# Patient Record
Sex: Male | Born: 1965 | Race: White | Hispanic: No | Marital: Married | Smoking: Never smoker
Health system: Southern US, Community
[De-identification: ages and names within clinical notes are randomized; demographics above are authoritative.]

## PROBLEM LIST (undated history)

## (undated) DIAGNOSIS — E669 Obesity, unspecified: Secondary | ICD-10-CM

## (undated) DIAGNOSIS — I10 Essential (primary) hypertension: Secondary | ICD-10-CM

## (undated) DIAGNOSIS — I5022 Chronic systolic (congestive) heart failure: Secondary | ICD-10-CM

## (undated) DIAGNOSIS — I480 Paroxysmal atrial fibrillation: Secondary | ICD-10-CM

## (undated) DIAGNOSIS — I428 Other cardiomyopathies: Secondary | ICD-10-CM

## (undated) DIAGNOSIS — G4733 Obstructive sleep apnea (adult) (pediatric): Secondary | ICD-10-CM

## (undated) HISTORY — PX: SPLENECTOMY: SUR1306

## (undated) HISTORY — DX: Essential (primary) hypertension: I10

## (undated) HISTORY — DX: Chronic systolic (congestive) heart failure: I50.22

## (undated) HISTORY — DX: Obstructive sleep apnea (adult) (pediatric): G47.33

## (undated) HISTORY — DX: Paroxysmal atrial fibrillation: I48.0

## (undated) HISTORY — PX: APPENDECTOMY: SHX54

## (undated) HISTORY — DX: Other cardiomyopathies: I42.8

## (undated) HISTORY — PX: LIPOMA RESECTION: SHX23

## (undated) HISTORY — DX: Obesity, unspecified: E66.9

## (undated) HISTORY — PX: CARDIAC CATHETERIZATION: SHX172

---

## 2005-05-18 ENCOUNTER — Encounter: Admission: RE | Admit: 2005-05-18 | Discharge: 2005-05-18 | Payer: Self-pay | Admitting: Family Medicine

## 2008-10-29 ENCOUNTER — Emergency Department (HOSPITAL_COMMUNITY): Admission: EM | Admit: 2008-10-29 | Discharge: 2008-10-29 | Payer: Self-pay | Admitting: Emergency Medicine

## 2008-11-20 DIAGNOSIS — I10 Essential (primary) hypertension: Secondary | ICD-10-CM | POA: Insufficient documentation

## 2008-11-21 ENCOUNTER — Ambulatory Visit: Payer: Self-pay | Admitting: Cardiovascular Disease

## 2008-11-21 DIAGNOSIS — R079 Chest pain, unspecified: Secondary | ICD-10-CM

## 2008-12-08 ENCOUNTER — Encounter: Payer: Self-pay | Admitting: Cardiovascular Disease

## 2008-12-08 ENCOUNTER — Ambulatory Visit: Payer: Self-pay | Admitting: Cardiovascular Disease

## 2008-12-08 ENCOUNTER — Ambulatory Visit (HOSPITAL_COMMUNITY): Admission: RE | Admit: 2008-12-08 | Discharge: 2008-12-08 | Payer: Self-pay

## 2008-12-08 ENCOUNTER — Ambulatory Visit: Payer: Self-pay

## 2009-01-10 ENCOUNTER — Ambulatory Visit: Payer: Self-pay | Admitting: Cardiology

## 2009-01-10 ENCOUNTER — Encounter: Payer: Self-pay | Admitting: Physician Assistant

## 2009-01-10 DIAGNOSIS — R9389 Abnormal findings on diagnostic imaging of other specified body structures: Secondary | ICD-10-CM

## 2009-01-17 LAB — CONVERTED CEMR LAB
BUN: 13 mg/dL
CO2: 28 meq/L
Calcium: 8.9 mg/dL
Chloride: 108 meq/L
Creatinine, Ser: 0.9 mg/dL
GFR calc non Af Amer: 97.15 mL/min
Glucose, Bld: 99 mg/dL
Potassium: 3.7 meq/L
Sodium: 142 meq/L

## 2009-01-18 ENCOUNTER — Ambulatory Visit: Payer: Self-pay | Admitting: Cardiovascular Disease

## 2009-01-18 ENCOUNTER — Ambulatory Visit (HOSPITAL_COMMUNITY): Admission: RE | Admit: 2009-01-18 | Discharge: 2009-01-18 | Payer: Self-pay | Admitting: Cardiovascular Disease

## 2009-02-12 ENCOUNTER — Encounter (INDEPENDENT_AMBULATORY_CARE_PROVIDER_SITE_OTHER): Payer: Self-pay | Admitting: *Deleted

## 2010-01-23 ENCOUNTER — Inpatient Hospital Stay (HOSPITAL_COMMUNITY)
Admission: EM | Admit: 2010-01-23 | Discharge: 2010-01-25 | Payer: Self-pay | Source: Home / Self Care | Admitting: Emergency Medicine

## 2010-01-23 ENCOUNTER — Ambulatory Visit: Payer: Self-pay | Admitting: Cardiology

## 2010-01-23 ENCOUNTER — Ambulatory Visit: Payer: Self-pay | Admitting: Family Medicine

## 2010-01-23 ENCOUNTER — Encounter: Payer: Self-pay | Admitting: Family Medicine

## 2010-01-23 DIAGNOSIS — R000 Tachycardia, unspecified: Secondary | ICD-10-CM

## 2010-01-23 DIAGNOSIS — R0602 Shortness of breath: Secondary | ICD-10-CM

## 2010-01-25 ENCOUNTER — Inpatient Hospital Stay (HOSPITAL_COMMUNITY): Admission: EM | Admit: 2010-01-25 | Discharge: 2010-01-29 | Payer: Self-pay | Admitting: Cardiology

## 2010-01-25 ENCOUNTER — Ambulatory Visit: Payer: Self-pay | Admitting: Cardiology

## 2010-01-28 ENCOUNTER — Encounter: Payer: Self-pay | Admitting: Cardiovascular Disease

## 2010-02-01 ENCOUNTER — Telehealth: Payer: Self-pay | Admitting: Cardiovascular Disease

## 2010-02-04 ENCOUNTER — Telehealth: Payer: Self-pay | Admitting: Cardiovascular Disease

## 2010-02-05 ENCOUNTER — Ambulatory Visit: Payer: Self-pay | Admitting: Cardiovascular Disease

## 2010-02-06 LAB — CONVERTED CEMR LAB
CO2: 27 meq/L (ref 19–32)
Calcium: 8.8 mg/dL (ref 8.4–10.5)
Chloride: 107 meq/L (ref 96–112)
Pro B Natriuretic peptide (BNP): 784.9 pg/mL — ABNORMAL HIGH (ref 0.0–100.0)
Sodium: 140 meq/L (ref 135–145)

## 2010-02-07 ENCOUNTER — Telehealth: Payer: Self-pay | Admitting: Cardiovascular Disease

## 2010-02-07 DIAGNOSIS — I4892 Unspecified atrial flutter: Secondary | ICD-10-CM | POA: Insufficient documentation

## 2010-02-08 ENCOUNTER — Ambulatory Visit: Payer: Self-pay | Admitting: Cardiovascular Disease

## 2010-02-08 DIAGNOSIS — I509 Heart failure, unspecified: Secondary | ICD-10-CM | POA: Insufficient documentation

## 2010-02-10 LAB — CONVERTED CEMR LAB
GFR calc non Af Amer: 75.9 mL/min (ref >60)
Glucose, Bld: 61 mg/dL — ABNORMAL LOW (ref 70–99)
Potassium: 3.3 meq/L — ABNORMAL LOW (ref 3.5–5.1)
Sodium: 140 meq/L (ref 135–145)

## 2010-02-12 ENCOUNTER — Telehealth: Payer: Self-pay | Admitting: Cardiovascular Disease

## 2010-03-08 ENCOUNTER — Ambulatory Visit: Payer: Self-pay | Admitting: Cardiovascular Disease

## 2010-03-08 LAB — CONVERTED CEMR LAB
CO2: 33 meq/L — ABNORMAL HIGH (ref 19–32)
Calcium: 9.9 mg/dL (ref 8.4–10.5)
Chloride: 97 meq/L (ref 96–112)
Creatinine, Ser: 1.1 mg/dL (ref 0.4–1.5)
Glucose, Bld: 110 mg/dL — ABNORMAL HIGH (ref 70–99)

## 2010-03-12 ENCOUNTER — Telehealth: Payer: Self-pay | Admitting: Cardiovascular Disease

## 2010-03-15 ENCOUNTER — Other Ambulatory Visit: Payer: Self-pay | Admitting: Cardiology

## 2010-03-15 ENCOUNTER — Ambulatory Visit: Admission: RE | Admit: 2010-03-15 | Discharge: 2010-03-15 | Payer: Self-pay | Source: Home / Self Care

## 2010-03-15 LAB — BASIC METABOLIC PANEL
BUN: 29 mg/dL — ABNORMAL HIGH (ref 6–23)
CO2: 33 mEq/L — ABNORMAL HIGH (ref 19–32)
Calcium: 9.3 mg/dL (ref 8.4–10.5)
Chloride: 94 mEq/L — ABNORMAL LOW (ref 96–112)
Creatinine, Ser: 1.3 mg/dL (ref 0.4–1.5)
GFR: 64.36 mL/min (ref >60.00)
Glucose, Bld: 116 mg/dL — ABNORMAL HIGH (ref 70–99)
Potassium: 2.7 mEq/L — CL (ref 3.5–5.1)
Sodium: 139 mEq/L (ref 135–145)

## 2010-03-15 LAB — MAGNESIUM: Magnesium: 2.2 mg/dL (ref 1.5–2.5)

## 2010-03-19 ENCOUNTER — Ambulatory Visit
Admission: RE | Admit: 2010-03-19 | Discharge: 2010-03-19 | Payer: Self-pay | Source: Home / Self Care | Attending: Cardiovascular Disease | Admitting: Cardiovascular Disease

## 2010-03-19 ENCOUNTER — Other Ambulatory Visit: Payer: Self-pay | Admitting: Cardiovascular Disease

## 2010-03-19 LAB — BASIC METABOLIC PANEL
BUN: 28 mg/dL — ABNORMAL HIGH (ref 6–23)
CO2: 33 mEq/L — ABNORMAL HIGH (ref 19–32)
Calcium: 9.7 mg/dL (ref 8.4–10.5)
Chloride: 98 mEq/L (ref 96–112)
Creatinine, Ser: 1.1 mg/dL (ref 0.4–1.5)
GFR: 74.31 mL/min (ref >60.00)
Glucose, Bld: 84 mg/dL (ref 70–99)
Potassium: 3.4 mEq/L — ABNORMAL LOW (ref 3.5–5.1)
Sodium: 140 mEq/L (ref 135–145)

## 2010-04-01 ENCOUNTER — Telehealth: Payer: Self-pay | Admitting: Cardiovascular Disease

## 2010-04-08 ENCOUNTER — Ambulatory Visit
Admission: RE | Admit: 2010-04-08 | Discharge: 2010-04-08 | Payer: Self-pay | Source: Home / Self Care | Attending: Physician Assistant | Admitting: Physician Assistant

## 2010-04-08 ENCOUNTER — Telehealth (INDEPENDENT_AMBULATORY_CARE_PROVIDER_SITE_OTHER): Payer: Self-pay | Admitting: *Deleted

## 2010-04-08 ENCOUNTER — Other Ambulatory Visit: Payer: Self-pay | Admitting: Physician Assistant

## 2010-04-08 ENCOUNTER — Encounter: Payer: Self-pay | Admitting: Physician Assistant

## 2010-04-08 DIAGNOSIS — R5381 Other malaise: Secondary | ICD-10-CM | POA: Insufficient documentation

## 2010-04-08 DIAGNOSIS — R5383 Other fatigue: Secondary | ICD-10-CM

## 2010-04-08 DIAGNOSIS — I509 Heart failure, unspecified: Secondary | ICD-10-CM | POA: Insufficient documentation

## 2010-04-08 LAB — BASIC METABOLIC PANEL
CO2: 33 mEq/L — ABNORMAL HIGH (ref 19–32)
Chloride: 93 mEq/L — ABNORMAL LOW (ref 96–112)
Creatinine, Ser: 1.2 mg/dL (ref 0.4–1.5)
Potassium: 2.6 mEq/L — CL (ref 3.5–5.1)

## 2010-04-08 LAB — CBC WITH DIFFERENTIAL/PLATELET
Basophils Relative: 0.6 % (ref 0.0–3.0)
Eosinophils Relative: 4.9 % (ref 0.0–5.0)
HCT: 43.3 % (ref 39.0–52.0)
Hemoglobin: 14.9 g/dL (ref 13.0–17.0)
Lymphs Abs: 4 10*3/uL (ref 0.7–4.0)
MCV: 85.2 fl (ref 78.0–100.0)
Monocytes Absolute: 1.7 10*3/uL — ABNORMAL HIGH (ref 0.1–1.0)
Neutro Abs: 10.2 10*3/uL — ABNORMAL HIGH (ref 1.4–7.7)
RBC: 5.07 Mil/uL (ref 4.22–5.81)
WBC: 16.8 10*3/uL — ABNORMAL HIGH (ref 4.5–10.5)

## 2010-04-09 NOTE — Assessment & Plan Note (Signed)
Summary: ROV/DM   History of Present Illness: Art is seen post hospital D/C for new diagnosis of DCM with CHF.  He presented with PAF and CHF.  Cath showed no CAD.  LVEDP was very elevated at 31 with PA pressure of 44/27 and PCWP of 24.  Last office visit had progressive CHF and zaroxyln added with impressive 19 lb weight loss and resolution of dyspnea, PND and orthopnea.  Needs F/U BMET and BNP today.  Will then decide on continuing lasix with zaroxyln 3x/week or changing to demedex.  Keep BB and ACE same for now.  There is a family history of amyloid.  Echo not characteristic but will screen for this with F/U MRI in 3 months.    Current Problems (verified): 1)  CHF  (ICD-428.0) 2)  Unspecified Tachycardia  (ICD-785.0) 3)  Chest Pain Unspecified  (ICD-786.50) 4)  Hypertension  (ICD-401.9) 5)  Atrial Flutter  (ICD-427.32) 6)  Dyspnea  (ICD-786.05) 7)  Abnormal Echocardiogram  (ICD-793.2)  Current Medications (verified): 1)  Aspirin Ec 325 Mg Tbec (Aspirin) .... Take One Tablet By Mouth Daily 2)  Furosemide 40 Mg Tabs (Furosemide) .... Take One Tablet By Mouth Two Times A Day 3)  Carvedilol 25 Mg Tabs (Carvedilol) .... Take One Tablet By Mouth Twice A Day 4)  Cozaar 25 Mg Tabs (Losartan Potassium) .Marland Kitchen.. 1 Tab By Mouth Once Daily 5)  Benzonatate 100 Mg Caps (Benzonatate) .... As Needed 6)  Ibuprofen 200 Mg Tabs (Ibuprofen) .... As Needed 7)  Klor-Con M20 20 Meq Cr-Tabs (Potassium Chloride Crys Cr) .... One Tablet By Mouth Once Daily 8)  Zaroxolyn 5 Mg Tabs (Metolazone) .... One Tablet By Mouth 30 Min Prior To Furosemide Once Daily  Allergies (verified): No Known Drug Allergies  Past History:  Past Medical History: Last updated: 02/05/2010 HTN Chest Pain Blood transfusion Chronic Bronchitis  Past Surgical History: Last updated: 02/07/2010  A 6-French four-curved Judkins right and left coronary catheters, 6-French pigtail ventriculographic catheter, 7-French Swan-  Ganz  thermodilution catheter with subsequent Angio-Seal of the right  femoral artery.  SNT/MEDQ  D:  01/28/2010  T:  01/29/2010  Job:  [id number]  cc:   Noralyn Pick. Eden Emms, MD, [facility]  Electronically Signed by Roger Shelter M.D. 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   Vital Signs:  Patient profile:   45 year old male Height:      76 inches Weight:      281 pounds BMI:     34.33 Pulse rate:   90 / minute Resp:     14 per minute BP sitting:   105 / 71  (left arm)  Vitals Entered By: Kem Parkinson (February 08, 2010 10:00 AM)  Physical Exam  General:  Affect appropriate Healthy:  appears stated age HEENT: normal Neck supple with no adenopathy JVP normal no bruits no thyromegaly Lungs clear with no wheezing and good diaphragmatic motion Heart:  S1/S2 no murmur,rub, gallop or click PMI normal Abdomen: benighn, BS positve, no tenderness, no AAA no bruit.  No HSM or HJR Distal  pulses intact with no bruits No edema Neuro non-focal Skin warm and dry    Impression & Recommendations:  Problem # 1:  CHF (ICD-428.0) Improved  labs today and then decide on diuretic regiman His updated medication list for this problem includes:    Aspirin Ec 325 Mg Tbec (Aspirin) .Marland Kitchen... Take one tablet by mouth daily    Furosemide 40 Mg Tabs (Furosemide) .Marland Kitchen... Take one tablet by mouth two times a day    Carvedilol 25 Mg Tabs (Carvedilol) .Marland Kitchen... Take one tablet by mouth twice a day    Cozaar 25  Mg Tabs (Losartan potassium) .Marland Kitchen... 1 tab by mouth once daily    Zaroxolyn 5 Mg Tabs (Metolazone) ..... One tablet by mouth 30 min prior to furosemide once daily  Orders: TLB-BNP (B-Natriuretic Peptide) (83880-BNPR) TLB-BMP (Basic Metabolic Panel-BMET) (80048-METABOL)  Problem # 2:  HYPERTENSION (ICD-401.9) Resolved no low on Rx for CHF His updated medication list for this problem includes:    Aspirin Ec 325 Mg Tbec (Aspirin) .Marland Kitchen... Take one tablet by mouth daily    Furosemide 40 Mg Tabs (Furosemide) .Marland Kitchen... Take one tablet by mouth two times a day    Carvedilol 25 Mg Tabs (Carvedilol) .Marland Kitchen... Take one tablet by mouth twice a day    Cozaar 25 Mg Tabs (Losartan potassium) .Marland Kitchen... 1 tab by mouth once daily    Zaroxolyn 5 Mg Tabs (Metolazone) ..... One tablet by mouth 30 min prior to furosemide once daily  Problem # 3:  ATRIAL FLUTTER (ICD-427.32) Maint NSR continue ASA and BB His updated medication list for this problem includes:    Aspirin Ec 325 Mg Tbec (Aspirin) .Marland Kitchen... Take one tablet by mouth daily    Carvedilol 25 Mg Tabs (Carvedilol) .Marland Kitchen... Take one tablet by mouth twice a day  Patient Instructions: 1)  Your physician recommends that you schedule a follow-up appointment in: 4 WEEKS WITH DR Laurence Slate 2)  Your physician recommends that you return for lab work in: TODAY BMET BNP 428.0 3)  Your physician recommends that you continue on your current medications as directed. Please refer to the Current Medication list given to you today.  Prevention & Chronic Care Immunizations   Influenza vaccine: Not documented    Tetanus booster: Not documented    Pneumococcal vaccine: Not documented  Other Screening   Smoking status: never  (01/23/2010)  Lipids   Total Cholesterol: Not documented   LDL: Not documented   LDL Direct: Not documented   HDL: Not documented   Triglycerides: Not documented  Hypertension   Last Blood Pressure: 105 / 71  (02/08/2010)   Serum creatinine: 1.3   (02/05/2010)   Serum potassium 4.1  (02/05/2010)  Self-Management Support :    Hypertension self-management support: Not documented

## 2010-04-09 NOTE — Progress Notes (Signed)
Summary: question meds  Phone Note Call from Patient Call back at cell-[phone]   Caller: Patient Reason for Call: Talk to Nurse Summary of Call: pt up five pounds in two days. pt has question re meds. Initial call taken by: Roe Coombs,  February 12, 2010 12:38 PM  Follow-up for Phone Call        spoke with pt, he has gained 5 lbs since friday. he is taking demadex 20mg  two times a day. he has developed a cough and some SOB. discussed with dr Eden Emms, pt instructed to take zaroxolyn today and tomorrow then he will take zaroxolyn everyother day. he will call if symptoms do not improve Deliah Goody, RN  February 12, 2010 5:03 PM

## 2010-04-09 NOTE — Progress Notes (Signed)
Summary: update on med adjustment from weekend**rtn call**  Phone Note Call from Patient   Caller: Patient [phone] Reason for Call: Talk to Nurse Summary of Call: pt called over the weekend as med was adjusted, to call today to let nurse know how he is  doing to see if more adjustment's need to be made Initial call taken by: Glynda Jaeger,  February 04, 2010 10:20 AM  Follow-up for Phone Call        Left message to call back Deliah Goody, RN  February 04, 2010 1:56 PM Pt returned call Judie Grieve  February 04, 2010 2:16 PM   Additional Follow-up for Phone Call Additional follow up Details #1::        pt rtn your call and will not take meds until he hears from you and he needs to talk to you asap Omer Jack  February 04, 2010 3:49 PM   spoke with pt, he cont to have trouble with SOB. he is better some but he still can not lay flat to sleep and is now getting vertigo and dizziness. he has taken 40mg  of lasix this am and was instructed to take 40mg  tonight and then he will see dr Eden Emms tomorrow. Deliah Goody, RN  February 04, 2010 5:46 PM

## 2010-04-09 NOTE — Progress Notes (Signed)
Summary: pt  has med questions  Phone Note Call from Patient   Caller: Patient [phone] Reason for Call: Talk to Nurse Summary of Call: pt has med questions Initial call taken by: Glynda Jaeger,  February 07, 2010 11:23 AM  Follow-up for Phone Call        spoke with pt, he called to report he has lost 19lbs since starting the zaroxolyn. dr Eden Emms talked to pt, pt will hold zaroxolyn for now. he has a follow up appt tomorrow and labs will be checked at that time. Deliah Goody, RN  February 07, 2010 4:00 PM

## 2010-04-09 NOTE — Assessment & Plan Note (Signed)
Summary: eph/edema and sob/dm   CC:  dizziness and sob.  History of Present Illness: [place] is seen post hospital D/C for new diagnosis of DCM with CHF.  He presented with PAF and CHF.  Cath showed no CAD.  LVEDP was very elevated at 31 with PA pressure of 44/27 and PCWP of 24.  He has had increasing SOB despite increasing lasix dose.  He has "vertigo" like symptoms with either his amiodarone or coreg not sure which as he takes both together.  No syncope SSCP.  Increased abdominal girth and edema which may represent ascites.    Discussed with patient and offerred him hospitalization for i.v. Rx  as he is having a hard time sleeping with PND and orthopnea.  He wants to stay out of the hospital.  We will add zaroxyln and keep him at Lasix 40 two times a day with KCL 20 mEq.  He knows to go to ER if any worsening.  We will stop his amiodarone to see if his vertigo is related to this since he is in NSR and it has worse long term side effects.  He was quite tachycardic even in NSR in the hospital and I am hesitant to cut back on his BB although we may have to if his CHF decomensates further.   Current Problems (verified): 1)  Dyspnea  (ICD-786.05) 2)  Unspecified Tachycardia  (ICD-785.0) 3)  Abnormal Echocardiogram  (ICD-793.2) 4)  Chest Pain Unspecified  (ICD-786.50) 5)  Hypertension  (ICD-401.9)  Current Medications (verified): 1)  Aspirin Ec 325 Mg Tbec (Aspirin) .... Take One Tablet By Mouth Daily 2)  Furosemide 40 Mg Tabs (Furosemide) .... Take One Tablet By Mouth Two Times A Day 3)  Carvedilol 25 Mg Tabs (Carvedilol) .... Take One Tablet By Mouth Twice A Day 4)  Cozaar 25 Mg Tabs (Losartan Potassium) .Marland Kitchen.. 1 Tab By Mouth Once Daily 5)  Benzonatate 100 Mg Caps (Benzonatate) .... As Needed 6)  Ibuprofen 200 Mg Tabs (Ibuprofen) .... As Needed 7)  Klor-Con M20 20 Meq Cr-Tabs (Potassium Chloride Crys Cr) .... One Tablet By Mouth Once Daily 8)  Zaroxolyn 5 Mg Tabs (Metolazone) .... 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   Vital Signs:  Patient profile:   45 year old male Height:      76 inches Weight:      299 pounds BMI:     36.53 Pulse rate:   88 / minute Resp:     14 per minute BP sitting:   122 / 80  (left arm)  Vitals Entered By: Kem Parkinson (February 05, 2010 3:45 PM)  Physical Exam  General:  Affect appropriate Healthy:  appears stated age HEENT: normal Neck supple with no adenopathy JVP elevated. no bruits no thyromegaly Lungs rales at base  with no wheezing and good diaphragmatic motion Heart:  S1/S2 S3  no murmur,rub, gallop or click PMI enlarged  Abdomen: benighn, BS positve, no tenderness, no AAA no bruit.  No HSM or HJR Distal pulses intact with no bruits Plus one edema Neuro non-focal Skin warm and dry    Impression & Recommendations:  Problem # 1:  DYSPNEA (ICD-786.05) Worsening CHF despite increased lasix.  Patient declines hospitalizaiton.  Add zaroxyln  BMET and BNP today.  F/U in office Friday Suspect that he has some ascites explaining abdominal fullness.  Will need hospitalization if not better by end of week.  Filling pressures high at cath and prefer diuresis as inotropes may ppt  afib again The following medications were removed from the medication list:    Lisinopril-hydrochlorothiazide 20-12.5 Mg Tabs (Lisinopril-hydrochlorothiazide) .Marland Kitchen... 1 tab by mouth once daily His updated medication list for this problem includes:    Aspirin Ec 325 Mg Tbec (Aspirin) .Marland Kitchen... Take one tablet by mouth daily    Furosemide 40 Mg Tabs (Furosemide) .Marland Kitchen... Take one tablet by mouth two times a day    Carvedilol 25 Mg Tabs (Carvedilol) .Marland Kitchen... Take one tablet by mouth twice a day    Cozaar 25 Mg Tabs (Losartan potassium) .Marland Kitchen... 1 tab by mouth once daily    Zaroxolyn 5 Mg Tabs (Metolazone) ..... One tablet by mouth 30 min prior to furosemide once daily  Orders: TLB-BNP (B-Natriuretic Peptide) (83880-BNPR) TLB-BMP (Basic Metabolic Panel-BMET) (80048-METABOL)  Problem # 2:  HYPERTENSION (ICD-401.9) Well controlled The following medications were removed from the medication list:    Lisinopril-hydrochlorothiazide 20-12.5 Mg Tabs (Lisinopril-hydrochlorothiazide) .Marland Kitchen... 1 tab by mouth once daily His updated medication list for this problem includes:    Aspirin Ec 325 Mg Tbec (Aspirin) .Marland Kitchen... Take one tablet by mouth daily    Furosemide 40 Mg Tabs (Furosemide) .Marland Kitchen... Take one tablet by mouth two times a day    Carvedilol 25 Mg Tabs (Carvedilol) .Marland Kitchen... Take one tablet by mouth twice a day    Cozaar 25 Mg Tabs (Losartan potassium) .Marland Kitchen... 1 tab by mouth once daily    Zaroxolyn 5 Mg Tabs (Metolazone) ..... One tablet by mouth 30 min prior to furosemide once daily  Problem # 3:  UNSPECIFIED TACHYCARDIA (ICD-785.0) PAF ans tachy from low stroke volume.  D/C amiodarone and see if dizzyness improves.  Hopefully not BB as this would be a sign of veyr low CO and right now primary issue appears to be congestion.  Patient Instructions: 1)  Your physician recommends that you schedule a follow-up appointment in: FRIDAY 2)  Your physician has recommended you make the following change in your medication: INCREASE  FUROSEMIDE 40MG  TWICE DAILY 3)  INCREASE POTASSIUM ONCE DAILY 4)  START ZAROXOLYN 5MG  ONCE DAILY 30 MIN PRIOR TO THE FUROSEMIDE 5)  STOP AMIODARONE Prescriptions: ZAROXOLYN 5 MG TABS (METOLAZONE) one tablet by mouth 30 min prior to furosemide once daily  #30 x 12   Entered by:   Deliah Goody, RN   Authorized by:   Colon Branch, MD, [facility]   Signed by:   Deliah Goody, RN on 02/05/2010   Method used:   Electronically to        [facility]  [place] [address] (retail)       [address]       [place], [place]  [address]       Ph: [phone] or [phone]       Fax: [phone]   RxID:   [id number] KLOR-CON M20 20 MEQ CR-TABS (POTASSIUM CHLORIDE CRYS  CR) one tablet by mouth once daily  #30 x 12   Entered by:   Deliah Goody, RN   Authorized by:   Colon Branch, MD, [facility]   Signed by:   Deliah Goody, RN on 02/05/2010   Method used:   [facility] to        [facility]  [place] [address] (retail)       [address]       [place], [place]  [address]       Ph: [phone] or [phone]       Fax: [phone]   RxID:   [id number] FUROSEMIDE 40 MG TABS (FUROSEMIDE) Take one tablet by mouth two times a day  #60 x 12   Entered by:   Deliah Goody, RN   Authorized by:   Colon Branch, MD, [facility]   Signed by:   Deliah Goody, RN on 02/05/2010   Method used:   [facility]        [facility]  [place] [address] [facility]retail[facility]       [address]       [place], [place]  [address]       Ph: [phone] or [phone]       Fax: [phone]   RxID:   [id number]    EKG Report  Procedure date:  02/05/2010  Findings:      SR 88 LAE QT 396 Poor R wave progression

## 2010-04-09 NOTE — Progress Notes (Signed)
Summary: QUESTION RE MED/HOW FEELING  Phone Note Call from Patient   Caller: Patient [phone] Reason for Call: Talk to Nurse Summary of Call: PT HAS QUESTIONS RE MED AND HOW HE IS FEELING Initial call taken by: Glynda Jaeger,  February 01, 2010 3:11 PM  Follow-up for Phone Call        spoke with pt, he is very SOB with little activity. he slept in the recliner last pm.when he checks his bp it reads 100 to 105/70. his bp mechine reads a pulse of 92. he feels heavy in his chest and feels like he is retaining fluid. unable to weigh today batteries dead in scales. denies edema. will discuss with dr katz(DOD). Deliah Goody, RN  February 01, 2010 3:30 PM   Additional Follow-up for Phone Call Additional follow up Details #1::        per dr Myrtis Ser pt instructed to take furosemide 80mg  now and one potassium. saturday am furosemide 80mg  with one extra  potassium and sat pm 40mg  lasix and one extra potassium. sunday 40mg  lasix two times a day with one extra potassium. pt to call on monday to let me know how he is doing. he nows to go to the ER if symptoms do not improve over the weekend Deliah Goody, RN  February 01, 2010 3:39 PM

## 2010-04-09 NOTE — Assessment & Plan Note (Signed)
Summary: new acute only--cough//ccm   Vital Signs:  Patient profile:   45 year old male Height:      76.75 inches Weight:      294 pounds BMI:     35.22 O2 Sat:      94 % on Room air Temp:     98.2 degrees F oral Pulse rate:   100 / minute Resp:     12 per minute BP sitting:   144 / 110  (left arm) Cuff size:   large  Vitals Entered By: Sid Falcon LPN (2010/02/10 3:57 PM)  Nutrition Counseling: Patient's BMI is greater than 25 and therefore counseled on weight management options.  O2 Flow:  Room air CC: Acute New to establish, cough, SOB Is Patient Diabetic? No   History of Present Illness: New patient to establish care.  He presents with acute issue of three-week history of cough productive of clear mucus. He has couple day history of worsening dyspnea possibly worse at rest (supine). He denies any fever, chills, hemoptysis, or any pleuritic pain.  Only mild nasal or sinus congestive symptoms. Nonsmoker. Took some Sudafed earlier today for nasal congestion.  Minimal caffeine use.  Patient had episode last year of bee sting followed by chest pain and abnormal-appearing EKG in the emergency department. Subsequently referred to cardiologist with abnormal stress echo with hypokinesis apical septum left ventricular myocardium. Patient had CT of the heart with normal coronaries and calcium score of zero. No recent exertional chest pain.  Has hypertension treated with ACE inh and mostly compliant with therapy.  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  The patient denies anorexia, fever, weight loss, chest pain, syncope, peripheral edema, prolonged cough, headaches, hemoptysis, abdominal pain, melena, hematochezia, and severe indigestion/heartburn.    Physical Exam  General:  Well-developed,well-nourished,in no acute distress; alert,appropriate and cooperative throughout examination Ears:  External ear exam shows no significant lesions or deformities.  Otoscopic examination reveals clear canals, tympanic membranes are intact bilaterally without bulging, retraction, inflammation or discharge. Hearing is grossly normal bilaterally. Mouth:  Oral mucosa and oropharynx without lesions or exudates.  Teeth in good repair. Neck:  No deformities, masses, or tenderness noted. Lungs:  Normal respiratory effort, chest expands symmetrically. Lungs are clear to auscultation, no crackles or wheezes. Heart:  patient is tachycardic with rate around 150 and regular Abdomen:  soft and non-tender.   Extremities:  no edema Neurologic:  alert & oriented X3.     Impression & Recommendations:  Problem # 1:  UNSPECIFIED TACHYCARDIA (ICD-785.0)  suspect A flutter.  Given rate and progressive dyspnea, rec ER eval by cardiology. history of workup last year with stress echo and CT coronaries. No history of CAD  Orders: EKG w/ Interpretation (93000)  Problem # 2:  DYSPNEA (ICD-786.05) Assessment: New prob sec to #1.  Problem # 3:  HYPERTENSION (ICD-401.9) Assessment: Deteriorated  His updated medication list for this problem  includes:    Lisinopril-hydrochlorothiazide 20-12.5 Mg Tabs (Lisinopril-hydrochlorothiazide) .Marland Kitchen... 1 tab by mouth once daily  Problem # 4:  ABNORMAL ECHOCARDIOGRAM (ICD-793.2) as above.  Complete Medication List: 1)  Lisinopril-hydrochlorothiazide 20-12.5 Mg Tabs (Lisinopril-hydrochlorothiazide) .Marland Kitchen.. 1 tab by mouth once daily 2)  Robaxin 500 Mg Tabs (Methocarbamol) .... As needed  Patient Instructions: 1)  Go directly to [facility] ER and [facility] will further assess there.   Orders Added: 1)  New Patient Level IV [99204] 2)  EKG w/ Interpretation [93000]

## 2010-04-10 ENCOUNTER — Other Ambulatory Visit: Payer: Self-pay

## 2010-04-10 ENCOUNTER — Other Ambulatory Visit: Payer: Self-pay | Admitting: Cardiovascular Disease

## 2010-04-10 ENCOUNTER — Ambulatory Visit (INDEPENDENT_AMBULATORY_CARE_PROVIDER_SITE_OTHER)
Admission: RE | Admit: 2010-04-10 | Discharge: 2010-04-10 | Disposition: A | Discharge status: Home / Self Care | Payer: BC Managed Care – PPO | Source: Ambulatory Visit | Attending: Cardiovascular Disease | Admitting: Cardiovascular Disease

## 2010-04-10 ENCOUNTER — Encounter (INDEPENDENT_AMBULATORY_CARE_PROVIDER_SITE_OTHER): Payer: Self-pay | Admitting: *Deleted

## 2010-04-10 DIAGNOSIS — R079 Chest pain, unspecified: Secondary | ICD-10-CM

## 2010-04-10 DIAGNOSIS — R0989 Other specified symptoms and signs involving the circulatory and respiratory systems: Secondary | ICD-10-CM

## 2010-04-10 DIAGNOSIS — R0600 Dyspnea, unspecified: Secondary | ICD-10-CM

## 2010-04-10 DIAGNOSIS — R0609 Other forms of dyspnea: Secondary | ICD-10-CM

## 2010-04-10 DIAGNOSIS — I509 Heart failure, unspecified: Secondary | ICD-10-CM

## 2010-04-10 DIAGNOSIS — I10 Essential (primary) hypertension: Secondary | ICD-10-CM

## 2010-04-10 LAB — BASIC METABOLIC PANEL
BUN: 21 mg/dL (ref 6–23)
CO2: 32 mEq/L (ref 19–32)
Calcium: 9.1 mg/dL (ref 8.4–10.5)
GFR: 75.84 mL/min (ref >60.00)
Glucose, Bld: 83 mg/dL (ref 70–99)

## 2010-04-11 ENCOUNTER — Other Ambulatory Visit: Payer: Self-pay

## 2010-04-11 ENCOUNTER — Encounter: Payer: Self-pay | Admitting: Physician Assistant

## 2010-04-11 ENCOUNTER — Ambulatory Visit (INDEPENDENT_AMBULATORY_CARE_PROVIDER_SITE_OTHER): Payer: BC Managed Care – PPO | Admitting: Physician Assistant

## 2010-04-11 DIAGNOSIS — I5022 Chronic systolic (congestive) heart failure: Secondary | ICD-10-CM

## 2010-04-11 DIAGNOSIS — R5381 Other malaise: Secondary | ICD-10-CM

## 2010-04-11 NOTE — Progress Notes (Signed)
Summary: question on weight/ Pt. will call back/nm  Phone Note Call from Patient Call back at Work Phone [phone]   Caller: Patient Reason for Call: Talk to Nurse Summary of Call: pt states he having issues with some weight changes.  Initial call taken by: Roe Coombs,  April 01, 2010 10:23 AM  Follow-up for Phone Call        Pt. states he has been having some weight changes. Saturday his weight was 275 lbs. His weigh is usually 270 to 273 lbs. Pt takes Metolazone 5 mg every other day. Today's weight is 274 lbs. He is due to take the Metolazone today. Pt. also states he has been having a wet cough over the weekend. Also pt. states he has been having problems with his B/P.  c/o of light headed when he gets up or moves too [place]. Pt. denies SOB.  He states his wife took his B/P yesterday, but he does not remenber what it was. Pt. said his wife will come home from work about 1:30 Pm. He will call us  with the B/P results  HR 81-82 Follow-up by: Ollen Gross, RN, BSN,  April 01, 2010 12:43 PM  Additional Follow-up for Phone Call Additional follow up Details #1::        Pt calling with B/P readings 104/66 pulse 94 Judie Grieve  April 01, 2010 1:31 PM  Advised patient to continue taking his BP and HR @ home and weighing himself at the same time everyday and to keep a record of this. He notices that he becomes lightheaded sometimes when he stands up too quickly. Advised him to sit on the side of the bed for a few min. before standing. His BP is good @ 104/66 with HR 80s and O2 @ 94%. He is not SOB and does not notice any edema or significant weight gain. Advised him to stay well hydrated and to call us if this continues b/c we may need to bring him in the office to do orthostatic VS. Will forward to Dr. Eden Emms for review. Whitney Maeola Sarah RN  April 01, 2010 2:09 PM\par  Patient is aware of medication changes. He will call back after he looks @ his schedule to make an appt with  the PA either tomorrow and Thursday. Whitney Maeola Sarah RN  April 02, 2010 8:34 AM   Additional Follow-up by: Whitney Maeola Sarah RN,  April 02, 2010 8:34 AM    Additional Follow-up for Phone Call Additional follow up Details #2::    Cut demedex and KCL back to once a day.  Have him see Tereso Newcomer next 48 hrs Follow-up by: Colon Branch, MD, [facility],  April 01, 2010 6:06 PM  patient aware. Whitney Maeola Sarah RN  April 02, 2010 8:35 AM

## 2010-04-11 NOTE — Progress Notes (Signed)
Summary: ok to take tylenol?  Phone Note Call from Patient   Caller: Patient [phone] Reason for Call: Talk to Nurse Summary of Call: pt running a fever-ok to take tylonel? Initial call taken by: Glynda Jaeger,  March 12, 2010 2:57 PM  Follow-up for Phone Call        spoke with pt, questions answered Deliah Goody, RN  March 12, 2010 5:53 PM

## 2010-04-11 NOTE — Miscellaneous (Signed)
Summary: Orders Update  Clinical Lists Changes  Orders: Added new Test order of TLB-BMP (Basic Metabolic Panel-BMET) (80048-METABOL) - Signed 

## 2010-04-11 NOTE — Assessment & Plan Note (Signed)
Summary: 4WK F/U SL   History of Present Illness: Harold Park is seen post hospital D/C for new diagnosis of DCM with CHF.  He presented with PAF and CHF.  Cath showed no CAD.  LVEDP was very elevated at 31 with PA pressure of 44/27 and PCWP of 24.  Last office visit had progressive CHF and zaroxyln added with impressive 19 lb weight loss and resolution of dyspnea, PND and orthopnea.  Needs F/U BMET and BNP today.   Last BNP was down to 165 with BUN 29 and Cr 1.1  Seems to have better response with demedex and zaroxyln than lasix. .  There is a family history of amyloid.  Echo not characteristic but will screen for this with F/U MRI in 3 months.   Continues to have occasional palpitations especially at night.  May need monitor to R/O PAF.  No amiodarone for now  Current Problems (verified): 1)  CHF  (ICD-428.0) 2)  Unspecified Tachycardia  (ICD-785.0) 3)  Chest Pain Unspecified  (ICD-786.50) 4)  Hypertension  (ICD-401.9) 5)  Atrial Flutter  (ICD-427.32) 6)  Dyspnea  (ICD-786.05) 7)  Abnormal Echocardiogram  (ICD-793.2)  Current Medications (verified): 1)  Aspirin Ec 325 Mg Tbec (Aspirin) .... Take One Tablet By Mouth Daily 2)  Demadex 20 Mg Tabs (Torsemide) .Marland Kitchen.. 1 Two Times A Day 3)  Carvedilol 25 Mg Tabs (Carvedilol) .... Take One Tablet By Mouth Twice A Day 4)  Cozaar 25 Mg Tabs (Losartan Potassium) .Marland Kitchen.. 1 Tab By Mouth Once Daily 5)  Benzonatate 100 Mg Caps (Benzonatate) .... As Needed 6)  Ibuprofen 200 Mg Tabs (Ibuprofen) .... As Needed 7)  Klor-Con M20 20 Meq Cr-Tabs (Potassium Chloride Crys Cr) .... 1/2 Tab Once Daily 8)  Zaroxolyn 5 Mg Tabs (Metolazone) .Marland Kitchen.. 1 Tab By Mouth Every Other Day  Allergies (verified): No Known Drug Allergies  Past History:  Past Medical History: Last updated: 01-27-10 HTN Chest Pain Blood transfusion Chronic Bronchitis  Past Surgical History: Last updated: 02/07/2010  A 6-French four-curved Judkins right and left coronary catheters, 6-French pigtail  ventriculographic catheter, 7-French Swan-  Ganz thermodilution catheter with subsequent Angio-Seal of the right  femoral artery.  SNT/MEDQ  D:  01/28/2010  T:  01/29/2010  Job:  [id number]  cc:   Noralyn Pick. Eden Emms, MD, [facility]  Electronically Signed by Roger Shelter M.D. 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   Vital Signs:  Patient profile:   45 year old male Height:      76 inches Weight:      280 pounds BMI:     34.21 Pulse rate:   70 / minute Resp:     14 per minute BP sitting:   109 / 74  (left arm)  Vitals Entered By: Kem Parkinson (March 08, 2010 9:26 AM)  Physical Exam  General:  Affect appropriate Healthy:  appears stated age HEENT: normal Neck supple with no adenopathy JVP normal no bruits no thyromegaly Lungs clear with no wheezing and good diaphragmatic motion Heart:  S1/S2 no murmur,rub, gallop or click PMI normal  Abdomen: benighn, BS positve, no tenderness, no AAA no bruit.  No HSM or HJR Distal pulses intact with no bruits No edema Neuro non-focal Skin warm and dry    Impression & Recommendations:  Problem # 1:  CHF (ICD-428.0) Improved continue current meds.  BNP/BMET today.  F/U 3 months with echo His updated medication list for this problem includes:    Aspirin Ec 325 Mg Tbec (Aspirin) .Marland Kitchen... Take one tablet by mouth daily    Demadex 20 Mg Tabs (Torsemide) .Marland Kitchen... 1 two times a day    Carvedilol 25 Mg Tabs (Carvedilol) .Marland Kitchen... Take one  tablet by mouth twice a day    Cozaar 25 Mg Tabs (Losartan potassium) .Marland Kitchen... 1 tab by mouth once daily    Zaroxolyn 5 Mg Tabs (Metolazone) .Marland Kitchen... 1 tab by mouth every other day  Orders: TLB-BNP (B-Natriuretic Peptide) (83880-BNPR) Echocardiogram (Echo)  Problem # 2:  HYPERTENSION (ICD-401.9) Well controlled His updated medication list for this problem includes:    Aspirin Ec 325 Mg Tbec (Aspirin) .Marland Kitchen... Take one tablet by mouth daily    Demadex 20 Mg Tabs (Torsemide) .Marland Kitchen... 1 two times a day    Carvedilol 25 Mg Tabs (Carvedilol) .Marland Kitchen... Take one tablet by mouth twice a day    Cozaar 25 Mg Tabs (Losartan potassium) .Marland Kitchen... 1 tab by mouth once daily    Zaroxolyn 5 Mg Tabs (Metolazone) .Marland Kitchen... 1 tab by mouth every other day  Problem # 3:  ATRIAL FLUTTER (ICD-427.32) Continue ASA and BB.  Consider event monitor to R/O recurrence.  Would like to avoid amiodarone in someone this young His updated medication list for this problem includes:    Aspirin Ec 325 Mg Tbec (Aspirin) .Marland Kitchen... Take one tablet by mouth daily    Carvedilol 25 Mg Tabs (Carvedilol) .Marland Kitchen... Take one tablet by mouth twice a day  Patient Instructions: 1)  Your physician recommends that you schedule a follow-up appointment in: 3 MONTHS 2)  Your physician has requested that you have an echocardiogram.  Echocardiography is a painless test that uses sound waves to create images of your heart. It provides your doctor with information about the size and shape of your heart and how well your heart's chambers and valves are working.  This procedure takes approximately one hour. There are no restrictions for this procedure. Prescriptions: ZAROXOLYN 5 MG TABS (METOLAZONE) 1 tab by mouth every other day  #90 x 4   Entered by:   Deliah Goody, RN   Authorized by:   Colon Branch, MD, [facility]   Signed by:   Deliah Goody, RN on 03/08/2010   Method used:   Electronically to        [facility]  [place] [address] [facility]retail[facility]       [address]        [place], [place]  [address]       Ph: [phone] or [phone]       Fax: [phone]   RxID:   [id number] KLOR-CON M20 20 MEQ CR-TABS (POTASSIUM CHLORIDE CRYS CR) 1/2 tab once daily  #90 x 4   Entered by:   Deliah Goody, RN   Authorized by:   Colon Branch, MD, [facility]   Signed by:   Deliah Goody, RN on 03/08/2010   Method used:   [facility]ronically [facility]        [facility]  [place] [phone] (retail)       [address]  [place], [place]  [address]       Ph: [phone] or [phone]       Fax: [phone]   RxID:   [id number] COZAAR 25 MG TABS (LOSARTAN POTASSIUM) 1 tab by mouth once daily  #90 x 4   Entered by:   Deliah Goody, RN   Authorized by:   Colon Branch, MD, [facility]   Signed by:   Deliah Goody, RN on 03/08/2010   Method used:   [facility]        [facility]  [place] [address] ([facility]ail[facility]       [address]       [place], [place]  [address]       Ph: [phone] or [phone]       Fax: [phone]   RxID:   [id number] CARVEDILOL 25 MG TABS (CARVEDILOL) Take one tablet by mouth twice a day  #180 x 4   Entered by:   Deliah Goody, RN   Authorized by:   Colon Branch, MD, [facility]   Signed by:   Deliah Goody, RN on 03/08/2010   Method used:   [facility]        [facility]  [place] [address] [facility]       [address]       [place], [place]  [address]       Ph: [phone] or [phone]       Fax: [phone]   RxID:   [id number] DEMADEX 20 MG TABS (TORSEMIDE) 1 two times a day  #180 x 4   Entered by:   Deliah Goody, RN   Authorized by:   Colon Branch, MD, [facility]   Signed by:   Deliah Goody, RN on 03/08/2010   Method used:   [facility]        [facility]  [place] [address] (retail)       [address]       [place], [place]  [address]       Ph: [phone] or [phone]       Fax: [phone]   RxID:   [id number]

## 2010-04-17 NOTE — Progress Notes (Signed)
  DDS Request received sent to [facility]  April 08, 2010 1:54 PM

## 2010-04-17 NOTE — Assessment & Plan Note (Signed)
Summary: f/u as per Tereso Newcomer, PA-C   Vital Signs:  Patient profile:   45 year old male Height:      76 inches Weight:      290 pounds BMI:     35.43 Pulse rate:   85 / minute BP sitting:   109 / 72  (right arm)  Vitals Entered By: Burnett Kanaris, CNA (April 11, 2010 9:56 AM)  Visit Type:  Follow-up Primary Provider:  Dr.[name]  CC:  weight gain.  History of Present Illness: Primary Cardiologist:  Dr. Charlton Haws  Harold Park is a 45 yo male with NICM with EF 30-35%, chronic systolic CHF, parox AFlutter with RVR (01/2010) and HTN.  He had a cath in 01/2010 demonstrating normal cors, EF 25-30% and moderate pulmonary HTN.  Echo done in 01/2010 demonstrated EF 30-35%, mild MR, mild LAE, mild RVE.  He was placed on demadex and metolazone several months ago due to worsening CHF with good improvement.  He called in recenlty with feeling fatigued and  low BPs.  His Demadex was cut back to once daily and he he saw me earlier this week.  I originally asked him to take his demadex two times a day.  But, his labs returned with a high BUN and low K+.  I had him go back to the once daily dose and replaced his K+.  He returns for follow up.  He feels less fatigued.  He is still gaining weight.  He denies shortness of breath.  He denies orthopnea or PND.  Denies chest pain or syncope.  He still has a cough.  His chest x-ray yesterday was negative.  Current Medications (verified): 1)  Aspirin Ec 325 Mg Tbec (Aspirin) .... Take One Tablet By Mouth Daily 2)  Demadex 20 Mg Tabs (Torsemide) .Marland Kitchen.. 1one Time A Day 3)  Carvedilol 25 Mg Tabs (Carvedilol) .... Take One Tablet By Mouth Twice A Day 4)  Cozaar 25 Mg Tabs (Losartan Potassium) .Marland Kitchen.. 1 Tab By Mouth Once Daily 5)  Benzonatate 100 Mg Caps (Benzonatate) .... As Needed 6)  Ibuprofen 200 Mg Tabs (Ibuprofen) .... As Needed 7)  Klor-Con M20 20 Meq Cr-Tabs (Potassium Chloride Crys Cr) .... 2 Tablets One Time A Day 8)  Zaroxolyn 5 Mg Tabs  (Metolazone) .Marland Kitchen.. 1 Tab By Mouth Every Other Day  Allergies (verified): No Known Drug Allergies  Past History:  Past Medical History: Last updated: 04/08/2010 NICM EF 30-35% Chronic Systolic CHF Normal cors at cath 01/2010 Echo 01/25/10: Ef 30-35%; mild MR, mild LAE, mild RVE Parox AFlutter 01/2010 - converted to NSR HTN Chronic Bronchitis  Review of Systems       As per  the HPI.  All other systems reviewed and negative.   Physical Exam  General:  Well nourished, well developed, in no acute distress HEENT: normal Neck: no JVD Cardiac:  normal S1, S2; RRR; no murmur Lungs:  clear to auscultation bilaterally, no wheezing, rhonchi or rales Abd: soft, nontender, no hepatomegaly Ext: no edema Skin: warm and dry Neuro:  CNs 2-12 intact, no focal abnormalities noted    Impression & Recommendations:  Problem # 1:  CHF (ICD-428.0) He was probably over diuresed.  His potassium is still somewhat low.  I will give him an extra 40 mEq of potassium today.  He will continue on the 40 mEq twice a day after that.  He'll have another basic metabolic panel in a week to recheck his K+.  His fatigue may be  related to his medications.  He was also seen by Dr. Eden Emms today.  He suggested we keep him on 25 mg of Coreg in the morning and continue 12.5  mg in the evening.  He will be brought back in followup in 3 months with Dr. Eden Emms.  He should return sooner p.r.n.  Problem # 2:  FATIGUE (ICD-780.79) This may improve with the adjustments in medications.  Problem # 3:  ATRIAL FLUTTER (ICD-427.32) He remains in sinus rhythm by my exam today.  Patient Instructions: 1)  Your physician has recommended you make the following change in your medication:  2)  Change Coreg to 25 mg 1 by mouth in the morning and take 1/2 by mouth in the evening. 3)  Your physician wants you to follow-up in:   You will receive a reminder letter in the mail two months in advance. If you don't receive a letter, please  call our office to schedule the follow-up appointment. 3 months with Dr. Eden Emms. 4)  Today Tereso Newcomer, PA-C would like for you to take an extra potassium TODAY ONLY.

## 2010-04-17 NOTE — Progress Notes (Signed)
Summary: pt advised of repeat BMET and needs CXR.Marland Kitchen [place] office  Phone Note Outgoing Call Call back at Work Phone [phone]   Summary of Call: CMA LMOM of pt work voice mail since his Home # was disconnected. Did leave message to take K+ 60 mEq tonight and not to take his Demadex tonight and to please call me back tomorow after 8:30 am to discuss the other changes. Danielle Rankin, CMA  April 08, 2010 5:26 PM   Follow-up for Phone Call        CMA s/w pt today at work # and discussed that pt needs to have another BMET and have a CXR done 04/10/10 as per Tereso Newcomer, PA-C. Advised pt he can have both of these test done @ our [place] location, pt gave verbal understanding. CMA explained to pt that order will be put in the computer for the BMET and CMA will fax order for CXR to [place] office today. Danielle Rankin, CMA  April 09, 2010 8:54 AM      Appended Document: [facility]     Allergies: No Known Drug Allergies   Other Orders: T-2 View CXR ([id number])

## 2010-04-17 NOTE — Assessment & Plan Note (Signed)
Summary: rov. gd   Visit Type:  Follow-up Primary Provider:  Dr.[name]  CC:  sob, lightheaded, and feels heaviness in chest. .  History of Present Illness: Primary Cardiologist:  Dr. Charlton Haws  Harold Park is a 45 yo male with NICM with EF 30-35%, chronic systolic CHF, parox AFlutter with RVR (01/2010) and HTN.  He had a cath in 01/2010 demonstrating normal cors, EF 25-30% and moderate pulmonary HTN.  Echo done in 01/2010 demonstrated EF 30-35%, mild MR, mild LAE, mild RVE.  He was placed on demadex and metolazone several months ago due to worsening CHF with good improvement.  He called in recenlty with feeling fatigued and  low BPs.  His Demadex was cut back to once daily and he was asked to follow up today with me.  Over the last several weeks, he has felt fatigued and lightheaded.  He denies syncope or near-syncope.  He did have one episode of vertigo recently.  This was classic for him.  He sleeps on 3 or 4 pillows.  This is chronic and has not changed.  He denies PND.  He denies lower extremity edema.  He describes dyspnea with exertion.  This is slightly worse than normal.  He describes NYHA class II-IIb symptoms.  He denies any chest discomfort.  He does note some heaviness in his abdomen and increased girth.  He feels like he is building up with fluid since cutting back on his Demadex.  His weight has gone up 8 pounds at home.  His weight is up 6 pounds by our scales since his last visit.  He has a chronic cough.  This always gets worse when he is building up with fluid.  Current Medications (verified): 1)  Aspirin Ec 325 Mg Tbec (Aspirin) .... Take One Tablet By Mouth Daily 2)  Demadex 20 Mg Tabs (Torsemide) .Marland Kitchen.. 1one Time A Day 3)  Carvedilol 25 Mg Tabs (Carvedilol) .... Take One Tablet By Mouth Twice A Day 4)  Cozaar 25 Mg Tabs (Losartan Potassium) .Marland Kitchen.. 1 Tab By Mouth Once Daily 5)  Benzonatate 100 Mg Caps (Benzonatate) .... As Needed 6)  Ibuprofen 200 Mg Tabs (Ibuprofen)  .... As Needed 7)  Klor-Con M20 20 Meq Cr-Tabs (Potassium Chloride Crys Cr) .... 2 Tablets One Time A Day 8)  Zaroxolyn 5 Mg Tabs (Metolazone) .Marland Kitchen.. 1 Tab By Mouth Every Other Day  Allergies (verified): No Known Drug Allergies  Past History:  Past Medical History: NICM EF 30-35% Chronic Systolic CHF Normal cors at cath 01/2010 Echo 01/25/10: Ef 30-35%; mild MR, mild LAE, mild RVE Parox AFlutter 01/2010 - converted to NSR HTN Chronic Bronchitis  Review of Systems       He denies fevers, chills, melena, hematochezia or hematemesis.  Otherwise, as per  the HPI.  All other systems reviewed and negative.   Vital Signs:  Patient profile:   45 year old male Height:      76 inches Weight:      286.25 pounds BMI:     34.97 Pulse rate:   90 / minute BP sitting:   92 / 62  (right arm)  Vitals Entered By: Celestia Khat, CMA (April 08, 2010 10:33 AM)  Physical Exam  General:  Well nourished, well developed, in no acute distress HEENT: normal Neck: no JVD Cardiac:  normal S1, S2; RRR; no murmur; + S3 Lungs:  clear to auscultation bilaterally, no wheezing, rhonchi or rales Abd: soft, nontender, no hepatomegaly Ext: no edema Skin: warm and  dry Neuro:  CNs 2-12 intact, no focal abnormalities noted    EKG  Procedure date:  04/08/2010  Findings:      normal sinus rhythm Heart rate 90 Leftward axis T-wave inversions in leads 1, aVL, V5-V6 Poor R-wave progression   Impression & Recommendations:  Problem # 1:  CHF (ICD-428.0) He appears to be volume controlled.  However, his weight is up and he feels like he has felt in the past with worsening volume.  He clearly felt better on the higher dose of demadex.  I have asked him to resume demadex at two times a day along with K+ at two times a day.  He will have a bmet and bnp today.  See below.  He will be brought back in close follow up later this week.  Problem # 2:  FATIGUE (ICD-780.79) His BPs are running low.  He may be  experiencing this from his CHF meds.  His Cozaar is as low as it can go.  I will cut back some on his coreg to see if this helps and he will take 12.5 mg in the AM and remain on 25 mg in the PM.  I will also check a TSH and CBC today to be complete.  Orders: TLB-BMP (Basic Metabolic Panel-BMET) (80048-METABOL) TLB-BNP (B-Natriuretic Peptide) (83880-BNPR) TLB-CBC Platelet - w/Differential (85025-CBCD) TLB-TSH (Thyroid Stimulating Hormone) (84443-TSH)  Problem # 3:  ATRIAL FLUTTER (ICD-427.32) He is maintaining NSR.  I do not want to cut back too much on his coreg should he go back out of rhythm.  Therefore, I am only cutting his AM dose in 1/2 to hopefully bring his BP up some.  Orders: EKG w/ Interpretation (93000)  Patient Instructions: 1)  Your physician recommends that you schedule a follow-up appointment in: 04/11/10 with Tereso Newcomer, PA-C. 2)  Your physician recommends that you return for lab work in: Today bmet 401.9, 428.9, 780.79, bnp 780.79, 428.9, cbc 780.79, 428.9, tsh 780.79, 428.9 3)  Your physician recommends that you continue on your current medications as directed. Please refer to the Current Medication list given to you today. Decrease Coreg to 1/2 tablet in the monring and 1 tablet in the evening as per Tereso Newcomer, PA-C. 4)  You may resume your Demadex and Potassium to twice daily as before.

## 2010-04-18 ENCOUNTER — Other Ambulatory Visit: Payer: BC Managed Care – PPO

## 2010-04-22 ENCOUNTER — Telehealth (INDEPENDENT_AMBULATORY_CARE_PROVIDER_SITE_OTHER): Payer: Self-pay | Admitting: *Deleted

## 2010-04-23 ENCOUNTER — Telehealth: Payer: Self-pay | Admitting: Cardiovascular Disease

## 2010-05-01 NOTE — Progress Notes (Signed)
Summary: pt ahs question re pain block  Phone Note Call from Patient   Caller: Patient [phone] Summary of Call: pt to have a pain block in his back this week there anything he needs to let them know Initial call taken by: Glynda Jaeger,  April 23, 2010 11:02 AM  Follow-up for Phone Call        spoke with pt, he is needing an injection in his back and was wanting to make sure okay to have and if the pre-med they give him would cause a problem Deliah Goody, RN  April 23, 2010 2:22 PM]  discussed with dr Eden Emms, okay for injection Deliah Goody, RN  April 23, 2010 3:20 PM\par

## 2010-05-01 NOTE — Progress Notes (Addendum)
  Request received from DDS sent to [facility]  April 22, 2010 1:46 PM      Appended Document:  2nd request from DDS received sent to [facility]

## 2010-05-01 NOTE — Cardiovascular Report (Signed)
Summary: [facility] Cardiac Cath  [facility] Cardiac Cath   Imported By: Earl Many 04/22/2010 15:57:15  _____________________________________________________________________  External Attachment:    Type:   Image     Comment:   External Document

## 2010-05-18 ENCOUNTER — Encounter: Payer: Self-pay | Admitting: Cardiovascular Disease

## 2010-05-21 LAB — CBC
HCT: 43.4 % (ref 39.0–52.0)
HCT: 43.9 % (ref 39.0–52.0)
MCH: 29.1 pg (ref 26.0–34.0)
MCH: 29.1 pg (ref 26.0–34.0)
MCH: 29.4 pg (ref 26.0–34.0)
MCHC: 32.9 g/dL (ref 30.0–36.0)
MCHC: 33.7 g/dL (ref 30.0–36.0)
MCHC: 34.1 g/dL (ref 30.0–36.0)
RDW: 13.6 % (ref 11.5–15.5)
RDW: 13.8 % (ref 11.5–15.5)
RDW: 14.1 % (ref 11.5–15.5)

## 2010-05-21 LAB — LIPID PANEL
Cholesterol: 111 mg/dL (ref 0–200)
HDL: 28 mg/dL — ABNORMAL LOW (ref >39)
Total CHOL/HDL Ratio: 4 RATIO

## 2010-05-21 LAB — DIFFERENTIAL
Eosinophils Absolute: 0.5 10*3/uL (ref 0.0–0.7)
Lymphocytes Relative: 25 % (ref 12–46)
Lymphs Abs: 3.5 10*3/uL (ref 0.7–4.0)
Monocytes Relative: 9 % (ref 3–12)
Neutrophils Relative %: 61 % (ref 43–77)

## 2010-05-21 LAB — BASIC METABOLIC PANEL
BUN: 14 mg/dL (ref 6–23)
BUN: 14 mg/dL (ref 6–23)
BUN: 20 mg/dL (ref 6–23)
CO2: 28 mEq/L (ref 19–32)
Calcium: 8.1 mg/dL — ABNORMAL LOW (ref 8.4–10.5)
Chloride: 107 mEq/L (ref 96–112)
Chloride: 110 mEq/L (ref 96–112)
Chloride: 110 mEq/L (ref 96–112)
GFR calc Af Amer: >60 mL/min (ref >60)
GFR calc Af Amer: >60 mL/min (ref >60)
GFR calc Af Amer: >60 mL/min (ref >60)
GFR calc non Af Amer: >60 mL/min (ref >60)
GFR calc non Af Amer: >60 mL/min (ref >60)
Glucose, Bld: 101 mg/dL — ABNORMAL HIGH (ref 70–99)
Potassium: 3.6 mEq/L (ref 3.5–5.1)
Potassium: 3.7 mEq/L (ref 3.5–5.1)
Potassium: 3.8 mEq/L (ref 3.5–5.1)
Potassium: 3.9 mEq/L (ref 3.5–5.1)
Potassium: 5.7 mEq/L — ABNORMAL HIGH (ref 3.5–5.1)
Sodium: 141 mEq/L (ref 135–145)
Sodium: 141 mEq/L (ref 135–145)

## 2010-05-21 LAB — COMPREHENSIVE METABOLIC PANEL
ALT: 34 U/L (ref 0–53)
CO2: 23 mEq/L (ref 19–32)
Calcium: 8.7 mg/dL (ref 8.4–10.5)
Creatinine, Ser: 1.05 mg/dL (ref 0.4–1.5)
GFR calc Af Amer: >60 mL/min (ref >60)
GFR calc non Af Amer: >60 mL/min (ref >60)
Glucose, Bld: 97 mg/dL (ref 70–99)
Sodium: 140 mEq/L (ref 135–145)
Total Protein: 6 g/dL (ref 6.0–8.3)

## 2010-05-21 LAB — APTT: aPTT: 28 seconds (ref 24–37)

## 2010-05-21 LAB — CARDIAC PANEL(CRET KIN+CKTOT+MB+TROPI)
CK, MB: 1.9 ng/mL (ref 0.3–4.0)
CK, MB: 2 ng/mL (ref 0.3–4.0)
CK, MB: 2 ng/mL (ref 0.3–4.0)
Relative Index: INVALID (ref 0.0–2.5)
Relative Index: INVALID (ref 0.0–2.5)
Relative Index: INVALID (ref 0.0–2.5)
Total CK: 86 U/L (ref 7–232)
Total CK: 97 U/L (ref 7–232)
Troponin I: 0.04 ng/mL (ref 0.00–0.06)
Troponin I: 0.05 ng/mL (ref 0.00–0.06)

## 2010-05-21 LAB — HEMOGLOBIN A1C
Hgb A1c MFr Bld: 5.5 % (ref <5.7)
Mean Plasma Glucose: 111 mg/dL (ref <117)

## 2010-05-21 LAB — CK TOTAL AND CKMB (NOT AT ARMC): Total CK: 105 U/L (ref 7–232)

## 2010-05-21 LAB — POCT I-STAT 3, VENOUS BLOOD GAS (G3P V)
Bicarbonate: 22.6 mEq/L (ref 20.0–24.0)
TCO2: 24 mmol/L (ref 0–100)
pCO2, Ven: 42.2 mmHg — ABNORMAL LOW (ref 45.0–50.0)
pH, Ven: 7.337 — ABNORMAL HIGH (ref 7.250–7.300)
pO2, Ven: 34 mmHg (ref 30.0–45.0)

## 2010-05-21 LAB — POCT I-STAT 3, ART BLOOD GAS (G3+)
Acid-base deficit: 3 mmol/L — ABNORMAL HIGH (ref 0.0–2.0)
Bicarbonate: 21.1 mEq/L (ref 20.0–24.0)
O2 Saturation: 96 %
pO2, Arterial: 78 mmHg — ABNORMAL LOW (ref 80.0–100.0)

## 2010-05-21 LAB — POCT CARDIAC MARKERS: Myoglobin, poc: 76.3 ng/mL (ref 12–200)

## 2010-05-21 LAB — BRAIN NATRIURETIC PEPTIDE
Pro B Natriuretic peptide (BNP): 331 pg/mL — ABNORMAL HIGH (ref 0.0–100.0)
Pro B Natriuretic peptide (BNP): 333 pg/mL — ABNORMAL HIGH (ref 0.0–100.0)

## 2010-05-21 LAB — POCT I-STAT, CHEM 8
Calcium, Ion: 1.18 mmol/L (ref 1.12–1.32)
Glucose, Bld: 99 mg/dL (ref 70–99)
HCT: 45 % (ref 39.0–52.0)
Hemoglobin: 15.3 g/dL (ref 13.0–17.0)

## 2010-05-21 LAB — TSH: TSH: 3.134 u[IU]/mL (ref 0.350–4.500)

## 2010-05-21 LAB — MAGNESIUM: Magnesium: 2 mg/dL (ref 1.5–2.5)

## 2010-05-21 LAB — PROTIME-INR
INR: 1 (ref 0.00–1.49)
Prothrombin Time: 13.4 seconds (ref 11.6–15.2)

## 2010-05-21 LAB — SEDIMENTATION RATE: Sed Rate: 2 mm/hr (ref 0–16)

## 2010-05-31 ENCOUNTER — Other Ambulatory Visit (HOSPITAL_COMMUNITY): Payer: Self-pay | Admitting: Cardiovascular Disease

## 2010-05-31 DIAGNOSIS — I509 Heart failure, unspecified: Secondary | ICD-10-CM

## 2010-06-03 ENCOUNTER — Ambulatory Visit (HOSPITAL_COMMUNITY): Payer: BC Managed Care – PPO | Attending: Cardiovascular Disease | Admitting: Radiology

## 2010-06-03 ENCOUNTER — Ambulatory Visit (INDEPENDENT_AMBULATORY_CARE_PROVIDER_SITE_OTHER): Payer: BC Managed Care – PPO | Admitting: Cardiovascular Disease

## 2010-06-03 ENCOUNTER — Encounter: Payer: Self-pay | Admitting: Cardiovascular Disease

## 2010-06-03 VITALS — BP 100/70 | HR 60 | Ht 77.0 in | Wt 292.0 lb

## 2010-06-03 DIAGNOSIS — I428 Other cardiomyopathies: Secondary | ICD-10-CM

## 2010-06-03 DIAGNOSIS — R5381 Other malaise: Secondary | ICD-10-CM

## 2010-06-03 DIAGNOSIS — I4891 Unspecified atrial fibrillation: Secondary | ICD-10-CM | POA: Insufficient documentation

## 2010-06-03 DIAGNOSIS — I509 Heart failure, unspecified: Secondary | ICD-10-CM | POA: Insufficient documentation

## 2010-06-03 DIAGNOSIS — E669 Obesity, unspecified: Secondary | ICD-10-CM | POA: Insufficient documentation

## 2010-06-03 DIAGNOSIS — I4892 Unspecified atrial flutter: Secondary | ICD-10-CM

## 2010-06-03 DIAGNOSIS — I10 Essential (primary) hypertension: Secondary | ICD-10-CM | POA: Insufficient documentation

## 2010-06-03 DIAGNOSIS — R5383 Other fatigue: Secondary | ICD-10-CM

## 2010-06-03 NOTE — Assessment & Plan Note (Signed)
Well controlled.  Continue current medications and low sodium Dash type diet.    

## 2010-06-03 NOTE — Patient Instructions (Addendum)
You have been referred to CARDIAC REHAB... 6 MONTHS FOLLOW WITH DR. Alvy Bimler CURRENT MEDICATIONS

## 2010-06-03 NOTE — Assessment & Plan Note (Signed)
Labs show resolution of prerenal azotemia.  Diuretics have been backed off.  Continue current dose of meds

## 2010-06-03 NOTE — Progress Notes (Signed)
Harold Park is a 45 yo male with NICM with EF 30-35%, chronic systolic CHF, parox AFlutter with RVR (01/2010) and HTN.  He had a cath in 01/2010 demonstrating normal cors, EF 25-30% and moderate pulmonary HTN.  Echo done in 01/2010 demonstrated EF 30-35%, mild MR, mild LAE, mild RVE.  He was placed on demadex and metolazone several months ago due to worsening CHF with good improvement.  He called in recenlty with feeling fatigued and  low BPs.  His Demadex was cut back to once daily and he he saw me earlier this week.  I originally asked him to take his demadex two times a day.  But, his labs returned with a high BUN and low K+.  I had him go back to the once daily dose and replaced his K+.  He returns for follow up.  Long discussion regarding weight and activity.  Will refer to cardiac rehab.  Discussed low carb diet.  Reviewed echo with him today.  EF improved 40-45% with mild LVE and trivial MR.  Will continue current medical regimen  ROS: Denies fever, malais, weight loss, blurry vision, decreased visual acuity, cough, sputum, SOB, hemoptysis, pleuritic pain, palpitaitons, heartburn, abdominal pain, melena, lower extremity edema, claudication, or rash.   General: Affect appropriate Healthy:  appears stated age HEENT: normal Neck supple with no adenopathy JVP normal no bruits no thyromegaly Lungs clear with no wheezing and good diaphragmatic motion Heart:  S1/S2 no murmur,rub, gallop or click PMI normal Abdomen: benighn, BS positve, no tenderness, no AAA no bruit.  No HSM or HJR Distal pulses intact with no bruits Trace  edema Neuro non-focal Skin warm and dry No muscular weakness   Current Outpatient Prescriptions  Medication Sig Dispense Refill  . aspirin 325 MG tablet Take 325 mg by mouth daily.        . benzonatate (TESSALON) 100 MG capsule Take 100 mg by mouth as needed.        . carvedilol (COREG) 25 MG tablet Take 1 tab in the AM and 1/2 tab at night      . ibuprofen  (ADVIL,MOTRIN) 200 MG tablet Take 200 mg by mouth every 6 (six) hours as needed.        Marland Kitchen KLOR-CON M20 20 MEQ tablet Take 40 mEq by mouth BID times 48H.      Marland Kitchen losartan (COZAAR) 25 MG tablet Take 25 mg by mouth daily.      . metolazone (ZAROXOLYN) 5 MG tablet Take 5 mg by mouth every other day.      . torsemide (DEMADEX) 20 MG tablet Take 20 mg by mouth 2 (two) times daily.         Allergies  Review of patient's allergies indicates not on file.  Electrocardiogram:  Assessment and Plan

## 2010-06-03 NOTE — Assessment & Plan Note (Signed)
Functional class 2.  Cardiac rehab.  Weight loss  Continue current meds

## 2010-06-03 NOTE — Assessment & Plan Note (Signed)
Maint NSR  No recurrence with good Rx of nonischemic DCM

## 2010-06-10 ENCOUNTER — Telehealth: Payer: Self-pay | Admitting: Cardiovascular Disease

## 2010-06-10 NOTE — Telephone Encounter (Signed)
Pt calling re heart rate being up and feeling fatigued [phone]

## 2010-06-10 NOTE — Telephone Encounter (Signed)
Dr Eden Emms aware of pt complaints, pt encouraged to cont to try to push fluids. He will cut back on his fluid pills until able to intake more food. If he cont to have elevated heart rate he will need to come into the office for EKG.

## 2010-06-10 NOTE — Telephone Encounter (Signed)
Spoke with pt, his resting heart rate today is around 106. He developed nausea and has had diarrhea since this morning. He feels he caught a virus from his daughter. His bp is 116/82. He has not been able to eat but has been drinking plenty of fluids. Told pt to cont to watch, will make dr Eden Emms aware.

## 2010-06-15 LAB — POCT I-STAT, CHEM 8
BUN: 13 mg/dL (ref 6–23)
Calcium, Ion: 1.15 mmol/L (ref 1.12–1.32)
Chloride: 105 mEq/L (ref 96–112)
Creatinine, Ser: 1.1 mg/dL (ref 0.4–1.5)
Glucose, Bld: 160 mg/dL — ABNORMAL HIGH (ref 70–99)
HCT: 51 % (ref 39.0–52.0)
Hemoglobin: 17.3 g/dL — ABNORMAL HIGH (ref 13.0–17.0)
Potassium: 3.5 mEq/L (ref 3.5–5.1)
Sodium: 143 mEq/L (ref 135–145)
TCO2: 24 mmol/L (ref 0–100)

## 2010-06-15 LAB — POCT CARDIAC MARKERS
CKMB, poc: 1.1 ng/mL (ref 1.0–8.0)
CKMB, poc: <1 ng/mL — ABNORMAL LOW (ref 1.0–8.0)
Myoglobin, poc: 73.5 ng/mL (ref 12–200)
Myoglobin, poc: 85 ng/mL (ref 12–200)
Troponin i, poc: <0.05 ng/mL (ref 0.00–0.09)

## 2010-06-15 LAB — DIFFERENTIAL
Eosinophils Relative: 1 % (ref 0–5)
Lymphocytes Relative: 27 % (ref 12–46)
Monocytes Absolute: 0.5 10*3/uL (ref 0.1–1.0)
Monocytes Relative: 3 % (ref 3–12)
Neutro Abs: 12.3 10*3/uL — ABNORMAL HIGH (ref 1.7–7.7)

## 2010-06-15 LAB — PROTIME-INR
INR: 0.9 (ref 0.00–1.49)
Prothrombin Time: 12.5 seconds (ref 11.6–15.2)

## 2010-06-15 LAB — CBC
MCHC: 34.3 g/dL (ref 30.0–36.0)
Platelets: 256 10*3/uL (ref 150–400)
RBC: 5.41 MIL/uL (ref 4.22–5.81)
RDW: 13.4 % (ref 11.5–15.5)

## 2010-06-24 ENCOUNTER — Encounter (HOSPITAL_COMMUNITY): Payer: BC Managed Care – PPO

## 2010-06-25 ENCOUNTER — Telehealth: Payer: Self-pay | Admitting: Cardiovascular Disease

## 2010-06-25 NOTE — Telephone Encounter (Signed)
Spoke with pt, he is aware of dr Fabio Bering recommendations Harold Park

## 2010-06-25 NOTE — Telephone Encounter (Signed)
Spoke with pt, he needs to have either a cortisone shot in his hip or prednisone for the pain in his hip. He wants to know which one dr Eden Emms would prefer he do. Will forward for dr Eden Emms review

## 2010-06-25 NOTE — Telephone Encounter (Signed)
Shot better Take extra lasix day of shot

## 2010-06-26 ENCOUNTER — Encounter (HOSPITAL_COMMUNITY): Payer: BC Managed Care – PPO

## 2010-06-28 ENCOUNTER — Encounter (HOSPITAL_COMMUNITY): Payer: BC Managed Care – PPO

## 2010-07-01 ENCOUNTER — Encounter (HOSPITAL_COMMUNITY): Payer: BC Managed Care – PPO

## 2010-07-03 ENCOUNTER — Encounter (HOSPITAL_COMMUNITY): Payer: BC Managed Care – PPO

## 2010-07-05 ENCOUNTER — Encounter (HOSPITAL_COMMUNITY): Payer: BC Managed Care – PPO

## 2010-07-08 ENCOUNTER — Telehealth: Payer: Self-pay | Admitting: Physician Assistant

## 2010-07-08 ENCOUNTER — Other Ambulatory Visit (INDEPENDENT_AMBULATORY_CARE_PROVIDER_SITE_OTHER): Payer: BC Managed Care – PPO | Admitting: *Deleted

## 2010-07-08 ENCOUNTER — Encounter (HOSPITAL_COMMUNITY): Payer: BC Managed Care – PPO | Attending: Cardiovascular Disease

## 2010-07-08 DIAGNOSIS — R000 Tachycardia, unspecified: Secondary | ICD-10-CM | POA: Insufficient documentation

## 2010-07-08 DIAGNOSIS — I4892 Unspecified atrial flutter: Secondary | ICD-10-CM | POA: Insufficient documentation

## 2010-07-08 DIAGNOSIS — Z5189 Encounter for other specified aftercare: Secondary | ICD-10-CM | POA: Insufficient documentation

## 2010-07-08 DIAGNOSIS — I10 Essential (primary) hypertension: Secondary | ICD-10-CM | POA: Insufficient documentation

## 2010-07-08 DIAGNOSIS — I428 Other cardiomyopathies: Secondary | ICD-10-CM | POA: Insufficient documentation

## 2010-07-08 DIAGNOSIS — R0989 Other specified symptoms and signs involving the circulatory and respiratory systems: Secondary | ICD-10-CM | POA: Insufficient documentation

## 2010-07-08 DIAGNOSIS — I509 Heart failure, unspecified: Secondary | ICD-10-CM | POA: Insufficient documentation

## 2010-07-08 DIAGNOSIS — E876 Hypokalemia: Secondary | ICD-10-CM

## 2010-07-08 DIAGNOSIS — R9389 Abnormal findings on diagnostic imaging of other specified body structures: Secondary | ICD-10-CM | POA: Insufficient documentation

## 2010-07-08 DIAGNOSIS — R0609 Other forms of dyspnea: Secondary | ICD-10-CM | POA: Insufficient documentation

## 2010-07-08 DIAGNOSIS — R079 Chest pain, unspecified: Secondary | ICD-10-CM | POA: Insufficient documentation

## 2010-07-08 LAB — BASIC METABOLIC PANEL
BUN: 26 mg/dL — ABNORMAL HIGH (ref 6–23)
Calcium: 8.4 mg/dL (ref 8.4–10.5)
Creatinine, Ser: 1 mg/dL (ref 0.4–1.5)
GFR: 84.48 mL/min (ref >60.00)
Glucose, Bld: 79 mg/dL (ref 70–99)
Potassium: 3.2 mEq/L — ABNORMAL LOW (ref 3.5–5.1)

## 2010-07-08 NOTE — Telephone Encounter (Signed)
Harold Park was concerned, pt w/ Hx hypokalemia, s/p short-term K+ but no recent check. Ordered BMET, pt to have done at ofc. Pt no Sx, keep f/u as planned.

## 2010-07-09 ENCOUNTER — Telehealth: Payer: Self-pay | Admitting: Cardiovascular Disease

## 2010-07-09 NOTE — Telephone Encounter (Signed)
Pt did lab work yesterday due to readings while at cardiac rehab and was wondering if he should continue cardiac rehab or what he needs to do because he has not heard anything

## 2010-07-09 NOTE — Telephone Encounter (Signed)
addressed

## 2010-07-09 NOTE — Telephone Encounter (Signed)
Discussed PVC's with pt, he is aware to continue on with cardiac rehab.  He is aware to  Increase K-Dur to 20 MEQ 1 and 1/2 tablets three times a day.  He is also aware to repeat BMP in 2 months

## 2010-07-10 ENCOUNTER — Encounter (HOSPITAL_COMMUNITY): Payer: BC Managed Care – PPO | Attending: Cardiovascular Disease

## 2010-07-10 DIAGNOSIS — R079 Chest pain, unspecified: Secondary | ICD-10-CM | POA: Insufficient documentation

## 2010-07-10 DIAGNOSIS — R0609 Other forms of dyspnea: Secondary | ICD-10-CM | POA: Insufficient documentation

## 2010-07-10 DIAGNOSIS — R9389 Abnormal findings on diagnostic imaging of other specified body structures: Secondary | ICD-10-CM | POA: Insufficient documentation

## 2010-07-10 DIAGNOSIS — R000 Tachycardia, unspecified: Secondary | ICD-10-CM | POA: Insufficient documentation

## 2010-07-10 DIAGNOSIS — I509 Heart failure, unspecified: Secondary | ICD-10-CM | POA: Insufficient documentation

## 2010-07-10 DIAGNOSIS — I10 Essential (primary) hypertension: Secondary | ICD-10-CM | POA: Insufficient documentation

## 2010-07-10 DIAGNOSIS — I428 Other cardiomyopathies: Secondary | ICD-10-CM | POA: Insufficient documentation

## 2010-07-10 DIAGNOSIS — R0989 Other specified symptoms and signs involving the circulatory and respiratory systems: Secondary | ICD-10-CM | POA: Insufficient documentation

## 2010-07-10 DIAGNOSIS — I4892 Unspecified atrial flutter: Secondary | ICD-10-CM | POA: Insufficient documentation

## 2010-07-10 DIAGNOSIS — Z5189 Encounter for other specified aftercare: Secondary | ICD-10-CM | POA: Insufficient documentation

## 2010-07-12 ENCOUNTER — Encounter (HOSPITAL_COMMUNITY): Payer: BC Managed Care – PPO

## 2010-07-15 ENCOUNTER — Encounter (HOSPITAL_COMMUNITY): Payer: BC Managed Care – PPO

## 2010-07-17 ENCOUNTER — Encounter (HOSPITAL_COMMUNITY): Payer: BC Managed Care – PPO

## 2010-07-19 ENCOUNTER — Encounter (HOSPITAL_COMMUNITY): Payer: BC Managed Care – PPO

## 2010-07-22 ENCOUNTER — Encounter (HOSPITAL_COMMUNITY): Payer: BC Managed Care – PPO

## 2010-07-24 ENCOUNTER — Encounter (HOSPITAL_COMMUNITY): Payer: BC Managed Care – PPO

## 2010-07-24 ENCOUNTER — Telehealth: Payer: Self-pay | Admitting: Physician Assistant

## 2010-07-24 ENCOUNTER — Other Ambulatory Visit (INDEPENDENT_AMBULATORY_CARE_PROVIDER_SITE_OTHER): Payer: BC Managed Care – PPO | Admitting: *Deleted

## 2010-07-24 DIAGNOSIS — I4949 Other premature depolarization: Secondary | ICD-10-CM

## 2010-07-24 NOTE — Telephone Encounter (Signed)
Received call from Cardiac Rehab Nurse, Harold Park that Mr. Harold Park has been having several PVCs at cardiac rehab. He has a history of PVCs, and earlier this month had a BMET drawn showing low K. Subsequent to that, his potassium supplementation was increased. He was not symptomatic and has since left rehab. Will have the pt come to office for a draw for a BMET to check potassium level. Office made aware, spoke with Harold Park to complete order. triage will receive results. Harold Park from Cardiac Rehab has called pt with instructions to come to our office.

## 2010-07-25 ENCOUNTER — Telehealth: Payer: Self-pay | Admitting: Cardiovascular Disease

## 2010-07-25 LAB — BASIC METABOLIC PANEL
CO2: 32 mEq/L (ref 19–32)
Calcium: 9 mg/dL (ref 8.4–10.5)
Creatinine, Ser: 1.1 mg/dL (ref 0.4–1.5)
GFR: 78.18 mL/min (ref >60.00)

## 2010-07-25 NOTE — Telephone Encounter (Signed)
Spoke with pt, per dr taylor(dod), pt instructed to decrease metolazone to 2.5 mg every other day. He was also instructed to increase potassium to 20 meg 2 tablets three times daily. He will have blood work rechecked on Thursday next week Deliah Goody

## 2010-07-25 NOTE — Telephone Encounter (Signed)
Per maria from cardiac rehab- pt k + 2.8. Pt cell phone [phone].

## 2010-07-25 NOTE — Telephone Encounter (Signed)
Left message for pt to call Debra Mathis  

## 2010-07-25 NOTE — Telephone Encounter (Signed)
Pt calling for lab results.

## 2010-07-26 ENCOUNTER — Encounter (HOSPITAL_COMMUNITY): Payer: BC Managed Care – PPO

## 2010-07-29 ENCOUNTER — Encounter (HOSPITAL_COMMUNITY): Payer: BC Managed Care – PPO

## 2010-07-29 ENCOUNTER — Telehealth: Payer: Self-pay | Admitting: Cardiovascular Disease

## 2010-07-29 ENCOUNTER — Other Ambulatory Visit: Payer: Self-pay | Admitting: *Deleted

## 2010-07-29 DIAGNOSIS — I509 Heart failure, unspecified: Secondary | ICD-10-CM

## 2010-07-29 DIAGNOSIS — Z79899 Other long term (current) drug therapy: Secondary | ICD-10-CM

## 2010-07-29 MED ORDER — SPIRONOLACTONE 25 MG PO TABS: 25.0000 mg | ORAL_TABLET | Freq: Every day | ORAL | Status: DC

## 2010-07-29 NOTE — Telephone Encounter (Signed)
Per pt calling,  Should patient continue to take the potassium as prescribe.

## 2010-07-29 NOTE — Telephone Encounter (Signed)
Pt calls to see if he needs to decrease potassium dosage.  He will continue taking 2 tablets tid as instructed last week and return for bmet on 08/26/10. Mylo Red RN

## 2010-07-30 ENCOUNTER — Telehealth: Payer: Self-pay | Admitting: Cardiovascular Disease

## 2010-07-30 ENCOUNTER — Other Ambulatory Visit: Payer: BC Managed Care – PPO | Admitting: *Deleted

## 2010-07-30 DIAGNOSIS — I509 Heart failure, unspecified: Secondary | ICD-10-CM

## 2010-07-30 NOTE — Telephone Encounter (Signed)
Reviewed with Dr. Eden Emms. Per Dr. Eden Emms pt to hold Losartan,demadex and don't start aldactone. He is to continue potassium and come in for BMP and BNP tomorrow. Pt given these instructions and verbalizes understanding.

## 2010-07-30 NOTE — Telephone Encounter (Signed)
Spoke with pt who reports 12 lb wt. Loss over last 48 hours. He weighed 293 lbs on Sunday, 131.4 kg (289 lbs) at cardiac rehab yesterday and today weighs 280 lbs.  Stopped metolazone on 5/19 as instructed. Has not yet started aldactone.  Pt states he has not noticed increased urination. Has no vomiting or loose stools. No change in swelling.  Feels tired today. States he has one or two days a week where he feels bad and today is one of those days.  Blood pressure yesterday was 88/60 at cardiac rehab and then up to 92/64 prior to leaving rehab. Today blood pressure is 104/64.  Will send to Dr. Eden Emms to review and make recommendations as needed.

## 2010-07-30 NOTE — Telephone Encounter (Signed)
Left message to call back  

## 2010-07-30 NOTE — Telephone Encounter (Signed)
Pt called and lost 12 lbs in the last 48 hrs. Pt is concerned with rapid loss of weight,  Feels very tired today.

## 2010-07-31 ENCOUNTER — Other Ambulatory Visit (INDEPENDENT_AMBULATORY_CARE_PROVIDER_SITE_OTHER): Payer: BC Managed Care – PPO | Admitting: *Deleted

## 2010-07-31 ENCOUNTER — Encounter (HOSPITAL_COMMUNITY): Payer: BC Managed Care – PPO

## 2010-07-31 DIAGNOSIS — I509 Heart failure, unspecified: Secondary | ICD-10-CM

## 2010-07-31 LAB — BASIC METABOLIC PANEL
BUN: 30 mg/dL — ABNORMAL HIGH (ref 6–23)
GFR: 70.58 mL/min (ref >60.00)
Potassium: 3.7 mEq/L (ref 3.5–5.1)

## 2010-07-31 LAB — BRAIN NATRIURETIC PEPTIDE: Pro B Natriuretic peptide (BNP): 10 pg/mL (ref 0.0–100.0)

## 2010-08-01 ENCOUNTER — Telehealth: Payer: Self-pay | Admitting: Cardiovascular Disease

## 2010-08-01 NOTE — Telephone Encounter (Signed)
Patient states on 07/30/10, Md discontinue  Losartan, Demadex and Aldactone. He had BMP and BNP on 07/31/10. Patient would like to know what does he needs to do next. Dr. Eden Emms recommends for patient to re-start Losartan , change Demadex to once a day and do not start Aldactone. Patient aware. He verbalized understanding.

## 2010-08-01 NOTE — Telephone Encounter (Signed)
Discuss lab work , next step regarding medication.

## 2010-08-01 NOTE — Telephone Encounter (Signed)
Pt calling re blood work report,thinks needs med change

## 2010-08-01 NOTE — Telephone Encounter (Signed)
Pt aware of results of lab work.  Waiting MD review to see when her should restart his medication

## 2010-08-02 ENCOUNTER — Encounter (HOSPITAL_COMMUNITY): Payer: BC Managed Care – PPO

## 2010-08-05 ENCOUNTER — Encounter (HOSPITAL_COMMUNITY): Payer: BC Managed Care – PPO

## 2010-08-07 ENCOUNTER — Encounter (HOSPITAL_COMMUNITY): Payer: BC Managed Care – PPO

## 2010-08-08 ENCOUNTER — Telehealth: Payer: Self-pay | Admitting: Cardiovascular Disease

## 2010-08-08 NOTE — Telephone Encounter (Signed)
Pt wife states pt weight has gone up to 292 pounds. Pt wife wants to know what does the pt need to do.

## 2010-08-09 ENCOUNTER — Encounter (HOSPITAL_COMMUNITY): Payer: BC Managed Care – PPO | Attending: Cardiovascular Disease

## 2010-08-09 DIAGNOSIS — Z5189 Encounter for other specified aftercare: Secondary | ICD-10-CM | POA: Insufficient documentation

## 2010-08-09 DIAGNOSIS — R9389 Abnormal findings on diagnostic imaging of other specified body structures: Secondary | ICD-10-CM | POA: Insufficient documentation

## 2010-08-09 DIAGNOSIS — R000 Tachycardia, unspecified: Secondary | ICD-10-CM | POA: Insufficient documentation

## 2010-08-09 DIAGNOSIS — I509 Heart failure, unspecified: Secondary | ICD-10-CM | POA: Insufficient documentation

## 2010-08-09 DIAGNOSIS — R079 Chest pain, unspecified: Secondary | ICD-10-CM | POA: Insufficient documentation

## 2010-08-09 DIAGNOSIS — R0989 Other specified symptoms and signs involving the circulatory and respiratory systems: Secondary | ICD-10-CM | POA: Insufficient documentation

## 2010-08-09 DIAGNOSIS — I428 Other cardiomyopathies: Secondary | ICD-10-CM | POA: Insufficient documentation

## 2010-08-09 DIAGNOSIS — I4892 Unspecified atrial flutter: Secondary | ICD-10-CM | POA: Insufficient documentation

## 2010-08-09 DIAGNOSIS — R0609 Other forms of dyspnea: Secondary | ICD-10-CM | POA: Insufficient documentation

## 2010-08-09 DIAGNOSIS — I10 Essential (primary) hypertension: Secondary | ICD-10-CM | POA: Insufficient documentation

## 2010-08-09 NOTE — Telephone Encounter (Signed)
INFORMED PT TO INCREASE TORESMIDE 20 MG  BACK TO 2 TAB A DAY  OVER WEEKEND AND CALL MON WITH UPDATE ALSO INSTRUCTED TO CONT TO WEIGH DAILY AND MAKE SURE WATCHED SALT INTAKE  AND TO MONITOR B/P  INSTRUCTED IF NO IMPROVEMENT IN S/S OVER WEEKEND  TO GO TO NEAREST ER FOR TX PT VERBALIZED UNDERSTANDING./CY

## 2010-08-09 NOTE — Telephone Encounter (Signed)
Patient weight gain is  292. Should patient starts the new diuretic.

## 2010-08-12 ENCOUNTER — Encounter (HOSPITAL_COMMUNITY): Payer: BC Managed Care – PPO

## 2010-08-14 ENCOUNTER — Encounter (HOSPITAL_COMMUNITY): Payer: BC Managed Care – PPO

## 2010-08-16 ENCOUNTER — Encounter (HOSPITAL_COMMUNITY): Payer: BC Managed Care – PPO

## 2010-08-19 ENCOUNTER — Encounter (HOSPITAL_COMMUNITY): Payer: BC Managed Care – PPO

## 2010-08-20 ENCOUNTER — Encounter: Payer: Self-pay | Admitting: Cardiovascular Disease

## 2010-08-21 ENCOUNTER — Encounter (HOSPITAL_COMMUNITY): Payer: BC Managed Care – PPO

## 2010-08-21 ENCOUNTER — Telehealth: Payer: Self-pay | Admitting: Cardiovascular Disease

## 2010-08-21 NOTE — Telephone Encounter (Signed)
Spoke with pt, he has an appt for blood work on Monday. He will hold his tresamide for the next 2 days. He will follow his weight closely and also keep a watch on his bp. He does not want to be seen at present and will call with problems Deliah Goody

## 2010-08-21 NOTE — Telephone Encounter (Signed)
Pt calling to check on appt for tomorrow

## 2010-08-21 NOTE — Telephone Encounter (Signed)
PER PT B/P WAS LOW TODAY IN REHAB  PT STATES TORESMIDE IS COMPLETELY HELD AT THIS TIME. PER DR NISHAN./CY

## 2010-08-23 ENCOUNTER — Encounter (HOSPITAL_COMMUNITY): Payer: BC Managed Care – PPO

## 2010-08-23 ENCOUNTER — Telehealth: Payer: Self-pay | Admitting: Cardiovascular Disease

## 2010-08-23 NOTE — Telephone Encounter (Signed)
Pt weight 134.8 today Wednesday was 132.4.  Lungs are clear. Pt feels heavier. Pt is with maria excerise now Byrd Hesselbach will keep pt until she hears from you.

## 2010-08-23 NOTE — Telephone Encounter (Signed)
Spoke with pt, he will restart his toresamide as taking before. He will monitor his bp and if runs low again will probably need to adjust his coreg Deliah Goody

## 2010-08-26 ENCOUNTER — Encounter (HOSPITAL_COMMUNITY): Payer: BC Managed Care – PPO

## 2010-08-26 ENCOUNTER — Other Ambulatory Visit (INDEPENDENT_AMBULATORY_CARE_PROVIDER_SITE_OTHER): Payer: BC Managed Care – PPO | Admitting: *Deleted

## 2010-08-26 DIAGNOSIS — I509 Heart failure, unspecified: Secondary | ICD-10-CM

## 2010-08-26 DIAGNOSIS — Z79899 Other long term (current) drug therapy: Secondary | ICD-10-CM

## 2010-08-26 LAB — BASIC METABOLIC PANEL
Calcium: 8.7 mg/dL (ref 8.4–10.5)
Chloride: 102 mEq/L (ref 96–112)
Creatinine, Ser: 1 mg/dL (ref 0.4–1.5)
Sodium: 142 mEq/L (ref 135–145)

## 2010-08-28 ENCOUNTER — Encounter (HOSPITAL_COMMUNITY): Payer: BC Managed Care – PPO

## 2010-08-28 ENCOUNTER — Telehealth: Payer: Self-pay | Admitting: Cardiovascular Disease

## 2010-08-28 NOTE — Telephone Encounter (Signed)
Left message for pt of normal lab results    

## 2010-08-28 NOTE — Telephone Encounter (Signed)
Pt calling re blood test results from [facility]

## 2010-08-30 ENCOUNTER — Encounter (HOSPITAL_COMMUNITY): Payer: BC Managed Care – PPO

## 2010-09-02 ENCOUNTER — Encounter (HOSPITAL_COMMUNITY): Payer: BC Managed Care – PPO

## 2010-09-04 ENCOUNTER — Encounter (HOSPITAL_COMMUNITY): Payer: BC Managed Care – PPO

## 2010-09-04 ENCOUNTER — Telehealth: Payer: Self-pay | Admitting: *Deleted

## 2010-09-04 NOTE — Telephone Encounter (Signed)
Harold Park from cardiac rehab states that when pt.arrived to rehab B/P 122/60. During exercise pt c/o of being dizzy B/P 74/60 while standing, 92/60 sitting a little while ago.Harold Park states pt took Zaroxolyn last night. Pt. Denies any symptoms at this time. Pt.  Is to take Zaroxolyn 5 mg every other day. I advised  Harold Park to check pt's orthostatic  to make sure. Harold Park called back and spoke with Harold Park and gave B/P reading. Lying B/P 118/60 102/68 sitting 82/60 standing. According to Harold Park will keep pt for a little while then, she will send him home.

## 2010-09-06 ENCOUNTER — Encounter (HOSPITAL_COMMUNITY): Payer: BC Managed Care – PPO

## 2010-09-09 ENCOUNTER — Encounter: Payer: Self-pay | Admitting: *Deleted

## 2010-09-09 ENCOUNTER — Telehealth: Payer: Self-pay | Admitting: Cardiovascular Disease

## 2010-09-09 ENCOUNTER — Encounter (HOSPITAL_COMMUNITY): Payer: BC Managed Care – PPO | Attending: Cardiovascular Disease

## 2010-09-09 DIAGNOSIS — I509 Heart failure, unspecified: Secondary | ICD-10-CM | POA: Insufficient documentation

## 2010-09-09 DIAGNOSIS — R9389 Abnormal findings on diagnostic imaging of other specified body structures: Secondary | ICD-10-CM | POA: Insufficient documentation

## 2010-09-09 DIAGNOSIS — I428 Other cardiomyopathies: Secondary | ICD-10-CM | POA: Insufficient documentation

## 2010-09-09 DIAGNOSIS — I4892 Unspecified atrial flutter: Secondary | ICD-10-CM | POA: Insufficient documentation

## 2010-09-09 DIAGNOSIS — I10 Essential (primary) hypertension: Secondary | ICD-10-CM | POA: Insufficient documentation

## 2010-09-09 DIAGNOSIS — R0989 Other specified symptoms and signs involving the circulatory and respiratory systems: Secondary | ICD-10-CM | POA: Insufficient documentation

## 2010-09-09 DIAGNOSIS — R000 Tachycardia, unspecified: Secondary | ICD-10-CM | POA: Insufficient documentation

## 2010-09-09 DIAGNOSIS — R079 Chest pain, unspecified: Secondary | ICD-10-CM | POA: Insufficient documentation

## 2010-09-09 DIAGNOSIS — Z5189 Encounter for other specified aftercare: Secondary | ICD-10-CM | POA: Insufficient documentation

## 2010-09-09 DIAGNOSIS — R0609 Other forms of dyspnea: Secondary | ICD-10-CM | POA: Insufficient documentation

## 2010-09-09 NOTE — Telephone Encounter (Signed)
Pt calling back needing letter from Dr. Eden Emms that pt is under his care.

## 2010-09-09 NOTE — Telephone Encounter (Signed)
Spoke with pt, he needs a note that says he is followed here. Will place at the front desk for pt to pick up Harold Park

## 2010-09-11 ENCOUNTER — Encounter (HOSPITAL_COMMUNITY): Payer: BC Managed Care – PPO

## 2010-09-13 ENCOUNTER — Encounter (HOSPITAL_COMMUNITY): Payer: BC Managed Care – PPO

## 2010-09-16 ENCOUNTER — Encounter (HOSPITAL_COMMUNITY): Payer: BC Managed Care – PPO

## 2010-09-18 ENCOUNTER — Encounter (HOSPITAL_COMMUNITY): Payer: BC Managed Care – PPO

## 2010-09-20 ENCOUNTER — Encounter (HOSPITAL_COMMUNITY): Payer: BC Managed Care – PPO

## 2010-09-23 ENCOUNTER — Encounter (HOSPITAL_COMMUNITY): Payer: BC Managed Care – PPO

## 2010-09-25 ENCOUNTER — Encounter (HOSPITAL_COMMUNITY): Payer: BC Managed Care – PPO

## 2010-09-26 ENCOUNTER — Telehealth: Payer: Self-pay | Admitting: Cardiovascular Disease

## 2010-09-26 ENCOUNTER — Encounter: Payer: Self-pay | Admitting: *Deleted

## 2010-09-26 NOTE — Telephone Encounter (Signed)
Spoke with pt, he needs a letter for his mortgage company stating what we are treating him for. He will pick note up tomorrow. Harold Park

## 2010-09-26 NOTE — Telephone Encounter (Signed)
Pt calling re needing a call back about another letter he needs for his mortgage co, that needs to be worded differently

## 2010-09-27 ENCOUNTER — Encounter (HOSPITAL_COMMUNITY): Payer: BC Managed Care – PPO

## 2010-09-30 ENCOUNTER — Encounter (HOSPITAL_COMMUNITY): Payer: BC Managed Care – PPO

## 2010-10-02 ENCOUNTER — Encounter (HOSPITAL_COMMUNITY): Payer: BC Managed Care – PPO

## 2010-10-04 ENCOUNTER — Encounter (HOSPITAL_COMMUNITY): Payer: BC Managed Care – PPO

## 2010-10-07 ENCOUNTER — Encounter (HOSPITAL_COMMUNITY): Payer: BC Managed Care – PPO

## 2010-10-09 ENCOUNTER — Encounter (HOSPITAL_COMMUNITY): Payer: BC Managed Care – PPO | Attending: Cardiovascular Disease

## 2010-10-09 DIAGNOSIS — I4892 Unspecified atrial flutter: Secondary | ICD-10-CM | POA: Insufficient documentation

## 2010-10-09 DIAGNOSIS — R079 Chest pain, unspecified: Secondary | ICD-10-CM | POA: Insufficient documentation

## 2010-10-09 DIAGNOSIS — Z5189 Encounter for other specified aftercare: Secondary | ICD-10-CM | POA: Insufficient documentation

## 2010-10-09 DIAGNOSIS — I509 Heart failure, unspecified: Secondary | ICD-10-CM | POA: Insufficient documentation

## 2010-10-09 DIAGNOSIS — R000 Tachycardia, unspecified: Secondary | ICD-10-CM | POA: Insufficient documentation

## 2010-10-09 DIAGNOSIS — I428 Other cardiomyopathies: Secondary | ICD-10-CM | POA: Insufficient documentation

## 2010-10-09 DIAGNOSIS — R9389 Abnormal findings on diagnostic imaging of other specified body structures: Secondary | ICD-10-CM | POA: Insufficient documentation

## 2010-10-09 DIAGNOSIS — R0989 Other specified symptoms and signs involving the circulatory and respiratory systems: Secondary | ICD-10-CM | POA: Insufficient documentation

## 2010-10-09 DIAGNOSIS — I10 Essential (primary) hypertension: Secondary | ICD-10-CM | POA: Insufficient documentation

## 2010-10-09 DIAGNOSIS — R0609 Other forms of dyspnea: Secondary | ICD-10-CM | POA: Insufficient documentation

## 2010-10-11 ENCOUNTER — Encounter (HOSPITAL_COMMUNITY): Payer: BC Managed Care – PPO

## 2010-10-28 ENCOUNTER — Telehealth: Payer: Self-pay | Admitting: Cardiovascular Disease

## 2010-10-28 NOTE — Telephone Encounter (Signed)
Spoke with pt, he had an episode of increased weight and had to take a metolazone to get relief. The only thing he is currently taking daily is demadex  20mg  bid. He is back to [place] today just has never had that degree of weight gain in that short of time. His bp is running 110/90's. Follow up appt made. Will make dr Eden Emms aware Harold Park

## 2010-10-28 NOTE — Telephone Encounter (Signed)
Per pt call, pt was advised to call if he had any rapid weight gain. Pt said he has had significant weight gain pt said he gained 15 lbs between Thursday and Saturday. Pt said on Saturday pt took metolazone (sp?) and by Sunday pt said weight was back off. Please return pt call to advise/discuss.

## 2010-11-13 ENCOUNTER — Ambulatory Visit: Payer: BC Managed Care – PPO | Admitting: Cardiovascular Disease

## 2010-11-13 NOTE — Telephone Encounter (Signed)
Spoke with pt, for the last several days he has been sick with cold like symptoms. He feels tired today and his bp is elevated. His wife is a Engineer, civil (consulting) and they hooked him up to the monitor in her office and he was having PVC's. His weight is down and he is not SOB. He is concerned about his bp. Will forward for dr Eden Emms review  Deliah Goody

## 2010-11-13 NOTE — Telephone Encounter (Signed)
Patient calling to speak with patient C/O blood pressure today 155/96, hr 102. Also having PVC. Ox 96 %. No chest pain no sob. Feeling malaise.

## 2010-11-15 NOTE — Telephone Encounter (Signed)
Dont worry about BP should normalize after cold improves

## 2010-11-15 NOTE — Telephone Encounter (Signed)
Spoke with pt, aware of dr Fabio Bering recommendations Harold Park

## 2010-11-20 ENCOUNTER — Encounter: Payer: Self-pay | Admitting: Cardiovascular Disease

## 2010-11-20 ENCOUNTER — Telehealth: Payer: Self-pay | Admitting: *Deleted

## 2010-11-20 ENCOUNTER — Ambulatory Visit (INDEPENDENT_AMBULATORY_CARE_PROVIDER_SITE_OTHER): Payer: BC Managed Care – PPO | Admitting: Cardiovascular Disease

## 2010-11-20 DIAGNOSIS — I509 Heart failure, unspecified: Secondary | ICD-10-CM

## 2010-11-20 DIAGNOSIS — F329 Major depressive disorder, single episode, unspecified: Secondary | ICD-10-CM | POA: Insufficient documentation

## 2010-11-20 DIAGNOSIS — F32A Depression, unspecified: Secondary | ICD-10-CM | POA: Insufficient documentation

## 2010-11-20 LAB — BASIC METABOLIC PANEL
CO2: 29 mEq/L (ref 19–32)
Calcium: 8.8 mg/dL (ref 8.4–10.5)
GFR: 78.91 mL/min (ref >60.00)
Sodium: 142 mEq/L (ref 135–145)

## 2010-11-20 LAB — BRAIN NATRIURETIC PEPTIDE: Pro B Natriuretic peptide (BNP): 13 pg/mL (ref 0.0–100.0)

## 2010-11-20 MED ORDER — DULOXETINE HCL 20 MG PO CPEP: 20.0000 mg | ORAL_CAPSULE | Freq: Two times a day (BID) | ORAL | Status: DC

## 2010-11-20 NOTE — Assessment & Plan Note (Signed)
Maint NSR continue current dose of beta blocker

## 2010-11-20 NOTE — Assessment & Plan Note (Signed)
Appears euvolemic  I think weight is fat and not volume overload.  Check BMET and BNP.  Discussed exercise program and low carb diet

## 2010-11-20 NOTE — Assessment & Plan Note (Signed)
Well controlled.  Continue current medications and low sodium Dash type diet.    

## 2010-11-20 NOTE — Patient Instructions (Addendum)
Your physician recommends that you schedule a follow-up appointment in: 3 MONTHS WITH DR [facility] Your physician has recommended you make the following change in your medication: START CYMBALTA 20 MG BID FOR COUPLE OF WEEKS THEN MAY TAKE  BOTH TABS AT THE SAME TIME Your physician recommends that you return for lab work in: TODAY BMET BNP  DX V58.69  428.0

## 2010-11-20 NOTE — Assessment & Plan Note (Signed)
Significant issue and wife concurs.  Start Cymbalta 40mg /day and see if this helps

## 2010-11-20 NOTE — Telephone Encounter (Signed)
LEFT MESSAGE FOR PT TO CALL BACK RE CRITICAL LAB  K 2.4  ON OFFICE NOTE FROM TODAY PT CURRENTLY TAKING KLOR CON 20 MEQ 2 TABS BID AWAITING RETURN CALL TO CONFIRM KCL DOSAGE./CY

## 2010-11-20 NOTE — Progress Notes (Signed)
Harold Park is a 45 yo male with NICM with EF 30-35%, chronic systolic CHF, parox AFlutter with RVR (01/2010) and HTN. He had a cath in 01/2010 demonstrating normal cors, EF 25-30% and moderate pulmonary HTN. Echo done in 01/2010 demonstrated EF 30-35%, mild MR, mild LAE, mild RVE. He was placed on demadex and metolazone several months ago due to worsening CHF with good improvement.   Long discussion regarding weight and activity. Will refer to cardiac rehab. Discussed low carb diet. Reviewed echo with him today. EF improved 40-45% with mild LVE and trivial MR. Will continue current medical regimen  Weight up 292 to 304  Normal CR and K in June with BNP of 10  ROS: Denies fever, malais, weight loss, blurry vision, decreased visual acuity, cough, sputum, SOB, hemoptysis, pleuritic pain, palpitaitons, heartburn, abdominal pain, melena, lower extremity edema, claudication, or rash.  All other systems reviewed and negative  General: Affect appropriate Healthy:  appears stated age HEENT: normal Neck supple with no adenopathy JVP normal no bruits no thyromegaly Lungs clear with no wheezing and good diaphragmatic motion Heart:  S1/S2 no murmur,rub, gallop or click PMI normal Abdomen: benighn, BS positve, no tenderness, no AAA no bruit.  No HSM or HJR Distal pulses intact with no bruits No edema Neuro non-focal Skin warm and dry No muscular weakness   Current Outpatient Prescriptions  Medication Sig Dispense Refill  . aspirin 325 MG tablet Take 325 mg by mouth daily.        . carvedilol (COREG) 25 MG tablet Take 1 tab twice a day      . ibuprofen (ADVIL,MOTRIN) 200 MG tablet Take 200 mg by mouth every 6 (six) hours as needed.        Marland Kitchen losartan (COZAAR) 25 MG tablet Take 25 mg by mouth daily.      . metolazone (ZAROXOLYN) 5 MG tablet Take by mouth. When needed       . NON FORMULARY Over the counter tums  As needed       . potassium chloride SA (KLOR-CON M20) 20 MEQ tablet 2 tabs twice a  day      . torsemide (DEMADEX) 20 MG tablet Take 20 mg by mouth 2 (two) times daily.         Allergies  Review of patient's allergies indicates no known allergies.  Electrocardiogram:  Assessment and Plan

## 2010-11-21 ENCOUNTER — Telehealth: Payer: Self-pay | Admitting: *Deleted

## 2010-11-21 DIAGNOSIS — E876 Hypokalemia: Secondary | ICD-10-CM

## 2010-11-21 NOTE — Telephone Encounter (Signed)
Spoke with pt, his K+ was 2.4, he is taking KCL 20 meg two tabs twice daily. He will take two extra tonight and then increase to two tablets tid and return on Monday for recheck [employer]

## 2010-11-22 ENCOUNTER — Telehealth: Payer: Self-pay | Admitting: *Deleted

## 2010-11-22 NOTE — Telephone Encounter (Signed)
Pt calling stating needs refill on cymbalta but was ordered incorrectly--rx revised to cymbalta 20mg  BID--#180--REFIL LX3---NT

## 2010-11-22 NOTE — Telephone Encounter (Signed)
Per dr Shirleen Schirmer call pt and have him take KCL today---42meq KCL tomorrow and come in for BMET in 2 weeks--per pt--"debra called me yesterday and told me to take 80 meq today and tomorrow and come in for BMET on Monday--i was unable to talk to dr Eden Emms to make him aware of this , but told pt to continue with debra's instructions and come in Monday for BMET---PT AGREES--NT

## 2010-11-25 ENCOUNTER — Other Ambulatory Visit: Payer: BC Managed Care – PPO | Admitting: *Deleted

## 2010-11-25 NOTE — Telephone Encounter (Signed)
NOTE OTHER TELEPHONE MESSAGE .Zack Seal

## 2010-12-02 ENCOUNTER — Telehealth: Payer: Self-pay | Admitting: Cardiovascular Disease

## 2010-12-02 DIAGNOSIS — I509 Heart failure, unspecified: Secondary | ICD-10-CM

## 2010-12-02 DIAGNOSIS — Z79899 Other long term (current) drug therapy: Secondary | ICD-10-CM

## 2010-12-02 NOTE — Telephone Encounter (Signed)
Spoke with pt, he was given the okay to take sudogest. He will have repeat labs tomorrow to check potassium Harold Park

## 2010-12-02 NOTE — Telephone Encounter (Signed)
Pt wants to know can he take  sud jest.

## 2010-12-03 ENCOUNTER — Other Ambulatory Visit (INDEPENDENT_AMBULATORY_CARE_PROVIDER_SITE_OTHER): Payer: BC Managed Care – PPO | Admitting: *Deleted

## 2010-12-03 DIAGNOSIS — E876 Hypokalemia: Secondary | ICD-10-CM

## 2010-12-03 LAB — BASIC METABOLIC PANEL
Chloride: 101 mEq/L (ref 96–112)
Potassium: 3.5 mEq/L (ref 3.5–5.1)

## 2010-12-27 ENCOUNTER — Other Ambulatory Visit: Payer: Self-pay | Admitting: Cardiovascular Disease

## 2010-12-27 ENCOUNTER — Other Ambulatory Visit: Payer: BC Managed Care – PPO | Admitting: *Deleted

## 2010-12-27 ENCOUNTER — Ambulatory Visit: Payer: BC Managed Care – PPO | Admitting: *Deleted

## 2010-12-27 ENCOUNTER — Telehealth: Payer: Self-pay | Admitting: Cardiovascular Disease

## 2010-12-27 ENCOUNTER — Telehealth: Payer: Self-pay | Admitting: *Deleted

## 2010-12-27 DIAGNOSIS — R5383 Other fatigue: Secondary | ICD-10-CM

## 2010-12-27 DIAGNOSIS — E876 Hypokalemia: Secondary | ICD-10-CM

## 2010-12-27 NOTE — Telephone Encounter (Signed)
Pt asking to speak with Stanton Kidney. Please call back.

## 2010-12-27 NOTE — Telephone Encounter (Signed)
Spoke with pt, he has had four days this week of extreme fatigue. He states this is usually how he feels when his potassium is low. He will come to the office today for bmp Deliah Goody

## 2010-12-28 LAB — BASIC METABOLIC PANEL
CO2: 24 mEq/L (ref 19–32)
Chloride: 107 mEq/L (ref 96–112)
Glucose, Bld: 124 mg/dL — ABNORMAL HIGH (ref 70–99)
Potassium: 3.4 mEq/L — ABNORMAL LOW (ref 3.5–5.3)
Sodium: 141 mEq/L (ref 135–145)

## 2010-12-31 ENCOUNTER — Telehealth: Payer: Self-pay | Admitting: Cardiovascular Disease

## 2010-12-31 NOTE — Telephone Encounter (Signed)
Pt aware Dr Eden Emms has not reviewed labs from last week at this time  Did inform pt  k low at 3.4  Encouraged to try to eat a banana a day as well as cont to drink oj.  Currently taking kcl 60 meq bid and demadex 20 mg bid./cy

## 2010-12-31 NOTE — Telephone Encounter (Signed)
Pt called He wants the test results he had on Friday please call back

## 2011-01-10 ENCOUNTER — Telehealth: Payer: Self-pay | Admitting: *Deleted

## 2011-01-10 NOTE — Telephone Encounter (Signed)
Error--nt 

## 2011-01-24 ENCOUNTER — Ambulatory Visit: Payer: BC Managed Care – PPO | Admitting: Cardiovascular Disease

## 2011-02-04 ENCOUNTER — Telehealth: Payer: Self-pay | Admitting: Cardiovascular Disease

## 2011-02-04 ENCOUNTER — Ambulatory Visit (INDEPENDENT_AMBULATORY_CARE_PROVIDER_SITE_OTHER): Payer: BC Managed Care – PPO | Admitting: Family Medicine

## 2011-02-04 ENCOUNTER — Encounter: Payer: Self-pay | Admitting: Family Medicine

## 2011-02-04 VITALS — BP 120/88 | Temp 97.6°F | Wt 302.0 lb

## 2011-02-04 DIAGNOSIS — L259 Unspecified contact dermatitis, unspecified cause: Secondary | ICD-10-CM

## 2011-02-04 MED ORDER — PREDNISONE 10 MG PO TABS: ORAL_TABLET | ORAL | Status: DC

## 2011-02-04 MED ORDER — METHYLPREDNISOLONE ACETATE 80 MG/ML IJ SUSP
80.0000 mg | Freq: Once | INTRAMUSCULAR | Status: AC
  Administered 2011-02-04: 80 mg via INTRAMUSCULAR

## 2011-02-04 NOTE — Progress Notes (Signed)
  Subjective:    Patient ID: Harold Park, male    DOB: 01/19/1966, 45 y.o.   MRN: [id number]  HPI Acute visit. Pruritic rash face upper trunk and upper extremities. Occurred after doing a lot of yard work over the weekend. Denies any fever or chills. History of contact dermatitis in the past. Has not tried topicals. Moderate pruritus.   Review of Systems  Constitutional: Negative for fever and chills.  Skin: Positive for rash.       Objective:   Physical Exam  Constitutional: He appears well-developed and well-nourished.  Cardiovascular: Normal rate and regular rhythm.   Pulmonary/Chest: Effort normal and breath sounds normal. No respiratory distress. He has no wheezes. He has no rales.  Skin:       Erythematous slightly raised rash involving much of the face as well as anterior chest and upper extremities. Non-scaly. Minimally vesicular. Nontender.  Confluent in several areas on face.          Assessment & Plan:  Contact dermatitis. Reviewed options. Depo-Medrol 80 mg IM. Follow with oral prednisone taper if not seeing adequate response with injection.

## 2011-02-04 NOTE — Telephone Encounter (Signed)
Pt calling to talk with nurse, has questions

## 2011-02-05 ENCOUNTER — Telehealth: Payer: Self-pay | Admitting: Family Medicine

## 2011-02-05 NOTE — Telephone Encounter (Signed)
Pt called and said that he still has Poison Oak/Ivy. Pt was given steroid inj yesterday, but pt said that its not helping. Pt is req that Dr Caryl Never called in med that was discussed during ov. Pls call in to [facility] in [place]

## 2011-02-05 NOTE — Telephone Encounter (Signed)
Please advise 

## 2011-02-05 NOTE — Telephone Encounter (Signed)
Pt does have written Rx and will bring to his pharmacy

## 2011-02-05 NOTE — Telephone Encounter (Signed)
Script for prednisone written out yesterday.  If he does not have that rx may call in prednisone taper as written yesterday.

## 2011-02-12 NOTE — Telephone Encounter (Signed)
Lmtcb./cy 

## 2011-02-13 NOTE — Telephone Encounter (Signed)
SPOKE WITH PT,QUESTION ALREADY ADDRESSED.PT WAS STARTED ON STEROID THERAPY  FOR POISON IVY BREAK OUT  IS OUT  ABOUT 10 DAYS  OUT POISON IVY ALMOST GONE PER PT ,HAS APPT WITH DR Eden Emms NEXT WEEK .Harold Park

## 2011-02-20 ENCOUNTER — Ambulatory Visit (INDEPENDENT_AMBULATORY_CARE_PROVIDER_SITE_OTHER): Payer: BC Managed Care – PPO | Admitting: Cardiovascular Disease

## 2011-02-20 ENCOUNTER — Encounter: Payer: Self-pay | Admitting: Cardiovascular Disease

## 2011-02-20 DIAGNOSIS — I4892 Unspecified atrial flutter: Secondary | ICD-10-CM

## 2011-02-20 DIAGNOSIS — I10 Essential (primary) hypertension: Secondary | ICD-10-CM

## 2011-02-20 DIAGNOSIS — I509 Heart failure, unspecified: Secondary | ICD-10-CM

## 2011-02-20 DIAGNOSIS — F329 Major depressive disorder, single episode, unspecified: Secondary | ICD-10-CM

## 2011-02-20 NOTE — Assessment & Plan Note (Signed)
Maint NSR no palpitations 

## 2011-02-20 NOTE — Assessment & Plan Note (Signed)
Improved with exercise program and cymbalta

## 2011-02-20 NOTE — Assessment & Plan Note (Signed)
Euvolemic continue current diuretic regiman.  Weight down 10 lbs

## 2011-02-20 NOTE — Patient Instructions (Signed)
Your physician wants you to follow-up in:  6 months. You will receive a reminder letter in the mail two months in advance. If you don't receive a letter, please call our office to schedule the follow-up appointment.   

## 2011-02-20 NOTE — Assessment & Plan Note (Signed)
Well controlled.  Continue current medications and low sodium Dash type diet.    

## 2011-02-20 NOTE — Progress Notes (Signed)
Patient ID: Stavros Cail, male   DOB: 1965/09/01, 45 y.o.   MRN: [id number] Leighton Brickley is a 45 yo male with NICM with EF 30-35%, chronic systolic CHF, parox AFlutter with RVR (01/2010) and HTN. He had a cath in 01/2010 demonstrating normal cors, EF 25-30% and moderate pulmonary HTN. Echo done in 01/2010 demonstrated EF 30-35%, mild MR, mild LAE, mild RVE. He was placed on demadex and metolazone several months ago due to worsening CHF with good improvement. Long discussion regarding weight and activity. Will refer to cardiac rehab. Discussed low carb diet. Reviewed echo with him today. EF improved 40-45% with mild LVE and trivial MR. Will continue current medical regimen  Weight down to 288 from 292 to 304 Normal CR and K in June with BNP of 10  Recent case of poison ivy Rx with steroids only took one extra dose of zaroxyln for fluid retention  ROS: Denies fever, malais, weight loss, blurry vision, decreased visual acuity, cough, sputum, SOB, hemoptysis, pleuritic pain, palpitaitons, heartburn, abdominal pain, melena, lower extremity edema, claudication, or rash.  All other systems reviewed and negative  General: Affect appropriate Healthy:  appears stated age HEENT: normal Neck supple with no adenopathy JVP normal no bruits no thyromegaly Lungs clear with no wheezing and good diaphragmatic motion Heart:  S1/S2 no murmur,rub, gallop or click PMI normal Abdomen: benighn, BS positve, no tenderness, no AAA no bruit.  No HSM or HJR Distal pulses intact with no bruits No edema Neuro non-focal Skin warm and dry No muscular weakness   Current Outpatient Prescriptions  Medication Sig Dispense Refill  . aspirin 325 MG tablet Take 325 mg by mouth daily.        . carvedilol (COREG) 25 MG tablet Take 1 tab twice a day      . DULoxetine (CYMBALTA) 20 MG capsule Take 20 mg by mouth daily.        Marland Kitchen ibuprofen (ADVIL,MOTRIN) 200 MG tablet Take 200 mg by mouth every 6 (six) hours as needed.          Marland Kitchen losartan (COZAAR) 25 MG tablet Take 25 mg by mouth daily.      . metolazone (ZAROXOLYN) 5 MG tablet Take by mouth. When needed       . NON FORMULARY Over the counter tums  As needed       . potassium chloride SA (KLOR-CON M20) 20 MEQ tablet 3 tabs twice a day per Dr Theodoro Clock      . torsemide (DEMADEX) 20 MG tablet Take 20 mg by mouth 2 (two) times daily.          Allergies  Review of patient's allergies indicates no known allergies.  Electrocardiogram:  Assessment and Plan

## 2011-03-26 ENCOUNTER — Other Ambulatory Visit: Payer: Self-pay | Admitting: Cardiovascular Disease

## 2011-03-26 MED ORDER — LOSARTAN POTASSIUM 25 MG PO TABS: 25.0000 mg | ORAL_TABLET | Freq: Every day | ORAL | Status: DC

## 2011-05-20 ENCOUNTER — Other Ambulatory Visit: Payer: Self-pay | Admitting: Cardiovascular Disease

## 2011-06-04 ENCOUNTER — Other Ambulatory Visit: Payer: Self-pay | Admitting: Cardiovascular Disease

## 2011-06-30 ENCOUNTER — Other Ambulatory Visit: Payer: Self-pay | Admitting: Cardiovascular Disease

## 2011-06-30 MED ORDER — POTASSIUM CHLORIDE CRYS ER 20 MEQ PO TBCR: EXTENDED_RELEASE_TABLET | ORAL | Status: DC

## 2011-08-12 IMAGING — CR DG CHEST 1V PORT
1 series · 1 of 1 positions shown · non-contrast
Comparison: None

CLINICAL DATA: Anaphylactic reaction to Calix stings.  Wheezing.

PORTABLE CHEST - 1 VIEW

[view not recorded]
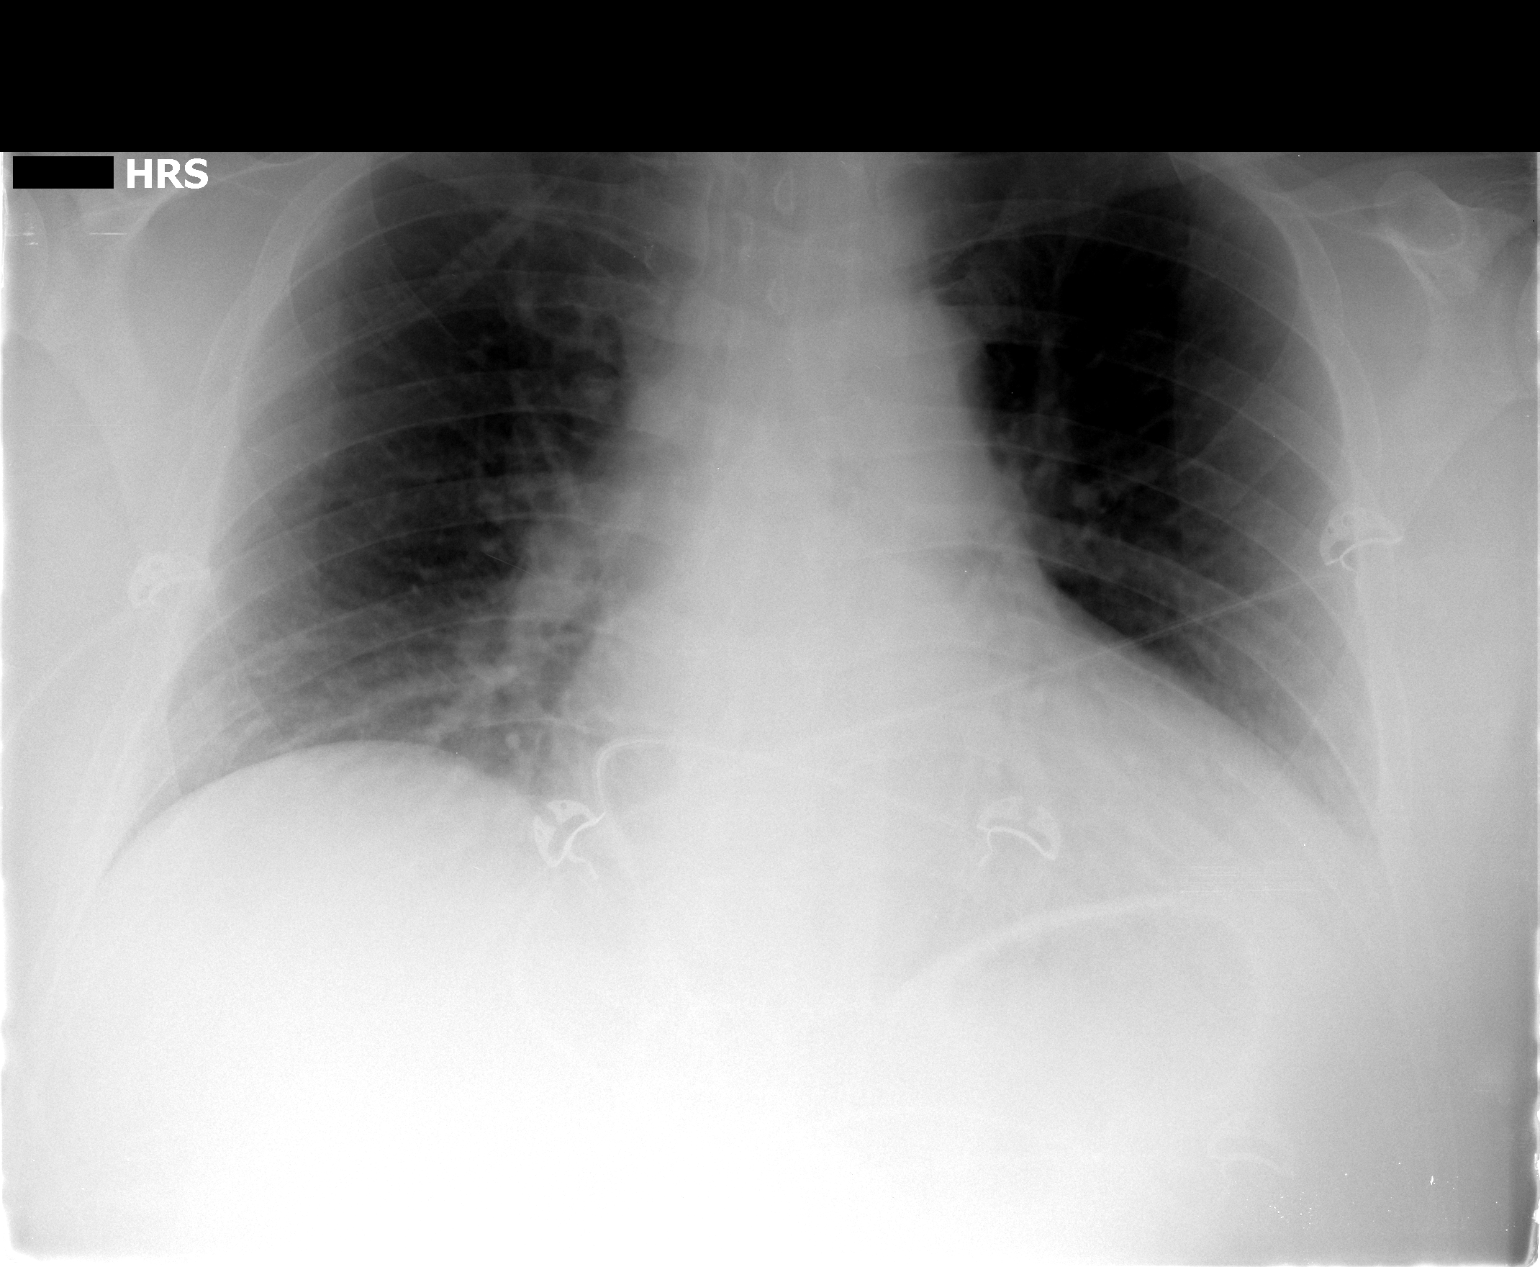

[1 of 1 positions shown; findings below may reference images not displayed]

FINDINGS: Mildly degraded exam due to AP portable technique and
patient body habitus.  Midline trachea. Cardiomegaly accentuated by
AP portable technique.  No right-sided pleural effusion. No
pneumothorax.  Due to overlying soft tissues, the left lung base
and left pleural space are poorly evaluated.  No definite acute
finding.  Probable mild bibasilar atelectasis.
IMPRESSION: 1.  Cardiomegaly without congestive failure.
2. Decreased sensitivity and specificity exam due to technique
related factors, as described above.

## 2011-10-29 ENCOUNTER — Other Ambulatory Visit: Payer: Self-pay | Admitting: Cardiology

## 2011-10-29 MED ORDER — POTASSIUM CHLORIDE CRYS ER 20 MEQ PO TBCR: EXTENDED_RELEASE_TABLET | ORAL | Status: DC

## 2011-11-12 ENCOUNTER — Encounter: Payer: Self-pay | Admitting: Cardiovascular Disease

## 2011-11-12 ENCOUNTER — Ambulatory Visit (INDEPENDENT_AMBULATORY_CARE_PROVIDER_SITE_OTHER): Payer: BC Managed Care – PPO | Admitting: Cardiovascular Disease

## 2011-11-12 VITALS — BP 122/84 | HR 72 | Ht 77.0 in | Wt 294.1 lb

## 2011-11-12 DIAGNOSIS — I4892 Unspecified atrial flutter: Secondary | ICD-10-CM

## 2011-11-12 DIAGNOSIS — I10 Essential (primary) hypertension: Secondary | ICD-10-CM

## 2011-11-12 DIAGNOSIS — I509 Heart failure, unspecified: Secondary | ICD-10-CM

## 2011-11-12 MED ORDER — POTASSIUM CHLORIDE CRYS ER 20 MEQ PO TBCR: EXTENDED_RELEASE_TABLET | ORAL | Status: DC

## 2011-11-12 NOTE — Patient Instructions (Addendum)
Your physician wants you to follow-up in:  6 MONTHS WITH DR NISHAN  You will receive a reminder letter in the mail two months in advance. If you don't receive a letter, please call our office to schedule the follow-up appointment. Your physician recommends that you continue on your current medications as directed. Please refer to the Current Medication list given to you today. 

## 2011-11-12 NOTE — Assessment & Plan Note (Signed)
Well controlled.  Continue current medications and low sodium Dash type diet.    

## 2011-11-12 NOTE — Assessment & Plan Note (Signed)
Maint NSR resume ASA

## 2011-11-12 NOTE — Assessment & Plan Note (Signed)
Euvolemic continue current dose of diuretics

## 2011-11-12 NOTE — Progress Notes (Signed)
Patient ID: Harold Park, male   DOB: 1966/02/04, 46 y.o.   MRN: [id number] Lacy Taglieri is a 46 yo male with NICM with EF 30-35%, chronic systolic CHF, parox AFlutter with RVR (01/2010) and HTN. He had a cath in 01/2010 demonstrating normal cors, EF 25-30% and moderate pulmonary HTN. Echo done in 01/2010 demonstrated EF 30-35%, mild MR, mild LAE, mild RVE. He was placed on demadex and metolazone several months ago due to worsening CHF with good improvement. Long discussion regarding weight and activity. Will refer to cardiac rehab. Discussed low carb diet. Reviewed echo 06/03/10  EF improved 40-45% with mild LVE and trivial MR. Will continue current medical regimen   Weight up form 288 to 284 last visit    ROS: Denies fever, malais, weight loss, blurry vision, decreased visual acuity, cough, sputum, SOB, hemoptysis, pleuritic pain, palpitaitons, heartburn, abdominal pain, melena, lower extremity edema, claudication, or rash.  All other systems reviewed and negative  General: Affect appropriate Healthy:  appears stated age HEENT: normal Neck supple with no adenopathy JVP normal no bruits no thyromegaly Lungs clear with no wheezing and good diaphragmatic motion Heart:  S1/S2 no murmur, no rub, gallop or click PMI normal Abdomen: benighn, BS positve, no tenderness, no AAA no bruit.  No HSM or HJR Distal pulses intact with no bruits No edema Neuro non-focal Skin warm and dry No muscular weakness   Current Outpatient Prescriptions  Medication Sig Dispense Refill  . aspirin 325 MG tablet Take 325 mg by mouth daily.        . carvedilol (COREG) 25 MG tablet TAKE 1 TABLET BY MOUTH TWICE A DAY  180 tablet  2  . DULoxetine (CYMBALTA) 20 MG capsule Take 20 mg by mouth daily.        Marland Kitchen ibuprofen (ADVIL,MOTRIN) 200 MG tablet Take 200 mg by mouth every 6 (six) hours as needed.        Marland Kitchen losartan (COZAAR) 25 MG tablet Take 1 tablet (25 mg total) by mouth daily.  90 tablet  3  . metolazone  (ZAROXOLYN) 5 MG tablet Take by mouth. When needed       . NON FORMULARY Over the counter tums  As needed       . potassium chloride SA (KLOR-CON M20) 20 MEQ tablet 3 tabs twice a day per Dr Theodoro Clock  180 tablet  1  . torsemide (DEMADEX) 20 MG tablet TAKE 1 TABLET BY MOUTH TWICE A DAY  180 tablet  2  . DISCONTD: DULoxetine (CYMBALTA) 20 MG capsule Take 1 capsule (20 mg total) by mouth 2 (two) times daily.  60 capsule  11    Allergies  Review of patient's allergies indicates no known allergies.  Electrocardiogram:  NSR rate 72  ICRBBB  Assessment and Plan

## 2011-12-24 ENCOUNTER — Telehealth: Payer: Self-pay | Admitting: Cardiovascular Disease

## 2011-12-24 NOTE — Telephone Encounter (Signed)
SPOKE WITH PT  IS WANTING TO CUT BACK ON  CARVEDILOL  PT  ATTEMPTED TO DO  YARD WORK AFTER TAKING  AND WAS ONLY ABLE TO  WORK  FOR ABOUT 30 MIN BEFORE TIRING  OUT THE NEXT WEEKEND  SKIPPED THE AM DOSE AND WAS ABLE TO  WORK  FOR 3-4 HOURS BEFORE TIRING OUT   PT NOT SURE WHAT B/P OR HEART WAS  ENCOURAGED TO MONTIOR AND TO CALL BACK NEXT WEEK WITH LOG AND THEN WILL FORWARD TO DR Eden Emms FOR REVIEW  PT HAS ALSO LOST  APRROX  12  LBS SINCE HERE LAST./CY

## 2011-12-24 NOTE — Telephone Encounter (Signed)
New Problem:    Patient called in wanting to know if it would be ok scaling back his beta blocker.  Please call back.

## 2012-01-28 ENCOUNTER — Other Ambulatory Visit: Payer: Self-pay | Admitting: *Deleted

## 2012-03-05 NOTE — Telephone Encounter (Signed)
LMTCB ./CY 

## 2012-04-15 ENCOUNTER — Telehealth: Payer: Self-pay | Admitting: *Deleted

## 2012-04-15 DIAGNOSIS — I509 Heart failure, unspecified: Secondary | ICD-10-CM

## 2012-04-15 DIAGNOSIS — Z79899 Other long term (current) drug therapy: Secondary | ICD-10-CM

## 2012-04-15 NOTE — Telephone Encounter (Signed)
PT AWARE  WILL GET A CALL NEXT WEEK TO SET  UP MRI./CY

## 2012-04-15 NOTE — Telephone Encounter (Signed)
SEE OTHER PHONE NOTE./CY 

## 2012-04-15 NOTE — Telephone Encounter (Signed)
Prefer cardiac MRI to reasses EF history of DCM can schedule for me to read before office appt if aproved

## 2012-04-15 NOTE — Telephone Encounter (Signed)
MADE PT APPT FOR MARCH  PT WANTED TO KNOW IF  HE NEEDED REPEAT ECHO AS WELL./CY

## 2012-04-20 ENCOUNTER — Encounter: Payer: Self-pay | Admitting: Cardiovascular Disease

## 2012-04-30 ENCOUNTER — Ambulatory Visit (HOSPITAL_COMMUNITY): Admission: RE | Admit: 2012-04-30 | Payer: BC Managed Care – PPO | Source: Ambulatory Visit

## 2012-05-19 ENCOUNTER — Telehealth: Payer: Self-pay | Admitting: Cardiovascular Disease

## 2012-05-20 ENCOUNTER — Ambulatory Visit: Payer: BC Managed Care – PPO | Admitting: Cardiovascular Disease

## 2012-06-15 ENCOUNTER — Other Ambulatory Visit: Payer: Self-pay | Admitting: *Deleted

## 2012-06-15 MED ORDER — METOLAZONE 5 MG PO TABS: ORAL_TABLET | ORAL | Status: DC

## 2012-08-25 ENCOUNTER — Telehealth: Payer: Self-pay | Admitting: Cardiovascular Disease

## 2012-08-25 DIAGNOSIS — I509 Heart failure, unspecified: Secondary | ICD-10-CM

## 2012-08-25 NOTE — Telephone Encounter (Signed)
Dr Harlin Rain you want me to re-order a Cardiac MRI that the patient did not have in February 2014  before his office visit with you in March 2014 that he cancelled?

## 2012-08-25 NOTE — Telephone Encounter (Signed)
Yes

## 2012-08-25 NOTE — Telephone Encounter (Signed)
New problem    Patient called in today spoke with [facility] . Wanted to reschedule his cardiac mri from nos 04-30-2012 . Need a new order put into the system.

## 2012-08-25 NOTE — Telephone Encounter (Signed)
Done

## 2012-09-02 ENCOUNTER — Encounter: Payer: Self-pay | Admitting: Cardiovascular Disease

## 2012-09-23 ENCOUNTER — Ambulatory Visit (HOSPITAL_COMMUNITY)
Admission: RE | Admit: 2012-09-23 | Discharge: 2012-09-23 | Disposition: A | Discharge status: Home / Self Care | Payer: BC Managed Care – PPO | Source: Ambulatory Visit | Attending: Cardiovascular Disease | Admitting: Cardiovascular Disease

## 2012-09-23 DIAGNOSIS — I428 Other cardiomyopathies: Secondary | ICD-10-CM | POA: Insufficient documentation

## 2012-09-23 DIAGNOSIS — I509 Heart failure, unspecified: Secondary | ICD-10-CM

## 2012-09-23 LAB — CREATININE, SERUM: Creatinine, Ser: 1.08 mg/dL (ref 0.50–1.35)

## 2012-09-23 MED ORDER — GADOBENATE DIMEGLUMINE 529 MG/ML IV SOLN
40.0000 mL | Freq: Once | INTRAVENOUS | Status: AC
  Administered 2012-09-23: 40 mL via INTRAVENOUS

## 2012-11-05 IMAGING — CR DG CHEST 1V PORT
1 series · 1 of 1 positions shown · non-contrast
Comparison: 10/29/2008

CLINICAL DATA: Shortness of breath.

PORTABLE CHEST - 1 VIEW

[view not recorded]
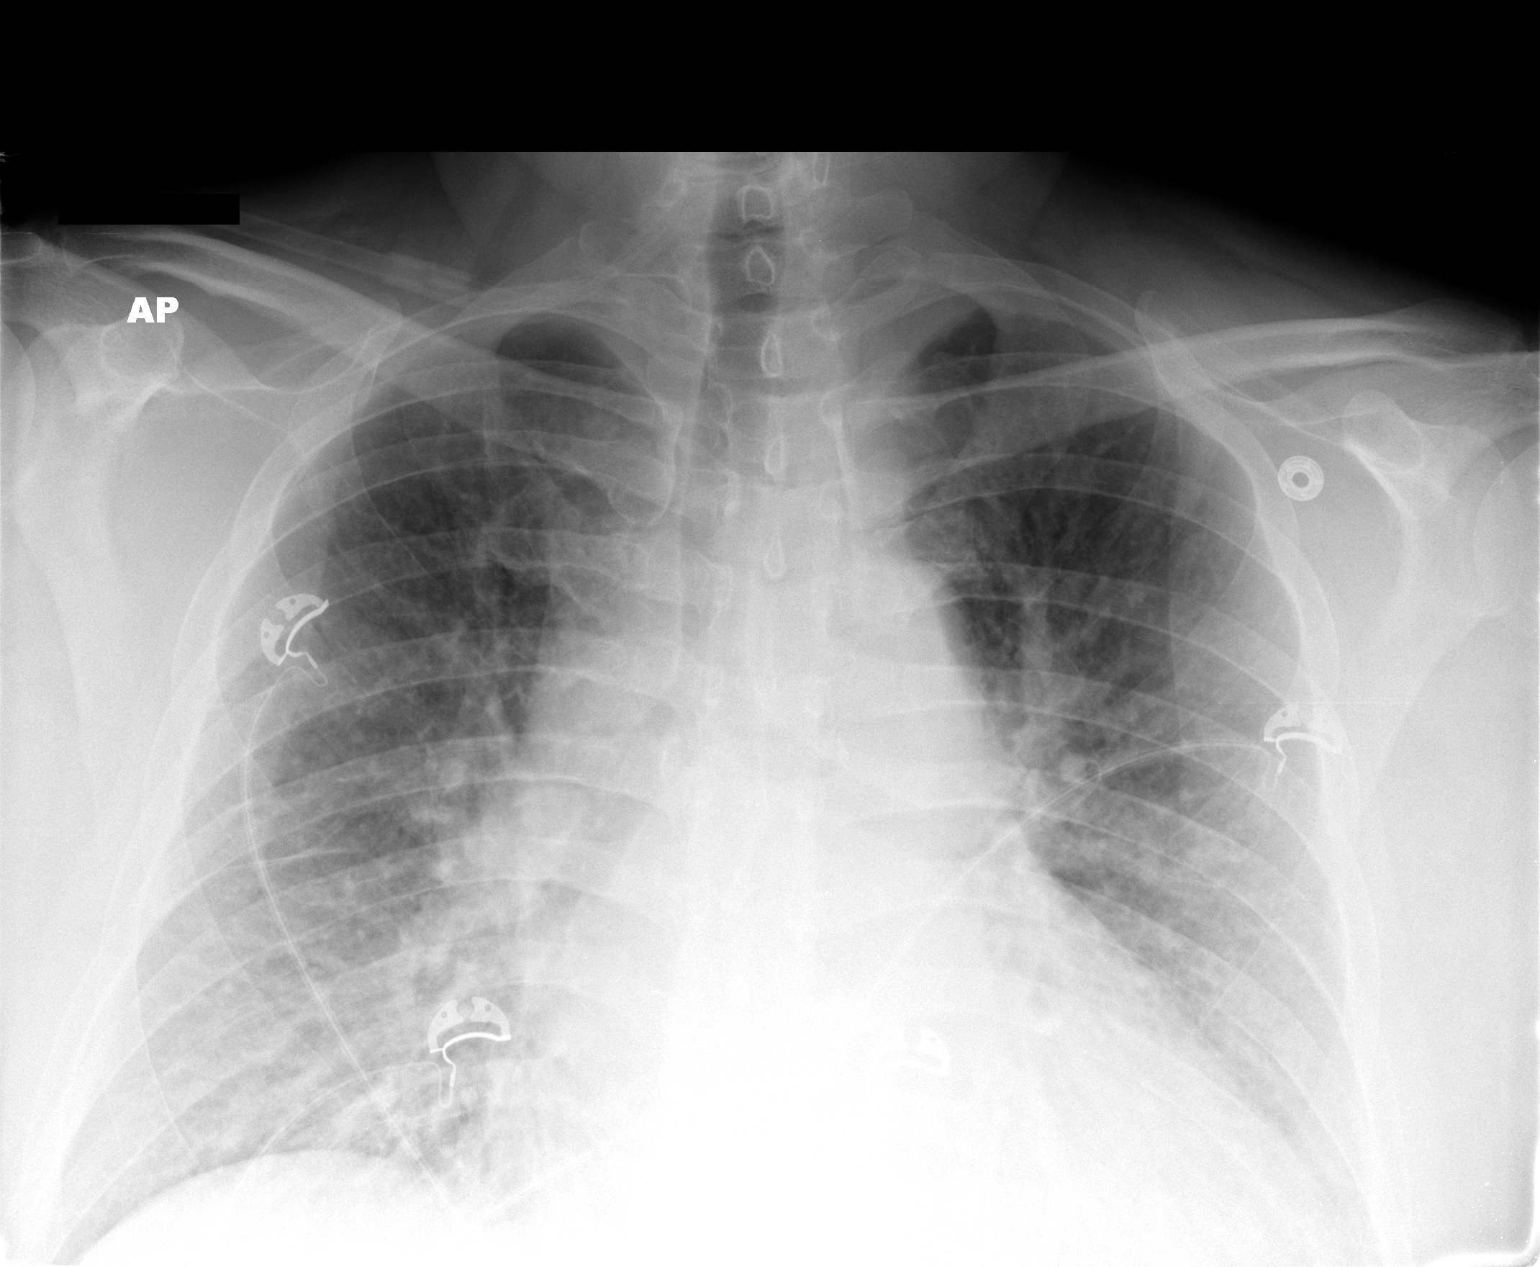

[1 of 1 positions shown; findings below may reference images not displayed]

FINDINGS: There is cardiomegaly with vascular congestion and
interstitial prominence, likely mild interstitial edema.  No
definite effusions.  No acute bony abnormality.
IMPRESSION: Mild interstitial edema/CHF.

## 2012-11-24 ENCOUNTER — Other Ambulatory Visit: Payer: Self-pay

## 2012-11-24 MED ORDER — CARVEDILOL 25 MG PO TABS: ORAL_TABLET | ORAL | Status: DC

## 2012-12-22 ENCOUNTER — Ambulatory Visit (INDEPENDENT_AMBULATORY_CARE_PROVIDER_SITE_OTHER): Payer: BC Managed Care – PPO | Admitting: Cardiovascular Disease

## 2012-12-22 ENCOUNTER — Encounter: Payer: Self-pay | Admitting: Cardiovascular Disease

## 2012-12-22 VITALS — BP 140/96 | HR 87 | Ht 77.0 in | Wt 309.0 lb

## 2012-12-22 DIAGNOSIS — I10 Essential (primary) hypertension: Secondary | ICD-10-CM

## 2012-12-22 DIAGNOSIS — I509 Heart failure, unspecified: Secondary | ICD-10-CM

## 2012-12-22 DIAGNOSIS — I4892 Unspecified atrial flutter: Secondary | ICD-10-CM

## 2012-12-22 MED ORDER — DULOXETINE HCL 20 MG PO CPEP: 20.0000 mg | ORAL_CAPSULE | Freq: Every day | ORAL | Status: DC

## 2012-12-22 NOTE — Patient Instructions (Signed)
Your physician recommends that you schedule a follow-up appointment in: NEXT  AVAILABLE WITH  DR [facility]   Your physician has recommended you make the following change in your medication:  DECREASE CARVEDILOL  TO  12.5 MG   TWICE  DAY

## 2012-12-22 NOTE — Assessment & Plan Note (Signed)
Resolved no palpitations decrease coreg due to fatigue

## 2012-12-22 NOTE — Progress Notes (Signed)
Patient ID: Harold Park, male   DOB: 1965-12-13, 47 y.o.   MRN: [id number] Harold Park is a 47 yo male with NICM with EF 30-35%, chronic systolic CHF, parox AFlutter with RVR (01/2010) and HTN. He had a cath in 01/2010 demonstrating normal cors, EF 25-30% and moderate pulmonary HTN. Echo done in 01/2010 demonstrated EF 30-35%, mild MR, mild LAE, mild RVE. He was placed on demadex and metolazone several months ago due to worsening CHF with good improvement. Long discussion regarding weight and activity. Will refer to cardiac rehab. Discussed low carb diet. Reviewed echo 06/03/10 EF improved 40-45% with mild LVE and trivial MR. Will continue current medical regimen  Weight up 288 to 304  MRI in June EF 34%  Did not like GAD made him sick to his stomach Feels HR too low for exercise effort Never gets over 120   ROS: Denies fever, malais, weight loss, blurry vision, decreased visual acuity, cough, sputum, SOB, hemoptysis, pleuritic pain, palpitaitons, heartburn, abdominal pain, melena, lower extremity edema, claudication, or rash.  All other systems reviewed and negative  General: Affect appropriate Obese male HEENT: normal Neck supple with no adenopathy JVP normal no bruits no thyromegaly Lungs clear with no wheezing and good diaphragmatic motion Heart:  S1/S2 no murmur, no rub, gallop or click PMI normal Abdomen: benighn, BS positve, no tenderness, no AAA no bruit.  No HSM or HJR Distal pulses intact with no bruits No edema Neuro non-focal Skin warm and dry No muscular weakness   Current Outpatient Prescriptions  Medication Sig Dispense Refill  . aspirin 325 MG tablet Take 325 mg by mouth daily.        . carvedilol (COREG) 25 MG tablet TAKE 1 TABLET BY MOUTH TWICE A DAY  90 tablet  0  . ibuprofen (ADVIL,MOTRIN) 200 MG tablet Take 200 mg by mouth every 6 (six) hours as needed.        Harold Park Kitchen losartan (COZAAR) 25 MG tablet Take 1 tablet (25 mg total) by mouth daily.  90 tablet  3  .  metolazone (ZAROXOLYN) 5 MG tablet Take by mouth. When needed  90 tablet  0  . NON FORMULARY Over the counter tums  As needed       . potassium chloride SA (KLOR-CON M20) 20 MEQ tablet 3 tabs twice a day per Dr Theodoro Clock  540 tablet  3  . torsemide (DEMADEX) 20 MG tablet TAKE 1 TABLET BY MOUTH TWICE A DAY  180 tablet  2   No current facility-administered medications for this visit.    Allergies  Review of patient's allergies indicates no known allergies.  Electrocardiogram:  SR rate 87 LAD old IMI ?  Assessment and Plan

## 2012-12-22 NOTE — Assessment & Plan Note (Signed)
Decrease coreg to 12.5 bid and monitor HR

## 2012-12-22 NOTE — Assessment & Plan Note (Signed)
Refill on cymbalta called in Work is stressful and leading to weight gain

## 2012-12-22 NOTE — Assessment & Plan Note (Signed)
Well controlled.  Continue current medications and low sodium Dash type diet.    

## 2013-01-18 ENCOUNTER — Ambulatory Visit: Payer: BC Managed Care – PPO | Admitting: Cardiovascular Disease

## 2013-01-24 ENCOUNTER — Other Ambulatory Visit: Payer: Self-pay | Admitting: Cardiovascular Disease

## 2013-02-16 ENCOUNTER — Other Ambulatory Visit: Payer: Self-pay | Admitting: *Deleted

## 2013-02-16 MED ORDER — CARVEDILOL 25 MG PO TABS: 12.5000 mg | ORAL_TABLET | Freq: Two times a day (BID) | ORAL | Status: DC

## 2013-02-18 ENCOUNTER — Encounter: Payer: Self-pay | Admitting: Cardiovascular Disease

## 2013-02-18 ENCOUNTER — Ambulatory Visit (INDEPENDENT_AMBULATORY_CARE_PROVIDER_SITE_OTHER): Payer: BC Managed Care – PPO | Admitting: Cardiovascular Disease

## 2013-02-18 VITALS — BP 142/80 | HR 80 | Ht 77.0 in | Wt 304.0 lb

## 2013-02-18 DIAGNOSIS — I10 Essential (primary) hypertension: Secondary | ICD-10-CM

## 2013-02-18 DIAGNOSIS — I509 Heart failure, unspecified: Secondary | ICD-10-CM

## 2013-02-18 DIAGNOSIS — I4892 Unspecified atrial flutter: Secondary | ICD-10-CM

## 2013-02-18 NOTE — Assessment & Plan Note (Signed)
Well controlled.  Continue current medications and low sodium Dash type diet.    

## 2013-02-18 NOTE — Assessment & Plan Note (Signed)
Euvolemic Functional class 2 has not had to use metazalone STable

## 2013-02-18 NOTE — Assessment & Plan Note (Signed)
Maint NSR with no palpitations  Improved  

## 2013-02-18 NOTE — Progress Notes (Signed)
Patient ID: Harold Park, male   DOB: 08-29-1965, 47 y.o.   MRN: [id number] [name] is a 47 yo male with NICM with EF 30-35%, chronic systolic CHF, parox AFlutter with RVR (01/2010) and HTN. He had a cath in 01/2010 demonstrating normal cors, EF 25-30% and moderate pulmonary HTN. Echo done in 01/2010 demonstrated EF 30-35%, mild MR, mild LAE, mild RVE. He was placed on demadex and metolazone several months ago due to worsening CHF with good improvement. Long discussion regarding weight and activity. Will refer to cardiac rehab. Discussed low carb diet. Reviewed echo 06/03/10 EF improved 40-45% with mild LVE and trivial MR. Will continue current medical regimen  Weight up 288 to 304  MRI in June EF 34% Did not like GAD made him sick to his stomach   Weight down a bit Discussed rowing as exercise option        ROS: Denies fever, malais, weight loss, blurry vision, decreased visual acuity, cough, sputum, SOB, hemoptysis, pleuritic pain, palpitaitons, heartburn, abdominal pain, melena, lower extremity edema, claudication, or rash.  All other systems reviewed and negative  General: Affect appropriate Healthy:  appears stated age HEENT: normal Neck supple with no adenopathy JVP normal no bruits no thyromegaly Lungs clear with no wheezing and good diaphragmatic motion Heart:  S1/S2 no murmur, no rub, gallop or click PMI normal Abdomen: benighn, BS positve, no tenderness, no AAA no bruit.  No HSM or HJR Distal pulses intact with no bruits Plus one bilateral edema Neuro non-focal Skin warm and dry No muscular weakness   Current Outpatient Prescriptions  Medication Sig Dispense Refill  . aspirin 325 MG tablet Take 325 mg by mouth daily.        . carvedilol (COREG) 25 MG tablet Take 0.5 tablets (12.5 mg total) by mouth 2 (two) times daily.  90 tablet  0  . DULoxetine (CYMBALTA) 20 MG capsule Take 1 capsule (20 mg total) by mouth daily.  90 capsule  3  . ibuprofen (ADVIL,MOTRIN) 200  MG tablet Take 200 mg by mouth every 6 (six) hours as needed.        Marland Kitchen losartan (COZAAR) 25 MG tablet Take 1 tablet (25 mg total) by mouth daily.  90 tablet  3  . metolazone (ZAROXOLYN) 5 MG tablet Take by mouth. When needed  90 tablet  0  . NON FORMULARY Over the counter tums  As needed       . potassium chloride SA (KLOR-CON M20) 20 MEQ tablet 3 tabs twice a day per Dr Theodoro Clock  540 tablet  3  . torsemide (DEMADEX) 20 MG tablet TAKE 1 TABLET BY MOUTH TWICE A DAY  180 tablet  2   No current facility-administered medications for this visit.    Allergies  Review of patient's allergies indicates no known allergies.  Electrocardiogram:  10/15  SR rate 87 LAD   Assessment and Plan

## 2013-02-18 NOTE — Patient Instructions (Signed)
Your physician wants you to follow-up in:  6 MONTHS WITH DR NISHAN  You will receive a reminder letter in the mail two months in advance. If you don't receive a letter, please call our office to schedule the follow-up appointment. Your physician recommends that you continue on your current medications as directed. Please refer to the Current Medication list given to you today. 

## 2013-02-22 ENCOUNTER — Other Ambulatory Visit: Payer: Self-pay | Admitting: Cardiovascular Disease

## 2013-03-08 ENCOUNTER — Other Ambulatory Visit: Payer: Self-pay

## 2013-03-08 MED ORDER — LOSARTAN POTASSIUM 25 MG PO TABS: 25.0000 mg | ORAL_TABLET | Freq: Every day | ORAL | Status: DC

## 2013-04-06 ENCOUNTER — Other Ambulatory Visit: Payer: Self-pay | Admitting: Cardiovascular Disease

## 2013-07-29 ENCOUNTER — Other Ambulatory Visit: Payer: Self-pay

## 2013-07-29 MED ORDER — CARVEDILOL 25 MG PO TABS: 12.5000 mg | ORAL_TABLET | Freq: Two times a day (BID) | ORAL | Status: DC

## 2013-09-20 ENCOUNTER — Other Ambulatory Visit: Payer: Self-pay

## 2013-09-20 MED ORDER — LOSARTAN POTASSIUM 25 MG PO TABS: 25.0000 mg | ORAL_TABLET | Freq: Every day | ORAL | Status: DC

## 2013-10-26 ENCOUNTER — Ambulatory Visit (INDEPENDENT_AMBULATORY_CARE_PROVIDER_SITE_OTHER): Payer: BC Managed Care – PPO | Admitting: Cardiovascular Disease

## 2013-10-26 ENCOUNTER — Encounter: Payer: Self-pay | Admitting: Cardiovascular Disease

## 2013-10-26 VITALS — BP 128/90 | HR 82 | Ht 77.0 in | Wt 298.0 lb

## 2013-10-26 DIAGNOSIS — I509 Heart failure, unspecified: Secondary | ICD-10-CM

## 2013-10-26 DIAGNOSIS — I10 Essential (primary) hypertension: Secondary | ICD-10-CM

## 2013-10-26 DIAGNOSIS — Z79899 Other long term (current) drug therapy: Secondary | ICD-10-CM

## 2013-10-26 LAB — BASIC METABOLIC PANEL
BUN: 17 mg/dL (ref 6–23)
CHLORIDE: 109 meq/L (ref 96–112)
CO2: 26 mEq/L (ref 19–32)
CREATININE: 1 mg/dL (ref 0.4–1.5)
Calcium: 9.2 mg/dL (ref 8.4–10.5)
GFR: 87.28 mL/min (ref >60.00)
Glucose, Bld: 86 mg/dL (ref 70–99)
Potassium: 3.8 mEq/L (ref 3.5–5.1)
Sodium: 143 mEq/L (ref 135–145)

## 2013-10-26 NOTE — Assessment & Plan Note (Signed)
Well controlled.  Continue current medications and low sodium Dash type diet.    

## 2013-10-26 NOTE — Assessment & Plan Note (Signed)
Euvolemic  F/U BMET today  Continue current meds and low sodium diet

## 2013-10-26 NOTE — Progress Notes (Signed)
Patient ID: Harold Park, male   DOB: [birth year], 48 y.o.   MRN: [id number] Eladio Dentremont is a 48 yo male with NICM with EF 44-31%, chronic systolic CHF, parox AFlutter with RVR (01/2010) and HTN. He had a cath in 01/2010 demonstrating normal cors, EF 25-30% and moderate pulmonary HTN. Echo done in 01/2010 demonstrated EF 30-35%, mild MR, mild LAE, mild RVE. He was placed on demadex and metolazone several months ago due to worsening CHF with good improvement. Long discussion regarding weight and activity. Will refer to cardiac rehab. Discussed low carb diet. Reviewed echo 06/03/10 EF improved 40-45% with mild LVE and trivial MR. 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  No PND / Orthopnea    ROS: Denies fever, malais, weight loss, blurry vision, decreased visual acuity, cough, sputum, SOB, hemoptysis, pleuritic pain, palpitaitons, heartburn, abdominal pain, melena, lower extremity edema, claudication, or rash.  All other systems reviewed and negative  General: Affect appropriate Healthy:  appears stated age 52: normal Neck supple with no adenopathy JVP normal no bruits no thyromegaly Lungs clear with no wheezing and good diaphragmatic motion Heart:  S1/S2 no murmur, no rub, gallop or click PMI normal Abdomen: benighn, BS positve, no tenderness, no AAA no bruit.  No HSM or HJR Distal pulses intact with no bruits No edema Neuro non-focal Skin warm and dry No muscular weakness   Current Outpatient Prescriptions  Medication Sig Dispense Refill  . aspirin 325 MG tablet Take 325 mg by mouth daily.        . carvedilol (COREG) 25 MG tablet Take 0.5 tablets (12.5 mg total) by mouth 2 (two) times daily.  90 tablet  2  . DULoxetine (CYMBALTA) 20 MG capsule Take 1 capsule (20  mg total) by mouth daily.  90 capsule  3  . ibuprofen (ADVIL,MOTRIN) 200 MG tablet Take 200 mg by mouth every 6 (six) hours as needed.        Marland Kitchen losartan (COZAAR) 25 MG tablet Take 1 tablet (25 mg total) by mouth daily.  90 tablet  0  . metolazone (ZAROXOLYN) 5 MG tablet Take by mouth. When needed  90 tablet  0  . NON FORMULARY Over the counter tums  As needed       . potassium chloride SA (K-DUR,KLOR-CON) 20 MEQ tablet TAKE 3 TABLETS BY MOUTH TWICE A DAY  540 tablet  1  . torsemide (DEMADEX) 20 MG tablet TAKE 1 TABLET BY MOUTH TWICE A DAY  180 tablet  2   No current facility-administered medications for this visit.    Allergies  Review of patient's allergies indicates no known allergies.  Electrocardiogram:  SR LAD possible old IMI   Assessment and Plan

## 2013-10-26 NOTE — Assessment & Plan Note (Signed)
Resolved maintaining NSR  No palpitations

## 2013-10-26 NOTE — Patient Instructions (Signed)
Your physician recommends that you return for lab work today for Bmet.   Your physician recommends that you continue on your current medications as directed. Please refer to the Current Medication list given to you today.  Your physician wants you to follow-up in: 6 months with Dr. [name]. You will receive a reminder letter in the mail two months in advance. If you don't receive a letter, please call our office to schedule the follow-up appointment.

## 2013-12-28 ENCOUNTER — Ambulatory Visit (INDEPENDENT_AMBULATORY_CARE_PROVIDER_SITE_OTHER): Payer: BC Managed Care – PPO

## 2013-12-28 DIAGNOSIS — Z23 Encounter for immunization: Secondary | ICD-10-CM

## 2014-01-26 ENCOUNTER — Telehealth: Payer: Self-pay | Admitting: Cardiovascular Disease

## 2014-01-26 NOTE — Telephone Encounter (Signed)
Pt states that for the last two days his BP has been running high. Last night he took his BP, because he had a headache his BP was 167/92 pt  took an extra losartan and it helped. Today pt took his BP medication about 8:30 AM his BP is now 152/90. Pt denies SOB or chest pain. Pt takes Losartan 25 mg daily and Carvedilol 12.5 mg twice a day. Dr Lovena Le DOD recommends for pt to increased Losartan 25 mg to twice a day, pt to F/U with Dr. [name]. Pt has an appointment wit Dr. [name] on 12 /16/15 at 4:00 PM pt is aware.

## 2014-01-26 NOTE — Telephone Encounter (Signed)
New Msg   Patient calling stating BP has been high for the last few days. Last night it was 167/92 and today 153/99 and would like to be contacted about this. No chest pains or shortness of breath. Patient had a headache and took an extra BP medication. Please call at [phone].

## 2014-02-12 ENCOUNTER — Other Ambulatory Visit: Payer: Self-pay | Admitting: Cardiovascular Disease

## 2014-02-21 NOTE — Progress Notes (Signed)
Patient ID: Harold Park, male   DOB: 11-26-1965, 48 y.o.   MRN: [id number] Harold Park is a 48 yo male with NICM with EF 23-53%, chronic systolic CHF, parox AFlutter with RVR (01/2010) and HTN. He had a cath in 01/2010 demonstrating normal cors, EF 25-30% and moderate pulmonary HTN. Echo done in 01/2010 demonstrated EF 30-35%, mild MR, mild LAE, mild RVE. He was placed on demadex and metolazone several months ago due to worsening CHF with good improvement. Long discussion regarding weight and activity. Will refer to cardiac rehab. Discussed low carb diet. Reviewed echo 06/03/10 EF improved 40-45% with mild LVE and trivial MR. 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 No PND / Orthopnea  8/15  K 3.8  Cr 1.0  ROS: Denies fever, malais, weight loss, blurry vision, decreased visual acuity, cough, sputum, SOB, hemoptysis, pleuritic pain, palpitaitons, heartburn, abdominal pain, melena, lower extremity edema, claudication, or rash.  All other systems reviewed and negative  General: Affect appropriate Obese white male  HEENT: normal Neck supple with no adenopathy JVP normal no bruits no thyromegaly Lungs clear with no wheezing and good diaphragmatic motion Heart:  S1/S2 no murmur, no rub, gallop or click PMI normal Abdomen: benighn, BS positve, no tenderness, no AAA no bruit.  No HSM or HJR Distal pulses intact with no bruits No edema Neuro non-focal Skin warm and dry No muscular weakness   Current Outpatient Prescriptions  Medication Sig Dispense Refill  . aspirin 325 MG tablet Take 325 mg by mouth daily.      . carvedilol (COREG) 25 MG tablet Take 0.5 tablets (12.5 mg total) by mouth 2 (two) times daily. 90 tablet 2  . DULoxetine (CYMBALTA) 20 MG capsule Take 1 capsule (20 mg  total) by mouth daily. 90 capsule 3  . ibuprofen (ADVIL,MOTRIN) 200 MG tablet Take 200 mg by mouth every 6 (six) hours as needed.      Marland Kitchen KLOR-CON M20 20 MEQ tablet TAKE 3 TABLETS BY MOUTH TWICE A DAY 540 tablet 0  . losartan (COZAAR) 25 MG tablet Take 1 tablet (25 mg total) by mouth daily. 90 tablet 0  . metolazone (ZAROXOLYN) 5 MG tablet Take by mouth. When needed 90 tablet 0  . NON FORMULARY Over the counter tums  As needed     . torsemide (DEMADEX) 20 MG tablet TAKE 1 TABLET BY MOUTH TWICE A DAY 180 tablet 2   No current facility-administered medications for this visit.    Allergies  Review of patient's allergies indicates no known allergies.  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

## 2014-02-22 ENCOUNTER — Ambulatory Visit (INDEPENDENT_AMBULATORY_CARE_PROVIDER_SITE_OTHER): Payer: BC Managed Care – PPO | Admitting: Cardiovascular Disease

## 2014-02-22 ENCOUNTER — Encounter: Payer: Self-pay | Admitting: Cardiovascular Disease

## 2014-02-22 VITALS — BP 130/76 | HR 78 | Ht 77.0 in | Wt 302.0 lb

## 2014-02-22 DIAGNOSIS — I4892 Unspecified atrial flutter: Secondary | ICD-10-CM

## 2014-02-22 DIAGNOSIS — I10 Essential (primary) hypertension: Secondary | ICD-10-CM

## 2014-02-22 DIAGNOSIS — I5022 Chronic systolic (congestive) heart failure: Secondary | ICD-10-CM

## 2014-02-22 DIAGNOSIS — R000 Tachycardia, unspecified: Secondary | ICD-10-CM

## 2014-02-22 MED ORDER — LOSARTAN POTASSIUM 25 MG PO TABS: 25.0000 mg | ORAL_TABLET | Freq: Every day | ORAL | Status: DC

## 2014-02-22 NOTE — Assessment & Plan Note (Signed)
Euvolemic continue current meds  Needs to eat less fast food and loose weight.  Consider stopping zaroxyln and K and starting aldactone in future

## 2014-02-22 NOTE — Patient Instructions (Signed)
Your physician wants you to follow-up in:  6 MONTHS WITH DR NISHAN  You will receive a reminder letter in the mail two months in advance. If you don't receive a letter, please call our office to schedule the follow-up appointment. Your physician recommends that you continue on your current medications as directed. Please refer to the Current Medication list given to you today. 

## 2014-02-22 NOTE — Assessment & Plan Note (Signed)
Well controlled.  Continue current medications and low sodium Dash type diet.    

## 2014-02-22 NOTE — Assessment & Plan Note (Signed)
Stressed compliance with beta blocker  Labs with primary Deconditioning and obesity contribute

## 2014-03-23 ENCOUNTER — Other Ambulatory Visit: Payer: Self-pay | Admitting: Cardiovascular Disease

## 2014-06-08 ENCOUNTER — Other Ambulatory Visit: Payer: Self-pay | Admitting: Cardiovascular Disease

## 2014-08-25 ENCOUNTER — Other Ambulatory Visit: Payer: Self-pay

## 2014-08-25 MED ORDER — CARVEDILOL 25 MG PO TABS: 12.5000 mg | ORAL_TABLET | Freq: Two times a day (BID) | ORAL | Status: DC

## 2014-08-25 NOTE — Progress Notes (Signed)
Patient ID: Harold Park, male   DOB: 1966-01-29, 49 y.o.   MRN: [id number] Harold Park is a 49 y.o. male with NICM with EF 22-97%, chronic systolic CHF, parox AFlutter with RVR (01/2010) and HTN. He had a cath in 01/2010 demonstrating normal cors, EF 25-30% and moderate pulmonary HTN. Echo done in 01/2010 demonstrated EF 30-35%, mild MR, mild LAE, mild RVE. He was placed on demadex and metolazone several months ago due to worsening CHF with good improvement. Long discussion regarding weight and activity. Will refer to cardiac rehab. Discussed low carb diet. Reviewed echo 06/03/10 EF improved 40-45% with mild LVE and trivial MR. 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 No PND / Orthopnea  Lab Results  Component Value Date   CREATININE 1.0 10/26/2013   BUN 17 10/26/2013   NA 143 10/26/2013   K 3.8 10/26/2013   CL 109 10/26/2013   CO2 26 10/26/2013    Daughter got full ride to [place].  Reviewed diet suppl and has Garninia in it but other ingredients ok   ROS: Denies fever, malais, weight loss, blurry vision, decreased visual acuity, cough, sputum, SOB, hemoptysis, pleuritic pain, palpitaitons, heartburn, abdominal pain, melena, lower extremity edema, claudication, or rash.  All other systems reviewed and negative  Filed Vitals:   08/28/14 0938  BP: 118/70  Pulse: 64    General: Affect appropriate Obese white male  HEENT: normal Neck supple with no adenopathy JVP normal no bruits no thyromegaly Lungs clear with no wheezing and good diaphragmatic motion Heart:  S1/S2 no murmur, no rub, gallop or click PMI normal Abdomen: benighn, BS positve, no tenderness, no AAA no bruit.  No HSM or HJR Distal pulses intact with no bruits No edema Neuro non-focal Skin warm and  dry No muscular weakness   Current Outpatient Prescriptions  Medication Sig Dispense Refill  . aspirin 81 MG tablet Take 81 mg by mouth daily.    . carvedilol (COREG) 25 MG tablet Take 0.5 tablets (12.5 mg total) by mouth 2 (two) times daily. 90 tablet 0  . DULoxetine (CYMBALTA) 20 MG capsule TAKE 1 CAPSULE (20 MG TOTAL) BY MOUTH DAILY. 90 capsule 3  . ibuprofen (ADVIL,MOTRIN) 200 MG tablet Take 200 mg by mouth every 6 (six) hours as needed.      Marland Kitchen KLOR-CON M20 20 MEQ tablet TAKE 3 TABLETS BY MOUTH TWICE A DAY 540 tablet 0  . losartan (COZAAR) 25 MG tablet TAKE 1 TABLET (25 MG TOTAL) BY MOUTH DAILY. 90 tablet 0  . methocarbamol (ROBAXIN) 500 MG tablet Take 500 mg by mouth 2 (two) times daily.    . metolazone (ZAROXOLYN) 5 MG tablet Take by mouth. When needed 90 tablet 0  . torsemide (DEMADEX) 20 MG tablet Take 1 tablet (20 mg total) by mouth 2 (two) times daily. 180 tablet 2   No current facility-administered medications for this visit.    Allergies  Review of patient's allergies indicates no known allergies.  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  Continue current medications and low sodium Dash type diet.   Obesity:  Discussed low carb diet ok to use supplement  Jenkins Rouge

## 2014-08-27 ENCOUNTER — Other Ambulatory Visit: Payer: Self-pay | Admitting: Cardiovascular Disease

## 2014-08-28 ENCOUNTER — Other Ambulatory Visit: Payer: Self-pay

## 2014-08-28 ENCOUNTER — Telehealth: Payer: Self-pay

## 2014-08-28 ENCOUNTER — Ambulatory Visit (INDEPENDENT_AMBULATORY_CARE_PROVIDER_SITE_OTHER): Payer: Self-pay | Admitting: Cardiovascular Disease

## 2014-08-28 ENCOUNTER — Encounter: Payer: Self-pay | Admitting: Cardiovascular Disease

## 2014-08-28 VITALS — BP 118/70 | HR 64 | Ht 77.0 in | Wt 299.8 lb

## 2014-08-28 DIAGNOSIS — E876 Hypokalemia: Secondary | ICD-10-CM

## 2014-08-28 DIAGNOSIS — R609 Edema, unspecified: Secondary | ICD-10-CM

## 2014-08-28 DIAGNOSIS — Z79899 Other long term (current) drug therapy: Secondary | ICD-10-CM

## 2014-08-28 LAB — BASIC METABOLIC PANEL
BUN: 23 mg/dL (ref 6–23)
CALCIUM: 10.2 mg/dL (ref 8.4–10.5)
CO2: 29 meq/L (ref 19–32)
Chloride: 97 mEq/L (ref 96–112)
Creatinine, Ser: 1.25 mg/dL (ref 0.40–1.50)
GFR: 64.91 mL/min (ref >60.00)
Glucose, Bld: 119 mg/dL — ABNORMAL HIGH (ref 70–99)
Potassium: 2.8 mEq/L — CL (ref 3.5–5.1)
Sodium: 136 mEq/L (ref 135–145)

## 2014-08-28 LAB — BRAIN NATRIURETIC PEPTIDE: PRO B NATRI PEPTIDE: 64 pg/mL (ref 0.0–100.0)

## 2014-08-28 MED ORDER — TORSEMIDE 20 MG PO TABS: 20.0000 mg | ORAL_TABLET | Freq: Two times a day (BID) | ORAL | Status: DC

## 2014-08-28 NOTE — Patient Instructions (Addendum)
Medication Instructions:  NO CHANGES   Labwork: TODAY  BMET  BNP  Testing/Procedures: Your physician has requested that you have an echocardiogram. Echocardiography is a painless test that uses sound waves to create images of your heart. It provides your doctor with information about the size and shape of your heart and how well your heart's chambers and valves are working. This procedure takes approximately one hour. There are no restrictions for this procedure. IN  3 MONTHS   Follow-Up: Your physician recommends that you schedule a follow-up appointment in: 3 MONTHS  WITH  DR [name]  ECHO SAME  DAY   Any Other Special Instructions Will Be Listed Below (If Applicable).

## 2014-08-28 NOTE — Telephone Encounter (Addendum)
Harold Park from lab called with critical potassium of 2.8. Per Dr. [name], patient is to take K+ 4 tablets now. Patient is to START ALDACTONE 25 mg daily tomorrow. Patient is to DECREASE TORSEMIDE to one tablet (20 mg total) daily. Patient to have BMET in two weeks.  Left message to call back.

## 2014-08-29 MED ORDER — SPIRONOLACTONE 25 MG PO TABS: 25.0000 mg | ORAL_TABLET | Freq: Every day | ORAL | Status: DC

## 2014-08-29 NOTE — Telephone Encounter (Signed)
LMTCB ASAP.

## 2014-08-29 NOTE — Telephone Encounter (Signed)
Pt advised, verbalized understanding, agreed with plan.  

## 2014-09-02 ENCOUNTER — Other Ambulatory Visit: Payer: Self-pay | Admitting: Cardiovascular Disease

## 2014-09-12 ENCOUNTER — Other Ambulatory Visit (INDEPENDENT_AMBULATORY_CARE_PROVIDER_SITE_OTHER): Payer: BLUE CROSS/BLUE SHIELD | Admitting: *Deleted

## 2014-09-12 DIAGNOSIS — E876 Hypokalemia: Secondary | ICD-10-CM

## 2014-09-12 LAB — BASIC METABOLIC PANEL
BUN: 19 mg/dL (ref 6–23)
CHLORIDE: 107 meq/L (ref 96–112)
CO2: 26 mEq/L (ref 19–32)
Calcium: 9.5 mg/dL (ref 8.4–10.5)
Creatinine, Ser: 0.99 mg/dL (ref 0.40–1.50)
GFR: 84.94 mL/min (ref >60.00)
Glucose, Bld: 68 mg/dL — ABNORMAL LOW (ref 70–99)
POTASSIUM: 3.8 meq/L (ref 3.5–5.1)
Sodium: 142 mEq/L (ref 135–145)

## 2014-09-13 ENCOUNTER — Other Ambulatory Visit: Payer: Self-pay

## 2014-10-08 ENCOUNTER — Other Ambulatory Visit: Payer: Self-pay | Admitting: Cardiovascular Disease

## 2014-10-16 ENCOUNTER — Other Ambulatory Visit: Payer: Self-pay | Admitting: *Deleted

## 2014-10-16 DIAGNOSIS — E876 Hypokalemia: Secondary | ICD-10-CM

## 2014-10-16 MED ORDER — SPIRONOLACTONE 25 MG PO TABS: 25.0000 mg | ORAL_TABLET | Freq: Every day | ORAL | Status: DC

## 2014-11-28 ENCOUNTER — Other Ambulatory Visit: Payer: Self-pay | Admitting: Cardiovascular Disease

## 2014-11-29 ENCOUNTER — Encounter: Payer: Self-pay | Admitting: *Deleted

## 2014-12-03 NOTE — Progress Notes (Signed)
Patient ID: Harold Park, male   DOB: 1965-03-14, 49 y.o.   MRN: [id number] Harold Park is a 49 y.o. male with NICM with EF 49-82%, chronic systolic CHF, parox AFlutter with RVR (01/2010) and HTN. He had a cath in 01/2010 demonstrating normal cors, EF 25-30% and moderate pulmonary HTN. Echo done in 01/2010 demonstrated EF 30-35%, mild MR, mild LAE, mild RVE. He was placed on demadex and metolazone several months ago due to worsening CHF with good improvement. Long discussion regarding weight and activity. Will refer to cardiac rehab. Discussed low carb diet. Reviewed echo 06/03/10 EF improved 40-45% with mild LVE and trivial MR.   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 No PND / Orthopnea Daughter got full ride to [place].  Reviewed diet suppl and has Garninia in it but other ingredients ok   Lab Results  Component Value Date   CREATININE 0.99 09/12/2014   BUN 19 09/12/2014   NA 142 09/12/2014   K 3.8 09/12/2014   CL 107 09/12/2014   CO2 26 09/12/2014      ROS: Denies fever, malais, weight loss, blurry vision, decreased visual acuity, cough, sputum, SOB, hemoptysis, pleuritic pain, palpitaitons, heartburn, abdominal pain, melena, lower extremity edema, claudication, or rash.  All other systems reviewed and negative  Filed Vitals:   12/04/14 0819  BP: 144/82  Pulse: 76    General: Affect appropriate Obese white male  HEENT: normal Neck supple with no adenopathy JVP normal no bruits no thyromegaly Lungs clear with no wheezing and good diaphragmatic motion Heart:  S1/S2 no murmur,  no rub, gallop or click PMI normal Abdomen: benighn, BS positve, no tenderness, no AAA no bruit.  No HSM or HJR Distal pulses intact with no bruits No edema Neuro non-focal Skin warm and dry No muscular weakness   Current Outpatient Prescriptions  Medication Sig Dispense Refill  . aspirin 81 MG tablet Take 81 mg by mouth daily.    . carvedilol (COREG) 25 MG tablet Take 0.5 tablets (12.5 mg total) by mouth 2 (two) times daily. 90 tablet 0  . DULoxetine (CYMBALTA) 20 MG capsule TAKE 1 CAPSULE (20 MG TOTAL) BY MOUTH DAILY. 90 capsule 3  . ibuprofen (ADVIL,MOTRIN) 200 MG tablet Take 200 mg by mouth every 6 (six) hours as needed for mild pain or moderate pain.     Marland Kitchen KLOR-CON M20 20 MEQ tablet TAKE 3 TABLETS BY MOUTH TWICE A DAY 540 tablet 6  . losartan (COZAAR) 25 MG tablet TAKE 1 TABLET (25 MG TOTAL) BY MOUTH DAILY. 90 tablet 0  . methocarbamol (ROBAXIN) 500 MG tablet Take 500 mg by mouth 2 (two) times daily.    . metolazone (ZAROXOLYN) 5 MG tablet Take 5 mg by mouth daily as needed (fluid?).     Marland Kitchen spironolactone (ALDACTONE) 25 MG tablet Take 1 tablet (25 mg total) by mouth daily. 90 tablet 0  . torsemide (DEMADEX) 20 MG tablet Take 1 tablet (20 mg total) by mouth daily.     No current  facility-administered medications for this visit.    Allergies  Review of patient's allergies indicates no known allergies.  Electrocardiogram:  12/22/12  SR rate 87 LAD pulmonary disease pattern possible old IMI  02/2014  SR rate 106  LAE LAD ICRBBB   Assessment and Plan CHF:  Diuretecs adjusted K better   Continue ARB and coreg  Echo today reviewed EF 30-35% moderate LVE HTN:  Well controlled.  Continue current medications and low sodium Dash type diet.   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

## 2014-12-04 ENCOUNTER — Ambulatory Visit (HOSPITAL_COMMUNITY): Payer: BLUE CROSS/BLUE SHIELD | Attending: Cardiovascular Disease

## 2014-12-04 ENCOUNTER — Other Ambulatory Visit: Payer: Self-pay

## 2014-12-04 ENCOUNTER — Ambulatory Visit (INDEPENDENT_AMBULATORY_CARE_PROVIDER_SITE_OTHER): Payer: BLUE CROSS/BLUE SHIELD | Admitting: Cardiovascular Disease

## 2014-12-04 ENCOUNTER — Encounter: Payer: Self-pay | Admitting: Cardiovascular Disease

## 2014-12-04 VITALS — BP 144/82 | HR 76 | Ht 77.0 in | Wt 306.4 lb

## 2014-12-04 DIAGNOSIS — Z79899 Other long term (current) drug therapy: Secondary | ICD-10-CM

## 2014-12-04 DIAGNOSIS — I4892 Unspecified atrial flutter: Secondary | ICD-10-CM

## 2014-12-04 DIAGNOSIS — I517 Cardiomegaly: Secondary | ICD-10-CM | POA: Insufficient documentation

## 2014-12-04 DIAGNOSIS — I440 Atrioventricular block, first degree: Secondary | ICD-10-CM | POA: Diagnosis not present

## 2014-12-04 DIAGNOSIS — R609 Edema, unspecified: Secondary | ICD-10-CM | POA: Diagnosis not present

## 2014-12-04 DIAGNOSIS — R0600 Dyspnea, unspecified: Secondary | ICD-10-CM | POA: Insufficient documentation

## 2014-12-04 DIAGNOSIS — I351 Nonrheumatic aortic (valve) insufficiency: Secondary | ICD-10-CM | POA: Insufficient documentation

## 2014-12-04 DIAGNOSIS — I10 Essential (primary) hypertension: Secondary | ICD-10-CM | POA: Diagnosis not present

## 2014-12-04 LAB — BASIC METABOLIC PANEL
BUN: 20 mg/dL (ref 6–23)
CALCIUM: 9.6 mg/dL (ref 8.4–10.5)
CO2: 27 mEq/L (ref 19–32)
CREATININE: 0.88 mg/dL (ref 0.40–1.50)
Chloride: 109 mEq/L (ref 96–112)
GFR: 97.22 mL/min (ref >60.00)
GLUCOSE: 97 mg/dL (ref 70–99)
Potassium: 4.1 mEq/L (ref 3.5–5.1)
SODIUM: 143 meq/L (ref 135–145)

## 2014-12-04 NOTE — Patient Instructions (Signed)
Medication Instructions:  Your physician recommends that you continue on your current medications as directed. Please refer to the Current Medication list given to you today.   Labwork: TODAY  BMET  Testing/Procedures: NONE  Follow-Up: Your physician wants you to follow-up in:   [place] will receive a reminder letter in the mail two months in advance. If you don't receive a letter, please call our office to schedule the follow-up appointment.   Any Other Special Instructions Will Be Listed Below (If Applicable).

## 2014-12-13 ENCOUNTER — Other Ambulatory Visit: Payer: Self-pay | Admitting: Cardiovascular Disease

## 2014-12-13 MED ORDER — POTASSIUM CHLORIDE CRYS ER 20 MEQ PO TBCR: 60.0000 meq | EXTENDED_RELEASE_TABLET | Freq: Two times a day (BID) | ORAL | Status: DC

## 2014-12-19 ENCOUNTER — Other Ambulatory Visit: Payer: Self-pay | Admitting: Cardiovascular Disease

## 2015-02-23 ENCOUNTER — Other Ambulatory Visit: Payer: Self-pay | Admitting: Cardiovascular Disease

## 2015-03-25 ENCOUNTER — Other Ambulatory Visit: Payer: Self-pay | Admitting: Cardiovascular Disease

## 2015-04-04 ENCOUNTER — Telehealth: Payer: Self-pay | Admitting: Cardiovascular Disease

## 2015-04-04 DIAGNOSIS — Z79899 Other long term (current) drug therapy: Secondary | ICD-10-CM

## 2015-04-04 DIAGNOSIS — I77810 Thoracic aortic ectasia: Secondary | ICD-10-CM

## 2015-04-04 NOTE — Telephone Encounter (Signed)
Left message for patient to call back  

## 2015-04-04 NOTE — Telephone Encounter (Signed)
Follow Up  Pt called to sche from recall. Was request a call back to determine if any test are needed at this time. Please call

## 2015-04-04 NOTE — Telephone Encounter (Signed)
Can do MRI with no contrast If he wants valium before procedure can call him in 4 5mg  tablets and have him take one 2 hrs before test.  He will need someone to drive him.  If he does not want MRI can order CTA of aorta Will need BMET either way

## 2015-04-04 NOTE — Telephone Encounter (Signed)
From office visit with Dr. [name] on 12/04/14  "Aortic Root: Today on echo 4.2 cm Will get MRI/MRA of heart and aorta in 6 months" Patient should be due to have test in March, and has follow-up OV in April. Patient stated he gets anxious with having MRIs, and he had some flushing with IV dye/Contrast last time. Will forward to Dr. [name] for order clarification and any additional recommendation.

## 2015-04-27 NOTE — Telephone Encounter (Signed)
Talked to patient and he will give the office a call on Monday to let us know what he wants to do.

## 2015-05-05 ENCOUNTER — Other Ambulatory Visit: Payer: Self-pay | Admitting: Cardiovascular Disease

## 2015-05-11 DIAGNOSIS — I77810 Thoracic aortic ectasia: Secondary | ICD-10-CM | POA: Insufficient documentation

## 2015-05-11 MED ORDER — DIAZEPAM 5 MG PO TABS: ORAL_TABLET | ORAL | Status: DC

## 2015-05-11 NOTE — Telephone Encounter (Signed)
Called patient. Patient has agreed to have the MRI. Patient states he will need the valium for anxienty, will send to patient's pharmacy. Once MRI is scheduled, will make an appointment for BMET. Patient verbalized understanding and agreed to plan.

## 2015-05-15 ENCOUNTER — Encounter: Payer: Self-pay | Admitting: Cardiovascular Disease

## 2015-05-15 NOTE — Telephone Encounter (Signed)
Patient has lab appointment for BMET on 3/21 before his MRI on 3/24

## 2015-05-18 ENCOUNTER — Telehealth: Payer: Self-pay | Admitting: Cardiovascular Disease

## 2015-05-18 DIAGNOSIS — Z01812 Encounter for preprocedural laboratory examination: Secondary | ICD-10-CM

## 2015-05-18 DIAGNOSIS — I77810 Thoracic aortic ectasia: Secondary | ICD-10-CM

## 2015-05-18 NOTE — Telephone Encounter (Signed)
Pt says he needs to cx MRI on 06-01-15. Please call to reschedule another date.

## 2015-05-18 NOTE — Telephone Encounter (Signed)
Sent staff message to Shirl Harris at [facility] to reschedule and also left her a VM. Will also route to Joanne Gavel, Dr. [name] nurse.

## 2015-05-21 NOTE — Telephone Encounter (Signed)
Left message for patient to call back. Will send message to [facility] to reschedule MRI and lab work.

## 2015-05-23 NOTE — Telephone Encounter (Signed)
Mr.Scouten is calling because his insurance will not pay enough to have the MRI done at [facility] and is needing it to be schedule elsewhere. Thanks

## 2015-05-23 NOTE — Telephone Encounter (Signed)
Patient wants to have MRI done at [facility]. Will send message to per cert for MRI to be done at [facility]. Will call patient back when he is pre approved for [facility] and will find out what patient needs to do to get scheduled in their system.

## 2015-05-24 ENCOUNTER — Encounter: Payer: Self-pay | Admitting: Cardiovascular Disease

## 2015-05-29 ENCOUNTER — Other Ambulatory Visit: Payer: BLUE CROSS/BLUE SHIELD

## 2015-05-30 ENCOUNTER — Other Ambulatory Visit (HOSPITAL_COMMUNITY): Payer: BLUE CROSS/BLUE SHIELD

## 2015-06-01 ENCOUNTER — Other Ambulatory Visit (HOSPITAL_COMMUNITY): Payer: BLUE CROSS/BLUE SHIELD

## 2015-06-01 ENCOUNTER — Telehealth: Payer: Self-pay | Admitting: Cardiovascular Disease

## 2015-06-01 NOTE — Telephone Encounter (Signed)
Patient called back. Patient will come by on Monday 06/04/15 for lab work BMET and INR/PT for MRI. Need lab results to complete form from [facility].

## 2015-06-01 NOTE — Telephone Encounter (Signed)
Patient needs to come in for lab work prior to sending form back to [facility]. Dr. [name] will also have to sign form.

## 2015-06-01 NOTE — Telephone Encounter (Signed)
Left message for patient to call back. [facility] hospital and they will fax an order form.  Received form. Need to talk to patient to answer questions and have lab work done.

## 2015-06-01 NOTE — Telephone Encounter (Signed)
New Message:  Pt called back to to see if the MRI can be scheduled at [facility] . Please f/u with him

## 2015-06-04 ENCOUNTER — Other Ambulatory Visit: Payer: BLUE CROSS/BLUE SHIELD | Admitting: *Deleted

## 2015-06-04 DIAGNOSIS — Z01812 Encounter for preprocedural laboratory examination: Secondary | ICD-10-CM

## 2015-06-04 LAB — PROTIME-INR
INR: 0.97 (ref <1.50)
PROTHROMBIN TIME: 13 s (ref 11.6–15.2)

## 2015-06-04 LAB — BASIC METABOLIC PANEL
BUN: 18 mg/dL (ref 7–25)
CO2: 25 mmol/L (ref 20–31)
Calcium: 9.2 mg/dL (ref 8.6–10.3)
Chloride: 107 mmol/L (ref 98–110)
Creat: 1.02 mg/dL (ref 0.70–1.33)
Glucose, Bld: 87 mg/dL (ref 65–99)
POTASSIUM: 4.1 mmol/L (ref 3.5–5.3)
SODIUM: 143 mmol/L (ref 135–146)

## 2015-06-05 ENCOUNTER — Ambulatory Visit (HOSPITAL_COMMUNITY): Payer: BLUE CROSS/BLUE SHIELD

## 2015-06-05 NOTE — Telephone Encounter (Signed)
Patient has MRI appointment on Friday at [facility]. All paper work was faxed and received.

## 2015-06-06 NOTE — Telephone Encounter (Signed)
Patient has appointment this Friday to have cardiac MRI at [facility]

## 2015-06-25 ENCOUNTER — Ambulatory Visit (INDEPENDENT_AMBULATORY_CARE_PROVIDER_SITE_OTHER): Payer: BLUE CROSS/BLUE SHIELD | Admitting: Cardiovascular Disease

## 2015-06-25 ENCOUNTER — Other Ambulatory Visit: Payer: Self-pay | Admitting: Cardiovascular Disease

## 2015-06-25 VITALS — BP 110/78 | HR 132 | Ht 77.0 in | Wt 311.1 lb

## 2015-06-25 DIAGNOSIS — R000 Tachycardia, unspecified: Secondary | ICD-10-CM | POA: Diagnosis not present

## 2015-06-25 DIAGNOSIS — I509 Heart failure, unspecified: Secondary | ICD-10-CM | POA: Diagnosis not present

## 2015-06-25 LAB — CBC WITH DIFFERENTIAL/PLATELET
BASOS PCT: 1 %
Basophils Absolute: 110 cells/uL (ref 0–200)
EOS ABS: 770 {cells}/uL — AB (ref 15–500)
Eosinophils Relative: 7 %
HEMATOCRIT: 48.3 % (ref 38.5–50.0)
Hemoglobin: 16.3 g/dL (ref 13.2–17.1)
Lymphocytes Relative: 32 %
Lymphs Abs: 3520 cells/uL (ref 850–3900)
MCH: 28.6 pg (ref 27.0–33.0)
MCHC: 33.7 g/dL (ref 32.0–36.0)
MCV: 84.9 fL (ref 80.0–100.0)
MONO ABS: 1100 {cells}/uL — AB (ref 200–950)
MPV: 13.1 fL — AB (ref 7.5–12.5)
Monocytes Relative: 10 %
NEUTROS PCT: 50 %
Neutro Abs: 5500 cells/uL (ref 1500–7800)
Platelets: 242 10*3/uL (ref 140–400)
RBC: 5.69 MIL/uL (ref 4.20–5.80)
RDW: 14.1 % (ref 11.0–15.0)
WBC: 11 10*3/uL — ABNORMAL HIGH (ref 3.8–10.8)

## 2015-06-25 LAB — BASIC METABOLIC PANEL
BUN: 19 mg/dL (ref 7–25)
CHLORIDE: 106 mmol/L (ref 98–110)
CO2: 26 mmol/L (ref 20–31)
Calcium: 9 mg/dL (ref 8.6–10.3)
Creat: 1.19 mg/dL (ref 0.70–1.33)
GLUCOSE: 92 mg/dL (ref 65–99)
POTASSIUM: 4.3 mmol/L (ref 3.5–5.3)
Sodium: 142 mmol/L (ref 135–146)

## 2015-06-25 LAB — PROTIME-INR
INR: 1.03 (ref <1.50)
Prothrombin Time: 13.6 seconds (ref 11.6–15.2)

## 2015-06-25 MED ORDER — APIXABAN 5 MG PO TABS: 5.0000 mg | ORAL_TABLET | Freq: Two times a day (BID) | ORAL | Status: DC

## 2015-06-25 MED ORDER — CARVEDILOL 25 MG PO TABS: 25.0000 mg | ORAL_TABLET | Freq: Two times a day (BID) | ORAL | Status: DC

## 2015-06-25 NOTE — Progress Notes (Signed)
Patient ID: Harold Park, male   DOB: November 10, 1965, 50 y.o.   MRN: [id number] Harold Park is a 50 y.o. male with NICM with EF [id number], chronic systolic CHF, parox AFlutter with RVR (01/2010) and HTN. He had a cath in 01/2010 demonstrating normal cors, EF 25-30% and moderate pulmonary HTN. Echo done in 01/2010 demonstrated EF 30-35%, mild MR, mild LAE, mild RVE. He was placed on demadex and metolazone several months ago due to worsening CHF with good improvement. Long discussion regarding weight and activity. Will refer to cardiac rehab. Discussed low carb diet. Reviewed echo 06/03/10 EF improved 40-45% with mild LVE and trivial MR.   Lat visit K 2.8 torsemide decreased and aldactone added with improvement  Lab Results  Component Value Date   CREATININE 1.02 06/04/2015   BUN 18 06/04/2015   NA 143 06/04/2015   K 4.1 06/04/2015   CL 107 06/04/2015   CO2 25 06/04/2015    Reviewed MRI done [facility] ( less expensive than [facility])  Date:  06/08/15  EF 20%   EDV 381 ESV 303    Aorta at SV 4.3 cm Ascending aorta 3.7 cm   Feeling poorly Found to be in rapid afib today rate 132  Discussed need for anticoagulation , better rate control and [place]  Will start Eliquis Increase Coreg and check labs.  Will try to arrange TEE/DCC Friday  Also discussed f/u with CHF clinic to possibly start entresto     ROS: Denies fever, malais, weight loss, blurry vision, decreased visual acuity, cough, sputum, SOB, hemoptysis, pleuritic pain, palpitaitons, heartburn, abdominal pain, melena, lower extremity edema, claudication, or rash.  All other systems reviewed and negative  There were no vitals filed for this visit.  General: Affect appropriate Obese white male  HEENT: normal Neck supple with no adenopathy JVP normal no bruits no thyromegaly Lungs clear with no wheezing and good diaphragmatic motion Heart:  S1/S2 no murmur, no rub, gallop or click PMI normal Abdomen: benighn, BS positve, no tenderness, no  AAA no bruit.  No HSM or HJR Distal pulses intact with no bruits No edema Neuro non-focal Skin warm and dry No muscular weakness   Current Outpatient Prescriptions  Medication Sig Dispense Refill  . aspirin 81 MG tablet Take 81 mg by mouth daily.    . carvedilol (COREG) 25 MG tablet TAKE 1/2 TABLET BY MOUTH TWICE DAILY 90 tablet 1  . diazepam (VALIUM) 5 MG tablet Take 1 tablet (5 mg) by mouth two hours before MRI. Do not drive while taking this medication. 4 tablet 0  . DULoxetine (CYMBALTA) 20 MG capsule TAKE 1 CAPSULE (20 MG TOTAL) BY MOUTH DAILY. 90 capsule 3  . ibuprofen (ADVIL,MOTRIN) 200 MG tablet Take 200 mg by mouth every 6 (six) hours as needed for mild pain or moderate pain.     Marland Kitchen KLOR-CON M20 20 MEQ tablet TAKE 3 TABLETS BY MOUTH TWICE A DAY 540 tablet 3  . losartan (COZAAR) 25 MG tablet TAKE 1 TABLET (25 MG TOTAL) BY MOUTH DAILY. 90 tablet 2  . methocarbamol (ROBAXIN) 500 MG tablet Take 500 mg by mouth 2 (two) times daily. Take as directed for fluid rentention    . metolazone (ZAROXOLYN) 5 MG tablet Take 5 mg by mouth daily as needed (fluid?).     Marland Kitchen potassium chloride SA (KLOR-CON M20) 20 MEQ tablet Take 3 tablets (60 mEq total) by mouth 2 (two) times daily. 540 tablet 3  . spironolactone (ALDACTONE) 25 MG tablet TAKE 1 TABLET (  25 MG TOTAL) BY MOUTH DAILY. 90 tablet 0  . torsemide (DEMADEX) 20 MG tablet Take 1 tablet (20 mg total) by mouth daily.     No current facility-administered medications for this visit.    Allergies  Review of patient's allergies indicates no known allergies.  Electrocardiogram:  12/22/12  SR rate 87 LAD pulmonary disease pattern possible old IMI  02/2014  SR rate 106  LAE LAD ICRBBB  ECG 06/25/15  Rapid afib rate 132  Nonspecific ST changes   Assessment and Plan CHF:  Functional class 3 due to rapid afib.  Increase coreg f/u CHF clinic consider Entresto HTN:  Well controlled.  Continue current medications and low sodium Dash type diet.    Afib:   Rapid will  increase coreg Discussed starting Eliquis  Labs today TEE [facility] Friday with Dr Marlou Porch Orders written lab called 9:00 am case.  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

## 2015-06-25 NOTE — Patient Instructions (Addendum)
Medication Instructions:  Your physician has recommended you make the following change in your medication:  1-START Eliquis 5 mg by mouth twice daily 2-INCREASE Coreg 25 mg by mouth twice daily  Labwork: Your physician recommends that you have lab work today. BMET, CBC, INR/PT   Testing/Procedures: Your physician has requested that you have a TEE/Cardioversion. During a TEE, sound waves are used to create images of your heart. It provides your doctor with information about the size and shape of your heart and how well your heart's chambers and valves are working. In this test, a transducer is attached to the end of a flexible tube that is guided down you throat and into your esophagus (the tube leading from your mouth to your stomach) to get a more detailed image of your heart. Once the TEE has determined that a blood clot is not present, the cardioversion begins. Electrical Cardioversion uses a jolt of electricity to your heart either through paddles or wired patches attached to your chest. This is a controlled, usually prescheduled, procedure. This procedure is done at the hospital and you are not awake during the procedure. You usually go home the day of the procedure. Please see the instruction sheet given to you today for more information.  Follow-Up: Your physician wants you to follow-up in: 6 months with Dr. [name]. You will receive a reminder letter in the mail two months in advance. If you don't receive a letter, please call our office to schedule the follow-up appointment.  Your physician recommends that you schedule an appointment with [facility].   If you need a refill on your cardiac medications before your next appointment, please call your pharmacy.

## 2015-06-29 ENCOUNTER — Encounter (HOSPITAL_COMMUNITY): Payer: Self-pay | Admitting: *Deleted

## 2015-06-29 ENCOUNTER — Ambulatory Visit (HOSPITAL_COMMUNITY)
Admission: RE | Admit: 2015-06-29 | Discharge: 2015-06-29 | Disposition: A | Discharge status: Home / Self Care | Payer: BLUE CROSS/BLUE SHIELD | Source: Ambulatory Visit | Attending: Cardiology | Admitting: Cardiology

## 2015-06-29 ENCOUNTER — Inpatient Hospital Stay (HOSPITAL_COMMUNITY): Admit: 2015-06-29 | Payer: BLUE CROSS/BLUE SHIELD

## 2015-06-29 ENCOUNTER — Encounter (HOSPITAL_COMMUNITY)
Admission: RE | Disposition: A | Discharge status: Home / Self Care | Payer: Self-pay | Source: Ambulatory Visit | Attending: Cardiology

## 2015-06-29 DIAGNOSIS — I4891 Unspecified atrial fibrillation: Secondary | ICD-10-CM | POA: Insufficient documentation

## 2015-06-29 SURGERY — CANCELLED PROCEDURE

## 2015-06-29 MED ORDER — SODIUM CHLORIDE 0.9 % IV SOLN: INTRAVENOUS | Status: DC

## 2015-06-29 MED ORDER — LACTATED RINGERS IV SOLN: INTRAVENOUS | Status: DC

## 2015-06-29 NOTE — Progress Notes (Signed)
Patient admitted to Endo unit. Patient placed on monitor and suspected to be in NSR. EKG obtained and NSR confirmed with Dr. Marlou Porch. Patient sent home with instructions to closely monitor heart rate and with any signs of returning atrial fibrillation to call MD office. Patient is to keep any follow appointments.

## 2015-07-09 ENCOUNTER — Ambulatory Visit (HOSPITAL_COMMUNITY)
Admission: RE | Admit: 2015-07-09 | Discharge: 2015-07-09 | Disposition: A | Discharge status: Home / Self Care | Payer: BLUE CROSS/BLUE SHIELD | Source: Ambulatory Visit | Attending: Internal Medicine | Admitting: Internal Medicine

## 2015-07-09 ENCOUNTER — Encounter (HOSPITAL_COMMUNITY): Payer: Self-pay | Admitting: Internal Medicine

## 2015-07-09 VITALS — BP 118/88 | HR 83 | Ht 77.0 in | Wt 312.1 lb

## 2015-07-09 DIAGNOSIS — R0683 Snoring: Secondary | ICD-10-CM

## 2015-07-09 DIAGNOSIS — Z7982 Long term (current) use of aspirin: Secondary | ICD-10-CM | POA: Diagnosis not present

## 2015-07-09 DIAGNOSIS — R5383 Other fatigue: Secondary | ICD-10-CM | POA: Diagnosis not present

## 2015-07-09 DIAGNOSIS — Z79899 Other long term (current) drug therapy: Secondary | ICD-10-CM | POA: Insufficient documentation

## 2015-07-09 DIAGNOSIS — I4892 Unspecified atrial flutter: Secondary | ICD-10-CM

## 2015-07-09 DIAGNOSIS — I428 Other cardiomyopathies: Secondary | ICD-10-CM | POA: Insufficient documentation

## 2015-07-09 DIAGNOSIS — Z6837 Body mass index (BMI) 37.0-37.9, adult: Secondary | ICD-10-CM | POA: Diagnosis not present

## 2015-07-09 DIAGNOSIS — Z7901 Long term (current) use of anticoagulants: Secondary | ICD-10-CM | POA: Diagnosis not present

## 2015-07-09 DIAGNOSIS — I5022 Chronic systolic (congestive) heart failure: Secondary | ICD-10-CM | POA: Insufficient documentation

## 2015-07-09 DIAGNOSIS — R0602 Shortness of breath: Secondary | ICD-10-CM | POA: Diagnosis not present

## 2015-07-09 DIAGNOSIS — I480 Paroxysmal atrial fibrillation: Secondary | ICD-10-CM | POA: Insufficient documentation

## 2015-07-09 DIAGNOSIS — Z8249 Family history of ischemic heart disease and other diseases of the circulatory system: Secondary | ICD-10-CM | POA: Diagnosis not present

## 2015-07-09 DIAGNOSIS — I110 Hypertensive heart disease with heart failure: Secondary | ICD-10-CM | POA: Insufficient documentation

## 2015-07-09 MED ORDER — SACUBITRIL-VALSARTAN 49-51 MG PO TABS: 1.0000 | ORAL_TABLET | Freq: Two times a day (BID) | ORAL | Status: DC

## 2015-07-09 NOTE — Patient Instructions (Signed)
Stop Losartan  Start Entresto 49/51 mg Twice daily STARTING WED PM  Labs in 7-10 days  Your physician has recommended that you have a sleep study. This test records several body functions during sleep, including: brain activity, eye movement, oxygen and carbon dioxide blood levels, heart rate and rhythm, breathing rate and rhythm, the flow of air through your mouth and nose, snoring, body muscle movements, and chest and belly movement.  You have been referred to Dr Radford Pax for sleep evaluation  You have been referred to Dr Caryl Comes for Defibrillator   Your physician recommends that you schedule a follow-up appointment in: 4-6 weeks

## 2015-07-09 NOTE — Progress Notes (Signed)
Patient ID: Harold Park, male   DOB: 1965-10-03, 50 y.o.   MRN: [id number]    Advanced Heart Failure Clinic Note   Referring Physician: Josue Hector, MD Primary Care: Eulas Post, MD Primary Cardiologist: Josue Hector, MD  Reason for referral: Class III HF  HPI:  Harold Park is a 50 y.o. male with NICM with EF [id number], chronic systolic CHF, parox A-Flutter with RVR (01/2010) and HTN. He had a cath in 01/2010 demonstrating normal cors, EF 25-30% and moderate pulmonary HTN. Echo done in 01/2010 demonstrated EF 30-35%, mild MR, mild LAE, mild RVE.  Echo 12/04/14 LVEF 30-35%, trivial AI, Mod LAE  cMRI 09/23/12 Moderate LVE with mild LVH, diffuse hypokinesis EF 34%. No infarct or hyper enhancement. Cardiac MRI 06/08/15 LVEF 20%, Moderate LAE and RAE, otherwise normal. Hyper - enhancement imaging was sub-optimal and nondiagnostic  Last seen in [facility] clinic 06/25/15, noted to be in afib RVR 132. Started on eliquis, coreg increase, and TEE/DCCV scheduled for 06/29/15.   Was in NSR on his arrival by EKG so this was cancelled.   He presents today to establish with the HF clinic.  Overall has felt worse over the past few months. Has good and bad days with fatigue.  On a good day, an walk on flat ground without any difficulty. Does get SOB with stairs. Denies CP.  Doesn't ever feel it when his heart is out of rhythm, just has some malaise. Pees well on torsemide.  No orthopnea or PND. + Bendopnea. Gets lightheaded on standing (chronically). No family history of heart problems that he knows of. Snores very loudly, no obvious apneic episodes, but lots of day time sleepiness. Occasionally misses his medicines. Denies bleeding on eliquis. Doesn't really exercise. Walks dog, averages 5000 step a day.  Weight at home ~ 310. Thinks he drinks > 2 L a day. Doesn't really watch salt intake. Has taken metolazone twice in last month, usually uses for orthopnea. Takes an extra torsemide about once a week or  so.   Social: Works in Personal assistant, Lives in [place]. 3 kids, 35 yo girl,41 yo girl, 53 yo boy as of 07/2015.  Review of Systems: [y] = yes, [ ]  = no   General: Weight gain [ ] ; Weight loss [ ] ; Anorexia [ ] ; Fatigue [ ] ; Fever [ ] ; Chills [ ] ; Weakness [ ]   Cardiac: Chest pain/pressure [ ] ; Resting SOB [ ] ; Exertional SOB [y]; Orthopnea [ ] ; Pedal Edema [ ] ; Palpitations [ ] ; Syncope [ ] ; Presyncope [ ] ; Paroxysmal nocturnal dyspnea[ ]   Pulmonary: Cough [ ] ; Wheezing[ ] ; Hemoptysis[ ] ; Sputum [ ] ; Snoring [ ]   GI: Vomiting[ ] ; Dysphagia[ ] ; Melena[ ] ; Hematochezia [ ] ; Heartburn[ ] ; Abdominal pain [ ] ; Constipation [ ] ; Diarrhea [ ] ; BRBPR [ ]   GU: Hematuria[ ] ; Dysuria [ ] ; Nocturia[ ]   Vascular: Pain in legs with walking [ ] ; Pain in feet with lying flat [ ] ; Non-healing sores [ ] ; Stroke [ ] ; TIA [ ] ; Slurred speech [ ] ;  Neuro: Headaches[ ] ; Vertigo[ ] ; Seizures[ ] ; Paresthesias[ ] ;Blurred vision [ ] ; Diplopia [ ] ; Vision changes [ ]   Ortho/Skin: Arthritis [y]; Joint pain [y]; Muscle pain [ ] ; Joint swelling [ ] ; Back Pain [ ] ; Rash [ ]   Psych: Depression[ ] ; Anxiety[ ]   Heme: Bleeding problems [ ] ; Clotting disorders [ ] ; Anemia [ ]   Endocrine: Diabetes [ ] ; Thyroid dysfunction[ ]    Past Medical History  Diagnosis Date  .  NICM (nonischemic cardiomyopathy) ([place])   . Systolic CHF, chronic ([place])   . PAF (paroxysmal atrial fibrillation) ([place])   . HTN (hypertension)   . Chronic bronchitis     Current Outpatient Prescriptions  Medication Sig Dispense Refill  . apixaban (ELIQUIS) 5 MG TABS tablet Take 1 tablet (5 mg total) by mouth 2 (two) times daily. 60 tablet 11  . aspirin 81 MG tablet Take 81 mg by mouth daily.    . carvedilol (COREG) 25 MG tablet Take 1 tablet (25 mg total) by mouth 2 (two) times daily with a meal. 180 tablet 3  . DULoxetine (CYMBALTA) 20 MG capsule TAKE 1 CAPSULE (20 MG TOTAL) BY MOUTH DAILY. 90 capsule 3  . ibuprofen (ADVIL,MOTRIN) 200 MG tablet Take 200 mg  by mouth every 6 (six) hours as needed for mild pain or moderate pain.     Marland Kitchen KLOR-CON M20 20 MEQ tablet TAKE 3 TABLETS BY MOUTH TWICE A DAY 540 tablet 3  . losartan (COZAAR) 25 MG tablet TAKE 1 TABLET (25 MG TOTAL) BY MOUTH DAILY. 90 tablet 2  . methocarbamol (ROBAXIN) 500 MG tablet Take 500 mg by mouth 2 (two) times daily. Take as directed for fluid rentention    . metolazone (ZAROXOLYN) 5 MG tablet Take 5 mg by mouth daily as needed (fluid?).     Marland Kitchen spironolactone (ALDACTONE) 25 MG tablet TAKE 1 TABLET (25 MG TOTAL) BY MOUTH DAILY. 90 tablet 0  . torsemide (DEMADEX) 20 MG tablet Take 1 tablet (20 mg total) by mouth daily.     No current facility-administered medications for this encounter.    No Known Allergies    Social History   Social History  . Marital Status: Married    Spouse Name: N/A  . Number of Children: 3  . Years of Education: N/A   Occupational History  . realtor    Social History Main Topics  . Smoking status: Never Smoker   . Smokeless tobacco: Not on file  . Alcohol Use: Yes     Comment: occasional  . Drug Use: Not on file  . Sexual Activity: Not on file   Other Topics Concern  . Not on file   Social History Narrative      Family History  Problem Relation Age of Onset  . Cancer Mother     breast  . Hypertension    . Heart disease      Filed Vitals:   07/09/15 1347  BP: 118/88  Pulse: 83  Height: 6\' 5"  (1.956 m)  Weight: 312 lb 1.9 oz (141.577 kg)  SpO2: 91%    PHYSICAL EXAM: General:  Well appearing. No respiratory difficulty HEENT: normal Neck: supple. no JVD. Carotids 2+ bilat; no bruits. No thyromegaly or nodule noted.  Cor: PMI nondisplaced. RRR. No rubs, gallops or murmurs. Lungs: clear Abdomen: Obese, soft, nontender, nondistended. No hepatosplenomegaly. No bruits or masses. Good bowel sounds. Extremities: no cyanosis, clubbing, rash, edema Neuro: alert & oriented x 3, cranial nerves grossly intact. moves all 4 extremities w/o  difficulty. Affect pleasant.  ECG: 07/09/15 NSR 80, ?LAE, LAD, Prolonged QT 434/500 ms  ASSESSMENT & PLAN:  1. Chronic systolic HF - NICM - Echo 12/04/14 LVEF 30-35%, trivial AI, Mod LAE - NYHA Class II- early III 2. Paroxysmal, Afib 3. HTN 4. 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  6. Snoring/daytime fatigue  Unclear etiology for his HF.  cMRI x 2 unremarkable apart from low EF (  most recently 06/08/15 [facility]). Clean coronaries in 2011 with no ACS symptoms and stable lipids.   Has had paroxysmal episodes of Afib/flutter and has heavy snoring.  Will send for sleep study.   Needs to exercise. Will send referral to [facility] HF program. Will plan for CPX after med optimization.   Stop losartan. Start Entresto 49/51 mg BID.  Check BMET 7-10 days.  Follow up in 4-6 weeks.   Legrand Como [address] [place], PA-C 07/09/2015 2:21 PM     Patient seen and examined with Oda Kilts, PA-C. We discussed all aspects of the encounter. I agree with the assessment and plan as stated above.   Records hear and from [facility] reviewed. He has NICM with EF 20% by recent cMRI. OVerall doing ok from functional standpoint NYHA II-III. Volume status stable. Will switch losartan to Entresto. Refer to HF exercise program at [facility] that is complete will get CPX to further risk stratify. Refer for sleep study. Will need ICD if EF doesn't improve soon.   Total time spent 45 minutes. Over half that time spent discussing above.   , ,MD 1:55 PM

## 2015-07-20 ENCOUNTER — Ambulatory Visit (HOSPITAL_COMMUNITY)
Admission: RE | Admit: 2015-07-20 | Discharge: 2015-07-20 | Disposition: A | Discharge status: Home / Self Care | Payer: BLUE CROSS/BLUE SHIELD | Source: Ambulatory Visit | Attending: Internal Medicine | Admitting: Internal Medicine

## 2015-07-20 DIAGNOSIS — I5022 Chronic systolic (congestive) heart failure: Secondary | ICD-10-CM | POA: Diagnosis not present

## 2015-07-20 LAB — BASIC METABOLIC PANEL
Anion gap: 11 (ref 5–15)
BUN: 16 mg/dL (ref 6–20)
CALCIUM: 8.9 mg/dL (ref 8.9–10.3)
CO2: 19 mmol/L — ABNORMAL LOW (ref 22–32)
Chloride: 110 mmol/L (ref 101–111)
Creatinine, Ser: 0.86 mg/dL (ref 0.61–1.24)
GFR calc Af Amer: >60 mL/min (ref >60)
GLUCOSE: 100 mg/dL — AB (ref 65–99)
Potassium: 4.1 mmol/L (ref 3.5–5.1)
SODIUM: 140 mmol/L (ref 135–145)

## 2015-07-31 ENCOUNTER — Other Ambulatory Visit: Payer: Self-pay | Admitting: Cardiovascular Disease

## 2015-08-03 ENCOUNTER — Other Ambulatory Visit: Payer: Self-pay | Admitting: Cardiovascular Disease

## 2015-08-08 ENCOUNTER — Encounter: Payer: Self-pay | Admitting: Internal Medicine

## 2015-08-12 ENCOUNTER — Ambulatory Visit (HOSPITAL_BASED_OUTPATIENT_CLINIC_OR_DEPARTMENT_OTHER): Payer: BLUE CROSS/BLUE SHIELD | Attending: Internal Medicine | Admitting: Cardiology

## 2015-08-12 VITALS — Ht 77.0 in | Wt 305.0 lb

## 2015-08-12 DIAGNOSIS — Z7982 Long term (current) use of aspirin: Secondary | ICD-10-CM | POA: Insufficient documentation

## 2015-08-12 DIAGNOSIS — E669 Obesity, unspecified: Secondary | ICD-10-CM | POA: Diagnosis not present

## 2015-08-12 DIAGNOSIS — Z6838 Body mass index (BMI) 38.0-38.9, adult: Secondary | ICD-10-CM | POA: Diagnosis not present

## 2015-08-12 DIAGNOSIS — R5383 Other fatigue: Secondary | ICD-10-CM | POA: Diagnosis not present

## 2015-08-12 DIAGNOSIS — Z79899 Other long term (current) drug therapy: Secondary | ICD-10-CM | POA: Diagnosis not present

## 2015-08-12 DIAGNOSIS — G4733 Obstructive sleep apnea (adult) (pediatric): Secondary | ICD-10-CM | POA: Insufficient documentation

## 2015-08-12 DIAGNOSIS — I493 Ventricular premature depolarization: Secondary | ICD-10-CM | POA: Diagnosis not present

## 2015-08-12 DIAGNOSIS — I509 Heart failure, unspecified: Secondary | ICD-10-CM | POA: Diagnosis not present

## 2015-08-12 DIAGNOSIS — R0683 Snoring: Secondary | ICD-10-CM

## 2015-08-12 DIAGNOSIS — I110 Hypertensive heart disease with heart failure: Secondary | ICD-10-CM | POA: Insufficient documentation

## 2015-08-12 HISTORY — PX: SPLIT NIGHT STUDY: SLE1000

## 2015-08-13 ENCOUNTER — Encounter: Payer: Self-pay | Admitting: Internal Medicine

## 2015-08-13 ENCOUNTER — Ambulatory Visit (INDEPENDENT_AMBULATORY_CARE_PROVIDER_SITE_OTHER): Payer: BLUE CROSS/BLUE SHIELD | Admitting: Internal Medicine

## 2015-08-13 VITALS — BP 118/74 | HR 87 | Ht 77.0 in | Wt 311.6 lb

## 2015-08-13 DIAGNOSIS — I493 Ventricular premature depolarization: Secondary | ICD-10-CM

## 2015-08-13 DIAGNOSIS — I4892 Unspecified atrial flutter: Secondary | ICD-10-CM | POA: Diagnosis not present

## 2015-08-13 DIAGNOSIS — I5022 Chronic systolic (congestive) heart failure: Secondary | ICD-10-CM | POA: Diagnosis not present

## 2015-08-13 NOTE — Progress Notes (Signed)
ELECTROPHYSIOLOGY CONSULT NOTE  Patient ID: Harold Park, MRN: [id number], DOB/AGE: June 16, 1965 50 y.o. Admit date: (Not on file) Date of Consult: 08/13/2015  Primary Physician: Eulas Post, MD Primary Cardiologist: [facility]  Chief Complaint: consideration of ICD   HPI Harold Park is a 50 y.o. male  Referred for consideration of an ICD.  He has a long-standing history of nonischemic cardiomyopathy. 3/17 he began to develop progressive symptoms of heart failure manifested by nocturnal dyspnea orthopnea and peripheral edema. At that time he was found to have  atrial fibrillation with a rapid rate and scheduled for cardioversion with TEE guidance later that week and by that time had reverted to sinus rhythm  He was then referred to the heart failure clinic. Records were reviewed. He is going to be referred for sleep study. Losartan will switch to [facility].  MRI 2014 had demonstrated an ejection fraction of 34% without hyperenhancement Repeat MRI 3/17 LVEF 20% Echocardiogram 9/16 EF 30-35% with moderate LAE (46/1.6/24)  He underwent a sleep study last night   Past Medical History  Diagnosis Date  . NICM (nonischemic cardiomyopathy) ([place])   . Systolic CHF, chronic ([place])   . PAF (paroxysmal atrial fibrillation) ([place])   . HTN (hypertension)   . Chronic bronchitis       Surgical History:  Past Surgical History  Procedure Laterality Date  . Cardiac catheterization    . Splenectomy    . Appendectomy    . Lipoma resection      arms     Home Meds: Prior to Admission medications   Medication Sig Start Date End Date Taking? Authorizing Provider  apixaban (ELIQUIS) 5 MG TABS tablet Take 1 tablet (5 mg total) by mouth 2 (two) times daily. 06/25/15  Yes Josue Hector, MD  aspirin 81 MG tablet Take 81 mg by mouth daily.   Yes Historical Provider, MD  carvedilol (COREG) 25 MG tablet Take 1 tablet (25 mg total) by mouth 2 (two) times daily with a  meal. 06/25/15  Yes Josue Hector, MD  DULoxetine (CYMBALTA) 20 MG capsule TAKE 1 CAPSULE (20 MG TOTAL) BY MOUTH DAILY. 05/08/15  Yes Josue Hector, MD  ibuprofen (ADVIL,MOTRIN) 200 MG tablet Take 200 mg by mouth every 6 (six) hours as needed for mild pain or moderate pain.    Yes Historical Provider, MD  KLOR-CON M20 20 MEQ tablet TAKE 3 TABLETS BY MOUTH TWICE A DAY 12/13/14  Yes Josue Hector, MD  methocarbamol (ROBAXIN) 500 MG tablet Take 500 mg by mouth 2 (two) times daily. Take as directed for fluid rentention   Yes Historical Provider, MD  metolazone (ZAROXOLYN) 5 MG tablet Take 5 mg by mouth daily as needed (fluid?).    Yes Historical Provider, MD  sacubitril-valsartan (ENTRESTO) 49-51 MG Take 1 tablet by mouth 2 (two) times daily. 07/09/15  Yes Jolaine Artist, MD  spironolactone (ALDACTONE) 25 MG tablet TAKE 1 TABLET (25 MG TOTAL) BY MOUTH DAILY. 08/03/15  Yes Josue Hector, MD  torsemide (DEMADEX) 20 MG tablet Take 1 tablet (20 mg total) by mouth daily. 08/29/14  Yes Josue Hector, MD    Allergies: No Known Allergies  Social History   Social History  . Marital Status: Married    Spouse Name: N/A  . Number of Children: 3  . Years of Education: N/A   Occupational History  . realtor    Social History Main Topics  . Smoking status: Never  Smoker   . Smokeless tobacco: Not on file  . Alcohol Use: Yes     Comment: occasional  . Drug Use: Not on file  . Sexual Activity: Not on file   Other Topics Concern  . Not on file   Social History Narrative     Family History  Problem Relation Age of Onset  . Cancer Mother     breast  . Hypertension    . Heart disease       ROS:  Please see the history of present illness.     All other systems reviewed and negative.    Physical Exam * Blood pressure 118/74, pulse 87, height 6\' 5"  (1.956 m), weight 311 lb 9.6 oz (141.341 kg). General: Well developed, very large   male in no acute distress. Head: Normocephalic, atraumatic,  sclera non-icteric, no xanthomas, nares are without discharge. EENT: normal  Lymph Nodes:  none Neck: Negative for carotid bruits. JVD not elevated. Back:without scoliosis kyphosis  Lungs: Clear bilaterally to auscultation without wheezes, rales, or rhonchi. Breathing is unlabored. Heart: RRR with S1 S2. No  murmur . No rubs, or gallops appreciated. Abdomen: Soft, non-tender, non-distended with normoactive bowel sounds. No hepatomegaly. No rebound/guarding. No obvious abdominal masses. Msk:  Strength and tone appear normal for age. Extremities: No clubbing or cyanosis. No  edema.  Distal pedal pulses are 2+ and equal bilaterally. Skin: Warm and Dry Neuro: Alert and oriented X 3. CN III-XII intact Grossly normal sensory and motor function . Psych:  Responds to questions appropriately with a normal affect.      Labs: Cardiac Enzymes No results for input(s): CKTOTAL, CKMB, TROPONINI in the last 72 hours. CBC Lab Results  Component Value Date   WBC 11.0* 06/25/2015   HGB 16.3 06/25/2015   HCT 48.3 06/25/2015   MCV 84.9 06/25/2015   PLT 242 06/25/2015   PROTIME: No results for input(s): LABPROT, INR in the last 72 hours. Chemistry No results for input(s): NA, K, CL, CO2, BUN, CREATININE, CALCIUM, PROT, BILITOT, ALKPHOS, ALT, AST, GLUCOSE in the last 168 hours.  Invalid input(s): LABALBU Lipids Lab Results  Component Value Date   CHOL  01/24/2010    111        ATP III CLASSIFICATION:  <200     mg/dL   Desirable  200-239  mg/dL   Borderline High  >=240    mg/dL   High          HDL 28* 01/24/2010   LDLCALC  01/24/2010    59        Total Cholesterol/HDL:CHD Risk Coronary Heart Disease Risk Table                     Men   Women  1/2 Average Risk   3.4   3.3  Average Risk       5.0   4.4  2 X Average Risk   9.6   7.1  3 X Average Risk  23.4   11.0        Use the calculated Patient Ratio above and the CHD Risk Table to determine the patient's CHD Risk.        ATP III  CLASSIFICATION (LDL):  <100     mg/dL   Optimal  100-129  mg/dL   Near or Above                    Optimal  130-159  mg/dL  Borderline  160-189  mg/dL   High  >190     mg/dL   Very High   TRIG 120 01/24/2010   BNP PRO B NATRIURETIC PEPTIDE (BNP)  Date/Time Value Ref Range Status  08/28/2014 10:14 AM 64.0 0.0 - 100.0 pg/mL Final  11/20/2010 11:34 AM 13.0 0.0 - 100.0 pg/mL Final  07/31/2010 11:20 AM 10.0 0.0 - 100.0 pg/mL Final  04/08/2010 11:14 AM 22.4 0.0 - 100.0 pg/mL Final   Thyroid Function Tests: No results for input(s): TSH, T4TOTAL, T3FREE, THYROIDAB in the last 72 hours.  Invalid input(s): FREET3 Miscellaneous No results found for: DDIMER  Radiology/Studies:  No results found.  EKG:  Sinus rhythm at 92 Intervals 20/12/40 PVCs with 2 morphologies 1 inferior axis with superior axis at a rate of 18%   Assessment and Plan:   Nonischemic cardio myopathy  Congestive heart failure-chronic-systolic    atrial fibrillation-paroxysmal   PVCs-frequent    The patient has nonischemic cardiomyopathy of November duration and has long-standing PVCs. Interestingly, his MRI from 2014 demonstrated no significant enhancement raising the possibility that if these are related to PVCs that are some chance of recovery at least at that time. We will go ahead and quantitate his PVC burden as the ECG obtained 5/1 demonstrated no PVCs. It is persistently present today at high density.  It is also possible that atrial fibrillation has been responsible and contributing to his congestive failure as well his myopathy. I would anticipate that he was in sinus rhythm that way when he had his MRI [facility] 0 able to gate the study  I will be in touch with Dr. Reine Just as to whether we should try Ivabradine  We will hold off on a recommendation regarding the defibrillator until we have the aforementioned results.  I've also instructed as to how to take his pulse. The report revealed that this patient which  is normal I is sinus with PVCs for atrial fibrillation. We will continue him on ELIQUIS. Stop his ASA        Virl Axe

## 2015-08-13 NOTE — Patient Instructions (Addendum)
Medication Instructions: - Your physician recommends that you continue on your current medications as directed x stop aspirin. Please refer to the Current Medication list given to you today.  Labwork: - none  Procedures/Testing: - Your physician has recommended that you wear a 48 hour holter monitor. Holter monitors are medical devices that record the heart's electrical activity. Doctors most often use these monitors to diagnose arrhythmias. Arrhythmias are problems with the speed or rhythm of the heartbeat. The monitor is a small, portable device. You can wear one while you do your normal daily activities. This is usually used to diagnose what is causing palpitations/syncope (passing out).  Follow-Up: - pending results of the holter monitor  Any Additional Special Instructions Will Be Listed Below (If Applicable).     If you need a refill on your cardiac medications before your next appointment, please call your pharmacy.

## 2015-08-15 ENCOUNTER — Ambulatory Visit (INDEPENDENT_AMBULATORY_CARE_PROVIDER_SITE_OTHER): Payer: BLUE CROSS/BLUE SHIELD

## 2015-08-15 DIAGNOSIS — I493 Ventricular premature depolarization: Secondary | ICD-10-CM | POA: Diagnosis not present

## 2015-08-19 ENCOUNTER — Encounter (HOSPITAL_BASED_OUTPATIENT_CLINIC_OR_DEPARTMENT_OTHER): Payer: Self-pay | Admitting: Cardiology

## 2015-08-19 ENCOUNTER — Telehealth: Payer: Self-pay | Admitting: Cardiology

## 2015-08-19 DIAGNOSIS — G4733 Obstructive sleep apnea (adult) (pediatric): Secondary | ICD-10-CM | POA: Insufficient documentation

## 2015-08-19 HISTORY — DX: Obstructive sleep apnea (adult) (pediatric): G47.33

## 2015-08-19 NOTE — Telephone Encounter (Signed)
Please let patient know that they have significant sleep apnea and had successful CPAP titration and will be set up with CPAP unit.  Please let DME know that order is in EPIC.  Please set patient up for OV in 10 weeks 

## 2015-08-19 NOTE — Procedures (Signed)
Patient Name: Harold Park, Harold Park MRN: [id number] Study Date: 08/12/2015 Gender: Male D.O.B: 05-19-1965 Age (years): 66 Referring Provider: Shaune Pascal Bensimhon Interpreting Physician: Fransico Him MD, ABSM RPSGT: Baxter Flattery  BMI: 38 Height (inches): 75 Weight (lbs): 305 Neck Size: 20.00  CLINICAL INFORMATION SLEEP STUDY TECHNIQUE Sleep Study Type: Split Night CPAP Indication for sleep study: Congestive Heart Failure, Fatigue, Hypertension, Obesity, OSA, Snoring, Witnessed Apneas Epworth Sleepiness Score: 8  SLEEP STUDY TECHNIQUE As per the AASM Manual for the Scoring of Sleep and Associated Events v2.3 (April 2016) with a hypopnea requiring 4% desaturations. The channels recorded and monitored were frontal, central and occipital EEG, electrooculogram (EOG), submentalis EMG (chin), nasal and oral airflow, thoracic and abdominal wall motion, anterior tibialis EMG, snore microphone, electrocardiogram, and pulse oximetry. Continuous positive airway pressure (CPAP) was initiated when the patient met split night criteria and was titrated according to treat sleep-disordered breathing.  MEDICATIONS Medications taken by the patient : CARVEDILOL, BAYER ASPIRIN, SPIRONOLACTONE, POTASSIUM CITRATE, IBUPROFEN, SACUBITRIL-VALSARTAN  Medications administered by patient during sleep study : No sleep medicine administered.  RESPIRATORY PARAMETERS Diagnostic Total AHI (/hr): 64.4  RDI (/hr):64.4   OA Index (/hr): 31.3  CA Index (/hr):0.0 REM AHI (/hr): N/A  NREM AHI (/hr):64.4  Supine AHI(/hr):88.3  Non-supine AHI (/hr):44.59 Min O2 Sat (%):81.00  Mean O2 (%):91.03  Time below 88% (min):26.2    Titration Optimal Pressure (cm):13  AHI at Optimal Pressure (/hr):3.1  Min O2 at Optimal Pressure (%):92.0 Supine % at Optimal (%):69  Sleep % at Optimal (%):87    SLEEP ARCHITECTURE The recording time for the entire night was 421.9 minutes. During a baseline period of 190.6 minutes, the  patient slept for 137.9 minutes in REM and nonREM, yielding a reduced sleep efficiency of 72.3%. Sleep onset after lights out was 51.3 minutes with a REM latency of N/A minutes. The patient spent 5.80% of the night in stage N1 sleep, 83.32% in stage N2 sleep, 10.88% in stage N3 and 0.00% in REM.   During the titration period of 228.2 minutes, the patient slept for 205.0 minutes in REM and nonREM, yielding a sleep efficiency of 89.8%. Sleep onset after CPAP initiation was 5.1 minutes with a REM latency of N/A minutes. The patient spent 13.41% of the night in stage N1 sleep, 83.17% in stage N2 sleep, 3.41% in stage N3 and 0.00% in REM.  CARDIAC DATA The 2 lead EKG demonstrated sinus rhythm. The mean heart rate was 8.39 beats per minute. Other EKG findings include: Frequent PVCs.  LEG MOVEMENT DATA The total Periodic Limb Movements of Sleep (PLMS) were 369. The PLMS index was 64.55 .  IMPRESSIONS - Severe obstructive sleep apnea occurred during the diagnostic portion of the study (AHI = 64.4/hour). An optimal PAP pressure was selected for this patient ( 13 cm of water) - No significant central sleep apnea occurred during the diagnostic portion of the study (CAI = 0.0/hour). - Oxygen desaturation was noted during the diagnostic portion of the study (Min O2 = 81.00%). - No snoring was audible during the diagnostic portion of the study. - Frequent PVCs were noted during this study. - Severe periodic limb movements of sleep occurred during the study.  DIAGNOSIS - Obstructive Sleep Apnea (327.23 [G47.33 ICD-10]) - PVCs  RECOMMENDATIONS - Trial of CPAP therapy on 13 cm H2O with a Large size Fisher&Paykel Full Face Mask Simplus mask and heated humidification. - Avoid alcohol, sedatives and other CNS depressants that may worsen sleep apnea and disrupt normal sleep  architecture. - Sleep hygiene should be reviewed to assess factors that may improve sleep quality. - Weight management and regular exercise  should be initiated or continued. - Return to Sleep Center for re-evaluation after 10 weeks of therapy   [place], [place] [facility]  ELECTRONICALLY SIGNED ON:  08/19/2015, 10:27 PM [place] PH: [phone]   FX: [phone] [place]

## 2015-08-24 NOTE — Telephone Encounter (Signed)
Patient informed of information.  Stated verbal understanding. Orders sent to [facility].  Once patient starts CPAP Therapy, I will schedule 10 week follow-up visit.

## 2015-08-28 ENCOUNTER — Ambulatory Visit (HOSPITAL_COMMUNITY)
Admission: RE | Admit: 2015-08-28 | Discharge: 2015-08-28 | Disposition: A | Discharge status: Home / Self Care | Payer: BLUE CROSS/BLUE SHIELD | Source: Ambulatory Visit | Attending: Internal Medicine | Admitting: Internal Medicine

## 2015-08-28 ENCOUNTER — Encounter (HOSPITAL_COMMUNITY): Payer: Self-pay | Admitting: Internal Medicine

## 2015-08-28 VITALS — BP 130/86 | HR 87 | Wt 311.2 lb

## 2015-08-28 DIAGNOSIS — I493 Ventricular premature depolarization: Secondary | ICD-10-CM | POA: Diagnosis not present

## 2015-08-28 DIAGNOSIS — Z79899 Other long term (current) drug therapy: Secondary | ICD-10-CM | POA: Insufficient documentation

## 2015-08-28 DIAGNOSIS — Z6836 Body mass index (BMI) 36.0-36.9, adult: Secondary | ICD-10-CM | POA: Diagnosis not present

## 2015-08-28 DIAGNOSIS — I480 Paroxysmal atrial fibrillation: Secondary | ICD-10-CM | POA: Diagnosis not present

## 2015-08-28 DIAGNOSIS — I5022 Chronic systolic (congestive) heart failure: Secondary | ICD-10-CM | POA: Diagnosis not present

## 2015-08-28 DIAGNOSIS — I428 Other cardiomyopathies: Secondary | ICD-10-CM | POA: Diagnosis not present

## 2015-08-28 DIAGNOSIS — I110 Hypertensive heart disease with heart failure: Secondary | ICD-10-CM | POA: Diagnosis not present

## 2015-08-28 DIAGNOSIS — G4733 Obstructive sleep apnea (adult) (pediatric): Secondary | ICD-10-CM | POA: Insufficient documentation

## 2015-08-28 DIAGNOSIS — Z803 Family history of malignant neoplasm of breast: Secondary | ICD-10-CM | POA: Diagnosis not present

## 2015-08-28 DIAGNOSIS — Z7901 Long term (current) use of anticoagulants: Secondary | ICD-10-CM | POA: Diagnosis not present

## 2015-08-28 MED ORDER — RANOLAZINE ER 500 MG PO TB12: 500.0000 mg | ORAL_TABLET | Freq: Two times a day (BID) | ORAL | Status: DC

## 2015-08-28 MED ORDER — SACUBITRIL-VALSARTAN 97-103 MG PO TABS: 1.0000 | ORAL_TABLET | Freq: Two times a day (BID) | ORAL | Status: DC

## 2015-08-28 NOTE — Patient Instructions (Signed)
INCREASE Entresto to 97/103 mg tablet twice daily.  START Ranexa 500 mg tablet twice daily.  Follow up 2 months.  Do the following things EVERYDAY: 1) Weigh yourself in the morning before breakfast. Write it down and keep it in a log. 2) Take your medicines as prescribed 3) Eat low salt foods-Limit salt (sodium) to 2000 mg per day.  4) Stay as active as you can everyday 5) Limit all fluids for the day to less than 2 liters

## 2015-08-28 NOTE — Progress Notes (Signed)
Patient ID: Harold Park, male   DOB: Aug 24, 1965, 50 y.o.   MRN: [id number] Patient ID: Harold Park, male   DOB: 05/22/1965, 50 y.o.   MRN: [id number]    Advanced Heart Failure Clinic Note   Referring Physician: Josue Hector, MD Primary Care: Eulas Post, MD Primary Cardiologist: Josue Hector, MD  Reason for referral: Class III HF  HPI:  Harold Park is a 50 y.o. male with NICM with EF 99991111, chronic systolic CHF, parox A-Flutter with RVR (01/2010) and HTN. He had a cath in 01/2010 demonstrating normal cors, EF 25-30% and moderate pulmonary HTN. Echo done in 01/2010 demonstrated EF 30-35%, mild MR, mild LAE, mild RVE.  Echo 12/04/14 LVEF 30-35%, trivial AI, Mod LAE  cMRI 09/23/12 Moderate LVE with mild LVH, diffuse hypokinesis EF 34%. No infarct or hyper enhancement. Cardiac MRI 06/08/15 LVEF 20%, Moderate LAE and RAE, otherwise normal. Hyper - enhancement imaging was sub-optimal and nondiagnostic  Last seen in [facility] clinic 06/25/15, noted to be in afib RVR 132. Started on eliquis, coreg increased, and TEE/DCCV scheduled for 06/29/15.   Was in NSR on his arrival by EKG so this was cancelled.   We saw him for the first time in May 2017. At that time we switched losartan to [facility] and referred to Juan Quam and had sleep study. Also saw Dr. Caryl Comes in EP. Holter monitor placed to quantify PVCs. I reviewed personally - 6.3%. Unable to tolerate amio in past due to severe nausea. Also walked the PTI 5k. Had sleep study with severe OSA 64/hr. Pending placement of CPAP. Denies edema, orthopnea or PND  Social: Works in Personal assistant, Lives in [place]. 3 kids, 44 yo girl,61 yo girl, 81 yo boy as of 07/2015.  Review of Systems: [y] = yes, [ ]  = no   General: Weight gain [ ] ; Weight loss [ ] ; Anorexia [ ] ; Fatigue [ ] ; Fever [ ] ; Chills [ ] ; Weakness [ ]   Cardiac: Chest pain/pressure [ ] ; Resting SOB [ ] ; Exertional SOB [y]; Orthopnea [ ] ; Pedal Edema [ ] ; Palpitations [ ] ; Syncope [ ] ;  Presyncope [ ] ; Paroxysmal nocturnal dyspnea[ ]   Pulmonary: Cough [ ] ; Wheezing[ ] ; Hemoptysis[ ] ; Sputum [ ] ; Snoring [ ]   GI: Vomiting[ ] ; Dysphagia[ ] ; Melena[ ] ; Hematochezia [ ] ; Heartburn[ ] ; Abdominal pain [ ] ; Constipation [ ] ; Diarrhea [ ] ; BRBPR [ ]   GU: Hematuria[ ] ; Dysuria [ ] ; Nocturia[ ]   Vascular: Pain in legs with walking [ ] ; Pain in feet with lying flat [ ] ; Non-healing sores [ ] ; Stroke [ ] ; TIA [ ] ; Slurred speech [ ] ;  Neuro: Headaches[ ] ; Vertigo[ ] ; Seizures[ ] ; Paresthesias[ ] ;Blurred vision [ ] ; Diplopia [ ] ; Vision changes [ ]   Ortho/Skin: Arthritis [y]; Joint pain [y]; Muscle pain [ ] ; Joint swelling [ ] ; Back Pain [ ] ; Rash [ ]   Psych: Depression[ ] ; Anxiety[ ]   Heme: Bleeding problems [ ] ; Clotting disorders [ ] ; Anemia [ ]   Endocrine: Diabetes [ ] ; Thyroid dysfunction[ ]    Past Medical History  Diagnosis Date  . NICM (nonischemic cardiomyopathy) ([place])   . Systolic CHF, chronic ([place])   . PAF (paroxysmal atrial fibrillation) ([place])   . HTN (hypertension)   . Chronic bronchitis   . OSA (obstructive sleep apnea) 08/19/2015    Current Outpatient Prescriptions  Medication Sig Dispense Refill  . apixaban (ELIQUIS) 5 MG TABS tablet Take 1 tablet (5 mg total) by mouth 2 (two) times daily. [place]  tablet 11  . carvedilol (COREG) 25 MG tablet Take 1 tablet (25 mg total) by mouth 2 (two) times daily with a meal. 180 tablet 3  . DULoxetine (CYMBALTA) 20 MG capsule TAKE 1 CAPSULE (20 MG TOTAL) BY MOUTH DAILY. 90 capsule 3  . ibuprofen (ADVIL,MOTRIN) 200 MG tablet Take 200 mg by mouth every 6 (six) hours as needed for mild pain or moderate pain.     Marland Kitchen KLOR-CON M20 20 MEQ tablet TAKE 3 TABLETS BY MOUTH TWICE A DAY 540 tablet 3  . methocarbamol (ROBAXIN) 500 MG tablet Take 500 mg by mouth 2 (two) times daily. Take as directed for fluid rentention    . sacubitril-valsartan (ENTRESTO) 49-51 MG Take 1 tablet by mouth 2 (two) times daily. 60 tablet 6  . spironolactone (ALDACTONE) 25  MG tablet TAKE 1 TABLET (25 MG TOTAL) BY MOUTH DAILY. 90 tablet 2  . torsemide (DEMADEX) 20 MG tablet Take 1 tablet (20 mg total) by mouth daily.    . metolazone (ZAROXOLYN) 5 MG tablet Take 5 mg by mouth daily as needed (fluid?). Reported on 08/28/2015     No current facility-administered medications for this encounter.    No Known Allergies    Social History   Social History  . Marital Status: Married    Spouse Name: N/A  . Number of Children: 3  . Years of Education: N/A   Occupational History  . realtor    Social History Main Topics  . Smoking status: Never Smoker   . Smokeless tobacco: Not on file  . Alcohol Use: Yes     Comment: occasional  . Drug Use: Not on file  . Sexual Activity: Not on file   Other Topics Concern  . Not on file   Social History Narrative      Family History  Problem Relation Age of Onset  . Cancer Mother     breast  . Hypertension    . Heart disease      Filed Vitals:   08/28/15 1210  BP: 130/86  Pulse: 87  Weight: 311 lb 4 oz (141.182 kg)  SpO2: 98%    PHYSICAL EXAM: General:  Well appearing. No respiratory difficulty HEENT: normal Neck: supple. no JVD. Carotids 2+ bilat; no bruits. No thyromegaly or nodule noted.  Cor: PMI nondisplaced. RRR. No rubs, gallops or murmurs. Lungs: clear Abdomen: Obese, soft, nontender, nondistended. No hepatosplenomegaly. No bruits or masses. Good bowel sounds. Extremities: no cyanosis, clubbing, rash, edema Neuro: alert & oriented x 3, cranial nerves grossly intact. moves all 4 extremities w/o difficulty. Affect pleasant.  ECG: 07/09/15 NSR 80, ?LAE, LAD, Prolonged QT 434/500 ms  ASSESSMENT & PLAN:  1. Chronic systolic HF - NICM - Echo 12/04/14 LVEF 30-35%, trivial AI, Mod LAE - NYHA Class II 2. Paroxysmal, Afib 3. HTN 4. 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  6. Severe OSA (64/hr)  He is doing very well. NYHA II. Volume status looks good. Holter reviewed personally -  6.3% PVC. This is indeterminate for causal relationship with cardiomyopathy. I discussed with Dr. Caryl Comes. Will stop Ranexa 550 bid and try to suppress. Hopefully PVC burden will improve with treatment of OSA as well. Continue carvedilol 25 bid and spiro 25 daily. Increase Entresto to 97/103 bid. Continue Eliquis. Repeat Echo in 3 months to assess need for ICD. Marland Kitchen   Bensimhon, Daniel,MD 12:34 PM

## 2015-08-28 NOTE — Addendum Note (Signed)
Encounter addended by: Effie Berkshire, RN on: 08/28/2015 12:55 PM<BR>     Documentation filed: Medications, Orders, Patient Instructions Section

## 2015-08-31 DIAGNOSIS — G4733 Obstructive sleep apnea (adult) (pediatric): Secondary | ICD-10-CM | POA: Diagnosis not present

## 2015-09-30 DIAGNOSIS — G4733 Obstructive sleep apnea (adult) (pediatric): Secondary | ICD-10-CM | POA: Diagnosis not present

## 2015-10-16 ENCOUNTER — Encounter: Payer: Self-pay | Admitting: Cardiology

## 2015-10-19 ENCOUNTER — Telehealth (HOSPITAL_COMMUNITY): Payer: Self-pay | Admitting: *Deleted

## 2015-10-19 NOTE — Telephone Encounter (Signed)
Pt requests a call about some supplements he wants to start. He needs to know how they will interact with his other medications. Please call patient and advise.

## 2015-10-22 NOTE — Telephone Encounter (Signed)
Left VM to call back.   [name]. [name], PharmD, BCPS, CPP Clinical Pharmacist Pager: [phone] Phone: [phone] 10/22/2015 10:57 AM

## 2015-10-23 NOTE — Telephone Encounter (Signed)
Harold Park has emailed me a list of the ingredients in a new "energy" supplement called Thrive to review for potential adverse effects and interactions. This product contains high doses of vitamin B12 which has been associated with blood clotting issues and vitamin E which has been associated with bleeding issues which is concerning since he is on Eliquis with his history of afib. It also contains high doses of caffeine and caffeine-containing ingredients which is concerning with his history of afib and PVC's. I have recommended he not use this product at this time.   [name]. [name], PharmD, BCPS, CPP Clinical Pharmacist Pager: [phone] Phone: [phone] 10/23/2015 3:56 PM

## 2015-10-30 ENCOUNTER — Other Ambulatory Visit (HOSPITAL_COMMUNITY): Payer: Self-pay

## 2015-10-30 DIAGNOSIS — I5022 Chronic systolic (congestive) heart failure: Secondary | ICD-10-CM

## 2015-10-31 ENCOUNTER — Ambulatory Visit (HOSPITAL_COMMUNITY)
Admission: RE | Admit: 2015-10-31 | Discharge: 2015-10-31 | Disposition: A | Discharge status: Home / Self Care | Payer: BLUE CROSS/BLUE SHIELD | Source: Ambulatory Visit | Attending: Internal Medicine | Admitting: Internal Medicine

## 2015-10-31 ENCOUNTER — Encounter (HOSPITAL_COMMUNITY): Payer: Self-pay | Admitting: Internal Medicine

## 2015-10-31 VITALS — BP 144/100 | HR 78 | Wt 316.0 lb

## 2015-10-31 DIAGNOSIS — I428 Other cardiomyopathies: Secondary | ICD-10-CM | POA: Insufficient documentation

## 2015-10-31 DIAGNOSIS — Z7901 Long term (current) use of anticoagulants: Secondary | ICD-10-CM | POA: Insufficient documentation

## 2015-10-31 DIAGNOSIS — I493 Ventricular premature depolarization: Secondary | ICD-10-CM | POA: Insufficient documentation

## 2015-10-31 DIAGNOSIS — I272 Other secondary pulmonary hypertension: Secondary | ICD-10-CM | POA: Insufficient documentation

## 2015-10-31 DIAGNOSIS — Z6837 Body mass index (BMI) 37.0-37.9, adult: Secondary | ICD-10-CM | POA: Insufficient documentation

## 2015-10-31 DIAGNOSIS — I480 Paroxysmal atrial fibrillation: Secondary | ICD-10-CM | POA: Insufficient documentation

## 2015-10-31 DIAGNOSIS — I509 Heart failure, unspecified: Secondary | ICD-10-CM | POA: Diagnosis present

## 2015-10-31 DIAGNOSIS — I110 Hypertensive heart disease with heart failure: Secondary | ICD-10-CM | POA: Diagnosis not present

## 2015-10-31 DIAGNOSIS — Z79899 Other long term (current) drug therapy: Secondary | ICD-10-CM | POA: Insufficient documentation

## 2015-10-31 DIAGNOSIS — I5022 Chronic systolic (congestive) heart failure: Secondary | ICD-10-CM | POA: Diagnosis not present

## 2015-10-31 DIAGNOSIS — G4733 Obstructive sleep apnea (adult) (pediatric): Secondary | ICD-10-CM | POA: Diagnosis not present

## 2015-10-31 NOTE — Progress Notes (Signed)
Patient ID: Harold Park, male   DOB: 03-Jul-1965, 50 y.o.   MRN: [id number]    Advanced Heart Failure Clinic Note   Referring Physician: Josue Hector, MD Primary Care: Eulas Post, MD Primary Cardiologist: Josue Hector, MD  Reason for referral: Class III HF  HPI:  Harold Park is a 50 y.o. male with NICM with EF 99991111, chronic systolic CHF, parox A-Flutter with RVR (01/2010) and HTN. He had a cath in 01/2010 demonstrating normal cors, EF 25-30% and moderate pulmonary HTN. Echo done in 01/2010 demonstrated EF 30-35%, mild MR, mild LAE, mild RVE.  Echo 12/04/14 LVEF 30-35%, trivial AI, Mod LAE  cMRI 09/23/12 Moderate LVE with mild LVH, diffuse hypokinesis EF 34%. No infarct or hyper enhancement. Cardiac MRI 06/08/15 LVEF 20%, Moderate LAE and RAE, otherwise normal. Hyper - enhancement imaging was sub-optimal and nondiagnostic  Last seen in [facility] clinic 06/25/15, noted to be in afib RVR 132. Started on eliquis, coreg increased, and TEE/DCCV scheduled for 06/29/15.   Was in NSR on his arrival by EKG so this was cancelled.   We saw him for the first time in May 2017. At that time we switched losartan to [facility] and referred to Juan Quam and had + sleep study with severe OSA 64/hr. Also saw Dr. Caryl Comes in EP. Holter monitor placed to quantify PVCs. I reviewed personally - 6.3%. Unable to tolerate amio in past due to severe nausea. At last visit we started Ranexa 500 bid to try to suppress PVCs.   Returns for routine f/u. Feels much better. Was walking on TM routinely but now limited by plantar fasciitis. Still walking the dog without problem. Denies palpitations. Quit caffeine recently. Wearing CPAP for 1-2 months. Denies edema, orthopnea or PND   Social: Works in Personal assistant, Lives in [place]. 3 kids, 51 yo girl,65 yo girl, 57 yo boy as of 07/2015.  Past Medical History:  Diagnosis Date  . Chronic bronchitis   . HTN (hypertension)   . NICM (nonischemic cardiomyopathy) ([place])   .  OSA (obstructive sleep apnea) 08/19/2015  . PAF (paroxysmal atrial fibrillation) ([place])   . Systolic CHF, chronic ([place])     Current Outpatient Prescriptions  Medication Sig Dispense Refill  . apixaban (ELIQUIS) 5 MG TABS tablet Take 1 tablet (5 mg total) by mouth 2 (two) times daily. 60 tablet 11  . carvedilol (COREG) 25 MG tablet Take 1 tablet (25 mg total) by mouth 2 (two) times daily with a meal. 180 tablet 3  . DULoxetine (CYMBALTA) 20 MG capsule TAKE 1 CAPSULE (20 MG TOTAL) BY MOUTH DAILY. 90 capsule 3  . ibuprofen (ADVIL,MOTRIN) 200 MG tablet Take 200 mg by mouth every 6 (six) hours as needed for mild pain or moderate pain.     Marland Kitchen KLOR-CON M20 20 MEQ tablet TAKE 3 TABLETS BY MOUTH TWICE A DAY 540 tablet 3  . methocarbamol (ROBAXIN) 500 MG tablet Take 500 mg by mouth 2 (two) times daily. Take as directed for fluid rentention    . metolazone (ZAROXOLYN) 5 MG tablet Take 5 mg by mouth daily as needed (fluid?). Reported on 08/28/2015    . ranolazine (RANEXA) 500 MG 12 hr tablet Take 1 tablet (500 mg total) by mouth 2 (two) times daily. 180 tablet 6  . sacubitril-valsartan (ENTRESTO) 97-103 MG Take 1 tablet by mouth 2 (two) times daily. 180 tablet 6  . spironolactone (ALDACTONE) 25 MG tablet TAKE 1 TABLET (25 MG TOTAL) BY MOUTH DAILY. 90 tablet 2  .  torsemide (DEMADEX) 20 MG tablet Take 1 tablet (20 mg total) by mouth daily.     No current facility-administered medications for this encounter.     No Known Allergies    Social History   Social History  . Marital status: Married    Spouse name: N/A  . Number of children: 3  . Years of education: N/A   Occupational History  . realtor    Social History Main Topics  . Smoking status: Never Smoker  . Smokeless tobacco: Not on file  . Alcohol use Yes     Comment: occasional  . Drug use: Unknown  . Sexual activity: Not on file   Other Topics Concern  . Not on file   Social History Narrative  . No narrative on file      Family  History  Problem Relation Age of Onset  . Cancer Mother     breast  . Hypertension    . Heart disease      Vitals:   10/31/15 1040  BP: (!) 144/100  Pulse: 78  SpO2: 99%  Weight: (!) 316 lb (143.3 kg)    PHYSICAL EXAM: General:  Well appearing. No respiratory difficulty HEENT: normal Neck: supple. no JVD. Carotids 2+ bilat; no bruits. No thyromegaly or nodule noted.  Cor: PMI nondisplaced. RRR. No rubs, gallops or murmurs. Lungs: clear Abdomen: Obese, soft, nontender, nondistended. No hepatosplenomegaly. No bruits or masses. Good bowel sounds. Extremities: no cyanosis, clubbing, rash, edema Neuro: alert & oriented x 3, cranial nerves grossly intact. moves all 4 extremities w/o difficulty. Affect pleasant.  ECG: 07/09/15 NSR 80, ?LAE, LAD, Prolonged QT 434/500 ms  ASSESSMENT & PLAN:  1. Chronic systolic HF - NICM - Echo 12/04/14 LVEF 30-35%, trivial AI, Mod LAE - NYHA Class II 2. Paroxysmal, Afib 3. HTN 4. 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  6. Severe OSA (64/hr) on   He is doing very well. NYHA I-II. Volume status looks good. Holter reviewed personally - 6.3% PVC. Has been on Ranexa to suppress PVCs. On excellent HF meds. Recently started CPAP. Will repeat echo to see where EF is. If EF still low can consider repeating Holter monitor to requantify PVC burden.  Continue Eliquis for PAF. BP high today but says it has been well controlled before. He will watch it for [place].   , ,MD 11:25 AM

## 2015-10-31 NOTE — Addendum Note (Signed)
Encounter addended by: Harvie Junior, CMA on: 10/31/2015 11:40 AM<BR>    Actions taken: Order Entry activity accessed, Diagnosis association updated, Sign clinical note

## 2015-10-31 NOTE — Patient Instructions (Signed)
Your provider requests you have an Echocardiogram in 1 month.  Follow up with Dr.Bensimhon 4 months.

## 2015-11-08 DIAGNOSIS — G4733 Obstructive sleep apnea (adult) (pediatric): Secondary | ICD-10-CM | POA: Diagnosis not present

## 2015-11-27 ENCOUNTER — Encounter: Payer: Self-pay | Admitting: Internal Medicine

## 2015-12-01 DIAGNOSIS — G4733 Obstructive sleep apnea (adult) (pediatric): Secondary | ICD-10-CM | POA: Diagnosis not present

## 2015-12-02 ENCOUNTER — Encounter: Payer: Self-pay | Admitting: Cardiology

## 2015-12-03 ENCOUNTER — Ambulatory Visit (HOSPITAL_COMMUNITY): Admission: RE | Admit: 2015-12-03 | Payer: BLUE CROSS/BLUE SHIELD | Source: Ambulatory Visit

## 2015-12-04 ENCOUNTER — Encounter: Payer: Self-pay | Admitting: Cardiology

## 2015-12-04 ENCOUNTER — Ambulatory Visit (INDEPENDENT_AMBULATORY_CARE_PROVIDER_SITE_OTHER): Payer: BLUE CROSS/BLUE SHIELD | Admitting: Cardiology

## 2015-12-04 VITALS — BP 124/70 | HR 87 | Ht 77.0 in | Wt 315.4 lb

## 2015-12-04 DIAGNOSIS — E66812 Obesity, class 2: Secondary | ICD-10-CM | POA: Insufficient documentation

## 2015-12-04 DIAGNOSIS — I10 Essential (primary) hypertension: Secondary | ICD-10-CM | POA: Diagnosis not present

## 2015-12-04 DIAGNOSIS — E669 Obesity, unspecified: Secondary | ICD-10-CM | POA: Diagnosis not present

## 2015-12-04 DIAGNOSIS — G4733 Obstructive sleep apnea (adult) (pediatric): Secondary | ICD-10-CM | POA: Diagnosis not present

## 2015-12-04 HISTORY — DX: Obesity, unspecified: E66.9

## 2015-12-04 NOTE — Progress Notes (Signed)
Cardiology Office Note    Date:  12/04/2015   [name], DOB 1965/10/19, MRN [id number]  PCP:  Eulas Post, MD  Cardiologist:  Fransico Him, MD   Chief Complaint  Patient presents with  . Sleep Apnea  . Hypertension    History of Present Illness:  Harold Park is a 50 y.o. male with a history of HTN, NICM and PAF who was referred for sleep study due to obesity, CHF and excessive daytime sleepiness.  He underwent PSG showing severe OSA with an AHI of 64/hr and oxygen desaturations as low as 81%.  He underwent CPAP titration to 13cm H2O and now presents for followup.  He is doing well with his device.  He tolerates the nasal pillow mask and feels the pressure is adequate.  Since starting the CPAP he feels a little more rested in the am and has some  improved daytime sleepiness but attributes is sleepiness some to his job and erratic sleep patterns.   He has some problems with mouth dryness or nasal congestion.  He is using the chin strap and heated humidity.  He does not think he is snoring.     Past Medical History:  Diagnosis Date  . Chronic bronchitis   . HTN (hypertension)   . NICM (nonischemic cardiomyopathy) ([place])   . Obesity (BMI 30-39.9) 12/04/2015  . OSA (obstructive sleep apnea) 08/19/2015   Severe with AHI 64/hr now on CPAP at 13cm H2O  . PAF (paroxysmal atrial fibrillation) ([place])   . Systolic CHF, chronic ([place])     Past Surgical History:  Procedure Laterality Date  . APPENDECTOMY    . CARDIAC CATHETERIZATION    . LIPOMA RESECTION     arms  . SPLENECTOMY    . SPLIT NIGHT STUDY  08/12/2015    Current Medications: Outpatient Medications Prior to Visit  Medication Sig Dispense Refill  . apixaban (ELIQUIS) 5 MG TABS tablet Take 1 tablet (5 mg total) by mouth 2 (two) times daily. 60 tablet 11  . carvedilol (COREG) 25 MG tablet Take 1 tablet (25 mg total) by mouth 2 (two) times daily with a meal. 180 tablet 3  . DULoxetine (CYMBALTA) 20 MG capsule  TAKE 1 CAPSULE (20 MG TOTAL) BY MOUTH DAILY. 90 capsule 3  . ibuprofen (ADVIL,MOTRIN) 200 MG tablet Take 200 mg by mouth every 6 (six) hours as needed for mild pain or moderate pain.     Marland Kitchen KLOR-CON M20 20 MEQ tablet TAKE 3 TABLETS BY MOUTH TWICE A DAY 540 tablet 3  . methocarbamol (ROBAXIN) 500 MG tablet Take 500 mg by mouth 2 (two) times daily. Take as directed for fluid rentention    . metolazone (ZAROXOLYN) 5 MG tablet Take 5 mg by mouth daily as needed (fluid?). Reported on 08/28/2015    . ranolazine (RANEXA) 500 MG 12 hr tablet Take 1 tablet (500 mg total) by mouth 2 (two) times daily. 180 tablet 6  . sacubitril-valsartan (ENTRESTO) 97-103 MG Take 1 tablet by mouth 2 (two) times daily. 180 tablet 6  . spironolactone (ALDACTONE) 25 MG tablet TAKE 1 TABLET (25 MG TOTAL) BY MOUTH DAILY. 90 tablet 2  . torsemide (DEMADEX) 20 MG tablet Take 1 tablet (20 mg total) by mouth daily.     No facility-administered medications prior to visit.      Allergies:   Review of patient's allergies indicates no known allergies.   Social History   Social History  . Marital status: Married  Spouse name: N/A  . Number of children: 3  . Years of education: N/A   Occupational History  . realtor    Social History Main Topics  . Smoking status: Never Smoker  . Smokeless tobacco: Never Used  . Alcohol use Yes     Comment: occasional  . Drug use: Unknown  . Sexual activity: Not Asked   Other Topics Concern  . None   Social History Narrative  . None     Family History:  The patient's family history includes Cancer in his mother.   ROS:   Please see the history of present illness.    ROS All other systems reviewed and are negative.  No flowsheet data found.     PHYSICAL EXAM:   VS:  BP 124/70   Pulse 87   Ht 6\' 5"  (1.956 m)   Wt (!) 315 lb 6.4 oz (143.1 kg)   BMI 37.40 kg/m    GEN: Well nourished, well developed, in no acute distress  HEENT: normal  Neck: no JVD, carotid bruits, or  masses Cardiac: RRR; no murmurs, rubs, or gallops,no edema.  Intact distal pulses bilaterally.  Respiratory:  clear to auscultation bilaterally, normal work of breathing GI: soft, nontender, nondistended, + BS MS: no deformity or atrophy  Skin: warm and dry, no rash Neuro:  Alert and Oriented x 3, Strength and sensation are intact Psych: euthymic mood, full affect  Wt Readings from Last 3 Encounters:  12/04/15 (!) 315 lb 6.4 oz (143.1 kg)  10/31/15 (!) 316 lb (143.3 kg)  08/28/15 (!) 311 lb 4 oz (141.2 kg)      Studies/Labs Reviewed:   EKG:  EKG is not ordered today.   Recent Labs: 06/25/2015: Hemoglobin 16.3; Platelets 242 07/20/2015: BUN 16; Creatinine, Ser 0.86; Potassium 4.1; Sodium 140   Lipid Panel    Component Value Date/Time   CHOL  01/24/2010 0448    111        ATP III CLASSIFICATION:  <200     mg/dL   Desirable  200-239  mg/dL   Borderline High  >=240    mg/dL   High          TRIG 120 01/24/2010 0448   HDL 28 (L) 01/24/2010 0448   CHOLHDL 4.0 01/24/2010 0448   VLDL 24 01/24/2010 0448   LDLCALC  01/24/2010 0448    59        Total Cholesterol/HDL:CHD Risk Coronary Heart Disease Risk Table                     Men   Women  1/2 Average Risk   3.4   3.3  Average Risk       5.0   4.4  2 X Average Risk   9.6   7.1  3 X Average Risk  23.4   11.0        Use the calculated Patient Ratio above and the CHD Risk Table to determine the patient's CHD Risk.        ATP III CLASSIFICATION (LDL):  <100     mg/dL   Optimal  100-129  mg/dL   Near or Above                    Optimal  130-159  mg/dL   Borderline  160-189  mg/dL   High  >190     mg/dL   Very High    Additional studies/ records that were  reviewed today include:  CPAP download    ASSESSMENT:    1. OSA (obstructive sleep apnea)   2. Essential hypertension   3. Obesity (BMI 30-39.9)      PLAN:  In order of problems listed above:  OSA - the patient is tolerating PAP therapy well without any  problems. The PAP download was reviewed today and showed an AHI of 0.6/hr on 13 cm H2O with 67`% compliance in using more than 4 hours nightly.  The patient has been using and benefiting from CPAP use and will continue to benefit from therapy.  2.  HTN - BP controlled on current meds. Continue BB/entresto/aldactone. 3.  Obesity - I have encouraged him to get into a routine exercise program and cut back on carbs and portions.     Medication Adjustments/Labs and Tests Ordered: Current medicines are reviewed at length with the patient today.  Concerns regarding medicines are outlined above.  Medication changes, Labs and Tests ordered today are listed in the Patient Instructions below.  There are no Patient Instructions on file for this visit.   Signed, Fransico Him, MD  12/04/2015 8:55 AM    [address], [place], [place]  [address] Phone: [phone]; Fax: [phone]

## 2015-12-04 NOTE — Patient Instructions (Signed)

## 2015-12-06 DIAGNOSIS — M722 Plantar fascial fibromatosis: Secondary | ICD-10-CM | POA: Diagnosis not present

## 2015-12-11 ENCOUNTER — Ambulatory Visit (HOSPITAL_COMMUNITY)
Admission: RE | Admit: 2015-12-11 | Discharge: 2015-12-11 | Disposition: A | Discharge status: Home / Self Care | Payer: BLUE CROSS/BLUE SHIELD | Source: Ambulatory Visit | Attending: Internal Medicine | Admitting: Internal Medicine

## 2015-12-11 DIAGNOSIS — J42 Unspecified chronic bronchitis: Secondary | ICD-10-CM | POA: Diagnosis not present

## 2015-12-11 DIAGNOSIS — I4891 Unspecified atrial fibrillation: Secondary | ICD-10-CM | POA: Insufficient documentation

## 2015-12-11 DIAGNOSIS — I509 Heart failure, unspecified: Secondary | ICD-10-CM | POA: Diagnosis present

## 2015-12-11 DIAGNOSIS — G4733 Obstructive sleep apnea (adult) (pediatric): Secondary | ICD-10-CM | POA: Diagnosis not present

## 2015-12-11 DIAGNOSIS — E669 Obesity, unspecified: Secondary | ICD-10-CM | POA: Insufficient documentation

## 2015-12-11 DIAGNOSIS — I110 Hypertensive heart disease with heart failure: Secondary | ICD-10-CM | POA: Diagnosis not present

## 2015-12-11 DIAGNOSIS — I5022 Chronic systolic (congestive) heart failure: Secondary | ICD-10-CM | POA: Diagnosis not present

## 2015-12-11 DIAGNOSIS — Z6837 Body mass index (BMI) 37.0-37.9, adult: Secondary | ICD-10-CM | POA: Insufficient documentation

## 2015-12-11 DIAGNOSIS — I428 Other cardiomyopathies: Secondary | ICD-10-CM | POA: Diagnosis not present

## 2015-12-11 NOTE — Progress Notes (Signed)
  Echocardiogram 2D Echocardiogram has been performed.  ,  12/11/2015, 10:12 AM

## 2015-12-18 DIAGNOSIS — M722 Plantar fascial fibromatosis: Secondary | ICD-10-CM | POA: Diagnosis not present

## 2015-12-21 DIAGNOSIS — M722 Plantar fascial fibromatosis: Secondary | ICD-10-CM | POA: Diagnosis not present

## 2015-12-25 DIAGNOSIS — M722 Plantar fascial fibromatosis: Secondary | ICD-10-CM | POA: Diagnosis not present

## 2015-12-26 ENCOUNTER — Encounter: Payer: Self-pay | Admitting: Adult Health

## 2015-12-26 ENCOUNTER — Ambulatory Visit (INDEPENDENT_AMBULATORY_CARE_PROVIDER_SITE_OTHER): Payer: BLUE CROSS/BLUE SHIELD | Admitting: Adult Health

## 2015-12-26 VITALS — BP 160/70 | Temp 98.1°F | Ht 77.0 in | Wt 313.1 lb

## 2015-12-26 DIAGNOSIS — J069 Acute upper respiratory infection, unspecified: Secondary | ICD-10-CM

## 2015-12-26 MED ORDER — HYDROCODONE-HOMATROPINE 5-1.5 MG/5ML PO SYRP: 5.0000 mL | ORAL_SOLUTION | Freq: Four times a day (QID) | ORAL | 0 refills | Status: DC | PRN

## 2015-12-26 MED ORDER — PREDNISONE 10 MG PO TABS: ORAL_TABLET | ORAL | 0 refills | Status: DC

## 2015-12-26 NOTE — Progress Notes (Signed)
Subjective:    Patient ID: Harold Park, male    DOB: [birth year], 50 y.o.   MRN: [id number]  URI   This is a new problem. The current episode started in the past 7 days (3 days ). The problem has been gradually worsening. There has been no fever. Associated symptoms include congestion, coughing (dry cough ) and rhinorrhea. Pertinent negatives include no chest pain, dysuria, ear pain, nausea, plugged ear sensation, rash, sinus pain, sneezing, sore throat or wheezing. He has tried NSAIDs (motrin ) for the symptoms. The treatment provided no relief.    Cough is interfering with sleep   Review of Systems  HENT: Positive for congestion and rhinorrhea. Negative for ear pain, sinus pressure, sneezing, sore throat and tinnitus.   Respiratory: Positive for cough (dry cough ). Negative for shortness of breath and wheezing.   Cardiovascular: Negative for chest pain.  Gastrointestinal: Negative for nausea.  Genitourinary: Negative for dysuria.  Skin: Negative for rash.  Neurological: Negative.    Past Medical History:  Diagnosis Date  . Chronic bronchitis   . HTN (hypertension)   . NICM (nonischemic cardiomyopathy) ([place])   . Obesity (BMI 30-39.9) 12/04/2015  . OSA (obstructive sleep apnea) 08/19/2015   Severe with AHI 64/hr now on CPAP at 13cm H2O  . PAF (paroxysmal atrial fibrillation) ([place])   . Systolic CHF, chronic ([place])     Social History   Social History  . Marital status: Married    Spouse name: N/A  . Number of children: 3  . Years of education: N/A   Occupational History  . realtor    Social History Main Topics  . Smoking status: Never Smoker  . Smokeless tobacco: Never Used  . Alcohol use Yes     Comment: occasional  . Drug use: Unknown  . Sexual activity: Not on file   Other Topics Concern  . Not on file   Social History Narrative  . No narrative on file    Past Surgical History:  Procedure Laterality Date  . APPENDECTOMY    . CARDIAC CATHETERIZATION      . LIPOMA RESECTION     arms  . SPLENECTOMY    . SPLIT NIGHT STUDY  08/12/2015    Family History  Problem Relation Age of Onset  . Cancer Mother     breast  . Hypertension    . Heart disease      No Known Allergies  Current Outpatient Prescriptions on File Prior to Visit  Medication Sig Dispense Refill  . apixaban (ELIQUIS) 5 MG TABS tablet Take 1 tablet (5 mg total) by mouth 2 (two) times daily. 60 tablet 11  . carvedilol (COREG) 25 MG tablet Take 1 tablet (25 mg total) by mouth 2 (two) times daily with a meal. 180 tablet 3  . DULoxetine (CYMBALTA) 20 MG capsule TAKE 1 CAPSULE (20 MG TOTAL) BY MOUTH DAILY. 90 capsule 3  . ibuprofen (ADVIL,MOTRIN) 200 MG tablet Take 200 mg by mouth every 6 (six) hours as needed for mild pain or moderate pain.     Marland Kitchen KLOR-CON M20 20 MEQ tablet TAKE 3 TABLETS BY MOUTH TWICE A DAY 540 tablet 3  . methocarbamol (ROBAXIN) 500 MG tablet Take 500 mg by mouth 2 (two) times daily. Take as directed for fluid rentention    . metolazone (ZAROXOLYN) 5 MG tablet Take 5 mg by mouth daily as needed (fluid?). Reported on 08/28/2015    . ranolazine (RANEXA) 500 MG 12 hr tablet Take  1 tablet (500 mg total) by mouth 2 (two) times daily. 180 tablet 6  . sacubitril-valsartan (ENTRESTO) 97-103 MG Take 1 tablet by mouth 2 (two) times daily. 180 tablet 6  . spironolactone (ALDACTONE) 25 MG tablet TAKE 1 TABLET (25 MG TOTAL) BY MOUTH DAILY. 90 tablet 2  . torsemide (DEMADEX) 20 MG tablet Take 1 tablet (20 mg total) by mouth daily.     No current facility-administered medications on file prior to visit.     BP (!) 160/70   Temp 98.1 F (36.7 C) (Oral)   Ht 6\' 5"  (1.956 m)   Wt (!) 313 lb 1.6 oz (142 kg)   BMI 37.13 kg/m       Objective:   Physical Exam  Constitutional: He is oriented to person, place, and time. He appears well-developed and well-nourished. No distress.  HENT:  Head: Normocephalic and atraumatic.  Right Ear: External ear normal.  Left Ear: External  ear normal.  Nose: Nose normal.  Mouth/Throat: Oropharynx is clear and moist. No oropharyngeal exudate.  Eyes: Conjunctivae and EOM are normal. Pupils are equal, round, and reactive to light. Right eye exhibits no discharge. Left eye exhibits no discharge. No scleral icterus.  Neck: Normal range of motion. Neck supple. No thyromegaly present.  Cardiovascular: Normal rate, regular rhythm, normal heart sounds and intact distal pulses.  Exam reveals no gallop and no friction rub.   No murmur heard. Pulmonary/Chest: Effort normal and breath sounds normal. No respiratory distress. He has no wheezes. He has no rales. He exhibits no tenderness.  Lymphadenopathy:    He has no cervical adenopathy.  Neurological: He is alert and oriented to person, place, and time.  Skin: Skin is warm and dry. No rash noted. He is not diaphoretic. No erythema. No pallor.  Psychiatric: He has a normal mood and affect. His behavior is normal. Judgment and thought content normal.  Nursing note and vitals reviewed.      Assessment & Plan:  1. Acute upper respiratory infection - Likely viral  - HYDROcodone-homatropine (HYCODAN) 5-1.5 MG/5ML syrup; Take 5 mLs by mouth every 6 (six) hours as needed for cough.  Dispense: 120 mL; Refill: 0 - predniSONE (DELTASONE) 10 MG tablet; 40 mg x 3 days , 20 mg x 3 days, 10 mg x 3 days  Dispense: 21 tablet; Refill: 0 - Can use Mucinex during the day  - Follow up if no improvement in one week or sooner if symptoms get worse  Dorothyann Peng, NP

## 2016-01-01 DIAGNOSIS — M722 Plantar fascial fibromatosis: Secondary | ICD-10-CM | POA: Diagnosis not present

## 2016-01-04 DIAGNOSIS — M722 Plantar fascial fibromatosis: Secondary | ICD-10-CM | POA: Diagnosis not present

## 2016-01-09 ENCOUNTER — Other Ambulatory Visit: Payer: Self-pay | Admitting: Cardiovascular Disease

## 2016-01-09 DIAGNOSIS — M722 Plantar fascial fibromatosis: Secondary | ICD-10-CM | POA: Diagnosis not present

## 2016-01-15 DIAGNOSIS — M722 Plantar fascial fibromatosis: Secondary | ICD-10-CM | POA: Diagnosis not present

## 2016-01-25 DIAGNOSIS — M722 Plantar fascial fibromatosis: Secondary | ICD-10-CM | POA: Diagnosis not present

## 2016-01-29 DIAGNOSIS — M722 Plantar fascial fibromatosis: Secondary | ICD-10-CM | POA: Diagnosis not present

## 2016-02-23 ENCOUNTER — Other Ambulatory Visit: Payer: Self-pay | Admitting: Cardiovascular Disease

## 2016-03-04 ENCOUNTER — Ambulatory Visit (HOSPITAL_COMMUNITY)
Admission: RE | Admit: 2016-03-04 | Discharge: 2016-03-04 | Disposition: A | Discharge status: Home / Self Care | Payer: BLUE CROSS/BLUE SHIELD | Source: Ambulatory Visit | Attending: Internal Medicine | Admitting: Internal Medicine

## 2016-03-04 VITALS — BP 123/79 | HR 72 | Wt 320.0 lb

## 2016-03-04 DIAGNOSIS — G4733 Obstructive sleep apnea (adult) (pediatric): Secondary | ICD-10-CM | POA: Insufficient documentation

## 2016-03-04 DIAGNOSIS — Z79899 Other long term (current) drug therapy: Secondary | ICD-10-CM | POA: Diagnosis not present

## 2016-03-04 DIAGNOSIS — Z7901 Long term (current) use of anticoagulants: Secondary | ICD-10-CM | POA: Insufficient documentation

## 2016-03-04 DIAGNOSIS — I480 Paroxysmal atrial fibrillation: Secondary | ICD-10-CM | POA: Diagnosis not present

## 2016-03-04 DIAGNOSIS — Z6837 Body mass index (BMI) 37.0-37.9, adult: Secondary | ICD-10-CM | POA: Diagnosis not present

## 2016-03-04 DIAGNOSIS — I5022 Chronic systolic (congestive) heart failure: Secondary | ICD-10-CM | POA: Diagnosis not present

## 2016-03-04 DIAGNOSIS — I428 Other cardiomyopathies: Secondary | ICD-10-CM | POA: Diagnosis not present

## 2016-03-04 DIAGNOSIS — I493 Ventricular premature depolarization: Secondary | ICD-10-CM

## 2016-03-04 DIAGNOSIS — I110 Hypertensive heart disease with heart failure: Secondary | ICD-10-CM | POA: Diagnosis not present

## 2016-03-04 DIAGNOSIS — I10 Essential (primary) hypertension: Secondary | ICD-10-CM

## 2016-03-04 MED ORDER — POTASSIUM CHLORIDE CRYS ER 20 MEQ PO TBCR: 60.0000 meq | EXTENDED_RELEASE_TABLET | Freq: Two times a day (BID) | ORAL | 6 refills | Status: DC

## 2016-03-04 NOTE — Progress Notes (Signed)
Patient ID: Harold Park, male   DOB: May 05, 1965, 50 y.o.   MRN: [id number]    Advanced Heart Failure Clinic Note   Referring Physician: Josue Hector, MD Primary Care: Eulas Post, MD Primary Cardiologist: Josue Hector, MD  Reason for referral: Class III HF  HPI:  Harold Park is a 50 y.o. male with NICM with EF 99991111, chronic systolic CHF, parox A-Flutter with RVR (01/2010) and HTN. He had a cath in 01/2010 demonstrating normal cors, EF 25-30% and moderate pulmonary HTN. Echo done in 01/2010 demonstrated EF 30-35%, mild MR, mild LAE, mild RVE.  Echo 12/04/14 LVEF 30-35%, trivial AI, Mod LAE Echo 10/17 EF 35-40%  cMRI 09/23/12 Moderate LVE with mild LVH, diffuse hypokinesis EF 34%. No infarct or hyper enhancement. Cardiac MRI 06/08/15 LVEF 20%, Moderate LAE and RAE, otherwise normal. Hyper - enhancement imaging was sub-optimal and nondiagnostic  Last seen in [facility] clinic 06/25/15, noted to be in afib RVR 132. Started on eliquis, coreg increased, and TEE/DCCV scheduled for 06/29/15.   Was in NSR on his arrival by EKG so this was cancelled.   We saw him for the first time in May 2017. At that time we switched losartan to [facility] and referred to Juan Quam and had + sleep study with severe OSA 64/hr. Also saw Dr. Caryl Comes in EP. Holter monitor placed to quantify PVCs. I reviewed personally - 6.3%. Unable to tolerate amio in past due to severe nausea. Remains on Ranexa 500 bid to try to suppress PVCs.   Returns for routine f/u. Feels good. Has been struggling with plantar fasciitis so not walking as much. Still walking the dog without problem.Denies DOE.  Denies palpitations.  Wearing CPAP. Denies edema, orthopnea or PND   Social: Works in Personal assistant, Lives in [place]. 3 kids, 51 yo girl,54 yo girl, 55 yo boy as of 07/2015.  Past Medical History:  Diagnosis Date  . Chronic bronchitis   . HTN (hypertension)   . NICM (nonischemic cardiomyopathy) ([place])   . Obesity (BMI 30-39.9)  12/04/2015  . OSA (obstructive sleep apnea) 08/19/2015   Severe with AHI 64/hr now on CPAP at 13cm H2O  . PAF (paroxysmal atrial fibrillation) ([place])   . Systolic CHF, chronic ([place])     Current Outpatient Prescriptions  Medication Sig Dispense Refill  . apixaban (ELIQUIS) 5 MG TABS tablet Take 1 tablet (5 mg total) by mouth 2 (two) times daily. 60 tablet 11  . carvedilol (COREG) 25 MG tablet Take 1 tablet (25 mg total) by mouth 2 (two) times daily with a meal. 180 tablet 3  . DULoxetine (CYMBALTA) 20 MG capsule TAKE 1 CAPSULE (20 MG TOTAL) BY MOUTH DAILY. 90 capsule 3  . ibuprofen (ADVIL,MOTRIN) 200 MG tablet Take 200 mg by mouth every 6 (six) hours as needed for mild pain or moderate pain.     Marland Kitchen KLOR-CON M20 20 MEQ tablet TAKE 3 TABLETS BY MOUTH TWICE A DAY 540 tablet 0  . methocarbamol (ROBAXIN) 500 MG tablet Take 500 mg by mouth 2 (two) times daily. Take as directed for fluid rentention    . metolazone (ZAROXOLYN) 5 MG tablet Take 5 mg by mouth daily as needed (fluid?). Reported on 08/28/2015    . ranolazine (RANEXA) 500 MG 12 hr tablet Take 1 tablet (500 mg total) by mouth 2 (two) times daily. 180 tablet 6  . sacubitril-valsartan (ENTRESTO) 97-103 MG Take 1 tablet by mouth 2 (two) times daily. 180 tablet 6  . spironolactone (ALDACTONE)  25 MG tablet TAKE 1 TABLET (25 MG TOTAL) BY MOUTH DAILY. 90 tablet 2  . torsemide (DEMADEX) 20 MG tablet Take 1 tablet (20 mg total) by mouth daily. 90 tablet 1   No current facility-administered medications for this encounter.     No Known Allergies    Social History   Social History  . Marital status: Married    Spouse name: N/A  . Number of children: 3  . Years of education: N/A   Occupational History  . realtor    Social History Main Topics  . Smoking status: Never Smoker  . Smokeless tobacco: Never Used  . Alcohol use Yes     Comment: occasional  . Drug use: Unknown  . Sexual activity: Not on file   Other Topics Concern  . Not on  file   Social History Narrative  . No narrative on file      Family History  Problem Relation Age of Onset  . Cancer Mother     breast  . Hypertension    . Heart disease      Vitals:   03/04/16 1356  BP: 123/79  Pulse: 72  SpO2: 96%  Weight: (!) 320 lb (145.2 kg)    PHYSICAL EXAM: General:  Well appearing. No respiratory difficulty HEENT: normal Neck: supple. no JVD. Carotids 2+ bilat; no bruits. No thyromegaly or nodule noted.  Cor: PMI nondisplaced. RRR. No rubs, gallops or murmurs. Lungs: clear Abdomen: Obese, soft, nontender, nondistended. No hepatosplenomegaly. No bruits or masses. Good bowel sounds. Extremities: no cyanosis, clubbing, rash, edema Neuro: alert & oriented x 3, cranial nerves grossly intact. moves all 4 extremities w/o difficulty. Affect pleasant.  ECG: 07/09/15 NSR 80, ?LAE, LAD, Prolonged QT 434/500 ms  ASSESSMENT & PLAN:  1. Chronic systolic HF - NICM - Echo 12/04/14 LVEF 30-35%, trivial AI, Mod LAE. Echo 10/17 EF 35-40% - NYHA Class II 2. Paroxysmal, Afib 3. HTN 4. 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  6. Severe OSA (64/hr) - now on CPAP  He is doing very well. EF improving. NYHA I-II. Volume status looks good. Holter reviewed previously  - 6.3% PVC. Has been on Ranexa to suppress PVCs. On excellent HF meds. Now using CPAP.  Continue Eliquis for PAF. Will continue current regimen. Will see back in 6 months. At that time can place Holter for 24 hours to remeasure PVC burden. ECG today to assess QT.  , ,MD 2:23 PM

## 2016-03-04 NOTE — Patient Instructions (Signed)
We will contact you in 6 months to schedule your next appointment.  

## 2016-03-04 NOTE — Addendum Note (Signed)
Encounter addended by: Scarlette Calico, RN on: 03/04/2016  2:33 PM<BR>    Actions taken: Visit diagnoses modified, Diagnosis association updated, Sign clinical note, Order list changed

## 2016-04-14 ENCOUNTER — Other Ambulatory Visit: Payer: Self-pay | Admitting: *Deleted

## 2016-04-14 MED ORDER — APIXABAN 5 MG PO TABS: 5.0000 mg | ORAL_TABLET | Freq: Two times a day (BID) | ORAL | 5 refills | Status: DC

## 2016-04-14 NOTE — Telephone Encounter (Signed)
Patient requests a ninety day rx.

## 2016-05-06 ENCOUNTER — Other Ambulatory Visit: Payer: Self-pay | Admitting: Cardiovascular Disease

## 2016-06-02 DIAGNOSIS — H35413 Lattice degeneration of retina, bilateral: Secondary | ICD-10-CM | POA: Diagnosis not present

## 2016-06-02 DIAGNOSIS — H33021 Retinal detachment with multiple breaks, right eye: Secondary | ICD-10-CM | POA: Diagnosis not present

## 2016-06-02 DIAGNOSIS — H33001 Unspecified retinal detachment with retinal break, right eye: Secondary | ICD-10-CM | POA: Diagnosis not present

## 2016-06-02 DIAGNOSIS — H33312 Horseshoe tear of retina without detachment, left eye: Secondary | ICD-10-CM | POA: Diagnosis not present

## 2016-06-03 DIAGNOSIS — H33021 Retinal detachment with multiple breaks, right eye: Secondary | ICD-10-CM | POA: Diagnosis not present

## 2016-06-16 DIAGNOSIS — H43812 Vitreous degeneration, left eye: Secondary | ICD-10-CM | POA: Diagnosis not present

## 2016-06-29 ENCOUNTER — Other Ambulatory Visit: Payer: Self-pay | Admitting: Cardiovascular Disease

## 2016-07-05 ENCOUNTER — Other Ambulatory Visit: Payer: Self-pay | Admitting: Cardiovascular Disease

## 2016-08-27 DIAGNOSIS — G4733 Obstructive sleep apnea (adult) (pediatric): Secondary | ICD-10-CM | POA: Diagnosis not present

## 2016-09-08 ENCOUNTER — Other Ambulatory Visit (HOSPITAL_COMMUNITY): Payer: Self-pay | Admitting: Internal Medicine

## 2016-09-10 ENCOUNTER — Other Ambulatory Visit: Payer: Self-pay | Admitting: Cardiovascular Disease

## 2016-09-11 NOTE — Telephone Encounter (Signed)
Dr. Haroldine Laws pt's.

## 2016-11-17 ENCOUNTER — Other Ambulatory Visit (HOSPITAL_COMMUNITY): Payer: Self-pay | Admitting: Internal Medicine

## 2016-11-21 DIAGNOSIS — H2513 Age-related nuclear cataract, bilateral: Secondary | ICD-10-CM | POA: Diagnosis not present

## 2016-11-21 DIAGNOSIS — H25043 Posterior subcapsular polar age-related cataract, bilateral: Secondary | ICD-10-CM | POA: Diagnosis not present

## 2016-11-21 DIAGNOSIS — H2511 Age-related nuclear cataract, right eye: Secondary | ICD-10-CM | POA: Diagnosis not present

## 2016-11-21 DIAGNOSIS — H02839 Dermatochalasis of unspecified eye, unspecified eyelid: Secondary | ICD-10-CM | POA: Diagnosis not present

## 2016-11-21 DIAGNOSIS — H25013 Cortical age-related cataract, bilateral: Secondary | ICD-10-CM | POA: Diagnosis not present

## 2016-11-21 DIAGNOSIS — H25011 Cortical age-related cataract, right eye: Secondary | ICD-10-CM | POA: Diagnosis not present

## 2016-11-28 ENCOUNTER — Encounter: Payer: Self-pay | Admitting: Adult Health

## 2016-12-02 ENCOUNTER — Other Ambulatory Visit (HOSPITAL_COMMUNITY): Payer: Self-pay | Admitting: Cardiology

## 2016-12-02 MED ORDER — SACUBITRIL-VALSARTAN 97-103 MG PO TABS: 1.0000 | ORAL_TABLET | Freq: Two times a day (BID) | ORAL | 2 refills | Status: DC

## 2016-12-08 DIAGNOSIS — H2513 Age-related nuclear cataract, bilateral: Secondary | ICD-10-CM | POA: Diagnosis not present

## 2016-12-08 DIAGNOSIS — H2511 Age-related nuclear cataract, right eye: Secondary | ICD-10-CM | POA: Diagnosis not present

## 2016-12-08 DIAGNOSIS — Z961 Presence of intraocular lens: Secondary | ICD-10-CM | POA: Diagnosis not present

## 2016-12-09 DIAGNOSIS — H2512 Age-related nuclear cataract, left eye: Secondary | ICD-10-CM | POA: Diagnosis not present

## 2016-12-29 DIAGNOSIS — H2512 Age-related nuclear cataract, left eye: Secondary | ICD-10-CM | POA: Diagnosis not present

## 2017-01-02 ENCOUNTER — Encounter (HOSPITAL_COMMUNITY): Payer: Self-pay | Admitting: Internal Medicine

## 2017-01-02 ENCOUNTER — Telehealth (HOSPITAL_COMMUNITY): Payer: Self-pay | Admitting: Internal Medicine

## 2017-01-02 ENCOUNTER — Ambulatory Visit (HOSPITAL_COMMUNITY)
Admission: RE | Admit: 2017-01-02 | Discharge: 2017-01-02 | Disposition: A | Discharge status: Home / Self Care | Payer: BLUE CROSS/BLUE SHIELD | Source: Ambulatory Visit | Attending: Internal Medicine | Admitting: Internal Medicine

## 2017-01-02 VITALS — BP 132/90 | HR 90 | Wt 328.4 lb

## 2017-01-02 DIAGNOSIS — M722 Plantar fascial fibromatosis: Secondary | ICD-10-CM | POA: Insufficient documentation

## 2017-01-02 DIAGNOSIS — I272 Pulmonary hypertension, unspecified: Secondary | ICD-10-CM | POA: Insufficient documentation

## 2017-01-02 DIAGNOSIS — I429 Cardiomyopathy, unspecified: Secondary | ICD-10-CM | POA: Insufficient documentation

## 2017-01-02 DIAGNOSIS — Z791 Long term (current) use of non-steroidal anti-inflammatories (NSAID): Secondary | ICD-10-CM | POA: Insufficient documentation

## 2017-01-02 DIAGNOSIS — R110 Nausea: Secondary | ICD-10-CM | POA: Diagnosis not present

## 2017-01-02 DIAGNOSIS — I110 Hypertensive heart disease with heart failure: Secondary | ICD-10-CM | POA: Diagnosis not present

## 2017-01-02 DIAGNOSIS — Z7901 Long term (current) use of anticoagulants: Secondary | ICD-10-CM | POA: Insufficient documentation

## 2017-01-02 DIAGNOSIS — J42 Unspecified chronic bronchitis: Secondary | ICD-10-CM | POA: Insufficient documentation

## 2017-01-02 DIAGNOSIS — I480 Paroxysmal atrial fibrillation: Secondary | ICD-10-CM | POA: Insufficient documentation

## 2017-01-02 DIAGNOSIS — I5022 Chronic systolic (congestive) heart failure: Secondary | ICD-10-CM | POA: Insufficient documentation

## 2017-01-02 DIAGNOSIS — I493 Ventricular premature depolarization: Secondary | ICD-10-CM | POA: Insufficient documentation

## 2017-01-02 DIAGNOSIS — G4733 Obstructive sleep apnea (adult) (pediatric): Secondary | ICD-10-CM | POA: Diagnosis not present

## 2017-01-02 DIAGNOSIS — Z79899 Other long term (current) drug therapy: Secondary | ICD-10-CM | POA: Diagnosis not present

## 2017-01-02 DIAGNOSIS — Z803 Family history of malignant neoplasm of breast: Secondary | ICD-10-CM | POA: Insufficient documentation

## 2017-01-02 DIAGNOSIS — Z8249 Family history of ischemic heart disease and other diseases of the circulatory system: Secondary | ICD-10-CM | POA: Insufficient documentation

## 2017-01-02 NOTE — Progress Notes (Signed)
Patient ID: Harold Park, male   DOB: 1965/05/20, 51 y.o.   MRN: [id number]    Advanced Heart Failure Clinic Note   Referring Physician: Josue Hector, MD Primary Care: Harold Post, MD Primary Cardiologist: Harold Hector, MD  Reason for referral: Class III HF  HPI:  Harold Park is a 51 y.o. male with NICM with EF 28-36%, chronic systolic CHF, parox A-Flutter with RVR (01/2010) and HTN. He had a cath in 01/2010 demonstrating normal cors, EF 25-30% and moderate pulmonary HTN. Echo done in 01/2010 demonstrated EF 30-35%, mild MR, mild LAE, mild RVE.  Echo 12/04/14 LVEF 30-35%, trivial AI, Mod LAE Echo 10/17 EF 35-40%  cMRI 09/23/12 Moderate LVE with mild LVH, diffuse hypokinesis EF 34%. No infarct or hyper enhancement. Cardiac MRI 06/08/15 LVEF 20%, Moderate LAE and RAE, otherwise normal. Hyper - enhancement imaging was sub-optimal and nondiagnostic  Last seen in [facility] clinic 06/25/15, noted to be in afib RVR 132. Started on eliquis, coreg increased, and TEE/DCCV scheduled for 06/29/15.   Was in NSR on his arrival by EKG so this was cancelled.   We saw him for the first time in May 2017. At that time we switched losartan to [facility] and referred to Harold Park and had + sleep study with severe OSA 64/hr. Also saw Dr. Caryl Park in EP. Holter monitor placed to quantify PVCs. I reviewed personally - 6.3%. Unable to tolerate amio in past due to severe nausea. Remains on Ranexa 500 bid to try to suppress PVCs.   Returns for routine f/u. Feeling well in March had detached retina. Struggling with eyesight so hasn't gone to gym in 2 months Wearing CPAP. Can do daily activities without too much problem. Can cut up trees as long as he goes slowly. No edema, orthtopnea, PND or palpitations.  Feels good. Has been struggling with plantar fasciitis so not walking as much. Still walking the dog without problem.Denies DOE.  Denies palpitations.  Wearing CPAP. Denies edema, orthopnea or PND   Social: Works  in Personal assistant, Lives in [place]. 3 kids, 73 yo girl,36 yo girl, 2 yo boy as of 07/2015.  Past Medical History:  Diagnosis Date  . Chronic bronchitis   . HTN (hypertension)   . NICM (nonischemic cardiomyopathy) ([place])   . Obesity (BMI 30-39.9) 12/04/2015  . OSA (obstructive sleep apnea) 08/19/2015   Severe with AHI 64/hr now on CPAP at 13cm H2O  . PAF (paroxysmal atrial fibrillation) ([place])   . Systolic CHF, chronic ([place])     Current Outpatient Prescriptions  Medication Sig Dispense Refill  . apixaban (ELIQUIS) 5 MG TABS tablet Take 1 tablet (5 mg total) by mouth 2 (two) times daily. 60 tablet 5  . carvedilol (COREG) 25 MG tablet TAKE 1 TABLET (25 MG TOTAL) BY MOUTH 2 (TWO) TIMES DAILY WITH A MEAL. 180 tablet 3  . DULoxetine (CYMBALTA) 20 MG capsule TAKE 1 CAPSULE (20 MG TOTAL) BY MOUTH DAILY. 90 capsule 3  . ibuprofen (ADVIL,MOTRIN) 200 MG tablet Take 200 mg by mouth every 6 (six) hours as needed for mild pain or moderate pain.     . methocarbamol (ROBAXIN) 500 MG tablet Take 500 mg by mouth 2 (two) times daily. Take as directed for fluid rentention    . metolazone (ZAROXOLYN) 5 MG tablet Take 5 mg by mouth daily as needed (fluid?). Reported on 08/28/2015    . potassium chloride SA (KLOR-CON M20) 20 MEQ tablet Take 3 tablets (60 mEq total) by mouth 2 (  two) times daily. 540 tablet 6  . RANEXA 500 MG 12 hr tablet TAKE 1 TABLET (500 MG TOTAL) BY MOUTH 2 (TWO) TIMES DAILY. 180 tablet 2  . sacubitril-valsartan (ENTRESTO) 97-103 MG Take 1 tablet by mouth 2 (two) times daily. NEEDS OFFICE VISIT 60 tablet 2  . spironolactone (ALDACTONE) 25 MG tablet TAKE 1 TABLET (25 MG TOTAL) BY MOUTH DAILY. 90 tablet 2  . torsemide (DEMADEX) 20 MG tablet TAKE 1 TABLET (20 MG TOTAL) BY MOUTH DAILY. 90 tablet 1   No current facility-administered medications for this encounter.     No Known Allergies    Social History   Social History  . Marital status: Married    Spouse name: N/A  . Number of  children: 3  . Years of education: N/A   Occupational History  . realtor    Social History Main Topics  . Smoking status: Never Smoker  . Smokeless tobacco: Never Used  . Alcohol use Yes     Comment: occasional  . Drug use: Unknown  . Sexual activity: Not on file   Other Topics Concern  . Not on file   Social History Narrative  . No narrative on file      Family History  Problem Relation Age of Onset  . Cancer Mother        breast  . Hypertension Unknown   . Heart disease Unknown     Vitals:   01/02/17 0911  BP: 132/90  Pulse: 90  SpO2: 96%  Weight: (!) 328 lb 6.4 oz (149 kg)     PHYSICAL EXAM: General:  Well appearing. No resp difficulty HEENT: normal Neck: supple. no JVD. Carotids 2+ bilat; no bruits. No lymphadenopathy or thryomegaly appreciated. Cor: PMI nondisplaced. Regular rate & rhythm. No rubs, gallops or murmurs. Lungs: clear Abdomen: soft, nontender, nondistended. No hepatosplenomegaly. No bruits or masses. Good bowel sounds. Extremities: no cyanosis, clubbing, rash, edema Neuro: alert & orientedx3, cranial nerves grossly intact. moves all 4 extremities w/o difficulty. Affect pleasant   ECG: pending  ASSESSMENT & PLAN:  1. Chronic systolic HF - NICM - Echo 12/04/14 LVEF 30-35%, trivial AI, Mod LAE. Echo 10/17 EF 35-40% - Doing well. NYHA I-II. Volume status ok. Continue current meds. Plan repeat echo to reassess - Will repeat Holter to reassess PVC burden. If > 10% and monomorphic will consider ablation  2. Paroxysmal, Afib - Maintaining NSR. Continue apixaban 3. HTN -Blood pressure well controlled. Continue current regimen. 4. 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  - recheck echo  6. Severe OSA (64/hr)  - continue CPAP 7. Frequent PVCs - repeat 48 monitor. Continue Ranexa   , ,MD 9:13 AM

## 2017-01-02 NOTE — Telephone Encounter (Signed)
User: Cherie Dark A Date/time: 01/02/17 9:49 AM  Comment: Called pt and lmsg for him to CB to get scheduled for echo.   Context:  Outcome: Left Message  Phone number: [phone] Phone Type: Home Phone  Comm. type: Telephone Call type: Outgoing  Contact: Aliene Beams Relation to patient: Self

## 2017-01-02 NOTE — Patient Instructions (Signed)
Your physician has recommended that you wear a holter monitor. Holter monitors are medical devices that record the heart's electrical activity. Doctors most often use these monitors to diagnose arrhythmias. Arrhythmias are problems with the speed or rhythm of the heartbeat. The monitor is a small, portable device. You can wear one while you do your normal daily activities. This is usually used to diagnose what is causing palpitations/syncope (passing out).  Your physician has requested that you have an echocardiogram. Echocardiography is a painless test that uses sound waves to create images of your heart. It provides your doctor with information about the size and shape of your heart and how well your heart's chambers and valves are working. This procedure takes approximately one hour. There are no restrictions for this procedure.  We will contact you in 6 months to schedule your next appointment.

## 2017-01-06 DIAGNOSIS — H2513 Age-related nuclear cataract, bilateral: Secondary | ICD-10-CM | POA: Diagnosis not present

## 2017-01-09 ENCOUNTER — Other Ambulatory Visit (HOSPITAL_COMMUNITY): Payer: Self-pay | Admitting: *Deleted

## 2017-01-09 MED ORDER — SACUBITRIL-VALSARTAN 97-103 MG PO TABS
1.0000 | ORAL_TABLET | Freq: Two times a day (BID) | ORAL | 2 refills | Status: DC
Start: 2017-01-09 — End: 2018-01-01

## 2017-01-15 ENCOUNTER — Other Ambulatory Visit: Payer: Self-pay

## 2017-01-15 ENCOUNTER — Ambulatory Visit (INDEPENDENT_AMBULATORY_CARE_PROVIDER_SITE_OTHER): Payer: BLUE CROSS/BLUE SHIELD

## 2017-01-15 ENCOUNTER — Ambulatory Visit (HOSPITAL_COMMUNITY): Payer: BLUE CROSS/BLUE SHIELD | Attending: Cardiology

## 2017-01-15 DIAGNOSIS — I493 Ventricular premature depolarization: Secondary | ICD-10-CM | POA: Diagnosis not present

## 2017-01-15 DIAGNOSIS — I5022 Chronic systolic (congestive) heart failure: Secondary | ICD-10-CM | POA: Insufficient documentation

## 2017-01-15 DIAGNOSIS — G4733 Obstructive sleep apnea (adult) (pediatric): Secondary | ICD-10-CM | POA: Insufficient documentation

## 2017-01-15 DIAGNOSIS — I110 Hypertensive heart disease with heart failure: Secondary | ICD-10-CM | POA: Insufficient documentation

## 2017-01-28 ENCOUNTER — Other Ambulatory Visit: Payer: Self-pay | Admitting: Internal Medicine

## 2017-03-06 ENCOUNTER — Other Ambulatory Visit (HOSPITAL_COMMUNITY): Payer: Self-pay | Admitting: Internal Medicine

## 2017-04-06 ENCOUNTER — Telehealth: Payer: Self-pay | Admitting: Cardiovascular Disease

## 2017-04-06 NOTE — Telephone Encounter (Signed)
Pt's pharmacy [facility] is requesting a refill on Duloxetine 20 mg tablet. Would you like to refill this medication? Dr [name] has refilled this medication in the pass. Please advise

## 2017-04-06 NOTE — Telephone Encounter (Signed)
Called pt's pharmacy per Michaelyn Barter, that pt should request a refill with PCP, Dr. [name] will not refill this medication. Pharmacy verbalized understanding.

## 2017-05-06 ENCOUNTER — Other Ambulatory Visit: Payer: Self-pay | Admitting: Internal Medicine

## 2017-05-29 ENCOUNTER — Ambulatory Visit (INDEPENDENT_AMBULATORY_CARE_PROVIDER_SITE_OTHER): Payer: BLUE CROSS/BLUE SHIELD | Admitting: Adult Health

## 2017-05-29 ENCOUNTER — Encounter: Payer: Self-pay | Admitting: Adult Health

## 2017-05-29 VITALS — BP 150/96 | Temp 97.8°F | Wt 329.0 lb

## 2017-05-29 DIAGNOSIS — Z23 Encounter for immunization: Secondary | ICD-10-CM | POA: Diagnosis not present

## 2017-05-29 DIAGNOSIS — T148XXA Other injury of unspecified body region, initial encounter: Secondary | ICD-10-CM | POA: Diagnosis not present

## 2017-05-29 MED ORDER — DOXYCYCLINE HYCLATE 100 MG PO CAPS: 100.0000 mg | ORAL_CAPSULE | Freq: Two times a day (BID) | ORAL | 0 refills | Status: DC

## 2017-05-29 NOTE — Progress Notes (Signed)
Subjective:    Patient ID: Harold Park, male    DOB: [birth year], 53 y.o.   MRN: [id number]  HPI  52 year old male who  has a past medical history of Chronic bronchitis, HTN (hypertension), NICM (nonischemic cardiomyopathy) ([place]), Obesity (BMI 30-39.9) (12/04/2015), OSA (obstructive sleep apnea) (08/19/2015), PAF (paroxysmal atrial fibrillation) ([place]), and Systolic CHF, chronic ([place]).  He presents to the office today for an acute issue. He reports that he " stepping on a nail yesterday afternoon while working on the deck at home". He reports that the nail went through his boot. Denies any pain but does have numbness at the site of puncture. No bleeding note   Review of Systems See HPI   Past Medical History:  Diagnosis Date  . Chronic bronchitis   . HTN (hypertension)   . NICM (nonischemic cardiomyopathy) ([place])   . Obesity (BMI 30-39.9) 12/04/2015  . OSA (obstructive sleep apnea) 08/19/2015   Severe with AHI 64/hr now on CPAP at 13cm H2O  . PAF (paroxysmal atrial fibrillation) ([place])   . Systolic CHF, chronic ([facility])     Social History   Socioeconomic History  . Marital status: Married    Spouse name: Not on file  . Number of children: 3  . Years of education: Not on file  . Highest education level: Not on file  Occupational History  . Occupation: Cabin crew  Social Needs  . Financial resource strain: Not on file  . Food insecurity:    Worry: Not on file    Inability: Not on file  . Transportation needs:    Medical: Not on file    Non-medical: Not on file  Tobacco Use  . Smoking status: Never Smoker  . Smokeless tobacco: Never Used  Substance and Sexual Activity  . Alcohol use: Yes    Comment: occasional  . Drug use: Not on file  . Sexual activity: Not on file  Lifestyle  . Physical activity:    Days per week: Not on file    Minutes per session: Not on file  . Stress: Not on file  Relationships  . Social connections:    Talks on phone: Not on file    Gets  together: Not on file    Attends religious service: Not on file    Active member of club or organization: Not on file    Attends meetings of clubs or organizations: Not on file    Relationship status: Not on file  . Intimate partner violence:    Fear of current or ex partner: Not on file    Emotionally abused: Not on file    Physically abused: Not on file    Forced sexual activity: Not on file  Other Topics Concern  . Not on file  Social History Narrative  . Not on file    Past Surgical History:  Procedure Laterality Date  . APPENDECTOMY    . CARDIAC CATHETERIZATION    . LIPOMA RESECTION     arms  . SPLENECTOMY    . SPLIT NIGHT STUDY  08/12/2015    Family History  Problem Relation Age of Onset  . Cancer Mother        breast  . Hypertension Unknown   . Heart disease Unknown     No Known Allergies  Current Outpatient Medications on File Prior to Visit  Medication Sig Dispense Refill  . carvedilol (COREG) 25 MG tablet TAKE 1 TABLET (25 MG TOTAL) BY MOUTH 2 (TWO) TIMES DAILY  WITH A MEAL. 180 tablet 3  . DULoxetine (CYMBALTA) 20 MG capsule TAKE 1 CAPSULE (20 MG TOTAL) BY MOUTH DAILY. 90 capsule 3  . ELIQUIS 5 MG TABS tablet TAKE 1 TABLET (5 MG TOTAL) BY MOUTH 2 (TWO) TIMES DAILY. 60 tablet 5  . ibuprofen (ADVIL,MOTRIN) 200 MG tablet Take 200 mg by mouth every 6 (six) hours as needed for mild pain or moderate pain.     Marland Kitchen KLOR-CON M20 20 MEQ tablet TAKE 3 TABLETS (60 MEQ TOTAL) BY MOUTH 2 (TWO) TIMES DAILY. 540 tablet 1  . methocarbamol (ROBAXIN) 500 MG tablet Take 500 mg by mouth 2 (two) times daily. Take as directed for fluid rentention    . metolazone (ZAROXOLYN) 5 MG tablet Take 5 mg by mouth daily as needed (fluid?). Reported on 08/28/2015    . RANEXA 500 MG 12 hr tablet TAKE 1 TABLET (500 MG TOTAL) BY MOUTH 2 (TWO) TIMES DAILY. 180 tablet 2  . sacubitril-valsartan (ENTRESTO) 97-103 MG Take 1 tablet by mouth 2 (two) times daily. 180 tablet 2  . spironolactone (ALDACTONE) 25  MG tablet TAKE 1 TABLET (25 MG TOTAL) BY MOUTH DAILY. 90 tablet 2  . torsemide (DEMADEX) 20 MG tablet TAKE 1 TABLET BY MOUTH EVERY DAY 90 tablet 1   No current facility-administered medications on file prior to visit.     BP (!) 150/96   Temp 97.8 F (36.6 C) (Oral)   Wt (!) 329 lb (149.2 kg)   BMI 39.01 kg/m       Objective:   Physical Exam  Constitutional: He is oriented to person, place, and time. He appears well-developed and well-nourished. No distress.  Cardiovascular: Normal rate, regular rhythm, normal heart sounds and intact distal pulses. Exam reveals no gallop and no friction rub.  No murmur heard. Pulmonary/Chest: Effort normal and breath sounds normal. No respiratory distress. He has no wheezes. He has no rales.  Musculoskeletal:       Feet:  Neurological: He is alert and oriented to person, place, and time.  Skin: Skin is warm and dry. No rash noted. He is not diaphoretic. No erythema. No pallor.  Psychiatric: He has a normal mood and affect. His behavior is normal. Thought content normal.  Nursing note and vitals reviewed.     Assessment & Plan:  1. Puncture wound - Will update tdap and start on 7 day course of Doxycycline for prophylactic measures - doxycycline (VIBRAMYCIN) 100 MG capsule; Take 1 capsule (100 mg total) by mouth 2 (two) times daily.  Dispense: 14 capsule; Refill: 0 - Td : Tetanus/diphtheria >7yo Preservative  free - Return precautions reviewed  Dorothyann Peng, NP

## 2017-06-15 DIAGNOSIS — G4733 Obstructive sleep apnea (adult) (pediatric): Secondary | ICD-10-CM | POA: Diagnosis not present

## 2017-06-17 ENCOUNTER — Ambulatory Visit (HOSPITAL_COMMUNITY)
Admission: RE | Admit: 2017-06-17 | Discharge: 2017-06-17 | Disposition: A | Discharge status: Home / Self Care | Payer: BLUE CROSS/BLUE SHIELD | Source: Ambulatory Visit | Attending: Internal Medicine | Admitting: Internal Medicine

## 2017-06-17 ENCOUNTER — Encounter (HOSPITAL_COMMUNITY): Payer: Self-pay

## 2017-06-17 ENCOUNTER — Telehealth (HOSPITAL_COMMUNITY): Payer: Self-pay | Admitting: *Deleted

## 2017-06-17 ENCOUNTER — Telehealth (HOSPITAL_COMMUNITY): Payer: Self-pay

## 2017-06-17 ENCOUNTER — Other Ambulatory Visit (HOSPITAL_COMMUNITY): Payer: Self-pay | Admitting: *Deleted

## 2017-06-17 ENCOUNTER — Encounter (HOSPITAL_COMMUNITY): Payer: Self-pay | Admitting: *Deleted

## 2017-06-17 VITALS — BP 102/78 | HR 78 | Wt 329.0 lb

## 2017-06-17 DIAGNOSIS — G4733 Obstructive sleep apnea (adult) (pediatric): Secondary | ICD-10-CM | POA: Diagnosis not present

## 2017-06-17 DIAGNOSIS — Z8249 Family history of ischemic heart disease and other diseases of the circulatory system: Secondary | ICD-10-CM | POA: Insufficient documentation

## 2017-06-17 DIAGNOSIS — I4891 Unspecified atrial fibrillation: Secondary | ICD-10-CM | POA: Diagnosis not present

## 2017-06-17 DIAGNOSIS — I493 Ventricular premature depolarization: Secondary | ICD-10-CM | POA: Diagnosis not present

## 2017-06-17 DIAGNOSIS — I480 Paroxysmal atrial fibrillation: Secondary | ICD-10-CM

## 2017-06-17 DIAGNOSIS — Z6839 Body mass index (BMI) 39.0-39.9, adult: Secondary | ICD-10-CM | POA: Insufficient documentation

## 2017-06-17 DIAGNOSIS — I110 Hypertensive heart disease with heart failure: Secondary | ICD-10-CM | POA: Insufficient documentation

## 2017-06-17 DIAGNOSIS — I5022 Chronic systolic (congestive) heart failure: Secondary | ICD-10-CM

## 2017-06-17 DIAGNOSIS — I4892 Unspecified atrial flutter: Secondary | ICD-10-CM | POA: Diagnosis not present

## 2017-06-17 DIAGNOSIS — I429 Cardiomyopathy, unspecified: Secondary | ICD-10-CM | POA: Insufficient documentation

## 2017-06-17 DIAGNOSIS — Z7902 Long term (current) use of antithrombotics/antiplatelets: Secondary | ICD-10-CM | POA: Diagnosis not present

## 2017-06-17 DIAGNOSIS — Z79899 Other long term (current) drug therapy: Secondary | ICD-10-CM | POA: Diagnosis not present

## 2017-06-17 DIAGNOSIS — Z888 Allergy status to other drugs, medicaments and biological substances status: Secondary | ICD-10-CM | POA: Insufficient documentation

## 2017-06-17 DIAGNOSIS — I272 Pulmonary hypertension, unspecified: Secondary | ICD-10-CM | POA: Diagnosis not present

## 2017-06-17 LAB — BASIC METABOLIC PANEL
Anion gap: 8 (ref 5–15)
BUN: 21 mg/dL — AB (ref 6–20)
CHLORIDE: 106 mmol/L (ref 101–111)
CO2: 26 mmol/L (ref 22–32)
Calcium: 9.3 mg/dL (ref 8.9–10.3)
Creatinine, Ser: 1.33 mg/dL — ABNORMAL HIGH (ref 0.61–1.24)
GFR calc Af Amer: >60 mL/min (ref >60)
GFR calc non Af Amer: 60 mL/min — ABNORMAL LOW (ref >60)
GLUCOSE: 109 mg/dL — AB (ref 65–99)
POTASSIUM: 3 mmol/L — AB (ref 3.5–5.1)
Sodium: 140 mmol/L (ref 135–145)

## 2017-06-17 LAB — CBC
HCT: 50.2 % (ref 39.0–52.0)
HEMOGLOBIN: 17.5 g/dL — AB (ref 13.0–17.0)
MCH: 31 pg (ref 26.0–34.0)
MCHC: 34.9 g/dL (ref 30.0–36.0)
MCV: 88.8 fL (ref 78.0–100.0)
Platelets: 239 10*3/uL (ref 150–400)
RBC: 5.65 MIL/uL (ref 4.22–5.81)
RDW: 14.6 % (ref 11.5–15.5)
WBC: 12.5 10*3/uL — AB (ref 4.0–10.5)

## 2017-06-17 LAB — MAGNESIUM: MAGNESIUM: 2 mg/dL (ref 1.7–2.4)

## 2017-06-17 LAB — PROTIME-INR
INR: 1.18
Prothrombin Time: 14.9 seconds (ref 11.4–15.2)

## 2017-06-17 MED ORDER — TORSEMIDE 20 MG PO TABS: 40.0000 mg | ORAL_TABLET | Freq: Every day | ORAL | 3 refills | Status: DC

## 2017-06-17 MED ORDER — POTASSIUM CHLORIDE CRYS ER 20 MEQ PO TBCR: EXTENDED_RELEASE_TABLET | ORAL | 3 refills | Status: DC

## 2017-06-17 NOTE — H&P (View-Only) (Signed)
Patient ID: Harold Park, male   DOB: [birth year], 52 y.o.   MRN: [id number]    Advanced Heart Failure Clinic Note   Primary Care: Harold Post, MD Primary Cardiologist: Harold Hector, MD HF MD: Harold Park  HPI: Harold Park is a 52 y.o. male with NICM with EF 47-42%, chronic systolic CHF, parox A-Flutter with RVR (01/2010) and HTN. He had a cath in 01/2010 demonstrating normal cors, EF 25-30% and moderate pulmonary HTN. Echo done in 01/2010 demonstrated EF 30-35%, mild MR, mild LAE, mild RVE.  Last seen in [facility] clinic 06/25/15, noted to be in afib RVR 132. Started on eliquis, coreg increased, and TEE/DCCV scheduled for 06/29/15.   Was in NSR on his arrival by EKG so this was cancelled.   We saw him for the first time in May 2017. At that time we switched losartan to [facility] and referred to Harold Park and had + sleep study with severe OSA 64/hr. Also saw Harold Park in EP. Holter monitor placed to quantify PVCs---> - 6.3%. Unable to tolerate amio in past due to severe nausea. Remains on Ranexa 500 bid to try to suppress PVCs.   Today he returns for an acute visit for suspected a fib. Overall feeling a little tired. Says he started feeling back on Sunday. He was a little tired and took an extra torsemide and metolazone.Weight at home trending up 220--->230 pounds.  Mild dyspnea with steps. Denies PND/Orthopnea. Appetite ok. No fever or chills. Taking all medications.   ECHO  01/2017 EF 35% Grade IDD RV normal. 12/11/2015 EF 35-40%    12/04/14 LVEF 30-35%, trivial AI, Mod LAE   Social: Works in Personal assistant, Lives in [place]. 3 kids  Past Medical History:  Diagnosis Date  . Chronic bronchitis   . HTN (hypertension)   . NICM (nonischemic cardiomyopathy) ([place])   . Obesity (BMI 30-39.9) 12/04/2015  . OSA (obstructive sleep apnea) 08/19/2015   Severe with AHI 64/hr now on CPAP at 13cm H2O  . PAF (paroxysmal atrial fibrillation) ([place])   . Systolic CHF, chronic ([facility])     Current  Outpatient Medications  Medication Sig Dispense Refill  . carvedilol (COREG) 25 MG tablet TAKE 1 TABLET (25 MG TOTAL) BY MOUTH 2 (TWO) TIMES DAILY WITH A MEAL. 180 tablet 3  . doxycycline (VIBRAMYCIN) 100 MG capsule Take 1 capsule (100 mg total) by mouth 2 (two) times daily. 14 capsule 0  . DULoxetine (CYMBALTA) 20 MG capsule TAKE 1 CAPSULE (20 MG TOTAL) BY MOUTH DAILY. 90 capsule 3  . ELIQUIS 5 MG TABS tablet TAKE 1 TABLET (5 MG TOTAL) BY MOUTH 2 (TWO) TIMES DAILY. 60 tablet 5  . ibuprofen (ADVIL,MOTRIN) 200 MG tablet Take 200 mg by mouth every 6 (six) hours as needed for mild pain or moderate pain.     Marland Kitchen KLOR-CON M20 20 MEQ tablet TAKE 3 TABLETS (60 MEQ TOTAL) BY MOUTH 2 (TWO) TIMES DAILY. 540 tablet 1  . methocarbamol (ROBAXIN) 500 MG tablet Take 500 mg by mouth 2 (two) times daily. Take as directed for fluid rentention    . metolazone (ZAROXOLYN) 5 MG tablet Take 5 mg by mouth daily as needed (fluid?). Reported on 08/28/2015    . RANEXA 500 MG 12 hr tablet TAKE 1 TABLET (500 MG TOTAL) BY MOUTH 2 (TWO) TIMES DAILY. 180 tablet 2  . sacubitril-valsartan (ENTRESTO) 97-103 MG Take 1 tablet by mouth 2 (two) times daily. 180 tablet 2  . spironolactone (ALDACTONE) 25 MG tablet  TAKE 1 TABLET (25 MG TOTAL) BY MOUTH DAILY. 90 tablet 2  . torsemide (DEMADEX) 20 MG tablet Take 2 tablets (40 mg total) by mouth daily. 180 tablet 3   No current facility-administered medications for this encounter.     Allergies  Allergen Reactions  . Amiodarone Nausea Only      Social History   Socioeconomic History  . Marital status: Married    Spouse name: Not on file  . Number of children: 3  . Years of education: Not on file  . Highest education level: Not on file  Occupational History  . Occupation: Cabin crew  Social Needs  . Financial resource strain: Not on file  . Food insecurity:    Worry: Not on file    Inability: Not on file  . Transportation needs:    Medical: Not on file    Non-medical: Not on  file  Tobacco Use  . Smoking status: Never Smoker  . Smokeless tobacco: Never Used  Substance and Sexual Activity  . Alcohol use: Yes    Comment: occasional  . Drug use: Not on file  . Sexual activity: Not on file  Lifestyle  . Physical activity:    Days per week: Not on file    Minutes per session: Not on file  . Stress: Not on file  Relationships  . Social connections:    Talks on phone: Not on file    Gets together: Not on file    Attends religious service: Not on file    Active member of club or organization: Not on file    Attends meetings of clubs or organizations: Not on file    Relationship status: Not on file  . Intimate partner violence:    Fear of current or ex partner: Not on file    Emotionally abused: Not on file    Physically abused: Not on file    Forced sexual activity: Not on file  Other Topics Concern  . Not on file  Social History Narrative  . Not on file      Family History  Problem Relation Age of Onset  . Cancer Mother        breast  . Hypertension Unknown   . Heart disease Unknown     Vitals:   06/17/17 1430 06/17/17 1433  BP:  102/78  Pulse:  78  SpO2:  98%  Weight: (!) 328 lb (148.8 kg) (!) 329 lb (149.2 kg)     PHYSICAL EXAM: General:  Well appearing. No resp difficulty HEENT: normal Neck: supple.JVP 8-9. Carotids 2+ bilat; no bruits. No lymphadenopathy or thryomegaly appreciated. Cor: PMI nondisplaced. Irregular rate & rhythm. No rubs, gallops or murmurs. Lungs: clear Abdomen: soft, nontender, nondistended. No hepatosplenomegaly. No bruits or masses. Good bowel sounds. Extremities: no cyanosis, clubbing, rash, R and LLE 1+ edema Neuro: alert & orientedx3, cranial nerves grossly intact. moves all 4 extremities w/o difficulty. Affect pleasant   EKG: A fib 151 bpm   ASSESSMENT & PLAN:  1. Chronic systolic HF - NICM - ECHO 01/2017 EF 35%. NYHA II-III. Volume status mildly elevated likely in the setting of A fib.  Double  torsemide until cardioversion.  - Continue carvedilol 25 mg twice a day.  - continue entresto 97-103 mg twice a day.  -Continue spiro 25 mg daily 2. PAF - back in a fib today. He has been taking apixaban 5 mg twice a day.  -Set up for cardioversion later this week. Then will refer to  A fib clinic for an ablation.  - Intolerant amiodarone---> nausea/vomiting.  3. HTN Stable.  4. Morbid Obesity  Body mass index is 39.01 kg/m.' - needs to lose some weight.  5. 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  6. Severe OSA (64/hr)  - continue CPAP 7. Frequent PVCs -  Continue Ranexa  Check BMET, CBC today. Set up for DC-CV. Follow up in 2 weeks in the A fib clinic for an ablation. Follow up in 6 weeks with Harold Park.   Greater than 50% of the (total minutes 25) visit spent in counseling/coordination of care regarding EKG, cardioversion, and medications changes.    Darrick Grinder, NP-C  3:36 PM

## 2017-06-17 NOTE — Telephone Encounter (Signed)
Patient calling CHF clinic triage line c/o "feeling out of rhythm". Patient with Hx of PAF, will add on to nurse triage schedule for EKG and follow up with Dr. Haroldine Laws in clinic.  Renee Pain, RN

## 2017-06-17 NOTE — Progress Notes (Signed)
Patient ID: Harold Park, male   DOB: 03/11/1965, 52 y.o.   MRN: [id number]    Advanced Heart Failure Clinic Note   Primary Care: Eulas Post, MD Primary Cardiologist: Josue Hector, MD HF MD: Dr Haroldine Laws  HPI: Harold Park is a 52 y.o. male with NICM with EF 84-69%, chronic systolic CHF, parox A-Flutter with RVR (01/2010) and HTN. He had a cath in 01/2010 demonstrating normal cors, EF 25-30% and moderate pulmonary HTN. Echo done in 01/2010 demonstrated EF 30-35%, mild MR, mild LAE, mild RVE.  Last seen in [facility] clinic 06/25/15, noted to be in afib RVR 132. Started on eliquis, coreg increased, and TEE/DCCV scheduled for 06/29/15.   Was in NSR on his arrival by EKG so this was cancelled.   We saw him for the first time in May 2017. At that time we switched losartan to [facility] and referred to Juan Quam and had + sleep study with severe OSA 64/hr. Also saw Dr. Caryl Comes in EP. Holter monitor placed to quantify PVCs---> - 6.3%. Unable to tolerate amio in past due to severe nausea. Remains on Ranexa 500 bid to try to suppress PVCs.   Today he returns for an acute visit for suspected a fib. Overall feeling a little tired. Says he started feeling back on Sunday. He was a little tired and took an extra torsemide and metolazone.Weight at home trending up 220--->230 pounds.  Mild dyspnea with steps. Denies PND/Orthopnea. Appetite ok. No fever or chills. Taking all medications.   ECHO  01/2017 EF 35% Grade IDD RV normal. 12/11/2015 EF 35-40%    12/04/14 LVEF 30-35%, trivial AI, Mod LAE   Social: Works in Personal assistant, Lives in [place]. 3 kids  Past Medical History:  Diagnosis Date  . Chronic bronchitis   . HTN (hypertension)   . NICM (nonischemic cardiomyopathy) ([place])   . Obesity (BMI 30-39.9) 12/04/2015  . OSA (obstructive sleep apnea) 08/19/2015   Severe with AHI 64/hr now on CPAP at 13cm H2O  . PAF (paroxysmal atrial fibrillation) ([place])   . Systolic CHF, chronic ([facility])     Current  Outpatient Medications  Medication Sig Dispense Refill  . carvedilol (COREG) 25 MG tablet TAKE 1 TABLET (25 MG TOTAL) BY MOUTH 2 (TWO) TIMES DAILY WITH A MEAL. 180 tablet 3  . doxycycline (VIBRAMYCIN) 100 MG capsule Take 1 capsule (100 mg total) by mouth 2 (two) times daily. 14 capsule 0  . DULoxetine (CYMBALTA) 20 MG capsule TAKE 1 CAPSULE (20 MG TOTAL) BY MOUTH DAILY. 90 capsule 3  . ELIQUIS 5 MG TABS tablet TAKE 1 TABLET (5 MG TOTAL) BY MOUTH 2 (TWO) TIMES DAILY. 60 tablet 5  . ibuprofen (ADVIL,MOTRIN) 200 MG tablet Take 200 mg by mouth every 6 (six) hours as needed for mild pain or moderate pain.     Marland Kitchen KLOR-CON M20 20 MEQ tablet TAKE 3 TABLETS (60 MEQ TOTAL) BY MOUTH 2 (TWO) TIMES DAILY. 540 tablet 1  . methocarbamol (ROBAXIN) 500 MG tablet Take 500 mg by mouth 2 (two) times daily. Take as directed for fluid rentention    . metolazone (ZAROXOLYN) 5 MG tablet Take 5 mg by mouth daily as needed (fluid?). Reported on 08/28/2015    . RANEXA 500 MG 12 hr tablet TAKE 1 TABLET (500 MG TOTAL) BY MOUTH 2 (TWO) TIMES DAILY. 180 tablet 2  . sacubitril-valsartan (ENTRESTO) 97-103 MG Take 1 tablet by mouth 2 (two) times daily. 180 tablet 2  . spironolactone (ALDACTONE) 25 MG tablet  TAKE 1 TABLET (25 MG TOTAL) BY MOUTH DAILY. 90 tablet 2  . torsemide (DEMADEX) 20 MG tablet Take 2 tablets (40 mg total) by mouth daily. 180 tablet 3   No current facility-administered medications for this encounter.     Allergies  Allergen Reactions  . Amiodarone Nausea Only      Social History   Socioeconomic History  . Marital status: Married    Spouse name: Not on file  . Number of children: 3  . Years of education: Not on file  . Highest education level: Not on file  Occupational History  . Occupation: Cabin crew  Social Needs  . Financial resource strain: Not on file  . Food insecurity:    Worry: Not on file    Inability: Not on file  . Transportation needs:    Medical: Not on file    Non-medical: Not on  file  Tobacco Use  . Smoking status: Never Smoker  . Smokeless tobacco: Never Used  Substance and Sexual Activity  . Alcohol use: Yes    Comment: occasional  . Drug use: Not on file  . Sexual activity: Not on file  Lifestyle  . Physical activity:    Days per week: Not on file    Minutes per session: Not on file  . Stress: Not on file  Relationships  . Social connections:    Talks on phone: Not on file    Gets together: Not on file    Attends religious service: Not on file    Active member of club or organization: Not on file    Attends meetings of clubs or organizations: Not on file    Relationship status: Not on file  . Intimate partner violence:    Fear of current or ex partner: Not on file    Emotionally abused: Not on file    Physically abused: Not on file    Forced sexual activity: Not on file  Other Topics Concern  . Not on file  Social History Narrative  . Not on file      Family History  Problem Relation Age of Onset  . Cancer Mother        breast  . Hypertension Unknown   . Heart disease Unknown     Vitals:   06/17/17 1430 06/17/17 1433  BP:  102/78  Pulse:  78  SpO2:  98%  Weight: (!) 328 lb (148.8 kg) (!) 329 lb (149.2 kg)     PHYSICAL EXAM: General:  Well appearing. No resp difficulty HEENT: normal Neck: supple.JVP 8-9. Carotids 2+ bilat; no bruits. No lymphadenopathy or thryomegaly appreciated. Cor: PMI nondisplaced. Irregular rate & rhythm. No rubs, gallops or murmurs. Lungs: clear Abdomen: soft, nontender, nondistended. No hepatosplenomegaly. No bruits or masses. Good bowel sounds. Extremities: no cyanosis, clubbing, rash, R and LLE 1+ edema Neuro: alert & orientedx3, cranial nerves grossly intact. moves all 4 extremities w/o difficulty. Affect pleasant   EKG: A fib 151 bpm   ASSESSMENT & PLAN:  1. Chronic systolic HF - NICM - ECHO 01/2017 EF 35%. NYHA II-III. Volume status mildly elevated likely in the setting of A fib.  Double  torsemide until cardioversion.  - Continue carvedilol 25 mg twice a day.  - continue entresto 97-103 mg twice a day.  -Continue spiro 25 mg daily 2. PAF - back in a fib today. He has been taking apixaban 5 mg twice a day.  -Set up for cardioversion later this week. Then will refer to  A fib clinic for an ablation.  - Intolerant amiodarone---> nausea/vomiting.  3. HTN Stable.  4. Morbid Obesity  Body mass index is 39.01 kg/m.' - needs to lose some weight.  5. 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  6. Severe OSA (64/hr)  - continue CPAP 7. Frequent PVCs -  Continue Ranexa  Check BMET, CBC today. Set up for DC-CV. Follow up in 2 weeks in the A fib clinic for an ablation. Follow up in 6 weeks with Dr Haroldine Laws.   Greater than 50% of the (total minutes 25) visit spent in counseling/coordination of care regarding EKG, cardioversion, and medications changes.    Darrick Grinder, NP-C  3:36 PM

## 2017-06-17 NOTE — Patient Instructions (Signed)
Labs today (will call for abnormal results, otherwise no news is good news)  INCREASE Torsemide to 40 mg (2 Tablets) Once Daily  You have been scheduled for a Cardioversion, please see attached instruction sheet.  Follow up with Afib Clinic 2 weeks post cardioversion.  Follow up with Dr. Haroldine Laws in 8 weeks.

## 2017-06-17 NOTE — Telephone Encounter (Signed)
Result Notes for Basic Metabolic Panel (BMET)   Notes recorded by Darron Doom, RN on 06/17/2017 at 4:15 PM EDT Called and spoke with patient, he is agreeable to plan. Medication increased and sent to pharmacy. ------  Notes recorded by Conrad Lebanon, NP on 06/17/2017 at 4:11 PM EDT Potassium low. Continue potassium 60 meq twice a day and add another 7meq of potassium

## 2017-06-19 ENCOUNTER — Encounter (HOSPITAL_COMMUNITY): Payer: Self-pay | Admitting: *Deleted

## 2017-06-19 ENCOUNTER — Ambulatory Visit (HOSPITAL_COMMUNITY): Payer: BLUE CROSS/BLUE SHIELD | Admitting: Anesthesiology

## 2017-06-19 ENCOUNTER — Ambulatory Visit (HOSPITAL_COMMUNITY)
Admission: RE | Admit: 2017-06-19 | Discharge: 2017-06-19 | Disposition: A | Discharge status: Home / Self Care | Payer: BLUE CROSS/BLUE SHIELD | Source: Ambulatory Visit | Attending: Internal Medicine | Admitting: Internal Medicine

## 2017-06-19 ENCOUNTER — Encounter (HOSPITAL_COMMUNITY)
Admission: RE | Disposition: A | Discharge status: Home / Self Care | Payer: Self-pay | Source: Ambulatory Visit | Attending: Internal Medicine

## 2017-06-19 ENCOUNTER — Other Ambulatory Visit: Payer: Self-pay

## 2017-06-19 DIAGNOSIS — Z888 Allergy status to other drugs, medicaments and biological substances status: Secondary | ICD-10-CM | POA: Insufficient documentation

## 2017-06-19 DIAGNOSIS — Z9989 Dependence on other enabling machines and devices: Secondary | ICD-10-CM | POA: Insufficient documentation

## 2017-06-19 DIAGNOSIS — G4733 Obstructive sleep apnea (adult) (pediatric): Secondary | ICD-10-CM | POA: Insufficient documentation

## 2017-06-19 DIAGNOSIS — I480 Paroxysmal atrial fibrillation: Secondary | ICD-10-CM | POA: Insufficient documentation

## 2017-06-19 DIAGNOSIS — I5022 Chronic systolic (congestive) heart failure: Secondary | ICD-10-CM | POA: Insufficient documentation

## 2017-06-19 DIAGNOSIS — I110 Hypertensive heart disease with heart failure: Secondary | ICD-10-CM | POA: Diagnosis not present

## 2017-06-19 DIAGNOSIS — I429 Cardiomyopathy, unspecified: Secondary | ICD-10-CM | POA: Insufficient documentation

## 2017-06-19 DIAGNOSIS — F329 Major depressive disorder, single episode, unspecified: Secondary | ICD-10-CM | POA: Insufficient documentation

## 2017-06-19 DIAGNOSIS — I739 Peripheral vascular disease, unspecified: Secondary | ICD-10-CM | POA: Diagnosis not present

## 2017-06-19 DIAGNOSIS — I272 Pulmonary hypertension, unspecified: Secondary | ICD-10-CM | POA: Diagnosis not present

## 2017-06-19 DIAGNOSIS — Z7901 Long term (current) use of anticoagulants: Secondary | ICD-10-CM | POA: Insufficient documentation

## 2017-06-19 DIAGNOSIS — Z79899 Other long term (current) drug therapy: Secondary | ICD-10-CM | POA: Diagnosis not present

## 2017-06-19 DIAGNOSIS — I4892 Unspecified atrial flutter: Secondary | ICD-10-CM | POA: Insufficient documentation

## 2017-06-19 DIAGNOSIS — Z6838 Body mass index (BMI) 38.0-38.9, adult: Secondary | ICD-10-CM | POA: Diagnosis not present

## 2017-06-19 DIAGNOSIS — I4891 Unspecified atrial fibrillation: Secondary | ICD-10-CM | POA: Diagnosis not present

## 2017-06-19 HISTORY — PX: CARDIOVERSION: SHX1299

## 2017-06-19 LAB — POCT I-STAT 4, (NA,K, GLUC, HGB,HCT)
GLUCOSE: 110 mg/dL — AB (ref 65–99)
HCT: 50 % (ref 39.0–52.0)
HEMOGLOBIN: 17 g/dL (ref 13.0–17.0)
POTASSIUM: 3.9 mmol/L (ref 3.5–5.1)
SODIUM: 142 mmol/L (ref 135–145)

## 2017-06-19 SURGERY — CARDIOVERSION
Anesthesia: General

## 2017-06-19 MED ORDER — PHENYLEPHRINE HCL 10 MG/ML IJ SOLN
INTRAMUSCULAR | Status: DC | PRN
  Administered 2017-06-19: 40 ug via INTRAVENOUS
  Administered 2017-06-19: 80 ug via INTRAVENOUS

## 2017-06-19 MED ORDER — PROPOFOL 10 MG/ML IV BOLUS
INTRAVENOUS | Status: DC | PRN
  Administered 2017-06-19: 100 mg via INTRAVENOUS

## 2017-06-19 MED ORDER — LIDOCAINE 2% (20 MG/ML) 5 ML SYRINGE
INTRAMUSCULAR | Status: DC | PRN
  Administered 2017-06-19: 60 mg via INTRAVENOUS

## 2017-06-19 NOTE — Anesthesia Postprocedure Evaluation (Signed)
Anesthesia Post Note  Patient: Harold Park  Procedure(s) Performed: CARDIOVERSION (N/A )     Patient location during evaluation: PACU Anesthesia Type: General Level of consciousness: awake and alert Pain management: pain level controlled Vital Signs Assessment: post-procedure vital signs reviewed and stable Respiratory status: spontaneous breathing, nonlabored ventilation and respiratory function stable Cardiovascular status: blood pressure returned to baseline and stable Postop Assessment: no apparent nausea or vomiting Anesthetic complications: no    Last Vitals:  Vitals:   06/19/17 1225 06/19/17 1234  BP: (!) 89/64 90/68  Pulse: 77   Resp: (!) 24 16  Temp:    SpO2: 97% 96%    Last Pain:  Vitals:   06/19/17 1234  TempSrc:   PainSc: 0-No pain                 ,W. EDMOND

## 2017-06-19 NOTE — Discharge Instructions (Signed)
Electrical Cardioversion, Care After °This sheet gives you information about how to care for yourself after your procedure. Your health care provider may also give you more specific instructions. If you have problems or questions, contact your health care provider. °What can I expect after the procedure? °After the procedure, it is common to have: °· Some redness on the skin where the shocks were given. ° °Follow these instructions at home: °· Do not drive for 24 hours if you were given a medicine to help you relax (sedative). °· Take over-the-counter and prescription medicines only as told by your health care provider. °· Ask your health care provider how to check your pulse. Check it often. °· Rest for 48 hours after the procedure or as told by your health care provider. °· Avoid or limit your caffeine use as told by your health care provider. °Contact a health care provider if: °· You feel like your heart is beating too quickly or your pulse is not regular. °· You have a serious muscle cramp that does not go away. °Get help right away if: °· You have discomfort in your chest. °· You are dizzy or you feel faint. °· You have trouble breathing or you are short of breath. °· Your speech is slurred. °· You have trouble moving an arm or leg on one side of your body. °· Your fingers or toes turn cold or blue. °This information is not intended to replace advice given to you by your health care provider. Make sure you discuss any questions you have with your health care provider. °Document Released: 12/15/2012 Document Revised: 09/28/2015 Document Reviewed: 08/31/2015 °Elsevier Interactive Patient Education © 2018 [employer] ° °

## 2017-06-19 NOTE — Anesthesia Preprocedure Evaluation (Addendum)
Anesthesia Evaluation  Patient identified by MRN, date of birth, ID band Patient awake    Reviewed: Allergy & Precautions, H&P , NPO status , Patient's Chart, lab work & pertinent test results  Airway Mallampati: III  TM Distance: >3 FB Neck ROM: Full    Dental no notable dental hx. (+) Teeth Intact, Dental Advisory Given   Pulmonary sleep apnea and Continuous Positive Airway Pressure Ventilation ,    Pulmonary exam normal breath sounds clear to auscultation       Cardiovascular hypertension, Pt. on medications and Pt. on home beta blockers + Peripheral Vascular Disease and +CHF  + dysrhythmias Atrial Fibrillation  Rhythm:Irregular Rate:Tachycardia     Neuro/Psych Depression negative neurological ROS     GI/Hepatic negative GI ROS, Neg liver ROS,   Endo/Other  negative endocrine ROS  Renal/GU negative Renal ROS  negative genitourinary   Musculoskeletal   Abdominal   Peds  Hematology negative hematology ROS (+)   Anesthesia Other Findings   Reproductive/Obstetrics negative OB ROS                            Anesthesia Physical Anesthesia Plan  ASA: III  Anesthesia Plan: General   Post-op Pain Management:    Induction: Intravenous  PONV Risk Score and Plan: 2 and Treatment may vary due to age or medical condition  Airway Management Planned: Mask  Additional Equipment:   Intra-op Plan:   Post-operative Plan:   Informed Consent: I have reviewed the patients History and Physical, chart, labs and discussed the procedure including the risks, benefits and alternatives for the proposed anesthesia with the patient or authorized representative who has indicated his/her understanding and acceptance.   Dental advisory given  Plan Discussed with: CRNA  Anesthesia Plan Comments:         Anesthesia Quick Evaluation

## 2017-06-19 NOTE — Anesthesia Procedure Notes (Signed)
Procedure Name: General with mask airway Date/Time: 06/19/2017 12:09 PM Performed by: Lavell Luster, CRNA Pre-anesthesia Checklist: Patient identified, Emergency Drugs available, Suction available, Patient being monitored and Timeout performed Patient Re-evaluated:Patient Re-evaluated prior to induction Oxygen Delivery Method: Ambu bag Preoxygenation: Pre-oxygenation with 100% oxygen Induction Type: IV induction Ventilation: Mask ventilation without difficulty Placement Confirmation: breath sounds checked- equal and bilateral and positive ETCO2 Dental Injury: Teeth and Oropharynx as per pre-operative assessment

## 2017-06-19 NOTE — CV Procedure (Signed)
    DIRECT CURRENT CARDIOVERSION  NAME:  Harold Park   MRN: [id number] DOB:  20-Jun-1965   ADMIT DATE: 06/19/2017   INDICATIONS: Atrial fibrillation    PROCEDURE:   Informed consent was obtained prior to the procedure. The risks, benefits and alternatives for the procedure were discussed and the patient comprehended these risks. Once an appropriate time out was taken, the patient had the defibrillator pads placed in the anterior and posterior position. The patient then underwent sedation by the anesthesia service. Once an appropriate level of sedation was achieved, the patient received a single biphasic, synchronized 200J shock with prompt conversion to sinus rhythm. No apparent complications.  Glori Bickers, MD  12:06 PM

## 2017-06-19 NOTE — Transfer of Care (Signed)
Immediate Anesthesia Transfer of Care Note  Patient: Harold Park  Procedure(s) Performed: CARDIOVERSION (N/A )  Patient Location: PACU and Endoscopy Unit  Anesthesia Type:General  Level of Consciousness: awake, alert  and oriented  Airway & Oxygen Therapy: Patient connected to nasal cannula oxygen  Post-op Assessment: Post -op Vital signs reviewed and stable  Post vital signs: stable  Last Vitals:  Vitals Value Taken Time  BP 106/76 06/19/2017 12:10 PM  Temp 36.6 C 06/19/2017 12:10 PM  Pulse    Resp 27 06/19/2017 12:10 PM  SpO2 97 % 06/19/2017 12:10 PM    Last Pain:  Vitals:   06/19/17 1210  TempSrc: Oral  PainSc: 0-No pain         Complications: No apparent anesthesia complications

## 2017-06-19 NOTE — Interval H&P Note (Signed)
History and Physical Interval Note:  06/19/2017 11:45 AM  Harold Park  has presented today for surgery, with the diagnosis of afib  The various methods of treatment have been discussed with the patient and family. After consideration of risks, benefits and other options for treatment, the patient has consented to  Procedure(s): CARDIOVERSION (N/A) as a surgical intervention .  The patient's history has been reviewed, patient examined, no change in status, stable for surgery.  I have reviewed the patient's chart and labs.  Questions were answered to the patient's satisfaction.     Daniel Bensimhon

## 2017-07-02 ENCOUNTER — Encounter (HOSPITAL_COMMUNITY): Payer: Self-pay | Admitting: Nurse Practitioner

## 2017-07-02 ENCOUNTER — Ambulatory Visit (HOSPITAL_COMMUNITY)
Admission: RE | Admit: 2017-07-02 | Discharge: 2017-07-02 | Disposition: A | Discharge status: Home / Self Care | Payer: BLUE CROSS/BLUE SHIELD | Source: Ambulatory Visit | Attending: Nurse Practitioner | Admitting: Nurse Practitioner

## 2017-07-02 VITALS — BP 172/104 | HR 106 | Ht 77.0 in | Wt 329.0 lb

## 2017-07-02 DIAGNOSIS — Z888 Allergy status to other drugs, medicaments and biological substances status: Secondary | ICD-10-CM | POA: Insufficient documentation

## 2017-07-02 DIAGNOSIS — Z6839 Body mass index (BMI) 39.0-39.9, adult: Secondary | ICD-10-CM | POA: Insufficient documentation

## 2017-07-02 DIAGNOSIS — I110 Hypertensive heart disease with heart failure: Secondary | ICD-10-CM | POA: Insufficient documentation

## 2017-07-02 DIAGNOSIS — Z79899 Other long term (current) drug therapy: Secondary | ICD-10-CM | POA: Diagnosis not present

## 2017-07-02 DIAGNOSIS — G4733 Obstructive sleep apnea (adult) (pediatric): Secondary | ICD-10-CM | POA: Insufficient documentation

## 2017-07-02 DIAGNOSIS — I428 Other cardiomyopathies: Secondary | ICD-10-CM | POA: Insufficient documentation

## 2017-07-02 DIAGNOSIS — Z9081 Acquired absence of spleen: Secondary | ICD-10-CM | POA: Insufficient documentation

## 2017-07-02 DIAGNOSIS — Z7901 Long term (current) use of anticoagulants: Secondary | ICD-10-CM | POA: Diagnosis not present

## 2017-07-02 DIAGNOSIS — I5022 Chronic systolic (congestive) heart failure: Secondary | ICD-10-CM | POA: Insufficient documentation

## 2017-07-02 DIAGNOSIS — Z803 Family history of malignant neoplasm of breast: Secondary | ICD-10-CM | POA: Insufficient documentation

## 2017-07-02 DIAGNOSIS — I480 Paroxysmal atrial fibrillation: Secondary | ICD-10-CM | POA: Insufficient documentation

## 2017-07-03 NOTE — Progress Notes (Signed)
Primary Care Physician: Dorothyann Peng, NP Referring Physician: Dr. Roselie Awkward Philipp is a 52 y.o. male with a h/o EF 75-64%, chronic systolic CHF, parox A-Flutter with RVR (01/2010) and HTN. He had a cath in 01/2010 demonstrating normal cors, EF 25-30% and moderate pulmonary HTN. Echo done in 01/2010 demonstrated EF 30-35%, mild MR, mild LAE, mild RVE.  Last seen in [facility] clinic 06/25/15, noted to be in afib RVR 132. Started on eliquis, coreg increased, and TEE/DCCV scheduled for 06/29/15.   Was in NSR on his arrival by EKG so this was cancelled. + sleep study with severe OSA. Vida Roller in EP for  PVC's.. Unable to tolerate amio in past due to severe nausea. Remains on Ranexa 500 bid to try to suppress PVCs.   Was recently found to back in afib in HF clinic and was set up for cardioversion, 4/12, which was successful. Being seen in afib clinic today to be considered for afib ablation. He continues in SR. Does not drink excessive alcohol, moderate caffeine, no tobacco. Discussed what  determined  a good ablation candidate.Marland Kitchen His weight is an issue, he states that he can drop lbs fairly quickly if he really tries.. Morbid obesity works against the success of an ablation. Also by echo, his  Left atrium is 51 cm, with a volume of of 107 ml, indicating enlarged left atrium. This as well makes ablation harder to be able to sustain long term  SR.  Today, he denies symptoms of palpitations, chest pain, shortness of breath, orthopnea, PND, lower extremity edema, dizziness, presyncope, syncope, or neurologic sequela. The patient is tolerating medications without difficulties and is otherwise without complaint today.   Past Medical History:  Diagnosis Date  . Chronic bronchitis   . HTN (hypertension)   . NICM (nonischemic cardiomyopathy) ([place])   . Obesity (BMI 30-39.9) 12/04/2015  . OSA (obstructive sleep apnea) 08/19/2015   Severe with AHI 64/hr now on CPAP at 13cm H2O  . PAF (paroxysmal atrial  fibrillation) ([place])   . Systolic CHF, chronic ([place])    Past Surgical History:  Procedure Laterality Date  . APPENDECTOMY    . CARDIAC CATHETERIZATION    . CARDIOVERSION N/A 06/19/2017   Procedure: CARDIOVERSION;  Surgeon: Jolaine Artist, MD;  Location: [facility] ENDOSCOPY;  Service: Cardiovascular;  Laterality: N/A;  . LIPOMA RESECTION     arms  . SPLENECTOMY    . SPLIT NIGHT STUDY  08/12/2015    Current Outpatient Medications  Medication Sig Dispense Refill  . carvedilol (COREG) 25 MG tablet TAKE 1 TABLET (25 MG TOTAL) BY MOUTH 2 (TWO) TIMES DAILY WITH A MEAL. 180 tablet 3  . DULoxetine (CYMBALTA) 20 MG capsule TAKE 1 CAPSULE (20 MG TOTAL) BY MOUTH DAILY. 90 capsule 3  . ELIQUIS 5 MG TABS tablet TAKE 1 TABLET (5 MG TOTAL) BY MOUTH 2 (TWO) TIMES DAILY. 60 tablet 5  . ibuprofen (ADVIL,MOTRIN) 200 MG tablet Take 800 mg by mouth every 8 (eight) hours as needed for mild pain or moderate pain.     . potassium chloride SA (KLOR-CON M20) 20 MEQ tablet Take 3 tablets (60 mEq total) by mouth every morning AND 3 tablets (60 mEq total) daily with lunch AND 2 tablets (40 mEq total) at bedtime. 720 tablet 3  . RANEXA 500 MG 12 hr tablet TAKE 1 TABLET (500 MG TOTAL) BY MOUTH 2 (TWO) TIMES DAILY. 180 tablet 2  . sacubitril-valsartan (ENTRESTO) 97-103 MG Take 1 tablet by mouth 2 (  two) times daily. 180 tablet 2  . spironolactone (ALDACTONE) 25 MG tablet TAKE 1 TABLET (25 MG TOTAL) BY MOUTH DAILY. 90 tablet 2  . torsemide (DEMADEX) 20 MG tablet Take 2 tablets (40 mg total) by mouth daily. 180 tablet 3  . methocarbamol (ROBAXIN) 500 MG tablet Take 500 mg by mouth 2 (two) times daily as needed for muscle spasms.     . metolazone (ZAROXOLYN) 5 MG tablet Take 5 mg by mouth daily as needed (fluid?). Reported on 08/28/2015     No current facility-administered medications for this encounter.     Allergies  Allergen Reactions  . Amiodarone Nausea Only    Social History   Socioeconomic History  . Marital  status: Married    Spouse name: Not on file  . Number of children: 3  . Years of education: Not on file  . Highest education level: Not on file  Occupational History  . Occupation: Cabin crew  Social Needs  . Financial resource strain: Not on file  . Food insecurity:    Worry: Not on file    Inability: Not on file  . Transportation needs:    Medical: Not on file    Non-medical: Not on file  Tobacco Use  . Smoking status: Never Smoker  . Smokeless tobacco: Never Used  Substance and Sexual Activity  . Alcohol use: Yes    Comment: occasional  . Drug use: Not on file  . Sexual activity: Not on file  Lifestyle  . Physical activity:    Days per week: Not on file    Minutes per session: Not on file  . Stress: Not on file  Relationships  . Social connections:    Talks on phone: Not on file    Gets together: Not on file    Attends religious service: Not on file    Active member of club or organization: Not on file    Attends meetings of clubs or organizations: Not on file    Relationship status: Not on file  . Intimate partner violence:    Fear of current or ex partner: Not on file    Emotionally abused: Not on file    Physically abused: Not on file    Forced sexual activity: Not on file  Other Topics Concern  . Not on file  Social History Narrative  . Not on file    Family History  Problem Relation Age of Onset  . Cancer Mother        breast  . Hypertension Unknown   . Heart disease Unknown     ROS- All systems are reviewed and negative except as per the HPI above  Physical Exam: Vitals:   07/02/17 1441  BP: (!) 172/104  Pulse: (!) 106  Weight: (!) 329 lb (149.2 kg)  Height: 6\' 5"  (1.956 m)   Wt Readings from Last 3 Encounters:  07/02/17 (!) 329 lb (149.2 kg)  06/19/17 (!) 328 lb (148.8 kg)  06/17/17 (!) 329 lb (149.2 kg)    Labs: Lab Results  Component Value Date   NA 142 06/19/2017   K 3.9 06/19/2017   CL 106 06/17/2017   CO2 26 06/17/2017    GLUCOSE 110 (H) 06/19/2017   BUN 21 (H) 06/17/2017   CREATININE 1.33 (H) 06/17/2017   CALCIUM 9.3 06/17/2017   MG 2.0 06/17/2017   Lab Results  Component Value Date   INR 1.18 06/17/2017   Lab Results  Component Value Date   CHOL  01/24/2010    111        ATP III CLASSIFICATION:  <200     mg/dL   Desirable  200-239  mg/dL   Borderline High  >=240    mg/dL   High          HDL 28 (L) 01/24/2010   LDLCALC  01/24/2010    59        Total Cholesterol/HDL:CHD Risk Coronary Heart Disease Risk Table                     Men   Women  1/2 Average Risk   3.4   3.3  Average Risk       5.0   4.4  2 X Average Risk   9.6   7.1  3 X Average Risk  23.4   11.0        Use the calculated Patient Ratio above and the CHD Risk Table to determine the patient's CHD Risk.        ATP III CLASSIFICATION (LDL):  <100     mg/dL   Optimal  100-129  mg/dL   Near or Above                    Optimal  130-159  mg/dL   Borderline  160-189  mg/dL   High  >190     mg/dL   Very High   TRIG 120 01/24/2010     GEN- The patient is well appearing, alert and oriented x 3 today.   Head- normocephalic, atraumatic Eyes-  Sclera clear, conjunctiva pink Ears- hearing intact Oropharynx- clear Neck- supple, no JVP Lymph- no cervical lymphadenopathy Lungs- Clear to ausculation bilaterally, normal work of breathing Heart- Regular rate and rhythm, no murmurs, rubs or gallops, PMI not laterally displaced GI- soft, NT, ND, + BS Extremities- no clubbing, cyanosis, or edema MS- no significant deformity or atrophy Skin- no rash or lesion Psych- euthymic mood, full affect Neuro- strength and sensation are intact  EKG- Sinus tach at 106 bpm, PR int 186 ms, qrs int 114 ms, qtc 459 ms Echo-Study Conclusions  - Left ventricle: The cavity size was mildly dilated. Wall   thickness was increased in a pattern of mild LVH. The estimated   ejection fraction was 35%. Diffuse hypokinesis. Doppler   parameters are  consistent with abnormal left ventricular   relaxation (grade 1 diastolic dysfunction). - Aortic valve: There was no stenosis. - Aorta: Moderately dilated aortic root. Aortic root dimension: 45   mm (ED). Ascending aortic diameter: 41 mm (S). - Mitral valve: There was trivial regurgitation. - Left atrium: The atrium was mildly dilated.51 mm - Right ventricle: The cavity size was normal. Systolic function   was normal. - Right atrium: The atrium was mildly dilated. %!CM - Pulmonary arteries: No complete TR doppler jet so unable to   estimate PA systolic pressure. - Inferior vena cava: The vessel was normal in size. The   respirophasic diameter changes were in the normal range (= 50%),   consistent with normal central venous pressure.  Impressions:  - Mildly dilated LV with mild LV hypertrophy. EF 35%, diffuse   hypokinesis. Normal RV size and systolic function. No significant   valvular abnormalities. Moderately dilated aortic root.     Assessment and Plan: 1. Paroxysmal afib Recent successful cardioversion Discussed ablation but right now now the best candidate, but if he works on his lifestyle, not ruling it out He would  like to work on his weight and try to get below 300 lbs, which would make ablation more likely to be successful. He will also cut down on some of his caffeine He will continue to use cpap Regular exercise encouraged Continue eliquis 5 mg bid for a chadsvasc score of 2 Previously  intolerant of amiodarone 2/2 vomiting Continue cpap Continue carvedilol 25 mg bid  2. Chronic systolic HF PER AHF clinic  Fluid status stable No change in diuretics  I will make the appointment 3 months out to allow pt to work on lifestyle, he wants to confer with his wife, who is an Therapist, sports, to help him decide if he wants Dr. Rayann Heman or Dr. Gaynelle Cage C. , [facility] [address] [place], [place] [address] [phone]

## 2017-07-24 ENCOUNTER — Other Ambulatory Visit: Payer: Self-pay | Admitting: Internal Medicine

## 2017-07-24 ENCOUNTER — Other Ambulatory Visit: Payer: Self-pay | Admitting: Cardiovascular Disease

## 2017-07-24 ENCOUNTER — Other Ambulatory Visit (HOSPITAL_COMMUNITY): Payer: Self-pay

## 2017-07-27 ENCOUNTER — Other Ambulatory Visit: Payer: Self-pay | Admitting: Cardiovascular Disease

## 2017-08-13 ENCOUNTER — Other Ambulatory Visit: Payer: Self-pay

## 2017-08-13 ENCOUNTER — Encounter (HOSPITAL_COMMUNITY): Payer: Self-pay | Admitting: Internal Medicine

## 2017-08-13 ENCOUNTER — Ambulatory Visit (HOSPITAL_COMMUNITY)
Admission: RE | Admit: 2017-08-13 | Discharge: 2017-08-13 | Disposition: A | Discharge status: Home / Self Care | Payer: BLUE CROSS/BLUE SHIELD | Source: Ambulatory Visit | Attending: Internal Medicine | Admitting: Internal Medicine

## 2017-08-13 VITALS — BP 126/84 | HR 78 | Wt 324.8 lb

## 2017-08-13 DIAGNOSIS — Z6838 Body mass index (BMI) 38.0-38.9, adult: Secondary | ICD-10-CM | POA: Insufficient documentation

## 2017-08-13 DIAGNOSIS — I4892 Unspecified atrial flutter: Secondary | ICD-10-CM | POA: Diagnosis not present

## 2017-08-13 DIAGNOSIS — D751 Secondary polycythemia: Secondary | ICD-10-CM | POA: Diagnosis not present

## 2017-08-13 DIAGNOSIS — I5022 Chronic systolic (congestive) heart failure: Secondary | ICD-10-CM | POA: Diagnosis not present

## 2017-08-13 DIAGNOSIS — Z79899 Other long term (current) drug therapy: Secondary | ICD-10-CM | POA: Insufficient documentation

## 2017-08-13 DIAGNOSIS — I10 Essential (primary) hypertension: Secondary | ICD-10-CM | POA: Diagnosis not present

## 2017-08-13 DIAGNOSIS — I110 Hypertensive heart disease with heart failure: Secondary | ICD-10-CM | POA: Diagnosis not present

## 2017-08-13 DIAGNOSIS — G4733 Obstructive sleep apnea (adult) (pediatric): Secondary | ICD-10-CM | POA: Insufficient documentation

## 2017-08-13 DIAGNOSIS — I493 Ventricular premature depolarization: Secondary | ICD-10-CM | POA: Diagnosis not present

## 2017-08-13 DIAGNOSIS — Z7901 Long term (current) use of anticoagulants: Secondary | ICD-10-CM | POA: Diagnosis not present

## 2017-08-13 DIAGNOSIS — I480 Paroxysmal atrial fibrillation: Secondary | ICD-10-CM | POA: Diagnosis not present

## 2017-08-13 DIAGNOSIS — I428 Other cardiomyopathies: Secondary | ICD-10-CM | POA: Diagnosis not present

## 2017-08-13 DIAGNOSIS — E669 Obesity, unspecified: Secondary | ICD-10-CM

## 2017-08-13 LAB — CBC
HEMATOCRIT: 50.4 % (ref 39.0–52.0)
HEMOGLOBIN: 16.6 g/dL (ref 13.0–17.0)
MCH: 29 pg (ref 26.0–34.0)
MCHC: 32.9 g/dL (ref 30.0–36.0)
MCV: 88.1 fL (ref 78.0–100.0)
Platelets: 254 10*3/uL (ref 150–400)
RBC: 5.72 MIL/uL (ref 4.22–5.81)
RDW: 13.7 % (ref 11.5–15.5)
WBC: 13.1 10*3/uL — AB (ref 4.0–10.5)

## 2017-08-13 LAB — BASIC METABOLIC PANEL
ANION GAP: 9 (ref 5–15)
BUN: 14 mg/dL (ref 6–20)
CO2: 22 mmol/L (ref 22–32)
Calcium: 8.8 mg/dL — ABNORMAL LOW (ref 8.9–10.3)
Chloride: 110 mmol/L (ref 101–111)
Creatinine, Ser: 1.07 mg/dL (ref 0.61–1.24)
GFR calc Af Amer: >60 mL/min (ref >60)
GFR calc non Af Amer: >60 mL/min (ref >60)
GLUCOSE: 92 mg/dL (ref 65–99)
POTASSIUM: 4.4 mmol/L (ref 3.5–5.1)
Sodium: 141 mmol/L (ref 135–145)

## 2017-08-13 NOTE — Patient Instructions (Signed)
Labs today  You have been referred to Dr. Radford Pax for sleep apnea.  Your physician recommends that you schedule a follow-up appointment in: 3 months with echocardiogram.

## 2017-08-13 NOTE — Progress Notes (Signed)
Patient ID: Harold Park, male   DOB: [birth year], 52 y.o.   MRN: [id number]    Advanced Heart Failure Clinic Note   Primary Care: Harold Post, MD Primary Cardiologist: Harold Hector, MD HF MD: Dr Harold Park  HPI: Harold Park is a 52 y.o. male with NICM with EF 66-06%, chronic systolic CHF, parox A-Flutter with RVR (01/2010) and HTN. He had a cath in 01/2010 demonstrating normal cors, EF 25-30% and moderate pulmonary HTN. Echo done in 01/2010 demonstrated EF 30-35%, mild MR, mild LAE, mild RVE.  Last seen in [facility] clinic 06/25/15, noted to be in afib RVR 132. Started on eliquis, coreg increased, and TEE/DCCV scheduled for 06/29/15.   Was in NSR on his arrival by EKG so this was cancelled.   We saw him for the first time in May 2017. At that time we switched losartan to [facility] and referred to Harold Park and had + sleep study with severe OSA 64/hr. Also saw Dr. Caryl Park in EP. Holter monitor placed to quantify PVCs---> - 6.3%. Unable to tolerate amio in past due to severe nausea. Remains on Ranexa 500 bid to try to suppress PVCs.   Underwent DC-CV on 06/19/17 for recurrent AF  Seen in AF Clinic on 07/02/17. Felt not to be ideal candidate for AF ablation at this time but willing to consider with some lifestyle changes/weight loss.   Returns for f/u. Feels good. Putting an addition on his house and doing most of the work himself. Denies dyspnea, orthopnea or PND. No palpitations. Using CPAP regularly. Swelling improved on torsemide 20 bid. No bleeding on Eliquis. Recent hgb 17.0   ECHO  01/2017 EF 35% Grade IDD RV normal. 12/11/2015 EF 35-40%    12/04/14 LVEF 30-35%, trivial AI, Mod LAE   Social: Works in Personal assistant, Lives in [place]. 3 kids  Past Medical History:  Diagnosis Date  . Chronic bronchitis   . HTN (hypertension)   . NICM (nonischemic cardiomyopathy) ([place])   . Obesity (BMI 30-39.9) 12/04/2015  . OSA (obstructive sleep apnea) 08/19/2015   Severe with AHI 64/hr now on  CPAP at 13cm H2O  . PAF (paroxysmal atrial fibrillation) ([place])   . Systolic CHF, chronic ([facility])     Current Outpatient Medications  Medication Sig Dispense Refill  . carvedilol (COREG) 25 MG tablet TAKE 1 TABLET (25 MG TOTAL) BY MOUTH 2 (TWO) TIMES DAILY WITH A MEAL. 180 tablet 3  . DULoxetine (CYMBALTA) 20 MG capsule TAKE 1 CAPSULE (20 MG TOTAL) BY MOUTH DAILY. 90 capsule 3  . ELIQUIS 5 MG TABS tablet TAKE 1 TABLET (5 MG TOTAL) BY MOUTH 2 (TWO) TIMES DAILY. 60 tablet 5  . ibuprofen (ADVIL,MOTRIN) 200 MG tablet Take 800 mg by mouth every 8 (eight) hours as needed for mild pain or moderate pain.     . methocarbamol (ROBAXIN) 500 MG tablet Take 500 mg by mouth 2 (two) times daily as needed for muscle spasms.     . metolazone (ZAROXOLYN) 5 MG tablet Take 5 mg by mouth daily as needed (fluid?). Reported on 08/28/2015    . potassium chloride SA (KLOR-CON M20) 20 MEQ tablet Take 3 tablets (60 mEq total) by mouth every morning AND 3 tablets (60 mEq total) daily with lunch AND 2 tablets (40 mEq total) at bedtime. 720 tablet 3  . RANEXA 500 MG 12 hr tablet TAKE 1 TABLET (500 MG TOTAL) BY MOUTH 2 (TWO) TIMES DAILY. 180 tablet 2  . sacubitril-valsartan (ENTRESTO) 97-103 MG Take 1  tablet by mouth 2 (two) times daily. 180 tablet 2  . spironolactone (ALDACTONE) 25 MG tablet TAKE 1 TABLET (25 MG TOTAL) BY MOUTH DAILY. 90 tablet 2  . torsemide (DEMADEX) 20 MG tablet Take 2 tablets (40 mg total) by mouth daily. (Patient taking differently: Take 40 mg by mouth 2 (two) times daily. ) 180 tablet 3   No current facility-administered medications for this encounter.     Allergies  Allergen Reactions  . Amiodarone Nausea Only      Social History   Socioeconomic History  . Marital status: Married    Spouse name: Not on file  . Number of children: 3  . Years of education: Not on file  . Highest education level: Not on file  Occupational History  . Occupation: Cabin crew  Social Needs  . Financial resource  strain: Not on file  . Food insecurity:    Worry: Not on file    Inability: Not on file  . Transportation needs:    Medical: Not on file    Non-medical: Not on file  Tobacco Use  . Smoking status: Never Smoker  . Smokeless tobacco: Never Used  Substance and Sexual Activity  . Alcohol use: Yes    Comment: occasional  . Drug use: Not on file  . Sexual activity: Not on file  Lifestyle  . Physical activity:    Days per week: Not on file    Minutes per session: Not on file  . Stress: Not on file  Relationships  . Social connections:    Talks on phone: Not on file    Gets together: Not on file    Attends religious service: Not on file    Active member of club or organization: Not on file    Attends meetings of clubs or organizations: Not on file    Relationship status: Not on file  . Intimate partner violence:    Fear of current or ex partner: Not on file    Emotionally abused: Not on file    Physically abused: Not on file    Forced sexual activity: Not on file  Other Topics Concern  . Not on file  Social History Narrative  . Not on file      Family History  Problem Relation Age of Onset  . Cancer Mother        breast  . Hypertension Unknown   . Heart disease Unknown     Vitals:   08/13/17 1452  BP: 126/84  Pulse: 78  SpO2: 97%  Weight: (!) 324 lb 12.8 oz (147.3 kg)     PHYSICAL EXAM: General:  Well appearing. No resp difficulty HEENT: normal Neck: supple. no JVD. Carotids 2+ bilat; no bruits. No lymphadenopathy or thryomegaly appreciated. Cor: PMI nondisplaced. Regular rate & rhythm. No rubs, gallops or murmurs. Lungs: clear Abdomen: obesity soft, nontender, nondistended. No hepatosplenomegaly. No bruits or masses. Good bowel sounds. Extremities: no cyanosis, clubbing, rash, 1+ ankle edema Neuro: alert & orientedx3, cranial nerves grossly intact. moves all 4 extremities w/o difficulty. Affect pleasant  ASSESSMENT & PLAN:  1. Chronic systolic HF - NICM -  ECHO 01/2017 EF 35%. - Stable NYHA II - Volume status improved with increased diuretic regimen  - Continue torsemide 40 bid - Continue carvedilol 25 mg twice a day.  - continue entresto 97-103 mg twice a day.  - Continue spiro 25 mg daily - Due for repeat echo  - Check BMET today 2. PAF - s/p DC-CV 06/19/17 -  has been seen in AF Clinic. Felt that he needs to lose weight prior to considering AF ablation. He is working on weight loss.  - Maintaining NSR currently. - Continue Eliquis - Intolerant amiodarone---> nausea/vomiting.  3. HTN -Blood pressure well controlled. Continue current regimen. 4. Morbid Obesity  Body mass index is 38.52 kg/m.' - working on weight loss with goal weight < 290 by end of year 5. 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  6. Severe OSA (64/hr)  - hgb 17.0. Suspect inadequate CPAP support. Will send to Dr. Radford Pax for CPAP titration  7. Frequent PVCs -  Improved. Continue Ranexa 8. Polycythemia - Suspect not oxygenation well at night. HAs not had CPAP adjusted for 2 years. Will refer back for CPAP titration     Glori Bickers, MD 3:18 PM

## 2017-08-29 ENCOUNTER — Other Ambulatory Visit: Payer: Self-pay | Admitting: Cardiovascular Disease

## 2017-08-31 NOTE — Telephone Encounter (Signed)
Pt's pharmacy is requesting a refill on duloxetine (Cymbalta) 20 mg tablet. Would Dr. [name] like to refill this medication? Please address

## 2017-09-14 ENCOUNTER — Encounter: Payer: Self-pay | Admitting: Cardiology

## 2017-09-15 DIAGNOSIS — M9262 Juvenile osteochondrosis of tarsus, left ankle: Secondary | ICD-10-CM | POA: Diagnosis not present

## 2017-10-24 ENCOUNTER — Other Ambulatory Visit (HOSPITAL_COMMUNITY): Payer: Self-pay | Admitting: Internal Medicine

## 2017-10-29 ENCOUNTER — Ambulatory Visit: Payer: BLUE CROSS/BLUE SHIELD | Admitting: Cardiology

## 2017-11-17 ENCOUNTER — Other Ambulatory Visit (HOSPITAL_COMMUNITY): Payer: Self-pay | Admitting: Internal Medicine

## 2017-11-20 ENCOUNTER — Other Ambulatory Visit: Payer: Self-pay

## 2017-11-20 ENCOUNTER — Ambulatory Visit (HOSPITAL_COMMUNITY)
Admission: RE | Admit: 2017-11-20 | Discharge: 2017-11-20 | Disposition: A | Discharge status: Home / Self Care | Payer: BLUE CROSS/BLUE SHIELD | Source: Ambulatory Visit | Attending: Internal Medicine | Admitting: Internal Medicine

## 2017-11-20 ENCOUNTER — Encounter (HOSPITAL_COMMUNITY): Payer: Self-pay | Admitting: Internal Medicine

## 2017-11-20 ENCOUNTER — Ambulatory Visit (HOSPITAL_BASED_OUTPATIENT_CLINIC_OR_DEPARTMENT_OTHER)
Admission: RE | Admit: 2017-11-20 | Discharge: 2017-11-20 | Disposition: A | Discharge status: Home / Self Care | Payer: BLUE CROSS/BLUE SHIELD | Source: Ambulatory Visit | Attending: Internal Medicine | Admitting: Internal Medicine

## 2017-11-20 VITALS — BP 99/64 | HR 86 | Wt 325.1 lb

## 2017-11-20 DIAGNOSIS — I428 Other cardiomyopathies: Secondary | ICD-10-CM | POA: Diagnosis not present

## 2017-11-20 DIAGNOSIS — G4733 Obstructive sleep apnea (adult) (pediatric): Secondary | ICD-10-CM | POA: Diagnosis not present

## 2017-11-20 DIAGNOSIS — I110 Hypertensive heart disease with heart failure: Secondary | ICD-10-CM | POA: Diagnosis not present

## 2017-11-20 DIAGNOSIS — Z79899 Other long term (current) drug therapy: Secondary | ICD-10-CM | POA: Insufficient documentation

## 2017-11-20 DIAGNOSIS — I5022 Chronic systolic (congestive) heart failure: Secondary | ICD-10-CM

## 2017-11-20 DIAGNOSIS — Z6838 Body mass index (BMI) 38.0-38.9, adult: Secondary | ICD-10-CM | POA: Diagnosis not present

## 2017-11-20 DIAGNOSIS — D751 Secondary polycythemia: Secondary | ICD-10-CM | POA: Insufficient documentation

## 2017-11-20 DIAGNOSIS — I480 Paroxysmal atrial fibrillation: Secondary | ICD-10-CM

## 2017-11-20 DIAGNOSIS — I719 Aortic aneurysm of unspecified site, without rupture: Secondary | ICD-10-CM | POA: Diagnosis not present

## 2017-11-20 DIAGNOSIS — Z7901 Long term (current) use of anticoagulants: Secondary | ICD-10-CM | POA: Insufficient documentation

## 2017-11-20 DIAGNOSIS — I493 Ventricular premature depolarization: Secondary | ICD-10-CM | POA: Diagnosis not present

## 2017-11-20 DIAGNOSIS — I272 Pulmonary hypertension, unspecified: Secondary | ICD-10-CM | POA: Insufficient documentation

## 2017-11-20 DIAGNOSIS — I7121 Aneurysm of the ascending aorta, without rupture: Secondary | ICD-10-CM

## 2017-11-20 LAB — BASIC METABOLIC PANEL
ANION GAP: 13 (ref 5–15)
BUN: 27 mg/dL — AB (ref 6–20)
CHLORIDE: 102 mmol/L (ref 98–111)
CO2: 23 mmol/L (ref 22–32)
Calcium: 9.4 mg/dL (ref 8.9–10.3)
Creatinine, Ser: 1.38 mg/dL — ABNORMAL HIGH (ref 0.61–1.24)
GFR, EST NON AFRICAN AMERICAN: 57 mL/min — AB (ref >60)
Glucose, Bld: 102 mg/dL — ABNORMAL HIGH (ref 70–99)
POTASSIUM: 3.3 mmol/L — AB (ref 3.5–5.1)
SODIUM: 138 mmol/L (ref 135–145)

## 2017-11-20 LAB — CBC
HEMATOCRIT: 51.3 % (ref 39.0–52.0)
HEMOGLOBIN: 17.3 g/dL — AB (ref 13.0–17.0)
MCH: 30.2 pg (ref 26.0–34.0)
MCHC: 33.7 g/dL (ref 30.0–36.0)
MCV: 89.5 fL (ref 78.0–100.0)
Platelets: 296 10*3/uL (ref 150–400)
RBC: 5.73 MIL/uL (ref 4.22–5.81)
RDW: 13.3 % (ref 11.5–15.5)
WBC: 15.2 10*3/uL — ABNORMAL HIGH (ref 4.0–10.5)

## 2017-11-20 NOTE — Patient Instructions (Signed)
Labs today  Non-Cardiac CT scanning, (CAT scanning), is a noninvasive, special x-ray that produces cross-sectional images of the body using x-rays and a computer. CT scans help physicians diagnose and treat medical conditions. For some CT exams, a contrast material is used to enhance visibility in the area of the body being studied. CT scans provide greater clarity and reveal more details than regular x-ray exams.  We will contact you in 6 months to schedule your next appointment.

## 2017-11-20 NOTE — Addendum Note (Signed)
Encounter addended by: Scarlette Calico, RN on: 11/20/2017 12:26 PM  Actions taken: Order list changed, Diagnosis association updated, Sign clinical note

## 2017-11-20 NOTE — Progress Notes (Signed)
  Echocardiogram 2D Echocardiogram has been performed.  Harold Park 11/20/2017, 11:54 AM

## 2017-11-20 NOTE — Progress Notes (Signed)
Patient ID: Harold Park, male   DOB: [birth year], 52 y.o.   MRN: [id number]    Advanced Heart Failure Clinic Note   Primary Care: Eulas Post, MD Primary Cardiologist: Josue Hector, MD HF MD: Dr Haroldine Laws  HPI: Harold Park is a 52 y.o. male with NICM with EF 40-98%, chronic systolic CHF, parox A-Flutter with RVR (01/2010) and HTN. He had a cath in 01/2010 demonstrating normal cors, EF 25-30% and moderate pulmonary HTN. Echo done in 01/2010 demonstrated EF 30-35%, mild MR, mild LAE, mild RVE.  Last seen in [facility] clinic 06/25/15, noted to be in afib RVR 132. Started on eliquis, coreg increased, and TEE/DCCV scheduled for 06/29/15.   Was in NSR on his arrival by EKG so this was cancelled.   We saw him for the first time in May 2017. At that time we switched losartan to [facility] and referred to Juan Quam and had + sleep study with severe OSA 64/hr. Also saw Dr. Caryl Comes in EP. Holter monitor placed to quantify PVCs---> - 6.3%. Unable to tolerate amio in past due to severe nausea. Remains on Ranexa 500 bid to try to suppress PVCs.   Underwent DC-CV on 06/19/17 for recurrent AF  Seen in AF Clinic on 07/02/17. Felt not to be ideal candidate for AF ablation at this time but willing to consider with some lifestyle changes/weight loss.   He presents today for regular follow up. Says he feel pretty good. About 2 weeks ago was feeling bloated and took a metolazone and lost 12 pounds in one night. Has bone spur in foot which is limiting his activity. Denies SOB, orthopnea or PND. No problem with meds.  No bleeding on Eliquis. Recent hgb 16.6. Says he has been compliant with CPAP for 2 years. Follows with Dr. Radford Pax has f/u scheduled with her   Echo today reviewed personally EF 35-40%. RV ok.   ECHO  01/2017 EF 35% Grade IDD RV normal. 12/11/2015 EF 35-40%    12/04/14 LVEF 30-35%, trivial AI, Mod LAE  Social: Works in Personal assistant, Lives in [place]. 3 kids  Past Medical History:  Diagnosis  Date  . Chronic bronchitis   . HTN (hypertension)   . NICM (nonischemic cardiomyopathy) ([place])   . Obesity (BMI 30-39.9) 12/04/2015  . OSA (obstructive sleep apnea) 08/19/2015   Severe with AHI 64/hr now on CPAP at 13cm H2O  . PAF (paroxysmal atrial fibrillation) ([place])   . Systolic CHF, chronic (HCC)     Current Outpatient Medications  Medication Sig Dispense Refill  . carvedilol (COREG) 25 MG tablet TAKE 1 TABLET (25 MG TOTAL) BY MOUTH 2 (TWO) TIMES DAILY WITH A MEAL. 180 tablet 3  . DULoxetine (CYMBALTA) 20 MG capsule TAKE 1 CAPSULE (20 MG TOTAL) BY MOUTH DAILY. 90 capsule 3  . ELIQUIS 5 MG TABS tablet TAKE 1 TABLET (5 MG TOTAL) BY MOUTH 2 (TWO) TIMES DAILY. 60 tablet 5  . ibuprofen (ADVIL,MOTRIN) 200 MG tablet Take 800 mg by mouth every 8 (eight) hours as needed for mild pain or moderate pain.     . methocarbamol (ROBAXIN) 500 MG tablet Take 500 mg by mouth 2 (two) times daily as needed for muscle spasms.     . metolazone (ZAROXOLYN) 5 MG tablet Take 5 mg by mouth daily as needed (fluid?). Reported on 08/28/2015    . potassium chloride SA (KLOR-CON M20) 20 MEQ tablet Take 3 tablets (60 mEq total) by mouth every morning AND 3 tablets (60 mEq total) daily  with lunch AND 2 tablets (40 mEq total) at bedtime. 720 tablet 3  . ranolazine (RANEXA) 500 MG 12 hr tablet TAKE 1 TABLET (500 MG TOTAL) BY MOUTH 2 (TWO) TIMES DAILY. 180 tablet 2  . sacubitril-valsartan (ENTRESTO) 97-103 MG Take 1 tablet by mouth 2 (two) times daily. 180 tablet 2  . spironolactone (ALDACTONE) 25 MG tablet TAKE 1 TABLET (25 MG TOTAL) BY MOUTH DAILY. 90 tablet 2  . torsemide (DEMADEX) 20 MG tablet Take 2 tablets (40 mg total) by mouth daily. (Patient taking differently: Take 40 mg by mouth 2 (two) times daily. ) 180 tablet 3   No current facility-administered medications for this encounter.     Allergies  Allergen Reactions  . Amiodarone Nausea Only    Social History   Socioeconomic History  . Marital status: Married     Spouse name: Not on file  . Number of children: 3  . Years of education: Not on file  . Highest education level: Not on file  Occupational History  . Occupation: Cabin crew  Social Needs  . Financial resource strain: Not on file  . Food insecurity:    Worry: Not on file    Inability: Not on file  . Transportation needs:    Medical: Not on file    Non-medical: Not on file  Tobacco Use  . Smoking status: Never Smoker  . Smokeless tobacco: Never Used  Substance and Sexual Activity  . Alcohol use: Yes    Comment: occasional  . Drug use: Not on file  . Sexual activity: Not on file  Lifestyle  . Physical activity:    Days per week: Not on file    Minutes per session: Not on file  . Stress: Not on file  Relationships  . Social connections:    Talks on phone: Not on file    Gets together: Not on file    Attends religious service: Not on file    Active member of club or organization: Not on file    Attends meetings of clubs or organizations: Not on file    Relationship status: Not on file  . Intimate partner violence:    Fear of current or ex partner: Not on file    Emotionally abused: Not on file    Physically abused: Not on file    Forced sexual activity: Not on file  Other Topics Concern  . Not on file  Social History Narrative  . Not on file    Family History  Problem Relation Age of Onset  . Cancer Mother        breast  . Hypertension Unknown   . Heart disease Unknown    Vitals:   11/20/17 1157  BP: 99/64  Pulse: 86  SpO2: 95%  Weight: (!) 147.5 kg (325 lb 2 oz)    Wt Readings from Last 3 Encounters:  08/13/17 (!) 147.3 kg (324 lb 12.8 oz)  07/02/17 (!) 149.2 kg (329 lb)  06/19/17 (!) 148.8 kg (328 lb)     PHYSICAL EXAM: General:  Well appearing. No resp difficulty HEENT: normal Neck: supple. no JVD. Carotids 2+ bilat; no bruits. No lymphadenopathy or thryomegaly appreciated. Cor: PMI nondisplaced. Regular rate & rhythm. No rubs, gallops or  murmurs. Lungs: clear Abdomen: obese soft, nontender, nondistended. No hepatosplenomegaly. No bruits or masses. Good bowel sounds. Extremities: no cyanosis, clubbing, rash, edema Neuro: alert & orientedx3, cranial nerves grossly intact. moves all 4 extremities w/o difficulty. Affect pleasant   ASSESSMENT &  PLAN:  1. Chronic systolic HF - NICM - ECHO 01/2017 EF 35%. - Echo today reviewed personally. EF appears stable at 35-40%.  - NYHA II - Volume status stable - Continue torsemide 40 bid. Take metolazone as needed - Continue carvedilol 25 mg twice a day.  - Continue entresto 97-103 mg twice a day.  - Continue spiro 25 mg daily - Reinforced fluid restriction to < 2 L daily, sodium restriction to less than 2000 mg daily, and the importance of daily weights.   2. PAF - s/p DC-CV 06/19/17 - has been seen in AF Clinic. Felt that he needs to lose weight prior to considering AF ablation. He is working on weight loss.  - Remains in NSR.  - Continue Eliquis. No bleeding currently  - Intolerant amiodarone---> nausea/vomiting. Remains on Ranexa.  3. HTN - BP soft but ok.  4. Morbid Obesity  - Encouraged weight loss. Working on getting bone spur fixed. 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 4.3 cm on last check.  - Will check CT chest  6. Severe OSA (64/hr)  - Last Hgb 16.6 - Referred back for CPAP titration (see below) 7. Frequent PVCs - Stable - Continue Ranexa 8. Polycythemia - Suspect not oxygenation well at night despite CPAP use.  - Sees Dr. Radford Pax next month and hopefully she can assess adequacy of CPAP  Glori Bickers, MD  12:13 PM

## 2017-11-26 ENCOUNTER — Other Ambulatory Visit: Payer: Self-pay | Admitting: Internal Medicine

## 2017-11-30 ENCOUNTER — Other Ambulatory Visit (HOSPITAL_COMMUNITY): Payer: Self-pay | Admitting: *Deleted

## 2017-11-30 MED ORDER — TORSEMIDE 20 MG PO TABS: 40.0000 mg | ORAL_TABLET | Freq: Two times a day (BID) | ORAL | 3 refills | Status: DC

## 2017-12-02 ENCOUNTER — Telehealth (HOSPITAL_COMMUNITY): Payer: Self-pay | Admitting: Vascular Surgery

## 2017-12-02 NOTE — Telephone Encounter (Signed)
Left pt message giving CT appt 10/7 @ 3pm , asked pt to call back to confirm appt

## 2017-12-03 DIAGNOSIS — M79672 Pain in left foot: Secondary | ICD-10-CM | POA: Diagnosis not present

## 2017-12-03 DIAGNOSIS — M7662 Achilles tendinitis, left leg: Secondary | ICD-10-CM | POA: Diagnosis not present

## 2017-12-09 ENCOUNTER — Ambulatory Visit (HOSPITAL_COMMUNITY): Payer: BLUE CROSS/BLUE SHIELD

## 2017-12-14 ENCOUNTER — Ambulatory Visit (HOSPITAL_COMMUNITY): Admission: RE | Admit: 2017-12-14 | Payer: BLUE CROSS/BLUE SHIELD | Source: Ambulatory Visit

## 2017-12-17 DIAGNOSIS — G4733 Obstructive sleep apnea (adult) (pediatric): Secondary | ICD-10-CM | POA: Diagnosis not present

## 2017-12-31 DIAGNOSIS — M7662 Achilles tendinitis, left leg: Secondary | ICD-10-CM | POA: Diagnosis not present

## 2018-01-01 ENCOUNTER — Other Ambulatory Visit (HOSPITAL_COMMUNITY): Payer: Self-pay | Admitting: Internal Medicine

## 2018-01-06 NOTE — Progress Notes (Signed)
Cardiology Office Note:    Date:  01/07/2018   [name], DOB Sep 30, 1965, MRN [id number]  PCP:  Dorothyann Peng, NP  Cardiologist:  No primary care provider on file.  Harold Referring MD: Dorothyann Peng, NP   Chief Complaint  Patient presents with  . Sleep Apnea  . Hypertension    History of Present Illness:   Harold Park is a 52 y.o. male with a hx of severe OSA with an AHI of 64/hr and oxygen desaturations as low as 81% and is on CPAP at 13cm H2O.  Harold is doing well with his CPAP device.  Harold tolerates the mask and feels the pressure is adequate.  Since going on CPAP Harold feels rested in the am and has no significant daytime sleepiness.  Harold denies any significant mouth or nasal dryness or nasal congestion.  Harold does not think that Harold snores.     Past Medical History:  Diagnosis Date  . Chronic bronchitis   . HTN (hypertension)   . NICM (nonischemic cardiomyopathy) ([place])   . Obesity (BMI 30-39.9) 12/04/2015  . OSA (obstructive sleep apnea) 08/19/2015   Severe with AHI 64/hr now on CPAP at 13cm H2O  . PAF (paroxysmal atrial fibrillation) ([place])   . Systolic CHF, chronic ([place])     Past Surgical History:  Procedure Laterality Date  . APPENDECTOMY    . CARDIAC CATHETERIZATION    . CARDIOVERSION N/A 06/19/2017   Procedure: CARDIOVERSION;  Surgeon: Jolaine Artist, MD;  Location: [facility] ENDOSCOPY;  Service: Cardiovascular;  Laterality: N/A;  . LIPOMA RESECTION     arms  . SPLENECTOMY    . SPLIT NIGHT STUDY  08/12/2015    Current Medications: Current Meds  Medication Sig  . carvedilol (COREG) 25 MG tablet TAKE 1 TABLET (25 MG TOTAL) BY MOUTH 2 (TWO) TIMES DAILY WITH A MEAL.  . DULoxetine (CYMBALTA) 20 MG capsule TAKE 1 CAPSULE (20 MG TOTAL) BY MOUTH DAILY.  Marland Kitchen ELIQUIS 5 MG TABS tablet TAKE 1 TABLET (5 MG TOTAL) BY MOUTH 2 (TWO) TIMES DAILY.  Marland Kitchen ENTRESTO 97-103 MG TAKE 1 TABLET BY MOUTH TWICE A DAY  . ibuprofen (ADVIL,MOTRIN) 200 MG tablet Take 800 mg by mouth every 8  (eight) hours as needed for mild pain or moderate pain.   . methocarbamol (ROBAXIN) 500 MG tablet Take 500 mg by mouth 2 (two) times daily as needed for muscle spasms.   . metolazone (ZAROXOLYN) 5 MG tablet Take 5 mg by mouth daily as needed (fluid?). Reported on 08/28/2015  . potassium chloride SA (KLOR-CON M20) 20 MEQ tablet Take 3 tablets (60 mEq total) by mouth every morning AND 3 tablets (60 mEq total) daily with lunch AND 2 tablets (40 mEq total) at bedtime.  . ranolazine (RANEXA) 500 MG 12 hr tablet TAKE 1 TABLET (500 MG TOTAL) BY MOUTH 2 (TWO) TIMES DAILY.  Marland Kitchen spironolactone (ALDACTONE) 25 MG tablet TAKE 1 TABLET (25 MG TOTAL) BY MOUTH DAILY.  Marland Kitchen torsemide (DEMADEX) 20 MG tablet Take 2 tablets (40 mg total) by mouth 2 (two) times daily.     Allergies:   Amiodarone   Social History   Socioeconomic History  . Marital status: Married    Spouse name: Not on file  . Number of children: 3  . Years of education: Not on file  . Highest education level: Not on file  Occupational History  . Occupation: Cabin crew  Social Needs  . Financial resource strain: Not on file  .  Food insecurity:    Worry: Not on file    Inability: Not on file  . Transportation needs:    Medical: Not on file    Non-medical: Not on file  Tobacco Use  . Smoking status: Never Smoker  . Smokeless tobacco: Never Used  Substance and Sexual Activity  . Alcohol use: Yes    Comment: occasional  . Drug use: Not on file  . Sexual activity: Not on file  Lifestyle  . Physical activity:    Days per week: Not on file    Minutes per session: Not on file  . Stress: Not on file  Relationships  . Social connections:    Talks on phone: Not on file    Gets together: Not on file    Attends religious service: Not on file    Active member of club or organization: Not on file    Attends meetings of clubs or organizations: Not on file    Relationship status: Not on file  Other Topics Concern  . Not on file  Social History  Narrative  . Not on file     Family History: The patient's family history includes Cancer in his mother; Heart disease in his unknown relative; Hypertension in his unknown relative.  ROS:   Please see the history of present illness.    ROS  All other systems reviewed and negative.   EKGs/Labs/Other Studies Reviewed:    The following studies were reviewed today: PAP download  EKG:  EKG is not ordered today.    Recent Labs: 06/17/2017: Magnesium 2.0 11/20/2017: BUN 27; Creatinine, Ser 1.38; Hemoglobin 17.3; Platelets 296; Potassium 3.3; Sodium 138   Recent Lipid Panel    Component Value Date/Time   CHOL  01/24/2010 0448    111        ATP III CLASSIFICATION:  <200     mg/dL   Desirable  200-239  mg/dL   Borderline High  >=240    mg/dL   High          TRIG 120 01/24/2010 0448   HDL 28 (L) 01/24/2010 0448   CHOLHDL 4.0 01/24/2010 0448   VLDL 24 01/24/2010 0448   LDLCALC  01/24/2010 0448    59        Total Cholesterol/HDL:CHD Risk Coronary Heart Disease Risk Table                     Men   Women  1/2 Average Risk   3.4   3.3  Average Risk       5.0   4.4  2 X Average Risk   9.6   7.1  3 X Average Risk  23.4   11.0        Use the calculated Patient Ratio above and the CHD Risk Table to determine the patient's CHD Risk.        ATP III CLASSIFICATION (LDL):  <100     mg/dL   Optimal  100-129  mg/dL   Near or Above                    Optimal  130-159  mg/dL   Borderline  160-189  mg/dL   High  >190     mg/dL   Very High    Physical Exam:    VS:  BP 116/80   Pulse 80   Ht 6\' 5"  (1.956 m)   Wt (!) 331 lb 12.8 oz (150.5 kg)  SpO2 97%   BMI 39.35 kg/m     Wt Readings from Last 3 Encounters:  01/07/18 (!) 331 lb 12.8 oz (150.5 kg)  11/20/17 (!) 325 lb 2 oz (147.5 kg)  08/13/17 (!) 324 lb 12.8 oz (147.3 kg)     GEN:  Well nourished, well developed in no acute distress HEENT: Normal NECK: No JVD; No carotid bruits LYMPHATICS: No lymphadenopathy CARDIAC:  RRR, no murmurs, rubs, gallops RESPIRATORY:  Clear to auscultation without rales, wheezing or rhonchi  ABDOMEN: Soft, non-tender, non-distended MUSCULOSKELETAL:  No edema; No deformity  SKIN: Warm and dry NEUROLOGIC:  Alert and oriented x 3 PSYCHIATRIC:  Normal affect   ASSESSMENT:    1. OSA (obstructive sleep apnea)   2. Essential hypertension   3. Obesity (BMI 30-39.9)    PLAN:    In order of problems listed above:  1.  OSA - the patient is tolerating PAP therapy well without any problems. The PAP download was reviewed today and showed an AHI of 0.9/hr on 13 cm H2O with 100% compliance in using more than 4 hours nightly.  The patient has been using and benefiting from PAP use and will continue to benefit from therapy. Harold was noted to have polycythemia and there was concern whether Harold could be having hypoxemia at night despite CPAP.  I will get an overnight pulse ox on PAP therapy.    2  HTN - BP is controlled on exam today. Harold will continue on Spiro 25mg  daily, Carvedilol 25mg  BID and Entresto 97-103mg  BID.   3.  Obesity - I have encouraged him to get into a routine exercise program and cut back on carbs and portions.    Medication Adjustments/Labs and Tests Ordered: Current medicines are reviewed at length with the patient today.  Concerns regarding medicines are outlined above.  No orders of the defined types were placed in this encounter.  No orders of the defined types were placed in this encounter.   Signed, Fransico Him, MD  01/07/2018 8:13 AM    [place]

## 2018-01-07 ENCOUNTER — Ambulatory Visit (INDEPENDENT_AMBULATORY_CARE_PROVIDER_SITE_OTHER): Payer: BLUE CROSS/BLUE SHIELD | Admitting: Cardiology

## 2018-01-07 ENCOUNTER — Other Ambulatory Visit (HOSPITAL_COMMUNITY): Payer: Self-pay | Admitting: Pharmacist

## 2018-01-07 ENCOUNTER — Other Ambulatory Visit (HOSPITAL_COMMUNITY): Payer: Self-pay | Admitting: Internal Medicine

## 2018-01-07 ENCOUNTER — Encounter: Payer: Self-pay | Admitting: Cardiology

## 2018-01-07 VITALS — BP 116/80 | HR 80 | Ht 77.0 in | Wt 331.8 lb

## 2018-01-07 DIAGNOSIS — I10 Essential (primary) hypertension: Secondary | ICD-10-CM | POA: Diagnosis not present

## 2018-01-07 DIAGNOSIS — G4733 Obstructive sleep apnea (adult) (pediatric): Secondary | ICD-10-CM | POA: Diagnosis not present

## 2018-01-07 DIAGNOSIS — E669 Obesity, unspecified: Secondary | ICD-10-CM

## 2018-01-07 MED ORDER — POTASSIUM CHLORIDE CRYS ER 20 MEQ PO TBCR: EXTENDED_RELEASE_TABLET | ORAL | 3 refills | Status: DC

## 2018-01-07 MED ORDER — SPIRONOLACTONE 25 MG PO TABS: 25.0000 mg | ORAL_TABLET | Freq: Every day | ORAL | 3 refills | Status: DC

## 2018-01-07 NOTE — Patient Instructions (Signed)
Medication Instructions:  Your physician recommends that you continue on your current medications as directed. Please refer to the Current Medication list given to you today.  If you need a refill on your cardiac medications before your next appointment, please call your pharmacy.   Lab work:  If you have labs (blood work) drawn today and your tests are completely normal, you will receive your results only by: Marland Kitchen MyChart Message (if you have MyChart) OR . A paper copy in the mail If you have any lab test that is abnormal or we need to change your treatment, we will call you to review the results.  Testing/Procedures:  Overnight pulse ox on CPAP  Follow-Up: At [facility], you and your health needs are our priority.  As part of our continuing mission to provide you with exceptional heart care, we have created designated Provider Care Teams.  These Care Teams include your primary Cardiologist (physician) and Advanced Practice Providers (APPs -  Physician Assistants and Nurse Practitioners) who all work together to provide you with the care you need, when you need it. You will need a follow up appointment in 1 years.  Please call our office 2 months in advance to schedule this appointment.  You may see Dr. Radford Pax or one of the following Advanced Practice Providers on your designated Care Team:   [place], PA-C Melina Copa, PA-C . Ermalinda Barrios, PA-C

## 2018-01-08 ENCOUNTER — Telehealth: Payer: Self-pay | Admitting: *Deleted

## 2018-01-08 NOTE — Telephone Encounter (Signed)
-----   Message from Sarina Ill, RN sent at 01/07/2018 12:15 PM EDT ----- Regarding: Overnight Pulse ox Patient is ordered an Overnight pulse ox, orders placed, please send and schedule. Thanks, [employer]

## 2018-01-08 NOTE — Telephone Encounter (Signed)
Orders faxed to [facility].  

## 2018-01-20 ENCOUNTER — Ambulatory Visit (INDEPENDENT_AMBULATORY_CARE_PROVIDER_SITE_OTHER)
Admission: RE | Admit: 2018-01-20 | Discharge: 2018-01-20 | Disposition: A | Discharge status: Home / Self Care | Payer: BLUE CROSS/BLUE SHIELD | Source: Ambulatory Visit | Attending: Internal Medicine | Admitting: Internal Medicine

## 2018-01-20 DIAGNOSIS — I7121 Aneurysm of the ascending aorta, without rupture: Secondary | ICD-10-CM

## 2018-01-20 DIAGNOSIS — I719 Aortic aneurysm of unspecified site, without rupture: Secondary | ICD-10-CM

## 2018-01-20 DIAGNOSIS — I77810 Thoracic aortic ectasia: Secondary | ICD-10-CM | POA: Diagnosis not present

## 2018-01-20 MED ORDER — IOPAMIDOL (ISOVUE-370) INJECTION 76%: 100.0000 mL | Freq: Once | INTRAVENOUS | Status: DC | PRN

## 2018-01-20 MED ORDER — IOPAMIDOL (ISOVUE-370) INJECTION 76%
100.0000 mL | Freq: Once | INTRAVENOUS | Status: AC | PRN
  Administered 2018-01-20: 100 mL via INTRAVENOUS

## 2018-02-02 ENCOUNTER — Encounter (HOSPITAL_COMMUNITY): Payer: Self-pay | Admitting: Cardiology

## 2018-02-20 ENCOUNTER — Other Ambulatory Visit: Payer: Self-pay | Admitting: Internal Medicine

## 2018-03-25 DIAGNOSIS — H26491 Other secondary cataract, right eye: Secondary | ICD-10-CM | POA: Diagnosis not present

## 2018-03-25 DIAGNOSIS — H33001 Unspecified retinal detachment with retinal break, right eye: Secondary | ICD-10-CM | POA: Diagnosis not present

## 2018-03-29 DIAGNOSIS — G4733 Obstructive sleep apnea (adult) (pediatric): Secondary | ICD-10-CM | POA: Diagnosis not present

## 2018-05-10 DIAGNOSIS — M25572 Pain in left ankle and joints of left foot: Secondary | ICD-10-CM | POA: Diagnosis not present

## 2018-05-14 DIAGNOSIS — M25572 Pain in left ankle and joints of left foot: Secondary | ICD-10-CM | POA: Diagnosis not present

## 2018-06-28 ENCOUNTER — Other Ambulatory Visit (HOSPITAL_COMMUNITY): Payer: Self-pay | Admitting: Internal Medicine

## 2018-07-01 DIAGNOSIS — G4733 Obstructive sleep apnea (adult) (pediatric): Secondary | ICD-10-CM | POA: Diagnosis not present

## 2018-08-02 DIAGNOSIS — H26491 Other secondary cataract, right eye: Secondary | ICD-10-CM | POA: Diagnosis not present

## 2018-08-10 ENCOUNTER — Other Ambulatory Visit: Payer: Self-pay | Admitting: Cardiovascular Disease

## 2018-08-29 ENCOUNTER — Other Ambulatory Visit: Payer: Self-pay | Admitting: Internal Medicine

## 2018-10-25 ENCOUNTER — Other Ambulatory Visit (HOSPITAL_COMMUNITY): Payer: Self-pay | Admitting: *Deleted

## 2018-10-25 MED ORDER — TORSEMIDE 20 MG PO TABS: 40.0000 mg | ORAL_TABLET | Freq: Two times a day (BID) | ORAL | 3 refills | Status: DC

## 2018-11-01 DIAGNOSIS — G4733 Obstructive sleep apnea (adult) (pediatric): Secondary | ICD-10-CM | POA: Diagnosis not present

## 2018-11-11 ENCOUNTER — Telehealth: Payer: Self-pay

## 2018-11-11 NOTE — Telephone Encounter (Signed)
lvm for pt to call back to get in for a physical

## 2018-11-25 ENCOUNTER — Other Ambulatory Visit (HOSPITAL_COMMUNITY): Payer: Self-pay | Admitting: Internal Medicine

## 2019-01-13 ENCOUNTER — Other Ambulatory Visit (HOSPITAL_COMMUNITY): Payer: Self-pay

## 2019-01-13 MED ORDER — CARVEDILOL 25 MG PO TABS: 25.0000 mg | ORAL_TABLET | Freq: Two times a day (BID) | ORAL | 0 refills | Status: DC

## 2019-02-01 ENCOUNTER — Telehealth (HOSPITAL_COMMUNITY): Payer: Self-pay

## 2019-02-01 NOTE — Telephone Encounter (Signed)
COVID-19 pre-appointment screening questions: °  °Do you have a history of COVID-19 or a positive test result in the past 7-10 days? NO ° °To the best of your knowledge, have you been in close contact with anyone with a confirmed diagnosis of COVID 19? NO ° °Have you had any one or more of the following: Fever, chills, cough, shortness of breath (out of the normal for you) or any flu-like symptoms? NO ° °Are you experiencing any of the following symptoms that is new or out of usual for you: NO ° °• Ear, nose or throat discomfort °• Sore throat °• Headache °• Muscle Pain °• Diarrhea °• Loss of taste or smell ° ° °Reviewed all the following with patient: °• Use of hand sanitizer when entering the building °• Everyone is required to wear a mask in the building, if you do not have a mask we are happy to provide you with one when you arrive °• NO Visitor guidelines ° ° °If patient answers YES to any of questions they must change to a virtual visit and place note in comments about symptoms ° °

## 2019-02-02 ENCOUNTER — Ambulatory Visit (HOSPITAL_COMMUNITY)
Admission: RE | Admit: 2019-02-02 | Discharge: 2019-02-02 | Disposition: A | Discharge status: Home / Self Care | Payer: BC Managed Care – PPO | Source: Ambulatory Visit | Attending: Internal Medicine | Admitting: Internal Medicine

## 2019-02-02 ENCOUNTER — Encounter (HOSPITAL_COMMUNITY): Payer: Self-pay | Admitting: Internal Medicine

## 2019-02-02 ENCOUNTER — Other Ambulatory Visit: Payer: Self-pay

## 2019-02-02 VITALS — BP 97/69 | HR 79 | Wt 330.6 lb

## 2019-02-02 DIAGNOSIS — I719 Aortic aneurysm of unspecified site, without rupture: Secondary | ICD-10-CM

## 2019-02-02 DIAGNOSIS — E876 Hypokalemia: Secondary | ICD-10-CM | POA: Diagnosis not present

## 2019-02-02 DIAGNOSIS — I272 Pulmonary hypertension, unspecified: Secondary | ICD-10-CM | POA: Diagnosis not present

## 2019-02-02 DIAGNOSIS — I480 Paroxysmal atrial fibrillation: Secondary | ICD-10-CM

## 2019-02-02 DIAGNOSIS — I493 Ventricular premature depolarization: Secondary | ICD-10-CM | POA: Insufficient documentation

## 2019-02-02 DIAGNOSIS — Z8249 Family history of ischemic heart disease and other diseases of the circulatory system: Secondary | ICD-10-CM | POA: Diagnosis not present

## 2019-02-02 DIAGNOSIS — Z79899 Other long term (current) drug therapy: Secondary | ICD-10-CM | POA: Insufficient documentation

## 2019-02-02 DIAGNOSIS — Z7901 Long term (current) use of anticoagulants: Secondary | ICD-10-CM | POA: Diagnosis not present

## 2019-02-02 DIAGNOSIS — Z888 Allergy status to other drugs, medicaments and biological substances status: Secondary | ICD-10-CM | POA: Insufficient documentation

## 2019-02-02 DIAGNOSIS — Z803 Family history of malignant neoplasm of breast: Secondary | ICD-10-CM | POA: Insufficient documentation

## 2019-02-02 DIAGNOSIS — I5022 Chronic systolic (congestive) heart failure: Secondary | ICD-10-CM

## 2019-02-02 DIAGNOSIS — Q2543 Congenital aneurysm of aorta: Secondary | ICD-10-CM

## 2019-02-02 DIAGNOSIS — E669 Obesity, unspecified: Secondary | ICD-10-CM | POA: Diagnosis not present

## 2019-02-02 DIAGNOSIS — I77810 Thoracic aortic ectasia: Secondary | ICD-10-CM | POA: Diagnosis not present

## 2019-02-02 DIAGNOSIS — I4892 Unspecified atrial flutter: Secondary | ICD-10-CM

## 2019-02-02 DIAGNOSIS — I444 Left anterior fascicular block: Secondary | ICD-10-CM | POA: Insufficient documentation

## 2019-02-02 DIAGNOSIS — I10 Essential (primary) hypertension: Secondary | ICD-10-CM

## 2019-02-02 DIAGNOSIS — I110 Hypertensive heart disease with heart failure: Secondary | ICD-10-CM | POA: Diagnosis not present

## 2019-02-02 DIAGNOSIS — I428 Other cardiomyopathies: Secondary | ICD-10-CM | POA: Diagnosis not present

## 2019-02-02 DIAGNOSIS — G4733 Obstructive sleep apnea (adult) (pediatric): Secondary | ICD-10-CM | POA: Diagnosis not present

## 2019-02-02 DIAGNOSIS — I7121 Aneurysm of the ascending aorta, without rupture: Secondary | ICD-10-CM

## 2019-02-02 LAB — BASIC METABOLIC PANEL
Anion gap: 10 (ref 5–15)
BUN: 24 mg/dL — ABNORMAL HIGH (ref 6–20)
CO2: 22 mmol/L (ref 22–32)
Calcium: 9.3 mg/dL (ref 8.9–10.3)
Chloride: 108 mmol/L (ref 98–111)
Creatinine, Ser: 1.11 mg/dL (ref 0.61–1.24)
GFR calc Af Amer: >60 mL/min (ref >60)
GFR calc non Af Amer: >60 mL/min (ref >60)
Glucose, Bld: 105 mg/dL — ABNORMAL HIGH (ref 70–99)
Potassium: 4.9 mmol/L (ref 3.5–5.1)
Sodium: 140 mmol/L (ref 135–145)

## 2019-02-02 LAB — LIPID PANEL
Cholesterol: 158 mg/dL (ref 0–200)
HDL: 28 mg/dL — ABNORMAL LOW (ref >40)
LDL Cholesterol: 75 mg/dL (ref 0–99)
Total CHOL/HDL Ratio: 5.6 RATIO
Triglycerides: 275 mg/dL — ABNORMAL HIGH (ref <150)
VLDL: 55 mg/dL — ABNORMAL HIGH (ref 0–40)

## 2019-02-02 LAB — CBC
HCT: 53.9 % — ABNORMAL HIGH (ref 39.0–52.0)
Hemoglobin: 17.9 g/dL — ABNORMAL HIGH (ref 13.0–17.0)
MCH: 29.7 pg (ref 26.0–34.0)
MCHC: 33.2 g/dL (ref 30.0–36.0)
MCV: 89.5 fL (ref 80.0–100.0)
Platelets: 294 10*3/uL (ref 150–400)
RBC: 6.02 MIL/uL — ABNORMAL HIGH (ref 4.22–5.81)
RDW: 13.9 % (ref 11.5–15.5)
WBC: 11.9 10*3/uL — ABNORMAL HIGH (ref 4.0–10.5)
nRBC: 0 % (ref 0.0–0.2)

## 2019-02-02 LAB — HEMOGLOBIN A1C
Hgb A1c MFr Bld: 5.8 % — ABNORMAL HIGH (ref 4.8–5.6)
Mean Plasma Glucose: 119.76 mg/dL

## 2019-02-02 LAB — BRAIN NATRIURETIC PEPTIDE: B Natriuretic Peptide: 17.5 pg/mL (ref 0.0–100.0)

## 2019-02-02 MED ORDER — SPIRONOLACTONE 50 MG PO TABS: 50.0000 mg | ORAL_TABLET | Freq: Every day | ORAL | 11 refills | Status: DC

## 2019-02-02 MED ORDER — SILDENAFIL CITRATE 100 MG PO TABS: 100.0000 mg | ORAL_TABLET | Freq: Every day | ORAL | 1 refills | Status: DC | PRN

## 2019-02-02 NOTE — Patient Instructions (Signed)
Increase Spironolactone to 50 mg daily  Take Sildenafil 100 mg as needed, prescription has been sent in for 10 tablets  Labs done today, we will notify you for abnormal results  Labs in 2 weeks  Your physician has requested that you have an echocardiogram. Echocardiography is a painless test that uses sound waves to create images of your heart. It provides your doctor with information about the size and shape of your heart and how well your heart's chambers and valves are working. This procedure takes approximately one hour. There are no restrictions for this procedure.  We will contact you in 6 months to schedule your next appointment.  If you have any questions or concerns before your next appointment please send [place] a message through [place] or call our office at [phone].  At the [facility], you and your health needs are our priority. As part of our continuing mission to provide you with exceptional heart care, we have created designated Provider Care Teams. These Care Teams include your primary Cardiologist (physician) and Advanced Practice Providers (APPs- Physician Assistants and Nurse Practitioners) who all work together to provide you with the care you need, when you need it.   You may see any of the following providers on your designated Care Team at your next follow up: Marland Kitchen Dr Glori Bickers . Dr Loralie Champagne . Darrick Grinder, NP . Lyda Jester, PA   Please be sure to bring in all your medications bottles to every appointment.

## 2019-02-02 NOTE — Progress Notes (Signed)
Patient ID: Harold Park, male   DOB: 18-Jun-1965, 53 y.o.   MRN: [id number]    Advanced Heart Failure Clinic Note   Primary Care: Harold Post, MD Primary Cardiologist: Harold Hector, MD HF MD: Dr Harold Park  HPI: Harold Park is a 53 y.o. male with NICM with EF [id number], chronic systolic CHF, parox A-Flutter with RVR (01/2010) and HTN. He had a cath in 01/2010 demonstrating normal cors, EF 25-30% and moderate pulmonary HTN. Echo done in 01/2010 demonstrated EF 30-35%, mild MR, mild LAE, mild RVE.  Last seen in [facility] clinic 06/25/15, noted to be in afib RVR 132. Started on eliquis, coreg increased, and TEE/DCCV scheduled for 06/29/15.   Was in NSR on his arrival by EKG so this was cancelled.   We saw him for the first time in May 2017. At that time we switched losartan to [facility] and referred to Juan Quam and had + sleep study with severe OSA 64/hr. Also saw Dr. Caryl Park in EP. Holter monitor placed to quantify PVCs---> - 6.3%. Unable to tolerate amio in past due to severe nausea. Remains on Ranexa 500 bid to try to suppress PVCs.   Underwent DC-CV on 06/19/17 for recurrent AF  Seen in AF Clinic on 07/02/17. Felt not to be ideal candidate for AF ablation at this time but willing to consider with some lifestyle changes/weight loss.   He presents today for regular follow up. Says he is doing ok. No CP. Mild LE edema. Struggling with low potassium. Also having problems with ED. Feels his exercise capacity is much better. Doing projects at home without problems. Wearing CPAP. Follows with Dr. Radford Park.   Echo 9/19 EF 35-40%. RV ok.   ECHO  01/2017 EF 35% Grade IDD RV normal. 12/11/2015 EF 35-40%    12/04/14 LVEF 30-35%, trivial AI, Mod LAE  Social: Works in Personal assistant, Lives in [place]. 3 kids  Past Medical History:  Diagnosis Date  . Chronic bronchitis   . HTN (hypertension)   . NICM (nonischemic cardiomyopathy) ([place])   . Obesity (BMI 30-39.9) 12/04/2015  . OSA (obstructive sleep  apnea) 08/19/2015   Severe with AHI 64/hr now on CPAP at 13cm H2O  . PAF (paroxysmal atrial fibrillation) ([place])   . Systolic CHF, chronic (HCC)     Current Outpatient Medications  Medication Sig Dispense Refill  . carvedilol (COREG) 25 MG tablet Take 1 tablet (25 mg total) by mouth 2 (two) times daily with a meal. Needs appt 180 tablet 0  . DULoxetine (CYMBALTA) 20 MG capsule TAKE 1 CAPSULE (20 MG TOTAL) BY MOUTH DAILY. 90 capsule 3  . ELIQUIS 5 MG TABS tablet TAKE 1 TABLET BY MOUTH TWICE A DAY 180 tablet 1  . ENTRESTO 97-103 MG TAKE 1 TABLET BY MOUTH TWICE A DAY 180 tablet 2  . ibuprofen (ADVIL,MOTRIN) 200 MG tablet Take 800 mg by mouth every 8 (eight) hours as needed for mild pain or moderate pain.     . methocarbamol (ROBAXIN) 500 MG tablet Take 500 mg by mouth 2 (two) times daily as needed for muscle spasms.     . metolazone (ZAROXOLYN) 5 MG tablet Take 5 mg by mouth daily as needed (fluid?). Reported on 08/28/2015    . potassium chloride SA (KLOR-CON) 20 MEQ tablet Take 4 tablets by mouth every morning 2 tablets with lunch and 4 tablets every evening    . ranolazine (RANEXA) 500 MG 12 hr tablet TAKE 1 TABLET (500 MG TOTAL) BY MOUTH 2 (TWO)  TIMES DAILY. 180 tablet 2  . spironolactone (ALDACTONE) 25 MG tablet Take 1 tablet (25 mg total) by mouth daily. 90 tablet 3  . torsemide (DEMADEX) 20 MG tablet Take 20 mg by mouth 2 (two) times daily.     No current facility-administered medications for this encounter.     Allergies  Allergen Reactions  . Amiodarone Nausea Only    Social History   Socioeconomic History  . Marital status: Married    Spouse name: Not on file  . Number of children: 3  . Years of education: Not on file  . Highest education level: Not on file  Occupational History  . Occupation: Cabin crew  Social Needs  . Financial resource strain: Not on file  . Food insecurity    Worry: Not on file    Inability: Not on file  . Transportation needs    Medical: Not on file     Non-medical: Not on file  Tobacco Use  . Smoking status: Never Smoker  . Smokeless tobacco: Never Used  Substance and Sexual Activity  . Alcohol use: Yes    Comment: occasional  . Drug use: Not on file  . Sexual activity: Not on file  Lifestyle  . Physical activity    Days per week: Not on file    Minutes per session: Not on file  . Stress: Not on file  Relationships  . Social Herbalist on phone: Not on file    Gets together: Not on file    Attends religious service: Not on file    Active member of club or organization: Not on file    Attends meetings of clubs or organizations: Not on file    Relationship status: Not on file  . Intimate partner violence    Fear of current or ex partner: Not on file    Emotionally abused: Not on file    Physically abused: Not on file    Forced sexual activity: Not on file  Other Topics Concern  . Not on file  Social History Narrative  . Not on file    Family History  Problem Relation Age of Onset  . Cancer Mother        breast  . Hypertension Unknown   . Heart disease Unknown    Vitals:   02/02/19 0955  BP: 97/69  Pulse: 79  SpO2: 96%  Weight: (!) 150 kg (330 lb 9.6 oz)    Wt Readings from Last 3 Encounters:  02/02/19 (!) 150 kg (330 lb 9.6 oz)  01/07/18 (!) 150.5 kg (331 lb 12.8 oz)  11/20/17 (!) 147.5 kg (325 lb 2 oz)     PHYSICAL EXAM: General:  Well appearing. No resp difficulty HEENT: normal Neck: supple. no JVD. Carotids 2+ bilat; no bruits. No lymphadenopathy or thryomegaly appreciated. Cor: PMI nondisplaced. Regular rate & rhythm. No rubs, gallops or murmurs. Lungs: clear Abdomen: obese, soft, nontender, nondistended. No hepatosplenomegaly. No bruits or masses. Good bowel sounds. Extremities: no cyanosis, clubbing, rash, edema Neuro: alert & orientedx3, cranial nerves grossly intact. moves all 4 extremities w/o difficulty. Affect pleasant  ECG: NSR 88 LAFB No ST-T abnormality Personally reviewed    ASSESSMENT & PLAN:  1. Chronic systolic HF - NICM - ECHO 01/2017 EF 35%. - Echo 9/19 EF stable at 35-40%.  - Stable NYHA II - Volume status stable  - Continue torsemide 40 bid. Take metolazone as needed - Continue carvedilol 25 mg twice a day.  - Continue  entresto 97-103 mg twice a day.  - Struggling with hypokalemia. Will increase spiro to 50 mg daily. Repeat BMET today and in 2 weeks  - Reinforced fluid restriction to < 2 L daily, sodium restriction to less than 2000 mg daily, and the importance of daily weights.   - Repeat echo for EF and AoRoot sizing  - Start Farxiga at next visit 2. PAF - s/p DC-CV 06/19/17 - has been seen in AF Clinic. Felt that he needs to lose weight prior to considering AF ablation. He is working on weight loss.  - Remains in NSR on ECG today  - Continue Eliquis. No bleeding currently  - Intolerant amiodarone---> nausea/vomiting. Remains on Ranexa.  3. HTN - BP soft but ok. No chang 4. Morbid Obesity  - Encouraged weight loss 5. Aortic Root  - 4.6 cm by Echo 9/19. 4.3 cm previously - CT chest 11/19 Ao Root 4.6cm. Remainder of aorta ok  - Repeat echo 6. Severe OSA (64/hr)  - Followed by Dr. Radford Park. Good control  7. Frequent PVCs - Stable - Continue Ranexa 8. ED - start sildenafil   Glori Bickers, MD  10:04 AM

## 2019-02-02 NOTE — Addendum Note (Signed)
Encounter addended by: Scarlette Calico, RN on: 02/02/2019 10:35 AM  Actions taken: Visit diagnoses modified, Order list changed, Diagnosis association updated, Charge Capture section accepted, Clinical Note Signed

## 2019-02-05 DIAGNOSIS — G4733 Obstructive sleep apnea (adult) (pediatric): Secondary | ICD-10-CM | POA: Diagnosis not present

## 2019-02-09 ENCOUNTER — Other Ambulatory Visit (HOSPITAL_COMMUNITY): Payer: Self-pay

## 2019-02-09 ENCOUNTER — Other Ambulatory Visit (HOSPITAL_COMMUNITY): Payer: Self-pay | Admitting: Internal Medicine

## 2019-02-09 MED ORDER — SPIRONOLACTONE 50 MG PO TABS: 50.0000 mg | ORAL_TABLET | Freq: Every day | ORAL | 11 refills | Status: DC

## 2019-02-14 ENCOUNTER — Other Ambulatory Visit (HOSPITAL_COMMUNITY): Payer: Self-pay

## 2019-02-14 MED ORDER — SPIRONOLACTONE 50 MG PO TABS: 50.0000 mg | ORAL_TABLET | Freq: Every day | ORAL | 11 refills | Status: DC

## 2019-02-16 ENCOUNTER — Other Ambulatory Visit: Payer: Self-pay

## 2019-02-16 ENCOUNTER — Ambulatory Visit (HOSPITAL_COMMUNITY)
Admission: RE | Admit: 2019-02-16 | Discharge: 2019-02-16 | Disposition: A | Discharge status: Home / Self Care | Payer: BC Managed Care – PPO | Source: Ambulatory Visit | Attending: Internal Medicine | Admitting: Internal Medicine

## 2019-02-16 ENCOUNTER — Ambulatory Visit (HOSPITAL_COMMUNITY)
Admission: RE | Admit: 2019-02-16 | Discharge: 2019-02-16 | Disposition: A | Discharge status: Home / Self Care | Payer: BC Managed Care – PPO | Source: Ambulatory Visit | Attending: Cardiology | Admitting: Cardiology

## 2019-02-16 DIAGNOSIS — I4891 Unspecified atrial fibrillation: Secondary | ICD-10-CM | POA: Diagnosis not present

## 2019-02-16 DIAGNOSIS — I509 Heart failure, unspecified: Secondary | ICD-10-CM | POA: Diagnosis not present

## 2019-02-16 DIAGNOSIS — I5022 Chronic systolic (congestive) heart failure: Secondary | ICD-10-CM

## 2019-02-16 DIAGNOSIS — I340 Nonrheumatic mitral (valve) insufficiency: Secondary | ICD-10-CM | POA: Insufficient documentation

## 2019-02-16 DIAGNOSIS — I110 Hypertensive heart disease with heart failure: Secondary | ICD-10-CM | POA: Insufficient documentation

## 2019-02-16 LAB — BASIC METABOLIC PANEL
Anion gap: 13 (ref 5–15)
BUN: 23 mg/dL — ABNORMAL HIGH (ref 6–20)
CO2: 20 mmol/L — ABNORMAL LOW (ref 22–32)
Calcium: 9.1 mg/dL (ref 8.9–10.3)
Chloride: 107 mmol/L (ref 98–111)
Creatinine, Ser: 1.14 mg/dL (ref 0.61–1.24)
GFR calc Af Amer: >60 mL/min (ref >60)
GFR calc non Af Amer: >60 mL/min (ref >60)
Glucose, Bld: 179 mg/dL — ABNORMAL HIGH (ref 70–99)
Potassium: 4.2 mmol/L (ref 3.5–5.1)
Sodium: 140 mmol/L (ref 135–145)

## 2019-02-16 NOTE — Progress Notes (Signed)
Echocardiogram 2D Echocardiogram has been performed.  Oneal Deputy  02/16/2019, 9:34 AM

## 2019-03-05 ENCOUNTER — Other Ambulatory Visit (HOSPITAL_COMMUNITY): Payer: Self-pay | Admitting: Internal Medicine

## 2019-03-07 ENCOUNTER — Other Ambulatory Visit (HOSPITAL_COMMUNITY): Payer: Self-pay

## 2019-03-09 ENCOUNTER — Other Ambulatory Visit (HOSPITAL_COMMUNITY): Payer: Self-pay

## 2019-03-09 MED ORDER — SPIRONOLACTONE 50 MG PO TABS: 50.0000 mg | ORAL_TABLET | Freq: Every day | ORAL | 11 refills | Status: DC

## 2019-04-25 ENCOUNTER — Other Ambulatory Visit (HOSPITAL_COMMUNITY): Payer: Self-pay | Admitting: Internal Medicine

## 2019-04-27 ENCOUNTER — Other Ambulatory Visit (HOSPITAL_COMMUNITY): Payer: Self-pay

## 2019-04-27 MED ORDER — SPIRONOLACTONE 50 MG PO TABS: 50.0000 mg | ORAL_TABLET | Freq: Every day | ORAL | 3 refills | Status: DC

## 2019-05-06 DIAGNOSIS — G4733 Obstructive sleep apnea (adult) (pediatric): Secondary | ICD-10-CM | POA: Diagnosis not present

## 2019-05-18 ENCOUNTER — Other Ambulatory Visit (HOSPITAL_COMMUNITY): Payer: Self-pay | Admitting: Cardiology

## 2019-06-28 ENCOUNTER — Other Ambulatory Visit (HOSPITAL_COMMUNITY): Payer: Self-pay

## 2019-06-28 MED ORDER — METOLAZONE 5 MG PO TABS: ORAL_TABLET | ORAL | 0 refills | Status: DC

## 2019-07-21 ENCOUNTER — Other Ambulatory Visit: Payer: Self-pay | Admitting: Cardiovascular Disease

## 2019-07-21 NOTE — Telephone Encounter (Signed)
Pt's pharmacy is requesting a refill on duloxetine. Would Dr. [name] like to refill this medication? Please address

## 2019-08-01 ENCOUNTER — Other Ambulatory Visit: Payer: Self-pay | Admitting: Internal Medicine

## 2019-08-04 DIAGNOSIS — G4733 Obstructive sleep apnea (adult) (pediatric): Secondary | ICD-10-CM | POA: Diagnosis not present

## 2019-08-10 ENCOUNTER — Other Ambulatory Visit (HOSPITAL_COMMUNITY): Payer: Self-pay | Admitting: Internal Medicine

## 2019-09-07 ENCOUNTER — Telehealth: Payer: Self-pay | Admitting: *Deleted

## 2019-09-07 ENCOUNTER — Encounter: Payer: Self-pay | Admitting: Cardiology

## 2019-09-07 ENCOUNTER — Telehealth (INDEPENDENT_AMBULATORY_CARE_PROVIDER_SITE_OTHER): Payer: BC Managed Care – PPO | Admitting: Cardiology

## 2019-09-07 ENCOUNTER — Other Ambulatory Visit: Payer: Self-pay

## 2019-09-07 VITALS — HR 97 | Ht 77.0 in | Wt 335.0 lb

## 2019-09-07 DIAGNOSIS — G4733 Obstructive sleep apnea (adult) (pediatric): Secondary | ICD-10-CM | POA: Diagnosis not present

## 2019-09-07 DIAGNOSIS — I10 Essential (primary) hypertension: Secondary | ICD-10-CM | POA: Diagnosis not present

## 2019-09-07 DIAGNOSIS — E669 Obesity, unspecified: Secondary | ICD-10-CM | POA: Diagnosis not present

## 2019-09-07 NOTE — Telephone Encounter (Signed)
-----   Message from Sueanne Margarita, MD sent at 09/07/2019  9:00 AM EDT ----- Patients tube is not heating and has never worked right and water is running out in container prior to am.  Please call DME and tell them they need to address this.  He has talked to them and they told him they would make a note of it but never dealt with it. Followup with me in 1 year

## 2019-09-07 NOTE — Telephone Encounter (Signed)
Reached out to [place] to ask one of the RT's Suanne Marker) to call the patient to offer assistance. Suanne Marker states the patient had his humidity up too high and she will call to offer assistance to get it back down.

## 2019-09-07 NOTE — Progress Notes (Signed)
Virtual Visit via Telephone Note   This visit type was conducted due to national recommendations for restrictions regarding the COVID-19 Pandemic (e.g. social distancing) in an effort to limit this patient's exposure and mitigate transmission in our community.  Due to his co-morbid illnesses, this patient is at least at moderate risk for complications without adequate follow up.  This format is felt to be most appropriate for this patient at this time.  The patient did not have access to video technology/had technical difficulties with video requiring transitioning to audio format only (telephone).  All issues noted in this document were discussed and addressed.  No physical exam could be performed with this format.  Please refer to the patient's chart for his  consent to telehealth for [facility].  Evaluation Performed:  Follow-up visit  This visit type was conducted due to national recommendations for restrictions regarding the COVID-19 Pandemic (e.g. social distancing).  This format is felt to be most appropriate for this patient at this time.  All issues noted in this document were discussed and addressed.  No physical exam was performed (except for noted visual exam findings with Video Visits).  Please refer to the patient's chart (MyChart message for video visits and phone note for telephone visits) for the patient's consent to telehealth for [facility].  Date:  09/07/2019   [name], DOB [birth year], MRN [id number]  Patient Location:  Home  Provider location:   [place]  PCP:  Harold Post, MD  Cardiologist:  Harold Champagne, MD Sleep Medicine:  Harold Him, MD Electrophysiologist:  None   Chief Complaint:  OSA  History of Present Illness:    Harold Park is a 54 y.o. male who presents via audio/video conferencing for a telehealth visit today.    Harold Park is a 54 y.o. male with a hx of severe OSA with an AHI of 64/hr and oxygen desaturations as  low as 81% and is on CPAP at 13cm H2O.  She is doing well with her CPAP device and thinks that she has gotten used to it.  She tolerates the mask and feels the pressure is adequate.  Since going on CPAP she feels rested in the am and has no significant daytime sleepiness.  She denies any significant mouth or nasal dryness or nasal congestion.  She does not think that he snores.    The patient does not have symptoms concerning for COVID-19 infection (fever, chills, cough, or new shortness of breath).    Prior CV studies:   The following studies were reviewed today:  PAP compliance download  Past Medical History:  Diagnosis Date  . Chronic bronchitis   . HTN (hypertension)   . NICM (nonischemic cardiomyopathy) ([place])   . Obesity (BMI 30-39.9) 12/04/2015  . OSA (obstructive sleep apnea) 08/19/2015   Severe with AHI 64/hr now on CPAP at 13cm H2O  . PAF (paroxysmal atrial fibrillation) ([place])   . Systolic CHF, chronic ([place])    Past Surgical History:  Procedure Laterality Date  . APPENDECTOMY    . CARDIAC CATHETERIZATION    . CARDIOVERSION N/A 06/19/2017   Procedure: CARDIOVERSION;  Surgeon: Jolaine Artist, MD;  Location: [facility] ENDOSCOPY;  Service: Cardiovascular;  Laterality: N/A;  . LIPOMA RESECTION     arms  . SPLENECTOMY    . SPLIT NIGHT STUDY  08/12/2015     Current Meds  Medication Sig  . carvedilol (COREG) 25 MG tablet TAKE 1 TABLET (25 MG TOTAL) BY MOUTH 2 (  TWO) TIMES DAILY WITH A MEAL. NEEDS APPT  . DULoxetine (CYMBALTA) 20 MG capsule TAKE 1 CAPSULE BY MOUTH EVERY DAY  . ELIQUIS 5 MG TABS tablet TAKE 1 TABLET BY MOUTH TWICE A DAY  . ENTRESTO 97-103 MG TAKE 1 TABLET BY MOUTH TWICE A DAY  . ibuprofen (ADVIL,MOTRIN) 200 MG tablet Take 800 mg by mouth every 8 (eight) hours as needed for mild pain or moderate pain.   . methocarbamol (ROBAXIN) 500 MG tablet Take 500 mg by mouth 2 (two) times daily as needed for muscle spasms.   . metolazone (ZAROXOLYN) 5 MG tablet TAKE ONLY AS  DIRECTED BY THE HEART FAILURE CLINIC  . potassium chloride SA (KLOR-CON M20) 20 MEQ tablet TAKE 4 TABS IN THE AM, 2 TABS WITH LUNCH AND 4 TABS IN THE PM  . ranolazine (RANEXA) 500 MG 12 hr tablet TAKE 1 TABLET (500 MG TOTAL) BY MOUTH 2 (TWO) TIMES DAILY.  . sildenafil (VIAGRA) 100 MG tablet Take 1 tablet (100 mg total) by mouth daily as needed for erectile dysfunction.  Marland Kitchen spironolactone (ALDACTONE) 50 MG tablet Take 1 tablet (50 mg total) by mouth daily.  Marland Kitchen torsemide (DEMADEX) 20 MG tablet TAKE 2 TABLETS (40 MG TOTAL) BY MOUTH 2 (TWO) TIMES DAILY.     Allergies:   Amiodarone   Social History   Tobacco Use  . Smoking status: Never Smoker  . Smokeless tobacco: Never Used  Substance Use Topics  . Alcohol use: Yes    Comment: occasional  . Drug use: Not on file     Family Hx: The patient's family history includes Cancer in his mother; Heart disease in his unknown relative; Hypertension in his unknown relative.  ROS:   Please see the history of present illness.     All other systems reviewed and are negative.   Labs/Other Tests and Data Reviewed:    Recent Labs: 02/02/2019: B Natriuretic Peptide 17.5; Hemoglobin 17.9; Platelets 294 02/16/2019: BUN 23; Creatinine, Ser 1.14; Potassium 4.2; Sodium 140   Recent Lipid Panel Lab Results  Component Value Date/Time   CHOL 158 02/02/2019 10:45 AM   TRIG 275 (H) 02/02/2019 10:45 AM   HDL 28 (L) 02/02/2019 10:45 AM   CHOLHDL 5.6 02/02/2019 10:45 AM   LDLCALC 75 02/02/2019 10:45 AM    Wt Readings from Last 3 Encounters:  09/07/19 (!) 335 lb (152 kg)  02/02/19 (!) 330 lb 9.6 oz (150 kg)  01/07/18 (!) 331 lb 12.8 oz (150.5 kg)     Objective:    Vital Signs:  Pulse 97   Ht 6\' 5"  (1.956 m)   Wt (!) 335 lb (152 kg)   BMI 39.73 kg/m     ASSESSMENT & PLAN:    1.  OSA -The patient is tolerating PAP therapy well without any problems. The PAP download was reviewed today and showed an AHI of 1/hr on 13 cm H2O with 90% compliance in  using more than 4 hours nightly.  The patient has been using and benefiting from PAP use and will continue to benefit from therapy.   2.  HTN -continue spiro 25mg  daily, Carvedilol 25mg  BID and Entresto 97-103mg  BID  3.  Obesity -I have encouraged her to get into a routine exercise program and cut back on carbs and portions.   COVID-19 Education: The signs and symptoms of COVID-19 were discussed with the patient and how to seek care for testing (follow up with PCP or arrange E-visit).  The importance of social  distancing was discussed today.  Patient Risk:   After full review of this patient's clinical status, I feel that they are at least moderate risk at this time.  Time:   Today, I have spent 20 minutes on telemedicine discussing medical problems including OSA, HTN, obesity and reviewing patient's chart including PAP compliance download.  Medication Adjustments/Labs and Tests Ordered: Current medicines are reviewed at length with the patient today.  Concerns regarding medicines are outlined above.  Tests Ordered: No orders of the defined types were placed in this encounter.  Medication Changes: No orders of the defined types were placed in this encounter.   Disposition:  Follow up in 1 year(s)  Signed, Harold Him, MD  09/07/2019 8:56 AM    [facility]

## 2019-09-16 ENCOUNTER — Other Ambulatory Visit (HOSPITAL_COMMUNITY): Payer: Self-pay | Admitting: Internal Medicine

## 2019-10-19 DIAGNOSIS — H26492 Other secondary cataract, left eye: Secondary | ICD-10-CM | POA: Diagnosis not present

## 2019-10-19 DIAGNOSIS — H5319 Other subjective visual disturbances: Secondary | ICD-10-CM | POA: Diagnosis not present

## 2019-10-31 DIAGNOSIS — H26492 Other secondary cataract, left eye: Secondary | ICD-10-CM | POA: Diagnosis not present

## 2019-11-02 DIAGNOSIS — G4733 Obstructive sleep apnea (adult) (pediatric): Secondary | ICD-10-CM | POA: Diagnosis not present

## 2019-12-09 ENCOUNTER — Other Ambulatory Visit (HOSPITAL_COMMUNITY): Payer: Self-pay | Admitting: Internal Medicine

## 2019-12-24 ENCOUNTER — Other Ambulatory Visit: Payer: Self-pay | Admitting: Cardiovascular Disease

## 2019-12-27 NOTE — Telephone Encounter (Signed)
PCP should refill, but will send to Dr. [name] for advisement.

## 2019-12-27 NOTE — Telephone Encounter (Signed)
Pt's pharmacy is requesting a refill on duloxetine (Cymbalto). Would Dr. [name] like to refill this medication? Please address

## 2020-01-26 ENCOUNTER — Encounter: Payer: Self-pay | Admitting: Internal Medicine

## 2020-01-26 ENCOUNTER — Other Ambulatory Visit: Payer: Self-pay

## 2020-01-26 ENCOUNTER — Ambulatory Visit (INDEPENDENT_AMBULATORY_CARE_PROVIDER_SITE_OTHER)
Admission: RE | Admit: 2020-01-26 | Discharge: 2020-01-26 | Disposition: A | Discharge status: Home / Self Care | Payer: BC Managed Care – PPO | Source: Ambulatory Visit | Attending: Internal Medicine | Admitting: Internal Medicine

## 2020-01-26 ENCOUNTER — Telehealth (INDEPENDENT_AMBULATORY_CARE_PROVIDER_SITE_OTHER): Payer: BC Managed Care – PPO | Admitting: Internal Medicine

## 2020-01-26 ENCOUNTER — Telehealth (HOSPITAL_COMMUNITY): Payer: Self-pay | Admitting: *Deleted

## 2020-01-26 ENCOUNTER — Ambulatory Visit: Payer: BC Managed Care – PPO

## 2020-01-26 DIAGNOSIS — I5022 Chronic systolic (congestive) heart failure: Secondary | ICD-10-CM | POA: Diagnosis not present

## 2020-01-26 DIAGNOSIS — I517 Cardiomegaly: Secondary | ICD-10-CM | POA: Diagnosis not present

## 2020-01-26 DIAGNOSIS — R0789 Other chest pain: Secondary | ICD-10-CM

## 2020-01-26 DIAGNOSIS — R053 Chronic cough: Secondary | ICD-10-CM | POA: Diagnosis not present

## 2020-01-26 DIAGNOSIS — I77810 Thoracic aortic ectasia: Secondary | ICD-10-CM | POA: Diagnosis not present

## 2020-01-26 DIAGNOSIS — R079 Chest pain, unspecified: Secondary | ICD-10-CM | POA: Diagnosis not present

## 2020-01-26 DIAGNOSIS — R059 Cough, unspecified: Secondary | ICD-10-CM | POA: Diagnosis not present

## 2020-01-26 NOTE — Progress Notes (Signed)
Virtual Visit via Video Note  I connected with@ on 01/26/20 at 10:30 AM EST by a video enabled telemedicine application and verified that I am speaking with the correct person using two identifiers. Location patient: home Location provide home office Persons participating in the virtual visit: patient, provider  [place]th national recommendations  regarding COVID 19 pandemic   video visit is advised over in office visit for this patient.  Patient aware  of the limitations of evaluation and management by telemedicine and  availability of in person appointments. and agreed to proceed.   HPI: Harold Park presents for video visit  PCP dr burchette appt NA He is a patient in the chronic heart failure clinic and history of PAF mildly increased aortic root who is been stable but comes her presents today because this morning he had a 10-minute or less episode of right parasternal anterior chest pain that he has had in the past intermittently but has not lasted this long.  It was alarming to him and thought he ought to check it out he did get a sweat but no associated shortness of breath nausea vomiting.  He states that felt like a cramp and he raised his arms to see if he can relieve it.  He tends to get cramps when his potassium is not in range.  He has a follow-up appointment with Dr. Nani Ravens in the heart failure clinic in the next 4 to 5 days.  He has also had an atypical cough that comes and goes but he cannot get anything up feels like something could be caught in his chest but no shortness of breath fever or systemic illness related to this.  No URI. No past history of significant tobacco use lung disease.  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 He has been immunized from Covid no known exposures.    ROS: See pertinent positives and negatives per HPI. No fever  Change exercise toleracne new meds   Current palpitations   Edema  On cpap  No new ur  congestion  Past Medical History:  Diagnosis Date  . Chronic bronchitis   . HTN (hypertension)   . NICM (nonischemic cardiomyopathy) ([place])   . Obesity (BMI 30-39.9) 12/04/2015  . OSA (obstructive sleep apnea) 08/19/2015   Severe with AHI 64/hr now on CPAP at 13cm H2O  . PAF (paroxysmal atrial fibrillation) ([place])   . Systolic CHF, chronic ([place])     Past Surgical History:  Procedure Laterality Date  . APPENDECTOMY    . CARDIAC CATHETERIZATION    . CARDIOVERSION N/A 06/19/2017   Procedure: CARDIOVERSION;  Surgeon: Jolaine Artist, MD;  Location: [facility] ENDOSCOPY;  Service: Cardiovascular;  Laterality: N/A;  . LIPOMA RESECTION     arms  . SPLENECTOMY    . SPLIT NIGHT STUDY  08/12/2015    Family History  Problem Relation Age of Onset  . Cancer Mother        breast  . Hypertension Unknown   . Heart disease Unknown     Social History   Tobacco Use  . Smoking status: Never Smoker  . Smokeless tobacco: Never Used  Substance Use Topics  . Alcohol use: Yes    Comment: occasional  . Drug use: Not on file      Current Outpatient Medications:  .  carvedilol (COREG) 25 MG tablet, TAKE 1 TABLET (25 MG TOTAL) BY MOUTH 2 (TWO) TIMES DAILY WITH A MEAL. NEEDS APPT, Disp: 180  tablet, Rfl: 0 .  DULoxetine (CYMBALTA) 20 MG capsule, TAKE 1 CAPSULE BY MOUTH EVERY DAY, Disp: 90 capsule, Rfl: 3 .  ELIQUIS 5 MG TABS tablet, TAKE 1 TABLET BY MOUTH TWICE A DAY, Disp: 180 tablet, Rfl: 3 .  ENTRESTO 97-103 MG, TAKE 1 TABLET BY MOUTH TWICE A DAY, Disp: 180 tablet, Rfl: 2 .  ibuprofen (ADVIL,MOTRIN) 200 MG tablet, Take 800 mg by mouth every 8 (eight) hours as needed for mild pain or moderate pain. , Disp: , Rfl:  .  methocarbamol (ROBAXIN) 500 MG tablet, Take 500 mg by mouth 2 (two) times daily as needed for muscle spasms. , Disp: , Rfl:  .  metolazone (ZAROXOLYN) 5 MG tablet, TAKE ONLY AS DIRECTED BY THE HEART FAILURE CLINIC, Disp: 12 tablet, Rfl: 0 .  potassium chloride SA (KLOR-CON M20) 20 MEQ  tablet, TAKE 4 TABS IN THE AM, 2 TABS WITH LUNCH AND 4 TABS IN THE PM, Disp: 300 tablet, Rfl: 2 .  ranolazine (RANEXA) 500 MG 12 hr tablet, TAKE 1 TABLET (500 MG TOTAL) BY MOUTH 2 (TWO) TIMES DAILY., Disp: 180 tablet, Rfl: 3 .  sildenafil (VIAGRA) 100 MG tablet, Take 1 tablet (100 mg total) by mouth daily as needed for erectile dysfunction., Disp: 10 tablet, Rfl: 1 .  spironolactone (ALDACTONE) 50 MG tablet, Take 1 tablet (50 mg total) by mouth daily., Disp: 90 tablet, Rfl: 3 .  torsemide (DEMADEX) 20 MG tablet, TAKE 2 TABLETS (40 MG TOTAL) BY MOUTH 2 (TWO) TIMES DAILY., Disp: 360 tablet, Rfl: 1  EXAM: BP Readings from Last 3 Encounters:  02/02/19 97/69  01/07/18 116/80  11/20/17 99/64    VITALS per patient if applicable:  GENERAL: alert, oriented, appears well and in no acute distress  HEENT: atraumatic, conjunttiva clear, no obvious abnormalities on inspection of external nose and ears NECK: normal movements of the head and neck LUNGS: on inspection no signs of respiratory distress, breathing rate appears normal, no obvious gross SOB, gasping or wheezing CV: no obvious cyanosis MS: moves all visible extremities without noticeable abnormality PSYCH/NEURO: pleasant and cooperative, no obvious depression or anxiety, speech and thought processing grossly intact Lab Results  Component Value Date   WBC 11.9 (H) 02/02/2019   HGB 17.9 (H) 02/02/2019   HCT 53.9 (H) 02/02/2019   PLT 294 02/02/2019   GLUCOSE 179 (H) 02/16/2019   CHOL 158 02/02/2019   TRIG 275 (H) 02/02/2019   HDL 28 (L) 02/02/2019   LDLCALC 75 02/02/2019   ALT 34 01/23/2010   AST 35 01/23/2010   NA 140 02/16/2019   K 4.2 02/16/2019   CL 107 02/16/2019   CREATININE 1.14 02/16/2019   BUN 23 (H) 02/16/2019   CO2 20 (L) 02/16/2019   TSH 2.87 04/08/2010   INR 1.18 06/17/2017   HGBA1C 5.8 (H) 02/02/2019    ASSESSMENT AND PLAN:  Discussed the following assessment and plan:    ICD-10-CM   1. Intermittent  right-sided chest pain  R07.89 DG Chest 2 View  2. Persistent dry cough  R05.3 DG Chest 2 View  3. Chronic systolic heart failure (HCC)  I50.22 DG Chest 2 View   NICM  4. Dilated aortic root (HCC)  I77.810    on problem list    He looks well today uncertain diagnosis plan chest x-ray today in the office order given Get Covid tested even though not classic symptom. If gets recurrent pain that severe reach out to medical team or emergent care. This could be  chest wall although with his underlying symptoms and risk need further evaluation and follow-up. Review of record had CT angio in 2019 aortic root was 4.6 cm. No aneurysm  Counseled.   Expectant management and discussion of plan and treatment with opportunity to ask questions and all were answered. The patient agreed with the plan and demonstrated an understanding of the instructions.   Advised to call back or seek an in-person evaluation if worsening  or having  further concerns . Return for as planned   cardiology  or  if worse to reach out ot medical team .  Ed care  For alarm sx    Shanon Ace, MD

## 2020-01-26 NOTE — Telephone Encounter (Signed)
Pt left VM requesting an asap appt with Dr.Bensimhon stated his apple watch showed he was in afib on 11/4 he took medicine and it went away. Pt c/o of new right sided chest pain and a cough. Called back to get more information no answer/left vm for return call. Unfortunately Dr.Bensimhon's next available appt is not until January.

## 2020-01-27 NOTE — Progress Notes (Signed)
So  prominent marking on the right  But cant tell if  blood vessel or lymph node  Some   mild increase markings that could   be seen in heart failure conditions  but not sure . Ct scan of chest   can get a better look . I will send info to dr Haroldine Laws and edr [name] about ordering this test imaging ( or other as they see fit. )  .

## 2020-01-29 ENCOUNTER — Other Ambulatory Visit: Payer: Self-pay | Admitting: Family Medicine

## 2020-01-29 DIAGNOSIS — R9389 Abnormal findings on diagnostic imaging of other specified body structures: Secondary | ICD-10-CM

## 2020-01-29 NOTE — Progress Notes (Signed)
Patient ID: Oskar Cretella, male   DOB: March 22, 1965, 54 y.o.   MRN: [id number]    Advanced Heart Failure Clinic Note   Primary Care: Eulas Post, MD Primary Cardiologist: Josue Hector, MD HF MD: Dr Haroldine Laws  HPI: Edin Kon is a 54 y.o. male with NICM with EF 84-66%, chronic systolic CHF, parox A-Flutter with RVR (01/2010) and HTN. He had a cath in 01/2010 demonstrating normal cors, EF 25-30% and moderate pulmonary HTN. Echo done in 01/2010 demonstrated EF 30-35%, mild MR, mild LAE, mild RVE.  Found to be in AF 4/17. Started on eliquis, coreg increased, and TEE/DCCV scheduled for 06/29/15. Was in NSR on his arrival by EKG so this was cancelled.   We saw him for the first time in May 2017. At that time we switched losartan to [facility] and referred to Juan Quam and had + sleep study with severe OSA 64/hr. Also saw Dr. Caryl Comes in EP. Holter monitor placed to quantify PVCs---> - 6.3%. Unable to tolerate amio in past due to severe nausea. Remains on Ranexa 500 bid to try to suppress PVCs.   Underwent DC-CV on 06/19/17 for recurrent AF Seen in AF Clinic on 07/02/17. Felt not to be ideal candidate for AF ablation at this time but willing to consider with some lifestyle changes/weight loss.   CT angio in 2019 aortic root was 4.6 cm  Recently seen by IM for televisit c/o right cramp-like chest pain and cough. Had CXR and there was mild edema and a question of right hilar fullness. CT chest pending  He presents today to f/u on recent findings. Says he gained 25-30 pounds over past few months. Recently increased torsemide to 40 bid and began to feel rundown. Now back down to 20 bid and feeling better. Last Thursday had severe cramp like pain under R breast which lasted 10-15 mins. Broke out into a sweat. Pain resolved. Has not recurred. Two weeks ago had run of AF on his Apple Watch and resolved.  Gets around pretty good without CP. Has dry cough.   Echo 12/20 EF 35-40%. RV ok. No TR. AoRoot 4.1  cm.   ECHO  01/2017 EF 35% Grade IDD RV normal. 12/11/2015 EF 35-40%    12/04/14 LVEF 30-35%, trivial AI, Mod LAE Echo 9/19 EF 35-40%. RV ok.   Social: Works in Personal assistant, Lives in [place]. 3 kids  Past Medical History:  Diagnosis Date  . Chronic bronchitis   . HTN (hypertension)   . NICM (nonischemic cardiomyopathy) ([place])   . Obesity (BMI 30-39.9) 12/04/2015  . OSA (obstructive sleep apnea) 08/19/2015   Severe with AHI 64/hr now on CPAP at 13cm H2O  . PAF (paroxysmal atrial fibrillation) ([place])   . Systolic CHF, chronic (HCC)     Current Outpatient Medications  Medication Sig Dispense Refill  . carvedilol (COREG) 25 MG tablet TAKE 1 TABLET (25 MG TOTAL) BY MOUTH 2 (TWO) TIMES DAILY WITH A MEAL. NEEDS APPT 180 tablet 0  . DULoxetine (CYMBALTA) 20 MG capsule TAKE 1 CAPSULE BY MOUTH EVERY DAY 90 capsule 3  . ELIQUIS 5 MG TABS tablet TAKE 1 TABLET BY MOUTH TWICE A DAY 180 tablet 3  . ENTRESTO 97-103 MG TAKE 1 TABLET BY MOUTH TWICE A DAY 180 tablet 2  . ibuprofen (ADVIL,MOTRIN) 200 MG tablet Take 800 mg by mouth every 8 (eight) hours as needed for mild pain or moderate pain.     . methocarbamol (ROBAXIN) 500 MG tablet Take 500 mg by  mouth 2 (two) times daily as needed for muscle spasms.     . metolazone (ZAROXOLYN) 5 MG tablet TAKE ONLY AS DIRECTED BY THE HEART FAILURE CLINIC 12 tablet 0  . potassium chloride SA (KLOR-CON M20) 20 MEQ tablet TAKE 4 TABS IN THE AM, 2 TABS WITH LUNCH AND 4 TABS IN THE PM 300 tablet 2  . ranolazine (RANEXA) 500 MG 12 hr tablet TAKE 1 TABLET (500 MG TOTAL) BY MOUTH 2 (TWO) TIMES DAILY. 180 tablet 3  . sildenafil (VIAGRA) 100 MG tablet Take 1 tablet (100 mg total) by mouth daily as needed for erectile dysfunction. 10 tablet 1  . spironolactone (ALDACTONE) 50 MG tablet Take 1 tablet (50 mg total) by mouth daily. 90 tablet 3  . torsemide (DEMADEX) 20 MG tablet TAKE 2 TABLETS (40 MG TOTAL) BY MOUTH 2 (TWO) TIMES DAILY. 360 tablet 1   No current  facility-administered medications for this encounter.    Allergies  Allergen Reactions  . Amiodarone Nausea Only    Social History   Socioeconomic History  . Marital status: Married    Spouse name: Not on file  . Number of children: 3  . Years of education: Not on file  . Highest education level: Not on file  Occupational History  . Occupation: realtor  Tobacco Use  . Smoking status: Never Smoker  . Smokeless tobacco: Never Used  Substance and Sexual Activity  . Alcohol use: Yes    Comment: occasional  . Drug use: Not on file  . Sexual activity: Not on file  Other Topics Concern  . Not on file  Social History Narrative  . Not on file   Social Determinants of Health   Financial Resource Strain:   . Difficulty of Paying Living Expenses: Not on file  Food Insecurity:   . Worried About Charity fundraiser in the Last Year: Not on file  . Ran Out of Food in the Last Year: Not on file  Transportation Needs:   . Lack of Transportation (Medical): Not on file  . Lack of Transportation (Non-Medical): Not on file  Physical Activity:   . Days of Exercise per Week: Not on file  . Minutes of Exercise per Session: Not on file  Stress:   . Feeling of Stress : Not on file  Social Connections:   . Frequency of Communication with Friends and Family: Not on file  . Frequency of Social Gatherings with Friends and Family: Not on file  . Attends Religious Services: Not on file  . Active Member of Clubs or Organizations: Not on file  . Attends Archivist Meetings: Not on file  . Marital Status: Not on file  Intimate Partner Violence:   . Fear of Current or Ex-Partner: Not on file  . Emotionally Abused: Not on file  . Physically Abused: Not on file  . Sexually Abused: Not on file    Family History  Problem Relation Age of Onset  . Cancer Mother        breast  . Hypertension Unknown   . Heart disease Unknown    Vitals:   01/30/20 1516  BP: 140/88  Pulse: 98   SpO2: 95%  Weight: (!) 157.9 kg (348 lb)    Wt Readings from Last 3 Encounters:  01/30/20 (!) 157.9 kg (348 lb)  09/07/19 (!) 152 kg (335 lb)  02/02/19 (!) 150 kg (330 lb 9.6 oz)     PHYSICAL EXAM: General:  Well appearing. No resp  difficulty HEENT: normal Neck: supple. no JVD. Carotids 2+ bilat; no bruits. No lymphadenopathy or thryomegaly appreciated. Cor: PMI nondisplaced. Regular rate & rhythm. No rubs, gallops or murmurs. Lungs: clear Abdomen: obese soft, nontender, nondistended. No hepatosplenomegaly. No bruits or masses. Good bowel sounds. Extremities: no cyanosis, clubbing, rash, edema Neuro: alert & orientedx3, cranial nerves grossly intact. moves all 4 extremities w/o difficulty. Affect pleasant   ECG: NSR 100 LAFB No ST-T wave abnormalities. Personally reviewed   ASSESSMENT & PLAN:  1. Chronic systolic HF - NICM  - Cardiac CT 11/10. EF 64% no CAD - cath in 01/2010 demonstrating normal cors. EF 25-30% - ECHO 01/2017 EF 35%. - Echo 9/19 EF stable at 35-40%.  - Echo 12/20 EF 35-40%. RV ok. No TR. AoRoot 4.1 cm.  - Stable NYHA II - Volume status looks ok. May be a bit dry.  - Continue torsemide 20 bid. - Continue carvedilol 25 mg twice a day.  - Continue entresto 97-103 mg twice a day.  - Continue spiro 50 mg daily. - Check labs. Repeat echo.   2. Chest pain - atypical features but associated with acute diaphoresis and he has multiple RFs for CD - CXR shows ? Hilar fullness. CT pending -> will get CT coronaries and chest to evaluate both fully   3. PAF - s/p DC-CV 06/19/17 - has been seen in AF Clinic. Felt that he needs to lose weight prior to considering AF ablation. He is working on weight loss.  - had run of AF on Apple Watch 2 weeks ago which lasted about an hour and broke spontaneously - Remains in NSR on ECG today - Continue Eliquis. No bleeding currently  - Intolerant amiodarone---> nausea/vomiting. Remains on Ranexa.   4. HTN - BP stable  5.  Morbid Obesity  - Encouraged weight loss  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 4.3 cm previously - CT chest 11/19 Ao Root 4.6cm. Remainder of aorta ok  - Pending repeat CT  7. Severe OSA (64/hr)  - Followed by Dr. Radford Pax. Good control   7. Frequent PVCs - Stable. No PVCs on ECG today - Continue Ranexa    Glori Bickers, MD  10:54 PM

## 2020-01-29 NOTE — Progress Notes (Signed)
Prominent right hila noted on recent CXR.   Recommendation per radiology to consider CT chest and I will set up. Make sure pt aware we are setting up CT chest.

## 2020-01-30 ENCOUNTER — Other Ambulatory Visit: Payer: Self-pay

## 2020-01-30 ENCOUNTER — Ambulatory Visit (HOSPITAL_COMMUNITY)
Admission: RE | Admit: 2020-01-30 | Discharge: 2020-01-30 | Disposition: A | Discharge status: Home / Self Care | Payer: BC Managed Care – PPO | Source: Ambulatory Visit | Attending: Internal Medicine | Admitting: Internal Medicine

## 2020-01-30 ENCOUNTER — Encounter (HOSPITAL_COMMUNITY): Payer: Self-pay | Admitting: Internal Medicine

## 2020-01-30 VITALS — BP 140/88 | HR 98 | Wt 348.0 lb

## 2020-01-30 DIAGNOSIS — I480 Paroxysmal atrial fibrillation: Secondary | ICD-10-CM | POA: Diagnosis not present

## 2020-01-30 DIAGNOSIS — Z6841 Body Mass Index (BMI) 40.0 and over, adult: Secondary | ICD-10-CM | POA: Diagnosis not present

## 2020-01-30 DIAGNOSIS — I110 Hypertensive heart disease with heart failure: Secondary | ICD-10-CM | POA: Diagnosis not present

## 2020-01-30 DIAGNOSIS — Q2543 Congenital aneurysm of aorta: Secondary | ICD-10-CM

## 2020-01-30 DIAGNOSIS — I7121 Aneurysm of the ascending aorta, without rupture: Secondary | ICD-10-CM

## 2020-01-30 DIAGNOSIS — Z79899 Other long term (current) drug therapy: Secondary | ICD-10-CM | POA: Diagnosis not present

## 2020-01-30 DIAGNOSIS — R0789 Other chest pain: Secondary | ICD-10-CM | POA: Insufficient documentation

## 2020-01-30 DIAGNOSIS — Z7901 Long term (current) use of anticoagulants: Secondary | ICD-10-CM | POA: Insufficient documentation

## 2020-01-30 DIAGNOSIS — Z9989 Dependence on other enabling machines and devices: Secondary | ICD-10-CM | POA: Insufficient documentation

## 2020-01-30 DIAGNOSIS — G4733 Obstructive sleep apnea (adult) (pediatric): Secondary | ICD-10-CM | POA: Diagnosis not present

## 2020-01-30 DIAGNOSIS — I493 Ventricular premature depolarization: Secondary | ICD-10-CM | POA: Diagnosis not present

## 2020-01-30 DIAGNOSIS — I428 Other cardiomyopathies: Secondary | ICD-10-CM | POA: Diagnosis not present

## 2020-01-30 DIAGNOSIS — I10 Essential (primary) hypertension: Secondary | ICD-10-CM

## 2020-01-30 DIAGNOSIS — I5022 Chronic systolic (congestive) heart failure: Secondary | ICD-10-CM

## 2020-01-30 DIAGNOSIS — R61 Generalized hyperhidrosis: Secondary | ICD-10-CM | POA: Diagnosis not present

## 2020-01-30 DIAGNOSIS — I719 Aortic aneurysm of unspecified site, without rupture: Secondary | ICD-10-CM

## 2020-01-30 DIAGNOSIS — R079 Chest pain, unspecified: Secondary | ICD-10-CM | POA: Diagnosis not present

## 2020-01-30 DIAGNOSIS — E669 Obesity, unspecified: Secondary | ICD-10-CM

## 2020-01-30 DIAGNOSIS — I272 Pulmonary hypertension, unspecified: Secondary | ICD-10-CM | POA: Insufficient documentation

## 2020-01-30 LAB — COMPREHENSIVE METABOLIC PANEL
ALT: 31 U/L (ref 0–44)
AST: 25 U/L (ref 15–41)
Albumin: 3.7 g/dL (ref 3.5–5.0)
Alkaline Phosphatase: 62 U/L (ref 38–126)
Anion gap: 9 (ref 5–15)
BUN: 21 mg/dL — ABNORMAL HIGH (ref 6–20)
CO2: 24 mmol/L (ref 22–32)
Calcium: 9.9 mg/dL (ref 8.9–10.3)
Chloride: 106 mmol/L (ref 98–111)
Creatinine, Ser: 1.33 mg/dL — ABNORMAL HIGH (ref 0.61–1.24)
GFR, Estimated: >60 mL/min (ref >60)
Glucose, Bld: 113 mg/dL — ABNORMAL HIGH (ref 70–99)
Potassium: 4.5 mmol/L (ref 3.5–5.1)
Sodium: 139 mmol/L (ref 135–145)
Total Bilirubin: 0.8 mg/dL (ref 0.3–1.2)
Total Protein: 6.9 g/dL (ref 6.5–8.1)

## 2020-01-30 LAB — CBC
HCT: 53.2 % — ABNORMAL HIGH (ref 39.0–52.0)
Hemoglobin: 17.5 g/dL — ABNORMAL HIGH (ref 13.0–17.0)
MCH: 29.5 pg (ref 26.0–34.0)
MCHC: 32.9 g/dL (ref 30.0–36.0)
MCV: 89.6 fL (ref 80.0–100.0)
Platelets: 303 10*3/uL (ref 150–400)
RBC: 5.94 MIL/uL — ABNORMAL HIGH (ref 4.22–5.81)
RDW: 13.2 % (ref 11.5–15.5)
WBC: 11.9 10*3/uL — ABNORMAL HIGH (ref 4.0–10.5)
nRBC: 0 % (ref 0.0–0.2)

## 2020-01-30 LAB — BRAIN NATRIURETIC PEPTIDE: B Natriuretic Peptide: 26.6 pg/mL (ref 0.0–100.0)

## 2020-01-30 LAB — TSH: TSH: 3.247 u[IU]/mL (ref 0.350–4.500)

## 2020-01-30 MED ORDER — DIAZEPAM 5 MG PO TABS: 5.0000 mg | ORAL_TABLET | Freq: Once | ORAL | 0 refills | Status: AC

## 2020-01-30 NOTE — Addendum Note (Signed)
Encounter addended by: Jolaine Artist, MD on: 01/30/2020 4:32 PM  Actions taken: Level of Service modified, Visit diagnoses modified

## 2020-01-30 NOTE — Patient Instructions (Signed)
Labs done today, your results will be available in MyChart, we will contact you for abnormal readings.  Your physician has requested that you have an echocardiogram. Echocardiography is a painless test that uses sound waves to create images of your heart. It provides your doctor with information about the size and shape of your heart and how well your hearts chambers and valves are working. This procedure takes approximately one hour. There are no restrictions for this procedure.  Your physician has requested that you have cardiac CT. Cardiac computed tomography (CT) is a painless test that uses an x-ray machine to take clear, detailed pictures of your heart. For further information please visit [email or web address]. Please follow instruction sheet as given. ONCE THIS HAS BEEN APPROVED BY YOUR INSURANCE COMPANY YOU WILL BE CONTACTED TO SCHEDULE THIS. WE HAVE GIVEN YOU A PRESCRIPTION FOR VALIUM TO TAKE 1 HOUR PRIOR TO THIS APPOINTMENT  Your physician recommends that you schedule a follow-up appointment in: 3 months  If you have any questions or concerns before your next appointment please send [place] a message through [place] or call our office at [phone].    TO LEAVE A MESSAGE FOR THE NURSE SELECT OPTION 2, PLEASE LEAVE A MESSAGE INCLUDING:  YOUR NAME  DATE OF BIRTH  CALL BACK NUMBER  REASON FOR CALL**this is important as we prioritize the call backs  YOU WILL RECEIVE A CALL BACK THE SAME DAY AS LONG AS YOU CALL BEFORE 4:00 PM  At the [facility], you and your health needs are our priority. As part of our continuing mission to provide you with exceptional heart care, we have created designated Provider Care Teams. These Care Teams include your primary Cardiologist (physician) and Advanced Practice Providers (APPs- Physician Assistants and Nurse Practitioners) who all work together to provide you with the care you need, when you need it.   You may see any of the following  providers on your designated Care Team at your next follow up:  Dr Glori Bickers  Dr Haynes Kerns, NP  Lyda Jester, [place]  Audry Riles, PharmD   Please be sure to bring in all your medications bottles to every appointment.

## 2020-01-30 NOTE — Progress Notes (Signed)
Patient is aware.  Patient has an appointment with Dr Haroldine Laws 01/30/20, and will discuss the results and options with him.  Patient will call back as needed.

## 2020-01-30 NOTE — Addendum Note (Signed)
Encounter addended by: Scarlette Calico, RN on: 01/30/2020 4:41 PM  Actions taken: Order list changed

## 2020-01-31 DIAGNOSIS — G4733 Obstructive sleep apnea (adult) (pediatric): Secondary | ICD-10-CM | POA: Diagnosis not present

## 2020-02-10 ENCOUNTER — Ambulatory Visit (HOSPITAL_COMMUNITY)
Admission: RE | Admit: 2020-02-10 | Discharge: 2020-02-10 | Disposition: A | Discharge status: Home / Self Care | Payer: BC Managed Care – PPO | Source: Ambulatory Visit | Attending: Internal Medicine | Admitting: Internal Medicine

## 2020-02-10 ENCOUNTER — Other Ambulatory Visit: Payer: Self-pay

## 2020-02-10 DIAGNOSIS — I351 Nonrheumatic aortic (valve) insufficiency: Secondary | ICD-10-CM | POA: Diagnosis not present

## 2020-02-10 DIAGNOSIS — I5022 Chronic systolic (congestive) heart failure: Secondary | ICD-10-CM | POA: Insufficient documentation

## 2020-02-10 LAB — ECHOCARDIOGRAM COMPLETE: S' Lateral: 4.3 cm

## 2020-02-10 MED ORDER — PERFLUTREN LIPID MICROSPHERE
1.0000 mL | INTRAVENOUS | Status: AC | PRN
  Administered 2020-02-10: 5 mL via INTRAVENOUS
  Filled 2020-02-10: qty 10

## 2020-02-10 NOTE — Progress Notes (Signed)
  Echocardiogram 2D Echocardiogram has been performed.  [name] 02/10/2020, 11:10 AM

## 2020-02-23 ENCOUNTER — Other Ambulatory Visit (HOSPITAL_COMMUNITY): Payer: Self-pay | Admitting: Internal Medicine

## 2020-02-23 ENCOUNTER — Encounter (HOSPITAL_COMMUNITY): Payer: BC Managed Care – PPO

## 2020-02-24 ENCOUNTER — Other Ambulatory Visit (HOSPITAL_COMMUNITY): Payer: Self-pay | Admitting: *Deleted

## 2020-02-24 MED ORDER — APIXABAN 5 MG PO TABS: 5.0000 mg | ORAL_TABLET | Freq: Two times a day (BID) | ORAL | 3 refills | Status: DC

## 2020-02-28 ENCOUNTER — Other Ambulatory Visit (HOSPITAL_COMMUNITY): Payer: Self-pay | Admitting: Emergency Medicine

## 2020-02-28 DIAGNOSIS — I712 Thoracic aortic aneurysm, without rupture, unspecified: Secondary | ICD-10-CM

## 2020-02-28 NOTE — Progress Notes (Signed)
CT angio aorta ordered for known aortic root dissection follow up

## 2020-02-29 ENCOUNTER — Telehealth (HOSPITAL_COMMUNITY): Payer: Self-pay | Admitting: *Deleted

## 2020-02-29 NOTE — Telephone Encounter (Signed)
Attempted to call patient regarding upcoming cardiac CT appointment. Left message on voicemail with name and callback number   Tai RN Navigator Cardiac Imaging [facility] [phone] Office [phone] Cell  

## 2020-03-05 ENCOUNTER — Ambulatory Visit (HOSPITAL_COMMUNITY)
Admission: RE | Admit: 2020-03-05 | Discharge: 2020-03-05 | Disposition: A | Discharge status: Home / Self Care | Payer: BC Managed Care – PPO | Source: Ambulatory Visit | Attending: Internal Medicine | Admitting: Internal Medicine

## 2020-03-06 ENCOUNTER — Ambulatory Visit (HOSPITAL_COMMUNITY)
Admission: RE | Admit: 2020-03-06 | Discharge: 2020-03-06 | Disposition: A | Discharge status: Home / Self Care | Payer: BC Managed Care – PPO | Source: Ambulatory Visit | Attending: Internal Medicine | Admitting: Internal Medicine

## 2020-03-06 ENCOUNTER — Other Ambulatory Visit: Payer: Self-pay

## 2020-03-06 DIAGNOSIS — R079 Chest pain, unspecified: Secondary | ICD-10-CM | POA: Diagnosis not present

## 2020-03-06 DIAGNOSIS — I712 Thoracic aortic aneurysm, without rupture, unspecified: Secondary | ICD-10-CM

## 2020-03-06 DIAGNOSIS — R590 Localized enlarged lymph nodes: Secondary | ICD-10-CM | POA: Diagnosis not present

## 2020-03-06 DIAGNOSIS — Z7689 Persons encountering health services in other specified circumstances: Secondary | ICD-10-CM | POA: Diagnosis not present

## 2020-03-06 MED ORDER — NITROGLYCERIN 0.4 MG SL SUBL
SUBLINGUAL_TABLET | SUBLINGUAL | Status: AC
  Filled 2020-03-06: qty 2

## 2020-03-06 MED ORDER — METOPROLOL TARTRATE 5 MG/5ML IV SOLN
5.0000 mg | INTRAVENOUS | Status: DC | PRN
  Administered 2020-03-06: 14:00:00 5 mg via INTRAVENOUS

## 2020-03-06 MED ORDER — METOPROLOL TARTRATE 5 MG/5ML IV SOLN
INTRAVENOUS | Status: AC
  Filled 2020-03-06: qty 10

## 2020-03-06 MED ORDER — NITROGLYCERIN 0.4 MG SL SUBL
0.8000 mg | SUBLINGUAL_TABLET | Freq: Once | SUBLINGUAL | Status: AC
  Administered 2020-03-06: 14:00:00 0.8 mg via SUBLINGUAL

## 2020-03-06 MED ORDER — IOHEXOL 350 MG/ML SOLN
100.0000 mL | Freq: Once | INTRAVENOUS | Status: AC | PRN
  Administered 2020-03-06: 14:00:00 100 mL via INTRAVENOUS

## 2020-03-14 ENCOUNTER — Telehealth (HOSPITAL_COMMUNITY): Payer: Self-pay

## 2020-03-14 DIAGNOSIS — R911 Solitary pulmonary nodule: Secondary | ICD-10-CM

## 2020-03-14 NOTE — Telephone Encounter (Signed)
-----   Message from Dolores Patty, MD sent at 03/08/2020  9:48 PM EST ----- Please refer to Pulmonary (Dr. Delton Coombes)

## 2020-03-14 NOTE — Telephone Encounter (Signed)
Patient aware.

## 2020-03-14 NOTE — Telephone Encounter (Signed)
Samara Snide, RN  03/14/2020 11:33 AM EST Back to Top     Patient advised and verbalized understanding. Referral placed   Theresia Bough, [place]  03/12/2020 1:37 PM EST      [phone] (M) LMOM

## 2020-03-14 NOTE — Telephone Encounter (Signed)
-----   Message from Dolores Patty, MD sent at 03/08/2020  9:49 PM EST ----- No cad

## 2020-04-02 ENCOUNTER — Institutional Professional Consult (permissible substitution): Payer: BC Managed Care – PPO | Admitting: Emergency Medicine

## 2020-04-09 ENCOUNTER — Ambulatory Visit (INDEPENDENT_AMBULATORY_CARE_PROVIDER_SITE_OTHER): Payer: BC Managed Care – PPO | Admitting: Emergency Medicine

## 2020-04-09 ENCOUNTER — Telehealth: Payer: Self-pay | Admitting: Emergency Medicine

## 2020-04-09 ENCOUNTER — Encounter: Payer: Self-pay | Admitting: Emergency Medicine

## 2020-04-09 ENCOUNTER — Other Ambulatory Visit: Payer: Self-pay

## 2020-04-09 VITALS — BP 118/72 | HR 104 | Temp 98.4°F | Ht 77.0 in | Wt 345.6 lb

## 2020-04-09 DIAGNOSIS — R590 Localized enlarged lymph nodes: Secondary | ICD-10-CM | POA: Diagnosis not present

## 2020-04-09 DIAGNOSIS — D869 Sarcoidosis, unspecified: Secondary | ICD-10-CM | POA: Insufficient documentation

## 2020-04-09 LAB — CBC WITH DIFFERENTIAL/PLATELET
Basophils Absolute: 0.1 10*3/uL (ref 0.0–0.1)
Basophils Relative: 1 % (ref 0.0–3.0)
Eosinophils Absolute: 0.8 10*3/uL — ABNORMAL HIGH (ref 0.0–0.7)
Eosinophils Relative: 6.5 % — ABNORMAL HIGH (ref 0.0–5.0)
HCT: 52.5 % — ABNORMAL HIGH (ref 39.0–52.0)
Hemoglobin: 17.5 g/dL — ABNORMAL HIGH (ref 13.0–17.0)
Lymphocytes Relative: 21.4 % (ref 12.0–46.0)
Lymphs Abs: 2.5 10*3/uL (ref 0.7–4.0)
MCHC: 33.4 g/dL (ref 30.0–36.0)
MCV: 88 fl (ref 78.0–100.0)
Monocytes Absolute: 1.6 10*3/uL — ABNORMAL HIGH (ref 0.1–1.0)
Monocytes Relative: 13.3 % — ABNORMAL HIGH (ref 3.0–12.0)
Neutro Abs: 6.8 10*3/uL (ref 1.4–7.7)
Neutrophils Relative %: 57.8 % (ref 43.0–77.0)
Platelets: 265 10*3/uL (ref 150.0–400.0)
RBC: 5.96 Mil/uL — ABNORMAL HIGH (ref 4.22–5.81)
RDW: 13.7 % (ref 11.5–15.5)
WBC: 11.7 10*3/uL — ABNORMAL HIGH (ref 4.0–10.5)

## 2020-04-09 LAB — COMPREHENSIVE METABOLIC PANEL
ALT: 21 U/L (ref 0–53)
AST: 17 U/L (ref 0–37)
Albumin: 4.1 g/dL (ref 3.5–5.2)
Alkaline Phosphatase: 62 U/L (ref 39–117)
BUN: 18 mg/dL (ref 6–23)
CO2: 25 mEq/L (ref 19–32)
Calcium: 9.7 mg/dL (ref 8.4–10.5)
Chloride: 107 mEq/L (ref 96–112)
Creatinine, Ser: 1.13 mg/dL (ref 0.40–1.50)
GFR: 72.98 mL/min (ref >60.00)
Glucose, Bld: 86 mg/dL (ref 70–99)
Potassium: 4.2 mEq/L (ref 3.5–5.1)
Sodium: 139 mEq/L (ref 135–145)
Total Bilirubin: 0.5 mg/dL (ref 0.2–1.2)
Total Protein: 7.2 g/dL (ref 6.0–8.3)

## 2020-04-09 LAB — PROTIME-INR
INR: 1.1 ratio — ABNORMAL HIGH (ref 0.8–1.0)
Prothrombin Time: 12.4 s (ref 9.6–13.1)

## 2020-04-09 NOTE — Telephone Encounter (Signed)
The following has been scheduled & pt is aware: COVID - 2/12 @ 10:30 EBUS - 2/15 @ 7:30 AM

## 2020-04-09 NOTE — Assessment & Plan Note (Signed)
Significant bilateral hilar and mediastinal adenopathy, suspicious for sarcoidosis although need to rule out lymphoma or other malignancy.  Discussed the differential diagnosis with him today.  I recommended bronchoscopy with endobronchial ultrasound for nodal biopsies to obtain a formal diagnosis.  He understands and agrees.  He will need to stop his Eliquis 2 days prior.  I will work on getting this set up, hopefully for 04/24/2020.  We will work on setting up bronchoscopy with ultrasound and lymph node biopsies.  This will be done as an outpatient at [facility]  We will try to get this arranged for 04/24/2020. You will need to stop your Eliquis 2 days prior to your procedure. Lab work today Follow with Dr Lamonte Sakai in 1 month or next available

## 2020-04-09 NOTE — Progress Notes (Signed)
Subjective:    Patient ID: Harold Park, male    DOB: [birth year], 55 y.o.   MRN: [id number]  HPI 54 year old never smoker with obesity, severe OSA on CPAP, paroxysmal atrial fibrillation, hypertension and a nonischemic cardiomyopathy with associated systolic CHF, TAA.  He is followed in the advanced CHF clinic.  He is referred today for abnormal CT scan of the chest.   He reports that her has been feeling well, has not noticed any LAD elsewhere. He has been feeling more tired, less energetic, "dragging", for about 6+ months. Has a nagging cough for a few months, happens when he lays down supine. Some nasal / sinus mucous. He feels exertional SOB. He is able to golf but SOB. He has gained about 20 lbs. His CPAP pressures for last 3-4 years.   CT chest aorta/coronary performed on 03/06/2020 reviewed by me shows multiple enlarged mediastinal and bilateral hilar lymph nodes up to 2.9 cm.  No notable parenchymal abnormalities noted but full lung fields were not visualized.   Review of Systems As per HPI  Past Medical History:  Diagnosis Date  . Chronic bronchitis   . HTN (hypertension)   . NICM (nonischemic cardiomyopathy) ([place])   . Obesity (BMI 30-39.9) 12/04/2015  . OSA (obstructive sleep apnea) 08/19/2015   Severe with AHI 64/hr now on CPAP at 13cm H2O  . PAF (paroxysmal atrial fibrillation) ([place])   . Systolic CHF, chronic (HCC)      Family History  Problem Relation Age of Onset  . Cancer Mother        breast  . Hypertension Other   . Heart disease Other   Father did have amyloidosis No hx sarcoid, lymphoma.   Social History   Socioeconomic History  . Marital status: Married    Spouse name: Not on file  . Number of children: 3  . Years of education: Not on file  . Highest education level: Not on file  Occupational History  . Occupation: realtor  Tobacco Use  . Smoking status: Never Smoker  . Smokeless tobacco: Never Used  Substance and Sexual Activity  . Alcohol  use: Yes    Comment: occasional  . Drug use: Not on file  . Sexual activity: Not on file  Other Topics Concern  . Not on file  Social History Narrative  . 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 Amiodarone Nausea Only     Outpatient Medications Prior to Visit  Medication Sig Dispense Refill  . apixaban (ELIQUIS) 5 MG TABS tablet Take 1 tablet (5 mg total) by mouth 2 (two) times daily. 60 tablet 3  . carvedilol (COREG) 25 MG tablet TAKE 1 TABLET (25 MG TOTAL) BY MOUTH 2 (TWO) TIMES DAILY WITH A MEAL. NEEDS APPT 180 tablet 0  . DULoxetine (CYMBALTA) 20 MG capsule TAKE 1 CAPSULE BY MOUTH EVERY DAY 90 capsule 3  . ENTRESTO 97-103 MG TAKE 1 TABLET BY MOUTH TWICE A DAY 180 tablet 2  . ibuprofen (ADVIL,MOTRIN) 200 MG tablet Take 800 mg by mouth every 8 (eight) hours as needed for mild pain  or moderate pain.    . methocarbamol (ROBAXIN) 500 MG tablet Take 500 mg by mouth 2 (two) times daily as needed for muscle spasms.     . metolazone (ZAROXOLYN) 5 MG tablet TAKE ONLY AS DIRECTED BY THE HEART FAILURE CLINIC 12 tablet 0  . potassium chloride SA (KLOR-CON M20) 20 MEQ tablet TAKE 4 TABS IN THE AM, 2 TABS WITH LUNCH AND 4 TABS IN THE PM 300 tablet 2  . ranolazine (RANEXA) 500 MG 12 hr tablet TAKE 1 TABLET (500 MG TOTAL) BY MOUTH 2 (TWO) TIMES DAILY. 180 tablet 3  . sildenafil (VIAGRA) 100 MG tablet TAKE 1 TABLET BY MOUTH EVERY DAY AS NEEDED FOR ERECTILE DYSFUNCTION 2 tablet 9  . spironolactone (ALDACTONE) 50 MG tablet Take 1 tablet (50 mg total) by mouth daily. 90 tablet 3  . torsemide (DEMADEX) 20 MG tablet Take 20 mg by mouth daily.      No facility-administered medications prior to visit.       Objective:   Physical Exam Vitals:   04/09/20 1407  BP: 118/72  Pulse: (!) 104  Temp: 98.4 F (36.9 C)  SpO2: 96%  Weight: (!) 345 lb 9.6 oz (156.8 kg)  Height: 6' 5" (1.956 m)   Gen: Pleasant, obese man, in no distress,  normal affect  ENT: No lesions,  mouth clear,  oropharynx clear, no postnasal drip  Neck: No JVD, no stridor, no cervical nodes  Lungs: No use of accessory muscles, no crackles or wheezing on normal respiration, no wheeze on forced expiration  Cardiovascular: RRR, heart sounds normal, no murmur or gallops, no peripheral edema  Musculoskeletal: No deformities, no cyanosis or clubbing  Neuro: alert, awake, non focal  Skin: Warm, no lesions or rash      Assessment & Plan:  Mediastinal adenopathy Significant bilateral hilar and mediastinal adenopathy, suspicious for sarcoidosis although need to rule out lymphoma or other malignancy.  Discussed the differential diagnosis with him today.  I recommended bronchoscopy with endobronchial ultrasound for nodal biopsies to obtain a formal diagnosis.  He understands and agrees.  He will need to stop his Eliquis 2 days prior.  I will work on getting this set up, hopefully for 04/24/2020.  We will work on setting up bronchoscopy with ultrasound and lymph node biopsies.  This will be done as an outpatient at [facility].  We will try to get this arranged for 04/24/2020. You will need to stop your Eliquis 2 days prior to your procedure. Lab work today Follow with Dr  in 1 month or next available   , MD, PhD 04/09/2020, 5:13 PM [facility] [phone] or if no answer [phone]  

## 2020-04-09 NOTE — H&P (View-Only) (Signed)
Subjective:    Patient ID: Harold Park, male    DOB: [birth year], 55 y.o.   MRN: [id number]  HPI 54 year old never smoker with obesity, severe OSA on CPAP, paroxysmal atrial fibrillation, hypertension and a nonischemic cardiomyopathy with associated systolic CHF, TAA.  He is followed in the advanced CHF clinic.  He is referred today for abnormal CT scan of the chest.   He reports that her has been feeling well, has not noticed any LAD elsewhere. He has been feeling more tired, less energetic, "dragging", for about 6+ months. Has a nagging cough for a few months, happens when he lays down supine. Some nasal / sinus mucous. He feels exertional SOB. He is able to golf but SOB. He has gained about 20 lbs. His CPAP pressures for last 3-4 years.   CT chest aorta/coronary performed on 03/06/2020 reviewed by me shows multiple enlarged mediastinal and bilateral hilar lymph nodes up to 2.9 cm.  No notable parenchymal abnormalities noted but full lung fields were not visualized.   Review of Systems As per HPI  Past Medical History:  Diagnosis Date  . Chronic bronchitis   . HTN (hypertension)   . NICM (nonischemic cardiomyopathy) ([place])   . Obesity (BMI 30-39.9) 12/04/2015  . OSA (obstructive sleep apnea) 08/19/2015   Severe with AHI 64/hr now on CPAP at 13cm H2O  . PAF (paroxysmal atrial fibrillation) ([place])   . Systolic CHF, chronic (HCC)      Family History  Problem Relation Age of Onset  . Cancer Mother        breast  . Hypertension Other   . Heart disease Other   Father did have amyloidosis No hx sarcoid, lymphoma.   Social History   Socioeconomic History  . Marital status: Married    Spouse name: Not on file  . Number of children: 3  . Years of education: Not on file  . Highest education level: Not on file  Occupational History  . Occupation: realtor  Tobacco Use  . Smoking status: Never Smoker  . Smokeless tobacco: Never Used  Substance and Sexual Activity  . Alcohol  use: Yes    Comment: occasional  . Drug use: Not on file  . Sexual activity: Not on file  Other Topics Concern  . Not on file  Social History Narrative  . 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 Amiodarone Nausea Only     Outpatient Medications Prior to Visit  Medication Sig Dispense Refill  . apixaban (ELIQUIS) 5 MG TABS tablet Take 1 tablet (5 mg total) by mouth 2 (two) times daily. 60 tablet 3  . carvedilol (COREG) 25 MG tablet TAKE 1 TABLET (25 MG TOTAL) BY MOUTH 2 (TWO) TIMES DAILY WITH A MEAL. NEEDS APPT 180 tablet 0  . DULoxetine (CYMBALTA) 20 MG capsule TAKE 1 CAPSULE BY MOUTH EVERY DAY 90 capsule 3  . ENTRESTO 97-103 MG TAKE 1 TABLET BY MOUTH TWICE A DAY 180 tablet 2  . ibuprofen (ADVIL,MOTRIN) 200 MG tablet Take 800 mg by mouth every 8 (eight) hours as needed for mild pain  or moderate pain.    . methocarbamol (ROBAXIN) 500 MG tablet Take 500 mg by mouth 2 (two) times daily as needed for muscle spasms.     . metolazone (ZAROXOLYN) 5 MG tablet TAKE ONLY AS DIRECTED BY THE HEART FAILURE CLINIC 12 tablet 0  . potassium chloride SA (KLOR-CON M20) 20 MEQ tablet TAKE 4 TABS IN THE AM, 2 TABS WITH LUNCH AND 4 TABS IN THE PM 300 tablet 2  . ranolazine (RANEXA) 500 MG 12 hr tablet TAKE 1 TABLET (500 MG TOTAL) BY MOUTH 2 (TWO) TIMES DAILY. 180 tablet 3  . sildenafil (VIAGRA) 100 MG tablet TAKE 1 TABLET BY MOUTH EVERY DAY AS NEEDED FOR ERECTILE DYSFUNCTION 2 tablet 9  . spironolactone (ALDACTONE) 50 MG tablet Take 1 tablet (50 mg total) by mouth daily. 90 tablet 3  . torsemide (DEMADEX) 20 MG tablet Take 20 mg by mouth daily.      No facility-administered medications prior to visit.       Objective:   Physical Exam Vitals:   04/09/20 1407  BP: 118/72  Pulse: (!) 104  Temp: 98.4 F (36.9 C)  SpO2: 96%  Weight: (!) 345 lb 9.6 oz (156.8 kg)  Height: 6\' 5"  (1.956 m)   Gen: Pleasant, obese man, in no distress,  normal affect  ENT: No lesions,  mouth clear,  oropharynx clear, no postnasal drip  Neck: No JVD, no stridor, no cervical nodes  Lungs: No use of accessory muscles, no crackles or wheezing on normal respiration, no wheeze on forced expiration  Cardiovascular: RRR, heart sounds normal, no murmur or gallops, no peripheral edema  Musculoskeletal: No deformities, no cyanosis or clubbing  Neuro: alert, awake, non focal  Skin: Warm, no lesions or rash      Assessment & Plan:  Mediastinal adenopathy Significant bilateral hilar and mediastinal adenopathy, suspicious for sarcoidosis although need to rule out lymphoma or other malignancy.  Discussed the differential diagnosis with him today.  I recommended bronchoscopy with endobronchial ultrasound for nodal biopsies to obtain a formal diagnosis.  He understands and agrees.  He will need to stop his Eliquis 2 days prior.  I will work on getting this set up, hopefully for 04/24/2020.  We will work on setting up bronchoscopy with ultrasound and lymph node biopsies.  This will be done as an outpatient at [facility].  We will try to get this arranged for 04/24/2020. You will need to stop your Eliquis 2 days prior to your procedure. Lab work today Follow with Dr Lamonte Sakai in 1 month or next available  Baltazar Apo, MD, PhD 04/09/2020, 5:13 PM [facility] [phone] or if no answer [phone]

## 2020-04-09 NOTE — Patient Instructions (Addendum)
We will work on setting up bronchoscopy with ultrasound and lymph node biopsies.  This will be done as an outpatient at [facility].  We will try to get this arranged for 04/24/2020. You will need to stop your Eliquis 2 days prior to your procedure. Lab work today Follow with Dr Lamonte Sakai in 1 month or next available

## 2020-04-09 NOTE — Telephone Encounter (Signed)
Thanks

## 2020-04-21 ENCOUNTER — Other Ambulatory Visit (HOSPITAL_COMMUNITY)
Admission: RE | Admit: 2020-04-21 | Discharge: 2020-04-21 | Disposition: A | Discharge status: Home / Self Care | Payer: BC Managed Care – PPO | Source: Ambulatory Visit | Attending: Emergency Medicine | Admitting: Emergency Medicine

## 2020-04-21 DIAGNOSIS — Z01812 Encounter for preprocedural laboratory examination: Secondary | ICD-10-CM | POA: Insufficient documentation

## 2020-04-21 DIAGNOSIS — G4733 Obstructive sleep apnea (adult) (pediatric): Secondary | ICD-10-CM | POA: Diagnosis not present

## 2020-04-21 DIAGNOSIS — I428 Other cardiomyopathies: Secondary | ICD-10-CM | POA: Diagnosis not present

## 2020-04-21 DIAGNOSIS — I480 Paroxysmal atrial fibrillation: Secondary | ICD-10-CM | POA: Diagnosis not present

## 2020-04-21 DIAGNOSIS — I5020 Unspecified systolic (congestive) heart failure: Secondary | ICD-10-CM | POA: Diagnosis not present

## 2020-04-21 DIAGNOSIS — I110 Hypertensive heart disease with heart failure: Secondary | ICD-10-CM | POA: Diagnosis not present

## 2020-04-21 DIAGNOSIS — R599 Enlarged lymph nodes, unspecified: Secondary | ICD-10-CM | POA: Diagnosis not present

## 2020-04-21 DIAGNOSIS — Z20822 Contact with and (suspected) exposure to covid-19: Secondary | ICD-10-CM | POA: Insufficient documentation

## 2020-04-21 DIAGNOSIS — Z6841 Body Mass Index (BMI) 40.0 and over, adult: Secondary | ICD-10-CM | POA: Diagnosis not present

## 2020-04-21 DIAGNOSIS — Z7901 Long term (current) use of anticoagulants: Secondary | ICD-10-CM | POA: Diagnosis not present

## 2020-04-21 DIAGNOSIS — Z79899 Other long term (current) drug therapy: Secondary | ICD-10-CM | POA: Diagnosis not present

## 2020-04-21 LAB — SARS CORONAVIRUS 2 (TAT 6-24 HRS): SARS Coronavirus 2: NEGATIVE

## 2020-04-23 ENCOUNTER — Encounter (HOSPITAL_COMMUNITY): Payer: Self-pay | Admitting: Emergency Medicine

## 2020-04-23 NOTE — Anesthesia Preprocedure Evaluation (Addendum)
Anesthesia Evaluation  Patient identified by MRN, date of birth, ID band Patient awake    Reviewed: Allergy & Precautions, NPO status , Patient's Chart, lab work & pertinent test results  Airway Mallampati: II  TM Distance: >3 FB Neck ROM: Full   Comment: Significant amount of redundant neck tissue Dental no notable dental hx.    Pulmonary shortness of breath, sleep apnea (severe) and Continuous Positive Airway Pressure Ventilation ,    breath sounds clear to auscultation       Cardiovascular hypertension, Normal cardiovascular exam Rhythm:Regular Rate:Normal  CT Coronary 03/06/20: - Coronary artery calcium score 0 Agatston units, suggesting low risk for future cardiac events. - Poor coronary images with motion artifact. I suspect that there is no significant coronary disease, but cannot be definitive due to low study quality.  Echo 02/10/20: 1. There is no left ventricular thrombus. Left ventricular ejection fraction, by estimation, is 40 to 45%. The left ventricle has mildly decreased function. The left ventricle demonstrates global hypokinesis. The left ventricular internal cavity size was mildly dilated. Indeterminate diastolic filling due to E-A fusion.  2. Right ventricular systolic function is normal. The right ventricular size is normal. Tricuspid regurgitation signal is inadequate for assessing PA pressure.  3. Left atrial size was mildly dilated.  4. The mitral valve is normal in structure. Mild mitral valve regurgitation. No evidence of mitral stenosis.  5. The aortic valve is tricuspid. Aortic valve regurgitation is not visualized. No aortic stenosis is present.  6. Aortic dilatation noted. There is mild dilatation of the aortic root, measuring 43 mm. There is borderline dilatation of the ascending aorta, measuring 37 mm.  7. The inferior vena cava is normal in size with greater than 50% respiratory variability, suggesting  right atrial pressure of 3 mmHg.  - Comparison(s): No significant change from prior study. Prior images reviewed side by side.     Neuro/Psych PSYCHIATRIC DISORDERS Depression    GI/Hepatic negative GI ROS, Neg liver ROS,   Endo/Other  Morbid obesity  Renal/GU negative Renal ROS     Musculoskeletal negative musculoskeletal ROS (+)   Abdominal (+) + obese,   Peds  Hematology negative hematology ROS (+)   Anesthesia Other Findings   Reproductive/Obstetrics negative OB ROS                            Anesthesia Physical Anesthesia Plan  ASA: III  Anesthesia Plan: General   Post-op Pain Management:    Induction: Intravenous  PONV Risk Score and Plan: 3 and Propofol infusion, Treatment may vary due to age or medical condition, TIVA, Ondansetron and Dexamethasone  Airway Management Planned: Oral ETT  Additional Equipment:   Intra-op Plan:   Post-operative Plan: Extubation in OR  Informed Consent: I have reviewed the patients History and Physical, chart, labs and discussed the procedure including the risks, benefits and alternatives for the proposed anesthesia with the patient or authorized representative who has indicated his/her understanding and acceptance.     Dental advisory given  Plan Discussed with: Anesthesiologist and CRNA  Anesthesia Plan Comments: (GETA. Glidescope available. 1 PIV.  )       Anesthesia Quick Evaluation

## 2020-04-23 NOTE — Progress Notes (Signed)
PCP:  DR. Carolann Littler, MD Cardiologist:  Dr. Glori Bickers, MD  EKG:  01/30/20 CXR:  01/26/20 ECHO:  03/09/20 Stress Test:   Denies Cardiac Cath:  Denies  Fasting Blood Sugar-  N/A Checks Blood Sugar_N/A__ times a day  OSA/CPAP:  Yes, wears nightly  ASA:  No Blood Thinners:  Eliquis last dose 04/21/20  Covid test 2/12 negative  Anesthesia Review:  Yes, cardiac history.  CHF, PAF  Patient denies shortness of breath, fever, cough, and chest pain at PAT appointment.  Patient verbalized understanding of instructions provided today at the PAT appointment.  Patient asked to review instructions at home and day of surgery.

## 2020-04-23 NOTE — Progress Notes (Signed)
Anesthesia Chart Review: Kathleene Hazel    Case: [id number] Date/Time: 04/24/20 0730   Procedure: VIDEO BRONCHOSCOPY WITH ENDOBRONCHIAL ULTRASOUND (N/A )   Anesthesia type: General   Pre-op diagnosis: MEDICINAL HILAR LYMPHADOENOPATHY   Location: [facility] [place] [facility]   Surgeons: Collene Gobble, MD      DISCUSSION: Patient is a 55 year old male scheduled for the above procedure.  Recent imaging showed significant bilateral hilar and mediastinal adenopathy, suspicious for sarcoidosis although other etiologies considered. The above procedure recommended to obtain biopsies and get a formal diagnosis.   History includes never smoker, non-ischemic cardiomyopathy (diagnosed 2979), chronic systolic CHF, PAF (s/p DCCV 06/19/17), HTN, OSA (severe OSA, uses CPAP), chronic bronchitis, splenectomy, obesity.   He was referred to pulmonology by his HF cardiologist Dr. Haroldine Laws. Diagnosed with non-ischemic CM in 2011. Had AF in 2017 that converted on medications, but s/p DCCV 06/19/17. Not felt to be a great candidate for ablation in 06/2017. Known PVC burden ~ 6%, intolerant to amiodarone (severe nausea), so is on Ranexa to suppress PVCs. He is also on Coreg, Entresto, Aldactone, torsemide, Eliquis, CPAP, and as needed Zaroxolyn. EF 40-45% 02/2020. No significant CAD in 2011 cath and had coronary calcium score of 0 with no significant CAD suspected on 02/2020 coronary CT but not definitive due to low study quality. Last Eliquis 04/21/20.    02/18/21 presurgical COVID-19 test negative. He is a same day work-up. Anesthesia team to evaluate on the day of surgery.   VS:   Wt Readings from Last 3 Encounters:  04/09/20 (!) 156.8 kg  03/06/20 (!) 156.5 kg  01/30/20 (!) 157.9 kg   BP Readings from Last 3 Encounters:  04/09/20 118/72  03/06/20 109/64  01/30/20 140/88   Pulse Readings from Last 3 Encounters:  04/09/20 (!) 104  03/06/20 95  01/30/20 98    PROVIDERS: Eulas Post, MD is PCP  -  Glori Bickers, MD is HF cardiologist. Last visit 01/30/20.  Jenkins Rouge, MD is primary cardiologist. Last visit 06/25/15. Primarily followed by HF cardiology now.  Virl Axe, MD is EP cardiologist. Last visit 08/13/15 with 07/02/17 visit with Roderic Palau, NP at the [facility]. Not felt to be the best candidate for ablation then, but may reconsider in the future after lifestyle changes.  Fransico Him, MD is cardiologist for OSA. Last visit 09/07/19.  Baltazar Apo, MD is pulmonologist   LABS: Currently last lab results include: Lab Results  Component Value Date   WBC 11.7 (H) 04/09/2020   HGB 17.5 (H) 04/09/2020   HCT 52.5 (H) 04/09/2020   PLT 265.0 04/09/2020   GLUCOSE 86 04/09/2020   ALT 21 04/09/2020   AST 17 04/09/2020   NA 139 04/09/2020   K 4.2 04/09/2020   CL 107 04/09/2020   CREATININE 1.13 04/09/2020   BUN 18 04/09/2020   CO2 25 04/09/2020   TSH 3.247 01/30/2020   INR 1.1 (H) 04/09/2020   HGBA1C 5.8 (H) 02/02/2019     OTHER: Sleep Study 08/19/15: IMPRESSIONS - Severe obstructive sleep apnea occurred during the diagnostic portion of the study (AHI = 64.4/hour). An optimal PAP pressure was selected for this patient ( 13 cm of water) - No significant central sleep apnea occurred during the diagnostic portion of the study (CAI = 0.0/hour). - Oxygen desaturation was noted during the diagnostic portion of the study (Min O2 = 81.00%). - No snoring was audible during the diagnostic portion of the  study. - Frequent PVCs were noted during this study. - Severe periodic limb movements of sleep occurred during the study.   IMAGES: CTA Chest/aorta 03/06/20: IMPRESSION: 1. No evidence of thoracic aortic aneurysm or dissection. [Ascending thoracic aorta measures 3.8 cm in diameter, mid aortic arch measures 3.0 cm in diameter, and descending thoracic aorta measures 2.3 cm in diameter. 2. Interval development of numerous enlarged mediastinal and  bilateral hilar lymph nodes, as above. Clinical correlation for signs and symptoms of lymphoproliferative disorder or other systemic disease such as sarcoidosis is recommended.   EKG: 01/30/20: Normal sinus rhythm Low voltage QRS Left anterior fascicular block Cannot rule out Inferior infarct (masked by fascicular block?) , age undetermined Cannot rule out Anterior infarct , age undetermined Poor R wave progression Abnormal ECG No significant change was found Confirmed by Virl Axe [phone]) on 01/30/2020 6:26:12 PM   CV: CT Coronary 03/06/20 (read by Loralie Champagne, MD): IMPRESSION: 1. See separate report of CTA chest by radiology. 2. Coronary artery calcium score 0 Agatston units, suggesting low risk for future cardiac events. 3. Poor coronary images with motion artifact. I suspect that there is no significant coronary disease, but cannot be definitive due to low study quality.  Echo 02/10/20: IMPRESSIONS  1. There is no left ventricular thrombus. Left ventricular ejection  fraction, by estimation, is 40 to 45%. The left ventricle has mildly  decreased function. The left ventricle demonstrates global hypokinesis.  The left ventricular internal cavity size  was mildly dilated. Indeterminate diastolic filling due to E-A fusion.  2. Right ventricular systolic function is normal. The right ventricular  size is normal. Tricuspid regurgitation signal is inadequate for assessing  PA pressure.  3. Left atrial size was mildly dilated.  4. The mitral valve is normal in structure. Mild mitral valve  regurgitation. No evidence of mitral stenosis.  5. The aortic valve is tricuspid. Aortic valve regurgitation is not  visualized. No aortic stenosis is present.  6. Aortic dilatation noted. There is mild dilatation of the aortic root,  measuring 43 mm. There is borderline dilatation of the ascending aorta,  measuring 37 mm.  7. The inferior vena cava is normal in size with  greater than 50%  respiratory variability, suggesting right atrial pressure of 3 mmHg.  - Comparison(s): No significant change from prior study. Prior images  reviewed side by side.  - Comparison:  02/16/19 EF 35-40%. LV global hypokinesis. Normal RV size and systolic function. No TR. Ao Root 4.1 cm 11/20/17 EF 35%. Diffuse hypokinesis. Normal RVSF 01/15/17 EF 35%. Diffuse hypokinesis. Grade 1 DD. RV normal 12/11/15 EF 35-40%   12/04/14 LVEF 30-35%, mild diffuse hypokinesis, trivial AI, Mod LAE 06/03/10 LVEF 40-45%, diffuse hypokinesis, grade 1 DD  01/29/10 ([facility]): Minor irregularities RCA, LAD, intermediate. EF 25-30%  48 Hour Holter Monitor 01/15/17: Study Highlight Sinus rhythm with frequent PVCs (6%). Appears to have 2 dominant PVC foci.  - Per Dr. Haroldine Laws, "6% PVCs (stable from previous). Continue to follow. Continue CPAP and Ranexa."  MRI Cardiac 06/11/15 09/23/12 ([facility]): Impressions:  - Dilated aorta particularly at the sinuses of Valsalva as is  conventionally measured, but when indexed for body surface area  qualitatively normal in size  - Severely dilated left ventricle withseverely reduced systolic function  (LVEF = 20%)  - Dilated left and right atria  - Right ventricular volume increased but likely normal when indexed to  body surface area with low normal right ventricular systolic function   Cardiac cath 01/28/10:  Coronary Arteries: 1.  RCA is a large dominant vessel.  There are minor irregularities. 2.  LM is normal. 3.  LCx is normal. 4.  LAD has minor irregularities and is without any obstructive lesions. 5.  Intermediate coronary artery has dual small branches near the intermediate distribution.  He has minor irregularities, otherwise normal. Overall Impression: 1.  Nonischemic cardiomyopathy. 2.  Reduced global left ventricular ejection fraction of approximately 25% to possibly 30%. 3.  Moderate pulmonary hypertension, probably secondary to left  ventricular dysfunction. 4.  Moderate to severe increase in left ventricular and end-diastolic pressures.   Past Medical History:  Diagnosis Date  . Chronic bronchitis   . HTN (hypertension)   . NICM (nonischemic cardiomyopathy) ([place])   . Obesity (BMI 30-39.9) 12/04/2015  . OSA (obstructive sleep apnea) 08/19/2015   Severe with AHI 64/hr now on CPAP at 13cm H2O  . PAF (paroxysmal atrial fibrillation) ([place])   . Systolic CHF, chronic ([place])     Past Surgical History:  Procedure Laterality Date  . APPENDECTOMY    . CARDIAC CATHETERIZATION    . CARDIOVERSION N/A 06/19/2017   Procedure: CARDIOVERSION;  Surgeon: Jolaine Artist, MD;  Location: [facility];  Service: Cardiovascular;  Laterality: N/A;  . LIPOMA RESECTION     arms  . SPLENECTOMY    . SPLIT NIGHT STUDY  08/12/2015    MEDICATIONS: No current facility-administered medications for this encounter.   Marland Kitchen apixaban (ELIQUIS) 5 MG TABS tablet  . carvedilol (COREG) 25 MG tablet  . DULoxetine (CYMBALTA) 20 MG capsule  . ENTRESTO 97-103 MG  . ibuprofen (ADVIL,MOTRIN) 200 MG tablet  . methocarbamol (ROBAXIN) 500 MG tablet  . metolazone (ZAROXOLYN) 5 MG tablet  . potassium chloride SA (KLOR-CON M20) 20 MEQ tablet  . ranolazine (RANEXA) 500 MG 12 hr tablet  . sildenafil (VIAGRA) 100 MG tablet  . spironolactone (ALDACTONE) 50 MG tablet  . torsemide (DEMADEX) 20 MG tablet    Myra Gianotti, PA-C Surgical Short Stay/Anesthesiology [facility] Phone [phone] [facility] Phone [phone] 04/23/2020 11:19 AM

## 2020-04-24 ENCOUNTER — Ambulatory Visit (HOSPITAL_COMMUNITY): Payer: BC Managed Care – PPO | Admitting: Vascular Surgery

## 2020-04-24 ENCOUNTER — Encounter (HOSPITAL_COMMUNITY): Payer: Self-pay | Admitting: Emergency Medicine

## 2020-04-24 ENCOUNTER — Other Ambulatory Visit: Payer: Self-pay

## 2020-04-24 ENCOUNTER — Encounter (HOSPITAL_COMMUNITY)
Admission: RE | Disposition: A | Discharge status: Home / Self Care | Payer: Self-pay | Source: Home / Self Care | Attending: Emergency Medicine

## 2020-04-24 ENCOUNTER — Ambulatory Visit (HOSPITAL_COMMUNITY)
Admission: RE | Admit: 2020-04-24 | Discharge: 2020-04-24 | Disposition: A | Discharge status: Home / Self Care | Payer: BC Managed Care – PPO | Attending: Emergency Medicine | Admitting: Emergency Medicine

## 2020-04-24 DIAGNOSIS — Z79899 Other long term (current) drug therapy: Secondary | ICD-10-CM | POA: Diagnosis not present

## 2020-04-24 DIAGNOSIS — I110 Hypertensive heart disease with heart failure: Secondary | ICD-10-CM | POA: Diagnosis not present

## 2020-04-24 DIAGNOSIS — I480 Paroxysmal atrial fibrillation: Secondary | ICD-10-CM | POA: Diagnosis not present

## 2020-04-24 DIAGNOSIS — G4733 Obstructive sleep apnea (adult) (pediatric): Secondary | ICD-10-CM | POA: Diagnosis not present

## 2020-04-24 DIAGNOSIS — R590 Localized enlarged lymph nodes: Secondary | ICD-10-CM | POA: Diagnosis present

## 2020-04-24 DIAGNOSIS — Z6841 Body Mass Index (BMI) 40.0 and over, adult: Secondary | ICD-10-CM | POA: Insufficient documentation

## 2020-04-24 DIAGNOSIS — Z7901 Long term (current) use of anticoagulants: Secondary | ICD-10-CM | POA: Diagnosis not present

## 2020-04-24 DIAGNOSIS — D869 Sarcoidosis, unspecified: Secondary | ICD-10-CM | POA: Diagnosis present

## 2020-04-24 DIAGNOSIS — I428 Other cardiomyopathies: Secondary | ICD-10-CM | POA: Diagnosis not present

## 2020-04-24 DIAGNOSIS — I5020 Unspecified systolic (congestive) heart failure: Secondary | ICD-10-CM | POA: Insufficient documentation

## 2020-04-24 DIAGNOSIS — Z20822 Contact with and (suspected) exposure to covid-19: Secondary | ICD-10-CM | POA: Diagnosis not present

## 2020-04-24 DIAGNOSIS — I5022 Chronic systolic (congestive) heart failure: Secondary | ICD-10-CM | POA: Diagnosis not present

## 2020-04-24 DIAGNOSIS — R599 Enlarged lymph nodes, unspecified: Secondary | ICD-10-CM | POA: Diagnosis not present

## 2020-04-24 HISTORY — PX: FINE NEEDLE ASPIRATION: SHX5430

## 2020-04-24 HISTORY — PX: BRONCHIAL WASHINGS: SHX5105

## 2020-04-24 HISTORY — PX: VIDEO BRONCHOSCOPY WITH ENDOBRONCHIAL ULTRASOUND: SHX6177

## 2020-04-24 LAB — BASIC METABOLIC PANEL
Anion gap: 11 (ref 5–15)
BUN: 22 mg/dL — ABNORMAL HIGH (ref 6–20)
CO2: 25 mmol/L (ref 22–32)
Calcium: 10 mg/dL (ref 8.9–10.3)
Chloride: 103 mmol/L (ref 98–111)
Creatinine, Ser: 1.42 mg/dL — ABNORMAL HIGH (ref 0.61–1.24)
GFR, Estimated: 58 mL/min — ABNORMAL LOW (ref >60)
Glucose, Bld: 146 mg/dL — ABNORMAL HIGH (ref 70–99)
Potassium: 5.1 mmol/L (ref 3.5–5.1)
Sodium: 139 mmol/L (ref 135–145)

## 2020-04-24 SURGERY — BRONCHOSCOPY, WITH EBUS
Anesthesia: General

## 2020-04-24 MED ORDER — SUCCINYLCHOLINE CHLORIDE 200 MG/10ML IV SOSY
PREFILLED_SYRINGE | INTRAVENOUS | Status: DC | PRN
  Administered 2020-04-24: 180 mg via INTRAVENOUS

## 2020-04-24 MED ORDER — ROCURONIUM BROMIDE 10 MG/ML (PF) SYRINGE
PREFILLED_SYRINGE | INTRAVENOUS | Status: DC | PRN
  Administered 2020-04-24: 20 mg via INTRAVENOUS
  Administered 2020-04-24: 60 mg via INTRAVENOUS

## 2020-04-24 MED ORDER — OXYCODONE HCL 5 MG/5ML PO SOLN: 5.0000 mg | Freq: Once | ORAL | Status: DC | PRN

## 2020-04-24 MED ORDER — ONDANSETRON HCL 4 MG/2ML IJ SOLN: 4.0000 mg | Freq: Once | INTRAMUSCULAR | Status: DC | PRN

## 2020-04-24 MED ORDER — RANOLAZINE ER 500 MG PO TB12: 500.0000 mg | ORAL_TABLET | Freq: Two times a day (BID) | ORAL | Status: DC

## 2020-04-24 MED ORDER — CARVEDILOL 12.5 MG PO TABS
ORAL_TABLET | ORAL | Status: AC
  Filled 2020-04-24: qty 2

## 2020-04-24 MED ORDER — PHENYLEPHRINE 40 MCG/ML (10ML) SYRINGE FOR IV PUSH (FOR BLOOD PRESSURE SUPPORT)
PREFILLED_SYRINGE | INTRAVENOUS | Status: DC | PRN
  Administered 2020-04-24: 120 ug via INTRAVENOUS
  Administered 2020-04-24: 40 ug via INTRAVENOUS
  Administered 2020-04-24 (×2): 120 ug via INTRAVENOUS

## 2020-04-24 MED ORDER — CHLORHEXIDINE GLUCONATE 0.12 % MT SOLN
OROMUCOSAL | Status: AC
  Administered 2020-04-24: 15 mL
  Filled 2020-04-24: qty 15

## 2020-04-24 MED ORDER — LACTATED RINGERS IV SOLN: INTRAVENOUS | Status: DC

## 2020-04-24 MED ORDER — PHENYLEPHRINE HCL-NACL 10-0.9 MG/250ML-% IV SOLN
INTRAVENOUS | Status: DC | PRN
Start: 2020-04-24 — End: 2020-04-24
  Administered 2020-04-24: 75 ug/min via INTRAVENOUS

## 2020-04-24 MED ORDER — SUGAMMADEX SODIUM 200 MG/2ML IV SOLN
INTRAVENOUS | Status: DC | PRN
  Administered 2020-04-24: 400 mg via INTRAVENOUS

## 2020-04-24 MED ORDER — FENTANYL CITRATE (PF) 100 MCG/2ML IJ SOLN
INTRAMUSCULAR | Status: DC | PRN
  Administered 2020-04-24: 150 ug via INTRAVENOUS

## 2020-04-24 MED ORDER — ONDANSETRON HCL 4 MG/2ML IJ SOLN
INTRAMUSCULAR | Status: DC | PRN
  Administered 2020-04-24: 4 mg via INTRAVENOUS

## 2020-04-24 MED ORDER — AMISULPRIDE (ANTIEMETIC) 5 MG/2ML IV SOLN: 10.0000 mg | Freq: Once | INTRAVENOUS | Status: DC | PRN

## 2020-04-24 MED ORDER — ALBUMIN HUMAN 5 % IV SOLN: INTRAVENOUS | Status: DC | PRN

## 2020-04-24 MED ORDER — DULOXETINE HCL 20 MG PO CPEP: 20.0000 mg | ORAL_CAPSULE | Freq: Every day | ORAL | Status: DC

## 2020-04-24 MED ORDER — FENTANYL CITRATE (PF) 100 MCG/2ML IJ SOLN: 25.0000 ug | INTRAMUSCULAR | Status: DC | PRN

## 2020-04-24 MED ORDER — PROPOFOL 10 MG/ML IV BOLUS
INTRAVENOUS | Status: DC | PRN
  Administered 2020-04-24: 200 mg via INTRAVENOUS

## 2020-04-24 MED ORDER — POTASSIUM CHLORIDE CRYS ER 20 MEQ PO TBCR: 40.0000 meq | EXTENDED_RELEASE_TABLET | ORAL | Status: DC

## 2020-04-24 MED ORDER — METOLAZONE 5 MG PO TABS: 5.0000 mg | ORAL_TABLET | Freq: Every day | ORAL | Status: DC | PRN

## 2020-04-24 MED ORDER — CARVEDILOL 12.5 MG PO TABS
25.0000 mg | ORAL_TABLET | ORAL | Status: AC
  Administered 2020-04-24: 25 mg via ORAL

## 2020-04-24 MED ORDER — DEXAMETHASONE SODIUM PHOSPHATE 10 MG/ML IJ SOLN
INTRAMUSCULAR | Status: DC | PRN
  Administered 2020-04-24: 5 mg via INTRAVENOUS

## 2020-04-24 MED ORDER — ENTRESTO 97-103 MG PO TABS: 1.0000 | ORAL_TABLET | Freq: Two times a day (BID) | ORAL | Status: DC

## 2020-04-24 MED ORDER — LIDOCAINE 2% (20 MG/ML) 5 ML SYRINGE
INTRAMUSCULAR | Status: DC | PRN
  Administered 2020-04-24: 100 mg via INTRAVENOUS

## 2020-04-24 MED ORDER — MIDAZOLAM HCL 2 MG/2ML IJ SOLN
INTRAMUSCULAR | Status: DC | PRN
  Administered 2020-04-24: 2 mg via INTRAVENOUS

## 2020-04-24 MED ORDER — OXYCODONE HCL 5 MG PO TABS: 5.0000 mg | ORAL_TABLET | Freq: Once | ORAL | Status: DC | PRN

## 2020-04-24 MED ORDER — SILDENAFIL CITRATE 100 MG PO TABS: 100.0000 mg | ORAL_TABLET | ORAL | Status: DC | PRN

## 2020-04-24 NOTE — Anesthesia Procedure Notes (Signed)
Procedure Name: Intubation Date/Time: 04/24/2020 7:39 AM Performed by: Janace Litten, CRNA Pre-anesthesia Checklist: Patient identified, Emergency Drugs available, Suction available and Patient being monitored Patient Re-evaluated:Patient Re-evaluated prior to induction Oxygen Delivery Method: Circle System Utilized Preoxygenation: Pre-oxygenation with 100% oxygen Induction Type: IV induction Ventilation: Mask ventilation without difficulty Laryngoscope Size: Mac and 4 Grade View: Grade I Tube type: Oral Tube size: 8.5 mm Number of attempts: 1 Airway Equipment and Method: Stylet and Oral airway Placement Confirmation: ETT inserted through vocal cords under direct vision,  positive ETCO2 and breath sounds checked- equal and bilateral Secured at: 24 cm Tube secured with: Tape Dental Injury: Teeth and Oropharynx as per pre-operative assessment

## 2020-04-24 NOTE — Transfer of Care (Signed)
Immediate Anesthesia Transfer of Care Note  Patient: Harold Park  Procedure(s) Performed: VIDEO BRONCHOSCOPY WITH ENDOBRONCHIAL ULTRASOUND (N/A ) BRONCHIAL WASHINGS FINE NEEDLE ASPIRATION (FNA) LINEAR  Patient Location: PACU  Anesthesia Type:General  Level of Consciousness: awake, alert  and patient cooperative  Airway & Oxygen Therapy: Patient Spontanous Breathing and Patient connected to face mask oxygen  Post-op Assessment: Report given to RN and Post -op Vital signs reviewed and stable  Post vital signs: Reviewed and stable  Last Vitals:  Vitals Value Taken Time  BP 117/67 04/24/20 0835  Temp    Pulse 98 04/24/20 0836  Resp 19 04/24/20 0836  SpO2 99 % 04/24/20 0836  Vitals shown include unvalidated device data.  Last Pain:  Vitals:   04/24/20 0636  TempSrc:   PainSc: 0-No pain         Complications: No complications documented.

## 2020-04-24 NOTE — Discharge Instructions (Signed)
Flexible Bronchoscopy, Care After This sheet gives you information about how to care for yourself after your test. Your doctor may also give you more specific instructions. If you have problems or questions, contact your doctor. Follow these instructions at home: Eating and drinking  Do not eat or drink anything (not even water) for 2 hours after your test, or until your numbing medicine (local anesthetic) wears off.  When your numbness is gone and your cough and gag reflexes have come back, you may: ? Eat only soft foods. ? Slowly drink liquids.  The day after the test, go back to your normal diet. Driving  Do not drive for 24 hours if you were given a medicine to help you relax (sedative).  Do not drive or use heavy machinery while taking prescription pain medicine. General instructions   Take over-the-counter and prescription medicines only as told by your doctor.  Return to your normal activities as told. Ask what activities are safe for you.  Do not use any products that have nicotine or tobacco in them. This includes cigarettes and e-cigarettes. If you need help quitting, ask your doctor.  Keep all follow-up visits as told by your doctor. This is important. It is very important if you had a tissue sample (biopsy) taken. Get help right away if:  You have shortness of breath that gets worse.  You get light-headed.  You feel like you are going to pass out (faint).  You have chest pain.  You cough up: ? More than a little blood. ? More blood than before. Summary  Do not eat or drink anything (not even water) for 2 hours after your test, or until your numbing medicine wears off.  Do not use cigarettes. Do not use e-cigarettes.  Get help right away if you have chest pain.  Please call our office for any questions or concerns.  [phone].  This information is not intended to replace advice given to you by your health care provider. Make sure you discuss any  questions you have with your health care provider. Document Released: 12/22/2008 Document Revised: 02/06/2017 Document Reviewed: 03/14/2016 Elsevier Patient Education  2020 [employer].

## 2020-04-24 NOTE — Op Note (Signed)
Video Bronchoscopy with Endobronchial Ultrasound Procedure Note  Date of Operation: 04/24/2020  Pre-op Diagnosis: Mediastinal lymphadenopathy  Post-op Diagnosis: Same  Surgeon: Baltazar Apo  Assistants: None  Anesthesia: General endotracheal anesthesia  Operation: Flexible video fiberoptic bronchoscopy with endobronchial ultrasound and biopsies.  Estimated Blood Loss: Minimal  Complications: None apparent  Indications and History: Harold Park is a 55 y.o. male never smoker with a history of OSA, atrial fibrillation, hypertension and systolic CHF followed in the advanced heart failure clinic.  He underwent a cardiac CT scan 03/06/2020 that showed bulky bilateral mediastinal lymphadenopathy.  Recommendation was made to achieve tissue diagnosis via endobronchial ultrasound and biopsies.  The risks, benefits, complications, treatment options and expected outcomes were discussed with the patient.  The possibilities of pneumothorax, pneumonia, reaction to medication, pulmonary aspiration, perforation of a viscus, bleeding, failure to diagnose a condition and creating a complication requiring transfusion or operation were discussed with the patient who freely signed the consent.    Description of Procedure: The patient was examined in the preoperative area and history and data from the preprocedure consultation were reviewed. It was deemed appropriate to proceed.  The patient was taken to [facility] endoscopy room 2, identified as Harold Park and the procedure verified as Flexible Video Fiberoptic Bronchoscopy.  A Time Out was held and the above information confirmed. After being taken to the operating room general anesthesia was initiated and the patient  was orally intubated. The video fiberoptic bronchoscope was introduced via the endotracheal tube and a general inspection was performed which showed generalized edematous airways with hyperemia and hypervascularity.  There were no  endobronchial lesions or abnormal secretions.  A bronchoalveolar lavage was performed in the anterior segment of the right upper lobe to be sent for microbiology and cytology. The standard scope was then withdrawn and the endobronchial ultrasound was used to identify and characterize the peritracheal, hilar and bronchial lymph nodes. Inspection showed significant enlargement at stations 4R, 7, 10 R. Using real-time ultrasound guidance Wang needle biopsies were take from [name], 7, 10 R nodes and were sent for cytology.  We will plan to pursue FLOW cytometry if no evidence for granulomas.  The patient tolerated the procedure well without apparent complications. There was no significant blood loss. The bronchoscope was withdrawn. Anesthesia was reversed and the patient was taken to the PACU for recovery.   Samples: 1. Wang needle biopsies from 4R node 2. Wang needle biopsies from 7 node 3. Wang needle biopsies from 10 R node 4.  Bronchoalveolar lavage from anterior segment of the right upper lobe  Plans:  The patient will be discharged from the PACU to home when recovered from anesthesia. We will review the cytology, pathology and microbiology results with the patient when they become available. Outpatient followup will be with Dr. Lamonte Sakai.   Baltazar Apo, MD, PhD 04/24/2020, 8:29 AM [facility] [phone] or if no answer before 7:00PM call [phone] For any issues after 7:00PM please call eLink [phone]

## 2020-04-24 NOTE — Anesthesia Postprocedure Evaluation (Signed)
Anesthesia Post Note  Patient: Harold Park  Procedure(s) Performed: VIDEO BRONCHOSCOPY WITH ENDOBRONCHIAL ULTRASOUND (N/A ) BRONCHIAL WASHINGS FINE NEEDLE ASPIRATION (FNA) LINEAR     Patient location during evaluation: Endoscopy Anesthesia Type: General Level of consciousness: awake and alert Pain management: pain level controlled Vital Signs Assessment: post-procedure vital signs reviewed and stable Respiratory status: spontaneous breathing, nonlabored ventilation and respiratory function stable Cardiovascular status: blood pressure returned to baseline and stable Postop Assessment: no apparent nausea or vomiting Anesthetic complications: no   No complications documented.  Last Vitals:  Vitals:   04/24/20 0933 04/24/20 0935  BP:  103/65  Pulse: 87 87  Resp: (!) 21 (!) 24  Temp:  (!) 36.3 C  SpO2: 93% 92%    Last Pain:  Vitals:   04/24/20 0900  TempSrc:   PainSc: 0-No pain                 Merlinda Frederick

## 2020-04-24 NOTE — Interval H&P Note (Signed)
History and Physical Interval Note:  04/24/2020 7:22 AM  Aliene Beams  has presented today for surgery, with the diagnosis of [place].  The various methods of treatment have been discussed with the patient and family. After consideration of risks, benefits and other options for treatment, the patient has consented to  Procedure(s): [place] (N/A) as a surgical intervention.  The patient's history has been reviewed, patient examined, no change in status, stable for surgery.  I have reviewed the patient's chart and labs.  Questions were answered to the patient's satisfaction.     Collene Gobble

## 2020-04-25 LAB — CYTOLOGY - NON PAP

## 2020-04-25 LAB — ACID FAST SMEAR (AFB, MYCOBACTERIA): Acid Fast Smear: NEGATIVE

## 2020-04-26 ENCOUNTER — Encounter (HOSPITAL_COMMUNITY): Payer: Self-pay | Admitting: Emergency Medicine

## 2020-04-26 LAB — CULTURE, BAL-QUANTITATIVE W GRAM STAIN: Culture: 8000 — AB

## 2020-04-27 ENCOUNTER — Telehealth: Payer: Self-pay | Admitting: Emergency Medicine

## 2020-04-27 NOTE — Telephone Encounter (Signed)
Discussed with patient. No malignancy, granulomas seen. cx's pending, but if negative most consistent w sarcoidosis. Will follow cx data.

## 2020-04-29 LAB — AEROBIC/ANAEROBIC CULTURE W GRAM STAIN (SURGICAL/DEEP WOUND): Culture: NO GROWTH

## 2020-04-30 NOTE — Progress Notes (Signed)
Patient ID: Harold Park, male   DOB: Oct 23, 1965, 55 y.o.   MRN: [id number]    Advanced Heart Failure Clinic Note   Primary Care: Harold Post, MD Primary Cardiologist: Harold Hector, MD HF MD: Dr Harold Park  HPI: Harold Park is a 55 y.o. male with NICM with EF 40-98%, chronic systolic CHF, parox A-Flutter with RVR (01/2010) and HTN. He had a cath in 01/2010 demonstrating normal cors, EF 25-30% and moderate pulmonary HTN. Echo done in 01/2010 demonstrated EF 30-35%, mild MR, mild LAE, mild RVE.  Found to be in AF 4/17. Started on eliquis, coreg increased, and TEE/DCCV scheduled for 06/29/15. Was in NSR on his arrival by EKG so this was cancelled.   We saw him for the first time in May 2017. At that time we switched losartan to [facility] and referred to Harold Park and had + sleep study with severe OSA 64/hr. Also saw Dr. Caryl Park in EP. Holter monitor placed to quantify PVCs---> - 6.3%. Unable to tolerate amio in past due to severe nausea. Remains on Ranexa 500 bid to try to suppress PVCs.   Underwent DC-CV on 06/19/17 for recurrent AF Seen in AF Clinic on 07/02/17. Felt not to be ideal candidate for AF ablation at this time but willing to consider with some lifestyle changes/weight loss.   Echo 12/21 EF 40-45%  CT angio in 2019 aortic root was 4.6 cm  CT chest 03/06/20 to evaluate hilar fullness seen on CXR. Interval development of numerous enlarged mediastinal and bilateral hilar lymph nodes  CT coronary 03/06/20. Calcium score 0. Poor arterial imaging but no obvious CAD  Underwent bronchoscopy with biopsy of mediastinal nodes. 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   Here for f/u. Feels ok  Cough and CP have improved. Still would like to have more energy as he fatigues by mid afternoon. Occasionally dizzy when he stands.     Echo 12/20 EF 35-40%. RV ok. No TR. AoRoot 4.1 cm.   ECHO  01/2017 EF 35% Grade IDD RV normal. 12/11/2015 EF 35-40%    12/04/14 LVEF 30-35%, trivial AI, Mod LAE Echo  9/19 EF 35-40%. RV ok.   Social: Works in Personal assistant, Lives in [place]. 3 kids  Past Medical History:  Diagnosis Date  . Chronic bronchitis   . HTN (hypertension)   . NICM (nonischemic cardiomyopathy) ([place])   . Obesity (BMI 30-39.9) 12/04/2015  . OSA (obstructive sleep apnea) 08/19/2015   Severe with AHI 64/hr now on CPAP at 13cm H2O  . PAF (paroxysmal atrial fibrillation) ([place])   . Systolic CHF, chronic (HCC)     Current Outpatient Medications  Medication Sig Dispense Refill  . apixaban (ELIQUIS) 5 MG TABS tablet Take 1 tablet (5 mg total) by mouth 2 (two) times daily. 60 tablet 3  . carvedilol (COREG) 25 MG tablet TAKE 1 TABLET (25 MG TOTAL) BY MOUTH 2 (TWO) TIMES DAILY WITH A MEAL. NEEDS APPT 180 tablet 0  . DULoxetine (CYMBALTA) 20 MG capsule Take 1 capsule (20 mg total) by mouth daily.    Marland Kitchen ibuprofen (ADVIL,MOTRIN) 200 MG tablet Take 800 mg by mouth every 8 (eight) hours as needed for mild pain or moderate pain.    . methocarbamol (ROBAXIN) 500 MG tablet Take 500 mg by mouth 2 (two) times daily as needed for muscle spasms.     . metolazone (ZAROXOLYN) 5 MG tablet Take 1 tablet (5 mg total) by mouth daily as needed (Rapid weight gain). Take only as directed by the  heart failure clinic    . potassium chloride SA (KLOR-CON M20) 20 MEQ tablet Take 2-4 tablets (40-80 mEq total) by mouth See admin instructions. Take 80 mg in the morning , 40 mg with lunch and 80 mg at bedtime    . ranolazine (RANEXA) 500 MG 12 hr tablet Take 1 tablet (500 mg total) by mouth 2 (two) times daily.    . sacubitril-valsartan (ENTRESTO) 97-103 MG Take 1 tablet by mouth 2 (two) times daily.    . sildenafil (VIAGRA) 100 MG tablet Take 1 tablet (100 mg total) by mouth as needed for erectile dysfunction.    Marland Kitchen spironolactone (ALDACTONE) 50 MG tablet Take 1 tablet (50 mg total) by mouth daily. 90 tablet 3  . torsemide (DEMADEX) 20 MG tablet Take 20 mg by mouth 2 (two) times daily.     No current  facility-administered medications for this encounter.    Allergies  Allergen Reactions  . Amiodarone Nausea Only    Social History   Socioeconomic History  . Marital status: Married    Spouse name: Not on file  . Number of children: 3  . Years of education: Not on file  . Highest education level: Not on file  Occupational History  . Occupation: realtor  Tobacco Use  . Smoking status: Never Smoker  . Smokeless tobacco: Never Used  Vaping Use  . Vaping Use: Never used  Substance and Sexual Activity  . Alcohol use: Yes    Comment: occasional  . Drug use: Never  . Sexual activity: Not on file  Other Topics Concern  . Not on file  Social History Narrative  . Not on file   Social Determinants of Health   Financial Resource Strain: Not on file  Food Insecurity: Not on file  Transportation Needs: Not on file  Physical Activity: Not on file  Stress: Not on file  Social Connections: Not on file  Intimate Partner Violence: Not on file    Family History  Problem Relation Age of Onset  . Cancer Mother        breast  . Hypertension Other   . Heart disease Other    Vitals:   05/03/20 0958  BP: 102/70  Pulse: 99  SpO2: 95%  Weight: (!) 155.2 kg (342 lb 3.2 oz)    Wt Readings from Last 3 Encounters:  05/03/20 (!) 155.2 kg (342 lb 3.2 oz)  04/24/20 (!) 154.2 kg (340 lb)  04/09/20 (!) 156.8 kg (345 lb 9.6 oz)     PHYSICAL EXAM: General:  Well appearing. No resp difficulty HEENT: normal Neck: supple. no JVD. Carotids 2+ bilat; no bruits. No lymphadenopathy or thryomegaly appreciated. Cor: PMI nondisplaced. Regular tachy  No rubs, gallops or murmurs. Lungs: clear Abdomen: obese soft, nontender, nondistended. No hepatosplenomegaly. No bruits or masses. Good bowel sounds. Extremities: no cyanosis, clubbing, rash, edema Neuro: alert & orientedx3, cranial nerves grossly intact. moves all 4 extremities w/o difficulty. Affect pleasant   ECG Sinus tach 112 Personally  reviewed   ASSESSMENT & PLAN:  1. Chronic systolic HF - NICM  - Cardiac CT 11/10. EF 64% no CAD - cath in 01/2010 demonstrating normal cors. EF 25-30% - ECHO 01/2017 EF 35%. - Echo 9/19 EF stable at 35-40%.  - Echo 12/20 EF 35-40%. RV ok. No TR. AoRoot 4.1 cm.  - Echo 12/21 EF 40-45%,  - Cardiac CTA 12/21 Calcium score = 0 No obvious CAD (poor images) - Stable NYHA II  - Volume status looks ok. -  Cut torsemide back to 20 daily. Take extra as needed - Continue carvedilol 25 mg twice a day.  - Continue entresto 97-103 mg twice a day.  - Continue spiro 50 mg daily. - Given results of mediastinal biopsy with ? 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 Has not had arrhythmias.  - cMRI in 7/14 no infiltrative disease.Consider PET Scan at [facility]  2. Chest pain - Cardiac CTA 12/21 Calcium score = 0 No obvious CAD (poor images)  3. PAF - s/p DC-CV 06/19/17 - has been seen in AF Clinic. Felt that he needs to lose weight prior to considering AF ablation. He is working on weight loss.  - Remains in NSR - Continue Eliquis. No bleeding currently  - Intolerant amiodarone---> nausea/vomiting. Remains on Ranexa.   4. HTN - BP stable  5. Morbid Obesity  - Encouraged weight loss  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 4.3 cm previously - CT chest 11/19 Ao Root 4.6cm. Remainder of aorta ok  - Repeat CT 12/21: Ascending thoracic aorta measures 3.8 cm in diameter, mid aortic arch measures 3.0 cm in diameter, and descending thoracic aorta measures 2.3 cm in diameter.  7. Severe OSA (64/hr)  - Followed by Dr. Radford Pax. Good control. Continue CPAP  8. Frequent PVCs - Stable. No PVCs on ECG today - Continue Ranexa  9. 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 Lamonte Sakai   10. Sinus tach - unclear etiology.  - check labs including thyroid panel - suspect he may be a little dry. Cutting back torsemide  Glori Bickers, MD  9:24 PM

## 2020-05-03 ENCOUNTER — Encounter (HOSPITAL_COMMUNITY): Payer: Self-pay | Admitting: Internal Medicine

## 2020-05-03 ENCOUNTER — Other Ambulatory Visit: Payer: Self-pay

## 2020-05-03 ENCOUNTER — Ambulatory Visit (HOSPITAL_COMMUNITY)
Admission: RE | Admit: 2020-05-03 | Discharge: 2020-05-03 | Disposition: A | Discharge status: Home / Self Care | Payer: BC Managed Care – PPO | Source: Ambulatory Visit | Attending: Internal Medicine | Admitting: Internal Medicine

## 2020-05-03 VITALS — BP 102/70 | HR 99 | Wt 342.2 lb

## 2020-05-03 DIAGNOSIS — I719 Aortic aneurysm of unspecified site, without rupture: Secondary | ICD-10-CM

## 2020-05-03 DIAGNOSIS — Z79899 Other long term (current) drug therapy: Secondary | ICD-10-CM | POA: Insufficient documentation

## 2020-05-03 DIAGNOSIS — I428 Other cardiomyopathies: Secondary | ICD-10-CM | POA: Diagnosis not present

## 2020-05-03 DIAGNOSIS — Z7901 Long term (current) use of anticoagulants: Secondary | ICD-10-CM | POA: Insufficient documentation

## 2020-05-03 DIAGNOSIS — R42 Dizziness and giddiness: Secondary | ICD-10-CM | POA: Diagnosis not present

## 2020-05-03 DIAGNOSIS — G4733 Obstructive sleep apnea (adult) (pediatric): Secondary | ICD-10-CM

## 2020-05-03 DIAGNOSIS — I272 Pulmonary hypertension, unspecified: Secondary | ICD-10-CM | POA: Diagnosis not present

## 2020-05-03 DIAGNOSIS — I10 Essential (primary) hypertension: Secondary | ICD-10-CM | POA: Diagnosis not present

## 2020-05-03 DIAGNOSIS — R079 Chest pain, unspecified: Secondary | ICD-10-CM | POA: Diagnosis not present

## 2020-05-03 DIAGNOSIS — R000 Tachycardia, unspecified: Secondary | ICD-10-CM | POA: Insufficient documentation

## 2020-05-03 DIAGNOSIS — I5022 Chronic systolic (congestive) heart failure: Secondary | ICD-10-CM

## 2020-05-03 DIAGNOSIS — I480 Paroxysmal atrial fibrillation: Secondary | ICD-10-CM | POA: Diagnosis not present

## 2020-05-03 DIAGNOSIS — I7121 Aneurysm of the ascending aorta, without rupture: Secondary | ICD-10-CM

## 2020-05-03 DIAGNOSIS — I110 Hypertensive heart disease with heart failure: Secondary | ICD-10-CM | POA: Diagnosis not present

## 2020-05-03 DIAGNOSIS — R590 Localized enlarged lymph nodes: Secondary | ICD-10-CM | POA: Diagnosis not present

## 2020-05-03 DIAGNOSIS — R059 Cough, unspecified: Secondary | ICD-10-CM | POA: Insufficient documentation

## 2020-05-03 DIAGNOSIS — I493 Ventricular premature depolarization: Secondary | ICD-10-CM | POA: Diagnosis not present

## 2020-05-03 DIAGNOSIS — D862 Sarcoidosis of lung with sarcoidosis of lymph nodes: Secondary | ICD-10-CM

## 2020-05-03 LAB — CBC
HCT: 57.7 % — ABNORMAL HIGH (ref 39.0–52.0)
Hemoglobin: 18.9 g/dL — ABNORMAL HIGH (ref 13.0–17.0)
MCH: 29.1 pg (ref 26.0–34.0)
MCHC: 32.8 g/dL (ref 30.0–36.0)
MCV: 88.8 fL (ref 80.0–100.0)
Platelets: 263 10*3/uL (ref 150–400)
RBC: 6.5 MIL/uL — ABNORMAL HIGH (ref 4.22–5.81)
RDW: 13.9 % (ref 11.5–15.5)
WBC: 12.2 10*3/uL — ABNORMAL HIGH (ref 4.0–10.5)
nRBC: 0 % (ref 0.0–0.2)

## 2020-05-03 LAB — COMPREHENSIVE METABOLIC PANEL
ALT: 23 U/L (ref 0–44)
AST: 21 U/L (ref 15–41)
Albumin: 3.9 g/dL (ref 3.5–5.0)
Alkaline Phosphatase: 62 U/L (ref 38–126)
Anion gap: 10 (ref 5–15)
BUN: 21 mg/dL — ABNORMAL HIGH (ref 6–20)
CO2: 24 mmol/L (ref 22–32)
Calcium: 9.8 mg/dL (ref 8.9–10.3)
Chloride: 104 mmol/L (ref 98–111)
Creatinine, Ser: 1.2 mg/dL (ref 0.61–1.24)
GFR, Estimated: >60 mL/min (ref >60)
Glucose, Bld: 122 mg/dL — ABNORMAL HIGH (ref 70–99)
Potassium: 5 mmol/L (ref 3.5–5.1)
Sodium: 138 mmol/L (ref 135–145)
Total Bilirubin: 0.8 mg/dL (ref 0.3–1.2)
Total Protein: 7.6 g/dL (ref 6.5–8.1)

## 2020-05-03 LAB — T4, FREE: Free T4: 0.99 ng/dL (ref 0.61–1.12)

## 2020-05-03 LAB — TSH: TSH: 2.635 u[IU]/mL (ref 0.350–4.500)

## 2020-05-03 MED ORDER — TORSEMIDE 20 MG PO TABS: 20.0000 mg | ORAL_TABLET | Freq: Every day | ORAL | 3 refills | Status: DC

## 2020-05-03 NOTE — Patient Instructions (Signed)
DECREASE Torsemide 20mg  (1 tablet) daily (take extra tablet ONLY if needed)  Labs done today, your results will be available in MyChart, we will contact you for abnormal readings.  Please call our office in August 2022 to schedule your follow up appointment  If you have any questions or concerns before your next appointment please send [place] a message through [place] or call our office at [phone].    TO LEAVE A MESSAGE FOR THE NURSE SELECT OPTION 2, PLEASE LEAVE A MESSAGE INCLUDING: . YOUR NAME . DATE OF BIRTH . CALL BACK NUMBER . REASON FOR CALL**this is important as we prioritize the call backs  YOU WILL RECEIVE A CALL BACK THE SAME DAY AS LONG AS YOU CALL BEFORE 4:00 PM

## 2020-05-04 LAB — T3, FREE: T3, Free: 4.1 pg/mL (ref 2.0–4.4)

## 2020-05-07 ENCOUNTER — Encounter: Payer: Self-pay | Admitting: Emergency Medicine

## 2020-05-07 ENCOUNTER — Other Ambulatory Visit: Payer: Self-pay

## 2020-05-07 ENCOUNTER — Ambulatory Visit (INDEPENDENT_AMBULATORY_CARE_PROVIDER_SITE_OTHER): Payer: BC Managed Care – PPO | Admitting: Emergency Medicine

## 2020-05-07 VITALS — BP 116/76 | HR 108 | Temp 97.3°F | Ht 77.0 in | Wt 350.0 lb

## 2020-05-07 DIAGNOSIS — D869 Sarcoidosis, unspecified: Secondary | ICD-10-CM

## 2020-05-07 DIAGNOSIS — G4733 Obstructive sleep apnea (adult) (pediatric): Secondary | ICD-10-CM | POA: Diagnosis not present

## 2020-05-07 DIAGNOSIS — R590 Localized enlarged lymph nodes: Secondary | ICD-10-CM | POA: Diagnosis not present

## 2020-05-07 NOTE — Patient Instructions (Addendum)
Your lymph node biopsies and culture information are most consistent with an inflammatory process called sarcoidosis We will repeat your CT chest in June to follow enlarged lymph nodes We will perform PFT to establish lung function baseline You will need to follow-up with an ophthalmologist annually Continue CPAP every night Continue to follow with Dr. Haroldine Laws as planned Follow with Dr. Lamonte Sakai next available with full pulmonary function testing on the same day.

## 2020-05-07 NOTE — Progress Notes (Signed)
   Subjective:    Patient ID: Harold Park, male    DOB: 08-Apr-1965, 55 y.o.   MRN: [id number]  HPI 55 year old never smoker with obesity, severe OSA on CPAP, paroxysmal atrial fibrillation, hypertension and a nonischemic cardiomyopathy with associated systolic CHF, TAA.  He is followed in the advanced CHF clinic.  He is referred today for abnormal CT scan of the chest.   He reports that her has been feeling well, has not noticed any LAD elsewhere. He has been feeling more tired, less energetic, "dragging", for about 6+ months. Has a nagging cough for a few months, happens when he lays down supine. Some nasal / sinus mucous. He feels exertional SOB. He is able to golf but SOB. He has gained about 20 lbs. His CPAP pressures for last 3-4 years.   CT chest aorta/coronary performed on 03/06/2020 reviewed by me shows multiple enlarged mediastinal and bilateral hilar lymph nodes up to 2.9 cm.  No notable parenchymal abnormalities noted but full lung fields were not visualized.  ROV 05/07/20 --55 year old man who follows up today for his mediastinal and hilar lymphadenopathy noted on CT chest 03/06/2020.  He also has a history of severe OSA on CPAP, A. Fib / flutter, hypertension, nonischemic cardiomyopathy and systolic CHF. He underwent bronchoscopy on 04/24/2020 that allowed needle biopsies of 4R, 7, 10 R.  Node 7 showed evidence for granulomas consistent with sarcoid. BAL culture 04/24/2020 completed and negative for AFB, fungal  He has some residual cough but better. No SOB.    Review of Systems As per HPI      Objective:   Physical Exam Vitals:   05/07/20 1430  BP: 116/76  Pulse: (!) 108  Temp: (!) 97.3 F (36.3 C)  TempSrc: Temporal  SpO2: 96%  Weight: (!) 350 lb (158.8 kg)  Height: _0  (1.956 m)    Gen: Pleasant, obese man, in no distress,  normal affect  ENT: No lesions,  mouth clear,  oropharynx clear, no postnasal drip  Neck: No JVD, no stridor, no cervical  nodes  Lungs: No use of accessory muscles, no crackles or wheezing on normal respiration, no wheeze on forced expiration  Cardiovascular: RRR, heart sounds normal, no murmur or gallops, no peripheral edema  Musculoskeletal: No deformities, no cyanosis or clubbing  Neuro: alert, awake, non focal  Skin: Warm, no lesions or rash      Assessment & Plan:  Sarcoidosis Smears and cultures are negative, work-up most consistent with sarcoidosis.  We need to evaluate for possible associated obstructive lung disease and PFTs are ordered.  Discussed ophthalmology follow-up, cardiology follow-up with him.  Also discussed symptoms that would be associated with flaring including rash.   Your lymph node biopsies and culture information are most consistent with an inflammatory process called sarcoidosis We will repeat your CT chest in June to follow enlarged lymph nodes We will perform PFT to establish lung function baseline You will need to follow-up with an ophthalmologist annually Continue to follow with Dr. Haroldine Laws as planned Follow with Dr. Lamonte Sakai next available with full pulmonary function testing on the same day.  OSA (obstructive sleep apnea) Good compliance with CPAP, plan to continue same.  Good clinical benefit, less daytime sleepiness  Baltazar Apo, MD, PhD 05/10/2020, 3:49 PM [facility] [phone] or if no answer [phone]

## 2020-05-10 ENCOUNTER — Encounter: Payer: Self-pay | Admitting: Emergency Medicine

## 2020-05-10 NOTE — Assessment & Plan Note (Signed)
Smears and cultures are negative, work-up most consistent with sarcoidosis.  We need to evaluate for possible associated obstructive lung disease and PFTs are ordered.  Discussed ophthalmology follow-up, cardiology follow-up with him.  Also discussed symptoms that would be associated with flaring including rash.   Your lymph node biopsies and culture information are most consistent with an inflammatory process called sarcoidosis We will repeat your CT chest in June to follow enlarged lymph nodes We will perform PFT to establish lung function baseline You will need to follow-up with an ophthalmologist annually Continue to follow with Dr. Haroldine Laws as planned Follow with Dr. Lamonte Sakai next available with full pulmonary function testing on the same day.

## 2020-05-10 NOTE — Assessment & Plan Note (Signed)
Good compliance with CPAP, plan to continue same.  Good clinical benefit, less daytime sleepiness

## 2020-05-21 ENCOUNTER — Other Ambulatory Visit (HOSPITAL_COMMUNITY): Payer: Self-pay | Admitting: Internal Medicine

## 2020-05-24 ENCOUNTER — Other Ambulatory Visit (HOSPITAL_COMMUNITY): Payer: Self-pay | Admitting: Cardiology

## 2020-05-24 LAB — FUNGUS CULTURE WITH STAIN

## 2020-05-24 LAB — FUNGAL ORGANISM REFLEX

## 2020-05-24 LAB — FUNGUS CULTURE RESULT

## 2020-06-06 LAB — ACID FAST CULTURE WITH REFLEXED SENSITIVITIES (MYCOBACTERIA): Acid Fast Culture: NEGATIVE

## 2020-06-26 ENCOUNTER — Telehealth: Payer: Self-pay | Admitting: *Deleted

## 2020-06-26 NOTE — Telephone Encounter (Signed)
Called patient to schedule covid test for PFT. Patient needs something for cough. Last visit with Dr. Lamonte Sakai pt states he was told to call back if cough persist. Pt reports non productive cough got better but has returned to same cough as before.

## 2020-06-26 NOTE — Telephone Encounter (Signed)
Pt reports worsening nonprod cough X2 mos- states that it never really improved since seeing RB in February. Has good and bad days. Cough is worse after laying down and when first getting up in the morning. Pt does wear cpap qhs.   Denies fever, chest pain, headache, sinus congestion, PND, mucus production.   Not taken anything to help with s/s. Requesting recs.   Pharmacy: [facility]   Sending to DOD since RB is unavailable. Dr. Loanne Drilling please advise, thanks!

## 2020-06-26 NOTE — Telephone Encounter (Signed)
[facility] Telephone Encounter  Called patient. He reports cough is slightly worse in the last few days. Does not limit activity. Overall quality of cough is unchanged compared to when he was last seen by Dr. Lamonte Sakai. I offered cough syrup as a temporizing measure until his PFTs can be completed. Patient stated he is stable enough for now without meds. I have advised him to contact our office again if symptoms become worse and requiring medications.  Rodman Pickle, M.D. [facility] 06/26/2020 1:30 PM

## 2020-06-27 ENCOUNTER — Other Ambulatory Visit (HOSPITAL_COMMUNITY): Payer: Self-pay | Admitting: Internal Medicine

## 2020-07-03 ENCOUNTER — Other Ambulatory Visit (HOSPITAL_COMMUNITY)
Admission: RE | Admit: 2020-07-03 | Discharge: 2020-07-03 | Disposition: A | Discharge status: Home / Self Care | Payer: BC Managed Care – PPO | Source: Ambulatory Visit | Attending: Emergency Medicine | Admitting: Emergency Medicine

## 2020-07-03 DIAGNOSIS — Z01812 Encounter for preprocedural laboratory examination: Secondary | ICD-10-CM | POA: Diagnosis not present

## 2020-07-03 DIAGNOSIS — Z20822 Contact with and (suspected) exposure to covid-19: Secondary | ICD-10-CM | POA: Diagnosis not present

## 2020-07-04 LAB — SARS CORONAVIRUS 2 (TAT 6-24 HRS): SARS Coronavirus 2: NEGATIVE

## 2020-07-05 ENCOUNTER — Encounter: Payer: Self-pay | Admitting: Emergency Medicine

## 2020-07-05 ENCOUNTER — Other Ambulatory Visit: Payer: Self-pay

## 2020-07-05 ENCOUNTER — Ambulatory Visit (INDEPENDENT_AMBULATORY_CARE_PROVIDER_SITE_OTHER): Payer: BC Managed Care – PPO | Admitting: Emergency Medicine

## 2020-07-05 DIAGNOSIS — D869 Sarcoidosis, unspecified: Secondary | ICD-10-CM | POA: Diagnosis not present

## 2020-07-05 DIAGNOSIS — R590 Localized enlarged lymph nodes: Secondary | ICD-10-CM | POA: Diagnosis not present

## 2020-07-05 LAB — PULMONARY FUNCTION TEST
DL/VA % pred: 125 %
DL/VA: 5.29 ml/min/mmHg/L
DLCO cor % pred: 98 %
DLCO cor: 33.14 ml/min/mmHg
DLCO unc % pred: 98 %
DLCO unc: 33.14 ml/min/mmHg
FEF 25-75 Post: 5.43 L/sec
FEF 25-75 Pre: 3.45 L/sec
FEF2575-%Change-Post: 57 %
FEF2575-%Pred-Post: 144 %
FEF2575-%Pred-Pre: 91 %
FEV1-%Change-Post: 9 %
FEV1-%Pred-Post: 77 %
FEV1-%Pred-Pre: 71 %
FEV1-Post: 3.54 L
FEV1-Pre: 3.24 L
FEV1FVC-%Change-Post: 2 %
FEV1FVC-%Pred-Pre: 107 %
FEV6-%Change-Post: 6 %
FEV6-%Pred-Post: 73 %
FEV6-%Pred-Pre: 68 %
FEV6-Post: 4.2 L
FEV6-Pre: 3.94 L
FEV6FVC-%Pred-Post: 104 %
FEV6FVC-%Pred-Pre: 104 %
FVC-%Change-Post: 6 %
FVC-%Pred-Post: 70 %
FVC-%Pred-Pre: 66 %
FVC-Post: 4.2 L
FVC-Pre: 3.94 L
Post FEV1/FVC ratio: 84 %
Post FEV6/FVC ratio: 100 %
Pre FEV1/FVC ratio: 82 %
Pre FEV6/FVC Ratio: 100 %
RV % pred: 73 %
RV: 1.82 L
TLC % pred: 75 %
TLC: 6.16 L

## 2020-07-05 MED ORDER — ALBUTEROL SULFATE HFA 108 (90 BASE) MCG/ACT IN AERS
2.0000 | INHALATION_SPRAY | Freq: Four times a day (QID) | RESPIRATORY_TRACT | 6 refills | Status: DC | PRN
Start: 2020-07-05 — End: 2021-10-18

## 2020-07-05 MED ORDER — PREDNISONE 20 MG PO TABS: 40.0000 mg | ORAL_TABLET | Freq: Every day | ORAL | 0 refills | Status: DC

## 2020-07-05 NOTE — Patient Instructions (Signed)
Full PFT performed today. °

## 2020-07-05 NOTE — Addendum Note (Signed)
Addended by: Gavin Potters R on: 07/05/2020 03:59 PM   Modules accepted: Orders

## 2020-07-05 NOTE — Patient Instructions (Addendum)
We will start prednisone 40 mg once daily for the next 3 weeks.  After 3 weeks stop. We will give you a prescription for albuterol.  You can use 2 puffs up to every 4 hours if you need it for shortness of breath, chest tightness, wheezing, coughing.  Keep track of how you respond to this medication.  If you benefit then we may decide to add other inhaled medication going forward. We will plan to repeat your CT scan of the chest with contrast in July 2022 to follow your enlarged lymph nodes and sarcoidosis. Follow Dr. Lamonte Sakai in July after your CT so that we can review the results together.

## 2020-07-05 NOTE — Progress Notes (Signed)
   Subjective:    Patient ID: Harold Park, male    DOB: 01/31/1966, 55 y.o.   MRN: [id number]  HPI  ROV 07/05/20 --follow-up visit 55 year old man with obesity, severe OSA on CPAP whom we have seen for bilateral mediastinal and hilar lymphadenopathy.  Bronchoscopy done 04/24/2020 showed no malignancy and evidence for sarcoidosis.  Cultures all negative.  Past medical history also significant for atrial fibrillation, hypertension with a nonischemic cardiomyopathy for which she is followed in the advanced CHF clinic.  He has been dealing with cough, dyspnea. His cough was bothersome in the Fall, had improved some but now has started back. Non-productive, paroxysmal. He is SOB with long walks, inclines, stairs.   Pulmonary function testing done today reviewed by me, shows mixed obstruction and restriction without a bronchodilator response, restricted lung volumes, normal diffusion capacity.    Review of Systems As per HPI      Objective:   Physical Exam Vitals:   07/05/20 1502  BP: 118/76  Pulse: (!) 104  Temp: (!) 97.5 F (36.4 C)  TempSrc: Temporal  SpO2: 94%  Weight: (!) 343 lb (155.6 kg)  Height: 6\' 5"  (1.956 m)    Gen: Pleasant, obese man, in no distress,  normal affect  ENT: No lesions,  mouth clear,  oropharynx clear, no postnasal drip  Neck: No JVD, no stridor, no cervical nodes  Lungs: No use of accessory muscles, no crackles or wheezing on normal respiration, no wheeze on forced expiration  Cardiovascular: RRR, heart sounds normal, no murmur or gallops, no peripheral edema  Musculoskeletal: No deformities, no cyanosis or clubbing  Neuro: alert, awake, non focal  Skin: Warm, no lesions or rash      Assessment & Plan:  Sarcoidosis Granulomatous disease with negative culture data, consistent with sarcoidosis.  No rash, no known other tissue involvement.  Parenchyma are clear but he does have significant lymphadenopathy.  He also has dyspnea, cough and subtle  evidence for obstruction on his pulmonary function testing.  I think we should treat him with a full course prednisone for acute flare, 3 weeks.  We will then repeat his imaging, follow-up to see how he has responded clinically.  We will also do a trial of albuterol to see if he gets benefit from bronchodilator therapy  We will start prednisone 40 mg once daily for the next 3 weeks.  After 3 weeks stop. We will give you a prescription for albuterol.  You can use 2 puffs up to every 4 hours if you need it for shortness of breath, chest tightness, wheezing, coughing.  Keep track of how you respond to this medication.  If you benefit then we may decide to add other inhaled medication going forward. We will plan to repeat your CT scan of the chest with contrast in July 2022 to follow your enlarged lymph nodes and sarcoidosis. Follow Dr. Lamonte Sakai in July after your CT so that we can review the results together.  Baltazar Apo, MD, PhD 07/05/2020, 3:26 PM [place] [phone] or if no answer [phone]

## 2020-07-05 NOTE — Assessment & Plan Note (Signed)
Granulomatous disease with negative culture data, consistent with sarcoidosis.  No rash, no known other tissue involvement.  Parenchyma are clear but he does have significant lymphadenopathy.  He also has dyspnea, cough and subtle evidence for obstruction on his pulmonary function testing.  I think we should treat him with a full course prednisone for acute flare, 3 weeks.  We will then repeat his imaging, follow-up to see how he has responded clinically.  We will also do a trial of albuterol to see if he gets benefit from bronchodilator therapy  We will start prednisone 40 mg once daily for the next 3 weeks.  After 3 weeks stop. We will give you a prescription for albuterol.  You can use 2 puffs up to every 4 hours if you need it for shortness of breath, chest tightness, wheezing, coughing.  Keep track of how you respond to this medication.  If you benefit then we may decide to add other inhaled medication going forward. We will plan to repeat your CT scan of the chest with contrast in July 2022 to follow your enlarged lymph nodes and sarcoidosis. Follow Dr. Lamonte Sakai in July after your CT so that we can review the results together.

## 2020-07-05 NOTE — Progress Notes (Signed)
Full PFT performed today. °

## 2020-07-07 ENCOUNTER — Other Ambulatory Visit: Payer: Self-pay | Admitting: Internal Medicine

## 2020-07-07 ENCOUNTER — Other Ambulatory Visit: Payer: Self-pay | Admitting: Cardiovascular Disease

## 2020-08-08 ENCOUNTER — Telehealth: Payer: Self-pay | Admitting: Emergency Medicine

## 2020-08-08 NOTE — Telephone Encounter (Signed)
I have called the pt and LM on VM for him to call back to get his labs scheduled prior to his CT scan for 07/20.  Will forward back to CB to make her aware.

## 2020-08-12 ENCOUNTER — Other Ambulatory Visit: Payer: Self-pay | Admitting: Cardiovascular Disease

## 2020-08-13 NOTE — Telephone Encounter (Signed)
I have left patient a detailed message and sent a letter explaining he needs labs

## 2020-08-14 ENCOUNTER — Other Ambulatory Visit: Payer: Self-pay | Admitting: Internal Medicine

## 2020-08-16 ENCOUNTER — Other Ambulatory Visit (HOSPITAL_COMMUNITY): Payer: Self-pay | Admitting: *Deleted

## 2020-08-21 ENCOUNTER — Other Ambulatory Visit: Payer: Self-pay | Admitting: Cardiovascular Disease

## 2020-08-21 ENCOUNTER — Other Ambulatory Visit (HOSPITAL_COMMUNITY): Payer: Self-pay | Admitting: *Deleted

## 2020-08-21 MED ORDER — DULOXETINE HCL 20 MG PO CPEP: 20.0000 mg | ORAL_CAPSULE | Freq: Every day | ORAL | 3 refills | Status: DC

## 2020-08-27 ENCOUNTER — Other Ambulatory Visit: Payer: Self-pay | Admitting: Internal Medicine

## 2020-08-27 ENCOUNTER — Encounter (HOSPITAL_COMMUNITY): Payer: Self-pay | Admitting: *Deleted

## 2020-09-19 ENCOUNTER — Telehealth: Payer: Self-pay | Admitting: Emergency Medicine

## 2020-09-19 DIAGNOSIS — R911 Solitary pulmonary nodule: Secondary | ICD-10-CM

## 2020-09-19 NOTE — Telephone Encounter (Signed)
C-Met ordered was placed for pt. Pt was instructed to come into office lab to have blood work collected. Pt states understanding and will try and come into office tomorrow. Nothing further needed at this time.

## 2020-09-20 ENCOUNTER — Other Ambulatory Visit: Payer: Self-pay | Admitting: Emergency Medicine

## 2020-09-20 ENCOUNTER — Other Ambulatory Visit (INDEPENDENT_AMBULATORY_CARE_PROVIDER_SITE_OTHER): Payer: BC Managed Care – PPO

## 2020-09-20 ENCOUNTER — Telehealth: Payer: Self-pay | Admitting: Emergency Medicine

## 2020-09-20 DIAGNOSIS — R911 Solitary pulmonary nodule: Secondary | ICD-10-CM | POA: Diagnosis not present

## 2020-09-20 DIAGNOSIS — R5383 Other fatigue: Secondary | ICD-10-CM

## 2020-09-20 LAB — COMPREHENSIVE METABOLIC PANEL
ALT: 17 U/L (ref 0–53)
AST: 15 U/L (ref 0–37)
Albumin: 3.9 g/dL (ref 3.5–5.2)
Alkaline Phosphatase: 61 U/L (ref 39–117)
BUN: 19 mg/dL (ref 6–23)
CO2: 24 mEq/L (ref 19–32)
Calcium: 9 mg/dL (ref 8.4–10.5)
Chloride: 103 mEq/L (ref 96–112)
Creatinine, Ser: 1.23 mg/dL (ref 0.40–1.50)
GFR: 65.71 mL/min (ref >60.00)
Glucose, Bld: 212 mg/dL — ABNORMAL HIGH (ref 70–99)
Potassium: 4.6 mEq/L (ref 3.5–5.1)
Sodium: 135 mEq/L (ref 135–145)
Total Bilirubin: 0.5 mg/dL (ref 0.2–1.2)
Total Protein: 6.7 g/dL (ref 6.0–8.3)

## 2020-09-20 LAB — TESTOSTERONE: Testosterone: 193.32 ng/dL — ABNORMAL LOW (ref 300.00–890.00)

## 2020-09-20 NOTE — Telephone Encounter (Signed)
Pt in office to have BMET done prior to ct chest with constrast  Pt requested to have testosterone level checked while here  He states his spouse is a nurse, and thinks he has low T due to fatigue and "being grouchy"  I spoke with Dr Lamonte Sakai, and he agreed to order lab since this was convenient for the pt with the understanding that this will not be followed by him, since this is not a pulmonary issue  Pt agrees to call his PCP to let them know we drew labs  Will send this encounter to Dr Elease Hashimoto to make him aware  I will also placed reminder to myself to route labs to Dr Elease Hashimoto once we get them back  Thanks!

## 2020-09-21 NOTE — Telephone Encounter (Signed)
[name], Alinda Sierras, MD  Rosana Berger, CMA Caller: Unspecified (Yesterday, 10:40 AM) I have actually not seen him here in our office since 2012!-  so he will need to set up in office follow to address any abnormal labs.  Thanks for letting me know!   Bruce         I tried calling the pt, it looks like his testosterone level was low, needs to call Dr Elease Hashimoto to re establish. I tried calling the pt twice and each time someone answers the phone and immediately hangs up. Pt was already made aware yesterday when he came into the office that Dr Lamonte Sakai wanted him to discuss with PCP, so I will go ahead and close this encounter.

## 2020-09-26 ENCOUNTER — Other Ambulatory Visit: Payer: Self-pay

## 2020-09-26 ENCOUNTER — Telehealth: Payer: Self-pay | Admitting: Family Medicine

## 2020-09-26 ENCOUNTER — Ambulatory Visit (INDEPENDENT_AMBULATORY_CARE_PROVIDER_SITE_OTHER)
Admission: RE | Admit: 2020-09-26 | Discharge: 2020-09-26 | Disposition: A | Discharge status: Home / Self Care | Payer: BC Managed Care – PPO | Source: Ambulatory Visit | Attending: Emergency Medicine | Admitting: Emergency Medicine

## 2020-09-26 DIAGNOSIS — R590 Localized enlarged lymph nodes: Secondary | ICD-10-CM | POA: Diagnosis not present

## 2020-09-26 DIAGNOSIS — D869 Sarcoidosis, unspecified: Secondary | ICD-10-CM | POA: Diagnosis not present

## 2020-09-26 MED ORDER — IOHEXOL 300 MG/ML  SOLN
80.0000 mL | Freq: Once | INTRAMUSCULAR | Status: AC | PRN
  Administered 2020-09-26: 80 mL via INTRAVENOUS

## 2020-09-26 NOTE — Telephone Encounter (Signed)
Patient called for lab results from labs that he did on 07/14

## 2020-09-26 NOTE — Telephone Encounter (Signed)
Please advise. I do not see that these have been resulted.

## 2020-09-28 NOTE — Telephone Encounter (Signed)
Discussed results with patient, patient expressed understanding. Nothing further needed. Follow up appointment has been scheduled. Pt will schedule a visit to re-establish when he comes into the office.

## 2020-10-03 ENCOUNTER — Other Ambulatory Visit: Payer: Self-pay | Admitting: Internal Medicine

## 2020-10-08 ENCOUNTER — Other Ambulatory Visit: Payer: Self-pay

## 2020-10-08 ENCOUNTER — Encounter: Payer: Self-pay | Admitting: Family Medicine

## 2020-10-08 ENCOUNTER — Ambulatory Visit (INDEPENDENT_AMBULATORY_CARE_PROVIDER_SITE_OTHER): Payer: BC Managed Care – PPO | Admitting: Family Medicine

## 2020-10-08 VITALS — BP 120/90 | HR 79 | Temp 98.2°F | Wt 349.0 lb

## 2020-10-08 DIAGNOSIS — Z1211 Encounter for screening for malignant neoplasm of colon: Secondary | ICD-10-CM

## 2020-10-08 DIAGNOSIS — D751 Secondary polycythemia: Secondary | ICD-10-CM | POA: Diagnosis not present

## 2020-10-08 DIAGNOSIS — R7989 Other specified abnormal findings of blood chemistry: Secondary | ICD-10-CM | POA: Diagnosis not present

## 2020-10-08 NOTE — Progress Notes (Signed)
Established Patient Office Visit  Subjective:  Patient ID: Harold Park, male    DOB: Sep 07, 1965  Age: 55 y.o. MRN: [id number]  CC:  Chief Complaint  Patient presents with   Follow-up    Seen at pulmonology and testosterone levels were low.    HPI Harold Park presents for recent low testosterone level.  He is followed closely by cardiology and pulmonary.  He had some recent fatigue issues and he had requested testosterone level which was done and came back 193.  Recent thyroid functions were normal.  He has chronic problems including history of systolic heart failure, hypertension, atrial fibrillation/flutter, obstructive sleep apnea, sarcoidosis.  He states that he wears CPAP regularly. He has had substantial weight gain he states of 70- some pounds over the past several years and he realizes this may be contributing to his fatigue issues as well.  He does have some polycythemia by recent lab work with hematocrit of 57.  No known family history of polycythemia vera.  No known family history of hemochromatosis.  Non-smoker.  Past Medical History:  Diagnosis Date   Chronic bronchitis    HTN (hypertension)    NICM (nonischemic cardiomyopathy) (HCC)    Obesity (BMI 30-39.9) 12/04/2015   OSA (obstructive sleep apnea) 08/19/2015   Severe with AHI 64/hr now on CPAP at 13cm H2O   PAF (paroxysmal atrial fibrillation) (HCC)    Systolic CHF, chronic ([place])     Past Surgical History:  Procedure Laterality Date   APPENDECTOMY     BRONCHIAL WASHINGS  04/24/2020   Procedure: BRONCHIAL WASHINGS;  Surgeon: Collene Gobble, MD;  Location: [place];  Service: Pulmonary;;   CARDIAC CATHETERIZATION     CARDIOVERSION N/A 06/19/2017   Procedure: CARDIOVERSION;  Surgeon: Jolaine Artist, MD;  Location: [place];  Service: Cardiovascular;  Laterality: N/A;   FINE NEEDLE ASPIRATION  04/24/2020   Procedure: FINE NEEDLE ASPIRATION (FNA) LINEAR;  Surgeon: Collene Gobble, MD;  Location:  [facility];  Service: Pulmonary;;   LIPOMA RESECTION     arms   SPLENECTOMY     SPLIT NIGHT STUDY  08/12/2015   VIDEO BRONCHOSCOPY WITH ENDOBRONCHIAL ULTRASOUND N/A 04/24/2020   Procedure: VIDEO BRONCHOSCOPY WITH ENDOBRONCHIAL ULTRASOUND;  Surgeon: Collene Gobble, MD;  Location: [facility];  Service: Pulmonary;  Laterality: N/A;    Family History  Problem Relation Age of Onset   Cancer Mother        breast   Hypertension Other    Heart disease Other     Social History   Socioeconomic History   Marital status: Married    Spouse name: Not on file   Number of children: 3   Years of education: Not on file   Highest education level: Not on file  Occupational History   Occupation: realtor  Tobacco Use   Smoking status: Never   Smokeless tobacco: Never  Vaping Use   Vaping Use: Never used  Substance and Sexual Activity   Alcohol use: Yes    Comment: occasional   Drug use: Never   Sexual activity: Not on file  Other Topics Concern   Not on file  Social History Narrative   Not on file   Social Determinants of Health   Financial Resource Strain: Not on file  Food Insecurity: Not on file  Transportation Needs: Not on file  Physical Activity: Not on file  Stress: Not on file  Social Connections: Not on file  Intimate Partner Violence: Not on file  Outpatient Medications Prior to Visit  Medication Sig Dispense Refill   albuterol (VENTOLIN HFA) 108 (90 Base) MCG/ACT inhaler Inhale 2 puffs into the lungs every 6 (six) hours as needed for wheezing or shortness of breath. 8 g 6   carvedilol (COREG) 25 MG tablet Take 1 tablet (25 mg total) by mouth 2 (two) times daily with a meal. Needs appt 180 tablet 2   DULoxetine (CYMBALTA) 20 MG capsule Take 1 capsule (20 mg total) by mouth daily. 90 capsule 3   ELIQUIS 5 MG TABS tablet TAKE 1 TABLET (5 MG TOTAL) BY MOUTH 2 (TWO) TIMES DAILY. PATIENT NEEDS TO SCHEDULE F/U APPT 60 tablet 0   ibuprofen (ADVIL,MOTRIN) 200 MG tablet Take  800 mg by mouth every 8 (eight) hours as needed for mild pain or moderate pain.     methocarbamol (ROBAXIN) 500 MG tablet Take 500 mg by mouth 2 (two) times daily as needed for muscle spasms.      metolazone (ZAROXOLYN) 5 MG tablet Take 1 tablet (5 mg total) by mouth daily as needed (Rapid weight gain). Take only as directed by the heart failure clinic     potassium chloride SA (KLOR-CON M20) 20 MEQ tablet Take 4 tablets (80 mEq total) by mouth every morning AND 2 tablets (40 mEq total) daily with lunch AND 4 tablets (80 mEq total) every evening. 900 tablet 1   predniSONE (DELTASONE) 20 MG tablet Take 2 tablets (40 mg total) by mouth daily with breakfast. 21 tablet 0   ranolazine (RANEXA) 500 MG 12 hr tablet TAKE 1 TABLET (500 MG TOTAL) BY MOUTH 2 (TWO) TIMES DAILY. 180 tablet 3   sacubitril-valsartan (ENTRESTO) 97-103 MG Take 1 tablet by mouth 2 (two) times daily.     sildenafil (VIAGRA) 100 MG tablet Take 1 tablet (100 mg total) by mouth as needed for erectile dysfunction.     spironolactone (ALDACTONE) 50 MG tablet TAKE 1 TABLET BY MOUTH EVERY DAY 60 tablet 0   torsemide (DEMADEX) 20 MG tablet Take 1 tablet (20 mg total) by mouth daily. 90 tablet 3   No facility-administered medications prior to visit.    Allergies  Allergen Reactions   Amiodarone Nausea Only    ROS Review of Systems  Constitutional:  Positive for fatigue. Negative for chills and fever.  Cardiovascular:  Negative for chest pain.  Genitourinary:  Negative for dysuria.  Hematological:  Negative for adenopathy.  Psychiatric/Behavioral:  Negative for sleep disturbance.      Objective:    Physical Exam Vitals reviewed.  Cardiovascular:     Rate and Rhythm: Normal rate.  Pulmonary:     Effort: Pulmonary effort is normal.     Breath sounds: Normal breath sounds.  Musculoskeletal:     Cervical back: Neck supple.  Neurological:     General: No focal deficit present.     Mental Status: He is alert.    BP 120/90  (BP Location: Left Arm, Patient Position: Sitting, Cuff Size: Normal)   Pulse 79   Temp 98.2 F (36.8 C) (Oral)   Wt (!) 349 lb (158.3 kg)   SpO2 99%   BMI 41.39 kg/m  Wt Readings from Last 3 Encounters:  10/08/20 (!) 349 lb (158.3 kg)  07/05/20 (!) 343 lb (155.6 kg)  05/07/20 (!) 350 lb (158.8 kg)     Health Maintenance Due  Topic Date Due   HIV Screening  Never done   Hepatitis C Screening  Never done   COLONOSCOPY (Pts 45-80yr  Insurance coverage will need to be confirmed)  Never done   Zoster Vaccines- Shingrix (1 of 2) Never done   COVID-19 Vaccine (4 - Booster for Moderna series) 06/28/2020   INFLUENZA VACCINE  10/08/2020    There are no preventive care reminders to display for this patient.  Lab Results  Component Value Date   TSH 2.635 05/03/2020   Lab Results  Component Value Date   WBC 12.2 (H) 05/03/2020   HGB 18.9 (H) 05/03/2020   HCT 57.7 (H) 05/03/2020   MCV 88.8 05/03/2020   PLT 263 05/03/2020   Lab Results  Component Value Date   NA 135 09/20/2020   K 4.6 09/20/2020   CO2 24 09/20/2020   GLUCOSE 212 (H) 09/20/2020   BUN 19 09/20/2020   CREATININE 1.23 09/20/2020   BILITOT 0.5 09/20/2020   ALKPHOS 61 09/20/2020   AST 15 09/20/2020   ALT 17 09/20/2020   PROT 6.7 09/20/2020   ALBUMIN 3.9 09/20/2020   CALCIUM 9.0 09/20/2020   ANIONGAP 10 05/03/2020   GFR 65.71 09/20/2020   Lab Results  Component Value Date   CHOL 158 02/02/2019   Lab Results  Component Value Date   HDL 28 (L) 02/02/2019   Lab Results  Component Value Date   LDLCALC 75 02/02/2019   Lab Results  Component Value Date   TRIG 275 (H) 02/02/2019   Lab Results  Component Value Date   CHOLHDL 5.6 02/02/2019   Lab Results  Component Value Date   HGBA1C 5.8 (H) 02/02/2019      Assessment & Plan:    #1 fatigue.  Likely multifactorial.  He does have low testosterone by recent single screening lab.  This certainly could be contributing.  However, does have  complicating factor of polycythemia as below (which would exclude replacement even if he is otherwise a candidate) under further addressed.  -We discussed repeating total testosterone preferably between 8 AM and 10 AM.  We will also check [place] and LH to evaluate primary versus secondary causes -If this confirms low testosterone we will have to address his polycythemia issue.  We will recheck CBC  #2 polycythemia by recent lab work.  Patient non-smoker. No known FH Polycythemia vera. -Recheck CBC as first step -Consider further evaluation especially if hemoglobin/hematocrit climbing  #3 health maintenance.  Patient has never had colonoscopy.  He is requesting referral and we will set this up  #4 also noted (after patient had left) in reviewing some prior labs glucose readings ranging 122 to 212.   Not sure if there were fasting.   We will need to get A1C at follow up.   No orders of the defined types were placed in this encounter.   Follow-up: No follow-ups on file.    Carolann Littler, MD

## 2020-10-10 ENCOUNTER — Other Ambulatory Visit: Payer: Self-pay

## 2020-10-10 ENCOUNTER — Other Ambulatory Visit (INDEPENDENT_AMBULATORY_CARE_PROVIDER_SITE_OTHER): Payer: BC Managed Care – PPO

## 2020-10-10 DIAGNOSIS — R7989 Other specified abnormal findings of blood chemistry: Secondary | ICD-10-CM | POA: Diagnosis not present

## 2020-10-10 DIAGNOSIS — D751 Secondary polycythemia: Secondary | ICD-10-CM

## 2020-10-10 LAB — CBC WITH DIFFERENTIAL/PLATELET
Basophils Absolute: 0.1 10*3/uL (ref 0.0–0.1)
Basophils Relative: 0.9 % (ref 0.0–3.0)
Eosinophils Absolute: 0.7 10*3/uL (ref 0.0–0.7)
Eosinophils Relative: 5.7 % — ABNORMAL HIGH (ref 0.0–5.0)
HCT: 50.8 % (ref 39.0–52.0)
Hemoglobin: 16.5 g/dL (ref 13.0–17.0)
Lymphocytes Relative: 17.1 % (ref 12.0–46.0)
Lymphs Abs: 2.1 10*3/uL (ref 0.7–4.0)
MCHC: 32.4 g/dL (ref 30.0–36.0)
MCV: 90.5 fl (ref 78.0–100.0)
Monocytes Absolute: 1.3 10*3/uL — ABNORMAL HIGH (ref 0.1–1.0)
Monocytes Relative: 10.5 % (ref 3.0–12.0)
Neutro Abs: 8 10*3/uL — ABNORMAL HIGH (ref 1.4–7.7)
Neutrophils Relative %: 65.8 % (ref 43.0–77.0)
Platelets: 257 10*3/uL (ref 150.0–400.0)
RBC: 5.62 Mil/uL (ref 4.22–5.81)
RDW: 14 % (ref 11.5–15.5)
WBC: 12.2 10*3/uL — ABNORMAL HIGH (ref 4.0–10.5)

## 2020-10-10 LAB — TESTOSTERONE: Testosterone: 216.5 ng/dL — ABNORMAL LOW (ref 300.00–890.00)

## 2020-10-10 LAB — LUTEINIZING HORMONE: LH: 2.92 m[IU]/mL (ref 1.50–9.30)

## 2020-10-10 LAB — FOLLICLE STIMULATING HORMONE: FSH: 3.3 m[IU]/mL (ref 1.4–18.1)

## 2020-10-12 ENCOUNTER — Other Ambulatory Visit: Payer: Self-pay

## 2020-10-12 ENCOUNTER — Other Ambulatory Visit (INDEPENDENT_AMBULATORY_CARE_PROVIDER_SITE_OTHER): Payer: BC Managed Care – PPO

## 2020-10-12 DIAGNOSIS — R7989 Other specified abnormal findings of blood chemistry: Secondary | ICD-10-CM | POA: Diagnosis not present

## 2020-10-12 DIAGNOSIS — E291 Testicular hypofunction: Secondary | ICD-10-CM | POA: Diagnosis not present

## 2020-10-12 LAB — CORTISOL: Cortisol, Plasma: 10 ug/dL

## 2020-10-13 LAB — IRON,TIBC AND FERRITIN PANEL
%SAT: 24 % (calc) (ref 20–48)
Ferritin: 148 ng/mL (ref 38–380)
Iron: 72 ug/dL (ref 50–180)
TIBC: 297 mcg/dL (calc) (ref 250–425)

## 2020-10-13 LAB — PROLACTIN: Prolactin: 7.4 ng/mL (ref 2.0–18.0)

## 2020-10-17 ENCOUNTER — Telehealth: Payer: Self-pay

## 2020-10-17 NOTE — Telephone Encounter (Signed)
Patient returned call, pt was given lab results and verbalized understanding pt scheduled follow appt  8/12 to discuss further

## 2020-10-17 NOTE — Telephone Encounter (Signed)
Noted  

## 2020-10-18 ENCOUNTER — Other Ambulatory Visit: Payer: Self-pay

## 2020-10-19 ENCOUNTER — Encounter: Payer: Self-pay | Admitting: Family Medicine

## 2020-10-19 ENCOUNTER — Ambulatory Visit (INDEPENDENT_AMBULATORY_CARE_PROVIDER_SITE_OTHER): Payer: BC Managed Care – PPO | Admitting: Family Medicine

## 2020-10-19 VITALS — BP 110/70 | HR 96 | Temp 98.1°F | Wt 351.2 lb

## 2020-10-19 DIAGNOSIS — R7989 Other specified abnormal findings of blood chemistry: Secondary | ICD-10-CM

## 2020-10-19 MED ORDER — TESTOSTERONE CYPIONATE 200 MG/ML IJ SOLN: 200.0000 mg | INTRAMUSCULAR | 0 refills | Status: DC

## 2020-10-19 MED ORDER — "NEEDLE (DISP) 22G X 1"" MISC": 3 refills | Status: DC

## 2020-10-19 NOTE — Patient Instructions (Signed)
Start the testosterone 1 ml (200 mg) IM every 2 weeks  Be sure to set up 2 month follow up labs.

## 2020-10-19 NOTE — Progress Notes (Signed)
Established Patient Office Visit  Subjective:  Patient ID: Harold Park, male    DOB: [birth year]  Age: 55 y.o. MRN: [id number]  CC:  Chief Complaint  Patient presents with   Follow-up    Discuss labs    HPI Harold Park presents for low testosterone.  He had back-to-back levels that were clearly low.  He had extreme fatigue.  He does have history of systolic heart issues and A. fib.  He has gained a lot of weight in recent years and would like to start being more active and trying to lose weight.  We obtained prolactin, FSH, LH, cortisol levels which were all normal.  No evidence for hemochromatosis with iron saturation of 24%.  Has had previous polycythemia but recent hemoglobin and hematocrit were improved.  We have explained that this would have to be followed very closely if he is to go on any sort of replacement.  Past Medical History:  Diagnosis Date   Chronic bronchitis    HTN (hypertension)    NICM (nonischemic cardiomyopathy) (HCC)    Obesity (BMI 30-39.9) 12/04/2015   OSA (obstructive sleep apnea) 08/19/2015   Severe with AHI 64/hr now on CPAP at 13cm H2O   PAF (paroxysmal atrial fibrillation) (HCC)    Systolic CHF, chronic ([place])     Past Surgical History:  Procedure Laterality Date   APPENDECTOMY     BRONCHIAL WASHINGS  04/24/2020   Procedure: BRONCHIAL WASHINGS;  Surgeon: Collene Gobble, MD;  Location: [place];  Service: Pulmonary;;   CARDIAC CATHETERIZATION     CARDIOVERSION N/A 06/19/2017   Procedure: CARDIOVERSION;  Surgeon: Jolaine Artist, MD;  Location: [place];  Service: Cardiovascular;  Laterality: N/A;   FINE NEEDLE ASPIRATION  04/24/2020   Procedure: FINE NEEDLE ASPIRATION (FNA) LINEAR;  Surgeon: Collene Gobble, MD;  [facility];  Service: Pulmonary;;   LIPOMA RESECTION     arms   SPLENECTOMY     SPLIT NIGHT STUDY  08/12/2015   VIDEO BRONCHOSCOPY WITH ENDOBRONCHIAL ULTRASOUND N/A 04/24/2020   Procedure: VIDEO BRONCHOSCOPY  WITH ENDOBRONCHIAL ULTRASOUND;  Surgeon: Collene Gobble, MD;  Location[facility];  Service: Pulmonary;  Laterality: N/A;    Family History  Problem Relation Age of Onset   Cancer Mother        breast   Hypertension Other    Heart disease Other     Social History   Socioeconomic History   Marital status: Married    Spouse name: Not on file   Number of children: 3   Years of education: Not on file   Highest education level: Not on file  Occupational History   Occupation: realtor  Tobacco Use   Smoking status: Never   Smokeless tobacco: Never  Vaping Use   Vaping Use: Never used  Substance and Sexual Activity   Alcohol use: Yes    Comment: occasional   Drug use: Never   Sexual activity: Not on file  Other Topics Concern   Not on file  Social History Narrative   Not on file   Social Determinants of Health   Financial Resource Strain: Not on file  Food Insecurity: Not on file  Transportation Needs: Not on file  Physical Activity: Not on file  Stress: Not on file  Social Connections: Not on file  Intimate Partner Violence: Not on file    Outpatient Medications Prior to Visit  Medication Sig Dispense Refill   albuterol (VENTOLIN HFA) 108 (90 Base) MCG/ACT inhaler  Inhale 2 puffs into the lungs every 6 (six) hours as needed for wheezing or shortness of breath. 8 g 6   carvedilol (COREG) 25 MG tablet Take 1 tablet (25 mg total) by mouth 2 (two) times daily with a meal. Needs appt 180 tablet 2   DULoxetine (CYMBALTA) 20 MG capsule Take 1 capsule (20 mg total) by mouth daily. 90 capsule 3   ELIQUIS 5 MG TABS tablet TAKE 1 TABLET (5 MG TOTAL) BY MOUTH 2 (TWO) TIMES DAILY. PATIENT NEEDS TO SCHEDULE F/U APPT 60 tablet 0   ibuprofen (ADVIL,MOTRIN) 200 MG tablet Take 800 mg by mouth every 8 (eight) hours as needed for mild pain or moderate pain.     methocarbamol (ROBAXIN) 500 MG tablet Take 500 mg by mouth 2 (two) times daily as needed for muscle spasms.      metolazone  (ZAROXOLYN) 5 MG tablet Take 1 tablet (5 mg total) by mouth daily as needed (Rapid weight gain). Take only as directed by the heart failure clinic     potassium chloride SA (KLOR-CON M20) 20 MEQ tablet Take 4 tablets (80 mEq total) by mouth every morning AND 2 tablets (40 mEq total) daily with lunch AND 4 tablets (80 mEq total) every evening. 900 tablet 1   predniSONE (DELTASONE) 20 MG tablet Take 2 tablets (40 mg total) by mouth daily with breakfast. 21 tablet 0   ranolazine (RANEXA) 500 MG 12 hr tablet TAKE 1 TABLET (500 MG TOTAL) BY MOUTH 2 (TWO) TIMES DAILY. 180 tablet 3   sacubitril-valsartan (ENTRESTO) 97-103 MG Take 1 tablet by mouth 2 (two) times daily.     sildenafil (VIAGRA) 100 MG tablet Take 1 tablet (100 mg total) by mouth as needed for erectile dysfunction.     spironolactone (ALDACTONE) 50 MG tablet TAKE 1 TABLET BY MOUTH EVERY DAY 60 tablet 0   torsemide (DEMADEX) 20 MG tablet Take 1 tablet (20 mg total) by mouth daily. 90 tablet 3   No facility-administered medications prior to visit.    Allergies  Allergen Reactions   Amiodarone Nausea Only    ROS Review of Systems  Constitutional:  Positive for fatigue. Negative for appetite change, chills and fever.  Respiratory:  Negative for cough and shortness of breath.   Cardiovascular:  Negative for chest pain.     Objective:    Physical Exam Vitals reviewed.  Cardiovascular:     Rate and Rhythm: Normal rate.  Neurological:     Mental Status: He is alert.    BP 110/70 (BP Location: Left Arm, Patient Position: Sitting, Cuff Size: Normal)   Pulse 96   Temp 98.1 F (36.7 C) (Oral)   Wt (!) 351 lb 3.2 oz (159.3 kg)   SpO2 95%   BMI 41.65 kg/m  Wt Readings from Last 3 Encounters:  10/19/20 (!) 351 lb 3.2 oz (159.3 kg)  10/08/20 (!) 349 lb (158.3 kg)  07/05/20 (!) 343 lb (155.6 kg)     Health Maintenance Due  Topic Date Due   HIV Screening  Never done   Hepatitis C Screening  Never done   COLONOSCOPY (Pts  45-77yr Insurance coverage will need to be confirmed)  Never done   Zoster Vaccines- Shingrix (1 of 2) Never done   COVID-19 Vaccine (4 - Booster for Moderna series) 06/28/2020   INFLUENZA VACCINE  10/08/2020    There are no preventive care reminders to display for this patient.  Lab Results  Component Value Date   TSH 2.635  05/03/2020   Lab Results  Component Value Date   WBC 12.2 (H) 10/10/2020   HGB 16.5 10/10/2020   HCT 50.8 10/10/2020   MCV 90.5 10/10/2020   PLT 257.0 10/10/2020   Lab Results  Component Value Date   NA 135 09/20/2020   K 4.6 09/20/2020   CO2 24 09/20/2020   GLUCOSE 212 (H) 09/20/2020   BUN 19 09/20/2020   CREATININE 1.23 09/20/2020   BILITOT 0.5 09/20/2020   ALKPHOS 61 09/20/2020   AST 15 09/20/2020   ALT 17 09/20/2020   PROT 6.7 09/20/2020   ALBUMIN 3.9 09/20/2020   CALCIUM 9.0 09/20/2020   ANIONGAP 10 05/03/2020   GFR 65.71 09/20/2020   Lab Results  Component Value Date   CHOL 158 02/02/2019   Lab Results  Component Value Date   HDL 28 (L) 02/02/2019   Lab Results  Component Value Date   LDLCALC 75 02/02/2019   Lab Results  Component Value Date   TRIG 275 (H) 02/02/2019   Lab Results  Component Value Date   CHOLHDL 5.6 02/02/2019   Lab Results  Component Value Date   HGBA1C 5.8 (H) 02/02/2019      Assessment & Plan:   Problem List Items Addressed This Visit   None Visit Diagnoses     Low testosterone in male    -  Primary   Relevant Orders   CBC with Differential/Platelet   Testosterone   PSA     Patient has low testosterone with back-to-back low levels.  He has had progressive fatigue and weight gain for several years.  Does not have any history of coagulopathy in terms of DVT or PE and he takes chronic anticoagulation.  -We discussed risk of testosterone replacement including polycythemia and I explained that we would have to monitor testosterone levels, CBC, PSA closely and will need to get follow-up levels in  2 months -We discussed modes of replacement with topical versus intramuscular.  He would like to try the latter.  His wife is a Marine scientist.  We wrote for testosterone cypionate 200 mg/mL 1 cc IM every 14 days -We also ask previously that he run this by his cardiologist to make sure they do not have any cardiac concerns regarding him going on replacement therapy  Meds ordered this encounter  Medications   Testosterone Cypionate 200 MG/ML SOLN    Sig: Inject 200 mg as directed every 14 (fourteen) days.    Dispense:  10 mL    Refill:  0   NEEDLE, DISP, 22 G 22G X 1" MISC    Sig: Use to inject testosterone into the muscle once every 14 days.    Dispense:  6 each    Refill:  3    Follow-up: No follow-ups on file.    Carolann Littler, MD

## 2020-10-23 ENCOUNTER — Encounter: Payer: Self-pay | Admitting: Family Medicine

## 2020-10-23 DIAGNOSIS — R7989 Other specified abnormal findings of blood chemistry: Secondary | ICD-10-CM | POA: Insufficient documentation

## 2020-11-01 ENCOUNTER — Other Ambulatory Visit (HOSPITAL_COMMUNITY): Payer: Self-pay | Admitting: Internal Medicine

## 2021-01-07 ENCOUNTER — Other Ambulatory Visit: Payer: Self-pay | Admitting: Family Medicine

## 2021-01-07 NOTE — Telephone Encounter (Signed)
Spoke with the patient. Lab appointment has been scheduled.

## 2021-01-07 NOTE — Telephone Encounter (Signed)
I did refill his testosterone once but he needs follow-up labs soon with testosterone level, PSA, CBC.  Future orders have already been placed

## 2021-01-07 NOTE — Telephone Encounter (Signed)
Last filled 10/19/2020 Last OV 10/19/2020  Ok to fill?

## 2021-01-08 ENCOUNTER — Other Ambulatory Visit (INDEPENDENT_AMBULATORY_CARE_PROVIDER_SITE_OTHER): Payer: BC Managed Care – PPO

## 2021-01-08 ENCOUNTER — Other Ambulatory Visit: Payer: Self-pay

## 2021-01-08 DIAGNOSIS — R7989 Other specified abnormal findings of blood chemistry: Secondary | ICD-10-CM | POA: Diagnosis not present

## 2021-01-08 LAB — CBC WITH DIFFERENTIAL/PLATELET
Basophils Absolute: 0.1 10*3/uL (ref 0.0–0.1)
Basophils Relative: 1.2 % (ref 0.0–3.0)
Eosinophils Absolute: 0.7 10*3/uL (ref 0.0–0.7)
Eosinophils Relative: 6 % — ABNORMAL HIGH (ref 0.0–5.0)
HCT: 57.1 % — ABNORMAL HIGH (ref 39.0–52.0)
Hemoglobin: 18.2 g/dL (ref 13.0–17.0)
Lymphocytes Relative: 20.3 % (ref 12.0–46.0)
Lymphs Abs: 2.4 10*3/uL (ref 0.7–4.0)
MCHC: 31.8 g/dL (ref 30.0–36.0)
MCV: 89.1 fl (ref 78.0–100.0)
Monocytes Absolute: 1.3 10*3/uL — ABNORMAL HIGH (ref 0.1–1.0)
Monocytes Relative: 11.4 % (ref 3.0–12.0)
Neutro Abs: 7.1 10*3/uL (ref 1.4–7.7)
Neutrophils Relative %: 61.1 % (ref 43.0–77.0)
Platelets: 231 10*3/uL (ref 150.0–400.0)
RBC: 6.41 Mil/uL — ABNORMAL HIGH (ref 4.22–5.81)
RDW: 14.8 % (ref 11.5–15.5)
WBC: 11.6 10*3/uL — ABNORMAL HIGH (ref 4.0–10.5)

## 2021-01-08 LAB — TESTOSTERONE: Testosterone: 291.29 ng/dL — ABNORMAL LOW (ref 300.00–890.00)

## 2021-01-08 LAB — PSA: PSA: 2.28 ng/mL (ref 0.10–4.00)

## 2021-01-09 ENCOUNTER — Other Ambulatory Visit: Payer: Self-pay

## 2021-01-09 DIAGNOSIS — R718 Other abnormality of red blood cells: Secondary | ICD-10-CM

## 2021-01-10 ENCOUNTER — Other Ambulatory Visit: Payer: BC Managed Care – PPO

## 2021-01-10 ENCOUNTER — Telehealth: Payer: Self-pay

## 2021-01-10 ENCOUNTER — Other Ambulatory Visit: Payer: Self-pay

## 2021-01-10 NOTE — Telephone Encounter (Signed)
Patient came into the office for phlebotomy 500cc. Per Norberto Sorenson we are unable to do this in our office anymore. He has spoken with [place] and she is looking into how to get this done for the patient.

## 2021-01-14 NOTE — Telephone Encounter (Signed)
[place] is out of the office. I have reached out to [facility] for assistance.

## 2021-01-14 NOTE — Telephone Encounter (Signed)
Spoke with [employer]. She stated that [facility] lab sent out an e-mail to all providers with the form and information required for the patient to have this done.

## 2021-01-15 NOTE — Telephone Encounter (Signed)
Called and spoke with the patient. He asked that I send their contact info through mychart.

## 2021-01-22 ENCOUNTER — Encounter: Payer: Self-pay | Admitting: Adult Health

## 2021-01-22 ENCOUNTER — Ambulatory Visit (INDEPENDENT_AMBULATORY_CARE_PROVIDER_SITE_OTHER): Payer: BC Managed Care – PPO | Admitting: Adult Health

## 2021-01-22 DIAGNOSIS — U071 COVID-19: Secondary | ICD-10-CM

## 2021-01-22 LAB — POC COVID19 BINAXNOW: SARS Coronavirus 2 Ag: POSITIVE — AB

## 2021-01-22 LAB — POCT INFLUENZA A/B
Influenza A, POC: NEGATIVE
Influenza B, POC: NEGATIVE

## 2021-01-22 MED ORDER — MOLNUPIRAVIR EUA 200MG CAPSULE: 4.0000 | ORAL_CAPSULE | Freq: Two times a day (BID) | ORAL | 0 refills | Status: AC

## 2021-01-22 NOTE — Progress Notes (Signed)
Virtual Visit via Video Note  I connected with Harold Park on 01/22/21 at  3:00 PM EST by a video enabled telemedicine application and verified that I am speaking with the correct person using two identifiers.  Location patient: home Location provider:work or home office Persons participating in the virtual visit: patient, provider  I discussed the limitations of evaluation and management by telemedicine and the availability of in person appointments. The patient expressed understanding and agreed to proceed.   HPI: 55 year old male who is being evaluated today for an acute issue.  His symptoms started roughly 1.5 days ago.  Symptoms include heavy dry cough, wheezing, fever up to 101, body aches, diarrhea.  Home he has been using DayQuil and NyQuil.  He tested positive for COVID-19 and negative for influenza during this visit.  Has history of sarcoidosis, nonischemic cardiomyopathy, OSA, [place] CHF, and PAF   ROS: See pertinent positives and negatives per HPI.  Past Medical History:  Diagnosis Date   Chronic bronchitis    HTN (hypertension)    NICM (nonischemic cardiomyopathy) (HCC)    Obesity (BMI 30-39.9) 12/04/2015   OSA (obstructive sleep apnea) 08/19/2015   Severe with AHI 64/hr now on CPAP at 13cm H2O   PAF (paroxysmal atrial fibrillation) (HCC)    Systolic CHF, chronic ([place])     Past Surgical History:  Procedure Laterality Date   APPENDECTOMY     BRONCHIAL WASHINGS  04/24/2020   Procedure: BRONCHIAL WASHINGS;  Surgeon: Collene Gobble, MD;  Location: [facility];  Service: Pulmonary;;   CARDIAC CATHETERIZATION     CARDIOVERSION N/A 06/19/2017   Procedure: CARDIOVERSION;  Surgeon: Jolaine Artist, MD;  Location: [facility];  Service: Cardiovascular;  Laterality: N/A;   FINE NEEDLE ASPIRATION  04/24/2020   Procedure: FINE NEEDLE ASPIRATION (FNA) LINEAR;  Surgeon: Collene Gobble, MD;  [facility];  Service: Pulmonary;;   LIPOMA RESECTION     arms    SPLENECTOMY     SPLIT NIGHT STUDY  08/12/2015   VIDEO BRONCHOSCOPY WITH ENDOBRONCHIAL ULTRASOUND N/A 04/24/2020   Procedure: VIDEO BRONCHOSCOPY WITH ENDOBRONCHIAL ULTRASOUND;  Surgeon: Collene Gobble, MD;  Location: [facility];  Service: Pulmonary;  Laterality: N/A;    Family History  Problem Relation Age of Onset   Cancer Mother        breast   Hypertension Other    Heart disease Other        Current Outpatient Medications:    albuterol (VENTOLIN HFA) 108 (90 Base) MCG/ACT inhaler, Inhale 2 puffs into the lungs every 6 (six) hours as needed for wheezing or shortness of breath., Disp: 8 g, Rfl: 6   carvedilol (COREG) 25 MG tablet, Take 1 tablet (25 mg total) by mouth 2 (two) times daily with a meal. Needs appt, Disp: 180 tablet, Rfl: 2   DULoxetine (CYMBALTA) 20 MG capsule, Take 1 capsule (20 mg total) by mouth daily., Disp: 90 capsule, Rfl: 3   ELIQUIS 5 MG TABS tablet, TAKE 1 TABLET (5 MG TOTAL) BY MOUTH 2 (TWO) TIMES DAILY. PATIENT NEEDS TO SCHEDULE F/U APPT, Disp: 60 tablet, Rfl: 0   ibuprofen (ADVIL,MOTRIN) 200 MG tablet, Take 800 mg by mouth every 8 (eight) hours as needed for mild pain or moderate pain., Disp: , Rfl:    KLOR-CON M20 20 MEQ tablet, TAKE 4 TABLETS BY MOUTH EVERY MORNING AND 2 TABLETS DAILY W/ LUNCH AND 4 TABS EVERY EVENING., Disp: 900 tablet, Rfl: 1   methocarbamol (ROBAXIN) 500 MG tablet, Take  500 mg by mouth 2 (two) times daily as needed for muscle spasms. , Disp: , Rfl:    metolazone (ZAROXOLYN) 5 MG tablet, Take 1 tablet (5 mg total) by mouth daily as needed (Rapid weight gain). Take only as directed by the heart failure clinic, Disp: , Rfl:    molnupiravir EUA (LAGEVRIO) 200 mg CAPS capsule, Take 4 capsules (800 mg total) by mouth 2 (two) times daily for 5 days., Disp: 40 capsule, Rfl: 0   NEEDLE, DISP, 22 G 22G X 1" MISC, Use to inject testosterone into the muscle once every 14 days., Disp: 6 each, Rfl: 3   predniSONE (DELTASONE) 20 MG tablet, Take 2 tablets  (40 mg total) by mouth daily with breakfast., Disp: 21 tablet, Rfl: 0   ranolazine (RANEXA) 500 MG 12 hr tablet, TAKE 1 TABLET (500 MG TOTAL) BY MOUTH 2 (TWO) TIMES DAILY., Disp: 180 tablet, Rfl: 3   sacubitril-valsartan (ENTRESTO) 97-103 MG, Take 1 tablet by mouth 2 (two) times daily., Disp: , Rfl:    sildenafil (VIAGRA) 100 MG tablet, Take 1 tablet (100 mg total) by mouth as needed for erectile dysfunction., Disp: , Rfl:    spironolactone (ALDACTONE) 50 MG tablet, TAKE 1 TABLET BY MOUTH EVERY DAY, Disp: 60 tablet, Rfl: 0   testosterone cypionate (DEPOTESTOSTERONE CYPIONATE) 200 MG/ML injection, INJECT 1 ML (200 MG) AS DIRECTED EVERY 14 (FOURTEEN) DAYS., Disp: 6 mL, Rfl: 0   torsemide (DEMADEX) 20 MG tablet, Take 1 tablet (20 mg total) by mouth daily., Disp: 90 tablet, Rfl: 3  EXAM:  VITALS per patient if applicable:  GENERAL: alert, oriented, appears well and in no acute distress  HEENT: atraumatic, conjunttiva clear, no obvious abnormalities on inspection of external nose and ears  NECK: normal movements of the head and neck  LUNGS: on inspection no signs of respiratory distress, breathing rate appears normal, no obvious gross SOB, gasping or wheezing  CV: no obvious cyanosis  MS: moves all visible extremities without noticeable abnormality  PSYCH/NEURO: pleasant and cooperative, no obvious depression or anxiety, speech and thought processing grossly intact  ASSESSMENT AND PLAN:  Discussed the following assessment and plan:  1. COVID-19 virus infection  - POC Influenza A/B- negative  - POC COVID-19- Positive  -Due to past medical history and symptoms.  We will treat with antiviral therapy.  Red flags reviewed.  Follow-up if no improvement in the next few days - molnupiravir EUA (LAGEVRIO) 200 mg CAPS capsule; Take 4 capsules (800 mg total) by mouth 2 (two) times daily for 5 days.  Dispense: 40 capsule; Refill: 0   I discussed the assessment and treatment plan with the  patient. The patient was provided an opportunity to ask questions and all were answered. The patient agreed with the plan and demonstrated an understanding of the instructions.   The patient was advised to call back or seek an in-person evaluation if the symptoms worsen or if the condition fails to improve as anticipated.   Dorothyann Peng, NP

## 2021-03-18 ENCOUNTER — Telehealth: Payer: Self-pay | Admitting: Family Medicine

## 2021-03-18 NOTE — Telephone Encounter (Signed)
Patient stated that he wanted a referral for phlebotomy because they one he was initially referred to isn't returning his calls.  Patient could be contacted at [phone].  Please advise.

## 2021-03-19 NOTE — Telephone Encounter (Signed)
Yes this was to [facility] and yes we did send them the form.

## 2021-03-20 NOTE — Telephone Encounter (Signed)
ATC, machine stated they had a higher than normal call volume and to call back. No option to give a voice mail.

## 2021-03-21 NOTE — Telephone Encounter (Signed)
Noted  

## 2021-03-21 NOTE — Telephone Encounter (Signed)
Appt scheduled for Sat 1/14 at 11a.  Left detailed voicemail with appointment information & phone number to [facility] if appt date/time needed to be changed.

## 2021-03-22 ENCOUNTER — Ambulatory Visit (INDEPENDENT_AMBULATORY_CARE_PROVIDER_SITE_OTHER): Payer: BC Managed Care – PPO | Admitting: Family Medicine

## 2021-03-22 VITALS — BP 130/80 | HR 50 | Temp 98.1°F | Wt 341.1 lb

## 2021-03-22 DIAGNOSIS — K432 Incisional hernia without obstruction or gangrene: Secondary | ICD-10-CM | POA: Diagnosis not present

## 2021-03-22 DIAGNOSIS — D751 Secondary polycythemia: Secondary | ICD-10-CM | POA: Insufficient documentation

## 2021-03-22 DIAGNOSIS — R7989 Other specified abnormal findings of blood chemistry: Secondary | ICD-10-CM | POA: Diagnosis not present

## 2021-03-22 NOTE — Patient Instructions (Signed)
I am placing surgical referral but suspect incisional hernia  Come next week for repeat CBC.

## 2021-03-22 NOTE — Progress Notes (Signed)
Established Patient Office Visit  Subjective:  Patient ID: Harold Park, male    DOB: 1965/04/18  Age: 56 y.o. MRN: [id number]  CC:  Chief Complaint  Patient presents with   Hernia    Mid stomach, has a scar that seems to be protruding, some slight discomfort     HPI Harold Park presents for the following issues  He has what he thinks may be a hernia anterior abdomen.  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  He subsequently had a second incision through that region for appendectomy.  He recently noticed some bulging especially on the right side of the scar but slightly on the left.  Only minimal discomfort.  Worse with straining and lifting.  Even though his pain is minimal at this time he would like to seek surgical consultation.  He is worried about complication risk.  He has history of systolic heart failure, hypertension, atrial flutter, obstructive sleep apnea.  Low testosteron.  Recently started on intramuscular replacement with 200 mg every 14 days.  His testosterone level came up slightly to 291.  However, he had some polycythemia with hematocrit 57.  He is scheduled for phlebotomy but does not get this until tomorrow.  He is aware that he will likely need to get periodic phlebotomy every couple months or so if he is to remain on testosterone.  He does feel more energy since starting the testosterone but realizes his levels are still not optimal.  We will reluctant to increase testosterone dose until polycythemia addressed  Past Medical History:  Diagnosis Date   Chronic bronchitis    HTN (hypertension)    NICM (nonischemic cardiomyopathy) (HCC)    Obesity (BMI 30-39.9) 12/04/2015   OSA (obstructive sleep apnea) 08/19/2015   Severe with AHI 64/hr now on CPAP at 13cm H2O   PAF (paroxysmal atrial fibrillation) (HCC)    Systolic CHF, chronic ([place])     Past Surgical History:  Procedure Laterality Date   APPENDECTOMY     BRONCHIAL WASHINGS   04/24/2020   Procedure: BRONCHIAL WASHINGS;  Surgeon: Collene Gobble, MD;  Location: [facility];  Service: Pulmonary;;   CARDIAC CATHETERIZATION     CARDIOVERSION N/A 06/19/2017   Procedure: CARDIOVERSION;  Surgeon: Jolaine Artist, MD;  Location: [facility];  Service: Cardiovascular;  Laterality: N/A;   FINE NEEDLE ASPIRATION  04/24/2020   Procedure: FINE NEEDLE ASPIRATION (FNA) LINEAR;  Surgeon: Collene Gobble, MD;  [facility];  Service: Pulmonary;;   LIPOMA RESECTION     arms   SPLENECTOMY     SPLIT NIGHT STUDY  08/12/2015   VIDEO BRONCHOSCOPY WITH ENDOBRONCHIAL ULTRASOUND N/A 04/24/2020   Procedure: VIDEO BRONCHOSCOPY WITH ENDOBRONCHIAL ULTRASOUND;  Surgeon: Collene Gobble, MD;  [facility];  Service: Pulmonary;  Laterality: N/A;    Family History  Problem Relation Age of Onset   Cancer Mother        breast   Hypertension Other    Heart disease Other     Social History   Socioeconomic History   Marital status: Married    Spouse name: Not on file   Number of children: 3   Years of education: Not on file   Highest education level: Not on file  Occupational History   Occupation: realtor  Tobacco Use   Smoking status: Never   Smokeless tobacco: Never  Vaping Use   Vaping Use: Never used  Substance and Sexual Activity   Alcohol use: Yes  Comment: occasional   Drug use: Never   Sexual activity: Not on file  Other Topics Concern   Not on file  Social History Narrative   Not on file   Social Determinants of Health   Financial Resource Strain: Not on file  Food Insecurity: Not on file  Transportation Needs: Not on file  Physical Activity: Not on file  Stress: Not on file  Social Connections: Not on file  Intimate Partner Violence: Not on file    Outpatient Medications Prior to Visit  Medication Sig Dispense Refill   albuterol (VENTOLIN HFA) 108 (90 Base) MCG/ACT inhaler Inhale 2 puffs into the lungs every 6 (six) hours as needed  for wheezing or shortness of breath. 8 g 6   carvedilol (COREG) 25 MG tablet Take 1 tablet (25 mg total) by mouth 2 (two) times daily with a meal. Needs appt 180 tablet 2   DULoxetine (CYMBALTA) 20 MG capsule Take 1 capsule (20 mg total) by mouth daily. 90 capsule 3   ELIQUIS 5 MG TABS tablet TAKE 1 TABLET (5 MG TOTAL) BY MOUTH 2 (TWO) TIMES DAILY. PATIENT NEEDS TO SCHEDULE F/U APPT 60 tablet 0   ibuprofen (ADVIL,MOTRIN) 200 MG tablet Take 800 mg by mouth every 8 (eight) hours as needed for mild pain or moderate pain.     KLOR-CON M20 20 MEQ tablet TAKE 4 TABLETS BY MOUTH EVERY MORNING AND 2 TABLETS DAILY W/ LUNCH AND 4 TABS EVERY EVENING. 900 tablet 1   methocarbamol (ROBAXIN) 500 MG tablet Take 500 mg by mouth 2 (two) times daily as needed for muscle spasms.      metolazone (ZAROXOLYN) 5 MG tablet Take 1 tablet (5 mg total) by mouth daily as needed (Rapid weight gain). Take only as directed by the heart failure clinic     NEEDLE, DISP, 22 G 22G X 1" MISC Use to inject testosterone into the muscle once every 14 days. 6 each 3   predniSONE (DELTASONE) 20 MG tablet Take 2 tablets (40 mg total) by mouth daily with breakfast. 21 tablet 0   ranolazine (RANEXA) 500 MG 12 hr tablet TAKE 1 TABLET (500 MG TOTAL) BY MOUTH 2 (TWO) TIMES DAILY. 180 tablet 3   sacubitril-valsartan (ENTRESTO) 97-103 MG Take 1 tablet by mouth 2 (two) times daily.     sildenafil (VIAGRA) 100 MG tablet Take 1 tablet (100 mg total) by mouth as needed for erectile dysfunction.     spironolactone (ALDACTONE) 50 MG tablet TAKE 1 TABLET BY MOUTH EVERY DAY 60 tablet 0   testosterone cypionate (DEPOTESTOSTERONE CYPIONATE) 200 MG/ML injection INJECT 1 ML (200 MG) AS DIRECTED EVERY 14 (FOURTEEN) DAYS. 6 mL 0   torsemide (DEMADEX) 20 MG tablet Take 1 tablet (20 mg total) by mouth daily. 90 tablet 3   No facility-administered medications prior to visit.    Allergies  Allergen Reactions   Amiodarone Nausea Only    ROS Review of  Systems  Constitutional:  Negative for chills and fever.  Respiratory:  Negative for cough and shortness of breath.   Cardiovascular:  Negative for chest pain.  Gastrointestinal:  Negative for blood in stool, constipation, diarrhea, nausea and vomiting.     Objective:    Physical Exam Vitals reviewed.  Constitutional:      Appearance: Normal appearance.  Cardiovascular:     Rate and Rhythm: Normal rate and regular rhythm.  Pulmonary:     Effort: Pulmonary effort is normal.     Breath sounds: Normal breath  sounds.  Abdominal:     Comments: As long vertical scar from previous surgery.  He does have bulge right side of the scar and to lesser stent left side especially when sitting up.  Nontender.  Neurological:     Mental Status: He is alert.    BP 130/80 (BP Location: Left Arm, Patient Position: Sitting, Cuff Size: Normal)    Pulse (!) 50    Temp 98.1 F (36.7 C) (Oral)    Wt (!) 341 lb 1.6 oz (154.7 kg)    SpO2 96%    BMI 40.45 kg/m  Wt Readings from Last 3 Encounters:  03/22/21 (!) 341 lb 1.6 oz (154.7 kg)  10/19/20 (!) 351 lb 3.2 oz (159.3 kg)  10/08/20 (!) 349 lb (158.3 kg)     Health Maintenance Due  Topic Date Due   Pneumococcal Vaccine 74-57 Years old (1 - PCV) Never done   HIV Screening  Never done   Hepatitis C Screening  Never done   COLONOSCOPY (Pts 45-57yrs Insurance coverage will need to be confirmed)  Never done   Zoster Vaccines- Shingrix (1 of 2) Never done   COVID-19 Vaccine (4 - Booster for Moderna series) 04/24/2020   INFLUENZA VACCINE  10/08/2020    There are no preventive care reminders to display for this patient.  Lab Results  Component Value Date   TSH 2.635 05/03/2020   Lab Results  Component Value Date   WBC 11.6 (H) 01/08/2021   HGB 18.2 Repeated and verified X2. (HH) 01/08/2021   HCT 57.1 (H) 01/08/2021   MCV 89.1 01/08/2021   PLT 231.0 01/08/2021   Lab Results  Component Value Date   NA 135 09/20/2020   K 4.6 09/20/2020   CO2  24 09/20/2020   GLUCOSE 212 (H) 09/20/2020   BUN 19 09/20/2020   CREATININE 1.23 09/20/2020   BILITOT 0.5 09/20/2020   ALKPHOS 61 09/20/2020   AST 15 09/20/2020   ALT 17 09/20/2020   PROT 6.7 09/20/2020   ALBUMIN 3.9 09/20/2020   CALCIUM 9.0 09/20/2020   ANIONGAP 10 05/03/2020   GFR 65.71 09/20/2020   Lab Results  Component Value Date   CHOL 158 02/02/2019   Lab Results  Component Value Date   HDL 28 (L) 02/02/2019   Lab Results  Component Value Date   LDLCALC 75 02/02/2019   Lab Results  Component Value Date   TRIG 275 (H) 02/02/2019   Lab Results  Component Value Date   CHOLHDL 5.6 02/02/2019   Lab Results  Component Value Date   HGBA1C 5.8 (H) 02/02/2019      Assessment & Plan:   #1 incisional hernia.  No acute findings to suggest acute strangulation.  We reviewed signs and symptoms of strangulation.  Set up surgical referral to further assess.  #2 low testosterone.  Patient is on replacement.  Has some secondary polycythemia.  Scheduled for phlebotomy tomorrow.  We placed future order for CBC next week to reassess hemoglobin and hematocrit.  Will likely need phlebotomy every couple of months if he is to remain on testosterone.   No orders of the defined types were placed in this encounter.   Follow-up: No follow-ups on file.    Carolann Littler, MD

## 2021-04-03 ENCOUNTER — Other Ambulatory Visit (INDEPENDENT_AMBULATORY_CARE_PROVIDER_SITE_OTHER): Payer: BC Managed Care – PPO

## 2021-04-03 DIAGNOSIS — D751 Secondary polycythemia: Secondary | ICD-10-CM | POA: Diagnosis not present

## 2021-04-03 LAB — CBC WITH DIFFERENTIAL/PLATELET
Basophils Absolute: 0.2 10*3/uL — ABNORMAL HIGH (ref 0.0–0.1)
Basophils Relative: 1.7 % (ref 0.0–3.0)
Eosinophils Absolute: 0.6 10*3/uL (ref 0.0–0.7)
Eosinophils Relative: 5.4 % — ABNORMAL HIGH (ref 0.0–5.0)
HCT: 59.5 % — ABNORMAL HIGH (ref 39.0–52.0)
Hemoglobin: 19 g/dL (ref 13.0–17.0)
Lymphocytes Relative: 21.1 % (ref 12.0–46.0)
Lymphs Abs: 2.3 10*3/uL (ref 0.7–4.0)
MCHC: 32 g/dL (ref 30.0–36.0)
MCV: 86.2 fl (ref 78.0–100.0)
Monocytes Absolute: 1 10*3/uL (ref 0.1–1.0)
Monocytes Relative: 9.5 % (ref 3.0–12.0)
Neutro Abs: 6.7 10*3/uL (ref 1.4–7.7)
Neutrophils Relative %: 62.3 % (ref 43.0–77.0)
Platelets: 233 10*3/uL (ref 150.0–400.0)
RBC: 6.9 Mil/uL — ABNORMAL HIGH (ref 4.22–5.81)
RDW: 17 % — ABNORMAL HIGH (ref 11.5–15.5)
WBC: 10.7 10*3/uL — ABNORMAL HIGH (ref 4.0–10.5)

## 2021-04-12 ENCOUNTER — Telehealth: Payer: Self-pay | Admitting: Family Medicine

## 2021-04-12 DIAGNOSIS — Z1211 Encounter for screening for malignant neoplasm of colon: Secondary | ICD-10-CM

## 2021-04-12 DIAGNOSIS — R718 Other abnormality of red blood cells: Secondary | ICD-10-CM

## 2021-04-12 NOTE — Telephone Encounter (Signed)
Pt call and stated he is returning your call and want a call back. 

## 2021-04-15 NOTE — Telephone Encounter (Signed)
See result notes. Patient wanted to let Dr. Elease Hashimoto know that he did get the phlebotomy done 1 week prior to labs. Dr. Elease Hashimoto responded in the "routing comments" since this message was removed from my basket I am unable to see the notes from Dr. Elease Hashimoto.   Will send back to Dr. Elease Hashimoto for further advise.

## 2021-04-15 NOTE — Telephone Encounter (Signed)
Spoke with the patient and discussed Dr. Anastasio Auerbach message in detail. Patient expressed understanding. Nothing further needed at this time.  Future lab orders placed. Lab appointment scheduled.   Patient stated he was supposed to have a colonoscopy done but I do not see orders for this? Please advise.

## 2021-04-16 NOTE — Telephone Encounter (Signed)
Order placed in December is no longer valid. New order has been placed. The patient is aware and will call back if he has not heard from someone regarding this within the next week.

## 2021-04-22 ENCOUNTER — Other Ambulatory Visit (HOSPITAL_COMMUNITY): Payer: Self-pay | Admitting: Internal Medicine

## 2021-04-25 DIAGNOSIS — K432 Incisional hernia without obstruction or gangrene: Secondary | ICD-10-CM | POA: Diagnosis not present

## 2021-04-25 DIAGNOSIS — H1045 Other chronic allergic conjunctivitis: Secondary | ICD-10-CM | POA: Diagnosis not present

## 2021-04-25 DIAGNOSIS — H33001 Unspecified retinal detachment with retinal break, right eye: Secondary | ICD-10-CM | POA: Diagnosis not present

## 2021-04-30 ENCOUNTER — Other Ambulatory Visit: Payer: Self-pay | Admitting: Surgery

## 2021-04-30 DIAGNOSIS — K432 Incisional hernia without obstruction or gangrene: Secondary | ICD-10-CM

## 2021-05-20 ENCOUNTER — Ambulatory Visit
Admission: RE | Admit: 2021-05-20 | Discharge: 2021-05-20 | Disposition: A | Discharge status: Home / Self Care | Payer: BC Managed Care – PPO | Source: Ambulatory Visit | Attending: Surgery | Admitting: Surgery

## 2021-05-20 DIAGNOSIS — K432 Incisional hernia without obstruction or gangrene: Secondary | ICD-10-CM

## 2021-05-20 DIAGNOSIS — K5730 Diverticulosis of large intestine without perforation or abscess without bleeding: Secondary | ICD-10-CM | POA: Diagnosis not present

## 2021-05-20 DIAGNOSIS — M47816 Spondylosis without myelopathy or radiculopathy, lumbar region: Secondary | ICD-10-CM | POA: Diagnosis not present

## 2021-05-20 DIAGNOSIS — N281 Cyst of kidney, acquired: Secondary | ICD-10-CM | POA: Diagnosis not present

## 2021-05-20 MED ORDER — IOPAMIDOL (ISOVUE-300) INJECTION 61%
100.0000 mL | Freq: Once | INTRAVENOUS | Status: AC | PRN
  Administered 2021-05-20: 100 mL via INTRAVENOUS

## 2021-05-26 ENCOUNTER — Other Ambulatory Visit (HOSPITAL_COMMUNITY): Payer: Self-pay | Admitting: Internal Medicine

## 2021-05-26 NOTE — Progress Notes (Signed)
Patient ID: Harold Park, male   DOB: April 28, 1965, 56 y.o.   MRN: [id number] ? ?  ?Advanced Heart Failure Clinic Note  ? ?Primary Care: Harold Post, MD ?Primary Cardiologist: Harold Hector, MD ?HF MD: Harold Park ? ?HPI: ?Harold Park is a 56 y.o. male with NICM with EF 16-94%, chronic systolic CHF, parox A-Flutter with RVR (01/2010) and HTN. He had a cath in 01/2010 demonstrating normal cors, EF 25-30% and moderate pulmonary HTN. Echo done in 01/2010 demonstrated EF 30-35%, mild MR, mild LAE, mild RVE. ? ?Found to be in AF 4/17. Started on eliquis. TEE/DCCV scheduled. Was in NSR on his arrival. ? ?We saw him for the first time in May 2017. At that time we switched losartan to [facility] and referred to Harold Park and had + sleep study with severe OSA 64/hr. Also saw Harold. Caryl Park in EP. Holter monitor placed to quantify PVCs---> - 6.3%. Unable to tolerate amio in past due to severe nausea. Remains on Ranexa 500 bid to try to suppress PVCs.  ? ?Underwent DC-CV on 06/19/17 for recurrent AF Seen in AF Clinic on 07/02/17. Felt not to be ideal candidate for AF ablation ? ?Echo 12/21 EF 40-45% ? ?CT angio in 2019 aortic root was 4.6 cm ? ?CT chest 03/06/20 to evaluate hilar fullness seen on CXR. Interval development of numerous enlarged mediastinal and bilateral hilar lymph nodes ? ?CT coronary 03/06/20. Calcium score 0. Poor arterial imaging but no obvious CAD ? ?Underwent bronchoscopy with biopsy of mediastinal nodes. 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 Treated with prednisone.  ? ?Here for f/u. Recently had severe fatigue. Testosterone found to be low. Felt better with T supp but hgb went up so he stopped. Now getting fatigued again. No CP, orthopnea or PND. Stopped spiro due to concern over low testosterone ? ?Echo 12/20 EF 35-40%. RV ok. No TR. AoRoot 4.1 cm.  ? ?ECHO  ?01/2017 EF 35% Grade IDD RV normal. ?12/11/2015 EF 35-40%   ? 12/04/14 LVEF 30-35%, trivial AI, Mod LAE ?Echo 9/19 EF 35-40%. RV ok.  ? ?Social: Works in  Personal assistant, Lives in [place]. 3 kids ? ?Past Medical History:  ?Diagnosis Date  ? Chronic bronchitis   ? HTN (hypertension)   ? NICM (nonischemic cardiomyopathy) ([place])   ? Obesity (BMI 30-39.9) 12/04/2015  ? OSA (obstructive sleep apnea) 08/19/2015  ? Severe with AHI 64/hr now on CPAP at 13cm H2O  ? PAF (paroxysmal atrial fibrillation) ([place])   ? Systolic CHF, chronic ([place])   ? ? ?Current Outpatient Medications  ?Medication Sig Dispense Refill  ? albuterol (VENTOLIN HFA) 108 (90 Base) MCG/ACT inhaler Inhale 2 puffs into the lungs every 6 (six) hours as needed for wheezing or shortness of breath. 8 g 6  ? carvedilol (COREG) 25 MG tablet Take 25 mg by mouth daily in the afternoon.    ? DULoxetine (CYMBALTA) 20 MG capsule Take 1 capsule (20 mg total) by mouth daily. 90 capsule 3  ? ELIQUIS 5 MG TABS tablet TAKE 1 TABLET (5 MG TOTAL) BY MOUTH 2 (TWO) TIMES DAILY. PATIENT NEEDS TO SCHEDULE F/U APPT 60 tablet 0  ? ibuprofen (ADVIL,MOTRIN) 200 MG tablet Take 800 mg by mouth every 8 (eight) hours as needed for mild pain or moderate pain.    ? KLOR-CON M20 20 MEQ tablet TAKE 4 TABLETS BY MOUTH EVERY MORNING AND 2 TABLETS DAILY W/ LUNCH AND 4 TABS EVERY EVENING. 900 tablet 1  ? methocarbamol (ROBAXIN) 500 MG tablet Take  500 mg by mouth 2 (two) times daily as needed for muscle spasms.     ? metolazone (ZAROXOLYN) 5 MG tablet Take 1 tablet (5 mg total) by mouth daily as needed (Rapid weight gain). Take only as directed by the heart failure clinic    ? NEEDLE, DISP, 22 G 22G X 1" MISC Use to inject testosterone into the muscle once every 14 days. 6 each 3  ? ranolazine (RANEXA) 500 MG 12 hr tablet TAKE 1 TABLET (500 MG TOTAL) BY MOUTH 2 (TWO) TIMES DAILY. 180 tablet 3  ? sacubitril-valsartan (ENTRESTO) 97-103 MG Take 1 tablet by mouth 2 (two) times daily.    ? sildenafil (VIAGRA) 100 MG tablet Take 1 tablet (100 mg total) by mouth as needed for erectile dysfunction.    ? torsemide (DEMADEX) 20 MG tablet TAKE 1 TABLET BY MOUTH  EVERY DAY 90 tablet 3  ? ?No current facility-administered medications for this encounter.  ? ? ?Allergies  ?Allergen Reactions  ? Amiodarone Nausea Only  ?  ?Social History  ? ?Socioeconomic History  ? Marital status: Married  ?  Spouse name: Not on file  ? Number of children: 3  ? Years of education: Not on file  ? Highest education level: Not on file  ?Occupational History  ? Occupation: realtor  ?Tobacco Use  ? Smoking status: Never  ? Smokeless tobacco: Never  ?Vaping Use  ? Vaping Use: Never used  ?Substance and Sexual Activity  ? Alcohol use: Yes  ?  Comment: occasional  ? Drug use: Never  ? Sexual activity: Not on file  ?Other Topics Concern  ? Not on file  ?Social History Narrative  ? Not on file  ? ?Social Determinants of Health  ? ?Financial Resource Strain: Not on file  ?Food Insecurity: Not on file  ?Transportation Needs: Not on file  ?Physical Activity: Not on file  ?Stress: Not on file  ?Social Connections: Not on file  ?Intimate Partner Violence: Not on file  ? ? ?Family History  ?Problem Relation Age of Onset  ? Cancer Mother   ?     breast  ? Hypertension Other   ? Heart disease Other   ? ?Vitals:  ? 05/27/21 1135  ?BP: 108/70  ?Pulse: 75  ?SpO2: 95%  ?Weight: (!) 154.5 kg (340 lb 9.6 oz)  ? ? ? ?Wt Readings from Last 3 Encounters:  ?05/27/21 (!) 154.5 kg (340 lb 9.6 oz)  ?03/22/21 (!) 154.7 kg (341 lb 1.6 oz)  ?10/19/20 (!) 159.3 kg (351 lb 3.2 oz)  ?  ? ?PHYSICAL EXAM: ?General:  Well appearing. No resp difficulty ?HEENT: normal ?Neck: supple. no JVD. Carotids 2+ bilat; no bruits. No lymphadenopathy or thryomegaly appreciated. ?Cor: PMI nondisplaced. Regular rate & rhythm. No rubs, gallops or murmurs. ?Lungs: clear ?Abdomen: obese soft, nontender, nondistended. No hepatosplenomegaly. No bruits or masses. Good bowel sounds. ?Extremities: no cyanosis, clubbing, rash, edema ?Neuro: alert & orientedx3, cranial nerves grossly intact. moves all 4 extremities w/o difficulty. Affect pleasant ? ?ECG: NSR  96 IVCD (117m) Personally reviewed ? ? ?ASSESSMENT & PLAN: ? ?1. Chronic systolic HF - NICM  ?- Cardiac CT 11/10. EF 64% no CAD ?- cath in 01/2010 demonstrating normal cors. EF 25-30% ?- ECHO 01/2017 EF 35%. ?- Echo 9/19 EF stable at 35-40%.  ?- Echo 12/20 EF 35-40%. RV ok. No TR. AoRoot 4.1 cm.  ?- Echo 12/21 EF 40-45%,  ?- Cardiac CTA 12/21 Calcium score = 0 No obvious CAD (poor  images) ?- Stable NYHA II ?- Volume status looks ok ?- Cut torsemide back to 20 daily. Take extra as needed ?- Continue carvedilol 25 mg twice a day.  ?- Continue entresto 97-103 mg twice a day.  ?- He has been of spiro due to concern over low testosterone. Start eplerenone ?- Given results of mediastinal biopsy with ? 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 Has not had arrhythmias.  ?- Will repeat echo  ?- cMRI in 7/14 no infiltrative disease. Will order PET Scan at [facility] ? ?2. Chest pain ?- Cardiac CTA 12/21 Calcium score = 0 No obvious CAD (poor images) ?- resolved ? ?3. PAF ?- s/p DC-CV 06/19/17 ?- has been seen in AF Clinic. Felt that he needs to lose weight prior to considering AF ablation. He is working on weight loss.  ?- Remains in NSR ?- Continue Eliquis. No bleeding currently  ?- Intolerant amiodarone---> nausea/vomiting. Remains on Ranexa.  ? ?4. HTN ?- Blood pressure well controlled. Continue current regimen. ? ?5. Morbid Obesity  ?- Encouraged weight loss ?- consider GLP1RA - will refer ? ?6. Aortic Root dilation ?- 4.6 cm by Echo 9/19. 4.3 cm previously ?- CT chest 11/19 Ao Root 4.6cm. Remainder of aorta ok  ?- Repeat CT 12/21: Ascending thoracic aorta measures 3.8 cm in diameter, mid aortic arch measures 3.0 cm in diameter, and descending thoracic aorta measures 2.3 cm in diameter. ?- CT 7/22 "No significant vascular findings" ?- Repeat echo ? ?7. Severe OSA (64/hr)  ?- Followed by Harold. Radford Pax. Good control. Continue CPAP ? ?8. Frequent PVCs ?- Stable. Burden much improved ?- Continue Ranexa ? ?9. Pulmonary  sarcoid ?- treated with prednisone ?- follows with Pulmonary ? ?Glori Bickers, MD  ?11:55 AM ? ? ? ?

## 2021-05-27 ENCOUNTER — Other Ambulatory Visit: Payer: Self-pay

## 2021-05-27 ENCOUNTER — Encounter (HOSPITAL_COMMUNITY): Payer: Self-pay | Admitting: Internal Medicine

## 2021-05-27 ENCOUNTER — Ambulatory Visit (HOSPITAL_COMMUNITY)
Admission: RE | Admit: 2021-05-27 | Discharge: 2021-05-27 | Disposition: A | Discharge status: Home / Self Care | Payer: BC Managed Care – PPO | Source: Ambulatory Visit | Attending: Internal Medicine | Admitting: Internal Medicine

## 2021-05-27 VITALS — BP 108/70 | HR 75 | Wt 340.6 lb

## 2021-05-27 DIAGNOSIS — I5022 Chronic systolic (congestive) heart failure: Secondary | ICD-10-CM | POA: Insufficient documentation

## 2021-05-27 DIAGNOSIS — I7121 Aneurysm of the ascending aorta, without rupture: Secondary | ICD-10-CM

## 2021-05-27 DIAGNOSIS — I110 Hypertensive heart disease with heart failure: Secondary | ICD-10-CM | POA: Diagnosis not present

## 2021-05-27 DIAGNOSIS — I272 Pulmonary hypertension, unspecified: Secondary | ICD-10-CM | POA: Diagnosis not present

## 2021-05-27 DIAGNOSIS — E669 Obesity, unspecified: Secondary | ICD-10-CM

## 2021-05-27 DIAGNOSIS — Z7901 Long term (current) use of anticoagulants: Secondary | ICD-10-CM | POA: Diagnosis not present

## 2021-05-27 DIAGNOSIS — D860 Sarcoidosis of lung: Secondary | ICD-10-CM | POA: Diagnosis not present

## 2021-05-27 DIAGNOSIS — G4733 Obstructive sleep apnea (adult) (pediatric): Secondary | ICD-10-CM | POA: Diagnosis not present

## 2021-05-27 DIAGNOSIS — I428 Other cardiomyopathies: Secondary | ICD-10-CM | POA: Diagnosis not present

## 2021-05-27 DIAGNOSIS — Z79899 Other long term (current) drug therapy: Secondary | ICD-10-CM | POA: Diagnosis not present

## 2021-05-27 DIAGNOSIS — R079 Chest pain, unspecified: Secondary | ICD-10-CM | POA: Diagnosis not present

## 2021-05-27 DIAGNOSIS — D862 Sarcoidosis of lung with sarcoidosis of lymph nodes: Secondary | ICD-10-CM | POA: Diagnosis not present

## 2021-05-27 DIAGNOSIS — I480 Paroxysmal atrial fibrillation: Secondary | ICD-10-CM | POA: Diagnosis not present

## 2021-05-27 MED ORDER — EPLERENONE 25 MG PO TABS: 25.0000 mg | ORAL_TABLET | Freq: Every day | ORAL | 11 refills | Status: DC

## 2021-05-27 NOTE — Patient Instructions (Signed)
Stop Spironolactone ? ?Start Eplerenone 25 mg Daily ? ?Your physician has requested that you have an echocardiogram. Echocardiography is a painless test that uses sound waves to create images of your heart. It provides your doctor with information about the size and shape of your heart and how well your heart?s chambers and valves are working. This procedure takes approximately one hour. There are no restrictions for this procedure. ? ?Your provider has recommended you have a Cardiac PET Scan at [facility]. We will get this approved with your insurance company and get it scheduled for you. We will call you with the date and time and instructions. [facility] will call you to review this information the day before the test. ? ?You have been referred to the Pharmacy Clinic at [facility] for weight loss medications, they will call you to discuss ? ?Your physician recommends that you schedule a follow-up appointment in: 1 year (March 2024), **PLEASE CALL OUR OFFICE IN JAN 2024 TO SCHEDULE THIS APPOINTMENT ? ?If you have any questions or concerns before your next appointment please send [place] a message through [place] or call our office at [phone].   ? ?TO LEAVE A MESSAGE FOR THE NURSE SELECT OPTION 2, PLEASE LEAVE A MESSAGE INCLUDING: ?YOUR NAME ?DATE OF BIRTH ?CALL BACK NUMBER ?REASON FOR CALL**this is important as we prioritize the call backs ? ?YOU WILL RECEIVE A CALL BACK THE SAME DAY AS LONG AS YOU CALL BEFORE 4:00 PM ? ? ?

## 2021-05-27 NOTE — Addendum Note (Signed)
Encounter addended by: Scarlette Calico, RN on: 05/27/2021 12:20 PM ? Actions taken: Pharmacy for encounter modified, Order list changed, Diagnosis association updated, Allergies modified, Allergies reviewed, Clinical Note Signed

## 2021-05-28 ENCOUNTER — Telehealth: Payer: Self-pay | Admitting: Student-PharmD

## 2021-05-28 MED ORDER — SEMAGLUTIDE-WEIGHT MANAGEMENT 2.4 MG/0.75ML ~~LOC~~ SOAJ: 2.4000 mg | SUBCUTANEOUS | 11 refills | Status: DC

## 2021-05-28 MED ORDER — SEMAGLUTIDE-WEIGHT MANAGEMENT 0.5 MG/0.5ML ~~LOC~~ SOAJ: 0.5000 mg | SUBCUTANEOUS | 0 refills | Status: AC

## 2021-05-28 MED ORDER — SEMAGLUTIDE-WEIGHT MANAGEMENT 1.7 MG/0.75ML ~~LOC~~ SOAJ: 1.7000 mg | SUBCUTANEOUS | 0 refills | Status: DC

## 2021-05-28 MED ORDER — SEMAGLUTIDE-WEIGHT MANAGEMENT 1 MG/0.5ML ~~LOC~~ SOAJ: 1.0000 mg | SUBCUTANEOUS | 0 refills | Status: AC

## 2021-05-28 MED ORDER — SEMAGLUTIDE-WEIGHT MANAGEMENT 0.25 MG/0.5ML ~~LOC~~ SOAJ: 0.2500 mg | SUBCUTANEOUS | 0 refills | Status: DC

## 2021-05-28 NOTE — Telephone Encounter (Signed)
Received referral from Dr. Haroldine Laws to start semaglutide for this patient for weight loss. They meet FDA approved criteria for semaglutide for use in obesity given BMI 40.39. Obesity is complicated by chronic conditions including HTN, aflutter, HF, OSA. No contraindications seen in the chart to [facility] use.  ? ?Submitted PA for [facility] 05/28/21.  ?

## 2021-05-28 NOTE — Telephone Encounter (Signed)
[place] PA approved through 12/28/21.  ? ?Called patient to review administration, risks, and benefits of taking the medication. Confirmed no family history of MTC or MEN2. He is interested in starting treatment. Sent Rx to his pharmacy. Counseled that he can sign up for a savings card online to bring the cost to $0. Scheduled for pharmacy appt on Monday 3/27 at 8:30am.  ?

## 2021-05-30 ENCOUNTER — Telehealth (HOSPITAL_COMMUNITY): Payer: Self-pay | Admitting: *Deleted

## 2021-05-30 NOTE — Telephone Encounter (Signed)
No pre cert reqd for PET scan. ? ? ?

## 2021-06-03 ENCOUNTER — Ambulatory Visit (INDEPENDENT_AMBULATORY_CARE_PROVIDER_SITE_OTHER): Payer: BC Managed Care – PPO | Admitting: Pharmacist

## 2021-06-03 ENCOUNTER — Other Ambulatory Visit: Payer: Self-pay

## 2021-06-03 VITALS — Wt 341.0 lb

## 2021-06-03 DIAGNOSIS — E669 Obesity, unspecified: Secondary | ICD-10-CM | POA: Diagnosis not present

## 2021-06-03 NOTE — Progress Notes (Signed)
Patient ID: Harold Park                 DOB: March 17, 1965                    MRN: [id number] ? ? ? ? ?HPI: ?[name] is a 56 y.o. male patient referred to pharmacy clinic by Dr. Haroldine Laws to initiate weight loss therapy with GLP1-RA. PMH is significant for obesity complicated by chronic medical conditions including NICM with EF 65-68%, chronic systolic CHF, parox A-Flutter with RVR (01/2010), HTN, s/p splenectomy, s/p appendectomy and possible sarcoidosis. Most recent BMI 40.44. ? ?Patient presents to CVRR clinic. He states he has lost about 20lb by reducing the amount of times he goes to fast food to 2 days instead of 5 days per week. 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 Often is finding something fast and easy on the road. Fast food 5 days a week sometimes.  ? ?Current weight management medications: none ? ?Previously tried meds: none ? ?Current meds that may affect weight: torsemide ? ?Baseline weight/BMI: 341lb/40.4 ? ?Insurance payor: [place] ? ?Diet:  ?-Breakfast: bagel and cream cheese ?-Lunch: on the road- fast food ?-Dinner: meat vegetable and starch ?-Snacks: potato chips, ice cream sandwich ?-Drinks: water w/ a splash of OJ, soda at lunch ? ?Exercise: on his feet showing houses, yard work, no organized exercise ? ?Family History:  ?Family History  ?Problem Relation Age of Onset  ? Cancer Mother   ?     breast  ? Hypertension Other   ? Heart disease Other   ? ? ? ?Social History:  ?Social History  ? ?Socioeconomic History  ? Marital status: Married  ?  Spouse name: Not on file  ? Number of children: 3  ? Years of education: Not on file  ? Highest education level: Not on file  ?Occupational History  ? Occupation: realtor  ?Tobacco Use  ? Smoking status: Never  ? Smokeless tobacco: Never  ?Vaping Use  ? Vaping Use: Never used  ?Substance and Sexual Activity  ? Alcohol use: Yes  ?  Comment: occasional  ? Drug use: Never  ? Sexual activity: Not on file  ?Other Topics Concern  ? Not on file   ?Social History Narrative  ? Not on file  ? ?Social Determinants of Health  ? ?Financial Resource Strain: Not on file  ?Food Insecurity: Not on file  ?Transportation Needs: Not on file  ?Physical Activity: Not on file  ?Stress: Not on file  ?Social Connections: Not on file  ?Intimate Partner Violence: Not on file  ? ? ? ? ?Labs: ?Lab Results  ?Component Value Date  ? HGBA1C 5.8 (H) 02/02/2019  ? ? ?Wt Readings from Last 1 Encounters:  ?06/03/21 (!) 341 lb (154.7 kg)  ? ? ?BP Readings from Last 1 Encounters:  ?05/27/21 108/70  ? ?Pulse Readings from Last 1 Encounters:  ?05/27/21 75  ? ? ?   ?Component Value Date/Time  ? CHOL 158 02/02/2019 1045  ? TRIG 275 (H) 02/02/2019 1045  ? HDL 28 (L) 02/02/2019 1045  ? CHOLHDL 5.6 02/02/2019 1045  ? VLDL 55 (H) 02/02/2019 1045  ? [place] 75 02/02/2019 1045  ? ? ?Past Medical History:  ?Diagnosis Date  ? Chronic bronchitis   ? HTN (hypertension)   ? NICM (nonischemic cardiomyopathy) ([place])   ? Obesity (BMI 30-39.9) 12/04/2015  ? OSA (obstructive sleep apnea) 08/19/2015  ? Severe with AHI 64/hr now  on CPAP at 13cm H2O  ? PAF (paroxysmal atrial fibrillation) ([place])   ? Systolic CHF, chronic ([place])   ? ? ?Current Outpatient Medications on File Prior to Visit  ?Medication Sig Dispense Refill  ? albuterol (VENTOLIN HFA) 108 (90 Base) MCG/ACT inhaler Inhale 2 puffs into the lungs every 6 (six) hours as needed for wheezing or shortness of breath. 8 g 6  ? carvedilol (COREG) 25 MG tablet Take 25 mg by mouth daily in the afternoon.    ? DULoxetine (CYMBALTA) 20 MG capsule Take 1 capsule (20 mg total) by mouth daily. 90 capsule 3  ? ELIQUIS 5 MG TABS tablet TAKE 1 TABLET (5 MG TOTAL) BY MOUTH 2 (TWO) TIMES DAILY. PATIENT NEEDS TO SCHEDULE F/U APPT 60 tablet 0  ? eplerenone (INSPRA) 25 MG tablet Take 1 tablet (25 mg total) by mouth daily. 30 tablet 11  ? ibuprofen (ADVIL,MOTRIN) 200 MG tablet Take 800 mg by mouth every 8 (eight) hours as needed for mild pain or moderate pain.    ? KLOR-CON  M20 20 MEQ tablet TAKE 4 TABLETS BY MOUTH EVERY MORNING AND 2 TABLETS DAILY W/ LUNCH AND 4 TABS EVERY EVENING. 900 tablet 1  ? methocarbamol (ROBAXIN) 500 MG tablet Take 500 mg by mouth 2 (two) times daily as needed for muscle spasms.     ? metolazone (ZAROXOLYN) 5 MG tablet Take 1 tablet (5 mg total) by mouth daily as needed (Rapid weight gain). Take only as directed by the heart failure clinic    ? NEEDLE, DISP, 22 G 22G X 1" MISC Use to inject testosterone into the muscle once every 14 days. 6 each 3  ? ranolazine (RANEXA) 500 MG 12 hr tablet TAKE 1 TABLET (500 MG TOTAL) BY MOUTH 2 (TWO) TIMES DAILY. 180 tablet 3  ? sacubitril-valsartan (ENTRESTO) 97-103 MG Take 1 tablet by mouth 2 (two) times daily.    ? Semaglutide-Weight Management 0.25 MG/0.5ML SOAJ Inject 0.25 mg into the skin once a week for 28 days. 2 mL 0  ? [START ON 06/26/2021] Semaglutide-Weight Management 0.5 MG/0.5ML SOAJ Inject 0.5 mg into the skin once a week for 28 days. 2 mL 0  ? [START ON 07/25/2021] Semaglutide-Weight Management 1 MG/0.5ML SOAJ Inject 1 mg into the skin once a week for 28 days. 2 mL 0  ? [START ON 08/23/2021] Semaglutide-Weight Management 1.7 MG/0.75ML SOAJ Inject 1.7 mg into the skin once a week for 28 days. 3 mL 0  ? [START ON 09/21/2021] Semaglutide-Weight Management 2.4 MG/0.75ML SOAJ Inject 2.4 mg into the skin once a week. 3 mL 11  ? sildenafil (VIAGRA) 100 MG tablet Take 1 tablet (100 mg total) by mouth as needed for erectile dysfunction.    ? torsemide (DEMADEX) 20 MG tablet TAKE 1 TABLET BY MOUTH EVERY DAY 90 tablet 3  ? ?No current facility-administered medications on file prior to visit.  ? ? ?Allergies  ?Allergen Reactions  ? Amiodarone Nausea Only  ? Spironolactone   ? ? ? ?Assessment/Plan: ? ?1. Weight loss - Patient has not met goal of at least 5% of body weight loss with comprehensive lifestyle modifications alone in the past 3-6 months. Pharmacotherapy is appropriate to pursue as augmentation. Will start [place]  0.45m weekly. Confirmed patient not pregnant and no personal or family history of medullary thyroid carcinoma (MTC) or Multiple Endocrine Neoplasia syndrome type 2 (MEN 2).  ? ?Advised patient on common side effects including nausea, diarrhea, dyspepsia, decreased appetite, and fatigue. Counseled patient  on reducing meal size and how to titrate medication to minimize side effects. Counseled patient to call if intolerable side effects or if experiencing dehydration, abdominal pain, or dizziness. Patient will adhere to dietary modifications and will target at least 150 minutes of moderate intensity exercise weekly.  ? ?Injection technique reviewed at today's visit. Pharmacy dispensed the 1.65m dose to patient, therefore patient did not give first injection in clinic. Patient was asked to request the pharmacy fill the 0.215mdose. He is to call me if he has any issues. ? ?Encouraged patient to stop eating fast food. Gave him some ideas of lunches and snacks he can keep with him to avoid making the quick, unhealthy choice. Encouraged him to start walking daily. ? ?Titration Plan:  ?Will plan to follow the titration plan as below, pending patient is tolerating each dose before increasing to the next. Can slow titration if needed for tolerability.  ?  ?-Month 1: Inject 0.2542mQ once weekly x 4 weeks ?-Month 2: Inject 0.5mg23m once weekly x 4 weeks ?-Month 3: Inject 1mg 26monce weekly x 4 weeks ?-Month 4: Inject 1.7mg S48mnce weekly x 4 weeks ?-Month 5+ Inject 2.4mg  S18mnce weekly ? ? ?Follow up in 1 month via telephone.  ? ?Thank you,  ? ?MelissaRamond Dial.D, BCPS, CPP ?[id number]  [address]e: [phone]

## 2021-06-03 NOTE — Patient Instructions (Signed)
Please ask your pharmacy to fill the 0.'25mg'$  dose ? ?GLP-1 Receptor Agonist Counseling Points ?This medication reduces your appetite and may make you feel fuller longer.  ?Stop eating when your body tells you that you are full. This will likely happen sooner than you are used to. ?Store your medication in the fridge until you are ready to use it. ?Inject your medication in the fatty tissue of your lower abdominal area (2 inches away from belly button) or upper outer thigh. Rotate injection sites. ?Each pen will last you about 1 month (the first month it will last a few weeks longer). Use a different needle with each weekly injection. ?Common side effects include: nausea, diarrhea/constipation, and heartburn, and are more likely to occur if you overeat. ? ?Dosing schedule: ?- Month 1: Inject 0.25 subcutaneously once weekly for 4 weeks ?- Month 2: Inject 0.5 subcutaneously once weekly for 4 weeks ?- Month 3: Inject 1 subcutaneously once weekly for 4 weeks ?- Month 4: Inject 1.7 subcutaneously once weekly for 4 weeks ?- Month 5: Inject 2.4 subcutaneously once weekly  ? ?[email or web address] ? ?Tips for living a healthier life ? ? ? ? ?Building a Naval architect Diet ?Make most of your meal vegetables and fruits - ? of your plate. ?Aim for color and variety, and remember that potatoes don?t count as vegetables on the Healthy Eating Plate because of their negative impact on blood sugar. ? ?Go for whole grains - ? of your plate. ?Whole and intact grains--whole wheat, barley, wheat berries, quinoa, oats, brown rice, and foods made with them, such as whole wheat pasta--have a milder effect on blood sugar and insulin than white bread, white rice, and other refined grains. ? ?Protein power - ? of your plate. ?Fish, poultry, beans, and nuts are all healthy, versatile protein sources--they can be mixed into salads, and pair well with vegetables on a  plate. Limit red meat, and avoid processed meats such as bacon and sausage. ? ?Healthy plant oils - in moderation. ?Choose healthy vegetable oils like olive, canola, soy, corn, sunflower, peanut, and others, and avoid partially hydrogenated oils, which contain unhealthy trans fats. Remember that low-fat does not mean ?healthy.? ? ?Drink water, coffee, or tea. ?Skip sugary drinks, limit milk and dairy products to one to two servings per day, and limit juice to a small glass per day. ? ?Stay active. ?The red figure running across the [place] is a reminder that staying active is also important in weight control. ? ?The main message of the Healthy Eating Plate is to focus on diet quality: ? ?The type of carbohydrate in the diet is more important than the amount of carbohydrate in the diet, because some sources of carbohydrate--like vegetables (other than potatoes), fruits, whole grains, and beans--are healthier than others. ?The Healthy Eating Plate also advises consumers to avoid sugary beverages, a major source of calories--usually with little nutritional value--in the American diet. ?The Healthy Eating Plate encourages consumers to use healthy oils, and it does not set a maximum on the percentage of calories people should get each day from healthy sources of fat. In this way, the Healthy Eating Plate recommends the opposite of the low-fat message promoted for decades by the USDA. ? ?DeskDistributor.no ? ?SUGAR ? ?Sugar is a huge problem in the modern day diet. Sugar is a big contributor to heart disease, diabetes, high triglyceride levels, fatty liver disease and obesity. Sugar is hidden in almost all packaged foods/beverages. Added sugar is extra  sugar that is added beyond what is naturally found and has no nutritional benefit for your body. The American Heart Association recommends limiting added sugars to no more than 25g for women and 36  grams for men per day. There are many names for sugar including maltose, sucrose (names ending in "ose"), high fructose corn syrup, molasses, cane sugar, corn sweetener, raw sugar, syrup, honey or fruit juice concentrate.  ? ?One of the best ways to limit your added sugars is to stop drinking sweetened beverages such as soda, sweet tea, and fruit juice. ? ?There is 65g of added sugars in one 20oz bottle of Coke! That is equal to 7.5 donuts.  ? ?Pay attention and read all nutrition facts labels. Below is an examples of a nutrition facts label. The #1 is showing you the total sugars where the # 2 is showing you the added sugars. This one serving has almost the max amount of added sugars per day! ? ? ? ? ?20 oz Soda ?65g Sugar = 7.5 Glazed Donuts ? ?16oz Energy  ?Drink ?54g Sugar = 6.5 Glazed Donuts ? ?Large Sweet  ?Tea ?38g Sugar = 4 Glazed Donuts ? ?20oz Sports  ?Drink ?34g Sugar = 3.5 Glazed Donuts ? ?8oz Chocolate Milk ?24g Sugar =2.5 Glazed Donuts ? ?8oz Orange  ?Juice ?21g Sugar = 2 Glazed Donuts ? ?1 Juice Box ?14g Sugar = 1.5 Glazed Donuts ? ?16oz Water= NO SUGAR!! ? ?EXERCISE ? ?Exercise is good. We?ve all heard that. In an ideal world, we would all have time and resources to get plenty of it. When you are active, your heart pumps more efficiently and you will feel better.  Multiple studies show that even walking regularly has benefits that include living a longer life. The American Heart Association recommends 150 minutes per week of exercise (30 minutes per day most days of the week). You can do this in any increment you wish. Nine or more 10-minute walks count. So does an hour-long exercise class. Break the time apart into what will work in your life. Some of the best things you can do include walking briskly, jogging, cycling or swimming laps. Not everyone is ready to ?exercise.? Sometimes we need to start with just getting active. Here are some easy ways to be more active throughout the day: ? Take the  stairs instead of the elevator ? Go for a 10-15 minute walk during your lunch break (find a friend to make it more enjoyable) ? When shopping, park at the back of the parking lot ? If you take public transportation, get off one stop early and walk the extra distance ? Pace around while making phone calls ? ?Check with your doctor if you aren?t sure what your limitations may be. Always remember to drink plenty of water when doing any type of exercise. Don?t feel like a failure if you?re not getting the 90-150 minutes per week. If you started by being a couch potato, then just a 10-minute walk each day is a huge improvement. Start with little victories and work your way up. ? ? ?HEALTHY EATING TIPS ? ?When looking to improve your eating habits, whether to lose weight, lower blood pressure or just be healthier, it helps to know what a serving size is.  ? ?Grains ?1 slice of bread, ? bagel, ? cup pasta or rice  Vegetables ?1 cup fresh or raw vegetables, ? cup cooked or canned ?Fruits ?1 piece of medium sized fruit, ? cup canned,  Meats/Proteins ?? cup dried       1 oz meat, 1 egg, ? cup cooked beans, nuts or seeds ? ?Dairy        Fats ?Individual yogurt container, 1 cup (8oz)    1 teaspoon margarine/butter or vegetable  ?milk or milk alternative, 1 slice of cheese          oil; 1 tablespoon mayonnaise or salad dressing                 ? ?Plan ahead: make a menu of the meals for a week then create a grocery list to go with that menu. Consider meals that easily stretch into a night of leftovers, such as stews or casseroles. Or consider making two of your favorite meal and put one in the freezer for another night. Try a night or two each week that is ?meatless? or ?no cook? such as salads. When you get home from the grocery store wash and prepare your vegetables and fruits. Then when you need them they are ready to go.  ? ?Tips for going to the grocery store: ? [place] store or generic brands ? Check the weekly ad from your  store on-line or in their in-store flyer ? Look at the unit price on the shelf tag to compare/contrast the costs of different items ? Buy fruits/vegetables in season ? Carrots, bananas and apples are low-cost,

## 2021-06-04 ENCOUNTER — Telehealth: Payer: Self-pay | Admitting: Pharmacist

## 2021-06-04 MED ORDER — SEMAGLUTIDE-WEIGHT MANAGEMENT 0.25 MG/0.5ML ~~LOC~~ SOAJ: 0.2500 mg | SUBCUTANEOUS | 0 refills | Status: AC

## 2021-06-04 NOTE — Telephone Encounter (Signed)
Patient's pharmacy dispensed the wrong dose of Wegovy to start.  ?Will give pt sample of 0.'25mg'$  to get his started. He will pick this up from the [place] office. ?

## 2021-06-04 NOTE — Telephone Encounter (Signed)
Patient already contacted. thanks ?

## 2021-06-04 NOTE — Telephone Encounter (Signed)
The sample has been placed at the [facility] office I will route back to dr. Prentiss Bells to make sure I don't need to call the pt ?

## 2021-06-07 ENCOUNTER — Telehealth: Payer: Self-pay

## 2021-06-07 ENCOUNTER — Ambulatory Visit (INDEPENDENT_AMBULATORY_CARE_PROVIDER_SITE_OTHER): Payer: BC Managed Care – PPO | Admitting: Gastroenterology

## 2021-06-07 ENCOUNTER — Encounter: Payer: Self-pay | Admitting: Gastroenterology

## 2021-06-07 VITALS — BP 124/80 | HR 80 | Ht 77.0 in | Wt 339.0 lb

## 2021-06-07 DIAGNOSIS — I4892 Unspecified atrial flutter: Secondary | ICD-10-CM

## 2021-06-07 DIAGNOSIS — I5022 Chronic systolic (congestive) heart failure: Secondary | ICD-10-CM | POA: Diagnosis not present

## 2021-06-07 DIAGNOSIS — Z1212 Encounter for screening for malignant neoplasm of rectum: Secondary | ICD-10-CM

## 2021-06-07 DIAGNOSIS — Z9229 Personal history of other drug therapy: Secondary | ICD-10-CM | POA: Diagnosis not present

## 2021-06-07 DIAGNOSIS — Z1211 Encounter for screening for malignant neoplasm of colon: Secondary | ICD-10-CM | POA: Diagnosis not present

## 2021-06-07 MED ORDER — NA SULFATE-K SULFATE-MG SULF 17.5-3.13-1.6 GM/177ML PO SOLN: 1.0000 | Freq: Once | ORAL | 0 refills | Status: AC

## 2021-06-07 NOTE — Patient Instructions (Signed)
If you are age 57 or older, your body mass index should be between 23-30. Your Body mass index is 40.2 kg/m?Marland Kitchen If this is out of the aforementioned range listed, please consider follow up with your Primary Care Provider. ? ?If you are age 60 or younger, your body mass index should be between 19-25. Your Body mass index is 40.2 kg/m?Marland Kitchen If this is out of the aformentioned range listed, please consider follow up with your Primary Care Provider.  ? ?You have been scheduled for a colonoscopy. Please follow written instructions given to you at your visit today.  ?Please pick up your prep supplies at the pharmacy within the next 1-3 days. ?If you use inhalers (even only as needed), please bring them with you on the day of your procedure. ? ?The [facility] providers would like to encourage you to use [facility] to communicate with providers for non-urgent requests or questions.  Due to long hold times on the telephone, sending your provider a message by [facility] may be a faster and more efficient way to get a response.  Please allow 48 business hours for a response.  Please remember that this is for non-urgent requests.  ? ?It was a pleasure to see you today! ? ?Thank you for trusting me with your gastrointestinal care!   ? ?Scott E.Cunning, MD  ? ?

## 2021-06-07 NOTE — Telephone Encounter (Signed)
Patient with diagnosis of afib on Eliquis for anticoagulation.   ? ?Procedure: colonoscopy ?Date of procedure: 07/16/21 ? ?CHA2DS2-VASc Score = 2  ?This indicates a 2.2% annual risk of stroke. ?The patient's score is based upon: ?CHF History: 1 ?HTN History: 1 ?Diabetes History: 0 ?Stroke History: 0 ?Vascular Disease History: 0 ?Age Score: 0 ?Gender Score: 0 ?  ?CrCl >149m/min ?Platelet count 233K ? ?Per office protocol, patient can hold Eliquis for 2 days prior to procedure as requested.   ?

## 2021-06-07 NOTE — Progress Notes (Signed)
? ?HPI : Harold Park is a very pleasant 56 year old male with a history of atrial fibrillation, systolic heart failure, obstructive sleep apnea who is referred to [place] by Dr. Carolann Littler for initial average risk screening colonoscopy.  The patient denies any chronic GI symptoms.  He does report having hemorrhoidal bleeding several years ago with some perianal discomfort, but this resolved.  He currently has regular bowel movements which are formed and soft.  No straining, no hard stools.  Last time he saw any blood in his stool was about a year ago.  No history of colon cancer. ? ?The etiology of his systolic heart failure is unclear.  His ejection fraction has been as low as 25-30%.  His most recent echo in Dec 2021 showed an EF of 40-45%.  His cardiologist has ordered a repeat TTE which is scheduled for next week.  He reports chronic symptoms of fatigue but denies chest pain/pressure, light-headedness/dizziness, palpitations, orthopnea or leg swelling.  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   ?He has never had problems with rate control/RVR since he was diagnosed 6 years ago. ? ?He had a coronary catheterization in 2021 which did not demonstrate any CAD. ? ?Past Medical History:  ?Diagnosis Date  ? Chronic bronchitis   ? HTN (hypertension)   ? NICM (nonischemic cardiomyopathy) ([place])   ? Obesity (BMI 30-39.9) 12/04/2015  ? OSA (obstructive sleep apnea) 08/19/2015  ? Severe with AHI 64/hr now on CPAP at 13cm H2O  ? PAF (paroxysmal atrial fibrillation) ([place])   ? Systolic CHF, chronic ([place])   ? ? ? ?Past Surgical History:  ?Procedure Laterality Date  ? APPENDECTOMY    ? BRONCHIAL WASHINGS  04/24/2020  ? Procedure: BRONCHIAL WASHINGS;  Surgeon: Collene Gobble, MD;  Location: [facility] ENDOSCOPY;  Service: Pulmonary;;  ? CARDIAC CATHETERIZATION    ? CARDIOVERSION N/A 06/19/2017  ? Procedure: CARDIOVERSION;  Surgeon: Jolaine Artist, MD;  Location: [place];  Service: Cardiovascular;  Laterality: N/A;  ? FINE NEEDLE ASPIRATION  04/24/2020  ? Procedure: FINE NEEDLE ASPIRATION (FNA) LINEAR;  Surgeon: Collene Gobble, MD;  Location: [facility] ENDOSCOPY;  Service: Pulmonary;;  ? LIPOMA RESECTION    ? arms  ? SPLENECTOMY    ? SPLIT NIGHT STUDY  08/12/2015  ? VIDEO BRONCHOSCOPY WITH ENDOBRONCHIAL ULTRASOUND N/A 04/24/2020  ? Procedure: VIDEO BRONCHOSCOPY WITH ENDOBRONCHIAL ULTRASOUND;  Surgeon: Collene Gobble, MD;  Location[facility] ENDOSCOPY;  Service: Pulmonary;  Laterality: N/A;  ? ?Family History  ?Problem Relation Age of Onset  ? Breast cancer Mother   ? Cancer Father   ?     blood cancer  ? Hypertension Other   ? Heart disease Other   ? Stomach cancer Neg Hx   ? Colon cancer Neg Hx   ? Esophageal cancer Neg Hx   ? Pancreatic cancer Neg Hx   ? ?Social History  ? ?Tobacco Use  ? Smoking status: Never  ? Smokeless tobacco: Never  ?Vaping Use  ? Vaping Use: Never used  ?Substance Use Topics  ? Alcohol use: Yes  ?  Comment: occasional  ? Drug use: Never  ? ?Current Outpatient Medications  ?Medication Sig Dispense Refill  ? albuterol (VENTOLIN HFA) 108 (90 Base) MCG/ACT inhaler Inhale 2 puffs into the lungs every 6 (six) hours as needed for wheezing or shortness of breath. 8 g 6  ? carvedilol (COREG) 25  MG tablet Take 25 mg by mouth daily in the afternoon.    ? DULoxetine (CYMBALTA) 20 MG capsule Take 1 capsule (20 mg total) by mouth daily. 90 capsule 3  ? ELIQUIS 5 MG TABS tablet TAKE 1 TABLET (5 MG TOTAL) BY MOUTH 2 (TWO) TIMES DAILY. PATIENT NEEDS TO SCHEDULE F/U APPT 60 tablet 0  ? eplerenone (INSPRA) 25 MG tablet Take 1 tablet (25 mg total) by mouth daily. 30 tablet 11  ? ibuprofen (ADVIL,MOTRIN) 200 MG tablet Take 800 mg by mouth every 8 (eight) hours as needed for mild pain or moderate pain.    ? KLOR-CON M20 20 MEQ tablet TAKE 4 TABLETS BY MOUTH EVERY MORNING AND 2 TABLETS DAILY W/ LUNCH AND 4 TABS EVERY EVENING. 900 tablet 1  ? methocarbamol  (ROBAXIN) 500 MG tablet Take 500 mg by mouth 2 (two) times daily as needed for muscle spasms.     ? metolazone (ZAROXOLYN) 5 MG tablet Take 1 tablet (5 mg total) by mouth daily as needed (Rapid weight gain). Take only as directed by the heart failure clinic    ? NEEDLE, DISP, 22 G 22G X 1" MISC Use to inject testosterone into the muscle once every 14 days. 6 each 3  ? ranolazine (RANEXA) 500 MG 12 hr tablet TAKE 1 TABLET (500 MG TOTAL) BY MOUTH 2 (TWO) TIMES DAILY. 180 tablet 3  ? sacubitril-valsartan (ENTRESTO) 97-103 MG Take 1 tablet by mouth 2 (two) times daily.    ? Semaglutide-Weight Management 0.25 MG/0.5ML SOAJ Inject 0.25 mg into the skin once a week for 28 days. 2 mL 0  ? sildenafil (VIAGRA) 100 MG tablet Take 1 tablet (100 mg total) by mouth as needed for erectile dysfunction.    ? torsemide (DEMADEX) 20 MG tablet TAKE 1 TABLET BY MOUTH EVERY DAY 90 tablet 3  ? [START ON 06/26/2021] Semaglutide-Weight Management 0.5 MG/0.5ML SOAJ Inject 0.5 mg into the skin once a week for 28 days. (Patient not taking: Reported on 06/07/2021) 2 mL 0  ? [START ON 07/25/2021] Semaglutide-Weight Management 1 MG/0.5ML SOAJ Inject 1 mg into the skin once a week for 28 days. (Patient not taking: Reported on 06/07/2021) 2 mL 0  ? [START ON 08/23/2021] Semaglutide-Weight Management 1.7 MG/0.75ML SOAJ Inject 1.7 mg into the skin once a week for 28 days. (Patient not taking: Reported on 06/07/2021) 3 mL 0  ? [START ON 09/21/2021] Semaglutide-Weight Management 2.4 MG/0.75ML SOAJ Inject 2.4 mg into the skin once a week. (Patient not taking: Reported on 06/07/2021) 3 mL 11  ? ?No current facility-administered medications for this visit.  ? ?Allergies  ?Allergen Reactions  ? Amiodarone Nausea Only  ? Spironolactone   ? ? ? ?Review of Systems: ?All systems reviewed and negative except where noted in HPI.  ? ? ?CT ABDOMEN PELVIS W CONTRAST ? ?Result Date: 05/20/2021 ?CLINICAL DATA:  Incisional hernia umbilical area EXAM: CT ABDOMEN AND PELVIS WITH  CONTRAST TECHNIQUE: Multidetector CT imaging of the abdomen and pelvis was performed using the standard protocol following bolus administration of intravenous contrast. RADIATION DOSE REDUCTION: This exam was performed according to the departmental dose-optimization program which includes automated exposure control, adjustment of the mA and/or kV according to patient size and/or use of iterative reconstruction technique. CONTRAST:  151m ISOVUE-300 IOPAMIDOL (ISOVUE-300) INJECTION 61% COMPARISON:  None. FINDINGS: Lower chest: No acute abnormality Hepatobiliary: No focal hepatic abnormality. Gallbladder unremarkable. Pancreas: No focal abnormality or ductal dilatation. Spleen: Prior splenectomy. Adrenals/Urinary Tract: Large cyst in the  lower pole of the right kidney measuring 10 cm. 2.3 cm left midpole renal cyst. No stones or hydronephrosis. Adrenal glands and urinary bladder unremarkable. Stomach/Bowel: Scattered colonic diverticulosis. No active diverticulitis. Stomach, large and small bowel grossly unremarkable. Vascular/Lymphatic: Aorta normal caliber. Shotty retroperitoneal lymph nodes, none pathologically enlarged. Reproductive: No visible focal abnormality. Other: No free fluid or free air.  No visible hernia. Musculoskeletal: No acute bony abnormality. Degenerative changes in the lumbar spine. IMPRESSION: No visible ventral wall hernia. No acute findings in the abdomen or pelvis. Bilateral renal cysts. Scattered colonic diverticulosis. Electronically Signed   By: Rolm Baptise M.D.   On: 05/20/2021 23:46   ? ?Physical Exam: ?Ht '6\' 5"'$  (1.956 m)   Wt (!) 339 lb (153.8 kg)   BMI 40.20 kg/m?  ?Constitutional: Pleasant,well-developed, Caucasian male in no acute distress. ?HEENT: Normocephalic and atraumatic. Conjunctivae are normal. No scleral icterus. ?Neck supple.  ?Cardiovascular: Normal rate, regular rhythm.  ?Pulmonary/chest: Effort normal and breath sounds normal. No wheezing, rales or  rhonchi. ?Abdominal: Soft, nondistended, nontender. Bowel sounds active throughout. There are no masses palpable. No hepatomegaly. ?Extremities: no edema ?Neurological: Alert and oriented to person place and time. ?Skin: Skin

## 2021-06-07 NOTE — Telephone Encounter (Signed)
[facility] Pre-operative Risk Assessment  ?   ?Request for surgical clearance:     Endoscopy Procedure ? ?What type of surgery is being performed?     Colonoscopy  ? ?When is this surgery scheduled?     07/16/21 ? ?What type of clearance is required ?   Pharmacy ? ?Are there any medications that need to be held prior to surgery and how long? Eliquis 2 days  ? ?Practice name and name of physician performing surgery?      [facility] ? ?What is your office phone and fax number?      Phone- [phone]  Fax- [phone] ? ?Anesthesia type (None, local, MAC, general) ?       [facility]  ?

## 2021-06-07 NOTE — Telephone Encounter (Signed)
? ?  Primary Cardiologist: Fransico Him, MD ? ?Chart reviewed as part of pre-operative protocol coverage. Given past medical history and time since last visit, based on ACC/AHA guidelines, Jaishaun Mcnab would be at acceptable risk for the planned procedure without further cardiovascular testing.  ? ?Patient with diagnosis of afib on Eliquis for anticoagulation.   ?  ?Procedure: colonoscopy ?Date of procedure: 07/16/21 ?  ?CHA2DS2-VASc Score = 2  ?This indicates a 2.2% annual risk of stroke. ?The patient's score is based upon: ?CHF History: 1 ?HTN History: 1 ?Diabetes History: 0 ?Stroke History: 0 ?Vascular Disease History: 0 ?Age Score: 0 ?Gender Score: 0 ?  ?CrCl >174m/min ?Platelet count 233K ?  ?Per office protocol, patient can hold Eliquis for 2 days prior to procedure as requested. ? ?I will route this recommendation to the requesting party via Epic fax function and remove from pre-op pool. ? ?Please call with questions. ? ?JJossie Ng  NP-C ? ?  ?06/07/2021, 3:10 PM ?[id number] ?Office [phone] ([phone]? ? ? ? ?

## 2021-06-10 ENCOUNTER — Other Ambulatory Visit: Payer: BC Managed Care – PPO

## 2021-06-11 DIAGNOSIS — K432 Incisional hernia without obstruction or gangrene: Secondary | ICD-10-CM | POA: Diagnosis not present

## 2021-06-12 ENCOUNTER — Other Ambulatory Visit (INDEPENDENT_AMBULATORY_CARE_PROVIDER_SITE_OTHER): Payer: BC Managed Care – PPO

## 2021-06-12 ENCOUNTER — Telehealth: Payer: Self-pay

## 2021-06-12 ENCOUNTER — Telehealth: Payer: Self-pay | Admitting: *Deleted

## 2021-06-12 DIAGNOSIS — R718 Other abnormality of red blood cells: Secondary | ICD-10-CM

## 2021-06-12 DIAGNOSIS — R7989 Other specified abnormal findings of blood chemistry: Secondary | ICD-10-CM

## 2021-06-12 DIAGNOSIS — D751 Secondary polycythemia: Secondary | ICD-10-CM

## 2021-06-12 LAB — CBC WITH DIFFERENTIAL/PLATELET
Basophils Absolute: 0.1 10*3/uL (ref 0.0–0.1)
Basophils Relative: 0.8 % (ref 0.0–3.0)
Eosinophils Absolute: 0.5 10*3/uL (ref 0.0–0.7)
Eosinophils Relative: 4.6 % (ref 0.0–5.0)
HCT: 59 % — ABNORMAL HIGH (ref 39.0–52.0)
Hemoglobin: 19.6 g/dL (ref 13.0–17.0)
Lymphocytes Relative: 18.5 % (ref 12.0–46.0)
Lymphs Abs: 2 10*3/uL (ref 0.7–4.0)
MCHC: 33.2 g/dL (ref 30.0–36.0)
MCV: 87.9 fl (ref 78.0–100.0)
Monocytes Absolute: 1 10*3/uL (ref 0.1–1.0)
Monocytes Relative: 9 % (ref 3.0–12.0)
Neutro Abs: 7.2 10*3/uL (ref 1.4–7.7)
Neutrophils Relative %: 67.1 % (ref 43.0–77.0)
Platelets: 212 10*3/uL (ref 150.0–400.0)
RBC: 6.72 Mil/uL — ABNORMAL HIGH (ref 4.22–5.81)
RDW: 15.9 % — ABNORMAL HIGH (ref 11.5–15.5)
WBC: 10.8 10*3/uL — ABNORMAL HIGH (ref 4.0–10.5)

## 2021-06-12 NOTE — Telephone Encounter (Signed)
Patient with diagnosis of paroxysmal atrial fibrillation on Eliquis 5 mg BID for anticoagulation.   ? ?Procedure: Ventral Hernia Repair ?Date of procedure: TBD ? ? ?CHA2DS2-VASc Score = 2  ?This indicates a 2.2% annual risk of stroke. ?The patient's score is based upon: ?CHF History: 1 ?HTN History: 1 ?Diabetes History: 0 ?Stroke History: 0 ?Vascular Disease History: 0 ?Age Score: 0 ?Gender Score: 0 ? ?CrCl >100 mL/min (last SCr 09/20/2020) ?Platelet count 212 (06/12/2021) ? ?Per office protocol, patient can hold Eliquis for 2-3 days prior to procedure.  ?

## 2021-06-12 NOTE — Telephone Encounter (Signed)
? ?  Patient Name: Harold Park  ?DOB: [birth year] ?MRN: [id number] ? ?Primary Cardiologist: Fransico Him, MD ? ?Chart reviewed as part of pre-operative protocol coverage.  ? ?Dr. Valentino Nose are listed as primary cardiologist, for this patient, however, patient following most recenlty with Dr. Haroldine Laws in the advanced heart failure clinic.  ? ?56 year old male with a history of an ICM, EF 30 to 97%, chronic systolic heart failure, paroxysmal atrial flutter with RVR on Eliquis, moderate pulmonary hypertension, aortic root dilation, hypertension, OSA, frequent PVCs, pulmonary sarcoidosis, and severe OSA. ? ?Echo in December 2021 showed EF 40 to 45%. Coronary CTA in 02/2020 showed calcium score of 0.  ? ?Patient was last seen in clinic Dr. Haroldine Laws on 05/27/2021 and reported increased fatigue. Repeat echocardiogram was scheduled, and is pending. PET scan was arranged at [facility], also pending.  He has followed in the A-fib clinic, and has been told he needs to lose weight prior to consideration of AF ablation. ? ?Our office received request for preoperative clearance for ventral hernia repair, date TBD. ? ?Spoke with patient on phone, he denies any new symptoms since last outpatient visit.  Pharmacy recommends holding Eliquis 3 2 to 3 days prior to procedure. Dr. Haroldine Laws, please advise on medical clearance for this patient. ? ?Please forward your response to [facility] PREOP. ? ?Thank you.  ? ?Lenna Sciara, NP ?06/12/2021, 1:18 PM ?  ?

## 2021-06-12 NOTE — Telephone Encounter (Signed)
CRITICAL VALUE STICKER ? ?CRITICAL VALUE:  Hemoglobin 19.6   ? ?RECEIVER (on-site recipient of call):  G, CMA  ? ?DATE & TIME NOTIFIED: 10:33 AM 06/12/2021 ? ?MESSENGER (representative from lab): Santiago Glad ? ?MD NOTIFIED: Via Epic and verbally ? ?TIME OF NOTIFICATION: 10:33 AM  ? ?RESPONSE:   ?

## 2021-06-12 NOTE — Telephone Encounter (Signed)
? ?  Pre-operative Risk Assessment  ?  ?Patient Name: Harold Park  ?DOB: [birth year] ?MRN: [id number]  ? ?  ? ?Request for Surgical Clearance   ? ?Procedure:   VENTRAL HERNIA REPAIR ? ?Date of Surgery:  Clearance TBD                              ?   ?Surgeon:   ?Surgeon's Group or Practice Name:  [facility] ?Phone number:  [phone] ?Fax number:  [phone] ?  ?Type of Clearance Requested:   ?- Pharmacy:  Hold Apixaban (Eliquis) NOT INDICATED ?  ?Type of Anesthesia:  General  ?  ?Additional requests/questions:   ? ?Signed, ?Jeanann Lewandowsky   ?06/12/2021, 7:03 AM  ? ?

## 2021-06-12 NOTE — Telephone Encounter (Signed)
Pt had a lab appt for blood draw this morning for CBC pt requested for Testosterone labs to be ordered. Lab technician has already drawn blood and just need ordered placed if necessary.  ?

## 2021-06-13 ENCOUNTER — Telehealth: Payer: Self-pay | Admitting: Family Medicine

## 2021-06-13 ENCOUNTER — Ambulatory Visit (HOSPITAL_COMMUNITY)
Admission: RE | Admit: 2021-06-13 | Discharge: 2021-06-13 | Disposition: A | Discharge status: Home / Self Care | Payer: BC Managed Care – PPO | Source: Ambulatory Visit | Attending: Family Medicine | Admitting: Family Medicine

## 2021-06-13 ENCOUNTER — Other Ambulatory Visit: Payer: Self-pay

## 2021-06-13 DIAGNOSIS — I5022 Chronic systolic (congestive) heart failure: Secondary | ICD-10-CM | POA: Insufficient documentation

## 2021-06-13 LAB — ECHOCARDIOGRAM COMPLETE
Area-P 1/2: 4.41 cm2
S' Lateral: 4.7 cm

## 2021-06-13 MED ORDER — PERFLUTREN LIPID MICROSPHERE
1.0000 mL | INTRAVENOUS | Status: AC | PRN
  Administered 2021-06-13: 2 mL via INTRAVENOUS

## 2021-06-13 NOTE — Telephone Encounter (Signed)
? ? ?  Patient Name: Harold Park  ?DOB: 1965/11/18 ?MRN: [id number] ? ?Primary Cardiologist: Fransico Him, MD ? ?Chart reviewed as part of pre-operative protocol coverage. Given past medical history and time since last visit, based on ACC/AHA guidelines, [name] would be at acceptable risk for the planned procedure without further cardiovascular testing.   ? ?The patient was advised that if he develops new symptoms prior to surgery to contact our office to arrange for a follow-up visit, and he verbalized understanding. ? ?Patient with diagnosis of paroxysmal atrial fibrillation on Eliquis 5 mg BID for anticoagulation.   ?  ?Procedure: Ventral Hernia Repair ?Date of procedure: TBD ?  ?  ?CHA2DS2-VASc Score = 2  ?This indicates a 2.2% annual risk of stroke. ?The patient's score is based upon: ?CHF History: 1 ?HTN History: 1 ?Diabetes History: 0 ?Stroke History: 0 ?Vascular Disease History: 0 ?Age Score: 0 ?Gender Score: 0 ?  ?CrCl >100 mL/min (last SCr 09/20/2020) ?Platelet count 212 (06/12/2021) ?  ?Per office protocol, patient can hold Eliquis for 2-3 days prior to procedure. ? ?I will route this recommendation to the requesting party via Epic fax function and remove from pre-op pool. ? ?Please call with questions. ? ?Lenna Sciara, NP ?06/13/2021, 10:24 AM ? ?

## 2021-06-13 NOTE — Addendum Note (Signed)
Addended by: Otilio Miu on: 06/13/2021 12:05 PM ? ? Modules accepted: Orders ? ?

## 2021-06-13 NOTE — Telephone Encounter (Signed)
Please advise 

## 2021-06-13 NOTE — Telephone Encounter (Signed)
Spoke with patient, he stated that he stopped Testosterone around one month ago.     Patient requested for Testosterone levels to be checked when CBC is checked.     CBC order has been place.    Will forward message to Mykal to fax referral to [facility] on 06/17/2021. ?

## 2021-06-13 NOTE — Telephone Encounter (Signed)
Pt called stating that [facility] is requesting that the order be sent in a group of multiple instead of one order so they can draw for different things; instead of keep requesting for a Rx refill. Need it to be sent over asap. Pt aware that Dr. Is out of the office next week.  ? ?Urgent. Please Advise. ?

## 2021-06-17 NOTE — Telephone Encounter (Signed)
Mykal can you assist with this?   Thanks ?

## 2021-06-17 NOTE — Telephone Encounter (Signed)
Harold Park can you assist with this?   Thanks. ?

## 2021-06-17 NOTE — Addendum Note (Signed)
Addended by: Nilda Riggs on: 06/17/2021 09:27 AM ? ? Modules accepted: Orders ? ?

## 2021-06-19 NOTE — Telephone Encounter (Signed)
[facility] Rep called again because she needs PCP signature on paperwork and wanted to know who to make it attention to. I let her know she can make it attention to Dr.[name] but he was out of the office until next week. Rep wanted to know if another provider could sign for him and stated she was faxing paperwork now. ? ? ? ? ? ?Please advise  ?

## 2021-06-19 NOTE — Telephone Encounter (Signed)
Labs have been faxed to [facility] ?

## 2021-06-19 NOTE — Telephone Encounter (Signed)
Pt will have [facility] rep call to confirm what they need ?

## 2021-06-20 NOTE — Telephone Encounter (Signed)
Noted  

## 2021-06-20 NOTE — Telephone Encounter (Signed)
Stephanie from [facility] called to advise [place] that we sent the lab results instead of the requested therapeutic ex order.requested that she re-fax the request for processing ?

## 2021-06-20 NOTE — Telephone Encounter (Signed)
Requisitions for the CBC, testosterone tests from 4/10 and 4/6 were printed and faxed to [facility] at [phone].  Confirmation received and placed on Mykal's desk. ?

## 2021-07-01 ENCOUNTER — Telehealth: Payer: Self-pay | Admitting: Pharmacist

## 2021-07-01 ENCOUNTER — Other Ambulatory Visit: Payer: Self-pay | Admitting: Internal Medicine

## 2021-07-01 NOTE — Telephone Encounter (Signed)
Called pt to see how he was doing on [facility]. States he is doing well. No side effects. Will increase to 0.'5mg'$  this week. Pt scheduled for in person 3 month f/u. Will call him in 1 month. ? ?

## 2021-07-02 ENCOUNTER — Other Ambulatory Visit: Payer: BC Managed Care – PPO

## 2021-07-02 NOTE — Addendum Note (Signed)
Addended by: Octavio Manns E on: 07/02/2021 07:47 AM ? ? Modules accepted: Orders ? ?

## 2021-07-03 ENCOUNTER — Other Ambulatory Visit (INDEPENDENT_AMBULATORY_CARE_PROVIDER_SITE_OTHER): Payer: BC Managed Care – PPO

## 2021-07-03 DIAGNOSIS — D751 Secondary polycythemia: Secondary | ICD-10-CM | POA: Diagnosis not present

## 2021-07-03 DIAGNOSIS — R7989 Other specified abnormal findings of blood chemistry: Secondary | ICD-10-CM

## 2021-07-03 LAB — CBC WITH DIFFERENTIAL/PLATELET
Basophils Absolute: 0.1 10*3/uL (ref 0.0–0.1)
Basophils Relative: 1.2 % (ref 0.0–3.0)
Eosinophils Absolute: 0.6 10*3/uL (ref 0.0–0.7)
Eosinophils Relative: 5.1 % — ABNORMAL HIGH (ref 0.0–5.0)
HCT: 53.7 % — ABNORMAL HIGH (ref 39.0–52.0)
Hemoglobin: 17.7 g/dL — ABNORMAL HIGH (ref 13.0–17.0)
Lymphocytes Relative: 19.8 % (ref 12.0–46.0)
Lymphs Abs: 2.2 10*3/uL (ref 0.7–4.0)
MCHC: 32.9 g/dL (ref 30.0–36.0)
MCV: 89.7 fl (ref 78.0–100.0)
Monocytes Absolute: 0.9 10*3/uL (ref 0.1–1.0)
Monocytes Relative: 7.9 % (ref 3.0–12.0)
Neutro Abs: 7.4 10*3/uL (ref 1.4–7.7)
Neutrophils Relative %: 66 % (ref 43.0–77.0)
Platelets: 229 10*3/uL (ref 150.0–400.0)
RBC: 5.99 Mil/uL — ABNORMAL HIGH (ref 4.22–5.81)
RDW: 15.2 % (ref 11.5–15.5)
WBC: 11.3 10*3/uL — ABNORMAL HIGH (ref 4.0–10.5)

## 2021-07-03 LAB — TESTOSTERONE: Testosterone: 183.22 ng/dL — ABNORMAL LOW (ref 300.00–890.00)

## 2021-07-08 DIAGNOSIS — M722 Plantar fascial fibromatosis: Secondary | ICD-10-CM | POA: Diagnosis not present

## 2021-07-09 ENCOUNTER — Encounter: Payer: Self-pay | Admitting: Gastroenterology

## 2021-07-16 ENCOUNTER — Ambulatory Visit (AMBULATORY_SURGERY_CENTER): Payer: BC Managed Care – PPO | Admitting: Gastroenterology

## 2021-07-16 ENCOUNTER — Encounter: Payer: Self-pay | Admitting: Gastroenterology

## 2021-07-16 VITALS — BP 128/78 | HR 83 | Temp 98.4°F | Resp 21 | Ht 77.0 in | Wt 339.0 lb

## 2021-07-16 DIAGNOSIS — Z1212 Encounter for screening for malignant neoplasm of rectum: Secondary | ICD-10-CM | POA: Diagnosis not present

## 2021-07-16 DIAGNOSIS — D123 Benign neoplasm of transverse colon: Secondary | ICD-10-CM

## 2021-07-16 DIAGNOSIS — Z1211 Encounter for screening for malignant neoplasm of colon: Secondary | ICD-10-CM | POA: Diagnosis not present

## 2021-07-16 DIAGNOSIS — D120 Benign neoplasm of cecum: Secondary | ICD-10-CM

## 2021-07-16 MED ORDER — SODIUM CHLORIDE 0.9 % IV SOLN: 500.0000 mL | Freq: Once | INTRAVENOUS | Status: DC

## 2021-07-16 NOTE — Progress Notes (Signed)
[facility] History and Physical ? ? ?Primary Care Physician:  Eulas Post, MD ? ? ?Reason for Procedure:   Colon cancer screening ? ?Plan:    Screening colonoscopy ? ? ? ? ?HPI: Harold Park is a 56 y.o. male undergoing initial average risk screening colonoscopy.  He has no family history of colon cancer and no chronic GI symptoms. He takes Eliquis for A-fib, last dose May 7th ? ? ?Past Medical History:  ?Diagnosis Date  ? Chronic bronchitis   ? HTN (hypertension)   ? NICM (nonischemic cardiomyopathy) ([place])   ? Obesity (BMI 30-39.9) 12/04/2015  ? OSA (obstructive sleep apnea) 08/19/2015  ? Severe with AHI 64/hr now on CPAP at 13cm H2O  ? PAF (paroxysmal atrial fibrillation) ([place])   ? Systolic CHF, chronic ([place])   ? ? ?Past Surgical History:  ?Procedure Laterality Date  ? APPENDECTOMY    ? BRONCHIAL WASHINGS  04/24/2020  ? Procedure: BRONCHIAL WASHINGS;  Surgeon: Collene Gobble, MD;  Location: [facility] ENDOSCOPY;  Service: Pulmonary;;  ? CARDIAC CATHETERIZATION    ? CARDIOVERSION N/A 06/19/2017  ? Procedure: CARDIOVERSION;  Surgeon: Jolaine Artist, MD;  Location: [place];  Service: Cardiovascular;  Laterality: N/A;  ? FINE NEEDLE ASPIRATION  04/24/2020  ? Procedure: FINE NEEDLE ASPIRATION (FNA) LINEAR;  Surgeon: Collene Gobble, MD;  Location: [place] ENDOSCOPY;  Service: Pulmonary;;  ? LIPOMA RESECTION    ? arms  ? SPLENECTOMY    ? SPLIT NIGHT STUDY  08/12/2015  ? VIDEO BRONCHOSCOPY WITH ENDOBRONCHIAL ULTRASOUND N/A 04/24/2020  ? Procedure: VIDEO BRONCHOSCOPY WITH ENDOBRONCHIAL ULTRASOUND;  Surgeon: Collene Gobble, MD;  Location: [facility] ENDOSCOPY;  Service: Pulmonary;  Laterality: N/A;  ? ? ?Prior to Admission medications   ?Medication Sig Start Date End Date Taking? Authorizing Provider  ?carvedilol (COREG) 25 MG tablet Take 25 mg by mouth daily in the afternoon.   Yes [provider]  ?DULoxetine (CYMBALTA) 20 MG capsule Take 1 capsule (20 mg total) by mouth daily. 08/21/20  Yes Bensimhon,  Shaune Pascal, MD  ?eplerenone (INSPRA) 25 MG tablet Take 1 tablet (25 mg total) by mouth daily. 05/27/21  Yes Bensimhon, Shaune Pascal, MD  ?ibuprofen (ADVIL,MOTRIN) 200 MG tablet Take 800 mg by mouth every 8 (eight) hours as needed for mild pain or moderate pain.   Yes [provider]  ?KLOR-CON M20 20 MEQ tablet TAKE 4 TABLETS BY MOUTH EVERY MORNING AND 2 TABLETS DAILY W/ LUNCH AND 4 TABS EVERY EVENING. 05/27/21  Yes Bensimhon, Shaune Pascal, MD  ?ranolazine (RANEXA) 500 MG 12 hr tablet TAKE 1 TABLET BY MOUTH TWICE A DAY 07/01/21  Yes Bensimhon, Shaune Pascal, MD  ?sacubitril-valsartan (ENTRESTO) 97-103 MG Take 1 tablet by mouth 2 (two) times daily. 04/24/20  Yes Collene Gobble, MD  ?torsemide (DEMADEX) 20 MG tablet TAKE 1 TABLET BY MOUTH EVERY DAY 04/22/21  Yes Bensimhon, Shaune Pascal, MD  ?albuterol (VENTOLIN HFA) 108 (90 Base) MCG/ACT inhaler Inhale 2 puffs into the lungs every 6 (six) hours as needed for wheezing or shortness of breath. 07/05/20   Collene Gobble, MD  ?ELIQUIS 5 MG TABS tablet TAKE 1 TABLET (5 MG TOTAL) BY MOUTH 2 (TWO) TIMES DAILY. PATIENT NEEDS TO SCHEDULE F/U APPT 11/05/20   Bensimhon, Shaune Pascal, MD  ?methocarbamol (ROBAXIN) 500 MG tablet Take 500 mg by mouth 2 (two) times daily as needed for muscle spasms.     [provider]  ?metolazone (ZAROXOLYN) 5 MG tablet Take 1 tablet (5  mg total) by mouth daily as needed (Rapid weight gain). Take only as directed by the heart failure clinic 04/24/20   Collene Gobble, MD  ?NEEDLE, DISP, 22 G 22G X 1" MISC Use to inject testosterone into the muscle once every 14 days. 10/19/20   [name], Alinda Sierras, MD  ?Semaglutide-Weight Management 0.5 MG/0.5ML SOAJ Inject 0.5 mg into the skin once a week for 28 days. ?Patient not taking: Reported on 06/07/2021 06/26/21 07/24/21  Bensimhon, Shaune Pascal, MD  ?Semaglutide-Weight Management 1 MG/0.5ML SOAJ Inject 1 mg into the skin once a week for 28 days. ?Patient not taking: Reported on 06/07/2021 07/25/21 08/22/21  Bensimhon, Shaune Pascal,  MD  ?Semaglutide-Weight Management 1.7 MG/0.75ML SOAJ Inject 1.7 mg into the skin once a week for 28 days. ?Patient not taking: Reported on 06/07/2021 08/23/21 09/20/21  Bensimhon, Shaune Pascal, MD  ?Semaglutide-Weight Management 2.4 MG/0.75ML SOAJ Inject 2.4 mg into the skin once a week. ?Patient not taking: Reported on 06/07/2021 09/21/21   Bensimhon, Shaune Pascal, MD  ?sildenafil (VIAGRA) 100 MG tablet Take 1 tablet (100 mg total) by mouth as needed for erectile dysfunction. 04/24/20   Collene Gobble, MD  ? ? ?Current Outpatient Medications  ?Medication Sig Dispense Refill  ? carvedilol (COREG) 25 MG tablet Take 25 mg by mouth daily in the afternoon.    ? DULoxetine (CYMBALTA) 20 MG capsule Take 1 capsule (20 mg total) by mouth daily. 90 capsule 3  ? eplerenone (INSPRA) 25 MG tablet Take 1 tablet (25 mg total) by mouth daily. 30 tablet 11  ? ibuprofen (ADVIL,MOTRIN) 200 MG tablet Take 800 mg by mouth every 8 (eight) hours as needed for mild pain or moderate pain.    ? KLOR-CON M20 20 MEQ tablet TAKE 4 TABLETS BY MOUTH EVERY MORNING AND 2 TABLETS DAILY W/ LUNCH AND 4 TABS EVERY EVENING. 900 tablet 1  ? ranolazine (RANEXA) 500 MG 12 hr tablet TAKE 1 TABLET BY MOUTH TWICE A DAY 180 tablet 3  ? sacubitril-valsartan (ENTRESTO) 97-103 MG Take 1 tablet by mouth 2 (two) times daily.    ? torsemide (DEMADEX) 20 MG tablet TAKE 1 TABLET BY MOUTH EVERY DAY 90 tablet 3  ? albuterol (VENTOLIN HFA) 108 (90 Base) MCG/ACT inhaler Inhale 2 puffs into the lungs every 6 (six) hours as needed for wheezing or shortness of breath. 8 g 6  ? ELIQUIS 5 MG TABS tablet TAKE 1 TABLET (5 MG TOTAL) BY MOUTH 2 (TWO) TIMES DAILY. PATIENT NEEDS TO SCHEDULE F/U APPT 60 tablet 0  ? methocarbamol (ROBAXIN) 500 MG tablet Take 500 mg by mouth 2 (two) times daily as needed for muscle spasms.     ? metolazone (ZAROXOLYN) 5 MG tablet Take 1 tablet (5 mg total) by mouth daily as needed (Rapid weight gain). Take only as directed by the heart failure clinic    ? NEEDLE,  DISP, 22 G 22G X 1" MISC Use to inject testosterone into the muscle once every 14 days. 6 each 3  ? Semaglutide-Weight Management 0.5 MG/0.5ML SOAJ Inject 0.5 mg into the skin once a week for 28 days. (Patient not taking: Reported on 06/07/2021) 2 mL 0  ? [START ON 07/25/2021] Semaglutide-Weight Management 1 MG/0.5ML SOAJ Inject 1 mg into the skin once a week for 28 days. (Patient not taking: Reported on 06/07/2021) 2 mL 0  ? [START ON 08/23/2021] Semaglutide-Weight Management 1.7 MG/0.75ML SOAJ Inject 1.7 mg into the skin once a week for 28 days. (Patient not taking:  Reported on 06/07/2021) 3 mL 0  ? [START ON 09/21/2021] Semaglutide-Weight Management 2.4 MG/0.75ML SOAJ Inject 2.4 mg into the skin once a week. (Patient not taking: Reported on 06/07/2021) 3 mL 11  ? sildenafil (VIAGRA) 100 MG tablet Take 1 tablet (100 mg total) by mouth as needed for erectile dysfunction.    ? ?Current Facility-Administered Medications  ?Medication Dose Route Frequency Provider Last Rate Last Admin  ? 0.9 %  sodium chloride infusion  500 mL Intravenous Once Daryel November, MD      ? ? ?Allergies as of 07/16/2021 - Review Complete 06/07/2021  ?Allergen Reaction Noted  ? Amiodarone Nausea Only 06/17/2017  ? Spironolactone  05/27/2021  ? ? ?Family History  ?Problem Relation Age of Onset  ? Breast cancer Mother   ? Cancer Father   ?     blood cancer  ? Hypertension Other   ? Heart disease Other   ? Stomach cancer Neg Hx   ? Colon cancer Neg Hx   ? Esophageal cancer Neg Hx   ? Pancreatic cancer Neg Hx   ? Rectal cancer Neg Hx   ? ? ?Social History  ? ?Socioeconomic History  ? Marital status: Married  ?  Spouse name: Not on file  ? Number of children: 3  ? Years of education: Not on file  ? Highest education level: Not on file  ?Occupational History  ? Occupation: realtor  ?Tobacco Use  ? Smoking status: Never  ? Smokeless tobacco: Never  ?Vaping Use  ? Vaping Use: Never used  ?Substance and Sexual Activity  ? Alcohol use: Yes  ?  Comment:  occasional  ? Drug use: Never  ? Sexual activity: Not on file  ?Other Topics Concern  ? Not on file  ?Social History Narrative  ? Not on file  ? ?Social Determinants of Health  ? ?Therapist, nutritional

## 2021-07-16 NOTE — Progress Notes (Signed)
Called to room to assist during endoscopic procedure.  Patient ID and intended procedure confirmed with present staff. Received instructions for my participation in the procedure from the performing physician.  

## 2021-07-16 NOTE — Patient Instructions (Signed)
Handouts provided on polyps and diverticulosis.  ? ?Repeat colonoscopy in 1 year because the bowel preparation was suboptimal.  ? ?Resume Eliquis on Thursday (07/18/21).  ? ?YOU HAD AN ENDOSCOPIC PROCEDURE TODAY AT [facility]:   Refer to the procedure report that was given to you for any specific questions about what was found during the examination.  If the procedure report does not answer your questions, please call your gastroenterologist to clarify.  If you requested that your care partner not be given the details of your procedure findings, then the procedure report has been included in a sealed envelope for you to review at your convenience later. ? ?YOU SHOULD EXPECT: Some feelings of bloating in the abdomen. Passage of more gas than usual.  Walking can help get rid of the air that was put into your GI tract during the procedure and reduce the bloating. If you had a lower endoscopy (such as a colonoscopy or flexible sigmoidoscopy) you may notice spotting of blood in your stool or on the toilet paper. If you underwent a bowel prep for your procedure, you may not have a normal bowel movement for a few days. ? ?Please Note:  You might notice some irritation and congestion in your nose or some drainage.  This is from the oxygen used during your procedure.  There is no need for concern and it should clear up in a day or so. ? ?SYMPTOMS TO REPORT IMMEDIATELY: ? ?Following lower endoscopy (colonoscopy or flexible sigmoidoscopy): ? Excessive amounts of blood in the stool ? Significant tenderness or worsening of abdominal pains ? Swelling of the abdomen that is new, acute ? Fever of 100?F or higher ? ?For urgent or emergent issues, a gastroenterologist can be reached at any hour by calling [phone]. ?Do not use MyChart messaging for urgent concerns.  ? ? ?DIET:  We do recommend a small meal at first, but then you may proceed to your regular diet.  Drink plenty of fluids but you should avoid  alcoholic beverages for 24 hours. ? ?ACTIVITY:  You should plan to take it easy for the rest of today and you should NOT DRIVE or use heavy machinery until tomorrow (because of the sedation medicines used during the test).   ? ?FOLLOW UP: ?Our staff will call the number listed on your records 48-72 hours following your procedure to check on you and address any questions or concerns that you may have regarding the information given to you following your procedure. If we do not reach you, we will leave a message.  We will attempt to reach you two times.  During this call, we will ask if you have developed any symptoms of COVID 19. If you develop any symptoms (ie: fever, flu-like symptoms, shortness of breath, cough etc.) before then, please call [phone].  If you test positive for Covid 19 in the 2 weeks post procedure, please call and report this information to [place].   ? ?If any biopsies were taken you will be contacted by phone or by letter within the next 1-3 weeks.  Please call us at [phone] if you have not heard about the biopsies in 3 weeks.  ? ? ?SIGNATURES/CONFIDENTIALITY: ?You and/or your care partner have signed paperwork which will be entered into your electronic medical record.  These signatures attest to the fact that that the information above on your After Visit Summary has been reviewed and is understood.  Full responsibility of the confidentiality of this  discharge information lies with you and/or your care-partner. ? ?

## 2021-07-16 NOTE — Progress Notes (Signed)
A and O x3. Report to RN. Tolerated MAC anesthesia well. 

## 2021-07-16 NOTE — Op Note (Signed)
[place] ?Patient Name: Harold Park ?Procedure Date: 07/16/2021 12:18 PM ?MRN: [id number] ?Endoscopist:  E. Candis Schatz , MD ?Age: 56 ?Referring MD:  ?Date of Birth: 15-Sep-1965 ?Gender: Male ?Account #: [id number] ?Procedure:                Colonoscopy ?Indications:              Screening for colorectal malignant neoplasm, This  ?                          is the patient's first colonoscopy ?Medicines:                Monitored Anesthesia Care ?Procedure:                Pre-Anesthesia Assessment: ?                          - Prior to the procedure, a History and Physical  ?                          was performed, and patient medications and  ?                          allergies were reviewed. The patient's tolerance of  ?                          previous anesthesia was also reviewed. The risks  ?                          and benefits of the procedure and the sedation  ?                          options and risks were discussed with the patient.  ?                          All questions were answered, and informed consent  ?                          was obtained. Prior Anticoagulants: The patient has  ?                          taken Eliquis (apixaban), last dose was 2 days  ?                          prior to procedure. ASA Grade Assessment: III - A  ?                          patient with severe systemic disease. After  ?                          reviewing the risks and benefits, the patient was  ?                          deemed in satisfactory condition to undergo the  ?  procedure. ?                          After obtaining informed consent, the colonoscope  ?                          was passed under direct vision. Throughout the  ?                          procedure, the patient's blood pressure, pulse, and  ?                          oxygen saturations were monitored continuously. The  ?                          Olympus CF-[id number] (#[id number]) Colonoscope was  ?                           introduced through the anus and advanced to the the  ?                          cecum, identified by appendiceal orifice and  ?                          ileocecal valve. The colonoscopy was performed with  ?                          moderate difficulty due to significant looping.  ?                          Successful completion of the procedure was aided by  ?                          using manual pressure and withdrawing and  ?                          reinserting the scope. The patient tolerated the  ?                          procedure well. The quality of the bowel  ?                          preparation was fair. The ileocecal valve,  ?                          appendiceal orifice, and rectum were photographed.  ?                          The bowel preparation used was SUPREP via split  ?                          dose instruction. ?Scope In: 12:29:00 PM ?Scope Out: 1:08:01 PM ?Scope Withdrawal Time: 0 hours 27 minutes 49 seconds  ?Total Procedure Duration: 0 hours 39 minutes 1 second  ?Findings:  The perianal and digital rectal examinations were  ?                          normal. Pertinent negatives include normal  ?                          sphincter tone and no palpable rectal lesions. ?                          A 4 mm polyp was found in the cecum. The polyp was  ?                          sessile. The polyp was removed with a cold snare.  ?                          Resection and retrieval were complete. Estimated  ?                          blood loss was minimal. ?                          A 14 mm polyp was found in the cecum. The polyp was  ?                          sessile. The polyp was removed with a cold snare.  ?                          Resection and retrieval were complete. Estimated  ?                          blood loss was minimal. ?                          A 4 mm polyp was found in the transverse colon. The  ?                          polyp was sessile. The  polyp was removed with a  ?                          cold snare. Resection and retrieval were complete.  ?                          Estimated blood loss was minimal. ?                          Many small and large-mouthed diverticula were found  ?                          in the sigmoid colon, descending colon, transverse  ?                          colon and cecum. ?  The exam was otherwise normal throughout the  ?                          examined colon. ?                          The retroflexed view of the distal rectum and anal  ?                          verge was normal and showed no anal or rectal  ?                          abnormalities. ?Complications:            No immediate complications. ?Estimated Blood Loss:     Estimated blood loss was minimal. ?Impression:               - Preparation of the colon was fair. ?                          - One 4 mm polyp in the cecum, removed with a cold  ?                          snare. Resected and retrieved. ?                          - One 14 mm polyp in the cecum, removed with a cold  ?                          snare. Resected and retrieved. ?                          - One 4 mm polyp in the transverse colon, removed  ?                          with a cold snare. Resected and retrieved. ?                          - Diverticulosis in the sigmoid colon, in the  ?                          descending colon, in the transverse colon and in  ?                          the cecum. ?                          - The distal rectum and anal verge are normal on  ?                          retroflexion view. ?Recommendation:           - Patient has a contact number available for  ?  emergencies. The signs and symptoms of potential  ?                          delayed complications were discussed with the  ?                          patient. Return to normal activities tomorrow.  ?                          Written discharge  instructions were provided to the  ?                          patient. ?                          - Resume previous diet. ?                          - Continue present medications. ?                          - Await pathology results. ?                          - Repeat colonoscopy in 1 year because the bowel  ?                          preparation was suboptimal. ?                          - Resume Eliquis on Thursday ? E. Candis Schatz, MD ?07/16/2021 1:17:30 PM ?This report has been signed electronically. ?

## 2021-07-18 ENCOUNTER — Telehealth: Payer: Self-pay | Admitting: *Deleted

## 2021-07-18 ENCOUNTER — Telehealth: Payer: Self-pay

## 2021-07-18 NOTE — Telephone Encounter (Signed)
?  Follow up Call- ? ? ?  07/16/2021  ? 11:27 AM  ?Call back number  ?Post procedure Call Back phone  # [phone]  ?Permission to leave phone message Yes  ?  ? ?Patient questions: ? ?Do you have a fever, pain , or abdominal swelling? No. ?Pain Score  0 * ? ?Have you tolerated food without any problems? Yes.   ? ?Have you been able to return to your normal activities? Yes.   ? ?Do you have any questions about your discharge instructions: ?Diet   No. ?Medications  No. ?Follow up visit  No. ? ?Do you have questions or concerns about your Care? No. ? ?Actions: ?* If pain score is 4 or above: ?No action needed, pain <4. ? ? ?

## 2021-07-18 NOTE — Telephone Encounter (Signed)
Left message on answering machine. 

## 2021-07-23 NOTE — Progress Notes (Signed)
Mr. Pryer, ? ?The three polyps that I removed during your recent procedure were completely benign but were proven to be "pre-cancerous" polyps that MAY have grown into cancers if they had not been removed.  Studies shows that at least 20% of women over age 57 and 30% of men over age 55 have pre-cancerous polyps.  Because of your polyps and fair bowel prep quality, I recommend you repeat colonoscopy in 1 year.  ? ?If you develop any new rectal bleeding, abdominal pain or significant bowel habit changes, please contact me before then. ? ?

## 2021-07-29 NOTE — Telephone Encounter (Signed)
Called pt to follow up on how he is doing on [facility] 0.'5mg'$ . LVM for pt to call back.

## 2021-07-31 NOTE — Telephone Encounter (Signed)
Patient doing well on Wegovy. Was supposed to start '1mg'$  on Tuesday, but pharmacy doesn't have. I advised he call a few other pharmacies [facility]. There is a known Event organiser. Patient will let me know if he finds somewhere that has it.  Has 1 week before he would need to restart titration.

## 2021-08-06 ENCOUNTER — Other Ambulatory Visit: Payer: Self-pay | Admitting: Emergency Medicine

## 2021-08-06 ENCOUNTER — Telehealth (HOSPITAL_COMMUNITY): Payer: Self-pay | Admitting: *Deleted

## 2021-08-06 NOTE — Telephone Encounter (Signed)
Pt called stating he was contacted by [facility] for PET scan but pt thought Dr.Bensimhon wa holding off on PET scan for now. Per last office note it appeared that we are proceeding with scheduling PET scan but pt asked that I follow up with Dr.Bensimhon just to be sure.  Routed to [place] for advice

## 2021-08-07 NOTE — Telephone Encounter (Signed)
Pt aware.

## 2021-08-08 DIAGNOSIS — I272 Pulmonary hypertension, unspecified: Secondary | ICD-10-CM | POA: Diagnosis not present

## 2021-08-08 DIAGNOSIS — D8685 Sarcoid myocarditis: Secondary | ICD-10-CM | POA: Diagnosis not present

## 2021-08-08 DIAGNOSIS — I509 Heart failure, unspecified: Secondary | ICD-10-CM | POA: Diagnosis not present

## 2021-08-08 DIAGNOSIS — R9439 Abnormal result of other cardiovascular function study: Secondary | ICD-10-CM | POA: Diagnosis not present

## 2021-08-08 DIAGNOSIS — I110 Hypertensive heart disease with heart failure: Secondary | ICD-10-CM | POA: Diagnosis not present

## 2021-08-09 DIAGNOSIS — M722 Plantar fascial fibromatosis: Secondary | ICD-10-CM | POA: Diagnosis not present

## 2021-08-21 NOTE — Telephone Encounter (Signed)
Patient was able to find [facility] '1mg'$ . Doing well. Some constipation, uses Miralax with relief. Due to increase to 1.'7mg'$  soon. F/U in office already scheduled for 6/19.

## 2021-08-26 ENCOUNTER — Ambulatory Visit (INDEPENDENT_AMBULATORY_CARE_PROVIDER_SITE_OTHER): Payer: BC Managed Care – PPO | Admitting: Pharmacist

## 2021-08-26 ENCOUNTER — Telehealth: Payer: Self-pay | Admitting: Physician Assistant

## 2021-08-26 VITALS — Wt 329.6 lb

## 2021-08-26 DIAGNOSIS — E669 Obesity, unspecified: Secondary | ICD-10-CM

## 2021-08-26 NOTE — Progress Notes (Signed)
Patient ID: Harold Park                 DOB: [birth year]                    MRN: [id number]     HPI: Harold Park is a 56 y.o. male patient referred to pharmacy clinic by Dr. Haroldine Laws to initiate weight loss therapy with GLP1-RA. PMH is significant for obesity complicated by chronic medical conditions including NICM with EF 34-74%, chronic systolic CHF, parox A-Flutter with RVR (01/2010), HTN, s/p splenectomy, s/p appendectomy and possible sarcoidosis. Most recent BMI 39.  Patient presents to CVRR clinic for 3 month follow up. He is just about to start [facility] 1.'7mg'$ . He lost about 20lb prior to starting Wegovy (in Jan weight was 360ln). He is down another 12 pounds. He states his weight loss has stalled lately. Has cut back on fast food, but still sometimes gets french fries and a mountain dew. Will often time grab something a vendor brought in the office such as a snack bar. He has not missed any doses. Some constipation but using mirlax with relief.  Not a lot of exercise other than yard work and walking 1-2 times a week.  Current weight management medications: Wegovy '1mg'$  weekly  Previously tried meds: none  Current meds that may affect weight: torsemide  Baseline weight/BMI: 341lb/40.4  Insurance payor: [employer]  Diet:  -Breakfast: nothing set, if he is hungry will eat left overs, coffee w/ a little bit of creamer -Lunch: cold cut sandwich, trying not to do fast food- sometimes french fries and mountain dew, grilled chicken -Dinner: chicken, pot roast, salads,  -Snacks: potato chips, cookie (every once in awhile) -Drinks: mountain dew, water w/ a little bit of OJ or lemonade mix  Exercise: on his feet showing houses, yard work, no organized exercise, golf once a week, walks 1 mile once or twice a week  Family History:  Family History  Problem Relation Age of Onset   Breast cancer Mother    Cancer Father        blood cancer   Hypertension Other    Heart disease Other     Stomach cancer Neg Hx    Colon cancer Neg Hx    Esophageal cancer Neg Hx    Pancreatic cancer Neg Hx    Rectal cancer Neg Hx      Social History:  Social History   Socioeconomic History   Marital status: Married    Spouse name: Not on file   Number of children: 3   Years of education: Not on file   Highest education level: Not on file  Occupational History   Occupation: realtor  Tobacco Use   Smoking status: Never   Smokeless tobacco: Never  Vaping Use   Vaping Use: Never used  Substance and Sexual Activity   Alcohol use: Yes    Comment: occasional   Drug use: Never   Sexual activity: Not on file  Other Topics Concern   Not on file  Social History Narrative   Not on file   Social Determinants of Health   Financial Resource Strain: Not on file  Food Insecurity: Not on file  Transportation Needs: Not on file  Physical Activity: Not on file  Stress: Not on file  Social Connections: Not on file  Intimate Partner Violence: Not on file      Labs: Lab Results  Component Value Date   HGBA1C 5.8 (H) 02/02/2019  Wt Readings from Last 1 Encounters:  07/16/21 (!) 339 lb (153.8 kg)    BP Readings from Last 1 Encounters:  07/16/21 128/78   Pulse Readings from Last 1 Encounters:  07/16/21 83       Component Value Date/Time   CHOL 158 02/02/2019 1045   TRIG 275 (H) 02/02/2019 1045   HDL 28 (L) 02/02/2019 1045   CHOLHDL 5.6 02/02/2019 1045   VLDL 55 (H) 02/02/2019 1045   Harmon 75 02/02/2019 1045    Past Medical History:  Diagnosis Date   Chronic bronchitis    HTN (hypertension)    NICM (nonischemic cardiomyopathy) (HCC)    Obesity (BMI 30-39.9) 12/04/2015   OSA (obstructive sleep apnea) 08/19/2015   Severe with AHI 64/hr now on CPAP at 13cm H2O   PAF (paroxysmal atrial fibrillation) (HCC)    Systolic CHF, chronic (HCC)     Current Outpatient Medications on File Prior to Visit  Medication Sig Dispense Refill   albuterol (VENTOLIN HFA) 108 (90  Base) MCG/ACT inhaler Inhale 2 puffs into the lungs every 6 (six) hours as needed for wheezing or shortness of breath. 8 g 6   carvedilol (COREG) 25 MG tablet Take 25 mg by mouth daily in the afternoon.     DULoxetine (CYMBALTA) 20 MG capsule Take 1 capsule (20 mg total) by mouth daily. 90 capsule 3   ELIQUIS 5 MG TABS tablet TAKE 1 TABLET (5 MG TOTAL) BY MOUTH 2 (TWO) TIMES DAILY. PATIENT NEEDS TO SCHEDULE F/U APPT 60 tablet 0   eplerenone (INSPRA) 25 MG tablet Take 1 tablet (25 mg total) by mouth daily. 30 tablet 11   ibuprofen (ADVIL,MOTRIN) 200 MG tablet Take 800 mg by mouth every 8 (eight) hours as needed for mild pain or moderate pain.     KLOR-CON M20 20 MEQ tablet TAKE 4 TABLETS BY MOUTH EVERY MORNING AND 2 TABLETS DAILY W/ LUNCH AND 4 TABS EVERY EVENING. 900 tablet 1   methocarbamol (ROBAXIN) 500 MG tablet Take 500 mg by mouth 2 (two) times daily as needed for muscle spasms.      metolazone (ZAROXOLYN) 5 MG tablet Take 1 tablet (5 mg total) by mouth daily as needed (Rapid weight gain). Take only as directed by the heart failure clinic     NEEDLE, DISP, 22 G 22G X 1" MISC Use to inject testosterone into the muscle once every 14 days. 6 each 3   ranolazine (RANEXA) 500 MG 12 hr tablet TAKE 1 TABLET BY MOUTH TWICE A DAY 180 tablet 3   sacubitril-valsartan (ENTRESTO) 97-103 MG Take 1 tablet by mouth 2 (two) times daily.     Semaglutide-Weight Management 1.7 MG/0.75ML SOAJ Inject 1.7 mg into the skin once a week for 28 days. (Patient not taking: Reported on 06/07/2021) 3 mL 0   [START ON 09/21/2021] Semaglutide-Weight Management 2.4 MG/0.75ML SOAJ Inject 2.4 mg into the skin once a week. (Patient not taking: Reported on 06/07/2021) 3 mL 11   sildenafil (VIAGRA) 100 MG tablet Take 1 tablet (100 mg total) by mouth as needed for erectile dysfunction.     torsemide (DEMADEX) 20 MG tablet TAKE 1 TABLET BY MOUTH EVERY DAY 90 tablet 3   No current facility-administered medications on file prior to visit.     Allergies  Allergen Reactions   Amiodarone Nausea Only   Spironolactone      Assessment/Plan:  1. Weight loss - Patient has lost 3.5% of body weight since starting Wegovy. 8% of body weight  since Jan. Patient has decreased the amount he is eating, but still eating a lot of processed and sugar filled foods. We discussed planning ahead for meals, especially when he is working. Encouraged him to stop drinking soda. Also encouraged that he go for a walk after dinner each night. Increase to [facility] 1.'7mg'$  weekly. Follow up in 1 month via telephone and 3 months in clinic. Patient is having surgery in Oct. We discussed the need to talk with surgeon and anesthesiologist about being on Wegovy and when he might need to stop it pre-op since its affect on stomach emptying can increase the risk of aspiration. We also talked about the data for the length of Wegovy therapy, what could happen if/when he stops and the importance of lifestyle changes.  Thank you,   Ramond Dial, Pharm.D, BCPS, CPP [place]  [address], [place], [place] [address]  Phone: [phone]; Fax: [phone]

## 2021-08-26 NOTE — Telephone Encounter (Signed)
The patient called after hours to get refills on Duloxetine. Recommended to PCP for refills.

## 2021-08-26 NOTE — Patient Instructions (Addendum)
Try to prep lunches in advance or keep nuts/ hummus/ carrots/ vegetables, apple peanut butter on hand with ypu Try hard to cut out soda  Try walking after dinner  Call me with any questions [phone]

## 2021-08-27 ENCOUNTER — Other Ambulatory Visit (HOSPITAL_COMMUNITY): Payer: Self-pay | Admitting: *Deleted

## 2021-08-27 ENCOUNTER — Telehealth: Payer: Self-pay | Admitting: Family Medicine

## 2021-08-27 DIAGNOSIS — D751 Secondary polycythemia: Secondary | ICD-10-CM

## 2021-08-27 MED ORDER — SILDENAFIL CITRATE 100 MG PO TABS: 100.0000 mg | ORAL_TABLET | ORAL | 0 refills | Status: DC | PRN

## 2021-08-27 NOTE — Addendum Note (Signed)
Addended by: Nilda Riggs on: 08/27/2021 01:08 PM   Modules accepted: Orders

## 2021-08-27 NOTE — Telephone Encounter (Signed)
Pt checking on an order to [facility] states this was discussed for him to have regular blood work. Also states he was supposed to he referred to hematologist

## 2021-08-27 NOTE — Telephone Encounter (Signed)
Order have been faxed from [facility] to our office for blood draw

## 2021-08-27 NOTE — Telephone Encounter (Signed)
Referral placed and pt aware. Awaiting fax from [facility]

## 2021-08-28 ENCOUNTER — Telehealth: Payer: Self-pay | Admitting: Hematology

## 2021-08-28 NOTE — Telephone Encounter (Signed)
Scheduled appt per 6/20 referral. Pt is aware of appt date and time. Pt is aware to arrive 15 mins prior to appt time and to bring and updated insurance card. Pt is aware of appt location.   

## 2021-08-30 ENCOUNTER — Encounter (HOSPITAL_COMMUNITY): Payer: Self-pay | Admitting: *Deleted

## 2021-08-30 ENCOUNTER — Other Ambulatory Visit (HOSPITAL_COMMUNITY): Payer: Self-pay | Admitting: Cardiology

## 2021-08-30 MED ORDER — DULOXETINE HCL 20 MG PO CPEP
20.0000 mg | ORAL_CAPSULE | Freq: Every day | ORAL | 0 refills | Status: DC
Start: 2021-08-30 — End: 2021-09-23

## 2021-09-12 ENCOUNTER — Other Ambulatory Visit (HOSPITAL_COMMUNITY): Payer: Self-pay | Admitting: *Deleted

## 2021-09-12 MED ORDER — ENTRESTO 97-103 MG PO TABS: 1.0000 | ORAL_TABLET | Freq: Two times a day (BID) | ORAL | 6 refills | Status: DC

## 2021-09-12 MED ORDER — APIXABAN 5 MG PO TABS: 5.0000 mg | ORAL_TABLET | Freq: Two times a day (BID) | ORAL | 6 refills | Status: DC

## 2021-09-21 ENCOUNTER — Other Ambulatory Visit (HOSPITAL_COMMUNITY): Payer: Self-pay | Admitting: Internal Medicine

## 2021-09-25 ENCOUNTER — Telehealth: Payer: Self-pay | Admitting: Pharmacist

## 2021-09-25 NOTE — Telephone Encounter (Signed)
Spoke with patient. Started 2.'4mg'$  yesterday. Constipation worse and energy down for the last few weeks worse towards the end of the week. HBG is high. Seeing hematologist on the 25th.  Has been avoiding soda.  Will continue [facility] 2.'4mg'$  follow up in 4 weeks.

## 2021-10-01 ENCOUNTER — Other Ambulatory Visit: Payer: Self-pay

## 2021-10-01 ENCOUNTER — Inpatient Hospital Stay: Payer: BC Managed Care – PPO

## 2021-10-01 ENCOUNTER — Inpatient Hospital Stay: Payer: BC Managed Care – PPO | Attending: Hematology | Admitting: Hematology

## 2021-10-01 VITALS — BP 112/78 | HR 99 | Temp 97.7°F | Resp 20 | Wt 321.5 lb

## 2021-10-01 DIAGNOSIS — Z803 Family history of malignant neoplasm of breast: Secondary | ICD-10-CM | POA: Diagnosis not present

## 2021-10-01 DIAGNOSIS — D751 Secondary polycythemia: Secondary | ICD-10-CM

## 2021-10-01 DIAGNOSIS — G4733 Obstructive sleep apnea (adult) (pediatric): Secondary | ICD-10-CM | POA: Diagnosis not present

## 2021-10-01 DIAGNOSIS — I110 Hypertensive heart disease with heart failure: Secondary | ICD-10-CM | POA: Insufficient documentation

## 2021-10-01 DIAGNOSIS — I5020 Unspecified systolic (congestive) heart failure: Secondary | ICD-10-CM | POA: Insufficient documentation

## 2021-10-01 DIAGNOSIS — I480 Paroxysmal atrial fibrillation: Secondary | ICD-10-CM | POA: Insufficient documentation

## 2021-10-01 DIAGNOSIS — Z7901 Long term (current) use of anticoagulants: Secondary | ICD-10-CM | POA: Diagnosis not present

## 2021-10-01 LAB — CBC WITH DIFFERENTIAL (CANCER CENTER ONLY)
Abs Immature Granulocytes: 0.15 10*3/uL — ABNORMAL HIGH (ref 0.00–0.07)
Basophils Absolute: 0.2 10*3/uL — ABNORMAL HIGH (ref 0.0–0.1)
Basophils Relative: 1 %
Eosinophils Absolute: 0.6 10*3/uL — ABNORMAL HIGH (ref 0.0–0.5)
Eosinophils Relative: 4 %
HCT: 51.1 % (ref 39.0–52.0)
Hemoglobin: 17.5 g/dL — ABNORMAL HIGH (ref 13.0–17.0)
Immature Granulocytes: 1 %
Lymphocytes Relative: 17 %
Lymphs Abs: 2.3 10*3/uL (ref 0.7–4.0)
MCH: 30.8 pg (ref 26.0–34.0)
MCHC: 34.2 g/dL (ref 30.0–36.0)
MCV: 89.8 fL (ref 80.0–100.0)
Monocytes Absolute: 1.5 10*3/uL — ABNORMAL HIGH (ref 0.1–1.0)
Monocytes Relative: 11 %
Neutro Abs: 8.9 10*3/uL — ABNORMAL HIGH (ref 1.7–7.7)
Neutrophils Relative %: 66 %
Platelet Count: 260 10*3/uL (ref 150–400)
RBC: 5.69 MIL/uL (ref 4.22–5.81)
RDW: 13.6 % (ref 11.5–15.5)
WBC Count: 13.6 10*3/uL — ABNORMAL HIGH (ref 4.0–10.5)
nRBC: 0 % (ref 0.0–0.2)

## 2021-10-01 LAB — CMP (CANCER CENTER ONLY)
ALT: 15 U/L (ref 0–44)
AST: 14 U/L — ABNORMAL LOW (ref 15–41)
Albumin: 4.1 g/dL (ref 3.5–5.0)
Alkaline Phosphatase: 76 U/L (ref 38–126)
Anion gap: 5 (ref 5–15)
BUN: 23 mg/dL — ABNORMAL HIGH (ref 6–20)
CO2: 26 mmol/L (ref 22–32)
Calcium: 9.3 mg/dL (ref 8.9–10.3)
Chloride: 107 mmol/L (ref 98–111)
Creatinine: 1.07 mg/dL (ref 0.61–1.24)
GFR, Estimated: >60 mL/min (ref >60)
Glucose, Bld: 69 mg/dL — ABNORMAL LOW (ref 70–99)
Potassium: 4.5 mmol/L (ref 3.5–5.1)
Sodium: 138 mmol/L (ref 135–145)
Total Bilirubin: 0.7 mg/dL (ref 0.3–1.2)
Total Protein: 6.8 g/dL (ref 6.5–8.1)

## 2021-10-01 LAB — TSH: TSH: 2.021 u[IU]/mL (ref 0.350–4.500)

## 2021-10-01 NOTE — Progress Notes (Addendum)
[facility]   HEMATOLOGY/ONCOLOGY CONSULTATION NOTE  Date of Service: 10/01/2021  Patient Care Team: Eulas Post, MD as PCP - General (Family Medicine) Sueanne Margarita, MD as PCP - Sleep Medicine (Sleep Medicine) Sueanne Margarita, MD as PCP - Cardiology (Cardiology)  CHIEF COMPLAINTS/PURPOSE OF CONSULTATION:  Polycythemia  HISTORY OF PRESENTING ILLNESS:   Harold Park is a wonderful 56 y.o. male who has been referred to [place] by Dr .Elease Hashimoto, Alinda Sierras, MD for evaluation and management of polycythemia.  Patient has a history of severe obstructive sleep apnea on CPAP, systolic CHF, obesity, nonischemic cardiomyopathy, paroxysmal atrial fibrillation who has had chronic polycythemia for several years.  He had low testosterone and was started on testosterone that led to additional bump in his hematocrit in the early part of this year with hemoglobin of 19.6 and hematocrit of just shy of 60%.  His testosterone was stopped.  Patient notes that he felt more energy while on testosterone.  Patient notes he is keen to get back on testosterone if possible and would like to work-up his polycythemia to see if this might be possible. Patient notes no history of blood clots.  No history of strokes or heart attacks. His last available labs from 07/03/2021 showed a hemoglobin of 17.7 with hematocrit of 53.7, WBC count of 11.3 with platelets of 229k  He currently notes no other acute new symptoms.  He notes that he has been trying to lose weight and has been on medications for weight loss with some benefits.  He also notes that he is on diuretics and potentially could be somewhat hemoconcentrated.  No fevers no chills no night sweats.  No new bone pains.  MEDICAL HISTORY:  Past Medical History:  Diagnosis Date   Chronic bronchitis    HTN (hypertension)    NICM (nonischemic cardiomyopathy) (HCC)    Obesity (BMI 30-39.9) 12/04/2015   OSA (obstructive sleep apnea) 08/19/2015   Severe with AHI 64/hr now on  CPAP at 13cm H2O   PAF (paroxysmal atrial fibrillation) (HCC)    Systolic CHF, chronic ([place])     SURGICAL HISTORY: Past Surgical History:  Procedure Laterality Date   APPENDECTOMY     BRONCHIAL WASHINGS  04/24/2020   Procedure: BRONCHIAL WASHINGS;  Surgeon: Collene Gobble, MD;  Location: [facility];  Service: Pulmonary;;   CARDIAC CATHETERIZATION     CARDIOVERSION N/A 06/19/2017   Procedure: CARDIOVERSION;  Surgeon: Jolaine Artist, MD;  Location: [facility];  Service: Cardiovascular;  Laterality: N/A;   FINE NEEDLE ASPIRATION  04/24/2020   Procedure: FINE NEEDLE ASPIRATION (FNA) LINEAR;  Surgeon: Collene Gobble, MD;  Location: [facility];  Service: Pulmonary;;   LIPOMA RESECTION     arms   SPLENECTOMY     SPLIT NIGHT STUDY  08/12/2015   VIDEO BRONCHOSCOPY WITH ENDOBRONCHIAL ULTRASOUND N/A 04/24/2020   Procedure: VIDEO BRONCHOSCOPY WITH ENDOBRONCHIAL ULTRASOUND;  Surgeon: Collene Gobble, MD;  Location: [facility];  Service: Pulmonary;  Laterality: N/A;    SOCIAL HISTORY: Social History   Socioeconomic History   Marital status: Married    Spouse name: Not on file   Number of children: 3   Years of education: Not on file   Highest education level: Not on file  Occupational History   Occupation: realtor  Tobacco Use   Smoking status: Never   Smokeless tobacco: Never  Vaping Use   Vaping Use: Never used  Substance and Sexual Activity   Alcohol use: Yes    Comment:  occasional   Drug use: Never   Sexual activity: Not on file  Other Topics Concern   Not on file  Social History Narrative   Not on file   Social Determinants of Health   Financial Resource Strain: Not on file  Food Insecurity: Not on file  Transportation Needs: Not on file  Physical Activity: Not on file  Stress: Not on file  Social Connections: Not on file  Intimate Partner Violence: Not on file    FAMILY HISTORY: Family History  Problem Relation Age of Onset   Breast cancer Mother     Cancer Father        blood cancer   Hypertension Other    Heart disease Other    Stomach cancer Neg Hx    Colon cancer Neg Hx    Esophageal cancer Neg Hx    Pancreatic cancer Neg Hx    Rectal cancer Neg Hx     ALLERGIES:  is allergic to amiodarone and spironolactone.  MEDICATIONS:  Current Outpatient Medications  Medication Sig Dispense Refill   albuterol (VENTOLIN HFA) 108 (90 Base) MCG/ACT inhaler Inhale 2 puffs into the lungs every 6 (six) hours as needed for wheezing or shortness of breath. 8 g 6   apixaban (ELIQUIS) 5 MG TABS tablet Take 1 tablet (5 mg total) by mouth 2 (two) times daily. 60 tablet 6   carvedilol (COREG) 25 MG tablet Take 25 mg by mouth daily in the afternoon.     DULoxetine (CYMBALTA) 20 MG capsule Take 1 capsule (20 mg total) by mouth daily. 30 capsule 0   eplerenone (INSPRA) 25 MG tablet Take 1 tablet (25 mg total) by mouth daily. 30 tablet 11   ibuprofen (ADVIL,MOTRIN) 200 MG tablet Take 800 mg by mouth every 8 (eight) hours as needed for mild pain or moderate pain.     KLOR-CON M20 20 MEQ tablet TAKE 4 TABLETS BY MOUTH EVERY MORNING AND 2 TABLETS DAILY W/ LUNCH AND 4 TABS EVERY EVENING. 900 tablet 1   methocarbamol (ROBAXIN) 500 MG tablet Take 500 mg by mouth 2 (two) times daily as needed for muscle spasms.      metolazone (ZAROXOLYN) 5 MG tablet Take 1 tablet (5 mg total) by mouth daily as needed (Rapid weight gain). Take only as directed by the heart failure clinic     NEEDLE, DISP, 22 G 22G X 1" MISC Use to inject testosterone into the muscle once every 14 days. 6 each 3   ranolazine (RANEXA) 500 MG 12 hr tablet TAKE 1 TABLET BY MOUTH TWICE A DAY 180 tablet 3   sacubitril-valsartan (ENTRESTO) 97-103 MG Take 1 tablet by mouth 2 (two) times daily. 60 tablet 6   Semaglutide-Weight Management 2.4 MG/0.75ML SOAJ Inject 2.4 mg into the skin once a week. (Patient not taking: Reported on 06/07/2021) 3 mL 11   sildenafil (VIAGRA) 100 MG tablet Take 1 tablet (100 mg  total) by mouth as needed for erectile dysfunction. 10 tablet 0   torsemide (DEMADEX) 20 MG tablet TAKE 1 TABLET BY MOUTH EVERY DAY 90 tablet 3   No current facility-administered medications for this visit.    REVIEW OF SYSTEMS:    10 Point review of Systems was done is negative except as noted above.  PHYSICAL EXAMINATION: ECOG PERFORMANCE STATUS: 1 - Symptomatic but completely ambulatory  . Vitals:   10/01/21 1103  BP: 112/78  Pulse: 99  Resp: 20  Temp: 97.7 F (36.5 C)  SpO2: 100%  Filed Weights   10/01/21 1103  Weight: (!) 321 lb 8 oz (145.8 kg)   .Body mass index is 38.12 kg/m.  GENERAL:alert, in no acute distress and comfortable SKIN: no acute rashes, no significant lesions EYES: conjunctiva are pink and non-injected, sclera anicteric OROPHARYNX: MMM, no exudates, no oropharyngeal erythema or ulceration NECK: supple, no JVD LYMPH:  no palpable lymphadenopathy in the cervical, axillary or inguinal regions LUNGS: clear to auscultation b/l with normal respiratory effort HEART: regular rate & rhythm ABDOMEN:  normoactive bowel sounds , non tender, not distended. Extremity: no pedal edema PSYCH: alert & oriented x 3 with fluent speech NEURO: no focal motor/sensory deficits  LABORATORY DATA:  I have reviewed the data as listed  .    Latest Ref Rng & Units 10/01/2021   12:07 PM 07/03/2021    9:57 AM 06/12/2021    9:09 AM  CBC  WBC 4.0 - 10.5 K/uL 13.6  11.3  10.8   Hemoglobin 13.0 - 17.0 g/dL 17.5  17.7  19.6 Repeated and verified X2.   Hematocrit 39.0 - 52.0 % 51.1  53.7  59.0 Repeated and verified X2.   Platelets 150 - 400 K/uL 260  229.0  212.0     .    Latest Ref Rng & Units 10/01/2021   12:07 PM 09/20/2020   10:26 AM 05/03/2020   10:48 AM  CMP  Glucose 70 - 99 mg/dL 69  212  122   BUN 6 - 20 mg/dL '23  19  21   '$ Creatinine 0.61 - 1.24 mg/dL 1.07  1.23  1.20   Sodium 135 - 145 mmol/L 138  135  138   Potassium 3.5 - 5.1 mmol/L 4.5  4.6  5.0   Chloride  98 - 111 mmol/L 107  103  104   CO2 22 - 32 mmol/L '26  24  24   '$ Calcium 8.9 - 10.3 mg/dL 9.3  9.0  9.8   Total Protein 6.5 - 8.1 g/dL 6.8  6.7  7.6   Total Bilirubin 0.3 - 1.2 mg/dL 0.7  0.5  0.8   Alkaline Phos 38 - 126 U/L 76  61  62   AST 15 - 41 U/L '14  15  21   '$ ALT 0 - 44 U/L '15  17  23      '$ RADIOGRAPHIC STUDIES: I have personally reviewed the radiological images as listed and agreed with the findings in the report. No results found.  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 Patient has had chronic polycythemia for a long time with multiple secondary risk factors including severe sleep apnea, systolic CHF, hemoconcentration from diuretics, use of testosterone previously earlier this year. Plan  Discussed the meaning and potential etiologies of primary and secondary polycythemia with the patient. He was recommended to follow-up with his pulmonologist for possible consideration of repeat sleep study and possible new CPAP machine since its been more than a decade since he has had a sleep study and is still feeling sleepy during the day suggesting possible suboptimal treatment of his sleep apnea. We will get JAK2 mutation testing to rule out polycythemia vera Patient is already on anticoagulation for his atrial fibrillation which is probably helping reduce the risk of VTE Patient was recommended to hold off on testosterone replacement at this time. Maintain good p.o. fluid intake to maintain hydration status. -The goals for treating the patient's polycythemia will  depend on whether this is polycythemia vera where we would have to do therapeutic phlebotomies to maintain his hematocrit less than or equal to 45 or if this is secondary polycythemia as he is quite likely with the goal would be to try to keep his hematocrit less than 55 by treating his underlying conditions.  Orders Placed This  Encounter  Procedures   CBC with Differential ([facility] Only)    Standing Status:   Future    Number of Occurrences:   1    Standing Expiration Date:   10/02/2022   CMP ([facility] only)    Standing Status:   Future    Number of Occurrences:   1    Standing Expiration Date:   10/02/2022   Erythropoietin    Standing Status:   Future    Number of Occurrences:   1    Standing Expiration Date:   10/01/2022   JAK2 (including V617F and Exon 12), MPL, and CALR-Next Generation Sequencing    Standing Status:   Future    Number of Occurrences:   1    Standing Expiration Date:   10/02/2022   TSH    Standing Status:   Future    Number of Occurrences:   1    Standing Expiration Date:   10/01/2022    Follow-up Patient's labs from cover this Labs today phone visit with Dr. Irene Limbo in 2 weeks   All of the patients questions were answered with apparent satisfaction. The patient knows to call the clinic with any problems, questions or concerns.  I spent 35 minutes counseling the patient face to face. The total time spent in the appointment was 47 minutes and more than 50% was on counseling and direct patient cares.    Sullivan Lone MD [place] AAHIVMS [facility] Hematology/Oncology Physician [facility]  (Office):       [phone] (Work cell):  [phone] (Fax):           [phone]  10/01/2021 11:19 AM

## 2021-10-03 LAB — ERYTHROPOIETIN: Erythropoietin: 3.4 m[IU]/mL (ref 2.6–18.5)

## 2021-10-04 ENCOUNTER — Telehealth: Payer: Self-pay | Admitting: Hematology

## 2021-10-04 NOTE — Telephone Encounter (Signed)
Left message with follow-up appointment per 7/25 los.

## 2021-10-08 LAB — JAK2 (INCLUDING V617F AND EXON 12), MPL,& CALR-NEXT GEN SEQ

## 2021-10-14 ENCOUNTER — Inpatient Hospital Stay: Payer: BC Managed Care – PPO | Attending: Hematology | Admitting: Hematology

## 2021-10-14 DIAGNOSIS — I509 Heart failure, unspecified: Secondary | ICD-10-CM | POA: Insufficient documentation

## 2021-10-14 DIAGNOSIS — E669 Obesity, unspecified: Secondary | ICD-10-CM | POA: Diagnosis not present

## 2021-10-14 DIAGNOSIS — Z79899 Other long term (current) drug therapy: Secondary | ICD-10-CM | POA: Diagnosis not present

## 2021-10-14 DIAGNOSIS — I10 Essential (primary) hypertension: Secondary | ICD-10-CM | POA: Diagnosis not present

## 2021-10-14 DIAGNOSIS — Z803 Family history of malignant neoplasm of breast: Secondary | ICD-10-CM | POA: Insufficient documentation

## 2021-10-14 DIAGNOSIS — Z800 Family history of malignant neoplasm of digestive organs: Secondary | ICD-10-CM | POA: Diagnosis not present

## 2021-10-14 DIAGNOSIS — I429 Cardiomyopathy, unspecified: Secondary | ICD-10-CM | POA: Diagnosis not present

## 2021-10-14 DIAGNOSIS — G4730 Sleep apnea, unspecified: Secondary | ICD-10-CM | POA: Diagnosis not present

## 2021-10-14 DIAGNOSIS — I480 Paroxysmal atrial fibrillation: Secondary | ICD-10-CM | POA: Diagnosis not present

## 2021-10-14 DIAGNOSIS — D751 Secondary polycythemia: Secondary | ICD-10-CM | POA: Insufficient documentation

## 2021-10-16 ENCOUNTER — Other Ambulatory Visit (HOSPITAL_COMMUNITY): Payer: Self-pay | Admitting: Internal Medicine

## 2021-10-16 ENCOUNTER — Telehealth: Payer: Self-pay | Admitting: Pharmacist

## 2021-10-16 NOTE — Telephone Encounter (Signed)
Called pt to see how he was doing on wegovy 2.'4mg'$ . Says constipation is better, taking some miralax. Dr. Irene Limbo thinks his fatigue is from his outdated CPAP machine and he is going to get a new one.  Has to have hernia surgery in the fall. Advised to make sure they know he is on [facility] and check how long he needs to hold. Advised depending on how long he has to hold, he may need to restart the titration.

## 2021-10-18 ENCOUNTER — Other Ambulatory Visit: Payer: Self-pay | Admitting: Emergency Medicine

## 2021-10-18 ENCOUNTER — Other Ambulatory Visit (HOSPITAL_COMMUNITY): Payer: Self-pay | Admitting: Internal Medicine

## 2021-10-20 NOTE — Progress Notes (Signed)
Marland Kitchen   HEMATOLOGY/ONCOLOGY PHONE VISIT NOTE  Date of Service: 10/20/2021  Patient Care Team: Eulas Post, MD as PCP - General (Family Medicine) Sueanne Margarita, MD as PCP - Sleep Medicine (Sleep Medicine) Sueanne Margarita, MD as PCP - Cardiology (Cardiology)  CHIEF COMPLAINTS/PURPOSE OF CONSULTATION:  Discussion of lab results for evaluation of polycythemia  HISTORY OF PRESENTING ILLNESS:   Harold Park is a wonderful 56 y.o. male who has been referred to [place] by Dr .Elease Hashimoto, Alinda Sierras, MD for evaluation and management of polycythemia.  Patient has a history of severe obstructive sleep apnea on CPAP, systolic CHF, obesity, nonischemic cardiomyopathy, paroxysmal atrial fibrillation who has had chronic polycythemia for several years.  He had low testosterone and was started on testosterone that led to additional bump in his hematocrit in the early part of this year with hemoglobin of 19.6 and hematocrit of just shy of 60%.  His testosterone was stopped.  Patient notes that he felt more energy while on testosterone.  Patient notes he is keen to get back on testosterone if possible and would like to work-up his polycythemia to see if this might be possible. Patient notes no history of blood clots.  No history of strokes or heart attacks. His last available labs from 07/03/2021 showed a hemoglobin of 17.7 with hematocrit of 53.7, WBC count of 11.3 with platelets of 229k  He currently notes no other acute new symptoms.  He notes that he has been trying to lose weight and has been on medications for weight loss with some benefits.  He also notes that he is on diuretics and potentially could be somewhat hemoconcentrated.  No fevers no chills no night sweats.  No new bone pains.  Interval history  .Marland KitchenI connected with Harold Park on 10/14/2021 at  3:30 PM EDT by telephone visit and verified that I am speaking with the correct person using two identifiers.   I discussed the limitations,  risks, security and privacy concerns of performing an evaluation and management service by telemedicine and the availability of in-person appointments. I also discussed with the patient that there may be a patient responsible charge related to this service. The patient expressed understanding and agreed to proceed.   Other persons participating in the visit and their role in the encounter: None  Patient's location: Home Provider's location: [place]  Chief Complaint: Discussion of lab results for evaluation of polycythemia vera.  Patient notes no acute new symptoms since his last clinic visit.. I discussed the polycythemia work-up in detail with the patient.  Molecular study shows no clonal evidence of polycythemia ruling out polycythemia vera.   MEDICAL HISTORY:  Past Medical History:  Diagnosis Date   Chronic bronchitis    HTN (hypertension)    NICM (nonischemic cardiomyopathy) (HCC)    Obesity (BMI 30-39.9) 12/04/2015   OSA (obstructive sleep apnea) 08/19/2015   Severe with AHI 64/hr now on CPAP at 13cm H2O   PAF (paroxysmal atrial fibrillation) (HCC)    Systolic CHF, chronic ([place])     SURGICAL HISTORY: Past Surgical History:  Procedure Laterality Date   APPENDECTOMY     BRONCHIAL WASHINGS  04/24/2020   Procedure: BRONCHIAL WASHINGS;  Surgeon: Collene Gobble, MD;  Location: [place];  Service: Pulmonary;;   CARDIAC CATHETERIZATION     CARDIOVERSION N/A 06/19/2017   Procedure: CARDIOVERSION;  Surgeon: Jolaine Artist, MD;  Location: [place];  Service: Cardiovascular;  Laterality: N/A;   FINE NEEDLE ASPIRATION  04/24/2020   Procedure: FINE NEEDLE ASPIRATION (FNA) LINEAR;  Surgeon: Collene Gobble, MD;  Location: [facility] ENDOSCOPY;  Service: Pulmonary;;   LIPOMA RESECTION     arms   SPLENECTOMY     SPLIT NIGHT STUDY  08/12/2015   VIDEO BRONCHOSCOPY WITH ENDOBRONCHIAL ULTRASOUND N/A 04/24/2020   Procedure: VIDEO BRONCHOSCOPY WITH ENDOBRONCHIAL ULTRASOUND;   Surgeon: Collene Gobble, MD;  Location: [facility] ENDOSCOPY;  Service: Pulmonary;  Laterality: N/A;    SOCIAL HISTORY: Social History   Socioeconomic History   Marital status: Married    Spouse name: Not on file   Number of children: 3   Years of education: Not on file   Highest education level: Not on file  Occupational History   Occupation: realtor  Tobacco Use   Smoking status: Never   Smokeless tobacco: Never  Vaping Use   Vaping Use: Never used  Substance and Sexual Activity   Alcohol use: Yes    Comment: occasional   Drug use: Never   Sexual activity: Not on file  Other Topics Concern   Not on file  Social History Narrative   Not on file   Social Determinants of Health   Financial Resource Strain: Not on file  Food Insecurity: Not on file  Transportation Needs: Not on file  Physical Activity: Not on file  Stress: Not on file  Social Connections: Not on file  Intimate Partner Violence: Not on file    FAMILY HISTORY: Family History  Problem Relation Age of Onset   Breast cancer Mother    Cancer Father        blood cancer   Hypertension Other    Heart disease Other    Stomach cancer Neg Hx    Colon cancer Neg Hx    Esophageal cancer Neg Hx    Pancreatic cancer Neg Hx    Rectal cancer Neg Hx     ALLERGIES:  is allergic to amiodarone and spironolactone.  MEDICATIONS:  Current Outpatient Medications  Medication Sig Dispense Refill   albuterol (VENTOLIN HFA) 108 (90 Base) MCG/ACT inhaler TAKE 2 PUFFS BY MOUTH EVERY 6 HOURS AS NEEDED FOR WHEEZE OR SHORTNESS OF BREATH 8.5 each 2   apixaban (ELIQUIS) 5 MG TABS tablet Take 1 tablet (5 mg total) by mouth 2 (two) times daily. 60 tablet 6   carvedilol (COREG) 25 MG tablet Take 25 mg by mouth daily in the afternoon.     DULoxetine (CYMBALTA) 20 MG capsule TAKE 1 CAPSULE BY MOUTH EVERY DAY 30 capsule 0   eplerenone (INSPRA) 25 MG tablet Take 1 tablet (25 mg total) by mouth daily. 30 tablet 11   KLOR-CON M20 20 MEQ  tablet TAKE 4 TABLETS BY MOUTH EVERY MORNING AND 2 TABLETS DAILY W/ LUNCH AND 4 TABS EVERY EVENING. 900 tablet 1   methocarbamol (ROBAXIN) 500 MG tablet Take 500 mg by mouth 2 (two) times daily as needed for muscle spasms.      NEEDLE, DISP, 22 G 22G X 1" MISC Use to inject testosterone into the muscle once every 14 days. 6 each 3   ranolazine (RANEXA) 500 MG 12 hr tablet TAKE 1 TABLET BY MOUTH TWICE A DAY 180 tablet 3   sacubitril-valsartan (ENTRESTO) 97-103 MG Take 1 tablet by mouth 2 (two) times daily. 60 tablet 6   Semaglutide-Weight Management 2.4 MG/0.75ML SOAJ Inject 2.4 mg into the skin once a week. (Patient not taking: Reported on 06/07/2021) 3 mL 11   sildenafil (VIAGRA) 100 MG tablet  Take 1 tablet (100 mg total) by mouth as needed for erectile dysfunction. 10 tablet 0   torsemide (DEMADEX) 20 MG tablet TAKE 1 TABLET BY MOUTH EVERY DAY 90 tablet 3   No current facility-administered medications for this visit.    REVIEW OF SYSTEMS:    10 Point review of Systems was done is negative except as noted above.  PHYSICAL EXAMINATION: Telemedicine visit   LABORATORY DATA:  I have reviewed the data as listed  .    Latest Ref Rng & Units 10/01/2021   12:07 PM 07/03/2021    9:57 AM 06/12/2021    9:09 AM  CBC  WBC 4.0 - 10.5 K/uL 13.6  11.3  10.8   Hemoglobin 13.0 - 17.0 g/dL 17.5  17.7  19.6 Repeated and verified X2.   Hematocrit 39.0 - 52.0 % 51.1  53.7  59.0 Repeated and verified X2.   Platelets 150 - 400 K/uL 260  229.0  212.0     .    Latest Ref Rng & Units 10/01/2021   12:07 PM 09/20/2020   10:26 AM 05/03/2020   10:48 AM  CMP  Glucose 70 - 99 mg/dL 69  212  122   BUN 6 - 20 mg/dL '23  19  21   '$ Creatinine 0.61 - 1.24 mg/dL 1.07  1.23  1.20   Sodium 135 - 145 mmol/L 138  135  138   Potassium 3.5 - 5.1 mmol/L 4.5  4.6  5.0   Chloride 98 - 111 mmol/L 107  103  104   CO2 22 - 32 mmol/L '26  24  24   '$ Calcium 8.9 - 10.3 mg/dL 9.3  9.0  9.8   Total Protein 6.5 - 8.1 g/dL 6.8  6.7   7.6   Total Bilirubin 0.3 - 1.2 mg/dL 0.7  0.5  0.8   Alkaline Phos 38 - 126 U/L 76  61  62   AST 15 - 41 U/L '14  15  21   '$ ALT 0 - 44 U/L '15  17  23        '$ RADIOGRAPHIC STUDIES: I have personally reviewed the radiological images as listed and agreed with the findings in the report. No results found.  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  Polycythemia vera ruled out with negative clonal molecular markers on testing. Patient has had chronic polycythemia for a long time with multiple secondary risk factors including severe sleep apnea, systolic CHF, hemoconcentration from diuretics, use of testosterone previously earlier this year. Plan I discussed the patient's labs from 10/01/2021 in detail with him. JAK2 [id number] mutation and JAK2 exon 12 mutations negative ruling out polycythemia vera with 23% certainty. We discussed that his polycythemia is likely secondary to suboptimally treated severe sleep apnea, being on diuretic causing hemoconcentration, decreased p.o. fluid intake and with previous use of testosterone. -We discussed that for secondary polycythemia vera the primary treatment is treating the underlying cause.  He was recommended to follow-up with his sleep medicine doctor to optimize his CPAP settings and repeat sleep study if needed.. -Encouraged to drink adequate water. -He is currently off testosterone and we discussed that if he were to be placed back on this that his hematocrit will have to be evaluated closely. -He is currently working on weight loss with his primary care physician which might also help his sleep apnea. -No clear indication for therapeutic phlebotomy at this time.  No specifically defined goals for therapeutic phlebotomy. -If his hematocrit is more than 55 he was suggested that he could potentially donate blood to try to control his hematocrits. -No indication for  additional hematologic work-up at this time  Follow-up Return to clinic with Dr. Irene Limbo as needed  The total time spent in the appointment was 21 minutes*.  All of the patient's questions were answered with apparent satisfaction. The patient knows to call the clinic with any problems, questions or concerns.   Sullivan Lone MD MS AAHIVMS [facility] Hematology/Oncology Physician [facility]  .*Total Encounter Time as defined by the Centers for Medicare and Medicaid Services includes, in addition to the face-to-face time of a patient visit (documented in the note above) non-face-to-face time: obtaining and reviewing outside history, ordering and reviewing medications, tests or procedures, care coordination (communications with other health care professionals or caregivers) and documentation in the medical record.

## 2021-10-21 MED ORDER — SEMAGLUTIDE-WEIGHT MANAGEMENT 2.4 MG/0.75ML ~~LOC~~ SOAJ: 2.4000 mg | SUBCUTANEOUS | 11 refills | Status: DC

## 2021-11-12 ENCOUNTER — Other Ambulatory Visit (HOSPITAL_COMMUNITY): Payer: Self-pay | Admitting: Internal Medicine

## 2021-11-14 ENCOUNTER — Other Ambulatory Visit: Payer: Self-pay | Admitting: Internal Medicine

## 2021-11-18 ENCOUNTER — Ambulatory Visit: Payer: BC Managed Care – PPO | Attending: Cardiology | Admitting: Pharmacist

## 2021-11-18 VITALS — BP 96/76 | HR 97 | Wt 317.0 lb

## 2021-11-18 DIAGNOSIS — I10 Essential (primary) hypertension: Secondary | ICD-10-CM | POA: Diagnosis not present

## 2021-11-18 DIAGNOSIS — E669 Obesity, unspecified: Secondary | ICD-10-CM | POA: Diagnosis not present

## 2021-11-18 DIAGNOSIS — I509 Heart failure, unspecified: Secondary | ICD-10-CM | POA: Diagnosis not present

## 2021-11-18 NOTE — Patient Instructions (Signed)
Please try to increase your exercise If you start having more episodes of feeling dizzy please call Dr. Clayborne Dana office  Call me with any concerns [phone] Continue Wegovy 2.'4mg'$  weekly

## 2021-11-18 NOTE — Progress Notes (Unsigned)
Patient ID: Harold Park                 DOB: March 19, 1965                    MRN: [id number]     HPI: [name] is a 56 y.o. male patient referred to pharmacy clinic by Dr. Haroldine Laws to initiate weight loss therapy with GLP1-RA. PMH is significant for obesity complicated by chronic medical conditions including NICM with EF 23-30%, chronic systolic CHF, parox A-Flutter with RVR (01/2010), HTN, s/p splenectomy, s/p appendectomy and possible sarcoidosis. Patient started on Wegovy in March. Most recent BMI 37.59.  Patient presents today for 6 month follow up. He has not missed any doses. Has lost 24lb since starting Wegovy. 43 lb since he started to try to loose weight in Jan. He has cut out fast food and mountain dew. Doing a better job not eating the snacks at the office. Still not much exercise due to heat and work. Constipation is doing much better. Does get a little lightheaded when standing up in the AM. BP was on the low side today in office.  Has hernia surgery coming up, not scheduled yet though. Knows that Mancel Parsons will have to be held a week prior (or more depending on what surgeon instructions are). Asks about protein powder.  Current weight management medications: Wegovy 2.'4mg'$  weekly  Previously tried meds: none  Current meds that may affect weight: torsemide  Baseline weight/BMI: 341lb/40.4  Insurance payor: [employer]  Diet:  -Breakfast: raisin bran, corn flakes -Lunch: nut/cheese protein snacks -Dinner: chicken, pork chops, steak every couple weeks, salad  -Snacks:  -Drinks: water w/ a little bit of OJ or lemonade mix  Exercise: on his feet showing houses, yard work, no organized exercise, golf once a week, walks 1 mile once or twice a week  Family History:  Family History  Problem Relation Age of Onset   Breast cancer Mother    Cancer Father        blood cancer   Hypertension Other    Heart disease Other    Stomach cancer Neg Hx    Colon cancer Neg Hx    Esophageal  cancer Neg Hx    Pancreatic cancer Neg Hx    Rectal cancer Neg Hx      Social History:  Social History   Socioeconomic History   Marital status: Married    Spouse name: Not on file   Number of children: 3   Years of education: Not on file   Highest education level: Not on file  Occupational History   Occupation: realtor  Tobacco Use   Smoking status: Never   Smokeless tobacco: Never  Vaping Use   Vaping Use: Never used  Substance and Sexual Activity   Alcohol use: Yes    Comment: occasional   Drug use: Never   Sexual activity: Not on file  Other Topics Concern   Not on file  Social History Narrative   Not on file   Social Determinants of Health   Financial Resource Strain: Not on file  Food Insecurity: Not on file  Transportation Needs: Not on file  Physical Activity: Not on file  Stress: Not on file  Social Connections: Not on file  Intimate Partner Violence: Not on file      Labs: Lab Results  Component Value Date   HGBA1C 5.8 (H) 02/02/2019    Wt Readings from Last 1 Encounters:  10/01/21 (!) 321  lb 8 oz (145.8 kg)    BP Readings from Last 1 Encounters:  10/01/21 112/78   Pulse Readings from Last 1 Encounters:  10/01/21 99       Component Value Date/Time   CHOL 158 02/02/2019 1045   TRIG 275 (H) 02/02/2019 1045   HDL 28 (L) 02/02/2019 1045   CHOLHDL 5.6 02/02/2019 1045   VLDL 55 (H) 02/02/2019 1045   [place] 75 02/02/2019 1045    Past Medical History:  Diagnosis Date   Chronic bronchitis    HTN (hypertension)    NICM (nonischemic cardiomyopathy) (HCC)    Obesity (BMI 30-39.9) 12/04/2015   OSA (obstructive sleep apnea) 08/19/2015   Severe with AHI 64/hr now on CPAP at 13cm H2O   PAF (paroxysmal atrial fibrillation) (HCC)    Systolic CHF, chronic (HCC)     Current Outpatient Medications on File Prior to Visit  Medication Sig Dispense Refill   albuterol (VENTOLIN HFA) 108 (90 Base) MCG/ACT inhaler TAKE 2 PUFFS BY MOUTH EVERY 6 HOURS  AS NEEDED FOR WHEEZE OR SHORTNESS OF BREATH 8.5 each 2   apixaban (ELIQUIS) 5 MG TABS tablet Take 1 tablet (5 mg total) by mouth 2 (two) times daily. 60 tablet 6   carvedilol (COREG) 25 MG tablet Take 25 mg by mouth daily in the afternoon.     DULoxetine (CYMBALTA) 20 MG capsule TAKE 1 CAPSULE BY MOUTH EVERY DAY 90 capsule 11   eplerenone (INSPRA) 25 MG tablet Take 1 tablet (25 mg total) by mouth daily. 30 tablet 11   KLOR-CON M20 20 MEQ tablet TAKE 4 TABLETS BY MOUTH EVERY MORNING AND 2 TABLETS DAILY W/ LUNCH AND 4 TABS EVERY EVENING. 900 tablet 1   methocarbamol (ROBAXIN) 500 MG tablet Take 500 mg by mouth 2 (two) times daily as needed for muscle spasms.      NEEDLE, DISP, 22 G 22G X 1" MISC Use to inject testosterone into the muscle once every 14 days. 6 each 3   ranolazine (RANEXA) 500 MG 12 hr tablet TAKE 1 TABLET BY MOUTH TWICE A DAY 180 tablet 3   sacubitril-valsartan (ENTRESTO) 97-103 MG Take 1 tablet by mouth 2 (two) times daily. 60 tablet 6   Semaglutide-Weight Management (WEGOVY) 1.7 MG/0.75ML SOAJ INJECT 1.7 MG INTO THE SKIN ONCE A WEEK FOR 28 DAYS. 3 mL 11   sildenafil (VIAGRA) 100 MG tablet TAKE 1 TABLET BY MOUTH EVERY DAY AS NEEDED FOR ERECTILE DYSFUNCTION 30 tablet 1   torsemide (DEMADEX) 20 MG tablet TAKE 1 TABLET BY MOUTH EVERY DAY 90 tablet 3   No current facility-administered medications on file prior to visit.    Allergies  Allergen Reactions   Amiodarone Nausea Only   Spironolactone      Assessment/Plan:  1. Weight loss - Patient has lost 7% of body weight since starting Wegovy. 11.9% of body weight since Jan. Patient has decreased the amount he is eating and has improved the quality of his food. I advised that protein powder is a safe choice if he cannot get enough protein in with food. I did warn about making fruit smoothies with it (can really consume a lot of calories without trying). I also advised he drink whey protein and check the facts to make sure it is not  high in sugar. I have encouraged him to increase his exercise. Continue Wegovy 2.'4mg'$  weekly. Follow up as needed. I advised he check his blood pressure at home. He does have CHF, therefore will typically allow  a little lower BP if pt tolerates. I advised that if his lightheadedness becomes more frequent to let his MD know.   Thank you,   Ramond Dial, Pharm.D, BCPS, CPP [place]  [address] [place], [place] [address]  Phone: [phone]; Fax: [phone]

## 2021-11-27 ENCOUNTER — Telehealth: Payer: Self-pay | Admitting: Family Medicine

## 2021-11-27 NOTE — Telephone Encounter (Signed)
Pt called to inform MD that he saw the specialist (Hematologist) and was told he can start receiving testosterone shots again.  Pt was scheduled for an OV on Friday (11/29/21) to see MD, to discuss resuming Testosterone shots.  Please let me know if I scheduled Pt correctly.  Respectfully, IC

## 2021-11-28 ENCOUNTER — Encounter (HOSPITAL_BASED_OUTPATIENT_CLINIC_OR_DEPARTMENT_OTHER): Payer: Self-pay | Admitting: Cardiology

## 2021-11-29 ENCOUNTER — Telehealth: Payer: Self-pay | Admitting: Family Medicine

## 2021-11-29 ENCOUNTER — Encounter: Payer: Self-pay | Admitting: Family Medicine

## 2021-11-29 ENCOUNTER — Ambulatory Visit (INDEPENDENT_AMBULATORY_CARE_PROVIDER_SITE_OTHER): Payer: BC Managed Care – PPO | Admitting: Family Medicine

## 2021-11-29 ENCOUNTER — Telehealth (HOSPITAL_COMMUNITY): Payer: Self-pay | Admitting: *Deleted

## 2021-11-29 VITALS — BP 90/60 | HR 50 | Temp 97.8°F | Ht 77.0 in | Wt 315.8 lb

## 2021-11-29 DIAGNOSIS — R7989 Other specified abnormal findings of blood chemistry: Secondary | ICD-10-CM

## 2021-11-29 MED ORDER — TESTOSTERONE CYPIONATE 200 MG/ML IM SOLN: 300.0000 mg | INTRAMUSCULAR | 1 refills | Status: DC

## 2021-11-29 MED ORDER — "BD DISP NEEDLES 20G X 1-1/2"" MISC": 0 refills | Status: DC

## 2021-11-29 NOTE — Progress Notes (Signed)
Established Patient Office Visit  Subjective   Patient ID: Harold Park, male    DOB: 10-25-1965  Age: 56 y.o. MRN: [id number]  Chief Complaint  Patient presents with   Medication Consultation    HPI   Here to discuss possible reinitiation of testosterone therapy.  He has low testosterone and had been on replacement but had polycythemia and even with phlebotomy his hematocrit did not improve much.  We referred hematologist.  Repeat CBC back in July with hemoglobin 17.5, hematocrit 51, and normal platelet count.  Previously took testosterone 200 mg IM every 2 weeks.  His wife was injecting this as she is an Therapist, sports .  He does have significant fatigue and felt some better on testosterone though his levels never reached adequate replacement  History of atrial fibrillation and congestive heart failure.  Also history of obstructive sleep apnea.  Followed by cardiology.  Past Medical History:  Diagnosis Date   Chronic bronchitis    HTN (hypertension)    NICM (nonischemic cardiomyopathy) (HCC)    Obesity (BMI 30-39.9) 12/04/2015   OSA (obstructive sleep apnea) 08/19/2015   Severe with AHI 64/hr now on CPAP at 13cm H2O   PAF (paroxysmal atrial fibrillation) (HCC)    Systolic CHF, chronic ([place])    Past Surgical History:  Procedure Laterality Date   APPENDECTOMY     BRONCHIAL WASHINGS  04/24/2020   Procedure: BRONCHIAL WASHINGS;  Surgeon: Collene Gobble, MD;  Location: [place];  Service: Pulmonary;;   CARDIAC CATHETERIZATION     CARDIOVERSION N/A 06/19/2017   Procedure: CARDIOVERSION;  Surgeon: Jolaine Artist, MD;  Location: [place];  Service: Cardiovascular;  Laterality: N/A;   FINE NEEDLE ASPIRATION  04/24/2020   Procedure: FINE NEEDLE ASPIRATION (FNA) LINEAR;  Surgeon: Collene Gobble, MD;  Location: [facility] ENDOSCOPY;  Service: Pulmonary;;   LIPOMA RESECTION     arms   SPLENECTOMY     SPLIT NIGHT STUDY  08/12/2015   VIDEO BRONCHOSCOPY WITH ENDOBRONCHIAL ULTRASOUND N/A  04/24/2020   Procedure: VIDEO BRONCHOSCOPY WITH ENDOBRONCHIAL ULTRASOUND;  Surgeon: Collene Gobble, MD;  Location: [facility] ENDOSCOPY;  Service: Pulmonary;  Laterality: N/A;    reports that he has never smoked. He has never used smokeless tobacco. He reports current alcohol use. He reports that he does not use drugs. family history includes Breast cancer in his mother; Cancer in his father; Heart disease in an other family member; Hypertension in an other family member. Allergies  Allergen Reactions   Amiodarone Nausea Only   Spironolactone     Review of Systems  Constitutional:  Negative for chills, fever and malaise/fatigue.  Eyes:  Negative for blurred vision.  Respiratory:  Negative for shortness of breath.   Cardiovascular:  Negative for chest pain.  Neurological:  Negative for dizziness, weakness and headaches.      Objective:     BP 90/60 (BP Location: Left Arm, Patient Position: Sitting, Cuff Size: Large)   Pulse (!) 50   Temp 97.8 F (36.6 C) (Oral)   Ht '6\' 5"'$  (1.956 m)   Wt (!) 315 lb 12.8 oz (143.2 kg)   SpO2 97%   BMI 37.45 kg/m    Physical Exam Vitals reviewed.  Constitutional:      Appearance: He is well-developed.  Eyes:     Pupils: Pupils are equal, round, and reactive to light.  Neck:     Thyroid: No thyromegaly.  Cardiovascular:     Rate and Rhythm: Normal rate.  Pulmonary:  Effort: Pulmonary effort is normal. No respiratory distress.     Breath sounds: Normal breath sounds. No wheezing or rales.  Musculoskeletal:     Cervical back: Neck supple.  Neurological:     Mental Status: He is alert and oriented to person, place, and time.      No results found for any visits on 11/29/21.    The 10-year ASCVD risk score (Arnett DK, et al., 2019) is: 5.3%    Assessment & Plan:   Problem List Items Addressed This Visit   None Visit Diagnoses     Low testosterone    -  Primary   Relevant Orders   CBC with Differential/Platelet   PSA    Testosterone     -We discussed risk of testosterone replacement including polycythemia. -We will need to monitor CBC closely and will probably need phlebotomy every couple months -He previously took dose of 200 mg IM every 2 weeks but levels did not go up to normal.  Will reinitiate at 300 mg IM every 14 days. -Repeat labs in 1 month with CBC, PSA, and total testosterone  No follow-ups on file.    Carolann Littler, MD

## 2021-11-29 NOTE — Telephone Encounter (Signed)
Pt left vm stating his bp has been running 90s/60s and he notices some dizziness. Pt said he has lost some weight recently and asked if he needs to make any medication adjustments.  Routed to [place]

## 2021-11-29 NOTE — Telephone Encounter (Signed)
Thank you, Dr. Elease Hashimoto.

## 2021-11-29 NOTE — Patient Instructions (Signed)
Be sure to set up one month labs

## 2021-11-29 NOTE — Telephone Encounter (Signed)
PA sent to plan.  Key: [id number]

## 2021-11-29 NOTE — Telephone Encounter (Signed)
Pt is calling and testosterone cypionate (DEPOTESTOSTERONE CYPIONATE) 200 MG/ML injection need PA please call [phone]  [id number] [place] - [address] Phone:  [phone] Fax:  [phone]

## 2022-01-14 ENCOUNTER — Telehealth: Payer: Self-pay | Admitting: Pharmacist

## 2022-01-14 NOTE — Telephone Encounter (Signed)
Key: [id number] renewal PA sent

## 2022-01-15 ENCOUNTER — Encounter: Payer: Self-pay | Admitting: Emergency Medicine

## 2022-01-15 ENCOUNTER — Ambulatory Visit (INDEPENDENT_AMBULATORY_CARE_PROVIDER_SITE_OTHER): Payer: BC Managed Care – PPO | Admitting: Emergency Medicine

## 2022-01-15 VITALS — BP 134/78 | HR 100 | Temp 98.2°F | Ht 77.0 in | Wt 318.4 lb

## 2022-01-15 DIAGNOSIS — G4733 Obstructive sleep apnea (adult) (pediatric): Secondary | ICD-10-CM | POA: Diagnosis not present

## 2022-01-15 DIAGNOSIS — D869 Sarcoidosis, unspecified: Secondary | ICD-10-CM

## 2022-01-15 NOTE — Assessment & Plan Note (Signed)
Significant obstructive sleep apnea.  He needs a new CPAP machine as his heated humidity no longer works.  His prior CPAP was set on 13 cmH2O.  I will restart him on a range of AutoSet CPAP and then plan to perform an overnight oximetry to ensure that he does not have associated hypoxemia.  If so we may need to repeat a sleep study or add oxygen to his CPAP.  We will order a new CPAP machine for you.  Once you have a new machine we will arrange for an overnight oximetry to check your oxygen levels while you are using it.  Based on this we may decide to make adjustments, possibly repeat your sleep study. Follow with Dr Lamonte Sakai in 3 months or sooner if you have any problems.

## 2022-01-15 NOTE — Assessment & Plan Note (Signed)
Keep your albuterol available to use 2 puffs if needed for shortness of breath, chest tightness, wheezing. We will plan to repeat your CT scan of the chest with contrast in July 2024 to follow your sarcoidosis. You would benefit from seeing an ophthalmologist annually for sarcoidosis screening.

## 2022-01-15 NOTE — Progress Notes (Signed)
Subjective:    Patient ID: Harold Park, male    DOB: Apr 14, 1965, 56 y.o.   MRN: [id number]  HPI  ROV 07/05/20 --follow-up visit 56 year old man with obesity, severe OSA on CPAP whom we have seen for bilateral mediastinal and hilar lymphadenopathy.  Bronchoscopy done 04/24/2020 showed no malignancy and evidence for sarcoidosis.  Cultures all negative.  Past medical history also significant for atrial fibrillation, hypertension with a nonischemic cardiomyopathy for which she is followed in the advanced CHF clinic.  He has been dealing with cough, dyspnea. His cough was bothersome in the Fall, had improved some but now has started back. Non-productive, paroxysmal. He is SOB with long walks, inclines, stairs.   Pulmonary function testing done today reviewed by me, shows mixed obstruction and restriction without a bronchodilator response, restricted lung volumes, normal diffusion capacity.   ROV 01/15/2022 --follow-up visit 56 year old gentleman with a history of obesity, severe OSA on CPAP.  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  All cultures were negative.  I treated him with 3 weeks of prednisone after that diagnosis was made 06/2020.  Last chest imaging was 09/2020. PMH: A-fib, hypertension, nonischemic cardiomyopathy. He needs an new CPAP machine - his is old, the heated humidity no longer works. His last visit w Dr Radford Pax was 09/07/19, looks like he is set on 13 cmH2O. He has lost about 45 lbs in the last year. Was 335 lbs when he last saw Dr Radford Pax.   Never needs albuterol. Good functional capacity. No rash, wheeze, etc.     Review of Systems As per HPI      Objective:   Physical Exam Vitals:   01/15/22 0847  BP: 134/78  Pulse: 100  Temp: 98.2 F (36.8 C)  TempSrc: Oral  SpO2: 94%  Weight: (!) 318 lb 6.4 oz (144.4 kg)  Height: '6\' 5"'$  (1.956 m)    Gen: Pleasant, obese man, in no distress,  normal affect  ENT: No lesions,   mouth clear,  oropharynx clear, no postnasal drip  Neck: No JVD, no stridor, no cervical nodes  Lungs: No use of accessory muscles, no crackles or wheezing on normal respiration, no wheeze on forced expiration  Cardiovascular: RRR, heart sounds normal, no murmur or gallops, no peripheral edema  Musculoskeletal: No deformities, no cyanosis or clubbing  Neuro: alert, awake, non focal  Skin: Warm, no lesions or rash      Assessment & Plan:  OSA (obstructive sleep apnea) Significant obstructive sleep apnea.  He needs a new CPAP machine as his heated humidity no longer works.  His prior CPAP was set on 13 cmH2O.  I will restart him on a range of AutoSet CPAP and then plan to perform an overnight oximetry to ensure that he does not have associated hypoxemia.  If so we may need to repeat a sleep study or add oxygen to his CPAP.  We will order a new CPAP machine for you.  Once you have a new machine we will arrange for an overnight oximetry to check your oxygen levels while you are using it.  Based on this we may decide to make adjustments, possibly repeat your sleep study. Follow with Dr Lamonte Sakai in 3 months or sooner if you have any problems.  Sarcoidosis Keep your albuterol available to use 2 puffs if needed for shortness of breath, chest tightness, wheezing. We will plan to repeat your CT scan of the chest with contrast in July 2024 to follow your  sarcoidosis. You would benefit from seeing an ophthalmologist annually for sarcoidosis screening.  Baltazar Apo, MD, PhD 01/15/2022, 1:58 PM [facility] [phone] or if no answer [phone]

## 2022-01-15 NOTE — Patient Instructions (Signed)
We will order a new CPAP machine for you.  Once you have a new machine we will arrange for an overnight oximetry to check your oxygen levels while you are using it.  Based on this we may decide to make adjustments, possibly repeat your sleep study. Keep your albuterol available to use 2 puffs if needed for shortness of breath, chest tightness, wheezing. We will plan to repeat your CT scan of the chest with contrast in July 2024 to follow your sarcoidosis. You would benefit from seeing an ophthalmologist annually for sarcoidosis screening. Follow with Dr Lamonte Sakai in 3 months or sooner if you have any problems.

## 2022-01-16 ENCOUNTER — Other Ambulatory Visit (HOSPITAL_COMMUNITY): Payer: Self-pay | Admitting: Family Medicine

## 2022-01-16 NOTE — Telephone Encounter (Signed)
Approved through 07/15/22

## 2022-01-28 DIAGNOSIS — G4733 Obstructive sleep apnea (adult) (pediatric): Secondary | ICD-10-CM | POA: Diagnosis not present

## 2022-02-11 ENCOUNTER — Other Ambulatory Visit (HOSPITAL_COMMUNITY): Payer: Self-pay | Admitting: *Deleted

## 2022-02-11 MED ORDER — POTASSIUM CHLORIDE CRYS ER 20 MEQ PO TBCR: EXTENDED_RELEASE_TABLET | ORAL | 1 refills | Status: DC

## 2022-02-21 ENCOUNTER — Other Ambulatory Visit: Payer: Self-pay | Admitting: Emergency Medicine

## 2022-02-27 DIAGNOSIS — G4733 Obstructive sleep apnea (adult) (pediatric): Secondary | ICD-10-CM | POA: Diagnosis not present

## 2022-03-26 ENCOUNTER — Other Ambulatory Visit: Payer: Self-pay | Admitting: Internal Medicine

## 2022-03-28 ENCOUNTER — Telehealth: Payer: Self-pay | Admitting: Pharmacist

## 2022-03-28 MED ORDER — WEGOVY 1.7 MG/0.75ML ~~LOC~~ SOAJ: SUBCUTANEOUS | 11 refills | Status: DC

## 2022-03-28 NOTE — Telephone Encounter (Signed)
Pt c/o medication issue:  1. Name of Medication: Semaglutide-Weight Management (WEGOVY) 1.7 MG/0.75ML SOAJ   2. How are you currently taking this medication (dosage and times per day)?   3. Are you having a reaction (difficulty breathing--STAT)?   4. What is your medication issue? Patient called stating he found a pharmacy that is less expensive for this medication.  He would like to know if he can switch pharmacies.  It is [facility] in [place].

## 2022-03-30 DIAGNOSIS — G4733 Obstructive sleep apnea (adult) (pediatric): Secondary | ICD-10-CM | POA: Diagnosis not present

## 2022-04-01 ENCOUNTER — Encounter: Payer: Self-pay | Admitting: Pharmacist

## 2022-04-02 ENCOUNTER — Telehealth (HOSPITAL_COMMUNITY): Payer: Self-pay

## 2022-04-02 NOTE — Telephone Encounter (Signed)
We do not want this Rx compounded. I called pharmacy and left message to cancel rx out. I asked that they call back to confirm it has been canceled.

## 2022-04-02 NOTE — Telephone Encounter (Signed)
Melissa, the specialty pharmacy called about doing their version of [facility] for him. I see your notes where it looks like you were setting up for him.  Can you follow up on this?

## 2022-04-03 MED ORDER — ZEPBOUND 10 MG/0.5ML ~~LOC~~ SOAJ: 10.0000 mg | SUBCUTANEOUS | 0 refills | Status: DC

## 2022-04-04 ENCOUNTER — Other Ambulatory Visit: Payer: Self-pay | Admitting: Internal Medicine

## 2022-04-11 ENCOUNTER — Telehealth: Payer: Self-pay | Admitting: *Deleted

## 2022-04-11 ENCOUNTER — Other Ambulatory Visit (INDEPENDENT_AMBULATORY_CARE_PROVIDER_SITE_OTHER): Payer: BC Managed Care – PPO

## 2022-04-11 DIAGNOSIS — R7989 Other specified abnormal findings of blood chemistry: Secondary | ICD-10-CM

## 2022-04-11 LAB — CBC WITH DIFFERENTIAL/PLATELET
Basophils Absolute: 0.1 10*3/uL (ref 0.0–0.1)
Basophils Relative: 1.3 % (ref 0.0–3.0)
Eosinophils Absolute: 0.6 10*3/uL (ref 0.0–0.7)
Eosinophils Relative: 5.7 % — ABNORMAL HIGH (ref 0.0–5.0)
HCT: 57.1 % — ABNORMAL HIGH (ref 39.0–52.0)
Hemoglobin: 19 g/dL (ref 13.0–17.0)
Lymphocytes Relative: 22.8 % (ref 12.0–46.0)
Lymphs Abs: 2.3 10*3/uL (ref 0.7–4.0)
MCHC: 33.3 g/dL (ref 30.0–36.0)
MCV: 87.6 fl (ref 78.0–100.0)
Monocytes Absolute: 1 10*3/uL (ref 0.1–1.0)
Monocytes Relative: 10.3 % (ref 3.0–12.0)
Neutro Abs: 6 10*3/uL (ref 1.4–7.7)
Neutrophils Relative %: 59.9 % (ref 43.0–77.0)
Platelets: 246 10*3/uL (ref 150.0–400.0)
RBC: 6.52 Mil/uL — ABNORMAL HIGH (ref 4.22–5.81)
RDW: 16.1 % — ABNORMAL HIGH (ref 11.5–15.5)
WBC: 10 10*3/uL (ref 4.0–10.5)

## 2022-04-11 LAB — TESTOSTERONE: Testosterone: 200.3 ng/dL — ABNORMAL LOW (ref 300.00–890.00)

## 2022-04-11 LAB — PSA: PSA: 1.69 ng/mL (ref 0.10–4.00)

## 2022-04-11 NOTE — Telephone Encounter (Signed)
CRITICAL VALUE STICKER  CRITICAL VALUE: Hemoglobin 19.0  RECEIVER (on-site recipient of call): [place] NOTIFIED: 04/11/2022 at 2:42pm  MESSENGER (representative from lab):Karen at the [facility] lab  MD NOTIFIED:Dr Aransas sent via teams and EPIC  RESPONSE:

## 2022-04-14 NOTE — Telephone Encounter (Signed)
Pt is returning mykal call 

## 2022-04-14 NOTE — Telephone Encounter (Signed)
Please see result note 

## 2022-04-30 DIAGNOSIS — G4733 Obstructive sleep apnea (adult) (pediatric): Secondary | ICD-10-CM | POA: Diagnosis not present

## 2022-05-07 ENCOUNTER — Other Ambulatory Visit: Payer: Self-pay | Admitting: Internal Medicine

## 2022-05-08 NOTE — Telephone Encounter (Signed)
I sent mychart message to see if patient wanted to stay at '10mg'$  or increase to 12.5

## 2022-05-09 MED ORDER — ZEPBOUND 12.5 MG/0.5ML ~~LOC~~ SOAJ: 12.5000 mg | SUBCUTANEOUS | 1 refills | Status: DC

## 2022-05-12 ENCOUNTER — Telehealth: Payer: Self-pay | Admitting: Family Medicine

## 2022-05-12 ENCOUNTER — Other Ambulatory Visit: Payer: Self-pay | Admitting: Cardiology

## 2022-05-12 DIAGNOSIS — D751 Secondary polycythemia: Secondary | ICD-10-CM

## 2022-05-12 NOTE — Telephone Encounter (Signed)
Asking if he should still stop taking Testosterone, says that's where he gets his energy from.  Was switched from wegovy to zepbound. Asking if a prescription for either one could be sent to [place], [place] [facility] [address] Hansel Starling Phone: [phone]  Fax: [phone]

## 2022-05-13 NOTE — Telephone Encounter (Signed)
I spoke with the patient and he reported he did receive phlebotomy about 2 weeks ago. Lab order was placed for CBC and lab appointment scheduled. Patient reported that phlebotomist at [facility] recommended month blood draws due to high HCT levels and patient inquired if PCP thinks this would be ok? Also, Is Zepbound 12.5 mg ok to send?

## 2022-05-13 NOTE — Addendum Note (Signed)
Addended by: Nilda Riggs on: 05/13/2022 01:48 PM   Modules accepted: Orders

## 2022-05-14 ENCOUNTER — Other Ambulatory Visit: Payer: BC Managed Care – PPO

## 2022-05-14 ENCOUNTER — Telehealth: Payer: Self-pay | Admitting: Pharmacist

## 2022-05-14 ENCOUNTER — Telehealth: Payer: Self-pay | Admitting: Family Medicine

## 2022-05-14 DIAGNOSIS — D751 Secondary polycythemia: Secondary | ICD-10-CM

## 2022-05-14 LAB — CBC WITH DIFFERENTIAL/PLATELET
Basophils Absolute: 0.1 10*3/uL (ref 0.0–0.1)
Basophils Relative: 0.9 % (ref 0.0–3.0)
Eosinophils Absolute: 0.7 10*3/uL (ref 0.0–0.7)
Eosinophils Relative: 7.1 % — ABNORMAL HIGH (ref 0.0–5.0)
HCT: 52.1 % — ABNORMAL HIGH (ref 39.0–52.0)
Hemoglobin: 17.1 g/dL — ABNORMAL HIGH (ref 13.0–17.0)
Lymphocytes Relative: 24.3 % (ref 12.0–46.0)
Lymphs Abs: 2.5 10*3/uL (ref 0.7–4.0)
MCHC: 32.9 g/dL (ref 30.0–36.0)
MCV: 87.1 fl (ref 78.0–100.0)
Monocytes Absolute: 1 10*3/uL (ref 0.1–1.0)
Monocytes Relative: 10 % (ref 3.0–12.0)
Neutro Abs: 6 10*3/uL (ref 1.4–7.7)
Neutrophils Relative %: 57.7 % (ref 43.0–77.0)
Platelets: 218 10*3/uL (ref 150.0–400.0)
RBC: 5.99 Mil/uL — ABNORMAL HIGH (ref 4.22–5.81)
RDW: 16.5 % — ABNORMAL HIGH (ref 11.5–15.5)
WBC: 10.4 10*3/uL (ref 4.0–10.5)

## 2022-05-14 MED ORDER — ZEPBOUND 12.5 MG/0.5ML ~~LOC~~ SOAJ: 12.5000 mg | SUBCUTANEOUS | 1 refills | Status: DC

## 2022-05-14 NOTE — Addendum Note (Signed)
Addended by: Nilda Riggs on: 05/14/2022 01:18 PM   Modules accepted: Orders

## 2022-05-14 NOTE — Telephone Encounter (Signed)
[facility] pharm student is calling and they are compounding pharm and rx needs to be written for tirzepatide  pyridoxine '10mg'$ /1 ml  [facility] [place], [place] - [address] [facility] [address] Hansel Starling Phone: [phone]  Fax: [phone]

## 2022-05-14 NOTE — Telephone Encounter (Signed)
See phone note from 3/6. LVM with pt to discuss

## 2022-05-14 NOTE — Telephone Encounter (Signed)
I spoke with the patient and informed him of the message below. Zepbound sent and labs placed for CBC monthly.

## 2022-05-14 NOTE — Telephone Encounter (Signed)
Called pt re his request to send zepbound or wegovy rx to compounding pharmacy. The FDA and the board or pharmacy recommend against compounding of GLP-1. The salt may not be the same and we cannot vouch for the safety of the product.

## 2022-05-15 DIAGNOSIS — G4733 Obstructive sleep apnea (adult) (pediatric): Secondary | ICD-10-CM | POA: Diagnosis not present

## 2022-05-15 NOTE — Telephone Encounter (Signed)
I spoke with the patient and informed him of the message below. Patient reported he is not willing to pay $1200 at [facility] for Zepbound and requested the prescription be sent. I advised the patient that PCP stated that there is a warning in regards to compounding GLP-1 medications but patient insisted a message be sent to see if the prescription can still be called in. He reported that he has friends who are using this compounding pharmacy to receive their Zepbound medication.

## 2022-05-16 ENCOUNTER — Ambulatory Visit (INDEPENDENT_AMBULATORY_CARE_PROVIDER_SITE_OTHER): Payer: BC Managed Care – PPO | Admitting: Emergency Medicine

## 2022-05-16 ENCOUNTER — Encounter: Payer: Self-pay | Admitting: Emergency Medicine

## 2022-05-16 VITALS — BP 140/80 | HR 74 | Temp 98.5°F | Ht 77.0 in | Wt 314.6 lb

## 2022-05-16 DIAGNOSIS — D869 Sarcoidosis, unspecified: Secondary | ICD-10-CM

## 2022-05-16 DIAGNOSIS — G4733 Obstructive sleep apnea (adult) (pediatric): Secondary | ICD-10-CM

## 2022-05-16 NOTE — Progress Notes (Signed)
   Subjective:    Patient ID: Harold Park, male    DOB: 1965/04/21, 57 y.o.   MRN: [id number]  HPI   ROV 05/16/2022 --56 year old male with a history of obesity, severe OSA on CPAP, presumed sarcoidosis after he had granulomatous disease noted on bronchoscopy/EBUS 04/2020.  His cultures at that time were negative.  He was treated with prednisone after.  He is due for repeat CT scan of his chest in July 2024 Past medical history: Atrial fibrillation, hypertension, nonischemic cardiomyopathy. He has lost 60 lbs in the last year (intentional). Has been doing well, feels more energy. Good sleep quality. He has a new CPAP machine.  Breathing well, has not had any cough or wheeze, has not needed albuterol. Sometimes some mucous to clear in the am. No flares.   CPAP compliance data available between 2/7-05/15/18/2024.  This shows that he has used the device for greater than 4 hours 93% of the time, AutoSet on a range of 10-20 cmH2O, average pressure 10.5-11 cmH2O with good control of his obstructive events.   Review of Systems As per HPI      Objective:   Physical Exam Vitals:   05/16/22 1136  BP: (!) 140/80  Pulse: 74  Temp: 98.5 F (36.9 C)  TempSrc: Oral  SpO2: 98%  Weight: (!) 314 lb 9.6 oz (142.7 kg)  Height: 6\' 5"  (1.956 m)    Gen: Pleasant, obese man, in no distress,  normal affect  ENT: No lesions,  mouth clear,  oropharynx clear, no postnasal drip  Neck: No JVD, no stridor, no cervical nodes  Lungs: No use of accessory muscles, no crackles or wheezing on normal respiration, no wheeze on forced expiration  Cardiovascular: RRR, heart sounds normal, no murmur or gallops, no peripheral edema  Musculoskeletal: No deformities, no cyanosis or clubbing  Neuro: alert, awake, non focal  Skin: Warm, no lesions or rash      Assessment & Plan:  Sarcoidosis He is following with ophthalmology as advised.  No rash, no lymphadenopathy noted.  Needs a repeat CT scan of his chest in  July to follow for interval stability of his lymphadenopathy, screen for any parenchymal disease.  Interestingly he notes a family history of multiple myeloma, amyloidosis.  I did reassure him that we do not have any evidence of a lymphoproliferative disorder on his evaluation to date.  Has albuterol available to use if needed.  OSA (obstructive sleep apnea) We ordered him a new machine, now on AutoSet 10-20 cmH2O.  Established good compliance today, good clinical benefit.  We will check an overnight oximetry on CPAP to ensure that he does not have occult desaturations.  If he does then we will start oxygen bled in with his CPAP.  Baltazar Apo, MD, PhD 05/16/2022, 12:11 PM [facility] [phone] or if no answer [phone]

## 2022-05-16 NOTE — Patient Instructions (Signed)
Please continue to use your CPAP every night as you have been doing. We will perform an overnight oximetry on CPAP to ensure that your oxygen level does not drop while you are using the device Keep albuterol available to use 2 puffs if needed for shortness of breath, chest tightness, wheezing. We will perform a repeat CT scan of the chest in July 2024 to follow sarcoidosis and enlarged lymph nodes Follow Dr. Lamonte Sakai in July after your CT so we can review the results together.

## 2022-05-16 NOTE — Assessment & Plan Note (Signed)
He is following with ophthalmology as advised.  No rash, no lymphadenopathy noted.  Needs a repeat CT scan of his chest in July to follow for interval stability of his lymphadenopathy, screen for any parenchymal disease.  Interestingly he notes a family history of multiple myeloma, amyloidosis.  I did reassure him that we do not have any evidence of a lymphoproliferative disorder on his evaluation to date.  Has albuterol available to use if needed.

## 2022-05-16 NOTE — Assessment & Plan Note (Signed)
We ordered him a new machine, now on AutoSet 10-20 cmH2O.  Established good compliance today, good clinical benefit.  We will check an overnight oximetry on CPAP to ensure that he does not have occult desaturations.  If he does then we will start oxygen bled in with his CPAP.

## 2022-05-29 DIAGNOSIS — G4733 Obstructive sleep apnea (adult) (pediatric): Secondary | ICD-10-CM | POA: Diagnosis not present

## 2022-05-29 NOTE — Telephone Encounter (Signed)
[id number] [place] for Zepbound submitted

## 2022-06-15 DIAGNOSIS — G4733 Obstructive sleep apnea (adult) (pediatric): Secondary | ICD-10-CM | POA: Diagnosis not present

## 2022-06-24 ENCOUNTER — Other Ambulatory Visit: Payer: Self-pay | Admitting: Internal Medicine

## 2022-06-26 MED ORDER — ZEPBOUND 12.5 MG/0.5ML ~~LOC~~ SOAJ: 12.5000 mg | SUBCUTANEOUS | 11 refills | Status: DC

## 2022-06-26 NOTE — Addendum Note (Signed)
Addended by: Malena Peer D on: 06/26/2022 09:09 AM   Modules accepted: Orders

## 2022-06-29 DIAGNOSIS — G4733 Obstructive sleep apnea (adult) (pediatric): Secondary | ICD-10-CM | POA: Diagnosis not present

## 2022-07-04 ENCOUNTER — Telehealth: Payer: Self-pay | Admitting: Pharmacist

## 2022-07-04 MED ORDER — ZEPBOUND 15 MG/0.5ML ~~LOC~~ SOAJ: 15.0000 mg | SUBCUTANEOUS | 0 refills | Status: DC

## 2022-07-04 NOTE — Telephone Encounter (Signed)
Patient called about his appeal for Zepbound. Did not know he wanted [place] to appeal as he can use the $550 card since his insurance denied it. He was changed from [facility] to Zepbound bc he didn't want to pay his high deductible. No saying the $550 doesn't go towards his deductible, which we were aware of. Advised to patient that we will appeal if he wants but if he hasn't paid down his deductible it will still be >$1000. Patient wants to see how much of his deductible he has paid.  He also wants to try to go to 15mg  to see if its easier to get I sent in Rx He states he feels much better on Zepbound vs Wegovy.

## 2022-07-15 DIAGNOSIS — G4733 Obstructive sleep apnea (adult) (pediatric): Secondary | ICD-10-CM | POA: Diagnosis not present

## 2022-07-29 DIAGNOSIS — G4733 Obstructive sleep apnea (adult) (pediatric): Secondary | ICD-10-CM | POA: Diagnosis not present

## 2022-08-13 DIAGNOSIS — G4733 Obstructive sleep apnea (adult) (pediatric): Secondary | ICD-10-CM | POA: Diagnosis not present

## 2022-08-15 DIAGNOSIS — G4733 Obstructive sleep apnea (adult) (pediatric): Secondary | ICD-10-CM | POA: Diagnosis not present

## 2022-08-29 DIAGNOSIS — G4733 Obstructive sleep apnea (adult) (pediatric): Secondary | ICD-10-CM | POA: Diagnosis not present

## 2022-09-04 ENCOUNTER — Other Ambulatory Visit: Payer: Self-pay

## 2022-09-05 MED ORDER — CARVEDILOL 25 MG PO TABS: 25.0000 mg | ORAL_TABLET | Freq: Every day | ORAL | 1 refills | Status: DC

## 2022-09-12 DIAGNOSIS — G4733 Obstructive sleep apnea (adult) (pediatric): Secondary | ICD-10-CM | POA: Diagnosis not present

## 2022-09-17 ENCOUNTER — Ambulatory Visit (HOSPITAL_BASED_OUTPATIENT_CLINIC_OR_DEPARTMENT_OTHER): Payer: BC Managed Care – PPO

## 2022-09-28 DIAGNOSIS — G4733 Obstructive sleep apnea (adult) (pediatric): Secondary | ICD-10-CM | POA: Diagnosis not present

## 2022-09-29 ENCOUNTER — Ambulatory Visit (HOSPITAL_COMMUNITY)
Admission: RE | Admit: 2022-09-29 | Discharge: 2022-09-29 | Disposition: A | Discharge status: Home / Self Care | Payer: BC Managed Care – PPO | Source: Ambulatory Visit | Attending: Cardiology | Admitting: Cardiology

## 2022-09-29 ENCOUNTER — Telehealth (HOSPITAL_COMMUNITY): Payer: Self-pay | Admitting: Cardiology

## 2022-09-29 ENCOUNTER — Encounter (HOSPITAL_COMMUNITY): Payer: Self-pay

## 2022-09-29 ENCOUNTER — Other Ambulatory Visit (HOSPITAL_COMMUNITY): Payer: Self-pay | Admitting: *Deleted

## 2022-09-29 VITALS — BP 99/65 | HR 140 | Wt 331.0 lb

## 2022-09-29 DIAGNOSIS — R0602 Shortness of breath: Secondary | ICD-10-CM | POA: Diagnosis not present

## 2022-09-29 DIAGNOSIS — I110 Hypertensive heart disease with heart failure: Secondary | ICD-10-CM | POA: Diagnosis not present

## 2022-09-29 DIAGNOSIS — I5023 Acute on chronic systolic (congestive) heart failure: Secondary | ICD-10-CM | POA: Diagnosis not present

## 2022-09-29 DIAGNOSIS — Z7901 Long term (current) use of anticoagulants: Secondary | ICD-10-CM | POA: Insufficient documentation

## 2022-09-29 DIAGNOSIS — I480 Paroxysmal atrial fibrillation: Secondary | ICD-10-CM | POA: Insufficient documentation

## 2022-09-29 DIAGNOSIS — I493 Ventricular premature depolarization: Secondary | ICD-10-CM | POA: Diagnosis not present

## 2022-09-29 DIAGNOSIS — I4891 Unspecified atrial fibrillation: Secondary | ICD-10-CM

## 2022-09-29 DIAGNOSIS — R112 Nausea with vomiting, unspecified: Secondary | ICD-10-CM | POA: Insufficient documentation

## 2022-09-29 DIAGNOSIS — R000 Tachycardia, unspecified: Secondary | ICD-10-CM | POA: Diagnosis not present

## 2022-09-29 DIAGNOSIS — I272 Pulmonary hypertension, unspecified: Secondary | ICD-10-CM | POA: Insufficient documentation

## 2022-09-29 DIAGNOSIS — G4733 Obstructive sleep apnea (adult) (pediatric): Secondary | ICD-10-CM | POA: Diagnosis not present

## 2022-09-29 DIAGNOSIS — Z6839 Body mass index (BMI) 39.0-39.9, adult: Secondary | ICD-10-CM | POA: Diagnosis not present

## 2022-09-29 DIAGNOSIS — Z79899 Other long term (current) drug therapy: Secondary | ICD-10-CM | POA: Insufficient documentation

## 2022-09-29 DIAGNOSIS — I428 Other cardiomyopathies: Secondary | ICD-10-CM | POA: Insufficient documentation

## 2022-09-29 DIAGNOSIS — I5022 Chronic systolic (congestive) heart failure: Secondary | ICD-10-CM

## 2022-09-29 DIAGNOSIS — D860 Sarcoidosis of lung: Secondary | ICD-10-CM | POA: Diagnosis not present

## 2022-09-29 DIAGNOSIS — R079 Chest pain, unspecified: Secondary | ICD-10-CM | POA: Diagnosis not present

## 2022-09-29 MED ORDER — FUROSCIX 80 MG/10ML ~~LOC~~ CTKT
80.0000 mg | CARTRIDGE | SUBCUTANEOUS | Status: DC
Start: 2022-09-29 — End: 2022-10-01

## 2022-09-29 MED ORDER — TORSEMIDE 20 MG PO TABS: 20.0000 mg | ORAL_TABLET | Freq: Two times a day (BID) | ORAL | Status: DC

## 2022-09-29 NOTE — Telephone Encounter (Signed)
Patient called to report apple watch alerts of afib. Reports he has been feeling 'wonky" for a few days. Requests EKG and or OV  Palps off and on No vitals to report

## 2022-09-29 NOTE — Progress Notes (Signed)
Medication Samples have been provided to the patient.  Drug name: Furoscix       Strength: 80mg         Qty: 1  LOT: [id number]  Exp.Date: 11/08/22  Dosing instructions: Take 1 dose today as directed  The patient has been instructed regarding the correct time, dose, and frequency of taking this medication, including desired effects and most common side effects.     4:29 PM 09/29/2022

## 2022-09-29 NOTE — Patient Instructions (Signed)
Medication Changes:  Your provider has order Furoscix for you. This is an on-body infuser that gives you a dose of Furosemide.   It will be shipped to your home   [employer] will call you to discuss before shipping so, PLEASE answer unknown calls  For questions regarding the device call [employer] at [phone]  Ensure you write down the time you start your infusion so that if there is a problem you will know how long the infusion lasted  Use Furoscix only AS DIRECTED by our office  Dosing Directions:   Day 1= TODAY 09/29/22 Use 1 Furoscix Kit and take an additional 40 meq (2 tabs) of Potassium  Day 2= Tuesday 09/30/22 Increase Torsemide to 20 mg Twice daily   Day 3= Wednesday 10/01/22 Take Torsemide 20 mg in AM and come to office visit at 9:30 AM   Lab Work:  Labs done today, your results will be available in MyChart, we will contact you for abnormal readings.  Testing/Procedures:  Your physician has recommended that you have a Cardioversion (DCCV). Electrical Cardioversion uses a jolt of electricity to your heart either through paddles or wired patches attached to your chest. This is a controlled, usually prescheduled, procedure. Defibrillation is done under light anesthesia in the hospital, and you usually go home the day of the procedure. This is done to get your heart back into a normal rhythm. You are not awake for the procedure. Please see the instructions below.  Special Instructions // Education:  Do the following things EVERYDAY: Weigh yourself in the morning before breakfast. Write it down and keep it in a log. Take your medicines as prescribed Eat low salt foods--Limit salt (sodium) to 2000 mg per day.  Stay as active as you can everyday Limit all fluids for the day to less than 2 liters   CARDIOVERSION INSTRUCTIONS: You are scheduled for a Cardioversion on Thursday, July 25 with Dr. Gala Romney.    Please arrive at the [facility] (Main Entrance A) at  [facility]: [address] [place], [place] [address] at 1:30 PM (This time is 1 hour(s) before your procedure to ensure your preparation). Free valet parking service is available. You will check in at ADMITTING. The support person will be asked to wait in the waiting room.  It is OK to have someone drop you off and come back when you are ready to be discharged.     DIET:  Nothing to eat or drink after midnight except a sip of water with medications (see medication instructions below)  MEDICATION INSTRUCTIONS: !!IF ANY NEW MEDICATIONS ARE STARTED AFTER TODAY, PLEASE NOTIFY YOUR PROVIDER AS SOON AS POSSIBLE!!        :1}Continue taking your anticoagulant (blood thinner): Apixaban (Eliquis). PLEASE DO NOT MISS ANY DOSES  LABS: Done today  You must have a responsible person to drive you home and stay in the waiting area during your procedure. Failure to do so could result in cancellation.  Bring your insurance cards.  *Special Note: Every effort is made to have your procedure done on time. Occasionally there are emergencies that occur at the hospital that may cause delays. Please be patient if a delay does occur.    Follow-Up in: 2 days (Wed 10/01/22 at 9:30 am)  At the [facility], you and your health needs are our priority. We have a designated team specialized in the treatment of Heart Failure. This Care Team includes your primary Heart Failure Specialized Cardiologist (  physician), Advanced Practice Providers (APPs- Physician Assistants and Nurse Practitioners), and Pharmacist who all work together to provide you with the care you need, when you need it.   You may see any of the following providers on your designated Care Team at your next follow up:  Dr. Arvilla Meres Dr. Marca Ancona Dr. Marcos Eke, NP Robbie Lis, [place] [facility] [place], [place] Brynda Peon, NP Karle Plumber, PharmD   Please be sure to bring in all your  medications bottles to every appointment.   Need to Contact us:  If you have any questions or concerns before your next appointment please send [place] a message through [place] or call our office at [phone].    TO LEAVE A MESSAGE FOR THE NURSE SELECT OPTION 2, PLEASE LEAVE A MESSAGE INCLUDING: YOUR NAME DATE OF BIRTH CALL BACK NUMBER REASON FOR CALL**this is important as we prioritize the call backs  YOU WILL RECEIVE A CALL BACK THE SAME DAY AS LONG AS YOU CALL BEFORE 4:00 PM

## 2022-09-29 NOTE — Progress Notes (Signed)
PCP: Dr Caryl Never Primary HF Cardiologist: Dr Gala Romney  Pulmonary: Dr Delton Coombes.   HPI: Harold Park is a 57 y.o. male with NICM with EF 30-35%, chronic systolic CHF, parox A-Flutter with RVR (01/2010) and HTN. He had a cath in 01/2010 demonstrating normal cors, EF 25-30% and moderate pulmonary HTN. Echo done in 01/2010 demonstrated EF 30-35%, mild MR, mild LAE, mild RVE.   Found to be in AF 4/17. Started on eliquis. TEE/DCCV scheduled. Was in NSR on his arrival.   We saw him for the first time in May 2017. At that time we switched losartan to [facility] and referred to Reuel Derby and had + sleep study with severe OSA 64/hr. Also saw Dr. Graciela Husbands in EP. Holter monitor placed to quantify PVCs---> - 6.3%. Unable to tolerate amio in past due to severe nausea. Remains on Ranexa 500 bid to try to suppress PVCs.    Underwent DC-CV on 06/19/17 for recurrent AF Seen in AF Clinic on 07/02/17. Felt not to be ideal candidate for AF ablation   Echo 12/21 EF 40-45%   CT angio in 2019 aortic root was 4.6 cm   CT chest 03/06/20 to evaluate hilar fullness seen on CXR. Interval development of numerous enlarged mediastinal and bilateral hilar lymph nodes   CT coronary 03/06/20. Calcium score 0. Poor arterial imaging but no obvious CAD   Underwent bronchoscopy with biopsy of mediastinal nodes. 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 Treated with prednisone.   Saw Dr Gala Romney 05/2021. Stable at that time.. Torsemide was cut back to 20 mg daily.   He presents today for an acute heart failure visit. Started feeling bad on Thursday.  Apple watch started alarming for elevated heart rate. Last Thursday and Friday he took extra entresto and torsemide. SOB with exertion. Orthopnea. Denies PND. Appetite ok. No fever or chills. Weight at home has gone up 15 pounds in the last 10 pounds. Home weight went up from 310--->332 pounds. He did medications prior to his visit but plans to take when he gets home.   ECHO  2023 EF 35-40% RV  normal  2020 EF 35-40%. RV ok. No TR. AoRoot 4.1 cm 2019 EF 35-40%. RV ok. 2018 EF 35% Grade IDD RV normal. 2017 EF 35-40%   2016 LVEF 30-35%, trivial AI, Mod LAE  PET 2023  1. Abnormal apical myocardial perfusion.   2. Abnormal left ventricular systolic function, wall motion, apical thickening, and dilated LV.   Non-diagnostic CT obtained for PET attenuation correction:   1. There are no discernible coronary artery calcifications.   2. There are areas of extracardiac hypermetabolic activity noted on the limited field of view.This includes mediastinum and hilar lymphnodes. This could represent extra cardiac sarcoidosis or other process.If clinically concern a diagnostic whole  body PET/CT may be obtained for further evaluation.   ROS: All systems negative except as listed in HPI, PMH and Problem List.  SH:  Social History   Socioeconomic History   Marital status: Married    Spouse name: Not on file   Number of children: 3   Years of education: Not on file   Highest education level: Not on file  Occupational History   Occupation: realtor  Tobacco Use   Smoking status: Never   Smokeless tobacco: Never  Vaping Use   Vaping status: Never Used  Substance and Sexual Activity   Alcohol use: Yes    Comment: occasional   Drug use: Never   Sexual activity: Not on file  Other Topics  Concern   Not on file  Social History Narrative   Not on file   Social Determinants of Health   Financial Resource Strain: Not on file  Food Insecurity: Not on file  Transportation Needs: Not on file  Physical Activity: Not on file  Stress: Not on file  Social Connections: Not on file  Intimate Partner Violence: Not on file    FH:  Family History  Problem Relation Age of Onset   Breast cancer Mother    Cancer Father        blood cancer   Hypertension Other    Heart disease Other    Stomach cancer Neg Hx    Colon cancer Neg Hx    Esophageal cancer Neg Hx    Pancreatic cancer Neg Hx     Rectal cancer Neg Hx     Past Medical History:  Diagnosis Date   Chronic bronchitis    HTN (hypertension)    NICM (nonischemic cardiomyopathy) (HCC)    Obesity (BMI 30-39.9) 12/04/2015   OSA (obstructive sleep apnea) 08/19/2015   Severe with AHI 64/hr now on CPAP at 13cm H2O   PAF (paroxysmal atrial fibrillation) (HCC)    Systolic CHF, chronic (HCC)     Current Outpatient Medications  Medication Sig Dispense Refill   apixaban (ELIQUIS) 5 MG TABS tablet Take 1 tablet (5 mg total) by mouth 2 (two) times daily. 60 tablet 6   carvedilol (COREG) 25 MG tablet Take 1 tablet (25 mg total) by mouth daily in the afternoon. (Patient taking differently: Take 12.5 mg by mouth 2 (two) times daily with a meal.) 90 tablet 1   DULoxetine (CYMBALTA) 20 MG capsule TAKE 1 CAPSULE BY MOUTH EVERY DAY 90 capsule 11   eplerenone (INSPRA) 25 MG tablet Take 1 tablet (25 mg total) by mouth daily. 30 tablet 11   NEEDLE, DISP, 20 G (BD DISP NEEDLES) 20G X 1-1/2" MISC Use as directed 10 each 0   NEEDLE, DISP, 22 G 22G X 1" MISC Use to inject testosterone into the muscle once every 14 days. 6 each 3   potassium chloride SA (KLOR-CON M20) 20 MEQ tablet TAKE 4 TABLETS BY MOUTH EVERY MORNING AND 2 TABLETS DAILY W/ LUNCH AND 4 TABS EVERY EVENING. 900 tablet 1   ranolazine (RANEXA) 500 MG 12 hr tablet TAKE 1 TABLET BY MOUTH TWICE A DAY 180 tablet 3   sacubitril-valsartan (ENTRESTO) 97-103 MG Take 1 tablet by mouth 2 (two) times daily. 60 tablet 6   sildenafil (VIAGRA) 100 MG tablet TAKE 1 TABLET BY MOUTH EVERY DAY AS NEEDED FOR ERECTILE DYSFUNCTION 30 tablet 1   testosterone cypionate (DEPOTESTOSTERONE CYPIONATE) 200 MG/ML injection Inject 1.5 mLs (300 mg total) into the muscle every 14 (fourteen) days. 10 mL 1   torsemide (DEMADEX) 20 MG tablet TAKE 1 TABLET BY MOUTH EVERY DAY 90 tablet 3   albuterol (VENTOLIN HFA) 108 (90 Base) MCG/ACT inhaler TAKE 2 PUFFS BY MOUTH EVERY 6 HOURS AS NEEDED FOR WHEEZE OR SHORTNESS OF BREATH  (Patient not taking: Reported on 09/29/2022) 8.5 each 2   methocarbamol (ROBAXIN) 500 MG tablet Take 500 mg by mouth 2 (two) times daily as needed for muscle spasms.  (Patient not taking: Reported on 09/29/2022)     No current facility-administered medications for this encounter.    Vitals:   09/29/22 1517  BP: 99/65  Pulse: (!) 140  SpO2: 95%  Weight: (!) 150.1 kg (331 lb)   Wt Readings from Last 3  Encounters:  09/29/22 (!) 150.1 kg (331 lb)  05/16/22 (!) 142.7 kg (314 lb 9.6 oz)  01/15/22 (!) 144.4 kg (318 lb 6.4 oz)    PHYSICAL EXAM: General:  No resp difficulty HEENT: normal Neck: supple. JVP 10-11  Carotids 2+ bilat; no bruits. No lymphadenopathy or thryomegaly appreciated. Cor: PMI nondisplaced. Tachy Irregular rate & rhythm. No rubs, gallops or murmurs. Lungs: clear Abdomen: soft, nontender, nondistended. No hepatosplenomegaly. No bruits or masses. Good bowel sounds. Extremities: no cyanosis, clubbing, rash, R and LLE 1+ edema Neuro: alert & orientedx3, cranial nerves grossly intact. moves all 4 extremities w/o difficulty. Affect pleasant  ECG: A fib 140 bpm    ASSESSMENT & PLAN: 1. Acute on Chronic systolic HF - NICM  - Cardiac CT 11/10. EF 64% no CAD - cath in 01/2010 demonstrating normal cors. EF 25-30% -Cardiac CTA 12/21 Calcium score = 0 No obvious CAD (poor images) - Most recent ECHO 2023 EF 35-40% RV normal.  - Cardiac CTA 12/21 Calcium score = 0 No obvious CAD (poor images) - NYHA III. Failed outpatient oral diuretics. Volume status trending up suspect in the setting of recurrent A fib.  FUROSCIX prescribed will need back for future.  Patient viewed patient education video with QR code for Lenor Coffin code for FUROSCIX placed on AVS  Call FUROSCIX Direct at [phone] for questions regarding on body infuser.  Day 1 >09/29/22  FUROSCIX 80 mg once daily  via on body infuser + extra  KDUR 40 meq   Day 2 >09/30/22  Increase Torsemide 20 mg twice a day.    Day 3 > 10/01/22 Continue torsemide 20 mg twice a day and will adjust at your follow up appointment.   - Continue carvedilol 25 mg twice a day.  - Continue entresto 97-103 mg twice a day. Hold entresto if SBP < 100.  - Continue eplerenone - Given results of mediastinal biopsy with ? 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 Has not had arrhythmias.  Check labs today.    2. Chest pain - Cardiac CTA 12/21 Calcium score = 0 No obvious CAD (poor images) - No chest pain..   3. PAF - s/p DC-CV 06/19/17 - has been seen in AF Clinic. Felt that he needs to lose weight prior to considering AF ablation. He is working on weight loss.  -In A fib RVR today.   -Continue Eliquis. Discussed importance of twice daily dosing. No bleeding currently  - Intolerant amiodarone---> nausea/vomiting. Remains on Ranexa + coreg.   - Attempt to diurese . See back on Wednesday if remain in A fib RVR will need cardioversion on Thursday.    4. HTN - Running lower while in A fib RVR. He has BP cuff and I have asked him to take his BP daily. See above.    5. Morbid Obesity  - Body mass index is 39.25 kg/m. -Previously on GLP1 but insurance has not been covering.    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 4.3 cm previously - CT chest 11/19 Ao Root 4.6cm. Remainder of aorta ok  - Repeat CT 12/21: Ascending thoracic aorta measures 3.8 cm in diameter, mid aortic arch measures 3.0 cm in diameter, and descending thoracic aorta measures 2.3 cm in diameter. - CT 7/22 "No significant vascular findings" - Repeat echo   7. Severe OSA (64/hr)  - Followed by Dr. Mayford Knife. Good control.  - Continue CPAP.    8. 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 Had CT set up this month.   Check CBC, BMET, and BNP  Return to clinic on Wednesday for follow up. If he remains in A fib will need cardioversion on Thursday.       NP-C  3:57 PM

## 2022-09-29 NOTE — Telephone Encounter (Signed)
Pt aware  Nurse visit 7/22 2 230

## 2022-09-29 NOTE — Addendum Note (Signed)
Encounter addended by: Noralee Space, RN on: 09/29/2022 4:31 PM  Actions taken: Clinical Note Signed

## 2022-09-29 NOTE — Progress Notes (Signed)
Provided patient education on Furoscix using demo kits and Furoscix video, QR code provided on AVS for further viewing. Furoscix order form completed online, ins and Retail buyer.

## 2022-09-29 NOTE — Addendum Note (Signed)
Encounter addended by: Noralee Space, RN on: 09/29/2022 4:09 PM  Actions taken: Clinical Note Signed

## 2022-09-30 NOTE — H&P (View-Only) (Signed)
PCP: Dr Caryl Never Primary HF Cardiologist: Dr Gala Romney  Pulmonary: Dr Delton Coombes.   HPI: Harold Park is a 57 y.o. male with NICM with EF 30-35%, chronic systolic CHF, parox A-Flutter with RVR (01/2010) and HTN. He had a cath in 01/2010 demonstrating normal cors, EF 25-30% and moderate pulmonary HTN. Echo done in 01/2010 demonstrated EF 30-35%, mild MR, mild LAE, mild RVE.   Found to be in AF 4/17. Started on eliquis. TEE/DCCV scheduled. Was in NSR on his arrival.   We saw him for the first time in May 2017. At that time we switched losartan to [facility] and referred to Reuel Derby and had + sleep study with severe OSA 64/hr. Also saw Dr. Graciela Husbands in EP. Holter monitor placed to quantify PVCs---> - 6.3%. Unable to tolerate amio in past due to severe nausea. Remains on Ranexa 500 bid to try to suppress PVCs.    Underwent DC-CV on 06/19/17 for recurrent AF Seen in AF Clinic on 07/02/17. Felt not to be ideal candidate for AF ablation   Echo 12/21 EF 40-45%   CT angio in 2019 aortic root was 4.6 cm   CT chest 03/06/20 to evaluate hilar fullness seen on CXR. Interval development of numerous enlarged mediastinal and bilateral hilar lymph nodes   CT coronary 03/06/20. Calcium score 0. Poor arterial imaging but no obvious CAD   Underwent bronchoscopy with biopsy of mediastinal nodes. 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 Treated with prednisone.   Saw Dr Gala Romney 05/2021. Stable at that time.. Torsemide was cut back to 20 mg daily.   He was seen in th HF 09/29/22 and was back in A fib and volume overloaded. Started on Furoscix and torsemide was increased to 20 mg twice a day. He took Furoscix 09/29/22. Had good response with increased urine ouput.   Today he returns for HF follow up.Overall feeling better.  Able to sleep. Can tolerate CPAP.Denies SOB/PND/Orthopnea. Appetite ok. No fever or chills. Weight at home down from 332--->317  pounds. Taking all medications but did not take prior to the visit. He cut back  coreg to 12.5 mg twice a day and entresto to 49-51 mg twice a day. He has been taking eliquis twice a day.   ECHO  2023 EF 35-40% RV normal  2020 EF 35-40%. RV ok. No TR. AoRoot 4.1 cm 2019 EF 35-40%. RV ok. 2018 EF 35% Grade IDD RV normal. 2017 EF 35-40%   2016 LVEF 30-35%, trivial AI, Mod LAE  PET 2023  1. Abnormal apical myocardial perfusion.   2. Abnormal left ventricular systolic function, wall motion, apical thickening, and dilated LV.   Non-diagnostic CT obtained for PET attenuation correction:   1. There are no discernible coronary artery calcifications.   2. There are areas of extracardiac hypermetabolic activity noted on the limited field of view.This includes mediastinum and hilar lymphnodes. This could represent extra cardiac sarcoidosis or other process.If clinically concern a diagnostic whole  body PET/CT may be obtained for further evaluation.   ROS: All systems negative except as listed in HPI, PMH and Problem List.  SH:  Social History   Socioeconomic History   Marital status: Married    Spouse name: Not on file   Number of children: 3   Years of education: Not on file   Highest education level: Not on file  Occupational History   Occupation: realtor  Tobacco Use   Smoking status: Never   Smokeless tobacco: Never  Vaping Use   Vaping status: Never Used  Substance and Sexual Activity   Alcohol use: Yes    Comment: occasional   Drug use: Never   Sexual activity: Not on file  Other Topics Concern   Not on file  Social History Narrative   Not on file   Social Determinants of Health   Financial Resource Strain: Not on file  Food Insecurity: Not on file  Transportation Needs: Not on file  Physical Activity: Not on file  Stress: Not on file  Social Connections: Not on file  Intimate Partner Violence: Not on file    FH:  Family History  Problem Relation Age of Onset   Breast cancer Mother    Cancer Father        blood cancer   Hypertension Other     Heart disease Other    Stomach cancer Neg Hx    Colon cancer Neg Hx    Esophageal cancer Neg Hx    Pancreatic cancer Neg Hx    Rectal cancer Neg Hx     Past Medical History:  Diagnosis Date   Chronic bronchitis    HTN (hypertension)    NICM (nonischemic cardiomyopathy) (HCC)    Obesity (BMI 30-39.9) 12/04/2015   OSA (obstructive sleep apnea) 08/19/2015   Severe with AHI 64/hr now on CPAP at 13cm H2O   PAF (paroxysmal atrial fibrillation) (HCC)    Systolic CHF, chronic (HCC)     Current Outpatient Medications  Medication Sig Dispense Refill   apixaban (ELIQUIS) 5 MG TABS tablet Take 1 tablet (5 mg total) by mouth 2 (two) times daily. 60 tablet 6   carvedilol (COREG) 25 MG tablet Take 1 tablet (25 mg total) by mouth daily in the afternoon. (Patient taking differently: Take 12.5 mg by mouth 2 (two) times daily with a meal.) 90 tablet 1   DULoxetine (CYMBALTA) 20 MG capsule TAKE 1 CAPSULE BY MOUTH EVERY DAY 90 capsule 11   Furosemide (FUROSCIX) 80 MG/10ML CTKT Inject 80 mg into the skin as directed.     NEEDLE, DISP, 20 G (BD DISP NEEDLES) 20G X 1-1/2" MISC Use as directed 10 each 0   NEEDLE, DISP, 22 G 22G X 1" MISC Use to inject testosterone into the muscle once every 14 days. 6 each 3   potassium chloride SA (KLOR-CON M20) 20 MEQ tablet TAKE 4 TABLETS BY MOUTH EVERY MORNING AND 2 TABLETS DAILY W/ LUNCH AND 4 TABS EVERY EVENING. 900 tablet 1   ranolazine (RANEXA) 500 MG 12 hr tablet TAKE 1 TABLET BY MOUTH TWICE A DAY 180 tablet 3   sacubitril-valsartan (ENTRESTO) 97-103 MG Take 1 tablet by mouth 2 (two) times daily. (Patient taking differently: Take 1 tablet by mouth 2 (two) times daily. Pt taking 1/2 pill twice daily - says he has been taking this dose for 6 months) 60 tablet 6   Semaglutide-Weight Management (WEGOVY) 2.4 MG/0.75ML SOAJ Inject 2.4 mg into the skin once a week.     sildenafil (VIAGRA) 100 MG tablet TAKE 1 TABLET BY MOUTH EVERY DAY AS NEEDED FOR ERECTILE DYSFUNCTION 30  tablet 1   testosterone cypionate (DEPOTESTOSTERONE CYPIONATE) 200 MG/ML injection Inject 1.5 mLs (300 mg total) into the muscle every 14 (fourteen) days. 10 mL 1   torsemide (DEMADEX) 20 MG tablet Take 1 tablet (20 mg total) by mouth 2 (two) times daily.     albuterol (VENTOLIN HFA) 108 (90 Base) MCG/ACT inhaler TAKE 2 PUFFS BY MOUTH EVERY 6 HOURS AS NEEDED FOR WHEEZE OR SHORTNESS OF BREATH (Patient  not taking: Reported on 09/29/2022) 8.5 each 2   eplerenone (INSPRA) 25 MG tablet Take 1 tablet (25 mg total) by mouth daily. (Patient not taking: Reported on 10/01/2022) 30 tablet 11   methocarbamol (ROBAXIN) 500 MG tablet Take 500 mg by mouth 2 (two) times daily as needed for muscle spasms.  (Patient not taking: Reported on 09/29/2022)     No current facility-administered medications for this encounter.    Vitals:   10/01/22 0939  BP: (!) 124/90  Pulse: (!) 145  SpO2: 94%  Weight: (!) 146.1 kg (322 lb)  Height: 6\' 5"  (1.956 m)    Wt Readings from Last 3 Encounters:  10/01/22 (!) 146.1 kg (322 lb)  09/29/22 (!) 150.1 kg (331 lb)  05/16/22 (!) 142.7 kg (314 lb 9.6 oz)    PHYSICAL EXAM: General:  Walked in the clinic. No resp difficulty HEENT: normal Neck: supple. no JVD. Carotids 2+ bilat; no bruits. No lymphadenopathy or thryomegaly appreciated. Cor: PMI nondisplaced. Tachy Irregular rate & rhythm. No rubs, gallops or murmurs. Lungs: clear Abdomen: soft, nontender, nondistended. No hepatosplenomegaly. No bruits or masses. Good bowel sounds. Extremities: no cyanosis, clubbing, rash, edema Neuro: alert & orientedx3, cranial nerves grossly intact. moves all 4 extremities w/o difficulty. Affect pleasant ECG:  Afib RVR 145 bpm   ASSESSMENT & PLAN: 1. Chronic systolic HF - NICM  - Cardiac CT 11/10. EF 64% no CAD - cath in 01/2010 demonstrating normal cors. EF 25-30% -Cardiac CTA 12/21 Calcium score = 0 No obvious CAD (poor images) - Most recent ECHO 2023 EF 35-40% RV normal.  - Cardiac  CTA 12/21 Calcium score = 0 No obvious CAD (poor images) - NYHA II. Appears euvolemic after Furoscix. Continue current dose of torsemide.  - Continue carvedilol 12.5 mg twice a day, lower dose.  - Continue entresto 49-51 mg twice a day, lower dose. Hold entresto if SBP < 100.  - He has not not been taking eplerenone not sure why. Will restart soon.  - Given results of mediastinal biopsy with ? 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 Has not had arrhythmias.  Check labs BMET/BNP  2. Chest pain - Cardiac CTA 12/21 Calcium score = 0 No obvious CAD (poor images) - No chest pain.    3. PAF - s/p DC-CV 06/19/17 - has been seen in AF Clinic. Felt that he needs to lose weight prior to considering AF ablation. He is working on weight loss.  - In afib RVR earlier this week. Todays EKG shows A fib RVR 145.  - Cardioversion set up for 10/02/22. Discussed procedure. NPO after MN.   -Continue Eliquis. Discussed importance of twice daily dosing. No bleeding currently  - Intolerant amiodarone---> nausea/vomiting. Remains on Ranexa + coreg.   -Check CBC.  -4. HTN Elevated but didn't have meds prior to visit.    5. Morbid Obesity  - Body mass index is 38.18 kg/m. -Previously on GLP1 but insurance has not been covering.    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 4.3 cm previously - CT chest 11/19 Ao Root 4.6cm. Remainder of aorta ok  - Repeat CT 12/21: Ascending thoracic aorta measures 3.8 cm in diameter, mid aortic arch measures 3.0 cm in diameter, and descending thoracic aorta measures 2.3 cm in diameter. - CT 7/22 "No significant vascular findings"   7. Severe OSA (64/hr)  - Followed by Dr. Mayford Knife. Good control.  -Continue CPAP.    8. 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 Had CT set up this month.   Follow up tomorrow for cardioversion.  .   Informed Consent   Shared  Decision Making/Informed Consent The risks (stroke, cardiac arrhythmias rarely resulting in the need for a temporary or permanent pacemaker, skin irritation or burns and complications associated with conscious sedation including aspiration, arrhythmia, respiratory failure and death), benefits (restoration of normal sinus rhythm) and alternatives of a direct current cardioversion were explained in detail to Mr. Canela and he agrees to proceed.    Follow up in 2-3 weeks. Check BMET. Consider restarting eplerenone.          NP-C  9:56 AM

## 2022-09-30 NOTE — Progress Notes (Signed)
PCP: Dr Caryl Never Primary HF Cardiologist: Dr Gala Romney  Pulmonary: Dr Delton Coombes.   HPI: Harold Park is a 57 y.o. male with NICM with EF 30-35%, chronic systolic CHF, parox A-Flutter with RVR (01/2010) and HTN. He had a cath in 01/2010 demonstrating normal cors, EF 25-30% and moderate pulmonary HTN. Echo done in 01/2010 demonstrated EF 30-35%, mild MR, mild LAE, mild RVE.   Found to be in AF 4/17. Started on eliquis. TEE/DCCV scheduled. Was in NSR on his arrival.   We saw him for the first time in May 2017. At that time we switched losartan to [facility] and referred to Reuel Derby and had + sleep study with severe OSA 64/hr. Also saw Dr. Graciela Husbands in EP. Holter monitor placed to quantify PVCs---> - 6.3%. Unable to tolerate amio in past due to severe nausea. Remains on Ranexa 500 bid to try to suppress PVCs.    Underwent DC-CV on 06/19/17 for recurrent AF Seen in AF Clinic on 07/02/17. Felt not to be ideal candidate for AF ablation   Echo 12/21 EF 40-45%   CT angio in 2019 aortic root was 4.6 cm   CT chest 03/06/20 to evaluate hilar fullness seen on CXR. Interval development of numerous enlarged mediastinal and bilateral hilar lymph nodes   CT coronary 03/06/20. Calcium score 0. Poor arterial imaging but no obvious CAD   Underwent bronchoscopy with biopsy of mediastinal nodes. 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 Treated with prednisone.   Saw Dr Gala Romney 05/2021. Stable at that time.. Torsemide was cut back to 20 mg daily.   He was seen in th HF 09/29/22 and was back in A fib and volume overloaded. Started on Furoscix and torsemide was increased to 20 mg twice a day.  Today he returns for HF follow up.Overall feeling fine. Denies SOB/PND/Orthopnea. Appetite ok. No fever or chills. Weight at home  pounds. Taking all medications    ECHO  2023 EF 35-40% RV normal  2020 EF 35-40%. RV ok. No TR. AoRoot 4.1 cm 2019 EF 35-40%. RV ok. 2018 EF 35% Grade IDD RV normal. 2017 EF 35-40%   2016 LVEF 30-35%,  trivial AI, Mod LAE  PET 2023  1. Abnormal apical myocardial perfusion.   2. Abnormal left ventricular systolic function, wall motion, apical thickening, and dilated LV.   Non-diagnostic CT obtained for PET attenuation correction:   1. There are no discernible coronary artery calcifications.   2. There are areas of extracardiac hypermetabolic activity noted on the limited field of view.This includes mediastinum and hilar lymphnodes. This could represent extra cardiac sarcoidosis or other process.If clinically concern a diagnostic whole  body PET/CT may be obtained for further evaluation.   ROS: All systems negative except as listed in HPI, PMH and Problem List.  SH:  Social History   Socioeconomic History   Marital status: Married    Spouse name: Not on file   Number of children: 3   Years of education: Not on file   Highest education level: Not on file  Occupational History   Occupation: realtor  Tobacco Use   Smoking status: Never   Smokeless tobacco: Never  Vaping Use   Vaping status: Never Used  Substance and Sexual Activity   Alcohol use: Yes    Comment: occasional   Drug use: Never   Sexual activity: Not on file  Other Topics Concern   Not on file  Social History Narrative   Not on file   Social Determinants of Health   Financial  Resource Strain: Not on file  Food Insecurity: Not on file  Transportation Needs: Not on file  Physical Activity: Not on file  Stress: Not on file  Social Connections: Not on file  Intimate Partner Violence: Not on file    FH:  Family History  Problem Relation Age of Onset   Breast cancer Mother    Cancer Father        blood cancer   Hypertension Other    Heart disease Other    Stomach cancer Neg Hx    Colon cancer Neg Hx    Esophageal cancer Neg Hx    Pancreatic cancer Neg Hx    Rectal cancer Neg Hx     Past Medical History:  Diagnosis Date   Chronic bronchitis    HTN (hypertension)    NICM (nonischemic  cardiomyopathy) (HCC)    Obesity (BMI 30-39.9) 12/04/2015   OSA (obstructive sleep apnea) 08/19/2015   Severe with AHI 64/hr now on CPAP at 13cm H2O   PAF (paroxysmal atrial fibrillation) (HCC)    Systolic CHF, chronic (HCC)     Current Outpatient Medications  Medication Sig Dispense Refill   albuterol (VENTOLIN HFA) 108 (90 Base) MCG/ACT inhaler TAKE 2 PUFFS BY MOUTH EVERY 6 HOURS AS NEEDED FOR WHEEZE OR SHORTNESS OF BREATH (Patient not taking: Reported on 09/29/2022) 8.5 each 2   apixaban (ELIQUIS) 5 MG TABS tablet Take 1 tablet (5 mg total) by mouth 2 (two) times daily. 60 tablet 6   carvedilol (COREG) 25 MG tablet Take 1 tablet (25 mg total) by mouth daily in the afternoon. (Patient taking differently: Take 12.5 mg by mouth 2 (two) times daily with a meal.) 90 tablet 1   DULoxetine (CYMBALTA) 20 MG capsule TAKE 1 CAPSULE BY MOUTH EVERY DAY 90 capsule 11   eplerenone (INSPRA) 25 MG tablet Take 1 tablet (25 mg total) by mouth daily. 30 tablet 11   Furosemide (FUROSCIX) 80 MG/10ML CTKT Inject 80 mg into the skin as directed.     methocarbamol (ROBAXIN) 500 MG tablet Take 500 mg by mouth 2 (two) times daily as needed for muscle spasms.  (Patient not taking: Reported on 09/29/2022)     NEEDLE, DISP, 20 G (BD DISP NEEDLES) 20G X 1-1/2" MISC Use as directed 10 each 0   NEEDLE, DISP, 22 G 22G X 1" MISC Use to inject testosterone into the muscle once every 14 days. 6 each 3   potassium chloride SA (KLOR-CON M20) 20 MEQ tablet TAKE 4 TABLETS BY MOUTH EVERY MORNING AND 2 TABLETS DAILY W/ LUNCH AND 4 TABS EVERY EVENING. 900 tablet 1   ranolazine (RANEXA) 500 MG 12 hr tablet TAKE 1 TABLET BY MOUTH TWICE A DAY 180 tablet 3   sacubitril-valsartan (ENTRESTO) 97-103 MG Take 1 tablet by mouth 2 (two) times daily. 60 tablet 6   sildenafil (VIAGRA) 100 MG tablet TAKE 1 TABLET BY MOUTH EVERY DAY AS NEEDED FOR ERECTILE DYSFUNCTION 30 tablet 1   testosterone cypionate (DEPOTESTOSTERONE CYPIONATE) 200 MG/ML  injection Inject 1.5 mLs (300 mg total) into the muscle every 14 (fourteen) days. 10 mL 1   torsemide (DEMADEX) 20 MG tablet Take 1 tablet (20 mg total) by mouth 2 (two) times daily.     No current facility-administered medications for this visit.    There were no vitals filed for this visit.  Wt Readings from Last 3 Encounters:  09/29/22 (!) 150.1 kg (331 lb)  05/16/22 (!) 142.7 kg (314 lb 9.6 oz)  01/15/22 (!) 144.4 kg (318 lb 6.4 oz)    PHYSICAL EXAM: General:  Well appearing. No resp difficulty HEENT: normal Neck: supple. no JVD. Carotids 2+ bilat; no bruits. No lymphadenopathy or thryomegaly appreciated. Cor: PMI nondisplaced. Regular rate & rhythm. No rubs, gallops or murmurs. Lungs: clear Abdomen: soft, nontender, nondistended. No hepatosplenomegaly. No bruits or masses. Good bowel sounds. Extremities: no cyanosis, clubbing, rash, edema Neuro: alert & orientedx3, cranial nerves grossly intact. moves all 4 extremities w/o difficulty. Affect pleasant ECG:   ASSESSMENT & PLAN: 1. Acute on Chronic systolic HF - NICM  - Cardiac CT 11/10. EF 64% no CAD - cath in 01/2010 demonstrating normal cors. EF 25-30% -Cardiac CTA 12/21 Calcium score = 0 No obvious CAD (poor images) - Most recent ECHO 2023 EF 35-40% RV normal.  - Cardiac CTA 12/21 Calcium score = 0 No obvious CAD (poor images) - NYHA  Volume status    - Continue carvedilol 25 mg twice a day.  - Continue entresto 97-103 mg twice a day. Hold entresto if SBP < 100.  - Continue eplerenone - Given results of mediastinal biopsy with ? 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 Has not had arrhythmias.  Check labs t  2. Chest pain - Cardiac CTA 12/21 Calcium score = 0 No obvious CAD (poor images)    3. PAF - s/p DC-CV 06/19/17 - has been seen in AF Clinic. Felt that he needs to lose weight prior to considering AF ablation. He is working on weight loss.  In afib RVR earlier this week. TOday EKG shows ***    -Continue Eliquis. Discussed importance of twice daily dosing. No bleeding currently  - Intolerant amiodarone---> nausea/vomiting. Remains on Ranexa + coreg.   - Attempt to diurese . See back on Wednesday if remain in A fib RVR will need cardioversion on Thursday.    4. HTN   5. Morbid Obesity  - There is no height or weight on file to calculate BMI. -Previously on GLP1 but insurance has not been covering.    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 4.3 cm previously - CT chest 11/19 Ao Root 4.6cm. Remainder of aorta ok  - Repeat CT 12/21: Ascending thoracic aorta measures 3.8 cm in diameter, mid aortic arch measures 3.0 cm in diameter, and descending thoracic aorta measures 2.3 cm in diameter. - CT 7/22 "No significant vascular findings" - Repeat echo   7. Severe OSA (64/hr)  - Followed by Dr. Mayford Knife. Good control.  - Continue CPAP.    8. 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 Had CT set up this month.       NP-C  7:30 AM

## 2022-10-01 ENCOUNTER — Telehealth (HOSPITAL_COMMUNITY): Payer: Self-pay | Admitting: Cardiology

## 2022-10-01 ENCOUNTER — Encounter (HOSPITAL_COMMUNITY): Payer: Self-pay

## 2022-10-01 ENCOUNTER — Ambulatory Visit (HOSPITAL_COMMUNITY)
Admission: RE | Admit: 2022-10-01 | Discharge: 2022-10-01 | Disposition: A | Discharge status: Home / Self Care | Payer: BC Managed Care – PPO | Source: Ambulatory Visit | Attending: Adult Health | Admitting: Adult Health

## 2022-10-01 VITALS — BP 124/90 | HR 145 | Ht 77.0 in | Wt 322.0 lb

## 2022-10-01 DIAGNOSIS — I480 Paroxysmal atrial fibrillation: Secondary | ICD-10-CM

## 2022-10-01 DIAGNOSIS — Z79899 Other long term (current) drug therapy: Secondary | ICD-10-CM | POA: Diagnosis not present

## 2022-10-01 DIAGNOSIS — I5022 Chronic systolic (congestive) heart failure: Secondary | ICD-10-CM | POA: Diagnosis not present

## 2022-10-01 DIAGNOSIS — D860 Sarcoidosis of lung: Secondary | ICD-10-CM | POA: Diagnosis not present

## 2022-10-01 DIAGNOSIS — I10 Essential (primary) hypertension: Secondary | ICD-10-CM

## 2022-10-01 DIAGNOSIS — I110 Hypertensive heart disease with heart failure: Secondary | ICD-10-CM | POA: Insufficient documentation

## 2022-10-01 DIAGNOSIS — R079 Chest pain, unspecified: Secondary | ICD-10-CM | POA: Diagnosis not present

## 2022-10-01 DIAGNOSIS — R000 Tachycardia, unspecified: Secondary | ICD-10-CM

## 2022-10-01 DIAGNOSIS — G4733 Obstructive sleep apnea (adult) (pediatric): Secondary | ICD-10-CM

## 2022-10-01 DIAGNOSIS — R112 Nausea with vomiting, unspecified: Secondary | ICD-10-CM | POA: Diagnosis not present

## 2022-10-01 DIAGNOSIS — Z7901 Long term (current) use of anticoagulants: Secondary | ICD-10-CM | POA: Diagnosis not present

## 2022-10-01 DIAGNOSIS — Z6838 Body mass index (BMI) 38.0-38.9, adult: Secondary | ICD-10-CM | POA: Insufficient documentation

## 2022-10-01 DIAGNOSIS — I428 Other cardiomyopathies: Secondary | ICD-10-CM | POA: Insufficient documentation

## 2022-10-01 LAB — CBC
HCT: 54 % — ABNORMAL HIGH (ref 39.0–52.0)
Hemoglobin: 18.4 g/dL — ABNORMAL HIGH (ref 13.0–17.0)
MCH: 28.8 pg (ref 26.0–34.0)
MCHC: 34.1 g/dL (ref 30.0–36.0)
MCV: 84.4 fL (ref 80.0–100.0)
Platelets: 209 10*3/uL (ref 150–400)
RBC: 6.4 MIL/uL — ABNORMAL HIGH (ref 4.22–5.81)
RDW: 16.6 % — ABNORMAL HIGH (ref 11.5–15.5)
WBC: 11.8 10*3/uL — ABNORMAL HIGH (ref 4.0–10.5)
nRBC: 0 % (ref 0.0–0.2)

## 2022-10-01 LAB — BASIC METABOLIC PANEL
Anion gap: 13 (ref 5–15)
BUN: 34 mg/dL — ABNORMAL HIGH (ref 6–20)
CO2: 24 mmol/L (ref 22–32)
Calcium: 9.2 mg/dL (ref 8.9–10.3)
Chloride: 99 mmol/L (ref 98–111)
Creatinine, Ser: 1.82 mg/dL — ABNORMAL HIGH (ref 0.61–1.24)
GFR, Estimated: 43 mL/min — ABNORMAL LOW (ref >60)
Glucose, Bld: 122 mg/dL — ABNORMAL HIGH (ref 70–99)
Potassium: 3.2 mmol/L — ABNORMAL LOW (ref 3.5–5.1)
Sodium: 136 mmol/L (ref 135–145)

## 2022-10-01 LAB — BRAIN NATRIURETIC PEPTIDE: B Natriuretic Peptide: 822.7 pg/mL — ABNORMAL HIGH (ref 0.0–100.0)

## 2022-10-01 MED ORDER — TORSEMIDE 20 MG PO TABS
10.0000 mg | ORAL_TABLET | Freq: Two times a day (BID) | ORAL | 3 refills | Status: DC
Start: 2022-10-01 — End: 2022-11-08

## 2022-10-01 MED ORDER — TORSEMIDE 20 MG PO TABS
20.0000 mg | ORAL_TABLET | Freq: Two times a day (BID) | ORAL | 3 refills | Status: DC
Start: 2022-10-01 — End: 2022-10-01

## 2022-10-01 NOTE — Telephone Encounter (Signed)
Patient called.  Patient aware.  

## 2022-10-01 NOTE — Pre-Procedure Instructions (Signed)
Spoke to patient on phone regarding procedure tomorrow.  Instructed to arrive at 12 noon, NPO after midnight.  Confirmed patient has a ride home and responsible person to stay with patient for 24 hours after the procedure  Confirmed no missed doses of Eliquis , instructed patient to take the morning of surgery with a sip of water.  Verified patient stopped Wegovy (patient stopped 10 days ago)  Patient to also take Carvedilol, Duloxetine, Ranexa, and Entresto in the AM with a sip of water

## 2022-10-01 NOTE — Telephone Encounter (Signed)
-----   Message from Tonye Becket sent at 10/01/2022  1:37 PM EDT ----- Please call. Hold afternoon dose of torsemide tonight. Tomorrow cut back torsemide to 10 mg twice a day  K a little low. Continue potassium as ordered. Please call.    Hgb elevated. Needs to see hematology again.

## 2022-10-01 NOTE — Patient Instructions (Addendum)
Labs today - will call you if abnormal. Please take your Eliquis twice daily. Continue Torsemide 20 mg twice daily - Rx sent to your local pharmacy. Cardioversion tomorrow - remember nothing to eat or drink after midnight. Return to Heart Failure APP Clinic in 2 - 3 weeks - see below. Please call us at [phone] if any questions or concerns prior to your next visit.

## 2022-10-02 ENCOUNTER — Encounter (HOSPITAL_COMMUNITY)
Admission: RE | Disposition: A | Discharge status: Home / Self Care | Payer: Self-pay | Source: Home / Self Care | Attending: Internal Medicine

## 2022-10-02 ENCOUNTER — Ambulatory Visit (HOSPITAL_COMMUNITY): Payer: BC Managed Care – PPO | Admitting: Anesthesiology

## 2022-10-02 ENCOUNTER — Other Ambulatory Visit: Payer: Self-pay

## 2022-10-02 ENCOUNTER — Encounter (HOSPITAL_COMMUNITY): Payer: Self-pay | Admitting: Internal Medicine

## 2022-10-02 ENCOUNTER — Ambulatory Visit (HOSPITAL_COMMUNITY)
Admission: RE | Admit: 2022-10-02 | Discharge: 2022-10-02 | Disposition: A | Discharge status: Home / Self Care | Payer: BC Managed Care – PPO | Attending: Internal Medicine | Admitting: Internal Medicine

## 2022-10-02 DIAGNOSIS — Z6838 Body mass index (BMI) 38.0-38.9, adult: Secondary | ICD-10-CM | POA: Diagnosis not present

## 2022-10-02 DIAGNOSIS — I110 Hypertensive heart disease with heart failure: Secondary | ICD-10-CM | POA: Insufficient documentation

## 2022-10-02 DIAGNOSIS — D860 Sarcoidosis of lung: Secondary | ICD-10-CM | POA: Diagnosis not present

## 2022-10-02 DIAGNOSIS — I480 Paroxysmal atrial fibrillation: Secondary | ICD-10-CM | POA: Diagnosis not present

## 2022-10-02 DIAGNOSIS — Z7901 Long term (current) use of anticoagulants: Secondary | ICD-10-CM | POA: Diagnosis not present

## 2022-10-02 DIAGNOSIS — G4733 Obstructive sleep apnea (adult) (pediatric): Secondary | ICD-10-CM | POA: Insufficient documentation

## 2022-10-02 DIAGNOSIS — I4891 Unspecified atrial fibrillation: Secondary | ICD-10-CM | POA: Diagnosis not present

## 2022-10-02 DIAGNOSIS — Z79899 Other long term (current) drug therapy: Secondary | ICD-10-CM | POA: Insufficient documentation

## 2022-10-02 DIAGNOSIS — I5022 Chronic systolic (congestive) heart failure: Secondary | ICD-10-CM | POA: Diagnosis not present

## 2022-10-02 DIAGNOSIS — I428 Other cardiomyopathies: Secondary | ICD-10-CM | POA: Insufficient documentation

## 2022-10-02 HISTORY — PX: CARDIOVERSION: SHX1299

## 2022-10-02 LAB — POCT I-STAT, CHEM 8
BUN: 39 mg/dL — ABNORMAL HIGH (ref 6–20)
Calcium, Ion: 1.18 mmol/L (ref 1.15–1.40)
Chloride: 99 mmol/L (ref 98–111)
Creatinine, Ser: 1.9 mg/dL — ABNORMAL HIGH (ref 0.61–1.24)
Glucose, Bld: 103 mg/dL — ABNORMAL HIGH (ref 70–99)
HCT: 55 % — ABNORMAL HIGH (ref 39.0–52.0)
Hemoglobin: 18.7 g/dL — ABNORMAL HIGH (ref 13.0–17.0)
Potassium: 4.1 mmol/L (ref 3.5–5.1)
Sodium: 138 mmol/L (ref 135–145)
TCO2: 27 mmol/L (ref 22–32)

## 2022-10-02 SURGERY — CARDIOVERSION
Anesthesia: General

## 2022-10-02 MED ORDER — PHENYLEPHRINE HCL (PRESSORS) 10 MG/ML IV SOLN
INTRAVENOUS | Status: DC | PRN
  Administered 2022-10-02: 160 ug via INTRAVENOUS
  Administered 2022-10-02: 320 ug via INTRAVENOUS
  Administered 2022-10-02: 160 ug via INTRAVENOUS

## 2022-10-02 MED ORDER — SODIUM CHLORIDE 0.9 % IV SOLN: INTRAVENOUS | Status: DC

## 2022-10-02 MED ORDER — PROPOFOL 10 MG/ML IV BOLUS
INTRAVENOUS | Status: DC | PRN
  Administered 2022-10-02: 40 mg via INTRAVENOUS
  Administered 2022-10-02: 100 mg via INTRAVENOUS

## 2022-10-02 MED ORDER — EPHEDRINE SULFATE (PRESSORS) 50 MG/ML IJ SOLN
INTRAMUSCULAR | Status: DC | PRN
  Administered 2022-10-02: 5 mg via INTRAVENOUS

## 2022-10-02 MED ORDER — LIDOCAINE HCL (CARDIAC) PF 100 MG/5ML IV SOSY
PREFILLED_SYRINGE | INTRAVENOUS | Status: DC | PRN
  Administered 2022-10-02: 60 mg via INTRAVENOUS

## 2022-10-02 SURGICAL SUPPLY — 1 items: ELECT DEFIB PAD ADLT CADENCE (PAD) ×1 IMPLANT

## 2022-10-02 NOTE — CV Procedure (Signed)
    DIRECT CURRENT CARDIOVERSION  NAME:  Harold Park   MRN: [id number] DOB:  [birth year]   ADMIT DATE: 10/02/2022   INDICATIONS: Atrial fibrillation    PROCEDURE:   Informed consent was obtained prior to the procedure. The risks, benefits and alternatives for the procedure were discussed and the patient comprehended these risks. Once an appropriate time out was taken, the patient had the defibrillator pads placed in the anterior and posterior position. The patient then underwent sedation by the anesthesia service. Once an appropriate level of sedation was achieved, the patient received a single biphasic, synchronized 200J shock with prompt conversion to sinus rhythm. No apparent complications.  Arvilla Meres, MD  1:37 PM

## 2022-10-02 NOTE — Transfer of Care (Signed)
Immediate Anesthesia Transfer of Care Note  Patient: Harold Park  Procedure(s) Performed: CARDIOVERSION  Patient Location: Cath Lab  Anesthesia Type:General  Level of Consciousness: awake, alert , and oriented  Airway & Oxygen Therapy: Patient Spontanous Breathing and Patient connected to nasal cannula oxygen  Post-op Assessment: Report given to RN and Post -op Vital signs reviewed and stable  Post vital signs: Reviewed and stable  Last Vitals:  Vitals Value Taken Time  BP 100/62 10/02/22 1346  Temp    Pulse 96 10/02/22 1347  Resp 20 10/02/22 1347  SpO2 96 % 10/02/22 1347    Last Pain:  Vitals:   10/02/22 1202  TempSrc: Temporal         Complications: No notable events documented.

## 2022-10-02 NOTE — Anesthesia Preprocedure Evaluation (Signed)
Anesthesia Evaluation  Patient identified by MRN, date of birth, ID band Patient awake    Reviewed: Allergy & Precautions, H&P , NPO status , Patient's Chart, lab work & pertinent test results  Airway Mallampati: II   Neck ROM: full    Dental   Pulmonary sleep apnea    breath sounds clear to auscultation       Cardiovascular hypertension, + dysrhythmias Atrial Fibrillation  Rhythm:irregular Rate:Normal     Neuro/Psych  PSYCHIATRIC DISORDERS  Depression       GI/Hepatic   Endo/Other    Morbid obesity  Renal/GU Renal InsufficiencyRenal disease     Musculoskeletal   Abdominal   Peds  Hematology   Anesthesia Other Findings   Reproductive/Obstetrics                             Anesthesia Physical Anesthesia Plan  ASA: 3  Anesthesia Plan: General   Post-op Pain Management:    Induction: Intravenous  PONV Risk Score and Plan: 2 and Propofol infusion and Treatment may vary due to age or medical condition  Airway Management Planned: Nasal Cannula  Additional Equipment:   Intra-op Plan:   Post-operative Plan:   Informed Consent: I have reviewed the patients History and Physical, chart, labs and discussed the procedure including the risks, benefits and alternatives for the proposed anesthesia with the patient or authorized representative who has indicated his/her understanding and acceptance.     Dental advisory given  Plan Discussed with: CRNA, Anesthesiologist and Surgeon  Anesthesia Plan Comments:        Anesthesia Quick Evaluation

## 2022-10-02 NOTE — Interval H&P Note (Signed)
History and Physical Interval Note:  10/02/2022 12:44 PM  Harold Park  has presented today for surgery, with the diagnosis of AFIB.  The various methods of treatment have been discussed with the patient and family. After consideration of risks, benefits and other options for treatment, the patient has consented to  Procedure(s): CARDIOVERSION (N/A) as a surgical intervention.  The patient's history has been reviewed, patient examined, no change in status, stable for surgery.  I have reviewed the patient's chart and labs.  Questions were answered to the patient's satisfaction.      

## 2022-10-03 ENCOUNTER — Encounter (HOSPITAL_COMMUNITY): Payer: Self-pay | Admitting: Internal Medicine

## 2022-10-03 NOTE — Anesthesia Postprocedure Evaluation (Signed)
Anesthesia Post Note  Patient: Harold Park  Procedure(s) Performed: CARDIOVERSION     Patient location during evaluation: Cath Lab Anesthesia Type: General Level of consciousness: awake and alert Pain management: pain level controlled Vital Signs Assessment: post-procedure vital signs reviewed and stable Respiratory status: spontaneous breathing, nonlabored ventilation, respiratory function stable and patient connected to nasal cannula oxygen Cardiovascular status: blood pressure returned to baseline and stable Postop Assessment: no apparent nausea or vomiting Anesthetic complications: no   No notable events documented.  Last Vitals:  Vitals:   10/02/22 1418 10/02/22 1419  BP: 96/63   Pulse: 97 93  Resp: (!) 24 (!) 27  Temp:  36.6 C  SpO2: 92% 92%    Last Pain:  Vitals:   10/02/22 1419  TempSrc: Temporal  PainSc: 0-No pain                 , S

## 2022-10-09 ENCOUNTER — Telehealth (HOSPITAL_COMMUNITY): Payer: Self-pay

## 2022-10-09 DIAGNOSIS — I480 Paroxysmal atrial fibrillation: Secondary | ICD-10-CM

## 2022-10-09 NOTE — Telephone Encounter (Addendum)
Called patient willing to see A. Fib scheduled for 2 pm on 08/02  He just underwent cardioversion 7/25. Records indicate that he was previously taken off of amiodarone due to intolerance, so this is not an option. Can we get him worked into the [employer] tomorrow??   Patient called to say that he is back in A. Fib with HR in the 120's -130's.  At the minute he is asymptotic. Advise patient if he becomes symptotic to go get evaluated at the ED

## 2022-10-10 ENCOUNTER — Telehealth (HOSPITAL_COMMUNITY): Payer: Self-pay

## 2022-10-10 ENCOUNTER — Ambulatory Visit (HOSPITAL_COMMUNITY)
Admission: RE | Admit: 2022-10-10 | Discharge: 2022-10-10 | Disposition: A | Discharge status: Home / Self Care | Payer: BC Managed Care – PPO | Source: Ambulatory Visit | Attending: Internal Medicine | Admitting: Internal Medicine

## 2022-10-10 ENCOUNTER — Encounter (HOSPITAL_COMMUNITY): Payer: Self-pay | Admitting: Internal Medicine

## 2022-10-10 ENCOUNTER — Other Ambulatory Visit (HOSPITAL_COMMUNITY): Payer: Self-pay | Admitting: Internal Medicine

## 2022-10-10 VITALS — BP 114/62 | HR 135 | Ht 77.0 in | Wt 313.2 lb

## 2022-10-10 DIAGNOSIS — I480 Paroxysmal atrial fibrillation: Secondary | ICD-10-CM | POA: Insufficient documentation

## 2022-10-10 DIAGNOSIS — Z79899 Other long term (current) drug therapy: Secondary | ICD-10-CM | POA: Diagnosis not present

## 2022-10-10 DIAGNOSIS — G4733 Obstructive sleep apnea (adult) (pediatric): Secondary | ICD-10-CM | POA: Insufficient documentation

## 2022-10-10 DIAGNOSIS — I4892 Unspecified atrial flutter: Secondary | ICD-10-CM | POA: Insufficient documentation

## 2022-10-10 DIAGNOSIS — R9431 Abnormal electrocardiogram [ECG] [EKG]: Secondary | ICD-10-CM | POA: Insufficient documentation

## 2022-10-10 DIAGNOSIS — I5022 Chronic systolic (congestive) heart failure: Secondary | ICD-10-CM | POA: Insufficient documentation

## 2022-10-10 DIAGNOSIS — D6869 Other thrombophilia: Secondary | ICD-10-CM | POA: Diagnosis not present

## 2022-10-10 DIAGNOSIS — Z7901 Long term (current) use of anticoagulants: Secondary | ICD-10-CM | POA: Diagnosis not present

## 2022-10-10 NOTE — Patient Instructions (Addendum)
Please temporarily increase carvedilol to 37.5 mg daily (1 and a half tablets) to lower your heart rate. Drink increased fluids as needed to maintain BP > 100 systolic.   Please take potassium 20 meq 1 tablet daily.    Please stop Ranexa.

## 2022-10-10 NOTE — Addendum Note (Signed)
Encounter addended by: Learta Codding, CMA on: 10/10/2022 5:00 PM  Actions taken: Visit diagnoses modified, Order list changed, Diagnosis association updated

## 2022-10-10 NOTE — Progress Notes (Addendum)
Primary Care Physician: Kristian Covey, MD Primary Cardiologist: Armanda Magic, MD Electrophysiologist: None     Referring Physician: Dr. Rolly Salter Harold Park is a 57 y.o. male with a history of NICM with EF 30-35%, OSA on CPAP, chronic systolic CHF, paroxysmal atrial flutter with RVR (01/2010), HTN, pulmonary hypertension, and atrial fibrillation who presents for consultation in the [facility]. He was seen by HF on 7/24 and noted to be in atrial flutter with RVR. He underwent successful DCCV on 10/02/22 but he contacted office 8/1 due to having ERAF. 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 Patient is on Eliquis 5 mg BID for a CHADS2VASC score of 2.  On evaluation today, he is currently in atrial flutter with RVR. He feels a little tired and short winded when walking. He has not missed any doses of Eliquis. He states previously amiodarone would make him vomit within 20 minutes of taking medication.  Today, he denies symptoms of palpitations, chest pain, shortness of breath, orthopnea, PND, lower extremity edema, dizziness, presyncope, syncope, snoring, daytime somnolence, bleeding, or neurologic sequela. The patient is tolerating medications without difficulties and is otherwise without complaint today.    Atrial Fibrillation Risk Factors:  he does have symptoms or diagnosis of sleep apnea. he is compliant with CPAP therapy.  he has a BMI of Body mass index is 37.14 kg/m.Marland Kitchen Filed Weights   10/10/22 1407  Weight: (!) 142.1 kg    Current Outpatient Medications  Medication Sig Dispense Refill   albuterol (VENTOLIN HFA) 108 (90 Base) MCG/ACT inhaler TAKE 2 PUFFS BY MOUTH EVERY 6 HOURS AS NEEDED FOR WHEEZE OR SHORTNESS OF BREATH 8.5 each 2   carvedilol (COREG) 25 MG tablet Take 1 tablet (25 mg total) by mouth daily in the afternoon. (Patient taking differently: Take 12.5 mg by mouth 2 (two) times daily with a meal.) 90  tablet 1   DULoxetine (CYMBALTA) 20 MG capsule TAKE 1 CAPSULE BY MOUTH EVERY DAY 90 capsule 11   ELIQUIS 5 MG TABS tablet TAKE 1 TABLET BY MOUTH TWICE A DAY 60 tablet 6   methocarbamol (ROBAXIN) 500 MG tablet Take 500 mg by mouth 2 (two) times daily as needed for muscle spasms.     NEEDLE, DISP, 20 G (BD DISP NEEDLES) 20G X 1-1/2" MISC Use as directed 10 each 0   NEEDLE, DISP, 22 G 22G X 1" MISC Use to inject testosterone into the muscle once every 14 days. 6 each 3   NON FORMULARY once a week. Zepbound  Compound     potassium chloride SA (KLOR-CON M20) 20 MEQ tablet TAKE 4 TABLETS BY MOUTH EVERY MORNING AND 2 TABLETS DAILY W/ LUNCH AND 4 TABS EVERY EVENING. 900 tablet 1   ranolazine (RANEXA) 500 MG 12 hr tablet TAKE 1 TABLET BY MOUTH TWICE A DAY 180 tablet 3   sacubitril-valsartan (ENTRESTO) 97-103 MG Take 1/2 tablet twice daily. 60 tablet 6   sildenafil (VIAGRA) 100 MG tablet TAKE 1 TABLET BY MOUTH EVERY DAY AS NEEDED FOR ERECTILE DYSFUNCTION 30 tablet 1   testosterone cypionate (DEPOTESTOSTERONE CYPIONATE) 200 MG/ML injection Inject 1.5 mLs (300 mg total) into the muscle every 14 (fourteen) days. 10 mL 1   torsemide (DEMADEX) 20 MG tablet Take 0.5 tablets (10 mg total) by mouth 2 (two) times daily. 90 tablet 3   Semaglutide-Weight Management (WEGOVY) 2.4 MG/0.75ML SOAJ Inject 2.4 mg into the skin once a week.  No current facility-administered medications for this encounter.    Atrial Fibrillation Management history:  Previous antiarrhythmic drugs: amiodarone (discontinued due to N/V) Previous cardioversions: 06/19/2017, 10/02/22 Previous ablations: None Anticoagulation history: Eliquis 5 mg BID  ROS- All systems are reviewed and negative except as per the HPI above.  Physical Exam: BP 114/62   Pulse (!) 135   Ht 6\' 5"  (1.956 m)   Wt (!) 142.1 kg   BMI 37.14 kg/m   GEN: Well nourished, well developed in no acute distress NECK: No JVD; No carotid bruits CARDIAC: Tachycardic  irregularly irregular rate and rhythm, no murmurs, rubs, gallops RESPIRATORY:  Clear to auscultation without rales, wheezing or rhonchi  ABDOMEN: Soft, non-tender, non-distended EXTREMITIES:  No edema; No deformity   EKG today demonstrates  Vent. rate 135 BPM PR interval * ms QRS duration 124 ms QT/QTcB 336/504 ms P-R-T axes * 176 33 Atrial flutter with variable A-V block Possible Lateral infarct , age undetermined Abnormal ECG When compared with ECG of 02-Oct-2022 13:56, PREVIOUS ECG IS PRESENT  Echo 06/13/21 demonstrated   1. Left ventricular ejection fraction, by estimation, is 35 to 40%. The  left ventricle has moderately decreased function. The left ventricle  demonstrates global hypokinesis. Indeterminate diastolic filling due to  E-A fusion.   2. Right ventricular systolic function is normal. The right ventricular  size is normal. Tricuspid regurgitation signal is inadequate for assessing  PA pressure.   3. The mitral valve is grossly normal. Trivial mitral valve  regurgitation. No evidence of mitral stenosis.   4. The aortic valve is tricuspid. Aortic valve regurgitation is not  visualized. No aortic stenosis is present.   5. The inferior vena cava is normal in size with greater than 50%  respiratory variability, suggesting right atrial pressure of 3 mmHg.   ASSESSMENT & PLAN CHA2DS2-VASc Score = 2  The patient's score is based upon: CHF History: 1 HTN History: 1 Diabetes History: 0 Stroke History: 0 Vascular Disease History: 0 Age Score: 0 Gender Score: 0       ASSESSMENT AND PLAN: Paroxysmal Atrial Fibrillation (ICD10:  I48.0) / Atrial flutter The patient's CHA2DS2-VASc score is 2, indicating a 2.2% annual risk of stroke.    He is currently in atrial flutter with RVR.  We discussed treatment options going forward for his arrhythmia burden. He cannot take flecainide due to HF history. He has previously been intolerant of amiodarone due to nausea/vomiting. He  would be a candidate for Tikosyn and can also discuss ablation with EP.  CrCl estimate 90 mL/min from [facility] on 7/24 with creatinine 1.82 would qualify patient for Tikosyn 500 mcg BID. He would have to discontinue Ranexa due to contraindication with Tikosyn. After discussion with Dr. Gala Romney, he is able to discontinue Ranexa if wishes to pursue Tikosyn load. He is going to stop Ranexa. He will temporarily increase carvedilol to 37.5 mg daily. He will begin Potassium 20 meq daily (has at home; does not need new prescription). He will hold Wegovy in case we can schedule him for Tikosyn next week. I will refer to EP to discuss ablation as well.   Secondary Hypercoagulable State (ICD10:  D68.69) The patient is at significant risk for stroke/thromboembolism based upon his CHA2DS2-VASc Score of 2.  Continue Apixaban (Eliquis).   No missed doses.     We will schedule for Tikosyn admission pending insurance approval.    Lake Bells, PA-C  Af[facility] [address] [place], [place] [address] [phone]

## 2022-10-10 NOTE — Addendum Note (Signed)
Encounter addended by: Learta Codding, CMA on: 10/10/2022 3:36 PM  Actions taken: Order Reconciliation Section accessed

## 2022-10-10 NOTE — Telephone Encounter (Signed)
Patient was seen in A. Fib clinic today. They want to start Tikosyn but patient is reluctant to start it due to side effects etc. Would like to know if you advise this route, re cardiovert or get an ablation. Informed patient that he would not get an answer till next week and if he becomes symptomatic to go to the emergency room to get evaluated

## 2022-10-13 ENCOUNTER — Other Ambulatory Visit (HOSPITAL_COMMUNITY): Payer: Self-pay | Admitting: Internal Medicine

## 2022-10-13 DIAGNOSIS — G4733 Obstructive sleep apnea (adult) (pediatric): Secondary | ICD-10-CM | POA: Diagnosis not present

## 2022-10-14 ENCOUNTER — Emergency Department (HOSPITAL_BASED_OUTPATIENT_CLINIC_OR_DEPARTMENT_OTHER): Payer: BC Managed Care – PPO

## 2022-10-14 ENCOUNTER — Encounter (HOSPITAL_BASED_OUTPATIENT_CLINIC_OR_DEPARTMENT_OTHER): Payer: Self-pay

## 2022-10-14 ENCOUNTER — Telehealth: Payer: Self-pay | Admitting: Pharmacist

## 2022-10-14 ENCOUNTER — Telehealth: Payer: Self-pay | Admitting: Cardiology

## 2022-10-14 ENCOUNTER — Other Ambulatory Visit (HOSPITAL_COMMUNITY): Payer: Self-pay

## 2022-10-14 ENCOUNTER — Other Ambulatory Visit: Payer: Self-pay

## 2022-10-14 ENCOUNTER — Inpatient Hospital Stay (HOSPITAL_BASED_OUTPATIENT_CLINIC_OR_DEPARTMENT_OTHER)
Admission: EM | Admit: 2022-10-14 | Discharge: 2022-10-24 | DRG: 286 | Disposition: A | Discharge status: Home Health | Payer: BC Managed Care – PPO | Attending: Internal Medicine | Admitting: Internal Medicine

## 2022-10-14 DIAGNOSIS — I5023 Acute on chronic systolic (congestive) heart failure: Secondary | ICD-10-CM | POA: Diagnosis present

## 2022-10-14 DIAGNOSIS — I517 Cardiomegaly: Secondary | ICD-10-CM | POA: Diagnosis not present

## 2022-10-14 DIAGNOSIS — I4891 Unspecified atrial fibrillation: Secondary | ICD-10-CM

## 2022-10-14 DIAGNOSIS — N1832 Chronic kidney disease, stage 3b: Secondary | ICD-10-CM | POA: Diagnosis not present

## 2022-10-14 DIAGNOSIS — Z7985 Long-term (current) use of injectable non-insulin antidiabetic drugs: Secondary | ICD-10-CM

## 2022-10-14 DIAGNOSIS — E1165 Type 2 diabetes mellitus with hyperglycemia: Secondary | ICD-10-CM | POA: Diagnosis present

## 2022-10-14 DIAGNOSIS — I959 Hypotension, unspecified: Secondary | ICD-10-CM | POA: Diagnosis present

## 2022-10-14 DIAGNOSIS — Z1152 Encounter for screening for COVID-19: Secondary | ICD-10-CM | POA: Diagnosis not present

## 2022-10-14 DIAGNOSIS — E8770 Fluid overload, unspecified: Secondary | ICD-10-CM | POA: Diagnosis not present

## 2022-10-14 DIAGNOSIS — I484 Atypical atrial flutter: Secondary | ICD-10-CM | POA: Diagnosis present

## 2022-10-14 DIAGNOSIS — I509 Heart failure, unspecified: Secondary | ICD-10-CM

## 2022-10-14 DIAGNOSIS — D6869 Other thrombophilia: Secondary | ICD-10-CM | POA: Diagnosis not present

## 2022-10-14 DIAGNOSIS — I472 Ventricular tachycardia, unspecified: Secondary | ICD-10-CM | POA: Diagnosis present

## 2022-10-14 DIAGNOSIS — Z6837 Body mass index (BMI) 37.0-37.9, adult: Secondary | ICD-10-CM

## 2022-10-14 DIAGNOSIS — I5021 Acute systolic (congestive) heart failure: Secondary | ICD-10-CM | POA: Diagnosis not present

## 2022-10-14 DIAGNOSIS — I483 Typical atrial flutter: Secondary | ICD-10-CM | POA: Diagnosis not present

## 2022-10-14 DIAGNOSIS — R570 Cardiogenic shock: Secondary | ICD-10-CM | POA: Diagnosis not present

## 2022-10-14 DIAGNOSIS — R918 Other nonspecific abnormal finding of lung field: Secondary | ICD-10-CM | POA: Diagnosis not present

## 2022-10-14 DIAGNOSIS — I9581 Postprocedural hypotension: Secondary | ICD-10-CM | POA: Diagnosis not present

## 2022-10-14 DIAGNOSIS — E875 Hyperkalemia: Secondary | ICD-10-CM | POA: Diagnosis not present

## 2022-10-14 DIAGNOSIS — Z803 Family history of malignant neoplasm of breast: Secondary | ICD-10-CM

## 2022-10-14 DIAGNOSIS — I428 Other cardiomyopathies: Secondary | ICD-10-CM | POA: Diagnosis present

## 2022-10-14 DIAGNOSIS — N183 Chronic kidney disease, stage 3 unspecified: Secondary | ICD-10-CM | POA: Diagnosis not present

## 2022-10-14 DIAGNOSIS — I071 Rheumatic tricuspid insufficiency: Secondary | ICD-10-CM | POA: Diagnosis not present

## 2022-10-14 DIAGNOSIS — I4892 Unspecified atrial flutter: Secondary | ICD-10-CM | POA: Diagnosis present

## 2022-10-14 DIAGNOSIS — N179 Acute kidney failure, unspecified: Secondary | ICD-10-CM | POA: Diagnosis not present

## 2022-10-14 DIAGNOSIS — J9601 Acute respiratory failure with hypoxia: Secondary | ICD-10-CM | POA: Diagnosis not present

## 2022-10-14 DIAGNOSIS — I493 Ventricular premature depolarization: Secondary | ICD-10-CM | POA: Diagnosis present

## 2022-10-14 DIAGNOSIS — Z9081 Acquired absence of spleen: Secondary | ICD-10-CM

## 2022-10-14 DIAGNOSIS — I130 Hypertensive heart and chronic kidney disease with heart failure and stage 1 through stage 4 chronic kidney disease, or unspecified chronic kidney disease: Secondary | ICD-10-CM | POA: Diagnosis not present

## 2022-10-14 DIAGNOSIS — D860 Sarcoidosis of lung: Secondary | ICD-10-CM | POA: Diagnosis present

## 2022-10-14 DIAGNOSIS — Z8249 Family history of ischemic heart disease and other diseases of the circulatory system: Secondary | ICD-10-CM

## 2022-10-14 DIAGNOSIS — I480 Paroxysmal atrial fibrillation: Secondary | ICD-10-CM | POA: Diagnosis present

## 2022-10-14 DIAGNOSIS — D751 Secondary polycythemia: Secondary | ICD-10-CM | POA: Diagnosis not present

## 2022-10-14 DIAGNOSIS — E669 Obesity, unspecified: Secondary | ICD-10-CM | POA: Diagnosis present

## 2022-10-14 DIAGNOSIS — G4733 Obstructive sleep apnea (adult) (pediatric): Secondary | ICD-10-CM | POA: Diagnosis not present

## 2022-10-14 DIAGNOSIS — R42 Dizziness and giddiness: Secondary | ICD-10-CM | POA: Diagnosis not present

## 2022-10-14 DIAGNOSIS — Z79899 Other long term (current) drug therapy: Secondary | ICD-10-CM

## 2022-10-14 DIAGNOSIS — Z888 Allergy status to other drugs, medicaments and biological substances status: Secondary | ICD-10-CM

## 2022-10-14 DIAGNOSIS — E1122 Type 2 diabetes mellitus with diabetic chronic kidney disease: Secondary | ICD-10-CM | POA: Diagnosis not present

## 2022-10-14 DIAGNOSIS — Z809 Family history of malignant neoplasm, unspecified: Secondary | ICD-10-CM

## 2022-10-14 DIAGNOSIS — R0989 Other specified symptoms and signs involving the circulatory and respiratory systems: Secondary | ICD-10-CM | POA: Diagnosis not present

## 2022-10-14 DIAGNOSIS — R0602 Shortness of breath: Secondary | ICD-10-CM | POA: Diagnosis not present

## 2022-10-14 DIAGNOSIS — I4819 Other persistent atrial fibrillation: Secondary | ICD-10-CM | POA: Diagnosis present

## 2022-10-14 DIAGNOSIS — I5022 Chronic systolic (congestive) heart failure: Secondary | ICD-10-CM | POA: Diagnosis not present

## 2022-10-14 DIAGNOSIS — Z7901 Long term (current) use of anticoagulants: Secondary | ICD-10-CM

## 2022-10-14 DIAGNOSIS — R002 Palpitations: Secondary | ICD-10-CM | POA: Diagnosis not present

## 2022-10-14 DIAGNOSIS — I429 Cardiomyopathy, unspecified: Secondary | ICD-10-CM

## 2022-10-14 DIAGNOSIS — E871 Hypo-osmolality and hyponatremia: Secondary | ICD-10-CM | POA: Diagnosis not present

## 2022-10-14 DIAGNOSIS — I110 Hypertensive heart disease with heart failure: Secondary | ICD-10-CM | POA: Diagnosis not present

## 2022-10-14 DIAGNOSIS — R579 Shock, unspecified: Secondary | ICD-10-CM | POA: Diagnosis not present

## 2022-10-14 DIAGNOSIS — E876 Hypokalemia: Secondary | ICD-10-CM | POA: Diagnosis present

## 2022-10-14 DIAGNOSIS — J189 Pneumonia, unspecified organism: Secondary | ICD-10-CM | POA: Diagnosis not present

## 2022-10-14 DIAGNOSIS — E861 Hypovolemia: Secondary | ICD-10-CM | POA: Diagnosis present

## 2022-10-14 DIAGNOSIS — Z452 Encounter for adjustment and management of vascular access device: Secondary | ICD-10-CM | POA: Diagnosis not present

## 2022-10-14 DIAGNOSIS — Z91030 Bee allergy status: Secondary | ICD-10-CM

## 2022-10-14 DIAGNOSIS — K5900 Constipation, unspecified: Secondary | ICD-10-CM | POA: Diagnosis not present

## 2022-10-14 DIAGNOSIS — J984 Other disorders of lung: Secondary | ICD-10-CM | POA: Diagnosis not present

## 2022-10-14 LAB — CBC WITH DIFFERENTIAL/PLATELET
Abs Immature Granulocytes: 0.05 10*3/uL (ref 0.00–0.07)
Basophils Absolute: 0.1 10*3/uL (ref 0.0–0.1)
Basophils Relative: 1 %
Eosinophils Absolute: 0.6 10*3/uL — ABNORMAL HIGH (ref 0.0–0.5)
Eosinophils Relative: 5 %
HCT: 57.4 % — ABNORMAL HIGH (ref 39.0–52.0)
Hemoglobin: 19.4 g/dL — ABNORMAL HIGH (ref 13.0–17.0)
Immature Granulocytes: 0 %
Lymphocytes Relative: 23 %
Lymphs Abs: 2.6 10*3/uL (ref 0.7–4.0)
MCH: 29.1 pg (ref 26.0–34.0)
MCHC: 33.8 g/dL (ref 30.0–36.0)
MCV: 86.2 fL (ref 80.0–100.0)
Monocytes Absolute: 1.3 10*3/uL — ABNORMAL HIGH (ref 0.1–1.0)
Monocytes Relative: 12 %
Neutro Abs: 6.6 10*3/uL (ref 1.7–7.7)
Neutrophils Relative %: 59 %
Platelets: 191 10*3/uL (ref 150–400)
RBC Morphology: NONE SEEN
RBC: 6.66 MIL/uL — ABNORMAL HIGH (ref 4.22–5.81)
RDW: 17.5 % — ABNORMAL HIGH (ref 11.5–15.5)
WBC: 11.3 10*3/uL — ABNORMAL HIGH (ref 4.0–10.5)
nRBC: 0 % (ref 0.0–0.2)

## 2022-10-14 LAB — COMPREHENSIVE METABOLIC PANEL
ALT: 30 U/L (ref 0–44)
AST: 28 U/L (ref 15–41)
Albumin: 3.9 g/dL (ref 3.5–5.0)
Alkaline Phosphatase: 52 U/L (ref 38–126)
Anion gap: 11 (ref 5–15)
BUN: 43 mg/dL — ABNORMAL HIGH (ref 6–20)
CO2: 29 mmol/L (ref 22–32)
Calcium: 8.9 mg/dL (ref 8.9–10.3)
Chloride: 98 mmol/L (ref 98–111)
Creatinine, Ser: 1.91 mg/dL — ABNORMAL HIGH (ref 0.61–1.24)
GFR, Estimated: 40 mL/min — ABNORMAL LOW (ref >60)
Glucose, Bld: 75 mg/dL (ref 70–99)
Potassium: 4.1 mmol/L (ref 3.5–5.1)
Sodium: 138 mmol/L (ref 135–145)
Total Bilirubin: 0.9 mg/dL (ref 0.3–1.2)
Total Protein: 6.5 g/dL (ref 6.5–8.1)

## 2022-10-14 LAB — LACTIC ACID, PLASMA: Lactic Acid, Venous: 1.4 mmol/L (ref 0.5–1.9)

## 2022-10-14 MED ORDER — ENTRESTO 49-51 MG PO TABS: 1.0000 | ORAL_TABLET | Freq: Two times a day (BID) | ORAL | 11 refills | Status: DC

## 2022-10-14 MED ORDER — APIXABAN 5 MG PO TABS
5.0000 mg | ORAL_TABLET | Freq: Two times a day (BID) | ORAL | Status: DC
  Administered 2022-10-14: 5 mg via ORAL
  Filled 2022-10-14: qty 2

## 2022-10-14 MED ORDER — ETOMIDATE 2 MG/ML IV SOLN
10.0000 mg | Freq: Once | INTRAVENOUS | Status: DC
  Filled 2022-10-14: qty 10

## 2022-10-14 MED ORDER — ETOMIDATE 2 MG/ML IV SOLN
INTRAVENOUS | Status: AC | PRN
  Administered 2022-10-14: 5 mg via INTRAVENOUS
  Administered 2022-10-14: 2.5 mg via INTRAVENOUS

## 2022-10-14 NOTE — H&P (Signed)
Cardiology Admission History and Physical   Patient ID: Ladonte Speciale MRN: [id number]; DOB: Apr 04, 1965   Admission date: 10/14/2022  PCP:  Kristian Covey, MD   [facility] Providers Cardiologist:  Armanda Magic, MD  Sleep Medicine:  Armanda Magic, MD  { Click here to update MD or APP on Care Team, Refresh:1}     Chief Complaint:  lightheadness with hypotension following DCCV  Patient Profile:   Alvester Knoedler is a 57 y.o. male with NICM with EF 30-35%, chronic systolic CHF, parox A-Flutter with RVR and HTN who is being seen 10/14/2022 for the evaluation of hypotension following DCCV on 10/14/2022.  History of Present Illness:   Mr. Hollings reports symptoms of tachypalpitations for about 5 days prior to current presentation.  He was seen in atrial fibrillation clinic on 10/10/2022 and noted to be in atrial flutter with RVR.  Plans were to pursue admission for Tikosyn load.  Ranolazine was stopped in anticipation of Tikosyn start and Coreg was increased to 37.5 mg twice daily.  He presented to [facility] due to concern for recurrent heart racing and lightheadedness.  He was tachycardic on arrival with heart rates in the 140s and SBP in the 120s-140s.  Afebrile and on room air.  CBC with WBC 11.3, Hgb 19.4 (similar from prior), PLT 191.  CMP with no electrolyte abnormalities, creatinine at baseline at 1.9 and normal LFTs.  CXR with mild pulmonary edema.  EKG with atrial fibrillation with RVR.  Underwent synchronized cardioversion, sedation with etomidate 7.5 mg total.  Following cardioversion, BP as low as 70s/60s.  Per report, mentating throughout and no supplemental O2 requirement.  Extremities were cool though patient noted that this was his baseline.  Lactate performed and 1.4.  10/01/22 Summary of relevant CV history: - Cath in 01/2010 demonstrating normal cors.  Echo done in 01/2010 demonstrated EF 30-35% - pAF 4/17. Started on eliquis.  - seen in May 2017. At that time we  switched losartan to [facility], had + sleep study with severe OSA 64/hr. Also saw Dr. Graciela Husbands in EP. Holter monitor placed to quantify PVCs---> - 6.3%. Unable to tolerate amio in past due to severe nausea. Remains on Ranexa 500 bid to try to suppress PVCs.  - Underwent DC-CV on 06/19/17 for recurrent AF. Seen in AF Clinic on 07/02/17. Felt not to be ideal candidate for AF ablation - Echo 12/21 EF 40-45% - CT angio in 2019 aortic root was 4.6 cm - CT coronary 03/06/20. Calcium score 0. Poor arterial imaging but no obvious CAD - Underwent bronchoscopy with biopsy of mediastinal nodes. 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 Treated with prednisone.  - Saw Dr. Gala Romney 05/2021. Stable at that time. Torsemide was cut back to 20 mg daily.  - Evaluated in HF clinic 09/29/22 and was back in A fib and volume overloaded. Started on Furoscix and torsemide was increased to 20 mg twice a day. He took Furoscix 09/29/22. Had good response with increased urine ouput.  - 10/01/22, he had decreased coreg to 12.5 mg twice a day and entresto to 49-51 mg twice a day. He has been taking eliquis twice a day.  Successful DCCV for A flutter with RVR 10/02/22. - 10/10/22 Afib clinic, noted to be in Aflutter with RVR.  Plans to pursue Tikosyn.  Ranolazine stopped (in anticipation of Tikosyn start) and Coreg increased to 37.5mg  BID.      Past Medical History:  Diagnosis Date   Chronic bronchitis    HTN (hypertension)  NICM (nonischemic cardiomyopathy) (HCC)    Obesity (BMI 30-39.9) 12/04/2015   OSA (obstructive sleep apnea) 08/19/2015   Severe with AHI 64/hr now on CPAP at 13cm H2O   PAF (paroxysmal atrial fibrillation) (HCC)    Systolic CHF, chronic (HCC)     Past Surgical History:  Procedure Laterality Date   APPENDECTOMY     BRONCHIAL WASHINGS  04/24/2020   Procedure: BRONCHIAL WASHINGS;  Surgeon: Leslye Peer, MD;  Location: [facility] ENDOSCOPY;  Service: Pulmonary;;   CARDIAC CATHETERIZATION     CARDIOVERSION N/A 06/19/2017    Procedure: CARDIOVERSION;  Surgeon: Dolores Patty, MD;  Location: [facility] ENDOSCOPY;  Service: Cardiovascular;  Laterality: N/A;   CARDIOVERSION N/A 10/02/2022   Procedure: CARDIOVERSION;  Surgeon: Dolores Patty, MD;  Location: [facility] INVASIVE CV LAB;  Service: Cardiovascular;  Laterality: N/A;   FINE NEEDLE ASPIRATION  04/24/2020   Procedure: FINE NEEDLE ASPIRATION (FNA) LINEAR;  Surgeon: Leslye Peer, MD;  Location: [facility] ENDOSCOPY;  Service: Pulmonary;;   LIPOMA RESECTION     arms   SPLENECTOMY     SPLIT NIGHT STUDY  08/12/2015   VIDEO BRONCHOSCOPY WITH ENDOBRONCHIAL ULTRASOUND N/A 04/24/2020   Procedure: VIDEO BRONCHOSCOPY WITH ENDOBRONCHIAL ULTRASOUND;  Surgeon: Leslye Peer, MD;  Location: [facility] ENDOSCOPY;  Service: Pulmonary;  Laterality: N/A;     Medications Prior to Admission: Prior to Admission medications   Medication Sig Start Date End Date Taking? Authorizing Provider  albuterol (VENTOLIN HFA) 108 (90 Base) MCG/ACT inhaler TAKE 2 PUFFS BY MOUTH EVERY 6 HOURS AS NEEDED FOR WHEEZE OR SHORTNESS OF BREATH 02/24/22   Leslye Peer, MD  carvedilol (COREG) 25 MG tablet Take 1 tablet (25 mg total) by mouth daily in the afternoon. Patient taking differently: Take 12.5 mg by mouth 2 (two) times daily with a meal. 09/05/22   [name], Elberta Fortis, MD  DULoxetine (CYMBALTA) 20 MG capsule TAKE 1 CAPSULE BY MOUTH EVERY DAY 11/12/21   Bensimhon, Bevelyn Buckles, MD  ELIQUIS 5 MG TABS tablet TAKE 1 TABLET BY MOUTH TWICE A DAY 10/10/22   Bensimhon, Bevelyn Buckles, MD  methocarbamol (ROBAXIN) 500 MG tablet Take 500 mg by mouth 2 (two) times daily as needed for muscle spasms.    [provider]  NEEDLE, DISP, 20 G (BD DISP NEEDLES) 20G X 1-1/2" MISC Use as directed 11/29/21   [name], Elberta Fortis, MD  NEEDLE, DISP, 22 G 22G X 1" MISC Use to inject testosterone into the muscle once every 14 days. 10/19/20   [name], Elberta Fortis, MD  NON FORMULARY once a week. Zepbound  Compound    [provider]  potassium  chloride SA (KLOR-CON M20) 20 MEQ tablet TAKE 4 TABLETS BY MOUTH EVERY MORNING AND 2 TABLETS DAILY W/ LUNCH AND 4 TABS EVERY EVENING. Patient taking differently: TAKE 4 TABLETS BY MOUTH EVERY MORNING AND 2 TABLETS DAILY W/ LUNCH AND 4 TABS EVERY EVENING.  Patient states he will start back taking his Potassium- Taking 1 tablet by mouth daily 02/11/22   Bensimhon, Bevelyn Buckles, MD  ranolazine (RANEXA) 500 MG 12 hr tablet TAKE 1 TABLET BY MOUTH TWICE A DAY 10/10/22   Bensimhon, Bevelyn Buckles, MD  sacubitril-valsartan (ENTRESTO) 49-51 MG Take 1 tablet by mouth 2 (two) times daily. 10/14/22   Clegg, Amy D, NP  Semaglutide-Weight Management (WEGOVY) 2.4 MG/0.75ML SOAJ Inject 2.4 mg into the skin once a week.    [provider]  sildenafil (VIAGRA) 100 MG tablet TAKE 1 TABLET BY MOUTH  EVERY DAY AS NEEDED FOR ERECTILE DYSFUNCTION 10/22/21   Bensimhon, Bevelyn Buckles, MD  testosterone cypionate (DEPOTESTOSTERONE CYPIONATE) 200 MG/ML injection Inject 1.5 mLs (300 mg total) into the muscle every 14 (fourteen) days. 11/29/21   [name], Elberta Fortis, MD  torsemide (DEMADEX) 20 MG tablet Take 0.5 tablets (10 mg total) by mouth 2 (two) times daily. 10/01/22   Tonye Becket D, NP     Allergies:    Allergies  Allergen Reactions   Bee Venom Anaphylaxis   Amiodarone Nausea Only   Spironolactone     Social History:   Social History   Socioeconomic History   Marital status: Married    Spouse name: Not on file   Number of children: 3   Years of education: Not on file   Highest education level: Not on file  Occupational History   Occupation: realtor  Tobacco Use   Smoking status: Never   Smokeless tobacco: Never  Vaping Use   Vaping status: Never Used  Substance and Sexual Activity   Alcohol use: Yes    Comment: occasional   Drug use: Never   Sexual activity: Not on file  Other Topics Concern   Not on file  Social History Narrative   Not on file   Social Determinants of Health   Financial Resource Strain: Not  on file  Food Insecurity: Not on file  Transportation Needs: Not on file  Physical Activity: Not on file  Stress: Not on file  Social Connections: Not on file  Intimate Partner Violence: Not on file    Family History:  *** The patient's family history includes Breast cancer in his mother; Cancer in his father; Heart disease in an other family member; Hypertension in an other family member. There is no history of Stomach cancer, Colon cancer, Esophageal cancer, Pancreatic cancer, or Rectal cancer.    ROS:  Please see the history of present illness.  ***All other ROS reviewed and negative.     Physical Exam/Data:   Vitals:   10/14/22 2125 10/14/22 2130 10/14/22 2135 10/14/22 2218  BP:  (!) 71/61    Pulse: 99 97 99   Resp: (!) 38 19 19   Temp:      TempSrc:      SpO2: 97% 95% 94%   Weight:    (!) 141.5 kg  Height:    6\' 5"  (1.956 m)   No intake or output data in the 24 hours ending 10/14/22 2318    10/14/2022   10:18 PM 10/10/2022    2:07 PM 10/02/2022   12:02 PM  Last 3 Weights  Weight (lbs) 312 lb 313 lb 3.2 oz 317 lb  Weight (kg) 141.522 kg 142.067 kg 143.79 kg     Body mass index is 37 kg/m.  General:  Well nourished, well developed, in no acute distress*** HEENT: normal Neck: no*** JVD Vascular: No carotid bruits; Distal pulses 2+ bilaterally   Cardiac:  normal S1, S2; RRR; no murmur *** Lungs:  clear to auscultation bilaterally, no wheezing, rhonchi or rales  Abd: soft, nontender, no hepatomegaly  Ext: no*** edema Musculoskeletal:  No deformities, BUE and BLE strength normal and equal Skin: warm and dry  Neuro:  CNs 2-12 intact, no focal abnormalities noted Psych:  Normal affect    EKG:  The ECG that was done 10/14/22 was personally reviewed and demonstrates sinus tachycardia  Relevant CV Studies: ECHO  2023 EF 35-40% RV normal, no significant valvular disease 2020 EF 35-40%. RV ok. No  TR. AoRoot 4.1 cm 2019 EF 35-40%. RV ok. 2018 EF 35% Grade IDD RV  normal. 2017 EF 35-40%   2016 LVEF 30-35%, trivial AI, Mod LAE   PET 2023  1. Abnormal apical myocardial perfusion.   2. Abnormal left ventricular systolic function, wall motion, apical thickening, and dilated LV.   Non-diagnostic CT obtained for PET attenuation correction:   1. There are no discernible coronary artery calcifications.   2. There are areas of extracardiac hypermetabolic activity noted on the limited field of view.This includes mediastinum and hilar lymphnodes. This could represent extra cardiac sarcoidosis or other process.If clinically concern a diagnostic whole  body PET/CT may be obtained for further evaluation.   Laboratory Data:  High Sensitivity Troponin:  No results for input(s): "TROPONINIHS" in the last 720 hours.    Chemistry Recent Labs  Lab 10/14/22 1841  NA 138  K 4.1  CL 98  CO2 29  GLUCOSE 75  BUN 43*  CREATININE 1.91*  CALCIUM 8.9  GFRNONAA 40*  ANIONGAP 11    Recent Labs  Lab 10/14/22 1841  PROT 6.5  ALBUMIN 3.9  AST 28  ALT 30  ALKPHOS 52  BILITOT 0.9   Lipids No results for input(s): "CHOL", "TRIG", "HDL", "LABVLDL", "LDLCALC", "CHOLHDL" in the last 168 hours. Hematology Recent Labs  Lab 10/14/22 1841  WBC 11.3*  RBC 6.66*  HGB 19.4*  HCT 57.4*  MCV 86.2  MCH 29.1  MCHC 33.8  RDW 17.5*  PLT 191   Thyroid No results for input(s): "TSH", "FREET4" in the last 168 hours. BNPNo results for input(s): "BNP", "PROBNP" in the last 168 hours.  DDimer No results for input(s): "DDIMER" in the last 168 hours.   Radiology/Studies:  DG Chest Portable 1 View  Result Date: 10/14/2022 CLINICAL DATA:  Hypotension. EXAM: PORTABLE CHEST 1 VIEW COMPARISON:  Chest CT 09/26/2020 FINDINGS: The heart is enlarged but grossly stable. Costophrenic angles and left lung base not included in the field of view. Suggestion of pulmonary edema. No large pleural effusion. No pneumothorax. IMPRESSION: Cardiomegaly with suggestion of pulmonary edema.  Electronically Signed   By: Narda Rutherford M.D.   On: 10/14/2022 22:22     Assessment and Plan:   #Symptomatic hypotension following synchronized cardioversion for atrial fibrillation with RVR Monitor BP Repeat lactate *** Echo Hold home Coreg, last prescribed 37.5 mg twice daily Hold home Entresto, most recently on 49-51 mg twice daily *** diuresis - home Torsemide 20mg  BID Hold home eplerenone 25 mg daily (has not been taking recently)  # Atrial fibrillation/flutter C2V 2 (HF, HTN) Mag Keep K >4 and Mag >2 Continue uninterrupted apixaban 5 mg twice daily Intolerant of Amiodarone previous (nausea/vomiting)  # Chronic systolic heart failure # Nonischemic cardiomyopathy BNP Echo Hold GDMT as above *** diuresis  #PVCs Ranolazine recently discontinued Hold beta-blocker as above  # OSA Home CPAP  Risk Assessment/Risk Scores:  {Complete the following score calculators/questions to meet required metrics.  Press F2:1}  {Is the patient being seen for unstable angina, ACS, NSTEMI or STEMI?[phone]} {Does this patient have CHF or CHF symptoms?      :[id number]} {Does this patient have ATRIAL FIBRILLATION?:[phone]}  Code Status: {Please document patient code status       :29822}  Severity of Illness: {Observation/Inpatient:21159}   For questions or updates, please contact [facility] Please consult [email or web address] for contact info under     Signed, Aundra Dubin, MD  10/14/2022 11:18 PM

## 2022-10-14 NOTE — Telephone Encounter (Signed)
Medication list reviewed in anticipation of upcoming Tikosyn initiation. Patient is taking 1 contraindicated and 1 QTc prolonging medications.   Ranolazine- use with dofetilide is contraindicated due to possibility of increasing dofetilide concentration. Patient will need to come off of ranolazine Albuterol- Can increase QTc. This is an as needed medication. Recommend monitoring QTc Torsemide- can cause electrolyte disturbances which can increase the risk of arrhythmia. Please monitor K and Mag closely and insure K is >4 and Mag is >2.   Patient is anticoagulated on Eliquis on the appropriate dose. Please ensure that patient has not missed any anticoagulation doses in the 3 weeks prior to Tikosyn initiation.   Patient will need to be counseled to avoid use of Benadryl while on Tikosyn and in the 2-3 days prior to Tikosyn initiation.

## 2022-10-14 NOTE — Telephone Encounter (Signed)
    Received after hours page from answering service advising that patient called to report symptoms of recurrent afib with RVR. He was recently cardioverted during an admission at the end of July. I called and spoke with patient who confirms that palpitations and sensation of rapid HR began this evening. Patient advised that he was getting in the car (with spouse driving) to go to the ED for evaluation. I certainly agree that urgent evaluation is needed with symptomatic afib with RVR. [facility] will be available to assist in care as needed.   Perlie Gold, PA-C

## 2022-10-14 NOTE — Progress Notes (Signed)
I was present for the duration of the sedation procedure.  Patient remained stable and comfortable on EtCO2 monitoring.

## 2022-10-14 NOTE — Sedation Documentation (Signed)
Shocked delivered by Doran Durand, MD.

## 2022-10-14 NOTE — ED Triage Notes (Signed)
Pt states he went into afib a couple weeks ago, electrically cardioverted. States he feels he is now back in afib. Endorses fatigue, "fuzzy head." States he is on eliquis, last dose this AM.

## 2022-10-14 NOTE — Telephone Encounter (Signed)
Pt notified to stop ranexa at least 3 days prior to admission.

## 2022-10-14 NOTE — ED Notes (Signed)
Verbal Report given to Casimiro Needle, RN. ([facility]).

## 2022-10-14 NOTE — ED Provider Notes (Signed)
[place] EMERGENCY DEPARTMENT AT [facility] Provider Note   CSN: [id number] Arrival date & time: 10/14/22  1829     History Chief Complaint  Patient presents with   Atrial Fibrillation    HPI Harold Park is a 57 y.o. male presenting for chief complaint of palpitations. States that he started feeling his heart racing approximately 5 days ago and started feeling lightheaded today.  He was seen at cardiology, seem to be in a flutter his carvedilol was raised.  He was recommended for Tikosyn treatment which initially was going to happen yesterday but ultimately he has declined this intervention due to concern for side effects. Was called by his cardiologist to come in for further care and management..   Patient's recorded medical, surgical, social, medication list and allergies were reviewed in the Snapshot window as part of the initial history.   Review of Systems   Review of Systems  Constitutional:  Negative for chills and fever.  HENT:  Negative for ear pain and sore throat.   Eyes:  Negative for pain and visual disturbance.  Respiratory:  Negative for cough and shortness of breath.   Cardiovascular:  Negative for chest pain and palpitations.  Gastrointestinal:  Negative for abdominal pain and vomiting.  Genitourinary:  Negative for dysuria and hematuria.  Musculoskeletal:  Negative for arthralgias and back pain.  Skin:  Negative for color change and rash.  Neurological:  Positive for light-headedness. Negative for seizures and syncope.  All other systems reviewed and are negative.   Physical Exam Updated Vital Signs BP (!) 71/61   Pulse 99   Temp 98.2 F (36.8 C) (Oral)   Resp 19   Ht 6\' 5"  (1.956 m)   Wt (!) 141.5 kg   SpO2 94%   BMI 37.00 kg/m  Physical Exam Vitals and nursing note reviewed.  Constitutional:      General: He is not in acute distress.    Appearance: He is well-developed.  HENT:     Head: Normocephalic and atraumatic.  Eyes:      Conjunctiva/sclera: Conjunctivae normal.  Cardiovascular:     Rate and Rhythm: Normal rate and regular rhythm.     Heart sounds: No murmur heard. Pulmonary:     Effort: Pulmonary effort is normal. No respiratory distress.     Breath sounds: Normal breath sounds.  Abdominal:     Palpations: Abdomen is soft.     Tenderness: There is no abdominal tenderness.  Musculoskeletal:        General: No swelling.     Cervical back: Neck supple.     Right lower leg: Edema present.     Left lower leg: Edema present.  Skin:    General: Skin is warm and dry.     Capillary Refill: Capillary refill takes less than 2 seconds.  Neurological:     Mental Status: He is alert.  Psychiatric:        Mood and Affect: Mood normal.      ED Course/ Medical Decision Making/ A&P Clinical Course as of 10/14/22 2301  Tue Oct 14, 2022  1918 Consult with cardiology Cardioversion, eval in person  [CC]  2204 EF down to 15 [CC]    Clinical Course User Index [CC] Glyn Ade, MD    Procedures .Critical Care  Performed by: Glyn Ade, MD Authorized by: Glyn Ade, MD   Critical care provider statement:    Critical care time (minutes):  95   Critical care was necessary to treat  or prevent imminent or life-threatening deterioration of the following conditions:  Circulatory failure, cardiac failure and shock   Critical care was time spent personally by me on the following activities:  Development of treatment plan with patient or surrogate, discussions with consultants, evaluation of patient's response to treatment, examination of patient, ordering and review of laboratory studies, ordering and review of radiographic studies, ordering and performing treatments and interventions, pulse oximetry, re-evaluation of patient's condition and review of old charts   I assumed direction of critical care for this patient from another provider in my specialty: yes     Care discussed with: admitting  provider   .Cardioversion  Date/Time: 10/14/2022 10:52 PM  Performed by: Glyn Ade, MD Authorized by: Glyn Ade, MD   Consent:    Consent obtained:  Verbal   Consent given by:  Patient   Risks discussed:  Cutaneous burn, death, induced arrhythmia and pain   Alternatives discussed:  No treatment Pre-procedure details:    Cardioversion basis:  Emergent   Rhythm:  Atrial fibrillation   Electrode placement:  Anterior-posterior Patient sedated: Yes. Refer to sedation procedure documentation for details of sedation.  Attempt one:    Cardioversion mode:  Synchronous   Shock (Joules):  200   Shock outcome:  No change in rhythm Post-procedure details:    Patient status:  Awake   Patient tolerance of procedure:  Tolerated well, no immediate complications Ultrasound ED Echo  Date/Time: 10/14/2022 10:53 PM  Performed by: Glyn Ade, MD Authorized by: Glyn Ade, MD   Procedure details:    Indications: hypotension     Views: subxiphoid, parasternal long axis view, parasternal short axis view, apical 4 chamber view and IVC view     Images: archived     Limitations:  Body habitus Findings:    Cardiac Activity: normal cardiac activity     LV Function: severly depressed (<30%)     RV Diameter: normal     IVC: dilated   Impression:    Impression: abnormal cardiac activity and probable elevated CVP      Medications Ordered in ED Medications  etomidate (AMIDATE) injection 10 mg (has no administration in time range)  etomidate (AMIDATE) injection (2.5 mg Intravenous Given 10/14/22 1938)  apixaban (ELIQUIS) tablet 5 mg (has no administration in time range)    Medical Decision Making:   This is a critically ill 57 year old male who presented with palpitations found to be in tacky dysrhythmia with heart rate in the 140s with symptomatic presyncope. Initial blood pressures stable though he intermittently had single blood pressure readings in the 80s over  60s. Given maps above 65, I consulted cardiology.  With his underlying heart failure, he is not a candidate for further beta-blockade or calcium channel blockade. He is compliant every day with his Eliquis.  Cardiology recommended stabilization via cardioversion with plan for reassessment of postprocedural setting. Patient cardioverted as above successfully with restoration normal sinus rhythm.  His postprocedural course was complicated by hypotension that was persistent.  Initially 90s over 60s followed by 80s over 60s and intermittent 70s over 60s.  MAP stayed above 65 throughout the entirety of his evaluation.  As his maps downtrended, performed an echocardiogram which was completed as above.  It shows no pericardial effusion but it does show severely depressed LV function.  Personally performed fractional shortening with results of 17 and 15 percents from different reviews.  His baseline is 35%. Concern for myocardial stunting in the setting of CHF exacerbation/developing cardiogenic shock Performed  a subcostal view that demonstrates retrograde flow in his IVC during expiration. Concern for developing cardiogenic shock.  Extremities are cool.  Mental status is fortunately maintained.  Consulted cardiology again and performed a lactic acid.  Cardiology reviewed all results over an extended consultation.  With normal lactic resulting during the conversation, they feel comfortable with floor level care with plan for repeat echocardiogram or catheterization tomorrow if his symptoms are persistent. Discussed starting an epinephrine infusion for cardiogenic shock but they do not feel that it is necessary with a normal lactic they stated they would follow-up the results on the inpatient side of serial testing if needed. Discussed all this with the patient who is willing to be admitted over at main campus for further diagnostic care and management.  Disposition:   Based on the above findings, I believe this  patient is stable for admission.    Patient/family educated about specific findings on our evaluation and explained exact reasons for admission.  Patient/family educated about clinical situation and time was allowed to answer questions.   Admission team communicated with and agreed with need for admission. Patient admitted. Patient  ready to move at this time.     Emergency Department Medication Summary:   Medications  etomidate (AMIDATE) injection 10 mg (has no administration in time range)  etomidate (AMIDATE) injection (2.5 mg Intravenous Given 10/14/22 1938)  apixaban (ELIQUIS) tablet 5 mg (has no administration in time range)    Clinical Impression:  1. Cardiogenic shock (HCC)      Admit   Final Clinical Impression(s) / ED Diagnoses Final diagnoses:  Cardiogenic shock [facility])    Rx / DC Orders ED Discharge Orders     None         Glyn Ade, MD 10/14/22 2301

## 2022-10-15 ENCOUNTER — Telehealth (HOSPITAL_COMMUNITY): Payer: Self-pay

## 2022-10-15 ENCOUNTER — Other Ambulatory Visit: Payer: Self-pay

## 2022-10-15 ENCOUNTER — Observation Stay (HOSPITAL_COMMUNITY): Payer: BC Managed Care – PPO

## 2022-10-15 DIAGNOSIS — E875 Hyperkalemia: Secondary | ICD-10-CM | POA: Diagnosis not present

## 2022-10-15 DIAGNOSIS — I130 Hypertensive heart and chronic kidney disease with heart failure and stage 1 through stage 4 chronic kidney disease, or unspecified chronic kidney disease: Secondary | ICD-10-CM | POA: Diagnosis not present

## 2022-10-15 DIAGNOSIS — E1165 Type 2 diabetes mellitus with hyperglycemia: Secondary | ICD-10-CM | POA: Diagnosis not present

## 2022-10-15 DIAGNOSIS — R579 Shock, unspecified: Secondary | ICD-10-CM | POA: Diagnosis not present

## 2022-10-15 DIAGNOSIS — J9601 Acute respiratory failure with hypoxia: Secondary | ICD-10-CM | POA: Diagnosis not present

## 2022-10-15 DIAGNOSIS — I472 Ventricular tachycardia, unspecified: Secondary | ICD-10-CM | POA: Diagnosis not present

## 2022-10-15 DIAGNOSIS — J189 Pneumonia, unspecified organism: Secondary | ICD-10-CM | POA: Diagnosis not present

## 2022-10-15 DIAGNOSIS — I5023 Acute on chronic systolic (congestive) heart failure: Secondary | ICD-10-CM | POA: Diagnosis not present

## 2022-10-15 DIAGNOSIS — I4892 Unspecified atrial flutter: Secondary | ICD-10-CM | POA: Diagnosis not present

## 2022-10-15 DIAGNOSIS — I428 Other cardiomyopathies: Secondary | ICD-10-CM | POA: Diagnosis not present

## 2022-10-15 DIAGNOSIS — N1832 Chronic kidney disease, stage 3b: Secondary | ICD-10-CM | POA: Diagnosis not present

## 2022-10-15 DIAGNOSIS — E1122 Type 2 diabetes mellitus with diabetic chronic kidney disease: Secondary | ICD-10-CM | POA: Diagnosis not present

## 2022-10-15 DIAGNOSIS — I493 Ventricular premature depolarization: Secondary | ICD-10-CM | POA: Diagnosis not present

## 2022-10-15 DIAGNOSIS — I071 Rheumatic tricuspid insufficiency: Secondary | ICD-10-CM | POA: Diagnosis not present

## 2022-10-15 DIAGNOSIS — R570 Cardiogenic shock: Secondary | ICD-10-CM | POA: Diagnosis present

## 2022-10-15 DIAGNOSIS — D6869 Other thrombophilia: Secondary | ICD-10-CM | POA: Diagnosis not present

## 2022-10-15 DIAGNOSIS — I9581 Postprocedural hypotension: Secondary | ICD-10-CM | POA: Diagnosis present

## 2022-10-15 DIAGNOSIS — I959 Hypotension, unspecified: Secondary | ICD-10-CM | POA: Diagnosis not present

## 2022-10-15 DIAGNOSIS — E871 Hypo-osmolality and hyponatremia: Secondary | ICD-10-CM | POA: Diagnosis not present

## 2022-10-15 DIAGNOSIS — I4819 Other persistent atrial fibrillation: Secondary | ICD-10-CM | POA: Diagnosis not present

## 2022-10-15 DIAGNOSIS — D751 Secondary polycythemia: Secondary | ICD-10-CM | POA: Diagnosis not present

## 2022-10-15 DIAGNOSIS — I480 Paroxysmal atrial fibrillation: Secondary | ICD-10-CM | POA: Diagnosis not present

## 2022-10-15 DIAGNOSIS — E669 Obesity, unspecified: Secondary | ICD-10-CM | POA: Diagnosis not present

## 2022-10-15 DIAGNOSIS — I5022 Chronic systolic (congestive) heart failure: Secondary | ICD-10-CM

## 2022-10-15 DIAGNOSIS — N179 Acute kidney failure, unspecified: Secondary | ICD-10-CM | POA: Diagnosis not present

## 2022-10-15 DIAGNOSIS — I5021 Acute systolic (congestive) heart failure: Secondary | ICD-10-CM

## 2022-10-15 DIAGNOSIS — I484 Atypical atrial flutter: Secondary | ICD-10-CM | POA: Diagnosis not present

## 2022-10-15 DIAGNOSIS — Z1152 Encounter for screening for COVID-19: Secondary | ICD-10-CM | POA: Diagnosis not present

## 2022-10-15 LAB — BRAIN NATRIURETIC PEPTIDE: B Natriuretic Peptide: 777.2 pg/mL — ABNORMAL HIGH (ref 0.0–100.0)

## 2022-10-15 LAB — COMPREHENSIVE METABOLIC PANEL
ALT: 34 U/L (ref 0–44)
AST: 28 U/L (ref 15–41)
Albumin: 3.5 g/dL (ref 3.5–5.0)
Alkaline Phosphatase: 51 U/L (ref 38–126)
Anion gap: 13 (ref 5–15)
BUN: 41 mg/dL — ABNORMAL HIGH (ref 6–20)
CO2: 21 mmol/L — ABNORMAL LOW (ref 22–32)
Calcium: 8.5 mg/dL — ABNORMAL LOW (ref 8.9–10.3)
Chloride: 100 mmol/L (ref 98–111)
Creatinine, Ser: 1.9 mg/dL — ABNORMAL HIGH (ref 0.61–1.24)
GFR, Estimated: 40 mL/min — ABNORMAL LOW (ref >60)
Glucose, Bld: 114 mg/dL — ABNORMAL HIGH (ref 70–99)
Potassium: 4.4 mmol/L (ref 3.5–5.1)
Sodium: 134 mmol/L — ABNORMAL LOW (ref 135–145)
Total Bilirubin: 0.8 mg/dL (ref 0.3–1.2)
Total Protein: 6.3 g/dL — ABNORMAL LOW (ref 6.5–8.1)

## 2022-10-15 LAB — COOXEMETRY PANEL
Carboxyhemoglobin: 2.3 % — ABNORMAL HIGH (ref 0.5–1.5)
Methemoglobin: <0.7 % (ref 0.0–1.5)
O2 Saturation: 60.5 %
Total hemoglobin: 18.1 g/dL — ABNORMAL HIGH (ref 12.0–16.0)

## 2022-10-15 LAB — ECHOCARDIOGRAM COMPLETE
Height: 77 in
S' Lateral: 6 cm
Weight: 5046.4 oz

## 2022-10-15 LAB — MAGNESIUM: Magnesium: 2.3 mg/dL (ref 1.7–2.4)

## 2022-10-15 LAB — LACTIC ACID, PLASMA: Lactic Acid, Venous: 1.6 mmol/L (ref 0.5–1.9)

## 2022-10-15 LAB — HIV ANTIBODY (ROUTINE TESTING W REFLEX): HIV Screen 4th Generation wRfx: NONREACTIVE

## 2022-10-15 MED ORDER — AMIODARONE HCL IN DEXTROSE 360-4.14 MG/200ML-% IV SOLN
60.0000 mg/h | INTRAVENOUS | Status: DC
  Administered 2022-10-16: 30 mg/h via INTRAVENOUS
  Administered 2022-10-16 – 2022-10-24 (×30): 60 mg/h via INTRAVENOUS
  Filled 2022-10-15 (×6): qty 200
  Filled 2022-10-15: qty 400
  Filled 2022-10-15 (×27): qty 200

## 2022-10-15 MED ORDER — SODIUM CHLORIDE 0.9% FLUSH: 3.0000 mL | INTRAVENOUS | Status: DC | PRN

## 2022-10-15 MED ORDER — APIXABAN 5 MG PO TABS
5.0000 mg | ORAL_TABLET | Freq: Two times a day (BID) | ORAL | Status: DC
  Administered 2022-10-15 – 2022-10-24 (×19): 5 mg via ORAL
  Filled 2022-10-15 (×19): qty 1

## 2022-10-15 MED ORDER — PERFLUTREN LIPID MICROSPHERE
1.0000 mL | INTRAVENOUS | Status: AC | PRN
  Administered 2022-10-15: 3 mL via INTRAVENOUS

## 2022-10-15 MED ORDER — AMIODARONE LOAD VIA INFUSION
150.0000 mg | Freq: Once | INTRAVENOUS | Status: AC
  Administered 2022-10-15: 150 mg via INTRAVENOUS
  Filled 2022-10-15: qty 83.34

## 2022-10-15 MED ORDER — SODIUM CHLORIDE 0.9 % IV SOLN: 250.0000 mL | INTRAVENOUS | Status: DC | PRN

## 2022-10-15 MED ORDER — ONDANSETRON HCL 4 MG/2ML IJ SOLN
4.0000 mg | Freq: Four times a day (QID) | INTRAMUSCULAR | Status: DC | PRN
  Administered 2022-10-15 – 2022-10-24 (×22): 4 mg via INTRAVENOUS
  Filled 2022-10-15 (×23): qty 2

## 2022-10-15 MED ORDER — ACETAMINOPHEN 325 MG PO TABS
650.0000 mg | ORAL_TABLET | ORAL | Status: DC | PRN
  Administered 2022-10-17 – 2022-10-23 (×10): 650 mg via ORAL
  Filled 2022-10-15 (×10): qty 2

## 2022-10-15 MED ORDER — CHLORHEXIDINE GLUCONATE CLOTH 2 % EX PADS
6.0000 | MEDICATED_PAD | Freq: Every day | CUTANEOUS | Status: DC
  Administered 2022-10-15 – 2022-10-24 (×10): 6 via TOPICAL

## 2022-10-15 MED ORDER — DULOXETINE HCL 20 MG PO CPEP
20.0000 mg | ORAL_CAPSULE | Freq: Every day | ORAL | Status: DC
  Administered 2022-10-15 – 2022-10-24 (×10): 20 mg via ORAL
  Filled 2022-10-15 (×10): qty 1

## 2022-10-15 MED ORDER — SODIUM CHLORIDE 0.9% FLUSH
10.0000 mL | Freq: Two times a day (BID) | INTRAVENOUS | Status: DC
  Administered 2022-10-15 – 2022-10-19 (×8): 10 mL
  Administered 2022-10-21: 20 mL
  Administered 2022-10-22: 10 mL

## 2022-10-15 MED ORDER — AMIODARONE HCL IN DEXTROSE 360-4.14 MG/200ML-% IV SOLN
60.0000 mg/h | INTRAVENOUS | Status: AC
  Administered 2022-10-15 (×2): 60 mg/h via INTRAVENOUS
  Filled 2022-10-15 (×2): qty 200

## 2022-10-15 MED ORDER — SODIUM CHLORIDE 0.9% FLUSH: 10.0000 mL | INTRAVENOUS | Status: DC | PRN

## 2022-10-15 MED ORDER — SODIUM CHLORIDE 0.9% FLUSH
3.0000 mL | Freq: Two times a day (BID) | INTRAVENOUS | Status: DC
  Administered 2022-10-15 – 2022-10-21 (×9): 3 mL via INTRAVENOUS

## 2022-10-15 NOTE — Progress Notes (Signed)
Peripherally Inserted Central Catheter Placement  The IV Nurse has discussed with the patient and/or persons authorized to consent for the patient, the purpose of this procedure and the potential benefits and risks involved with this procedure.  The benefits include less needle sticks, lab draws from the catheter, and the patient may be discharged home with the catheter. Risks include, but not limited to, infection, bleeding, blood clot (thrombus formation), and puncture of an artery; nerve damage and irregular heartbeat and possibility to perform a PICC exchange if needed/ordered by physician.  Alternatives to this procedure were also discussed.  Bard Power PICC patient education guide, fact sheet on infection prevention and patient information card has been provided to patient /or left at bedside.    PICC Placement Documentation  PICC Double Lumen 10/15/22 Right Basilic 48 cm 1 cm (Active)  Indication for Insertion or Continuance of Line Vasoactive infusions 10/15/22 1400  Exposed Catheter (cm) 1 cm 10/15/22 1400  Site Assessment Clean, Dry, Intact 10/15/22 1400  Lumen #1 Status Flushed;Saline locked;Blood return noted 10/15/22 1400  Lumen #2 Status Flushed;Saline locked;Blood return noted 10/15/22 1400  Dressing Type Transparent;Securing device 10/15/22 1400  Dressing Status Antimicrobial disc in place 10/15/22 1400  Line Care Connections checked and tightened 10/15/22 1400  Line Adjustment (NICU/IV Team Only) No 10/15/22 1400  Dressing Intervention New dressing 10/15/22 1400  Dressing Change Due 10/22/22 10/15/22 1400          10/15/2022, 2:07 PM

## 2022-10-15 NOTE — Progress Notes (Signed)
Heart Failure Navigator Progress Note  Assessed for Heart & Vascular TOC clinic readiness.  Patient does not meet criteria due to Advanced Heart Failure Team patient of Dr. Bensimhon.   Navigator will sign off at this time.    , BSN, RN Heart Failure Nurse Navigator Secure Chat Only   

## 2022-10-15 NOTE — Telephone Encounter (Signed)
This encounter was created in error - please disregard.

## 2022-10-15 NOTE — Addendum Note (Signed)
Addended by: Hyman Bower F on: 10/15/2022 03:24 PM   Modules accepted: Orders, Level of Service

## 2022-10-15 NOTE — TOC Initial Note (Signed)
Transition of Care [facility]) - Initial/Assessment Note    Patient Details  Name: Harold Park MRN: [id number] Date of Birth: Jan 22, 1966  Transition of [facility]) CM/SW Contact:    Elliot Cousin, RN Phone Number: [phone] 10/15/2022, 3:02 PM  Clinical Narrative:  HF TOC CM spoke to pt at bedside. States he was independent PTA. Wife is at home at to assist as needed. 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                  Expected Discharge Plan: Home/Self Care Barriers to Discharge: Continued Medical Work up   Patient Goals and CMS Choice Patient states their goals for this hospitalization and ongoing recovery are:: wants to recover          Expected Discharge Plan and Services   Discharge Planning Services: CM Consult   Living arrangements for the past 2 months: Single Family Home                                      Prior Living Arrangements/Services Living arrangements for the past 2 months: Single Family Home Lives with:: Spouse Patient language and need for interpreter reviewed:: Yes Do you feel safe going back to the place where you live?: Yes      Need for Family Participation in Patient Care: No (Comment) Care giver support system in place?: Yes (comment)   Criminal Activity/Legal Involvement Pertinent to Current Situation/Hospitalization: No - Comment as needed  Activities of Daily Living      Permission Sought/Granted Permission sought to share information with : Case Manager, Family Supports, PCP Permission granted to share information with : Yes, Verbal Permission Granted  Share Information with NAME: Eesa Tyska     Permission granted to share info w Relationship: wife  Permission granted to share info w Contact Information: [phone]  Emotional Assessment Appearance:: Appears stated age Attitude/Demeanor/Rapport: Engaged Affect (typically observed): Accepting Orientation: :  Oriented to Self, Oriented to Place, Oriented to  Time, Oriented to Situation   Psych Involvement: No (comment)  Admission diagnosis:  Cardiogenic shock (HCC) [R57.0] Heart failure (HCC) [I50.9] Hypotension after procedure [I95.81] Patient Active Problem List   Diagnosis Date Noted   Hypotension after procedure 10/15/2022   Heart failure (HCC) 10/14/2022   Hypercoagulable state due to paroxysmal atrial fibrillation (HCC) 10/10/2022   Secondary polycythemia 03/22/2021   Low testosterone in male 10/23/2020   Sarcoidosis 04/09/2020   Obesity (BMI 30-39.9) 12/04/2015   Chronic systolic heart failure (HCC) 08/28/2015   Frequent PVCs 08/28/2015   PAF (paroxysmal atrial fibrillation) (HCC) 08/28/2015   OSA (obstructive sleep apnea) 08/19/2015   Dilated aortic root (HCC) 05/11/2015   Depression 11/20/2010   HEART FAILURE 04/08/2010   FATIGUE 04/08/2010   Congestive heart failure (HCC) 02/08/2010   ATRIAL FLUTTER 02/07/2010   Tachycardia 01/23/2010   DYSPNEA 01/23/2010   ABNORMAL ECHOCARDIOGRAM 01/10/2009   CHEST PAIN UNSPECIFIED 11/21/2008   Essential hypertension 11/20/2008   PCP:  Kristian Covey, MD Pharmacy:   [phone] - [place], [place] - [address] [place] [address] Phone: [phone] Fax: [phone]  [facility] - [address] [place] [address] Phone: [phone] Fax: [phone]  [facility] - [place], [place] -  [phone] [facility] [address] Jerlyn Ly [address] Jerlyn Ly [place] [address] Phone: [phone] Fax: [phone]     Social Determinants of Health (SDOH) Social History: SDOH Screenings   Depression (PHQ2-9): Low Risk  (11/29/2021)  Tobacco Use: Low Risk  (10/14/2022)   SDOH Interventions:     Readmission Risk Interventions     No data to display

## 2022-10-15 NOTE — Progress Notes (Signed)
   10/15/22 0315  BiPAP/CPAP/SIPAP  BiPAP/CPAP/SIPAP Pt Type Adult  BiPAP/CPAP/SIPAP Resmed  Mask Type Full face mask  Mask Size Large  Respiratory Rate 11 breaths/min  EPAP 10 cmH2O  Patient Home Equipment No  Auto Titrate No  BiPAP/CPAP /SiPAP Vitals  Pulse Rate 95  Resp 11  SpO2 96 %  Bilateral Breath Sounds Clear  MEWS Score/Color  MEWS Score 2  MEWS Score Color Yellow

## 2022-10-15 NOTE — Telephone Encounter (Deleted)
Initiated prior authorization to [facility] for Tikosyn admission. Date of service: 10/28/2022 Pending authorization # [id number] Faxed clinical information to [facility] Fax - [phone]

## 2022-10-15 NOTE — Progress Notes (Signed)
Mobility Specialist Progress Note:   10/15/22 1526  Mobility  Activity Ambulated with assistance in hallway  Level of Assistance Standby assist, set-up cues, supervision of patient - no hands on  Assistive Device  (IV Pole)  Distance Ambulated (ft) 130 ft  Activity Response Tolerated well  Mobility Referral Yes  $Mobility charge 1 Mobility  Mobility Specialist Start Time (ACUTE ONLY) 1505  Mobility Specialist Stop Time (ACUTE ONLY) 1522  Mobility Specialist Time Calculation (min) (ACUTE ONLY) 17 min    Pre Mobility: 108 HR , 109/86 BP , 90% SpO2 RA During Mobility: 110 HR , 92% SpO2 RA  Post Mobility: 101 HR , 96% SpO2 RA  Pt received EOB, agreeable to mobility. C/o slight lightheadedness during ambulation, otherwise asymptomatic throughout. Pt returned to bed with call bell in reach and all needs met. Family present.   Leory Plowman  Mobility Specialist Please contact via [employer] office at [phone]

## 2022-10-15 NOTE — Progress Notes (Addendum)
Advanced Heart Failure Rounding Note  PCP-Cardiologist: Armanda Magic, MD   Subjective:    Readmitted with shock post cardioversion for A fib RVR. GDMT held. POCUS EF 15%.    Denies SOB.    Objective:   Weight Range: (!) 143.1 kg Body mass index is 37.4 kg/m.   Vital Signs:   Temp:  [97.9 F (36.6 C)-98.6 F (37 C)] 98.6 F (37 C) (08/07 0739) Pulse Rate:  [48-148] 90 (08/07 0739) Resp:  [0-38] 16 (08/07 0739) BP: (63-155)/(52-136) 95/83 (08/07 0739) SpO2:  [90 %-100 %] 96 % (08/07 0739) Weight:  [141.5 kg-143.1 kg] 143.1 kg (08/07 0443) Last BM Date : 10/14/22  Weight change: Filed Weights   10/14/22 2218 10/15/22 0443  Weight: (!) 141.5 kg (!) 143.1 kg    Intake/Output:  No intake or output data in the 24 hours ending 10/15/22 0919    Physical Exam    General:  No resp difficulty HEENT: Normal Neck: Supple. JVP 4-5. Carotids 2+ bilat; no bruits. No lymphadenopathy or thyromegaly appreciated. Cor: PMI nondisplaced. Irregular rate & rhythm. No rubs, gallops or murmurs. Lungs: Clear Abdomen: Soft, nontender, nondistended. No hepatosplenomegaly. No bruits or masses. Good bowel sounds. Extremities: No cyanosis, clubbing, rash, edema Neuro: Alert & orientedx3, cranial nerves grossly intact. moves all 4 extremities w/o difficulty. Affect pleasant   Telemetry   SR/ST 90-100s with occasional PVCs  EKG      Labs    CBC Recent Labs    10/14/22 1841  WBC 11.3*  NEUTROABS 6.6  HGB 19.4*  HCT 57.4*  MCV 86.2  PLT 191   Basic Metabolic Panel Recent Labs    81/19/14 1841 10/15/22 0126  NA 138 134*  K 4.1 4.4  CL 98 100  CO2 29 21*  GLUCOSE 75 114*  BUN 43* 41*  CREATININE 1.91* 1.90*  CALCIUM 8.9 8.5*  MG  --  2.3   Liver Function Tests Recent Labs    10/14/22 1841 10/15/22 0126  AST 28 28  ALT 30 34  ALKPHOS 52 51  BILITOT 0.9 0.8  PROT 6.5 6.3*  ALBUMIN 3.9 3.5   No results for input(s): "LIPASE", "AMYLASE" in the last 72  hours. Cardiac Enzymes No results for input(s): "CKTOTAL", "CKMB", "CKMBINDEX", "TROPONINI" in the last 72 hours.  BNP: BNP (last 3 results) Recent Labs    10/01/22 1020 10/15/22 0126  BNP 822.7* 777.2*    ProBNP (last 3 results) No results for input(s): "PROBNP" in the last 8760 hours.   D-Dimer No results for input(s): "DDIMER" in the last 72 hours. Hemoglobin A1C No results for input(s): "HGBA1C" in the last 72 hours. Fasting Lipid Panel No results for input(s): "CHOL", "HDL", "LDLCALC", "TRIG", "CHOLHDL", "LDLDIRECT" in the last 72 hours. Thyroid Function Tests No results for input(s): "TSH", "T4TOTAL", "T3FREE", "THYROIDAB" in the last 72 hours.  Invalid input(s): "FREET3"  Other results:   Imaging    [place] EKG SITE RITE  Result Date: 10/15/2022 If Site Rite image not attached, placement could not be confirmed due to current cardiac rhythm.  DG Chest Portable 1 View  Result Date: 10/14/2022 CLINICAL DATA:  Hypotension. EXAM: PORTABLE CHEST 1 VIEW COMPARISON:  Chest CT 09/26/2020 FINDINGS: The heart is enlarged but grossly stable. Costophrenic angles and left lung base not included in the field of view. Suggestion of pulmonary edema. No large pleural effusion. No pneumothorax. IMPRESSION: Cardiomegaly with suggestion of pulmonary edema. Electronically Signed   By: Ivette Loyal.D.  On: 10/14/2022 22:22     Medications:     Scheduled Medications:  apixaban  5 mg Oral BID   DULoxetine  20 mg Oral Daily   sodium chloride flush  3 mL Intravenous Q12H    Infusions:  sodium chloride      PRN Medications: sodium chloride, acetaminophen, ondansetron (ZOFRAN) IV, sodium chloride flush    Patient Profile  Harold Park is a 57 y.o. male with NICM with EF 30-35%, chronic systolic CHF, parox A-Flutter with RVR and HTN.   Admitted with shock following DCCV on 10/14/2022.   Assessment/Plan   1.  Shock, post cardioversion for A Fib RVR Recurrent A fib RVR  09/2022 and underwent successful cardioversion 7/25. Went back in A fib RVR and had cardioversion in the ED. Post cardioversion developed shock.   He has been followed by EP and was seen 10/10/22 Tikosyn was being considered so Ranolazinen was stopped and Coreg was increased. Will ask EP to consult Previously intolerant Amiodarone due to nausea/vomiting. He is willing to consider.   - Dr Gala Romney discussed with him and he is willing to start amio drip.  -Coreg has been held due to possible low output.  - Continue apixaban 5 mg twice a day.   2. Chronic HFrEF, NICM EF has been ~ 30-40% dating back to 2016. Had CMRI 2017 LVEF 20% - Had cardiac pet 2023- no evidence for cardiac sarcoid or inflammatory. LVEF 38%.   - Repeat ECHO today.  EF now appears 20-25%.  - Holding GDMT for now.  - Place PICC and check CO-OX/CVP. Volume status looks elevated but will verify with PICC.  - If CO-OX low will need to add milrinone.   3. PVCs As above holding bb  4. OSA   5. CKD Stage III Recent creatinine 1.8 the end of July but previously creatinine baseline ~ 1.2.  Follow renal function.     Length of Stay: 0  Tonye Becket, NP  10/15/2022, 9:19 AM  Advanced Heart Failure Team Pager [phone] (M-F; 7a - 5p)  Please contact [facility] for night-coverage after hours (5p -7a ) and weekends on [email or web address]  Patient seen and examined with the above-signed Advanced Practice Provider and/or Housestaff. I personally reviewed laboratory data, imaging studies and relevant notes. I independently examined the patient and formulated the important aspects of the plan. I have edited the note to reflect any of my changes or salient points. I have personally discussed the plan with the patient and/or family.  He remains in NSR. Feels better but still weak.   Echo this am EF 25-30% RV moderately down.   Pulse pressure low.  General:  Lying in bed No resp difficulty HEENT: normal Neck: supple. JVP to jaw. Carotids  2+ bilat; no bruits. No lymphadenopathy or thryomegaly appreciated. Cor: PMI nondisplaced. Regular rate & rhythm. No rubs, gallops or murmurs. Lungs: clear Abdomen: obese soft, nontender, nondistended. No hepatosplenomegaly. No bruits or masses. Good bowel sounds. Extremities: no cyanosis, clubbing, rash, edema Neuro: alert & orientedx3, cranial nerves grossly intact. moves all 4 extremities w/o difficulty. Affect pleasant  Remains tenuous with narrow pulse pressure in setting of low EF and recent DC-CV  Suspect component of tachy-induced CM but may also have underlying DCM as well.   Will need to maintain NSR in hopes that EF will improve. Long discussion about Tikosyn vs amio. I would prefer to rechallenge with amio rather than proceed with tikosyn given risk/benefit ration. Previously had nausea with oral amio.  They agree.   Will start IV amio and contact EP about possible ablation down the road.   Place PICC to more closely follow volume status and output.   Arvilla Meres, MD  12:31 PM

## 2022-10-15 NOTE — Consult Note (Signed)
Cardiology Consultation   Patient ID: Harold Park MRN: [id number]; DOB: Aug 28, 1965  Admit date: 10/14/2022 Date of Consult: 10/15/2022  PCP:  Kristian Covey, MD   [facility] Providers Cardiologist:  Armanda Magic, MD  Sleep Medicine:  Armanda Magic, MD  AHF: Dr. Gala Romney   Patient Profile:   Harold Park is a 57 y.o. male with a hx of NICM, PVCs (remotely by monitor w/burden of 6.3% in 2017), OSA w/CPAP, pulmonary sarcoid, p.HTN, HTN, AFib/aflutter, who is being seen 10/15/2022 for the evaluation of recurrent Afib and worsened LVEF at the request of Dr. Gala Romney.  Cath 2011 with normal coronaries Dec 2021Coronary CT ca score zero  Poor coronary images with motion artifact. I suspect that there is no significant coronary disease, but cannot be definitive due to low study quality.  C.MRI 2014 Impression:  1)    Moderat LVE with mild LVH Diffuse hypokinesis EF 34%       2)    No infarct or hyperenhancement  3)    Mild RVE  Findings consistent with non ischemic cardiomyopathy   C.MRI March 2017 Impressions:  - Dilated aorta particularly at the sinuses of Valsalva as is  conventionally measured, but when indexed for body surface area  qualitatively normal in size  - Severely dilated left ventricle with severely reduced systolic function  (LVEF = 20%)  - Dilated left and right atria  - Right ventricular volume increased but likely normal when indexed to  body surface area with low normal right ventricular systolic function     LVEF waxed/waned over the years 20-30% <> 40-45% <> 35-40%  CT chest 03/06/20 to evaluate hilar fullness seen on CXR. Interval development of numerous enlarged mediastinal and bilateral hilar lymph nodes  Underwent bronchoscopy with biopsy of mediastinal nodes. 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 Treated with prednisone.   PET 08/08/2021   1. There is no abnormal metabolism to indicate the presence of an active inflammatory process in  the left ventricular        myocardium.   2. There is no abnormal metabolism in the right ventricle.   3. There is no evidence to suggest active cardiac sarcoidosis or other inflammatory process.    Perfusion/Function:   Gated cardiac PET/CT rest myocardial perfusion study with [id number] demonstrates:   1. Abnormal apical myocardial perfusion.   2. Abnormal left ventricular systolic function, wall motion, apical thickening, and dilated LV.    Non-diagnostic CT obtained for PET attenuation correction:   1. There are no discernible coronary artery calcifications.   2. There are areas of extracardiac hypermetabolic activity noted on the limited field of view.This includes mediastinum and       hilar lymphnodes. This could represent extra cardiac sarcoidosis or other process.If clinically concern a diagnostic whole       body PET/CT may be obtained for further evaluation.   AFib/PVCs 2011 Amiodarone poorly tolerated w/nausea (I also see some reports of vertigo-like sx ) 2017 started Ranexa (for PVCs)  History of Present Illness:   Harold Park as far as I can find, evaluated by EP service back on 2017 by Dr. Graciela Husbands for discussion of ICD for primary prevention, though deferred with some w/u still in progress. He has had a couple touches in the AFib clinic with mention of amiodarone intoleance historically (rx for PVCs) 2/2 nausea.  Most recently struggling with recurrent Afib > DCCV 10/02/22 > back in the ER with AFib and DCCV in the ER yesterday and  admitted for further management. Planned for amiodarone gtt given his LVEF now is back down to the 20's and reduced RV function as well, with suspect AFib/RVR driving the new reduction  There is some mention sthat in the past has not been felt a good candidate for ablation, unclear when/why He saw the AFib clinic 10/10/22, his ranexa stopped with consideration for Tikosyn   Home meds (rate/rhythm) Coreg 12.5mg  BID Ranexa 500mg  BID > recently  held  [facility]: Eliquis    LABS K+ 4.4 Mag 2.3 BUN/Creat 41/1.90 (baseline about 1-1.2) BNP 777.2 WBC 11/3 H/H 19/57 Plts 191   He tells me that for the last 5 years or so, his AFib has not been a primary issue for him, getting palpitations/feeling some Afib hear and there lasting a couple hours, has not hadto seek care for it specifically for years until the last few weeks or so. He feels the palpitations, makes him feel weak and tired.   He reports good medication compliance. The only change he can think of is that in the last 72mo he has lost 50lbs or so with [facility] though with changing coverage has had a change in this to Zepbound, fairly recently has switched to a compound pharmacy for better cost  Otherwise no clear trigger for his recent flurry or increased burden of his AFib   Past Medical History:  Diagnosis Date   Chronic bronchitis    HTN (hypertension)    NICM (nonischemic cardiomyopathy) (HCC)    Obesity (BMI 30-39.9) 12/04/2015   OSA (obstructive sleep apnea) 08/19/2015   Severe with AHI 64/hr now on CPAP at 13cm H2O   PAF (paroxysmal atrial fibrillation) (HCC)    Systolic CHF, chronic (HCC)     Past Surgical History:  Procedure Laterality Date   APPENDECTOMY     BRONCHIAL WASHINGS  04/24/2020   Procedure: BRONCHIAL WASHINGS;  Surgeon: Leslye Peer, MD;  Location: [facility] ENDOSCOPY;  Service: Pulmonary;;   CARDIAC CATHETERIZATION     CARDIOVERSION N/A 06/19/2017   Procedure: CARDIOVERSION;  Surgeon: Dolores Patty, MD;  Location: [facility] ENDOSCOPY;  Service: Cardiovascular;  Laterality: N/A;   CARDIOVERSION N/A 10/02/2022   Procedure: CARDIOVERSION;  Surgeon: Dolores Patty, MD;  Location: [facility] INVASIVE CV LAB;  Service: Cardiovascular;  Laterality: N/A;   FINE NEEDLE ASPIRATION  04/24/2020   Procedure: FINE NEEDLE ASPIRATION (FNA) LINEAR;  Surgeon: Leslye Peer, MD;  Location: [facility] ENDOSCOPY;  Service: Pulmonary;;   LIPOMA RESECTION     arms   SPLENECTOMY      SPLIT NIGHT STUDY  08/12/2015   VIDEO BRONCHOSCOPY WITH ENDOBRONCHIAL ULTRASOUND N/A 04/24/2020   Procedure: VIDEO BRONCHOSCOPY WITH ENDOBRONCHIAL ULTRASOUND;  Surgeon: Leslye Peer, MD;  Location[facility] ENDOSCOPY;  Service: Pulmonary;  Laterality: N/A;     Home Medications:  Prior to Admission medications   Medication Sig Start Date End Date Taking? Authorizing Provider  carvedilol (COREG) 25 MG tablet Take 1 tablet (25 mg total) by mouth daily in the afternoon. Patient taking differently: Take 12.5 mg by mouth 2 (two) times daily with a meal. 09/05/22  Yes [name], Elberta Fortis, MD  DULoxetine (CYMBALTA) 20 MG capsule TAKE 1 CAPSULE BY MOUTH EVERY DAY 11/12/21  Yes Bensimhon, Bevelyn Buckles, MD  ELIQUIS 5 MG TABS tablet TAKE 1 TABLET BY MOUTH TWICE A DAY 10/10/22  Yes Bensimhon, Bevelyn Buckles, MD  potassium chloride SA (KLOR-CON M20) 20 MEQ tablet TAKE 4 TABLETS BY MOUTH EVERY MORNING AND 2 TABLETS DAILY W/ LUNCH  AND 4 TABS EVERY EVENING. Patient taking differently: TAKE 4 TABLETS BY MOUTH EVERY MORNING AND 2 TABLETS DAILY W/ LUNCH AND 4 TABS EVERY EVENING.  Patient states he will start back taking his Potassium- Taking 1 tablet by mouth daily 02/11/22  Yes Bensimhon, Bevelyn Buckles, MD  ranolazine (RANEXA) 500 MG 12 hr tablet TAKE 1 TABLET BY MOUTH TWICE A DAY 10/10/22  Yes Bensimhon, Bevelyn Buckles, MD  sacubitril-valsartan (ENTRESTO) 49-51 MG Take 1 tablet by mouth 2 (two) times daily. 10/14/22  Yes Clegg, Amy D, NP  Semaglutide-Weight Management (WEGOVY) 2.4 MG/0.75ML SOAJ Inject 2.4 mg into the skin once a week.   Yes [provider]  torsemide (DEMADEX) 20 MG tablet Take 0.5 tablets (10 mg total) by mouth 2 (two) times daily. 10/01/22  Yes Clegg, Amy D, NP  albuterol (VENTOLIN HFA) 108 (90 Base) MCG/ACT inhaler TAKE 2 PUFFS BY MOUTH EVERY 6 HOURS AS NEEDED FOR WHEEZE OR SHORTNESS OF BREATH 02/24/22   [name], Les Pou, MD  methocarbamol (ROBAXIN) 500 MG tablet Take 500 mg by mouth 2 (two) times daily as needed for  muscle spasms.    [provider]  NEEDLE, DISP, 20 G (BD DISP NEEDLES) 20G X 1-1/2" MISC Use as directed 11/29/21   [name], Elberta Fortis, MD  NEEDLE, DISP, 22 G 22G X 1" MISC Use to inject testosterone into the muscle once every 14 days. 10/19/20   [name], Elberta Fortis, MD  NON FORMULARY once a week. Zepbound  Compound    [provider]  sildenafil (VIAGRA) 100 MG tablet TAKE 1 TABLET BY MOUTH EVERY DAY AS NEEDED FOR ERECTILE DYSFUNCTION 10/22/21   Bensimhon, Bevelyn Buckles, MD  testosterone cypionate (DEPOTESTOSTERONE CYPIONATE) 200 MG/ML injection Inject 1.5 mLs (300 mg total) into the muscle every 14 (fourteen) days. 11/29/21   [name], Elberta Fortis, MD    Inpatient Medications: Scheduled Meds:  amiodarone  150 mg Intravenous Once   apixaban  5 mg Oral BID   DULoxetine  20 mg Oral Daily   sodium chloride flush  3 mL Intravenous Q12H   Continuous Infusions:  sodium chloride     amiodarone     Followed by   amiodarone     PRN Meds: sodium chloride, acetaminophen, ondansetron (ZOFRAN) IV, perflutren lipid microspheres (DEFINITY) IV suspension, sodium chloride flush  Allergies:    Allergies  Allergen Reactions   Bee Venom Anaphylaxis   Amiodarone Nausea Only   Spironolactone     Social History:   Social History   Socioeconomic History   Marital status: Married    Spouse name: Not on file   Number of children: 3   Years of education: Not on file   Highest education level: Not on file  Occupational History   Occupation: realtor  Tobacco Use   Smoking status: Never   Smokeless tobacco: Never  Vaping Use   Vaping status: Never Used  Substance and Sexual Activity   Alcohol use: Yes    Comment: occasional   Drug use: Never   Sexual activity: Not on file  Other Topics Concern   Not on file  Social History Narrative   Not on file   Social Determinants of Health   Financial Resource Strain: Not on file  Food Insecurity: Not on file  Transportation Needs: Not on  file  Physical Activity: Not on file  Stress: Not on file  Social Connections: Not on file  Intimate Partner Violence: Not on file    Family History:  Family History  Problem Relation Age of Onset   Breast cancer Mother    Cancer Father        blood cancer   Hypertension Other    Heart disease Other    Stomach cancer Neg Hx    Colon cancer Neg Hx    Esophageal cancer Neg Hx    Pancreatic cancer Neg Hx    Rectal cancer Neg Hx      ROS:  Please see the history of present illness.  All other ROS reviewed and negative.     Physical Exam/Data:   Vitals:   10/15/22 0100 10/15/22 0315 10/15/22 0443 10/15/22 0739  BP:    95/83  Pulse:  95 98 90  Resp: 20 11 (!) 21 16  Temp: 98 F (36.7 C)   98.6 F (37 C)  TempSrc: Oral   Oral  SpO2:  96% 92% 96%  Weight:   (!) 143.1 kg   Height:       No intake or output data in the 24 hours ending 10/15/22 1054    10/15/2022    4:43 AM 10/14/2022   10:18 PM 10/10/2022    2:07 PM  Last 3 Weights  Weight (lbs) 315 lb 6.4 oz 312 lb 313 lb 3.2 oz  Weight (kg) 143.065 kg 141.522 kg 142.067 kg     Body mass index is 37.4 kg/m.  General:  Well nourished, well developed, in no acute distress HEENT: normal Neck: no JVD Vascular: No carotid bruits; Distal pulses 2+ bilaterally Cardiac:  RRR; no murmurs, rubs, gallops Lungs:  clear to auscultation bilaterally, no wheezing, rhonchi or rales  Abd: soft, nontender, no hepatomegaly  Ext: no edema Musculoskeletal:  No deformities Skin: warm and dry  Neuro:  no focal abnormalities noted Psych:  Normal affect   EKG:  The EKG was personally reviewed and demonstrates:    coarse AFib/AFlutter 135bpm, poor R progression, IVCD AFib 149bpm SR 99bpm, 1st degree AVblock 22ms, LAD, IVCD Today is SR 92bpm, 1st degree AVBlock , IVCD, LAD, PVCs  OLD 10/02/22: SR 97bpm, 1st degree AVblock , LAD, IVCD , PVC    Telemetry:  Telemetry was personally reviewed and demonstrates:     SR 80's-90's, frequent PVCs (8-15% by tele), at least 2 morphologies, rare and brief PATs    Relevant CV Studies:  10/15/22: TTE 1. Left ventricular ejection fraction, by estimation, is 25 to 30%. The  left ventricle has severely decreased function. The left ventricle  demonstrates global hypokinesis. The left ventricular internal cavity size  was severely dilated. Left ventricular  diastolic function could not be evaluated.   2. Right ventricular systolic function is moderately reduced. The right  ventricular size is moderately enlarged. Tricuspid regurgitation signal is  inadequate for assessing PA pressure.   3. The mitral valve is normal in structure. Trivial mitral valve  regurgitation. No evidence of mitral stenosis.   4. The aortic valve is normal in structure. Aortic valve regurgitation is  not visualized. No aortic stenosis is present.   5. The inferior vena cava is normal in size with <50% respiratory  variability, suggesting right atrial pressure of 8 mmHg.   6. Ascending aorta and aortic root measurements are within normal limits  for age when indexed to body surface area.   7. Right atrial size was mildly dilated.   8. Left atrial size was moderately dilated.   Laboratory Data:  High Sensitivity Troponin:  No results for input(s): "TROPONINIHS" in the  last 720 hours.   Chemistry Recent Labs  Lab 10/14/22 1841 10/15/22 0126  NA 138 134*  K 4.1 4.4  CL 98 100  CO2 29 21*  GLUCOSE 75 114*  BUN 43* 41*  CREATININE 1.91* 1.90*  CALCIUM 8.9 8.5*  MG  --  2.3  GFRNONAA 40* 40*  ANIONGAP 11 13    Recent Labs  Lab 10/14/22 1841 10/15/22 0126  PROT 6.5 6.3*  ALBUMIN 3.9 3.5  AST 28 28  ALT 30 34  ALKPHOS 52 51  BILITOT 0.9 0.8   Lipids No results for input(s): "CHOL", "TRIG", "HDL", "LABVLDL", "LDLCALC", "CHOLHDL" in the last 168 hours.  Hematology Recent Labs  Lab 10/14/22 1841  WBC 11.3*  RBC 6.66*  HGB 19.4*  HCT 57.4*  MCV 86.2  MCH 29.1   MCHC 33.8  RDW 17.5*  PLT 191   Thyroid No results for input(s): "TSH", "FREET4" in the last 168 hours.  BNP Recent Labs  Lab 10/15/22 0126  BNP 777.2*    DDimer No results for input(s): "DDIMER" in the last 168 hours.   Radiology/Studies:    DG Chest Portable 1 View  Result Date: 10/14/2022 CLINICAL DATA:  Hypotension. EXAM: PORTABLE CHEST 1 VIEW COMPARISON:  Chest CT 09/26/2020 FINDINGS: The heart is enlarged but grossly stable. Costophrenic angles and left lung base not included in the field of view. Suggestion of pulmonary edema. No large pleural effusion. No pneumothorax. IMPRESSION: Cardiomegaly with suggestion of pulmonary edema. Electronically Signed   By: Narda Rutherford M.D.   On: 10/14/2022 22:22     Assessment and Plan:   AFib, RVR Hx of AFlutter CHA2DS2Vasc is 2 (CM/HTN) On Eliquis  3. PVCs  4. NICM Unknown etiology Perhaps PVCs, AFib, both Neg cardiac PET for cardiac sarcoid or inflammatory process June 2023 did mention abnormal apical myocardial perfusion Neg c.MRI (2014) (?2017) No CAD by cath (2011 by chart notes), Dec 2021 Coronary CT (though low quality, ca was zero and low suspicion of obstructive CAD)  Unfortunately at least remotely poorly tolerated amiodarone, though this would be the better AAD for him with both AF and high PVC burden Hopefully he can tolerate it this go-around. Unclear what PO dose he had been on previously  EP MD will see later today  Care otherwise as per attending team     Risk Assessment/Risk Scores:    For questions or updates, please contact [facility] Please consult [email or web address] for contact info under    Signed, Sheilah Pigeon, PA-C  10/15/2022 10:54 AM]

## 2022-10-15 NOTE — Progress Notes (Signed)
  Echocardiogram 2D Echocardiogram has been performed.  Leda Roys RDCS 10/15/2022, 10:36 AM

## 2022-10-16 DIAGNOSIS — Z1152 Encounter for screening for COVID-19: Secondary | ICD-10-CM | POA: Diagnosis not present

## 2022-10-16 DIAGNOSIS — R579 Shock, unspecified: Secondary | ICD-10-CM | POA: Diagnosis not present

## 2022-10-16 DIAGNOSIS — E1165 Type 2 diabetes mellitus with hyperglycemia: Secondary | ICD-10-CM | POA: Diagnosis present

## 2022-10-16 DIAGNOSIS — G4733 Obstructive sleep apnea (adult) (pediatric): Secondary | ICD-10-CM | POA: Diagnosis not present

## 2022-10-16 DIAGNOSIS — I9581 Postprocedural hypotension: Secondary | ICD-10-CM | POA: Diagnosis not present

## 2022-10-16 DIAGNOSIS — I4891 Unspecified atrial fibrillation: Secondary | ICD-10-CM | POA: Diagnosis not present

## 2022-10-16 DIAGNOSIS — R0989 Other specified symptoms and signs involving the circulatory and respiratory systems: Secondary | ICD-10-CM | POA: Diagnosis not present

## 2022-10-16 DIAGNOSIS — I4892 Unspecified atrial flutter: Secondary | ICD-10-CM | POA: Diagnosis present

## 2022-10-16 DIAGNOSIS — I5023 Acute on chronic systolic (congestive) heart failure: Secondary | ICD-10-CM | POA: Diagnosis present

## 2022-10-16 DIAGNOSIS — I428 Other cardiomyopathies: Secondary | ICD-10-CM | POA: Diagnosis present

## 2022-10-16 DIAGNOSIS — N179 Acute kidney failure, unspecified: Secondary | ICD-10-CM | POA: Diagnosis present

## 2022-10-16 DIAGNOSIS — E1122 Type 2 diabetes mellitus with diabetic chronic kidney disease: Secondary | ICD-10-CM | POA: Diagnosis present

## 2022-10-16 DIAGNOSIS — R570 Cardiogenic shock: Secondary | ICD-10-CM | POA: Diagnosis present

## 2022-10-16 DIAGNOSIS — R0602 Shortness of breath: Secondary | ICD-10-CM | POA: Diagnosis not present

## 2022-10-16 DIAGNOSIS — I130 Hypertensive heart and chronic kidney disease with heart failure and stage 1 through stage 4 chronic kidney disease, or unspecified chronic kidney disease: Secondary | ICD-10-CM | POA: Diagnosis present

## 2022-10-16 DIAGNOSIS — E669 Obesity, unspecified: Secondary | ICD-10-CM | POA: Diagnosis present

## 2022-10-16 DIAGNOSIS — E871 Hypo-osmolality and hyponatremia: Secondary | ICD-10-CM | POA: Diagnosis not present

## 2022-10-16 DIAGNOSIS — J9601 Acute respiratory failure with hypoxia: Secondary | ICD-10-CM | POA: Diagnosis present

## 2022-10-16 DIAGNOSIS — I472 Ventricular tachycardia, unspecified: Secondary | ICD-10-CM | POA: Diagnosis present

## 2022-10-16 DIAGNOSIS — I4819 Other persistent atrial fibrillation: Secondary | ICD-10-CM | POA: Diagnosis present

## 2022-10-16 DIAGNOSIS — N183 Chronic kidney disease, stage 3 unspecified: Secondary | ICD-10-CM | POA: Diagnosis not present

## 2022-10-16 DIAGNOSIS — D751 Secondary polycythemia: Secondary | ICD-10-CM | POA: Diagnosis present

## 2022-10-16 DIAGNOSIS — I493 Ventricular premature depolarization: Secondary | ICD-10-CM | POA: Diagnosis present

## 2022-10-16 DIAGNOSIS — I5022 Chronic systolic (congestive) heart failure: Secondary | ICD-10-CM | POA: Diagnosis not present

## 2022-10-16 DIAGNOSIS — Z452 Encounter for adjustment and management of vascular access device: Secondary | ICD-10-CM | POA: Diagnosis not present

## 2022-10-16 DIAGNOSIS — E8770 Fluid overload, unspecified: Secondary | ICD-10-CM | POA: Diagnosis not present

## 2022-10-16 DIAGNOSIS — I517 Cardiomegaly: Secondary | ICD-10-CM | POA: Diagnosis not present

## 2022-10-16 DIAGNOSIS — I110 Hypertensive heart disease with heart failure: Secondary | ICD-10-CM | POA: Diagnosis not present

## 2022-10-16 DIAGNOSIS — J189 Pneumonia, unspecified organism: Secondary | ICD-10-CM | POA: Diagnosis present

## 2022-10-16 DIAGNOSIS — J984 Other disorders of lung: Secondary | ICD-10-CM | POA: Diagnosis not present

## 2022-10-16 DIAGNOSIS — D6869 Other thrombophilia: Secondary | ICD-10-CM | POA: Diagnosis present

## 2022-10-16 DIAGNOSIS — E875 Hyperkalemia: Secondary | ICD-10-CM | POA: Diagnosis not present

## 2022-10-16 DIAGNOSIS — N1832 Chronic kidney disease, stage 3b: Secondary | ICD-10-CM | POA: Diagnosis present

## 2022-10-16 DIAGNOSIS — I483 Typical atrial flutter: Secondary | ICD-10-CM | POA: Diagnosis not present

## 2022-10-16 DIAGNOSIS — I484 Atypical atrial flutter: Secondary | ICD-10-CM | POA: Diagnosis present

## 2022-10-16 DIAGNOSIS — I5021 Acute systolic (congestive) heart failure: Secondary | ICD-10-CM | POA: Diagnosis not present

## 2022-10-16 DIAGNOSIS — I071 Rheumatic tricuspid insufficiency: Secondary | ICD-10-CM | POA: Diagnosis present

## 2022-10-16 DIAGNOSIS — R918 Other nonspecific abnormal finding of lung field: Secondary | ICD-10-CM | POA: Diagnosis not present

## 2022-10-16 DIAGNOSIS — I480 Paroxysmal atrial fibrillation: Secondary | ICD-10-CM | POA: Diagnosis not present

## 2022-10-16 DIAGNOSIS — I959 Hypotension, unspecified: Secondary | ICD-10-CM | POA: Diagnosis present

## 2022-10-16 LAB — COOXEMETRY PANEL
Carboxyhemoglobin: 1.8 % — ABNORMAL HIGH (ref 0.5–1.5)
Carboxyhemoglobin: 1.2 % (ref 0.5–1.5)
Methemoglobin: <0.7 % (ref 0.0–1.5)
Methemoglobin: 1.1 % (ref 0.0–1.5)
O2 Saturation: 46.9 %
O2 Saturation: 64.5 %
Total hemoglobin: 18.8 g/dL — ABNORMAL HIGH (ref 12.0–16.0)
Total hemoglobin: 16.7 g/dL — ABNORMAL HIGH (ref 12.0–16.0)

## 2022-10-16 MED ORDER — POTASSIUM CHLORIDE CRYS ER 20 MEQ PO TBCR
40.0000 meq | EXTENDED_RELEASE_TABLET | ORAL | Status: AC
  Administered 2022-10-16 (×2): 40 meq via ORAL
  Filled 2022-10-16 (×2): qty 2

## 2022-10-16 MED ORDER — DIGOXIN 125 MCG PO TABS
0.1250 mg | ORAL_TABLET | Freq: Every day | ORAL | Status: DC
  Administered 2022-10-16 – 2022-10-17 (×2): 0.125 mg via ORAL
  Filled 2022-10-16 (×2): qty 1

## 2022-10-16 MED ORDER — FUROSEMIDE 10 MG/ML IJ SOLN
80.0000 mg | Freq: Once | INTRAMUSCULAR | Status: AC
  Administered 2022-10-16: 80 mg via INTRAVENOUS
  Filled 2022-10-16: qty 8

## 2022-10-16 MED ORDER — SCOPOLAMINE 1 MG/3DAYS TD PT72
1.0000 | MEDICATED_PATCH | TRANSDERMAL | Status: DC
  Administered 2022-10-16 – 2022-10-22 (×3): 1.5 mg via TRANSDERMAL
  Filled 2022-10-16 (×4): qty 1

## 2022-10-16 MED ORDER — EPLERENONE 25 MG PO TABS
25.0000 mg | ORAL_TABLET | Freq: Every day | ORAL | Status: DC
  Administered 2022-10-16 – 2022-10-17 (×2): 25 mg via ORAL
  Filled 2022-10-16 (×3): qty 1

## 2022-10-16 MED ORDER — AMIODARONE LOAD VIA INFUSION
150.0000 mg | Freq: Once | INTRAVENOUS | Status: AC
  Administered 2022-10-16: 150 mg via INTRAVENOUS

## 2022-10-16 NOTE — Progress Notes (Addendum)
Advanced Heart Failure Rounding Note  PCP-Cardiologist: Armanda Magic, MD   Subjective:    Readmitted with shock post cardioversion for A fib RVR. GDMT held. POCUS EF 15%. Loading amio drip.   CO-OX 47%. Repeat now.   Having some nausea with amio drip. Tolerating with zofran. Overnight had some shortness of breath.     Objective:   Weight Range: (!) 142.3 kg Body mass index is 37.2 kg/m.   Vital Signs:   Temp:  [97.1 F (36.2 C)-99.4 F (37.4 C)] 97.1 F (36.2 C) (08/08 0825) Pulse Rate:  [93-108] 94 (08/08 0825) Resp:  [20-21] 21 (08/08 0825) BP: (89-112)/(79-90) 102/83 (08/08 0825) SpO2:  [90 %-95 %] 95 % (08/08 0825) Weight:  [142.3 kg] 142.3 kg (08/08 0500) Last BM Date : 10/14/22  Weight change: Filed Weights   10/14/22 2218 10/15/22 0443 10/16/22 0500  Weight: (!) 141.5 kg (!) 143.1 kg (!) 142.3 kg    Intake/Output:   Intake/Output Summary (Last 24 hours) at 10/16/2022 0832 Last data filed at 10/16/2022 0825 Gross per 24 hour  Intake 1188.94 ml  Output 1100 ml  Net 88.94 ml      Physical Exam    General:  No resp difficulty HEENT: normal Neck: supple. JVP 11-12  Carotids 2+ bilat; no bruits. No lymphadenopathy or thryomegaly appreciated. Cor: PMI nondisplaced. Regular rate & rhythm. No rubs, gallops or murmurs. Lungs: clear Abdomen: soft, nontender, nondistended. No hepatosplenomegaly. No bruits or masses. Good bowel sounds. Extremities: no cyanosis, clubbing, rash, R and LLE trace edema  RUE PICC Neuro: alert & orientedx3, cranial nerves grossly intact. moves all 4 extremities w/o difficulty. Affect pleasant   Telemetry  SR -ST with occasional PVCs   EKG      Labs    CBC Recent Labs    10/14/22 1841  WBC 11.3*  NEUTROABS 6.6  HGB 19.4*  HCT 57.4*  MCV 86.2  PLT 191   Basic Metabolic Panel Recent Labs    16/10/96 0126 10/16/22 0428  NA 134* 134*  K 4.4 3.9  CL 100 101  CO2 21* 24  GLUCOSE 114* 111*  BUN 41* 35*   CREATININE 1.90* 1.67*  CALCIUM 8.5* 8.6*  MG 2.3 2.3   Liver Function Tests Recent Labs    10/15/22 0126 10/16/22 0428  AST 28 22  ALT 34 28  ALKPHOS 51 40  BILITOT 0.8 1.1  PROT 6.3* 5.8*  ALBUMIN 3.5 3.2*   No results for input(s): "LIPASE", "AMYLASE" in the last 72 hours. Cardiac Enzymes No results for input(s): "CKTOTAL", "CKMB", "CKMBINDEX", "TROPONINI" in the last 72 hours.  BNP: BNP (last 3 results) Recent Labs    10/01/22 1020 10/15/22 0126  BNP 822.7* 777.2*    ProBNP (last 3 results) No results for input(s): "PROBNP" in the last 8760 hours.   D-Dimer No results for input(s): "DDIMER" in the last 72 hours. Hemoglobin A1C No results for input(s): "HGBA1C" in the last 72 hours. Fasting Lipid Panel No results for input(s): "CHOL", "HDL", "LDLCALC", "TRIG", "CHOLHDL", "LDLDIRECT" in the last 72 hours. Thyroid Function Tests Recent Labs    10/16/22 0428  TSH 3.311    Other results:   Imaging    ECHOCARDIOGRAM COMPLETE  Result Date: 10/15/2022    ECHOCARDIOGRAM REPORT   Patient Name:   Sriharsha Mcmaken Date of Exam: 10/15/2022 Medical Rec #:  [id number]       Height:       77.0 in Accession #:  [id number]      Weight:       315.4 lb Date of Birth:  Sep 14, 1965       BSA:          2.715 m Patient Age:    57 years        BP:           99/85 mmHg Patient Gender: M               HR:           96 bpm. Exam Location:  Inpatient Procedure: 2D Echo, Cardiac Doppler and Color Doppler Indications:    I50.21 Acute systolic (congestive) heart failure  History:        Patient has prior history of Echocardiogram examinations, most                 recent 06/13/2021. CHF and Cardiomyopathy, Arrythmias:Atrial                 Fibrillation; Risk Factors:Hypertension.  Sonographer:    Harriette Bouillon RDCS Referring Phys: Simonne Come GOODWIN IMPRESSIONS  1. Left ventricular ejection fraction, by estimation, is 25 to 30%. The left ventricle has severely decreased function. The left  ventricle demonstrates global hypokinesis. The left ventricular internal cavity size was severely dilated. Left ventricular diastolic function could not be evaluated.  2. Right ventricular systolic function is moderately reduced. The right ventricular size is moderately enlarged. Tricuspid regurgitation signal is inadequate for assessing PA pressure.  3. The mitral valve is normal in structure. Trivial mitral valve regurgitation. No evidence of mitral stenosis.  4. The aortic valve is normal in structure. Aortic valve regurgitation is not visualized. No aortic stenosis is present.  5. The inferior vena cava is normal in size with <50% respiratory variability, suggesting right atrial pressure of 8 mmHg.  6. Ascending aorta and aortic root measurements are within normal limits for age when indexed to body surface area.  7. Right atrial size was mildly dilated.  8. Left atrial size was moderately dilated. FINDINGS  Left Ventricle: Left ventricular ejection fraction, by estimation, is 25 to 30%. The left ventricle has severely decreased function. The left ventricle demonstrates global hypokinesis. Definity contrast agent was given IV to delineate the left ventricular endocardial borders. The left ventricular internal cavity size was severely dilated. There is no left ventricular hypertrophy. Abnormal (paradoxical) septal motion, consistent with left bundle branch block. Left ventricular diastolic function  could not be evaluated. Right Ventricle: The right ventricular size is moderately enlarged. No increase in right ventricular wall thickness. Right ventricular systolic function is moderately reduced. Tricuspid regurgitation signal is inadequate for assessing PA pressure. Left Atrium: Left atrial size was moderately dilated. Right Atrium: Right atrial size was mildly dilated. Pericardium: There is no evidence of pericardial effusion. Mitral Valve: The mitral valve is normal in structure. Trivial mitral valve  regurgitation. No evidence of mitral valve stenosis. Tricuspid Valve: The tricuspid valve is normal in structure. Tricuspid valve regurgitation is not demonstrated. No evidence of tricuspid stenosis. Aortic Valve: The aortic valve is normal in structure. Aortic valve regurgitation is not visualized. No aortic stenosis is present. Pulmonic Valve: The pulmonic valve was normal in structure. Pulmonic valve regurgitation is trivial. No evidence of pulmonic stenosis. Aorta: The aortic root is normal in size and structure. Ascending aorta measurements are within normal limits for age when indexed to body surface area. Venous: The inferior vena cava is normal in size with less than 50% respiratory  variability, suggesting right atrial pressure of 8 mmHg. IAS/Shunts: No atrial level shunt detected by color flow Doppler.  LEFT VENTRICLE PLAX 2D LVIDd:         7.10 cm LVIDs:         6.00 cm LV PW:         0.90 cm LV IVS:        0.80 cm LVOT diam:     2.50 cm LV SV:         37 LV SV Index:   14 LVOT Area:     4.91 cm  RIGHT VENTRICLE             IVC RV S prime:     11.30 cm/s  IVC diam: 2.00 cm TAPSE (M-mode): 2.4 cm LEFT ATRIUM              Index        RIGHT ATRIUM           Index LA diam:        5.20 cm  1.92 cm/m   RA Area:     25.80 cm LA Vol (A2C):   107.0 ml 39.42 ml/m  RA Volume:   90.60 ml  33.37 ml/m LA Vol (A4C):   100.0 ml 36.84 ml/m LA Biplane Vol: 110.0 ml 40.52 ml/m  AORTIC VALVE             PULMONIC VALVE LVOT Vmax:   53.30 cm/s  PV Vmax:       0.41 m/s LVOT Vmean:  33.200 cm/s PV Peak grad:  0.7 mmHg LVOT VTI:    0.076 m  AORTA Ao Root diam: 4.10 cm Ao Asc diam:  3.90 cm  SHUNTS Systemic VTI:  0.08 m Systemic Diam: 2.50 cm Armanda Magic MD Electronically signed by Armanda Magic MD Signature Date/Time: 10/15/2022/11:15:57 AM    Final      Medications:     Scheduled Medications:  apixaban  5 mg Oral BID   Chlorhexidine Gluconate Cloth  6 each Topical Daily   DULoxetine  20 mg Oral Daily   sodium  chloride flush  10-40 mL Intracatheter Q12H   sodium chloride flush  3 mL Intravenous Q12H    Infusions:  sodium chloride     amiodarone 30 mg/hr (10/16/22 0500)    PRN Medications: sodium chloride, acetaminophen, ondansetron (ZOFRAN) IV, sodium chloride flush, sodium chloride flush    Patient Profile  Harold Park is a 57 y.o. male with NICM with EF 30-35%, chronic systolic CHF, parox A-Flutter with RVR and HTN.   Admitted with shock following DCCV on 10/14/2022.   Assessment/Plan   1.  Shock, post cardioversion for A Fib RVR Recurrent A fib RVR 09/2022 and underwent successful cardioversion 7/25. Went back in A fib RVR and had cardioversion in the ED. Post cardioversion developed shock.   He has been followed by EP and was seen 10/10/22 Tikosyn was being considered so Ranolazinen was stopped and Coreg was increased. Will ask EP to consult Previously intolerant Amiodarone due to nausea/vomiting. He is willing to consider.   - Dr Gala Romney discussed with him and he is willing to start amio drip.  - TSH ok. Maintaining SR.  -Coreg has been held due to possible low output.  EP appreciated. Tolerating amio drip but having some nausea. Continue to load amio. Add scopolamine patch.  - Continue apixaban 5 mg twice a day.   2. Chronic HFrEF, NICM EF has been ~ 30-40% dating  back to 2016. Had CMRI 2017 LVEF 20% - Had cardiac pet 2023- no evidence for cardiac sarcoid or inflammatory. LVEF 38%.   - Repeat ECHO today.  EF now appears 20-25%.  - Holding GDMT for now.  -CVP 11-12. Give 80 mg IV lasix now.  - CO-OX lower today 47%. Repeat now.  - Hold GDMT   3. PVCs As above holding bb Loading amio.   4. OSA  Continue CPAP nightly.  5. CKD Stage III Recent creatinine 1.8 the end of July but previously creatinine baseline ~ 1.2.  Admitted creatinine 1.9--> today 1.7.  Follow renal function.   Repeat CO-OX now.   Length of Stay: 0  Tonye Becket, NP  10/16/2022, 8:32 AM  Advanced  Heart Failure Team Pager [phone] (M-F; 7a - 5p)  Please contact [facility] for night-coverage after hours (5p -7a ) and weekends on [email or web address]  Patient seen and examined with the above-signed Advanced Practice Provider and/or Housestaff. I personally reviewed laboratory data, imaging studies and relevant notes. I independently examined the patient and formulated the important aspects of the plan. I have edited the note to reflect any of my changes or salient points. I have personally discussed the plan with the patient and/or family.  He is back in AF with RVR this am. On IV amio. Early am co-ox was low but repeat ok. CVP elevated. Mild nausea with amio but tolerating. Scr improving  General:  Lying in bed No resp difficulty HEENT: normal Neck: supple. JVP 10. Carotids 2+ bilat; no bruits. No lymphadenopathy or thryomegaly appreciated. Cor: Irregular tachy No rubs, gallops or murmurs. Lungs: clear Abdomen: soft, nontender, nondistended. No hepatosplenomegaly. No bruits or masses. Good bowel sounds. Extremities: no cyanosis, clubbing, rash, edema Neuro: alert & orientedx3, cranial nerves grossly intact. moves all 4 extremities w/o difficulty. Affect pleasant  Back in AF. Will increase amio. Repeat bolus. May need repeat DC-CV if not converting. Eventual ablation.   Co-ox low this am but now improved. Clinically not low output. Agree with diuresis. Restart dig and spiro.   Etiology of underlying CM remains unclear. EF down further in setting of probable tachy component.   Arvilla Meres, MD  11:25 AM

## 2022-10-16 NOTE — Plan of Care (Signed)
  Problem: Education: Goal: Knowledge of General Education information will improve Description Including pain rating scale, medication(s)/side effects and non-pharmacologic comfort measures Outcome: Progressing   

## 2022-10-16 NOTE — Progress Notes (Addendum)
Telemetry reviewed remains in SR/ST mostly 90's -low 100s PVCs, frequency waxes/wanes Infrequent NSVTs 3 beats mostly, one 8beat NSVT Amiodarone as discussed by Dr. Nelly Laurence EP follow up is in place to look towards EPS/AFib ablation +/- ICD needs in the future pending LVEF recovery with SR  C/w attending/HF team  Francis Dowse, PA-C

## 2022-10-16 NOTE — Progress Notes (Signed)
Dr. Gala Romney paged regarding order clarification of pt's Amio gtt. Page promptly returned and MD endorsed to maintain gtt @60mg /hr throughout the night.

## 2022-10-16 NOTE — Progress Notes (Signed)
Pt HR 130-140s. Converted into afib. Asymptomatic. MD notified.  Kenard Gower, RN

## 2022-10-17 DIAGNOSIS — I5022 Chronic systolic (congestive) heart failure: Secondary | ICD-10-CM | POA: Diagnosis not present

## 2022-10-17 DIAGNOSIS — R579 Shock, unspecified: Secondary | ICD-10-CM | POA: Diagnosis not present

## 2022-10-17 MED ORDER — FUROSEMIDE 10 MG/ML IJ SOLN
80.0000 mg | Freq: Two times a day (BID) | INTRAMUSCULAR | Status: DC
  Administered 2022-10-17 (×2): 80 mg via INTRAVENOUS
  Filled 2022-10-17 (×2): qty 8

## 2022-10-17 MED ORDER — AMIODARONE IV BOLUS ONLY 150 MG/100ML
150.0000 mg | Freq: Once | INTRAVENOUS | Status: AC
  Administered 2022-10-17: 150 mg via INTRAVENOUS

## 2022-10-17 MED ORDER — METOLAZONE 5 MG PO TABS
2.5000 mg | ORAL_TABLET | Freq: Once | ORAL | Status: AC
  Administered 2022-10-17: 2.5 mg via ORAL
  Filled 2022-10-17: qty 1

## 2022-10-17 NOTE — Plan of Care (Signed)
  Problem: Education: Goal: Knowledge of General Education information will improve Description Including pain rating scale, medication(s)/side effects and non-pharmacologic comfort measures Outcome: Progressing   

## 2022-10-17 NOTE — Progress Notes (Addendum)
Advanced Heart Failure Rounding Note  PCP-Cardiologist: Armanda Magic, MD   Subjective:    Readmitted with shock post cardioversion for A fib RVR. GDMT held. POCUS EF 15%. Loading amio drip.   8/8 - Back  in A fib. Given amio bolus. Amio increased to 60 mg per hour. Diuresed with IV lasix. Digoxin 0.125 mcg added.   CO-OX 67%.   Denies SOB. Denies nausea.   Objective:   Weight Range: (!) 142 kg Body mass index is 37.12 kg/m.   Vital Signs:   Temp:  [97.7 F (36.5 C)-98.6 F (37 C)] 97.7 F (36.5 C) (08/09 0535) Pulse Rate:  [27-142] 113 (08/09 0804) Resp:  [11-24] 20 (08/09 0804) BP: (86-118)/(71-97) 116/75 (08/09 0804) SpO2:  [81 %-94 %] 91 % (08/09 0804) FiO2 (%):  [21 %] 21 % (08/08 2140) Weight:  [142 kg] 142 kg (08/09 0718) Last BM Date : 10/14/22  Weight change: Filed Weights   10/15/22 0443 10/16/22 0500 10/17/22 0718  Weight: (!) 143.1 kg (!) 142.3 kg (!) 142 kg    Intake/Output:   Intake/Output Summary (Last 24 hours) at 10/17/2022 0845 Last data filed at 10/17/2022 0600 Gross per 24 hour  Intake 1699.43 ml  Output 2675 ml  Net -975.57 ml     CVP 15  Physical Exam   General:  . No resp difficulty HEENT: normal Neck: supple. JVP 11-12 Carotids 2+ bilat; no bruits. No lymphadenopathy or thryomegaly appreciated. Cor: PMI nondisplaced. Tachy Irregular rate & rhythm. No rubs, gallops or murmurs. Lungs: clear Abdomen: soft, nontender, nondistended. No hepatosplenomegaly. No bruits or masses. Good bowel sounds. Extremities: no cyanosis, clubbing, rash, edema. RUE PICC Neuro: alert & orientedx3, cranial nerves grossly intact. moves all 4 extremities w/o difficulty. Affect pleasant    Telemetry   A fib 110-120s EKG    N/A  Labs    CBC Recent Labs    10/14/22 1841  WBC 11.3*  NEUTROABS 6.6  HGB 19.4*  HCT 57.4*  MCV 86.2  PLT 191   Basic Metabolic Panel Recent Labs    16/10/96 0428 10/17/22 0523  NA 134* 137  K 3.9 3.9  CL 101  99  CO2 24 25  GLUCOSE 111* 114*  BUN 35* 37*  CREATININE 1.67* 1.94*  CALCIUM 8.6* 8.5*  MG 2.3 2.2   Liver Function Tests Recent Labs    10/15/22 0126 10/16/22 0428  AST 28 22  ALT 34 28  ALKPHOS 51 40  BILITOT 0.8 1.1  PROT 6.3* 5.8*  ALBUMIN 3.5 3.2*   No results for input(s): "LIPASE", "AMYLASE" in the last 72 hours. Cardiac Enzymes No results for input(s): "CKTOTAL", "CKMB", "CKMBINDEX", "TROPONINI" in the last 72 hours.  BNP: BNP (last 3 results) Recent Labs    10/01/22 1020 10/15/22 0126  BNP 822.7* 777.2*    ProBNP (last 3 results) No results for input(s): "PROBNP" in the last 8760 hours.   D-Dimer No results for input(s): "DDIMER" in the last 72 hours. Hemoglobin A1C No results for input(s): "HGBA1C" in the last 72 hours. Fasting Lipid Panel No results for input(s): "CHOL", "HDL", "LDLCALC", "TRIG", "CHOLHDL", "LDLDIRECT" in the last 72 hours. Thyroid Function Tests Recent Labs    10/16/22 0428  TSH 3.311    Other results:   Imaging    No results found.   Medications:     Scheduled Medications:  apixaban  5 mg Oral BID   Chlorhexidine Gluconate Cloth  6 each Topical Daily   digoxin  0.125 mg Oral Daily   DULoxetine  20 mg Oral Daily   eplerenone  25 mg Oral Daily   scopolamine  1 patch Transdermal Q72H   sodium chloride flush  10-40 mL Intracatheter Q12H   sodium chloride flush  3 mL Intravenous Q12H    Infusions:  sodium chloride     amiodarone 60 mg/hr (10/17/22 0600)   amiodarone      PRN Medications: sodium chloride, acetaminophen, ondansetron (ZOFRAN) IV, sodium chloride flush, sodium chloride flush    Patient Profile  Harold Park is a 57 y.o. male with NICM with EF 30-35%, chronic systolic CHF, parox A-Flutter with RVR and HTN.   Admitted with shock following DCCV on 10/14/2022.   Assessment/Plan   1.  Shock, post cardioversion for A Fib RVR Recurrent A fib RVR 09/2022 and underwent successful cardioversion  7/25. Went back in A fib RVR and had cardioversion in the ED. Post cardioversion developed shock.   He has been followed by EP and was seen 10/10/22 Tikosyn was being considered so Ranolazinen was stopped and Coreg was increased. Will ask EP to consult Previously intolerant Amiodarone due to nausea/vomiting.    - Dr Gala Romney discussed with him. Amio load started. and he is willing to start amio drip.  - TSH ok.  - Remains in A fib RVR. Went back in A fib. Continue Amio load.  Set up for cardioversion on Monday.  -Coreg has been held due to possible low output.  EP appreciated.  -Continue scopolamine patch.  - Continue apixaban 5 mg twice a day.   2. Chronic HFrEF, NICM EF has been ~ 30-40% dating back to 2016. Had CMRI 2017 LVEF 20% - Had cardiac pet 2023- no evidence for cardiac sarcoid or inflammatory. LVEF 38%.   - Repeat ECHO today.  EF now appears 20-25%.  - Holding GDMT for now.  Digoxin 0.125 mcg  daily. Need level Monday.  -CVP 15. Give 80 mg IV lasix twice daily and give 2.5 metolazone today.   - CO-OX 66.5%.  - Hold GDMT   3. PVCs As above holding bb Loading amio.   4. OSA  Continue CPAP nightly.  5. CKD Stage III Recent creatinine 1.8 the end of July but previously creatinine baseline ~ 1.2.  Admitted creatinine 1.9--> today 1.9 Follow renal function.    Length of Stay: 1  Amy Clegg, NP  10/17/2022, 8:45 AM  Advanced Heart Failure Team Pager [phone] (M-F; 7a - 5p)  Please contact [facility] for night-coverage after hours (5p -7a ) and weekends on [email or web address]  Patient seen and examined with the above-signed Advanced Practice Provider and/or Housestaff. I personally reviewed laboratory data, imaging studies and relevant notes. I independently examined the patient and formulated the important aspects of the plan. I have edited the note to reflect any of my changes or salient points. I have personally discussed the plan with the patient and/or family.  He remains in  AF with RVR. HR somewhat improved with IV amio. CVP 15. Co-ox ox. Still with some nausea from amio.   General: Lying in bed  No resp difficulty HEENT: normal Neck: supple. JVP to jaw . Carotids 2+ bilat; no bruits. No lymphadenopathy or thryomegaly appreciated. Cor: irreg tachy Lungs: clear Abdomen: obese  soft, nontender, nondistended. No hepatosplenomegaly. No bruits or masses. Good bowel sounds. Extremities: no cyanosis, clubbing, rash, edema Neuro: alert & orientedx3, cranial nerves grossly intact. moves all 4 extremities w/o difficulty. Affect pleasant  Continue amio load.  Diurese. Follow co-ox. Plan DC-CV Monday after > 5g amio load. Consider cMRI prior to d/c.   Arvilla Meres, MD  5:03 PM

## 2022-10-18 DIAGNOSIS — I5022 Chronic systolic (congestive) heart failure: Secondary | ICD-10-CM | POA: Diagnosis not present

## 2022-10-18 DIAGNOSIS — R579 Shock, unspecified: Secondary | ICD-10-CM | POA: Diagnosis not present

## 2022-10-18 MED ORDER — POTASSIUM CHLORIDE CRYS ER 20 MEQ PO TBCR
40.0000 meq | EXTENDED_RELEASE_TABLET | ORAL | Status: AC
  Administered 2022-10-18 (×2): 40 meq via ORAL
  Filled 2022-10-18 (×2): qty 2

## 2022-10-18 NOTE — Progress Notes (Signed)
Advanced Heart Failure Rounding Note  PCP-Cardiologist: Armanda Magic, MD   Subjective:    Readmitted with shock post cardioversion for A fib RVR. GDMT held. POCUS EF 15%. Loading amio drip.   8/8 - Back  in A fib.   Remains in AF. On amio 60/hr. Rates 110-120.   Diuresed 5L with IV lasix overnight Scr up  1.9 -> 2.1. K 3.2  Feels ok. Nausea from amio has improved. Co-ox 64% CVP 13-14  Objective:   Weight Range: (!) 138.8 kg Body mass index is 36.29 kg/m.   Vital Signs:   Temp:  [97.7 F (36.5 C)-98.7 F (37.1 C)] 98 F (36.7 C) (08/10 0807) Pulse Rate:  [113-124] 120 (08/10 0807) Resp:  [14-20] 14 (08/10 0807) BP: (108-114)/(77-98) 110/83 (08/10 0807) SpO2:  [92 %-96 %] 93 % (08/10 0807) FiO2 (%):  [32 %] 32 % (08/09 2140) Weight:  [138.8 kg] 138.8 kg (08/10 0447) Last BM Date : 10/16/22  Weight change: Filed Weights   10/16/22 0500 10/17/22 0718 10/18/22 0447  Weight: (!) 142.3 kg (!) 142 kg (!) 138.8 kg    Intake/Output:   Intake/Output Summary (Last 24 hours) at 10/18/2022 0924 Last data filed at 10/18/2022 0851 Gross per 24 hour  Intake 616.65 ml  Output 5500 ml  Net -4883.35 ml      Physical Exam   General:  Sitting up in bed  No resp difficulty HEENT: normal Neck: supple. JVP 13-14Carotids 2+ bilat; no bruits. No lymphadenopathy or thryomegaly appreciated. Cor: Irreg tachy No rubs, gallops or murmurs. Lungs: clear Abdomen: obese soft, nontender, nondistended. No hepatosplenomegaly. No bruits or masses. Good bowel sounds. Extremities: no cyanosis, clubbing, rash, edema Neuro: alert & orientedx3, cranial nerves grossly intact. moves all 4 extremities w/o difficulty. Affect pleasant   Telemetry   A fib 110-120s Personally reviewed  Labs    CBC No results for input(s): "WBC", "NEUTROABS", "HGB", "HCT", "MCV", "PLT" in the last 72 hours.  Basic Metabolic Panel Recent Labs    40/98/11 0428 10/17/22 0523 10/18/22 0530  NA 134* 137 134*   K 3.9 3.9 3.2*  CL 101 99 91*  CO2 24 25 26   GLUCOSE 111* 114* 277*  BUN 35* 37* 39*  CREATININE 1.67* 1.94* 2.12*  CALCIUM 8.6* 8.5* 8.3*  MG 2.3 2.2  --    Liver Function Tests Recent Labs    10/16/22 0428  AST 22  ALT 28  ALKPHOS 40  BILITOT 1.1  PROT 5.8*  ALBUMIN 3.2*   No results for input(s): "LIPASE", "AMYLASE" in the last 72 hours. Cardiac Enzymes No results for input(s): "CKTOTAL", "CKMB", "CKMBINDEX", "TROPONINI" in the last 72 hours.  BNP: BNP (last 3 results) Recent Labs    10/01/22 1020 10/15/22 0126  BNP 822.7* 777.2*    ProBNP (last 3 results) No results for input(s): "PROBNP" in the last 8760 hours.   D-Dimer No results for input(s): "DDIMER" in the last 72 hours. Hemoglobin A1C No results for input(s): "HGBA1C" in the last 72 hours. Fasting Lipid Panel No results for input(s): "CHOL", "HDL", "LDLCALC", "TRIG", "CHOLHDL", "LDLDIRECT" in the last 72 hours. Thyroid Function Tests Recent Labs    10/16/22 0428  TSH 3.311    Other results:   Imaging    No results found.   Medications:     Scheduled Medications:  apixaban  5 mg Oral BID   Chlorhexidine Gluconate Cloth  6 each Topical Daily   DULoxetine  20 mg Oral Daily  potassium chloride  40 mEq Oral Q4H   scopolamine  1 patch Transdermal Q72H   sodium chloride flush  10-40 mL Intracatheter Q12H   sodium chloride flush  3 mL Intravenous Q12H    Infusions:  sodium chloride     amiodarone 60 mg/hr (10/18/22 0758)    PRN Medications: sodium chloride, acetaminophen, ondansetron (ZOFRAN) IV, sodium chloride flush, sodium chloride flush    Patient Profile  Harold Park is a 57 y.o. male with NICM with EF 30-35%, chronic systolic CHF, parox A-Flutter with RVR and HTN.   Admitted with shock following DCCV on 10/14/2022.   Assessment/Plan   1.  Shock, post cardioversion for A Fib RVR - Recurrent A fib RVR 09/2022 and underwent successful cardioversion 7/25. Went back in  A fib RVR and had cardioversion in the ED. Post cardioversion developed shock.   - Recurrent AF 8/8 - Now on IV amio (tolerating with some nausea) - Remains in A fib RVR. Plan DC-CV on Monday - EP has seen. Plan eventual ablation  - Coreg has been held due to possible low output.   - Continue scopolamine patch.  - Continue apixaban 5 mg twice a day.  - Holding dig with AKI  2. Acute on Chronic HFrEF, NICM - EF has been ~ 30-40% dating back to 2016. Had CMRI 2017 LVEF 20%. No LGE - Had cardiac PET 2023- no evidence for cardiac sarcoid or inflammatory. LVEF 38%.   - EF 8/24 appears 20-25%. In setting of rapid AF - Co-ox stable at 64%. Will hold digoxin and spiro for now with AKI - Resume GDMT as tolerated after DC-CV - Diuresed well overnight. CVP still 13-14 in setting of RV dysfunction Hold lasix with AKI - May need RHC after DC-CV depending on course.  3. PVCs - on amio  4. OSA  - Continue CPAP nightly.  5. AKI on CKD Stage IIIb due to cardiorenal syndrome - Recent Scr 1.8 but previously creatinine baseline ~ 1.2.  - Admitted creatinine 1.9--> 2.12 - hold lasix and spiro today  6. Hypokalemia - K 3.2 - supp   Length of Stay: 2  Arvilla Meres, MD  10/18/2022, 9:24 AM  Advanced Heart Failure Team Pager [phone] (M-F; 7a - 5p)  Please contact [facility] for night-coverage after hours (5p -7a ) and weekends on [email or web address]

## 2022-10-19 DIAGNOSIS — R579 Shock, unspecified: Secondary | ICD-10-CM | POA: Diagnosis not present

## 2022-10-19 DIAGNOSIS — I5022 Chronic systolic (congestive) heart failure: Secondary | ICD-10-CM | POA: Diagnosis not present

## 2022-10-19 LAB — BASIC METABOLIC PANEL WITH GFR
Anion gap: 12 (ref 5–15)
BUN: 37 mg/dL — ABNORMAL HIGH (ref 6–20)
CO2: 25 mmol/L (ref 22–32)
Calcium: 7.9 mg/dL — ABNORMAL LOW (ref 8.9–10.3)
Chloride: 95 mmol/L — ABNORMAL LOW (ref 98–111)
Creatinine, Ser: 1.74 mg/dL — ABNORMAL HIGH (ref 0.61–1.24)
GFR, Estimated: 45 mL/min — ABNORMAL LOW (ref >60)
Glucose, Bld: 281 mg/dL — ABNORMAL HIGH (ref 70–99)
Potassium: 2.8 mmol/L — ABNORMAL LOW (ref 3.5–5.1)
Sodium: 132 mmol/L — ABNORMAL LOW (ref 135–145)

## 2022-10-19 LAB — COOXEMETRY PANEL
Carboxyhemoglobin: 2 % — ABNORMAL HIGH (ref 0.5–1.5)
Methemoglobin: <0.7 % (ref 0.0–1.5)
O2 Saturation: 68.1 %
Total hemoglobin: 16.1 g/dL — ABNORMAL HIGH (ref 12.0–16.0)

## 2022-10-19 LAB — MAGNESIUM: Magnesium: 2 mg/dL (ref 1.7–2.4)

## 2022-10-19 MED ORDER — POTASSIUM CHLORIDE CRYS ER 20 MEQ PO TBCR
40.0000 meq | EXTENDED_RELEASE_TABLET | ORAL | Status: AC
  Administered 2022-10-19 (×3): 40 meq via ORAL
  Filled 2022-10-19 (×3): qty 2

## 2022-10-19 NOTE — Progress Notes (Signed)
Advanced Heart Failure Rounding Note  PCP-Cardiologist: Armanda Magic, MD   Subjective:    Readmitted with shock post cardioversion for A fib RVR. GDMT held. POCUS EF 15%. Loading amio drip.   8/8 - Back  in A fib.   Remains in AF. On amio 60/hr. Rates 110-120s  Denies CP or SOB.   Lasix and spiro on hold in setting of AKI. Scr improved today  2.1 -> 1.7 K 2.8 (supping)  Co-ox 68%   Objective:   Weight Range: (!) 137.1 kg Body mass index is 35.84 kg/m.   Vital Signs:   Temp:  [98.5 F (36.9 C)-99.1 F (37.3 C)] 98.7 F (37.1 C) (08/11 0932) Pulse Rate:  [72-126] 126 (08/11 0932) Resp:  [18-27] 27 (08/11 0932) BP: (112-130)/(70-105) 130/99 (08/11 0932) SpO2:  [93 %-98 %] 93 % (08/11 0932) Weight:  [137.1 kg] 137.1 kg (08/11 0632) Last BM Date : 10/16/22  Weight change: Filed Weights   10/17/22 0718 10/18/22 0447 10/19/22 0632  Weight: (!) 142 kg (!) 138.8 kg (!) 137.1 kg    Intake/Output:   Intake/Output Summary (Last 24 hours) at 10/19/2022 1235 Last data filed at 10/19/2022 0322 Gross per 24 hour  Intake 1344.53 ml  Output 880 ml  Net 464.53 ml      Physical Exam   General:  Sitting up in bed  No resp difficulty HEENT: normal Neck: supple. JVP to jaw  Carotids 2+ bilat; no bruits. No lymphadenopathy or thryomegaly appreciated. Cor: Irreg tachy Lungs: clear Abdomen: obese soft, nontender, nondistended. No hepatosplenomegaly. No bruits or masses. Good bowel sounds. Extremities: no cyanosis, clubbing, rash, edema Neuro: alert & orientedx3, cranial nerves grossly intact. moves all 4 extremities w/o difficulty. Affect pleasant  Telemetry   A fib 110-120s Personally reviewed  Labs    CBC No results for input(s): "WBC", "NEUTROABS", "HGB", "HCT", "MCV", "PLT" in the last 72 hours.  Basic Metabolic Panel Recent Labs    13/24/40 0523 10/18/22 0530 10/19/22 0619  NA 137 134* 132*  K 3.9 3.2* 2.8*  CL 99 91* 95*  CO2 25 26 25   GLUCOSE 114*  277* 281*  BUN 37* 39* 37*  CREATININE 1.94* 2.12* 1.74*  CALCIUM 8.5* 8.3* 7.9*  MG 2.2  --  2.0   Liver Function Tests No results for input(s): "AST", "ALT", "ALKPHOS", "BILITOT", "PROT", "ALBUMIN" in the last 72 hours.  No results for input(s): "LIPASE", "AMYLASE" in the last 72 hours. Cardiac Enzymes No results for input(s): "CKTOTAL", "CKMB", "CKMBINDEX", "TROPONINI" in the last 72 hours.  BNP: BNP (last 3 results) Recent Labs    10/01/22 1020 10/15/22 0126  BNP 822.7* 777.2*    ProBNP (last 3 results) No results for input(s): "PROBNP" in the last 8760 hours.   D-Dimer No results for input(s): "DDIMER" in the last 72 hours. Hemoglobin A1C No results for input(s): "HGBA1C" in the last 72 hours. Fasting Lipid Panel No results for input(s): "CHOL", "HDL", "LDLCALC", "TRIG", "CHOLHDL", "LDLDIRECT" in the last 72 hours. Thyroid Function Tests No results for input(s): "TSH", "T4TOTAL", "T3FREE", "THYROIDAB" in the last 72 hours.  Invalid input(s): "FREET3"   Other results:   Imaging    No results found.   Medications:     Scheduled Medications:  apixaban  5 mg Oral BID   Chlorhexidine Gluconate Cloth  6 each Topical Daily   DULoxetine  20 mg Oral Daily   potassium chloride  40 mEq Oral Q4H   scopolamine  1 patch Transdermal  Q72H   sodium chloride flush  10-40 mL Intracatheter Q12H   sodium chloride flush  3 mL Intravenous Q12H    Infusions:  sodium chloride     amiodarone 60 mg/hr (10/19/22 0917)    PRN Medications: sodium chloride, acetaminophen, ondansetron (ZOFRAN) IV, sodium chloride flush, sodium chloride flush    Patient Profile  Harold Park is a 57 y.o. male with NICM with EF 30-35%, chronic systolic CHF, parox A-Flutter with RVR and HTN.   Admitted with shock following DCCV on 10/14/2022.   Assessment/Plan   1.  Shock, post cardioversion for A Fib RVR - Recurrent A fib RVR 09/2022 and underwent successful cardioversion 7/25. Went  back in A fib RVR and had cardioversion in the ED. Post cardioversion developed shock.   - Recurrent AF 8/8 - Now on IV amio (tolerating with some nausea) - Remains in A fib RVR. Plan DC-CV tomorrow am. Orders writeen - EP has seen. Plan eventual ablation  - Coreg has been held due to possible low output.   - Continue scopolamine patch.  - Continue apixaban 5 mg twice a day.  - Holding dig with AKI  2. Acute on Chronic HFrEF, NICM - EF has been ~ 30-40% dating back to 2016. Had CMRI 2017 LVEF 20%. No LGE - Had cardiac PET 2023- no evidence for cardiac sarcoid or inflammatory. LVEF 38%.   - EF 8/24 appears 20-25%. In setting of rapid AF - Co-ox stable at 68%. Holding digoxin and spiro with AKI. Will restart spiro - Resume GDMT as tolerated after DC-CV - CVP still 11-13  in setting of RV dysfunction Holding lasix with AKI - May need RHC after DC-CV depending on course.  3. PVCs - on amio  4. OSA  - Continue CPAP nightly.  5. AKI on CKD Stage IIIb due to cardiorenal syndrome - Recent Scr 1.8 but previously creatinine baseline ~ 1.2.  - Admitted creatinine 1.9--> 2.12 - hold lasix and spiro today  6. Hypokalemia - K 2.8 - supping. Restart spiro   Length of Stay: 3  Arvilla Meres, MD  10/19/2022, 12:35 PM  Advanced Heart Failure Team Pager [phone] (M-F; 7a - 5p)  Please contact [facility] for night-coverage after hours (5p -7a ) and weekends on [email or web address]

## 2022-10-20 ENCOUNTER — Inpatient Hospital Stay (HOSPITAL_COMMUNITY): Payer: BC Managed Care – PPO

## 2022-10-20 ENCOUNTER — Encounter (HOSPITAL_COMMUNITY)
Admission: EM | Disposition: A | Discharge status: Home / Self Care | Payer: Self-pay | Source: Home / Self Care | Attending: Internal Medicine

## 2022-10-20 DIAGNOSIS — I5022 Chronic systolic (congestive) heart failure: Secondary | ICD-10-CM | POA: Diagnosis not present

## 2022-10-20 DIAGNOSIS — I4891 Unspecified atrial fibrillation: Secondary | ICD-10-CM | POA: Diagnosis not present

## 2022-10-20 DIAGNOSIS — R579 Shock, unspecified: Secondary | ICD-10-CM | POA: Diagnosis not present

## 2022-10-20 HISTORY — PX: CARDIOVERSION: SHX1299

## 2022-10-20 SURGERY — CARDIOVERSION
Anesthesia: General

## 2022-10-20 MED ORDER — METOCLOPRAMIDE HCL 5 MG/ML IJ SOLN
5.0000 mg | Freq: Four times a day (QID) | INTRAMUSCULAR | Status: DC | PRN
  Administered 2022-10-20 – 2022-10-21 (×3): 5 mg via INTRAVENOUS
  Filled 2022-10-20 (×3): qty 2

## 2022-10-20 MED ORDER — LIDOCAINE 2% (20 MG/ML) 5 ML SYRINGE
INTRAMUSCULAR | Status: DC | PRN
  Administered 2022-10-20: 80 mg via INTRAVENOUS

## 2022-10-20 MED ORDER — PHENYLEPHRINE HCL (PRESSORS) 10 MG/ML IV SOLN
INTRAVENOUS | Status: DC | PRN
Start: 2022-10-20 — End: 2022-10-20
  Administered 2022-10-20 (×3): 80 ug via INTRAVENOUS

## 2022-10-20 MED ORDER — PROPOFOL 10 MG/ML IV BOLUS
INTRAVENOUS | Status: DC | PRN
Start: 2022-10-20 — End: 2022-10-20
  Administered 2022-10-20: 100 mg via INTRAVENOUS
  Administered 2022-10-20: 50 mg via INTRAVENOUS

## 2022-10-20 MED ORDER — SODIUM CHLORIDE 0.9 % IV SOLN: INTRAVENOUS | Status: DC

## 2022-10-20 MED ORDER — FUROSEMIDE 10 MG/ML IJ SOLN
80.0000 mg | Freq: Once | INTRAMUSCULAR | Status: AC
  Administered 2022-10-20: 80 mg via INTRAVENOUS
  Filled 2022-10-20: qty 8

## 2022-10-20 SURGICAL SUPPLY — 1 items: ELECT DEFIB PAD ADLT CADENCE (PAD) ×1 IMPLANT

## 2022-10-20 NOTE — Interval H&P Note (Signed)
History and Physical Interval Note:  10/20/2022 11:08 AM  Harold Park  has presented today for surgery, with the diagnosis of afib.  The various methods of treatment have been discussed with the patient and family. After consideration of risks, benefits and other options for treatment, the patient has consented to  Procedure(s): CARDIOVERSION (N/A) as a surgical intervention.  The patient's history has been reviewed, patient examined, no change in status, stable for surgery.  I have reviewed the patient's chart and labs.  Questions were answered to the patient's satisfaction.      

## 2022-10-20 NOTE — Transfer of Care (Signed)
Immediate Anesthesia Transfer of Care Note  Patient: Harold Park  Procedure(s) Performed: CARDIOVERSION  Patient Location: Cath Lab  Anesthesia Type:MAC  Level of Consciousness: drowsy  Airway & Oxygen Therapy: Patient Spontanous Breathing and Patient connected to nasal cannula oxygen  Post-op Assessment: Report given to RN and Post -op Vital signs reviewed and stable  Post vital signs: Reviewed and stable  Last Vitals:  Vitals Value Taken Time  BP    Temp    Pulse    Resp    SpO2      Last Pain:  Vitals:   10/20/22 0953  TempSrc: Temporal  PainSc: 0-No pain      Patients Stated Pain Goal: 0 (10/17/22 2227)  Complications: No notable events documented.

## 2022-10-20 NOTE — Anesthesia Preprocedure Evaluation (Signed)
Anesthesia Evaluation  Patient identified by MRN, date of birth, ID band Patient awake    Reviewed: Allergy & Precautions, H&P , NPO status , Patient's Chart, lab work & pertinent test results  Airway Mallampati: II   Neck ROM: full    Dental   Pulmonary sleep apnea    breath sounds clear to auscultation       Cardiovascular hypertension, +CHF  + dysrhythmias Atrial Fibrillation  Rhythm:irregular Rate:Normal     Neuro/Psych  PSYCHIATRIC DISORDERS  Depression       GI/Hepatic   Endo/Other    Renal/GU Renal InsufficiencyRenal disease     Musculoskeletal   Abdominal   Peds  Hematology   Anesthesia Other Findings   Reproductive/Obstetrics                             Anesthesia Physical Anesthesia Plan  ASA: 3  Anesthesia Plan: General   Post-op Pain Management:    Induction: Intravenous  PONV Risk Score and Plan: 2 and Propofol infusion and Treatment may vary due to age or medical condition  Airway Management Planned: Nasal Cannula  Additional Equipment:   Intra-op Plan:   Post-operative Plan:   Informed Consent: I have reviewed the patients History and Physical, chart, labs and discussed the procedure including the risks, benefits and alternatives for the proposed anesthesia with the patient or authorized representative who has indicated his/her understanding and acceptance.     Dental advisory given  Plan Discussed with: CRNA, Anesthesiologist and Surgeon  Anesthesia Plan Comments:        Anesthesia Quick Evaluation

## 2022-10-20 NOTE — CV Procedure (Signed)
    DIRECT CURRENT CARDIOVERSION  NAME:  Harold Park   MRN: [id number] DOB:  12-19-1965   ADMIT DATE: 10/14/2022   INDICATIONS: Atrial fibrillation    PROCEDURE:   Informed consent was obtained prior to the procedure. The risks, benefits and alternatives for the procedure were discussed and the patient comprehended these risks. Once an appropriate time out was taken, the patient had the defibrillator pads placed in the anterior and posterior position. The patient then underwent sedation by the anesthesia service. Once an appropriate level of sedation was achieved, the patient received a single biphasic, synchronized 200J shock without conversion to sinus rhythm.   The pads were repositioned and he underwent another attempt at [facility] with 200J and manual pressure held on the anterior pad. He held sinus for 4 beats and then reverted to AF.  We then attempted one more DC-CV and he maintained NSR for about 20 seconds before converting back to AF.   Arvilla Meres, MD  11:09 AM

## 2022-10-20 NOTE — Discharge Summary (Addendum)
Advanced Heart Failure Team  Discharge Summary   Patient ID: Harold Park MRN: [id number], DOB/AGE: 09-28-1965 57 y.o. Admit date: 10/14/2022 D/C date:     10/24/2022   Primary Discharge Diagnoses:  1.  Shock, post cardioversion for A Fib RVR 2. Acute on Chronic HFrEF, NICM 3. PVCs 4. OSA  5. AKI on CKD Stage IIIb due to cardiorenal syndrome 6. Hypokalemia 7. Hyponatremia  Hospital Course: 57 y.o. 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  Echo 2011: EF 30-35%. Cath 2011 with normal cors.  Echo 10/17: EF 35-40%.   We saw him for the first time in May 2017.   Underwent DC-CV on 06/19/17 for recurrent AF Seen in AF Clinic on 07/02/17. Felt not to be ideal candidate for AF ablation   Echo 12/21 EF 40-45%   CT angio in 2019 aortic root was 4.6 cm   CT chest 03/06/20 to evaluate hilar fullness seen on CXR. Interval development of numerous enlarged mediastinal and bilateral hilar lymph nodes. Underwent bronchoscopy with biopsy of mediastinal nodes. 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 Treated with prednisone.    CT coronary 03/06/20. Calcium score 0. Poor arterial imaging but no obvious CAD   Back in AFL at f/u 09/29/22. Diuretics increased. Underwent DCCV to SR on 07/25. Seen by Afib clinic on 08/02 and in AFL with RVR. Ranexa stopped in anticipation of Tikosyn loading.  Presented to [place] ED 10/14/22  with AF with RVR. He was sedated and underwent cardioversion in the ED .  Following cardioversion he developed shock. Transferred to [facility] for admit . Bedside POCUS with EF 15%. Echo with EF 25-30%, RV moderately down. Initial CO-OX low but repeat was okay. Did not require inotrope support. Diuresed and GDMT scaled back.   He was seen by EP and started on IV amiodarone with plans for Afib ablation in the future. Had recurrent Afib with RVR and PVCs. DCCV attempted X 2 on 08/11, maintained sinus rhythm for seconds before converting back  to AF. Amiodarone load resumed and had repeat cardioversion on 10/23/22 with conversion to SR. Discharging on amiodarone taper. In the past he developed nausea from amiodarone but EP/HF Team felt like this was his best option for now. Nausea managed with Reglan, Zofran, and scopolamine patches. He will follow up with EP in a few weeks.   Coarse further c/b AKI on CKD IIIb. Diuretics adjusted with stabilization of creatinine. PICC line remove prior to D/C.   See below for detailed problems list. He will contine to be followed closely in the HF clinic.    1.  Shock, post cardioversion for A Fib RVR - Recurrent A fib RVR 09/2022 and underwent successful cardioversion 7/25. Went back in A fib RVR and had cardioversion in the ED. Post cardioversion developed shock.   - Recurrent AF 8/8. Failed cardioversion x2. Reloaded on IV amio.  -  EP has seen. Plan eventual ablation  - Coreg has been held due to possible low output.   - Continue scopolamine patch.  - Continue apixaban 5 mg twice a day.  - Holding dig with AKI - ON 8/15 had cardioversion--> SR. Stop amio drip. Place on amio 400 mg twice a day x7 days then 200 mg twice a day.    2. Acute on Chronic HFrEF, NICM - EF has been ~ 30-40% dating back to 2016. Had CMRI 2017 LVEF 20%. No LGE - Had cardiac PET 2023- no evidence for cardiac  sarcoid or inflammatory. LVEF 38%.   - EF 8/24 appears 20-25% and RV moderately reduced.  In setting of rapid AF - Co-ox 69%. Holding digoxin and spiro for now with AKI - CVP 12-14 . Diuresed as indicated and transitioned back on home torsemide regimen.  - Resume GDMT as tolerated after DC-CV and after AKI improves    3. PVCs - on amio, 1-2 PVCs per hour.    4. OSA  - Continue CPAP nightly.   5. AKI on CKD Stage IIIb due to cardiorenal syndrome - Recent Scr 1.8 but previously creatinine baseline ~ 1.2.  - Admitted creatinine 1.9, bumped to 2.2. Stable at 1.9 today - Continue to hold spiro.  - monitor w/  diuresis    6. Hypokalemia -Supplemented as needed.    7. Hyponatremia - improving w/ diuresis  - Na 131  Discharge Weight: 314 pounds.  Discharge Vitals: Blood pressure 103/89, pulse 92, temperature 98.8 F (37.1 C), temperature source Oral, resp. rate (!) 24, height 6\' 5"  (1.956 m), weight (!) 142.6 kg, SpO2 95%.  Labs: Lab Results  Component Value Date   WBC 12.4 (H) 10/24/2022   HGB 17.7 (H) 10/24/2022   HCT 52.8 (H) 10/24/2022   MCV 83.9 10/24/2022   PLT 181 10/24/2022    Recent Labs  Lab 10/24/22 0609  NA 131*  K 3.7  CL 94*  CO2 26  BUN 42*  CREATININE 1.92*  CALCIUM 8.5*  GLUCOSE 112*   Lab Results  Component Value Date   CHOL 158 02/02/2019   HDL 28 (L) 02/02/2019   LDLCALC 75 02/02/2019   TRIG 275 (H) 02/02/2019   BNP (last 3 results) Recent Labs    10/01/22 1020 10/15/22 0126  BNP 822.7* 777.2*    ProBNP (last 3 results) No results for input(s): "PROBNP" in the last 8760 hours.   Diagnostic Studies/Procedures   EP STUDY  Result Date: 10/23/2022 See surgical note for result.   Discharge Medications   Allergies as of 10/24/2022       Reactions   Bee Venom Anaphylaxis   Amiodarone Nausea Only   Spironolactone         Medication List     STOP taking these medications    carvedilol 25 MG tablet Commonly known as: COREG   Entresto 49-51 MG Generic drug: sacubitril-valsartan   Entresto 97-103 MG Generic drug: sacubitril-valsartan   ranolazine 500 MG 12 hr tablet Commonly known as: RANEXA       TAKE these medications    albuterol 108 (90 Base) MCG/ACT inhaler Commonly known as: VENTOLIN HFA TAKE 2 PUFFS BY MOUTH EVERY 6 HOURS AS NEEDED FOR WHEEZE OR SHORTNESS OF BREATH   amiodarone 200 MG tablet Commonly known as: PACERONE Take 400 mg twice a day x 7 days then 200 mg twice a day   BD Disp Needles 20G X 1-1/2" Misc Generic drug: NEEDLE (DISP) 20 G Use as directed   DULoxetine 20 MG capsule Commonly known as:  CYMBALTA TAKE 1 CAPSULE BY MOUTH EVERY DAY   Eliquis 5 MG Tabs tablet Generic drug: apixaban TAKE 1 TABLET BY MOUTH TWICE A DAY   Magnesium 250 MG Tabs Take 1 tablet by mouth 2 (two) times daily.   methocarbamol 500 MG tablet Commonly known as: ROBAXIN Take 500 mg by mouth 2 (two) times daily as needed for muscle spasms.   metoCLOPramide 5 MG tablet Commonly known as: Reglan Take 1 tablet (5 mg total) by mouth 3 (three) times  daily.   NEEDLE (DISP) 22 G 22G X 1" Misc Use to inject testosterone into the muscle once every 14 days.   ondansetron 4 MG tablet Commonly known as: Zofran Take 1 tablet (4 mg total) by mouth every 8 (eight) hours as needed for nausea or vomiting.   potassium chloride SA 20 MEQ tablet Commonly known as: Klor-Con M20 TAKE 4 TABLETS BY MOUTH EVERY MORNING AND 2 TABLETS DAILY W/ LUNCH AND 4 TABS EVERY EVENING. What changed: additional instructions   scopolamine 1 MG/3DAYS Commonly known as: TRANSDERM-SCOP Place 1 patch (1.5 mg total) onto the skin every 3 (three) days. Start taking on: October 25, 2022   sildenafil 100 MG tablet Commonly known as: VIAGRA TAKE 1 TABLET BY MOUTH EVERY DAY AS NEEDED FOR ERECTILE DYSFUNCTION   testosterone cypionate 200 MG/ML injection Commonly known as: DEPOTESTOSTERONE CYPIONATE Inject 1.5 mLs (300 mg total) into the muscle every 14 (fourteen) days.   torsemide 20 MG tablet Commonly known as: DEMADEX Take 0.5 tablets (10 mg total) by mouth 2 (two) times daily.        Disposition   The patient will be discharged in stable condition to home. Discharge Instructions     (HEART FAILURE PATIENTS) Call MD:  Anytime you have any of the following symptoms: 1) 3 pound weight gain in 24 hours or 5 pounds in 1 week 2) shortness of breath, with or without a dry hacking cough 3) swelling in the hands, feet or stomach 4) if you have to sleep on extra pillows at night in order to breathe.   Complete by: As directed    Diet -  low sodium heart healthy   Complete by: As directed    Increase activity slowly   Complete by: As directed        Follow-up Information     [facility] Follow up on 10/31/2022.   Specialty: Cardiology Why: at 8:30 Contact information: [address] [place] [address] [phone]                  Duration of Discharge Encounter: Greater than 35 minutes   Signed, Tonye Becket NP-C  10/24/2022, 3:25 PM   Agree with above.   Maintaining NSR after DC-V. I was planning to keep him one more day to watch his rhythm but he would like to go home and is otherwise stable. He will follow HRs closely at home. Continue Eliquis. See back in clinic next week. Repeat echo 1 month to reassess LV function. Eventual AF ablation.   Arvilla Meres, MD  11:13 PM

## 2022-10-20 NOTE — Progress Notes (Signed)
Mobility Specialist Progress Note:   10/20/22 1552  Mobility  Activity Ambulated with assistance in hallway  Level of Assistance Standby assist, set-up cues, supervision of patient - no hands on  Assistive Device  (IV Pole)  Distance Ambulated (ft) 370 ft  Activity Response Tolerated well  Mobility Referral Yes  $Mobility charge 1 Mobility  Mobility Specialist Start Time (ACUTE ONLY) 1530  Mobility Specialist Stop Time (ACUTE ONLY) 1545  Mobility Specialist Time Calculation (min) (ACUTE ONLY) 15 min   Pre Mobility: 118 HR , 119/98 BP  During Mobility: 108-128 HR  Post Mobility: 111 HR   Pt received in bed, agreeable to mobility. Denied any lightheadedness or discomfort during session. Displayed slight fatigue after ambulation otherwise asymptomatic throughout. Pt returned to bed with call bell in hand and all needs met.   Leory Plowman  Mobility Specialist Please contact via [employer] office at [phone]

## 2022-10-20 NOTE — H&P (View-Only) (Signed)
Advanced Heart Failure Rounding Note  PCP-Cardiologist: Armanda Magic, MD   Subjective:    Readmitted with shock post cardioversion for A fib RVR. GDMT held. POCUS EF 15%. Loading amio drip.   8/8 - Back  in A fib.  8/10- Diuretics/spiro held. (Last dose of IV lasix 8/9)  Remains in AF. On amio 60/hr, tachy rates 110s.   CO_OX 60%.   Scr  1.7>2.0  K 4.5    Had episode some shortness of breath last night.  Objective:   Weight Range: (!) 136.9 kg Body mass index is 35.79 kg/m.   Vital Signs:   Temp:  [97.7 F (36.5 C)-98.7 F (37.1 C)] 97.9 F (36.6 C) (08/12 0308) Pulse Rate:  [53-126] 119 (08/12 0308) Resp:  [17-27] 17 (08/12 0308) BP: (113-130)/(95-105) 122/95 (08/12 0308) SpO2:  [86 %-100 %] 91 % (08/12 0308) Weight:  [136.9 kg] 136.9 kg (08/12 0400) Last BM Date : 10/15/22  Weight change: Filed Weights   10/18/22 0447 10/19/22 0632 10/20/22 0400  Weight: (!) 138.8 kg (!) 137.1 kg (!) 136.9 kg    Intake/Output:   Intake/Output Summary (Last 24 hours) at 10/20/2022 0827 Last data filed at 10/20/2022 0329 Gross per 24 hour  Intake 771.67 ml  Output 350 ml  Net 421.67 ml    CVP 16-17   Physical Exam   General: . No resp difficulty HEENT: normal Neck: supple. JVP 16-17 . Carotids 2+ bilat; no bruits. No lymphadenopathy or thryomegaly appreciated. Cor: PMI nondisplaced. Tach Irregular rate & rhythm. No rubs, gallops or murmurs. Lungs: clear Abdomen: soft, nontender, nondistended. No hepatosplenomegaly. No bruits or masses. Good bowel sounds. Extremities: no cyanosis, clubbing, rash, edema. RUE PICC  Neuro: alert & orientedx3, cranial nerves grossly intact. moves all 4 extremities w/o difficulty. Affect pleasant  Telemetry  A fib 110s with 1-2 PVCs per hour.   Labs    CBC No results for input(s): "WBC", "NEUTROABS", "HGB", "HCT", "MCV", "PLT" in the last 72 hours.  Basic Metabolic Panel Recent Labs    78/29/56 0619 10/20/22 0655  NA 132*  134*  K 2.8* 4.5  CL 95* 99  CO2 25 22  GLUCOSE 281* 143*  BUN 37* 44*  CREATININE 1.74* 2.02*  CALCIUM 7.9* 8.9  MG 2.0 2.2   Liver Function Tests No results for input(s): "AST", "ALT", "ALKPHOS", "BILITOT", "PROT", "ALBUMIN" in the last 72 hours.  No results for input(s): "LIPASE", "AMYLASE" in the last 72 hours. Cardiac Enzymes No results for input(s): "CKTOTAL", "CKMB", "CKMBINDEX", "TROPONINI" in the last 72 hours.  BNP: BNP (last 3 results) Recent Labs    10/01/22 1020 10/15/22 0126  BNP 822.7* 777.2*    ProBNP (last 3 results) No results for input(s): "PROBNP" in the last 8760 hours.   D-Dimer No results for input(s): "DDIMER" in the last 72 hours. Hemoglobin A1C No results for input(s): "HGBA1C" in the last 72 hours. Fasting Lipid Panel No results for input(s): "CHOL", "HDL", "LDLCALC", "TRIG", "CHOLHDL", "LDLDIRECT" in the last 72 hours. Thyroid Function Tests No results for input(s): "TSH", "T4TOTAL", "T3FREE", "THYROIDAB" in the last 72 hours.  Invalid input(s): "FREET3"   Other results:   Imaging    No results found.   Medications:     Scheduled Medications:  apixaban  5 mg Oral BID   Chlorhexidine Gluconate Cloth  6 each Topical Daily   DULoxetine  20 mg Oral Daily   scopolamine  1 patch Transdermal Q72H   sodium chloride flush  10-40  mL Intracatheter Q12H   sodium chloride flush  3 mL Intravenous Q12H    Infusions:  sodium chloride     amiodarone 60 mg/hr (10/20/22 0329)    PRN Medications: sodium chloride, acetaminophen, ondansetron (ZOFRAN) IV, sodium chloride flush, sodium chloride flush    Patient Profile  Harold Park is a 57 y.o. male with NICM with EF 30-35%, chronic systolic CHF, parox A-Flutter with RVR and HTN.   Admitted with shock following DCCV on 10/14/2022.   Assessment/Plan   1.  Shock, post cardioversion for A Fib RVR - Recurrent A fib RVR 09/2022 and underwent successful cardioversion 7/25. Went back in A  fib RVR and had cardioversion in the ED. Post cardioversion developed shock.   - Recurrent AF 8/8 - Now on IV amio (tolerating with some nausea) - Remains in A fib RVR. Plan DC-CV today. NPO.  - EP has seen. Plan eventual ablation  - Coreg has been held due to possible low output.   - Continue scopolamine patch.  - Continue apixaban 5 mg twice a day.  - Holding dig with AKI  2. Acute on Chronic HFrEF, NICM - EF has been ~ 30-40% dating back to 2016. Had CMRI 2017 LVEF 20%. No LGE - Had cardiac PET 2023- no evidence for cardiac sarcoid or inflammatory. LVEF 38%.   - EF 8/24 appears 20-25%. In setting of rapid AF - Co-ox stable at 60%  Will hold digoxin and spiro for now with AKI - Resume GDMT as tolerated after DC-CV -Off diuretics for the last couple of days. CVP trending up 16-17. Given 80 mg IV lasix now.   3. PVCs - on amio,  1-2 PVCs per hour.   4. OSA  - Continue CPAP nightly.  5. AKI on CKD Stage IIIb due to cardiorenal syndrome - Recent Scr 1.8 but previously creatinine baseline ~ 1.2.  - Admitted creatinine 1.9, today 2.02.  - Continue to hold  spiro.   6. Hypokalemia - Stable.   Plan cardioversion today.  Length of Stay: 4  Amy Clegg, NP  10/20/2022, 8:27 AM  Advanced Heart Failure Team Pager [phone] (M-F; 7a - 5p)  Please contact [facility] for night-coverage after hours (5p -7a ) and weekends on [email or web address]  Patient seen and examined with the above-signed Advanced Practice Provider and/or Housestaff. I personally reviewed laboratory data, imaging studies and relevant notes. I independently examined the patient and formulated the important aspects of the plan. I have edited the note to reflect any of my changes or salient points. I have personally discussed the plan with the patient and/or family.  Remains in AF with RVR despite IV amio. Co-ox ok but CVP climbing. Was SOB overnight.   General:  Sitting up in bed  No resp difficulty HEENT: normal Neck:  supple. JVP to jaw  Carotids 2+ bilat; no bruits. No lymphadenopathy or thryomegaly appreciated. Cor: Irregular  tachy Lungs: clear Abdomen: obese soft, nontender, nondistended. No hepatosplenomegaly. No bruits or masses. Good bowel sounds. Extremities: no cyanosis, clubbing, rash, edema Neuro: alert & orientedx3, cranial nerves grossly intact. moves all 4 extremities w/o difficulty. Affect pleasant  Will give dose of lasix today. Plan repeat DC-CV this am with amio support. May need RHC prior to d/c.   Arvilla Meres, MD  8:52 AM

## 2022-10-20 NOTE — Progress Notes (Addendum)
Advanced Heart Failure Rounding Note  PCP-Cardiologist: Armanda Magic, MD   Subjective:    Readmitted with shock post cardioversion for A fib RVR. GDMT held. POCUS EF 15%. Loading amio drip.   8/8 - Back  in A fib.  8/10- Diuretics/spiro held. (Last dose of IV lasix 8/9)  Remains in AF. On amio 60/hr, tachy rates 110s.   CO_OX 60%.   Scr  1.7>2.0  K 4.5    Had episode some shortness of breath last night.  Objective:   Weight Range: (!) 136.9 kg Body mass index is 35.79 kg/m.   Vital Signs:   Temp:  [97.7 F (36.5 C)-98.7 F (37.1 C)] 97.9 F (36.6 C) (08/12 0308) Pulse Rate:  [53-126] 119 (08/12 0308) Resp:  [17-27] 17 (08/12 0308) BP: (113-130)/(95-105) 122/95 (08/12 0308) SpO2:  [86 %-100 %] 91 % (08/12 0308) Weight:  [136.9 kg] 136.9 kg (08/12 0400) Last BM Date : 10/15/22  Weight change: Filed Weights   10/18/22 0447 10/19/22 0632 10/20/22 0400  Weight: (!) 138.8 kg (!) 137.1 kg (!) 136.9 kg    Intake/Output:   Intake/Output Summary (Last 24 hours) at 10/20/2022 0827 Last data filed at 10/20/2022 0329 Gross per 24 hour  Intake 771.67 ml  Output 350 ml  Net 421.67 ml    CVP 16-17   Physical Exam   General: . No resp difficulty HEENT: normal Neck: supple. JVP 16-17 . Carotids 2+ bilat; no bruits. No lymphadenopathy or thryomegaly appreciated. Cor: PMI nondisplaced. Tach Irregular rate & rhythm. No rubs, gallops or murmurs. Lungs: clear Abdomen: soft, nontender, nondistended. No hepatosplenomegaly. No bruits or masses. Good bowel sounds. Extremities: no cyanosis, clubbing, rash, edema. RUE PICC  Neuro: alert & orientedx3, cranial nerves grossly intact. moves all 4 extremities w/o difficulty. Affect pleasant  Telemetry  A fib 110s with 1-2 PVCs per hour.   Labs    CBC No results for input(s): "WBC", "NEUTROABS", "HGB", "HCT", "MCV", "PLT" in the last 72 hours.  Basic Metabolic Panel Recent Labs    78/29/56 0619 10/20/22 0655  NA 132*  134*  K 2.8* 4.5  CL 95* 99  CO2 25 22  GLUCOSE 281* 143*  BUN 37* 44*  CREATININE 1.74* 2.02*  CALCIUM 7.9* 8.9  MG 2.0 2.2   Liver Function Tests No results for input(s): "AST", "ALT", "ALKPHOS", "BILITOT", "PROT", "ALBUMIN" in the last 72 hours.  No results for input(s): "LIPASE", "AMYLASE" in the last 72 hours. Cardiac Enzymes No results for input(s): "CKTOTAL", "CKMB", "CKMBINDEX", "TROPONINI" in the last 72 hours.  BNP: BNP (last 3 results) Recent Labs    10/01/22 1020 10/15/22 0126  BNP 822.7* 777.2*    ProBNP (last 3 results) No results for input(s): "PROBNP" in the last 8760 hours.   D-Dimer No results for input(s): "DDIMER" in the last 72 hours. Hemoglobin A1C No results for input(s): "HGBA1C" in the last 72 hours. Fasting Lipid Panel No results for input(s): "CHOL", "HDL", "LDLCALC", "TRIG", "CHOLHDL", "LDLDIRECT" in the last 72 hours. Thyroid Function Tests No results for input(s): "TSH", "T4TOTAL", "T3FREE", "THYROIDAB" in the last 72 hours.  Invalid input(s): "FREET3"   Other results:   Imaging    No results found.   Medications:     Scheduled Medications:  apixaban  5 mg Oral BID   Chlorhexidine Gluconate Cloth  6 each Topical Daily   DULoxetine  20 mg Oral Daily   scopolamine  1 patch Transdermal Q72H   sodium chloride flush  10-40  mL Intracatheter Q12H   sodium chloride flush  3 mL Intravenous Q12H    Infusions:  sodium chloride     amiodarone 60 mg/hr (10/20/22 0329)    PRN Medications: sodium chloride, acetaminophen, ondansetron (ZOFRAN) IV, sodium chloride flush, sodium chloride flush    Patient Profile  Harold Park is a 57 y.o. male with NICM with EF 30-35%, chronic systolic CHF, parox A-Flutter with RVR and HTN.   Admitted with shock following DCCV on 10/14/2022.   Assessment/Plan   1.  Shock, post cardioversion for A Fib RVR - Recurrent A fib RVR 09/2022 and underwent successful cardioversion 7/25. Went back in A  fib RVR and had cardioversion in the ED. Post cardioversion developed shock.   - Recurrent AF 8/8 - Now on IV amio (tolerating with some nausea) - Remains in A fib RVR. Plan DC-CV today. NPO.  - EP has seen. Plan eventual ablation  - Coreg has been held due to possible low output.   - Continue scopolamine patch.  - Continue apixaban 5 mg twice a day.  - Holding dig with AKI  2. Acute on Chronic HFrEF, NICM - EF has been ~ 30-40% dating back to 2016. Had CMRI 2017 LVEF 20%. No LGE - Had cardiac PET 2023- no evidence for cardiac sarcoid or inflammatory. LVEF 38%.   - EF 8/24 appears 20-25%. In setting of rapid AF - Co-ox stable at 60%  Will hold digoxin and spiro for now with AKI - Resume GDMT as tolerated after DC-CV -Off diuretics for the last couple of days. CVP trending up 16-17. Given 80 mg IV lasix now.   3. PVCs - on amio,  1-2 PVCs per hour.   4. OSA  - Continue CPAP nightly.  5. AKI on CKD Stage IIIb due to cardiorenal syndrome - Recent Scr 1.8 but previously creatinine baseline ~ 1.2.  - Admitted creatinine 1.9, today 2.02.  - Continue to hold  spiro.   6. Hypokalemia - Stable.   Plan cardioversion today.  Length of Stay: 4  Amy Clegg, NP  10/20/2022, 8:27 AM  Advanced Heart Failure Team Pager [phone] (M-F; 7a - 5p)  Please contact [facility] for night-coverage after hours (5p -7a ) and weekends on [email or web address]  Patient seen and examined with the above-signed Advanced Practice Provider and/or Housestaff. I personally reviewed laboratory data, imaging studies and relevant notes. I independently examined the patient and formulated the important aspects of the plan. I have edited the note to reflect any of my changes or salient points. I have personally discussed the plan with the patient and/or family.  Remains in AF with RVR despite IV amio. Co-ox ok but CVP climbing. Was SOB overnight.   General:  Sitting up in bed  No resp difficulty HEENT: normal Neck:  supple. JVP to jaw  Carotids 2+ bilat; no bruits. No lymphadenopathy or thryomegaly appreciated. Cor: Irregular  tachy Lungs: clear Abdomen: obese soft, nontender, nondistended. No hepatosplenomegaly. No bruits or masses. Good bowel sounds. Extremities: no cyanosis, clubbing, rash, edema Neuro: alert & orientedx3, cranial nerves grossly intact. moves all 4 extremities w/o difficulty. Affect pleasant  Will give dose of lasix today. Plan repeat DC-CV this am with amio support. May need RHC prior to d/c.   Arvilla Meres, MD  8:52 AM

## 2022-10-21 ENCOUNTER — Encounter (HOSPITAL_COMMUNITY): Payer: Self-pay | Admitting: Internal Medicine

## 2022-10-21 DIAGNOSIS — R579 Shock, unspecified: Secondary | ICD-10-CM | POA: Diagnosis not present

## 2022-10-21 DIAGNOSIS — J189 Pneumonia, unspecified organism: Secondary | ICD-10-CM | POA: Diagnosis not present

## 2022-10-21 DIAGNOSIS — Z1152 Encounter for screening for COVID-19: Secondary | ICD-10-CM | POA: Diagnosis not present

## 2022-10-21 DIAGNOSIS — I5022 Chronic systolic (congestive) heart failure: Secondary | ICD-10-CM | POA: Diagnosis not present

## 2022-10-21 DIAGNOSIS — R570 Cardiogenic shock: Secondary | ICD-10-CM | POA: Diagnosis not present

## 2022-10-21 DIAGNOSIS — I5023 Acute on chronic systolic (congestive) heart failure: Secondary | ICD-10-CM | POA: Diagnosis not present

## 2022-10-21 LAB — GLUCOSE, CAPILLARY
Glucose-Capillary: 124 mg/dL — ABNORMAL HIGH (ref 70–99)
Glucose-Capillary: 92 mg/dL (ref 70–99)
Glucose-Capillary: 108 mg/dL — ABNORMAL HIGH (ref 70–99)

## 2022-10-21 LAB — COOXEMETRY PANEL
Carboxyhemoglobin: 1.2 % (ref 0.5–1.5)
Methemoglobin: <0.7 % (ref 0.0–1.5)
O2 Saturation: 66.9 %
Total hemoglobin: 18.3 g/dL — ABNORMAL HIGH (ref 12.0–16.0)

## 2022-10-21 MED ORDER — INSULIN ASPART 100 UNIT/ML IJ SOLN: 0.0000 [IU] | Freq: Every day | INTRAMUSCULAR | Status: DC

## 2022-10-21 MED ORDER — SENNOSIDES-DOCUSATE SODIUM 8.6-50 MG PO TABS
1.0000 | ORAL_TABLET | Freq: Two times a day (BID) | ORAL | Status: DC
  Administered 2022-10-21 – 2022-10-24 (×6): 1 via ORAL
  Filled 2022-10-21 (×7): qty 1

## 2022-10-21 MED ORDER — ALTEPLASE 2 MG IJ SOLR
2.0000 mg | Freq: Once | INTRAMUSCULAR | Status: AC
  Administered 2022-10-21: 2 mg
  Filled 2022-10-21: qty 2

## 2022-10-21 MED ORDER — FUROSEMIDE 10 MG/ML IJ SOLN
80.0000 mg | Freq: Once | INTRAMUSCULAR | Status: AC
  Administered 2022-10-21: 80 mg via INTRAVENOUS
  Filled 2022-10-21: qty 8

## 2022-10-21 MED ORDER — INSULIN ASPART 100 UNIT/ML IJ SOLN
0.0000 [IU] | Freq: Three times a day (TID) | INTRAMUSCULAR | Status: DC
  Administered 2022-10-22: 1 [IU] via SUBCUTANEOUS
  Administered 2022-10-22: 2 [IU] via SUBCUTANEOUS
  Administered 2022-10-23 – 2022-10-24 (×3): 1 [IU] via SUBCUTANEOUS

## 2022-10-21 MED ORDER — POLYETHYLENE GLYCOL 3350 17 G PO PACK
17.0000 g | PACK | Freq: Every day | ORAL | Status: DC
  Administered 2022-10-21 – 2022-10-24 (×3): 17 g via ORAL
  Filled 2022-10-21 (×4): qty 1

## 2022-10-21 NOTE — Progress Notes (Addendum)
Advanced Heart Failure Rounding Note  PCP-Cardiologist: Armanda Magic, MD   Subjective:   Readmitted with shock post cardioversion for A fib RVR. GDMT held. POCUS EF 15%. Loading amio drip.   8/8 - Back  in A fib.  8/10- Diuretics/spiro held. (Last dose of IV lasix 8/9) 8/12: failed DCCV x2  Remains in AF. On amio 60/hr, tachy rates 110s.   CVP 16/17 yesterday, given 80 IV lasix x1. -2L UOP. Weight down 1lb.   Co-ox 43%, repeat ordered. CVP 17  Scr  1.7>2.0>2.24  Na 129 (corrected 134)  Feels ok just tired and trying to nap. Wearing bpap. Intt SOB.    Objective:   Weight Range: (!) 136.1 kg Body mass index is 35.58 kg/m.   Vital Signs:   Temp:  [96.3 F (35.7 C)-98.3 F (36.8 C)] 98.3 F (36.8 C) (08/13 0744) Pulse Rate:  [69-217] 107 (08/13 0744) Resp:  [12-22] 20 (08/13 0744) BP: (89-144)/(58-123) 104/89 (08/13 0744) SpO2:  [90 %-97 %] 97 % (08/13 0744) FiO2 (%):  [21 %] 21 % (08/12 2259) Weight:  [136.1 kg] 136.1 kg (08/13 0326) Last BM Date : 10/18/22  Weight change: Filed Weights   10/19/22 0632 10/20/22 0400 10/21/22 0326  Weight: (!) 137.1 kg (!) 136.9 kg (!) 136.1 kg    Intake/Output:   Intake/Output Summary (Last 24 hours) at 10/21/2022 0912 Last data filed at 10/21/2022 0501 Gross per 24 hour  Intake 928.45 ml  Output 1695 ml  Net -766.55 ml    CVP 17 Physical Exam  General:  well appearing.  No respiratory difficulty HEENT: normal. + bipap Neck: supple. JVD appears elevated. Carotids 2+ bilat; no bruits. No lymphadenopathy or thyromegaly appreciated. Cor: PMI nondisplaced. Irregular rate & rhythm. No rubs, gallops or murmurs. Lungs: clear, diminished bases Abdomen: soft, nontender, nondistended. No hepatosplenomegaly. No bruits or masses. Good bowel sounds. Extremities: no cyanosis, clubbing, rash, edema. PICC RUE Neuro: alert & oriented x 3, cranial nerves grossly intact. moves all 4 extremities w/o difficulty. Affect pleasant.   Telemetry   A fib 110s 1-2 PVCs/hr (Personally reviewed)    Labs    CBC No results for input(s): "WBC", "NEUTROABS", "HGB", "HCT", "MCV", "PLT" in the last 72 hours.  Basic Metabolic Panel Recent Labs    41/32/44 0655 10/21/22 0138  NA 134* 129*  K 4.5 3.7  CL 99 92*  CO2 22 22  GLUCOSE 143* 323*  BUN 44* 43*  CREATININE 2.02* 2.24*  CALCIUM 8.9 7.9*  MG 2.2 2.1   Liver Function Tests No results for input(s): "AST", "ALT", "ALKPHOS", "BILITOT", "PROT", "ALBUMIN" in the last 72 hours.  No results for input(s): "LIPASE", "AMYLASE" in the last 72 hours. Cardiac Enzymes No results for input(s): "CKTOTAL", "CKMB", "CKMBINDEX", "TROPONINI" in the last 72 hours.  BNP: BNP (last 3 results) Recent Labs    10/01/22 1020 10/15/22 0126  BNP 822.7* 777.2*    ProBNP (last 3 results) No results for input(s): "PROBNP" in the last 8760 hours.   D-Dimer No results for input(s): "DDIMER" in the last 72 hours. Hemoglobin A1C No results for input(s): "HGBA1C" in the last 72 hours. Fasting Lipid Panel No results for input(s): "CHOL", "HDL", "LDLCALC", "TRIG", "CHOLHDL", "LDLDIRECT" in the last 72 hours. Thyroid Function Tests No results for input(s): "TSH", "T4TOTAL", "T3FREE", "THYROIDAB" in the last 72 hours.  Invalid input(s): "FREET3"  Other results:  Imaging    EP STUDY  Result Date: 10/20/2022 See surgical note for result.  Medications:   Scheduled Medications:  apixaban  5 mg Oral BID   Chlorhexidine Gluconate Cloth  6 each Topical Daily   DULoxetine  20 mg Oral Daily   scopolamine  1 patch Transdermal Q72H   sodium chloride flush  10-40 mL Intracatheter Q12H   sodium chloride flush  3 mL Intravenous Q12H    Infusions:  sodium chloride Stopped (10/20/22 2028)   amiodarone 60 mg/hr (10/21/22 0838)    PRN Medications: sodium chloride, acetaminophen, metoCLOPramide (REGLAN) injection, ondansetron (ZOFRAN) IV, sodium chloride flush, sodium chloride  flush  Patient Profile  Esaias Kromer is a 57 y.o. male with NICM with EF 30-35%, chronic systolic CHF, parox A-Flutter with RVR and HTN.   Admitted with shock following DCCV on 10/14/2022.  Assessment/Plan  1.  Shock, post cardioversion for A Fib RVR - Recurrent A fib RVR 09/2022 and underwent successful cardioversion 7/25. Went back in A fib RVR and had cardioversion in the ED. Post cardioversion developed shock.   - Recurrent AF 8/8 - Now on IV amio (tolerating with some nausea) - Remains in A fib RVR. Failed DCCV x2 yesterday - EP has seen. Plan eventual ablation  - Coreg has been held due to possible low output.   - Continue scopolamine patch.  - Continue apixaban 5 mg twice a day.  - Holding dig with AKI  2. Acute on Chronic HFrEF, NICM - EF has been ~ 30-40% dating back to 2016. Had CMRI 2017 LVEF 20%. No LGE - Had cardiac PET 2023- no evidence for cardiac sarcoid or inflammatory. LVEF 38%.   - EF 8/24 appears 20-25%. In setting of rapid AF - Co-ox 43%. Repeat oredered. Holding digoxin and spiro for now with AKI - Resume GDMT as tolerated after DC-CV - Off diuretics for the last couple of days. S/p 80 IV lasix x1 yesterday for high CVP. SCr trending up. Remains volume overloaded, CVP 17. Consider RHC to better assess hemodynamics. Give 80 IV lasix, follow response  3. PVCs - on amio, 1-2 PVCs per hour.   4. OSA  - Continue CPAP nightly.  5. AKI on CKD Stage IIIb due to cardiorenal syndrome - Recent Scr 1.8 but previously creatinine baseline ~ 1.2.  - Admitted creatinine 1.9, today 2.24.  - Continue to hold spiro.   6. Hypokalemia - Stable.   7. Hyponatremia - Na 129, corrected 134 - stable   Length of Stay: 5  Alen Bleacher, NP  10/21/2022, 9:12 AM  Advanced Heart Failure Team Pager [phone] (M-F; 7a - 5p)  Please contact [facility] for night-coverage after hours (5p -7a ) and weekends on [email or web address]  Patient seen and examined with the above-signed Advanced  Practice Provider and/or Housestaff. I personally reviewed laboratory data, imaging studies and relevant notes. I independently examined the patient and formulated the important aspects of the plan. I have edited the note to reflect any of my changes or salient points. I have personally discussed the plan with the patient and/or family.  Failed DC-CV yesterday. Remains on IV amio. In AF but rates slower. Now 100-110. Co-ox ok. CVP 16-17. SCr up a bit  General:  Sitting up in bed  No resp difficulty HEENT: normal Neck: supple. JVP to jaw . Carotids 2+ bilat; no bruits. No lymphadenopathy or thryomegaly appreciated. Cor: Irregular rate. Tachy  Lungs: clear Abdomen: obese soft, nontender, nondistended. No hepatosplenomegaly. No bruits or masses. Good bowel sounds. Extremities: no cyanosis, clubbing, rash, edema Neuro: alert & orientedx3, cranial nerves  grossly intact. moves all 4 extremities w/o difficulty. Affect pleasant  Remains in AF. Continue to load IV amio. Plan repeat DC-CV on Thursday. Continue daily IV lasix.   Arvilla Meres, MD  6:17 PM

## 2022-10-21 NOTE — Inpatient Diabetes Management (Signed)
Inpatient Diabetes Program Recommendations  AACE/ADA: New Consensus Statement on Inpatient Glycemic Control (2015)  Target Ranges:  Prepandial:   less than 140 mg/dL      Peak postprandial:   less than 180 mg/dL (1-2 hours)      Critically ill patients:  140 - 180 mg/dL   Lab Results  Component Value Date   HGBA1C 5.8 (H) 02/02/2019    Review of Glycemic Control  Latest Reference Range & Units 10/21/22 01:38  Glucose 70 - 99 mg/dL 161 (H)   Diabetes history: None noted Outpatient Diabetes medications:  None? Ozempic 2.4 mg weekly Current orders for Inpatient glycemic control:  None  Inpatient Diabetes Program Recommendations:    -   obtain A1c level to determine glucose control over the past 2-3 months -   order CBGs and Novolog Correction tid + hs 0-9 units  Thanks,  Christena Deem RN, MSN, BC-ADM Inpatient Diabetes Coordinator Team Pager [phone] (8a-5p)

## 2022-10-21 NOTE — Progress Notes (Signed)
Pt has home CPAp, no assistance needed.

## 2022-10-21 NOTE — Anesthesia Postprocedure Evaluation (Signed)
Anesthesia Post Note  Patient: Harold Park  Procedure(s) Performed: CARDIOVERSION     Patient location during evaluation: Cath Lab Anesthesia Type: General Level of consciousness: awake and alert Pain management: pain level controlled Vital Signs Assessment: post-procedure vital signs reviewed and stable Respiratory status: spontaneous breathing, nonlabored ventilation, respiratory function stable and patient connected to nasal cannula oxygen Cardiovascular status: blood pressure returned to baseline and stable Postop Assessment: no apparent nausea or vomiting Anesthetic complications: no   No notable events documented.  Last Vitals:  Vitals:   10/21/22 0324 10/21/22 0744  BP: 121/81 104/89  Pulse: (!) 116 (!) 107  Resp: 18 20  Temp: 36.6 C 36.8 C  SpO2: 97% 97%    Last Pain:  Vitals:   10/21/22 1027  TempSrc:   PainSc: 0-No pain                 , S

## 2022-10-21 NOTE — Progress Notes (Signed)
Mobility Specialist Progress Note:   10/21/22 1151  Mobility  Activity Ambulated with assistance in hallway  Level of Assistance Contact guard assist, steadying assist  Assistive Device  (IV Pole)  Distance Ambulated (ft) 100 ft  Activity Response Tolerated well  Mobility Referral Yes  $Mobility charge 1 Mobility  Mobility Specialist Start Time (ACUTE ONLY) 1130  Mobility Specialist Stop Time (ACUTE ONLY) 1140  Mobility Specialist Time Calculation (min) (ACUTE ONLY) 10 min   Pre Mobility: 110 HR  During Mobility: 108-123 HR  Post Mobility: 119 HR  Pt received in bed, agreeable to mobility. C/o feeling fuzzy during ambulation causing slight LOB. RN notified. Pt left EOB as his request with call bell in reach and all needs met.   Leory Plowman  Mobility Specialist Please contact via [employer] office at [phone]

## 2022-10-22 ENCOUNTER — Encounter (HOSPITAL_COMMUNITY): Payer: BC Managed Care – PPO

## 2022-10-22 DIAGNOSIS — I5022 Chronic systolic (congestive) heart failure: Secondary | ICD-10-CM | POA: Diagnosis not present

## 2022-10-22 DIAGNOSIS — R579 Shock, unspecified: Secondary | ICD-10-CM | POA: Diagnosis not present

## 2022-10-22 LAB — GLUCOSE, CAPILLARY
Glucose-Capillary: 114 mg/dL — ABNORMAL HIGH (ref 70–99)
Glucose-Capillary: 139 mg/dL — ABNORMAL HIGH (ref 70–99)
Glucose-Capillary: 154 mg/dL — ABNORMAL HIGH (ref 70–99)
Glucose-Capillary: 168 mg/dL — ABNORMAL HIGH (ref 70–99)

## 2022-10-22 MED ORDER — POTASSIUM CHLORIDE CRYS ER 20 MEQ PO TBCR
40.0000 meq | EXTENDED_RELEASE_TABLET | ORAL | Status: AC
  Administered 2022-10-22 (×2): 40 meq via ORAL
  Filled 2022-10-22 (×2): qty 2

## 2022-10-22 MED ORDER — SODIUM CHLORIDE 0.9 % IV SOLN: INTRAVENOUS | Status: DC

## 2022-10-22 MED ORDER — AMIODARONE HCL IN DEXTROSE 360-4.14 MG/200ML-% IV SOLN
30.0000 mg/h | INTRAVENOUS | Status: DC
  Administered 2022-10-22: 30 mg/h via INTRAVENOUS
  Filled 2022-10-22: qty 200

## 2022-10-22 MED ORDER — METOCLOPRAMIDE HCL 5 MG/ML IJ SOLN
10.0000 mg | Freq: Three times a day (TID) | INTRAMUSCULAR | Status: DC
  Administered 2022-10-22 – 2022-10-24 (×7): 10 mg via INTRAVENOUS
  Filled 2022-10-22 (×7): qty 2

## 2022-10-22 MED ORDER — SORBITOL 70 % SOLN
30.0000 mL | Freq: Once | Status: AC
  Administered 2022-10-22: 30 mL via ORAL
  Filled 2022-10-22: qty 30

## 2022-10-22 MED ORDER — FUROSEMIDE 10 MG/ML IJ SOLN
80.0000 mg | Freq: Once | INTRAMUSCULAR | Status: AC
  Administered 2022-10-22: 80 mg via INTRAVENOUS
  Filled 2022-10-22: qty 8

## 2022-10-22 NOTE — Progress Notes (Addendum)
Advanced Heart Failure Rounding Note  PCP-Cardiologist: Armanda Magic, MD   Subjective:   Readmitted with shock post cardioversion for A fib RVR. GDMT held. POCUS EF 15%. Loading amio drip.   8/8 - Back  in A fib.  8/10- Diuretics/spiro held. (Last dose of IV lasix 8/9) 8/12: failed DCCV x2  Remains in AF. Rates 110s. On amio gtt at 30/hr   CVP 12.  Co-ox 63%  Scr  1.7>2.0>2.24>>2.07.   Feels ok currently. No resting dyspnea. Only complaint is constipation.    Objective:   Weight Range: (!) 136.1 kg Body mass index is 35.58 kg/m.   Vital Signs:   Temp:  [97.7 F (36.5 C)-98.9 F (37.2 C)] 97.7 F (36.5 C) (08/14 0918) Pulse Rate:  [64-120] 109 (08/14 0918) Resp:  [16-26] 20 (08/14 0918) BP: (99-106)/(70-90) 106/90 (08/14 0918) SpO2:  [95 %-97 %] 96 % (08/14 0918) Last BM Date : 10/21/22 (per pt)  Weight change: Filed Weights   10/19/22 0632 10/20/22 0400 10/21/22 0326  Weight: (!) 137.1 kg (!) 136.9 kg (!) 136.1 kg    Intake/Output:   Intake/Output Summary (Last 24 hours) at 10/22/2022 1041 Last data filed at 10/22/2022 0400 Gross per 24 hour  Intake 1619.23 ml  Output 3600 ml  Net -1980.77 ml    CVP 12 Physical Exam   General:  Well appearing. No respiratory difficulty HEENT: normal Neck: supple. JVD 12 cm. Carotids 2+ bilat; no bruits. No lymphadenopathy or thyromegaly appreciated. Cor: PMI nondisplaced. Irregularly irregular rhythm and rate. No rubs, gallops or murmurs. Lungs: clear Abdomen: soft, nontender, nondistended. No hepatosplenomegaly. No bruits or masses. Good bowel sounds. Extremities: no cyanosis, clubbing, rash, trace b/l LE edema + RUE PICC  Neuro: alert & oriented x 3, cranial nerves grossly intact. moves all 4 extremities w/o difficulty. Affect pleasant.  Telemetry   A fib 110s 1-2 PVCs/hr (Personally reviewed)    Labs    CBC No results for input(s): "WBC", "NEUTROABS", "HGB", "HCT", "MCV", "PLT" in the last 72  hours.  Basic Metabolic Panel Recent Labs    08/65/78 0138 10/22/22 0549  NA 129* 133*  K 3.7 3.2*  CL 92* 96*  CO2 22 25  GLUCOSE 323* 127*  BUN 43* 46*  CREATININE 2.24* 2.07*  CALCIUM 7.9* 8.4*  MG 2.1 2.1   Liver Function Tests No results for input(s): "AST", "ALT", "ALKPHOS", "BILITOT", "PROT", "ALBUMIN" in the last 72 hours.  No results for input(s): "LIPASE", "AMYLASE" in the last 72 hours. Cardiac Enzymes No results for input(s): "CKTOTAL", "CKMB", "CKMBINDEX", "TROPONINI" in the last 72 hours.  BNP: BNP (last 3 results) Recent Labs    10/01/22 1020 10/15/22 0126  BNP 822.7* 777.2*    ProBNP (last 3 results) No results for input(s): "PROBNP" in the last 8760 hours.   D-Dimer No results for input(s): "DDIMER" in the last 72 hours. Hemoglobin A1C Recent Labs    10/21/22 1053  HGBA1C 6.4*   Fasting Lipid Panel No results for input(s): "CHOL", "HDL", "LDLCALC", "TRIG", "CHOLHDL", "LDLDIRECT" in the last 72 hours. Thyroid Function Tests No results for input(s): "TSH", "T4TOTAL", "T3FREE", "THYROIDAB" in the last 72 hours.  Invalid input(s): "FREET3"  Other results:  Imaging    No results found.   Medications:   Scheduled Medications:  apixaban  5 mg Oral BID   Chlorhexidine Gluconate Cloth  6 each Topical Daily   DULoxetine  20 mg Oral Daily   furosemide  80 mg Intravenous Once  insulin aspart  0-5 Units Subcutaneous QHS   insulin aspart  0-9 Units Subcutaneous TID WC   metoCLOPramide (REGLAN) injection  10 mg Intravenous Q8H   polyethylene glycol  17 g Oral Daily   scopolamine  1 patch Transdermal Q72H   senna-docusate  1 tablet Oral BID   sodium chloride flush  10-40 mL Intracatheter Q12H   sodium chloride flush  3 mL Intravenous Q12H   sorbitol  30 mL Oral Once    Infusions:  sodium chloride Stopped (10/20/22 2028)   sodium chloride     amiodarone 60 mg/hr (10/22/22 0218)    PRN Medications: sodium chloride, acetaminophen,  ondansetron (ZOFRAN) IV, sodium chloride flush, sodium chloride flush  Patient Profile  Harold Park is a 57 y.o. male with NICM with EF 30-35%, chronic systolic CHF, parox A-Flutter with RVR and HTN.   Admitted with shock following DCCV on 10/14/2022.  Assessment/Plan   1.  Shock, post cardioversion for A Fib RVR - Recurrent A fib RVR 09/2022 and underwent successful cardioversion 7/25. Went back in A fib RVR and had cardioversion in the ED. Post cardioversion developed shock.   - Recurrent AF 8/8 - Now on IV amio (tolerating with some nausea). Increase rate back to 60 mg/hr  - Remains in A fib RVR. Failed DCCV x2  - EP has seen. Plan eventual ablation  - Coreg has been held due to possible low output.   - Continue scopolamine patch.  - Continue apixaban 5 mg twice a day.  - Holding dig with AKI  2. Acute on Chronic HFrEF, NICM - EF has been ~ 30-40% dating back to 2016. Had CMRI 2017 LVEF 20%. No LGE - Had cardiac PET 2023- no evidence for cardiac sarcoid or inflammatory. LVEF 38%.   - EF 8/24 appears 20-25%. In setting of rapid AF - Co-ox 63%. Holding digoxin and spiro for now with AKI - CVP 12. Continue diuresis w/ IV Lasix 80 mg x 1 today  - Resume GDMT as tolerated after DC-CV and after AKI improves    3. PVCs - on amio, 1-2 PVCs per hour.   4. OSA  - Continue CPAP nightly.  5. AKI on CKD Stage IIIb due to cardiorenal syndrome - Recent Scr 1.8 but previously creatinine baseline ~ 1.2.  - Admitted creatinine 1.9, bumped to 2.2. Trending back down 2.07 today  - Continue to hold spiro.  - monitor w/ diuresis   6. Hypokalemia - K 3.2, supp w/ diuresis   7. Hyponatremia - improving w/ diuresis  - Na 133  8. Constipation  - advance bowl regimen     Length of Stay: [address] PA-C  10/22/2022, 10:41 AM  Advanced Heart Failure Team Pager [phone] (M-F; 7a - 5p)  Please contact [facility] for night-coverage after hours (5p -7a ) and weekends on  [email or web address]  Patient seen and examined with the above-signed Advanced Practice Provider and/or Housestaff. I personally reviewed laboratory data, imaging studies and relevant notes. I independently examined the patient and formulated the important aspects of the plan. I have edited the note to reflect any of my changes or salient points. I have personally discussed the plan with the patient and/or family.  Remains in AF with RVR. Co-ox ok. CVP 12. SCr stable  On amio gtt at 30 No SOB.  General:  Sitting up in bed. No resp difficulty HEENT: normal Neck: supple. JVP 12. Carotids 2+ bilat; no bruits. No lymphadenopathy or thryomegaly appreciated. Cor:  Irreg tachy  Lungs: clear Abdomen: obese soft, nontender, nondistended. No hepatosplenomegaly. No bruits or masses. Good bowel sounds. Extremities: no cyanosis, clubbing, rash, tr edema Neuro: alert & orientedx3, cranial nerves grossly intact. moves all 4 extremities w/o difficulty. Affect pleasant  Increase amio back to 60.hr. Continue eliquis. Continue IV lasix Repeat DC-CV tomorrow.   Arvilla Meres, MD  3:16 PM

## 2022-10-22 NOTE — Progress Notes (Signed)
Applied TED hose per MD order  Lawson Radar, RN

## 2022-10-22 NOTE — Consult Note (Signed)
[facility] Customer service manager [facility]) Accountable Care Organization (ACO) [facility] Liaison Note  10/22/2022  Harold Park 09/28/1965 [id number]  Location: [facility] Liaison met patient at bedside at [facility].  Insurance: [employer]   Harold Park is a 57 y.o. male who is a Primary Care Patient of [name], Elberta Fortis, MD. The patient was screened for readmission hospitalization with noted low risk score for unplanned readmission risk with 1 IP in 6 months.  The patient was assessed for potential [facility]) Care Management service needs for post hospital transition for care coordination. Review of patient's electronic medical record reveals patient was admitted for Hypotension after procedure. Liaison spoke with pt at bedside concerning community case management services and available benefits. Pt receptive to a post hospital prevention readmission follow up call however no needs presented at this time.  Pt has a supportive spouse to assist with this ADLs.  No referral needed.  Plan: [facility] Liaison will continue to follow progress and disposition to asess for post hospital community care coordination/management needs.  Referral request for community care coordination: pending disposition.   [facility] Care Management/Population Health does not replace or interfere with any arrangements made by the Inpatient Transition of Care team.   For questions contact:   Elliot Cousin, RN, [facility] Liaison [place]   Population Health Office Hours MTWF  8:00 am-6:00 pm Off on Thursday [phone] mobile [phone] [Office toll free line] Office Hours are M-F 8:30 - 5 pm [email or web address]

## 2022-10-22 NOTE — Plan of Care (Signed)
  Problem: Clinical Measurements: Goal: Ability to maintain clinical measurements within normal limits will improve Outcome: Progressing Goal: Cardiovascular complication will be avoided Outcome: Progressing   

## 2022-10-22 NOTE — Progress Notes (Signed)
Mobility Specialist Progress Note:   10/22/22 1356  Mobility  Activity Ambulated with assistance in hallway  Level of Assistance Contact guard assist, steadying assist  Assistive Device  (IV Pole)  Distance Ambulated (ft) 100 ft  Activity Response Tolerated well  Mobility Referral Yes  $Mobility charge 1 Mobility  Mobility Specialist Start Time (ACUTE ONLY) 1335  Mobility Specialist Stop Time (ACUTE ONLY) 1345  Mobility Specialist Time Calculation (min) (ACUTE ONLY) 10 min   Pre Mobility: 109 HR  During Mobility: 124 HR   Post Mobility: 114 HR   Pt received in bed, agreeable to mobility. C/o slight lightheadedness during ambulation. Rated 3/10. D/t lightheadedness unable to ambulate greater distance than yesterday. Pt left EOB asymptomatic with call bell in reach and all needs met.   Leory Plowman  Mobility Specialist Please contact via [employer] office at [phone]

## 2022-10-23 ENCOUNTER — Inpatient Hospital Stay (HOSPITAL_COMMUNITY): Payer: BC Managed Care – PPO | Admitting: Anesthesiology

## 2022-10-23 ENCOUNTER — Encounter (HOSPITAL_COMMUNITY)
Admission: EM | Disposition: A | Discharge status: Home / Self Care | Payer: Self-pay | Source: Home / Self Care | Attending: Internal Medicine

## 2022-10-23 DIAGNOSIS — Z1152 Encounter for screening for COVID-19: Secondary | ICD-10-CM | POA: Diagnosis not present

## 2022-10-23 DIAGNOSIS — R570 Cardiogenic shock: Secondary | ICD-10-CM | POA: Diagnosis not present

## 2022-10-23 DIAGNOSIS — I9581 Postprocedural hypotension: Secondary | ICD-10-CM | POA: Diagnosis not present

## 2022-10-23 DIAGNOSIS — I4891 Unspecified atrial fibrillation: Secondary | ICD-10-CM | POA: Diagnosis not present

## 2022-10-23 DIAGNOSIS — J189 Pneumonia, unspecified organism: Secondary | ICD-10-CM | POA: Diagnosis not present

## 2022-10-23 DIAGNOSIS — I5023 Acute on chronic systolic (congestive) heart failure: Secondary | ICD-10-CM

## 2022-10-23 HISTORY — PX: CARDIOVERSION: SHX1299

## 2022-10-23 LAB — BASIC METABOLIC PANEL
Anion gap: 18 — ABNORMAL HIGH (ref 5–15)
BUN: 43 mg/dL — ABNORMAL HIGH (ref 6–20)
CO2: 20 mmol/L — ABNORMAL LOW (ref 22–32)
Calcium: 7.8 mg/dL — ABNORMAL LOW (ref 8.9–10.3)
Chloride: 91 mmol/L — ABNORMAL LOW (ref 98–111)
Creatinine, Ser: 2.11 mg/dL — ABNORMAL HIGH (ref 0.61–1.24)
GFR, Estimated: 36 mL/min — ABNORMAL LOW (ref >60)
Glucose, Bld: 306 mg/dL — ABNORMAL HIGH (ref 70–99)
Potassium: 3.8 mmol/L (ref 3.5–5.1)
Sodium: 129 mmol/L — ABNORMAL LOW (ref 135–145)

## 2022-10-23 LAB — GLUCOSE, CAPILLARY
Glucose-Capillary: 135 mg/dL — ABNORMAL HIGH (ref 70–99)
Glucose-Capillary: 116 mg/dL — ABNORMAL HIGH (ref 70–99)
Glucose-Capillary: 143 mg/dL — ABNORMAL HIGH (ref 70–99)
Glucose-Capillary: 125 mg/dL — ABNORMAL HIGH (ref 70–99)

## 2022-10-23 LAB — CBC
HCT: 51.4 % (ref 39.0–52.0)
Hemoglobin: 17.2 g/dL — ABNORMAL HIGH (ref 13.0–17.0)
MCH: 28.5 pg (ref 26.0–34.0)
MCHC: 33.5 g/dL (ref 30.0–36.0)
MCV: 85.1 fL (ref 80.0–100.0)
Platelets: 163 10*3/uL (ref 150–400)
RBC: 6.04 MIL/uL — ABNORMAL HIGH (ref 4.22–5.81)
RDW: 15.6 % — ABNORMAL HIGH (ref 11.5–15.5)
WBC: 14.3 10*3/uL — ABNORMAL HIGH (ref 4.0–10.5)
nRBC: 0 % (ref 0.0–0.2)

## 2022-10-23 LAB — MAGNESIUM: Magnesium: 2 mg/dL (ref 1.7–2.4)

## 2022-10-23 LAB — COOXEMETRY PANEL
Carboxyhemoglobin: 1.7 % — ABNORMAL HIGH (ref 0.5–1.5)
Methemoglobin: 1.7 % — ABNORMAL HIGH (ref 0.0–1.5)
O2 Saturation: 69.4 %
Total hemoglobin: 16.5 g/dL — ABNORMAL HIGH (ref 12.0–16.0)

## 2022-10-23 SURGERY — CARDIOVERSION
Anesthesia: General

## 2022-10-23 MED ORDER — LIDOCAINE HCL (CARDIAC) PF 100 MG/5ML IV SOSY
PREFILLED_SYRINGE | INTRAVENOUS | Status: DC | PRN
  Administered 2022-10-23: 100 mg via INTRAVENOUS

## 2022-10-23 MED ORDER — PHENYLEPHRINE 80 MCG/ML (10ML) SYRINGE FOR IV PUSH (FOR BLOOD PRESSURE SUPPORT)
PREFILLED_SYRINGE | INTRAVENOUS | Status: DC | PRN
Start: 2022-10-23 — End: 2022-10-23
  Administered 2022-10-23: 80 ug via INTRAVENOUS
  Administered 2022-10-23: 160 ug via INTRAVENOUS

## 2022-10-23 MED ORDER — POTASSIUM CHLORIDE CRYS ER 20 MEQ PO TBCR
40.0000 meq | EXTENDED_RELEASE_TABLET | Freq: Once | ORAL | Status: AC
  Administered 2022-10-23: 40 meq via ORAL
  Filled 2022-10-23: qty 2

## 2022-10-23 MED ORDER — PROPOFOL 10 MG/ML IV BOLUS
INTRAVENOUS | Status: DC | PRN
  Administered 2022-10-23: 100 mg via INTRAVENOUS

## 2022-10-23 SURGICAL SUPPLY — 1 items: ELECT DEFIB PAD ADLT CADENCE (PAD) ×1 IMPLANT

## 2022-10-23 NOTE — Anesthesia Preprocedure Evaluation (Signed)
Anesthesia Evaluation  Patient identified by MRN, date of birth, ID band Patient awake    Reviewed: Allergy & Precautions, H&P , NPO status , Patient's Chart, lab work & pertinent test results  Airway Mallampati: II  TM Distance: >3 FB Neck ROM: full    Dental no notable dental hx.    Pulmonary sleep apnea    Pulmonary exam normal breath sounds clear to auscultation       Cardiovascular hypertension, +CHF  Normal cardiovascular exam+ dysrhythmias Atrial Fibrillation  Rhythm:irregular Rate:Normal     Neuro/Psych  PSYCHIATRIC DISORDERS  Depression       GI/Hepatic   Endo/Other    Renal/GU Renal InsufficiencyRenal disease     Musculoskeletal   Abdominal   Peds  Hematology   Anesthesia Other Findings   Reproductive/Obstetrics                             Anesthesia Physical Anesthesia Plan  ASA: 3  Anesthesia Plan: General   Post-op Pain Management:    Induction: Intravenous  PONV Risk Score and Plan: 2 and Propofol infusion and Treatment may vary due to age or medical condition  Airway Management Planned: Nasal Cannula  Additional Equipment:   Intra-op Plan:   Post-operative Plan:   Informed Consent: I have reviewed the patients History and Physical, chart, labs and discussed the procedure including the risks, benefits and alternatives for the proposed anesthesia with the patient or authorized representative who has indicated his/her understanding and acceptance.     Dental advisory given  Plan Discussed with: CRNA, Anesthesiologist and Surgeon  Anesthesia Plan Comments:        Anesthesia Quick Evaluation

## 2022-10-23 NOTE — Anesthesia Postprocedure Evaluation (Signed)
Anesthesia Post Note  Patient: Harold Park  Procedure(s) Performed: CARDIOVERSION     Patient location during evaluation: PACU Anesthesia Type: General Level of consciousness: awake and alert Pain management: pain level controlled Vital Signs Assessment: post-procedure vital signs reviewed and stable Respiratory status: spontaneous breathing, nonlabored ventilation and respiratory function stable Cardiovascular status: blood pressure returned to baseline and stable Postop Assessment: no apparent nausea or vomiting Anesthetic complications: no   No notable events documented.  Last Vitals:  Vitals:   10/23/22 1405 10/23/22 1438  BP: 102/80 98/75  Pulse: (!) 103 91  Resp: (!) 30 20  Temp:  36.5 C  SpO2: 93% 94%    Last Pain:  Vitals:   10/23/22 1438  TempSrc: Oral  PainSc:                  Lowella Curb

## 2022-10-23 NOTE — H&P (View-Only) (Signed)
Advanced Heart Failure Rounding Note  PCP-Cardiologist: Armanda Magic, MD   Subjective:   Readmitted with shock post cardioversion for A fib RVR. GDMT held. POCUS EF 15%. Loading amio drip.   8/8 - Back  in A fib.  8/10- Diuretics/spiro held. (Last dose of IV lasix 8/9) 8/12: failed DCCV x2  CO-OX 69% Feels ok. Remains in AF with RVR on IV amio. For DC-CV today.     Objective:   Weight Range: (!) 141.8 kg Body mass index is 37.07 kg/m.   Vital Signs:   Temp:  [98.3 F (36.8 C)-98.5 F (36.9 C)] 98.4 F (36.9 C) (08/15 1151) Pulse Rate:  [104-118] 104 (08/15 1151) Resp:  [17-20] 20 (08/15 1151) BP: (96-134)/(71-117) 98/85 (08/15 1151) SpO2:  [92 %-96 %] 94 % (08/15 1151) Weight:  [141.8 kg] 141.8 kg (08/15 0526) Last BM Date : 10/22/22  Weight change: Filed Weights   10/20/22 0400 10/21/22 0326 10/23/22 0526  Weight: (!) 136.9 kg (!) 136.1 kg (!) 141.8 kg    Intake/Output:   Intake/Output Summary (Last 24 hours) at 10/23/2022 1204 Last data filed at 10/23/2022 1000 Gross per 24 hour  Intake 1807.6 ml  Output 2550 ml  Net -742.4 ml     Physical Exam   General:  Sitting up in bed No resp difficulty HEENT: normal Neck: supple. JVP 10 Carotids 2+ bilat; no bruits. No lymphadenopathy or thryomegaly appreciated. Cor: Irreg tachy  Lungs: clear Abdomen: soft, nontender, nondistended. No hepatosplenomegaly. No bruits or masses. Good bowel sounds. Extremities: no cyanosis, clubbing, rash, edema Neuro: alert & orientedx3, cranial nerves grossly intact. moves all 4 extremities w/o difficulty. Affect pleasant   Telemetry   A fib 100-120 Personally reviewed  Labs    CBC Recent Labs    10/23/22 0153  WBC 14.3*  HGB 17.2*  HCT 51.4  MCV 85.1  PLT 163    Basic Metabolic Panel Recent Labs    40/98/11 0549 10/23/22 0153  NA 133* 129*  K 3.2* 3.8  CL 96* 91*  CO2 25 20*  GLUCOSE 127* 306*  BUN 46* 43*  CREATININE 2.07* 2.11*  CALCIUM 8.4* 7.8*   MG 2.1 2.0   Liver Function Tests No results for input(s): "AST", "ALT", "ALKPHOS", "BILITOT", "PROT", "ALBUMIN" in the last 72 hours.  No results for input(s): "LIPASE", "AMYLASE" in the last 72 hours. Cardiac Enzymes No results for input(s): "CKTOTAL", "CKMB", "CKMBINDEX", "TROPONINI" in the last 72 hours.  BNP: BNP (last 3 results) Recent Labs    10/01/22 1020 10/15/22 0126  BNP 822.7* 777.2*    ProBNP (last 3 results) No results for input(s): "PROBNP" in the last 8760 hours.   D-Dimer No results for input(s): "DDIMER" in the last 72 hours. Hemoglobin A1C Recent Labs    10/21/22 1053  HGBA1C 6.4*   Fasting Lipid Panel No results for input(s): "CHOL", "HDL", "LDLCALC", "TRIG", "CHOLHDL", "LDLDIRECT" in the last 72 hours. Thyroid Function Tests No results for input(s): "TSH", "T4TOTAL", "T3FREE", "THYROIDAB" in the last 72 hours.  Invalid input(s): "FREET3"  Other results:  Imaging    No results found.   Medications:   Scheduled Medications:  apixaban  5 mg Oral BID   Chlorhexidine Gluconate Cloth  6 each Topical Daily   DULoxetine  20 mg Oral Daily   insulin aspart  0-5 Units Subcutaneous QHS   insulin aspart  0-9 Units Subcutaneous TID WC   metoCLOPramide (REGLAN) injection  10 mg Intravenous Q8H   polyethylene glycol  17 g Oral Daily   scopolamine  1 patch Transdermal Q72H   senna-docusate  1 tablet Oral BID   sodium chloride flush  10-40 mL Intracatheter Q12H   sodium chloride flush  3 mL Intravenous Q12H    Infusions:  sodium chloride Stopped (10/20/22 2028)   sodium chloride     sodium chloride     amiodarone 60 mg/hr (10/23/22 0944)    PRN Medications: sodium chloride, acetaminophen, ondansetron (ZOFRAN) IV, sodium chloride flush, sodium chloride flush  Patient Profile  Harold Park is a 57 y.o. male with NICM with EF 30-35%, chronic systolic CHF, parox A-Flutter with RVR and HTN.   Admitted with shock following DCCV on 10/14/2022.   Assessment/Plan   1.  Shock, post cardioversion for A Fib RVR - Recurrent A fib RVR 09/2022 and underwent successful cardioversion 7/25. Went back in A fib RVR and had cardioversion in the ED. Post cardioversion developed shock.   - Recurrent AF 8/8 - Now on IV amio (tolerating with some nausea) at 60 mg/hr  - Remains in A fib RVR. Failed DCCV x2  - EP has seen. Plan eventual ablation  - Coreg has been held due to possible low output.   - Continue scopolamine patch.  - Continue apixaban 5 mg twice a day.  - Holding dig with AKI - Plan repeat DC-CV today after nearly 10g load of IV amio   2. Acute on Chronic HFrEF, NICM - EF has been ~ 30-40% dating back to 2016. Had CMRI 2017 LVEF 20%. No LGE - Had cardiac PET 2023- no evidence for cardiac sarcoid or inflammatory. LVEF 38%.   - EF 8/24 appears 20-25%. In setting of rapid AF - Co-ox 69%. Holding digoxin and spiro for now with AKI - CVP 12-14  Diurese as needed - Resume GDMT as tolerated after DC-CV and after AKI improves    3. PVCs - on amio, 1-2 PVCs per hour.   4. OSA  - Continue CPAP nightly.  5. AKI on CKD Stage IIIb due to cardiorenal syndrome - Recent Scr 1.8 but previously creatinine baseline ~ 1.2.  - Admitted creatinine 1.9, bumped to 2.2. Stable at 2.1 today - Continue to hold spiro.  - monitor w/ diuresis   6. Hypokalemia - K 3.8, supp w/ diuresis   7. Hyponatremia - improving w/ diuresis  - Na 129 - Restrict FW    Length of Stay: 7  Arvilla Meres, MD  12:28 PM   Advanced Heart Failure Team Pager [phone] (M-F; 7a - 5p)  Please contact [facility] for night-coverage after hours (5p -7a ) and weekends on [email or web address]

## 2022-10-23 NOTE — Transfer of Care (Signed)
Immediate Anesthesia Transfer of Care Note  Patient: Harold Park  Procedure(s) Performed: CARDIOVERSION  Patient Location: Cath Lab  Anesthesia Type:MAC  Level of Consciousness: drowsy  Airway & Oxygen Therapy: Patient Spontanous Breathing and Patient connected to nasal cannula oxygen  Post-op Assessment: Report given to RN and Post -op Vital signs reviewed and stable  Post vital signs: Reviewed and stable  Last Vitals:  Vitals Value Taken Time  BP    Temp    Pulse    Resp    SpO2      Last Pain:  Vitals:   10/23/22 1221  TempSrc: Temporal  PainSc: 0-No pain      Patients Stated Pain Goal: 0 (10/17/22 2227)  Complications: No notable events documented.

## 2022-10-23 NOTE — Interval H&P Note (Signed)
History and Physical Interval Note:  10/23/2022 12:48 PM  Clance Bendall  has presented today for surgery, with the diagnosis of afib.  The various methods of treatment have been discussed with the patient and family. After consideration of risks, benefits and other options for treatment, the patient has consented to  Procedure(s): CARDIOVERSION (N/A) as a surgical intervention.  The patient's history has been reviewed, patient examined, no change in status, stable for surgery.  I have reviewed the patient's chart and labs.  Questions were answered to the patient's satisfaction.      

## 2022-10-23 NOTE — Progress Notes (Signed)
Advanced Heart Failure Rounding Note  PCP-Cardiologist: Armanda Magic, MD   Subjective:   Readmitted with shock post cardioversion for A fib RVR. GDMT held. POCUS EF 15%. Loading amio drip.   8/8 - Back  in A fib.  8/10- Diuretics/spiro held. (Last dose of IV lasix 8/9) 8/12: failed DCCV x2  CO-OX 69% Feels ok. Remains in AF with RVR on IV amio. For DC-CV today.     Objective:   Weight Range: (!) 141.8 kg Body mass index is 37.07 kg/m.   Vital Signs:   Temp:  [98.3 F (36.8 C)-98.5 F (36.9 C)] 98.4 F (36.9 C) (08/15 1151) Pulse Rate:  [104-118] 104 (08/15 1151) Resp:  [17-20] 20 (08/15 1151) BP: (96-134)/(71-117) 98/85 (08/15 1151) SpO2:  [92 %-96 %] 94 % (08/15 1151) Weight:  [141.8 kg] 141.8 kg (08/15 0526) Last BM Date : 10/22/22  Weight change: Filed Weights   10/20/22 0400 10/21/22 0326 10/23/22 0526  Weight: (!) 136.9 kg (!) 136.1 kg (!) 141.8 kg    Intake/Output:   Intake/Output Summary (Last 24 hours) at 10/23/2022 1204 Last data filed at 10/23/2022 1000 Gross per 24 hour  Intake 1807.6 ml  Output 2550 ml  Net -742.4 ml     Physical Exam   General:  Sitting up in bed No resp difficulty HEENT: normal Neck: supple. JVP 10 Carotids 2+ bilat; no bruits. No lymphadenopathy or thryomegaly appreciated. Cor: Irreg tachy  Lungs: clear Abdomen: soft, nontender, nondistended. No hepatosplenomegaly. No bruits or masses. Good bowel sounds. Extremities: no cyanosis, clubbing, rash, edema Neuro: alert & orientedx3, cranial nerves grossly intact. moves all 4 extremities w/o difficulty. Affect pleasant   Telemetry   A fib 100-120 Personally reviewed  Labs    CBC Recent Labs    10/23/22 0153  WBC 14.3*  HGB 17.2*  HCT 51.4  MCV 85.1  PLT 163    Basic Metabolic Panel Recent Labs    40/98/11 0549 10/23/22 0153  NA 133* 129*  K 3.2* 3.8  CL 96* 91*  CO2 25 20*  GLUCOSE 127* 306*  BUN 46* 43*  CREATININE 2.07* 2.11*  CALCIUM 8.4* 7.8*   MG 2.1 2.0   Liver Function Tests No results for input(s): "AST", "ALT", "ALKPHOS", "BILITOT", "PROT", "ALBUMIN" in the last 72 hours.  No results for input(s): "LIPASE", "AMYLASE" in the last 72 hours. Cardiac Enzymes No results for input(s): "CKTOTAL", "CKMB", "CKMBINDEX", "TROPONINI" in the last 72 hours.  BNP: BNP (last 3 results) Recent Labs    10/01/22 1020 10/15/22 0126  BNP 822.7* 777.2*    ProBNP (last 3 results) No results for input(s): "PROBNP" in the last 8760 hours.   D-Dimer No results for input(s): "DDIMER" in the last 72 hours. Hemoglobin A1C Recent Labs    10/21/22 1053  HGBA1C 6.4*   Fasting Lipid Panel No results for input(s): "CHOL", "HDL", "LDLCALC", "TRIG", "CHOLHDL", "LDLDIRECT" in the last 72 hours. Thyroid Function Tests No results for input(s): "TSH", "T4TOTAL", "T3FREE", "THYROIDAB" in the last 72 hours.  Invalid input(s): "FREET3"  Other results:  Imaging    No results found.   Medications:   Scheduled Medications:  apixaban  5 mg Oral BID   Chlorhexidine Gluconate Cloth  6 each Topical Daily   DULoxetine  20 mg Oral Daily   insulin aspart  0-5 Units Subcutaneous QHS   insulin aspart  0-9 Units Subcutaneous TID WC   metoCLOPramide (REGLAN) injection  10 mg Intravenous Q8H   polyethylene glycol  17 g Oral Daily   scopolamine  1 patch Transdermal Q72H   senna-docusate  1 tablet Oral BID   sodium chloride flush  10-40 mL Intracatheter Q12H   sodium chloride flush  3 mL Intravenous Q12H    Infusions:  sodium chloride Stopped (10/20/22 2028)   sodium chloride     sodium chloride     amiodarone 60 mg/hr (10/23/22 0944)    PRN Medications: sodium chloride, acetaminophen, ondansetron (ZOFRAN) IV, sodium chloride flush, sodium chloride flush  Patient Profile  Harold Park is a 57 y.o. male with NICM with EF 30-35%, chronic systolic CHF, parox A-Flutter with RVR and HTN.   Admitted with shock following DCCV on 10/14/2022.   Assessment/Plan   1.  Shock, post cardioversion for A Fib RVR - Recurrent A fib RVR 09/2022 and underwent successful cardioversion 7/25. Went back in A fib RVR and had cardioversion in the ED. Post cardioversion developed shock.   - Recurrent AF 8/8 - Now on IV amio (tolerating with some nausea) at 60 mg/hr  - Remains in A fib RVR. Failed DCCV x2  - EP has seen. Plan eventual ablation  - Coreg has been held due to possible low output.   - Continue scopolamine patch.  - Continue apixaban 5 mg twice a day.  - Holding dig with AKI - Plan repeat DC-CV today after nearly 10g load of IV amio   2. Acute on Chronic HFrEF, NICM - EF has been ~ 30-40% dating back to 2016. Had CMRI 2017 LVEF 20%. No LGE - Had cardiac PET 2023- no evidence for cardiac sarcoid or inflammatory. LVEF 38%.   - EF 8/24 appears 20-25%. In setting of rapid AF - Co-ox 69%. Holding digoxin and spiro for now with AKI - CVP 12-14  Diurese as needed - Resume GDMT as tolerated after DC-CV and after AKI improves    3. PVCs - on amio, 1-2 PVCs per hour.   4. OSA  - Continue CPAP nightly.  5. AKI on CKD Stage IIIb due to cardiorenal syndrome - Recent Scr 1.8 but previously creatinine baseline ~ 1.2.  - Admitted creatinine 1.9, bumped to 2.2. Stable at 2.1 today - Continue to hold spiro.  - monitor w/ diuresis   6. Hypokalemia - K 3.8, supp w/ diuresis   7. Hyponatremia - improving w/ diuresis  - Na 129 - Restrict FW    Length of Stay: 7  Arvilla Meres, MD  12:28 PM   Advanced Heart Failure Team Pager [phone] (M-F; 7a - 5p)  Please contact [facility] for night-coverage after hours (5p -7a ) and weekends on [email or web address]

## 2022-10-23 NOTE — CV Procedure (Signed)
    DIRECT CURRENT CARDIOVERSION  NAME:  Harold Park   MRN: [id number] DOB:  November 20, 1965   ADMIT DATE: 10/14/2022   INDICATIONS: Atrial fibrillation    PROCEDURE:   Informed consent was obtained prior to the procedure. The risks, benefits and alternatives for the procedure were discussed and the patient comprehended these risks. Once an appropriate time out was taken, the patient had the defibrillator pads placed in the anterior and posterior position. The patient then underwent sedation by the anesthesia service. Once an appropriate level of sedation was achieved, the patient received a single biphasic, synchronized 250J shock with prompt conversion to sinus rhythm. No apparent complications.  Arvilla Meres, MD  1:46 PM

## 2022-10-23 NOTE — Progress Notes (Signed)
Pt came back to rm 8 from cath lab. Reinitiated tele. VSS. Call bell within reach.   Lavenia Atlas, RN

## 2022-10-24 ENCOUNTER — Encounter (HOSPITAL_COMMUNITY): Payer: Self-pay | Admitting: Internal Medicine

## 2022-10-24 DIAGNOSIS — I9581 Postprocedural hypotension: Secondary | ICD-10-CM | POA: Diagnosis not present

## 2022-10-24 LAB — GLUCOSE, CAPILLARY
Glucose-Capillary: 126 mg/dL — ABNORMAL HIGH (ref 70–99)
Glucose-Capillary: 112 mg/dL — ABNORMAL HIGH (ref 70–99)

## 2022-10-24 LAB — BASIC METABOLIC PANEL
Anion gap: 11 (ref 5–15)
BUN: 42 mg/dL — ABNORMAL HIGH (ref 6–20)
CO2: 26 mmol/L (ref 22–32)
Calcium: 8.5 mg/dL — ABNORMAL LOW (ref 8.9–10.3)
Chloride: 94 mmol/L — ABNORMAL LOW (ref 98–111)
Creatinine, Ser: 1.92 mg/dL — ABNORMAL HIGH (ref 0.61–1.24)
GFR, Estimated: 40 mL/min — ABNORMAL LOW (ref >60)
Glucose, Bld: 112 mg/dL — ABNORMAL HIGH (ref 70–99)
Potassium: 3.7 mmol/L (ref 3.5–5.1)
Sodium: 131 mmol/L — ABNORMAL LOW (ref 135–145)

## 2022-10-24 LAB — CBC
HCT: 52.8 % — ABNORMAL HIGH (ref 39.0–52.0)
Hemoglobin: 17.7 g/dL — ABNORMAL HIGH (ref 13.0–17.0)
MCH: 28.1 pg (ref 26.0–34.0)
MCHC: 33.5 g/dL (ref 30.0–36.0)
MCV: 83.9 fL (ref 80.0–100.0)
Platelets: 181 10*3/uL (ref 150–400)
RBC: 6.29 MIL/uL — ABNORMAL HIGH (ref 4.22–5.81)
RDW: 16.2 % — ABNORMAL HIGH (ref 11.5–15.5)
WBC: 12.4 10*3/uL — ABNORMAL HIGH (ref 4.0–10.5)
nRBC: 0 % (ref 0.0–0.2)

## 2022-10-24 LAB — COOXEMETRY PANEL
Carboxyhemoglobin: 2.1 % — ABNORMAL HIGH (ref 0.5–1.5)
Methemoglobin: <0.7 % (ref 0.0–1.5)
O2 Saturation: 68.4 %
Total hemoglobin: 18.2 g/dL — ABNORMAL HIGH (ref 12.0–16.0)

## 2022-10-24 LAB — MAGNESIUM: Magnesium: 2.3 mg/dL (ref 1.7–2.4)

## 2022-10-24 MED ORDER — AMIODARONE HCL 200 MG PO TABS: ORAL_TABLET | ORAL | 6 refills | Status: DC

## 2022-10-24 MED ORDER — TORSEMIDE 20 MG PO TABS
20.0000 mg | ORAL_TABLET | Freq: Every day | ORAL | Status: DC
  Administered 2022-10-24: 20 mg via ORAL
  Filled 2022-10-24: qty 1

## 2022-10-24 MED ORDER — AMIODARONE HCL 200 MG PO TABS
400.0000 mg | ORAL_TABLET | Freq: Two times a day (BID) | ORAL | Status: DC
  Administered 2022-10-24: 400 mg via ORAL
  Filled 2022-10-24: qty 2

## 2022-10-24 MED ORDER — ONDANSETRON HCL 4 MG PO TABS: 4.0000 mg | ORAL_TABLET | Freq: Three times a day (TID) | ORAL | 1 refills | Status: AC | PRN

## 2022-10-24 MED ORDER — POTASSIUM CHLORIDE CRYS ER 20 MEQ PO TBCR
40.0000 meq | EXTENDED_RELEASE_TABLET | Freq: Once | ORAL | Status: AC
  Administered 2022-10-24: 40 meq via ORAL
  Filled 2022-10-24: qty 2

## 2022-10-24 MED ORDER — METOCLOPRAMIDE HCL 5 MG PO TABS: 5.0000 mg | ORAL_TABLET | Freq: Three times a day (TID) | ORAL | 1 refills | Status: DC

## 2022-10-24 MED ORDER — SCOPOLAMINE 1 MG/3DAYS TD PT72: 1.0000 | MEDICATED_PATCH | TRANSDERMAL | 12 refills | Status: DC

## 2022-10-24 NOTE — Progress Notes (Addendum)
Mobility Specialist Progress Note:   10/24/22 1249  Mobility  Activity Ambulated with assistance in hallway  Level of Assistance Contact guard assist, steadying assist  Assistive Device  (IV Pole)  Distance Ambulated (ft) 510 ft  Activity Response Tolerated well  Mobility Referral Yes  $Mobility charge 1 Mobility  Mobility Specialist Start Time (ACUTE ONLY) 1220  Mobility Specialist Stop Time (ACUTE ONLY) 1242  Mobility Specialist Time Calculation (min) (ACUTE ONLY) 22 min    Pre Mobility: 92 HR , 103/89 BP  During Mobility: 100 HR  Post Mobility: 93 HR   Pt received in bed, agreeable to mobility. Denied any feelings of lightheadedness or discomfort during ambulation, asymptomatic throughout. Pt returned to bed with call bell in reach and all needs met.   Leory Plowman  Mobility Specialist Please contact via [employer] office at [phone]

## 2022-10-24 NOTE — TOC Progression Note (Signed)
Transition of Care [facility]) - Progression Note    Patient Details  Name: Harold Park MRN: [id number] Date of Birth: November 21, 1965  [facility]) CM/SW Contact  Elliot Cousin, RN Phone Number: [phone] 10/24/2022, 4:23 PM  Clinical Narrative:   Pt has scale at home for daily weight. Adhere low sodium heart healthy diet. Pt family will provide transportation home. Provided pt with information on how to apply for his SS Disability.   PCP appt scheduled for 10/28/2022 at 1115 am.     Expected Discharge Plan: Home/Self Care Barriers to Discharge: No Barriers Identified  Expected Discharge Plan and Services   Discharge Planning Services: CM Consult   Living arrangements for the past 2 months: Single Family Home Expected Discharge Date: 10/24/22                                     Social Determinants of Health (SDOH) Interventions SDOH Screenings   Food Insecurity: No Food Insecurity (10/15/2022)  Housing: Low Risk  (10/15/2022)  Transportation Needs: No Transportation Needs (10/15/2022)  Utilities: Not At Risk (10/15/2022)  Depression (PHQ2-9): Low Risk  (11/29/2021)  Tobacco Use: Low Risk  (10/14/2022)    Readmission Risk Interventions     No data to display

## 2022-10-24 NOTE — Progress Notes (Signed)
Arrived at patient room. Educated on PICC line removal procedure. HOB less than 45* Pt held breath upon line removal. Pressure held for 5 min. No s/sx of bleeding. Instructed patient to monitor and report if noted bleeding and to apply pressure directly to site. Instructed to remain in bed for 24 hrs keeping pressure drsg CDI. Instructed to remain in bed for . Notified nurse PICC pulled. Tomasita Morrow, RN VAST

## 2022-10-24 NOTE — Progress Notes (Addendum)
Advanced Heart Failure Rounding Note  PCP-Cardiologist: Armanda Magic, MD   Subjective:   Readmitted with shock post cardioversion for A fib RVR. GDMT held. POCUS EF 15%. Loading amio drip.   8/8 - Back  in A fib.  8/10- Diuretics/spiro held. (Last dose of IV lasix 8/9) 8/12: failed DCCV x2 8/15: Successful DC-CV  Remains in NSR on amio 60 mg per hour.    Denies SOB. Wants to go home.      Objective:   Weight Range: (!) 142.6 kg Body mass index is 37.27 kg/m.   Vital Signs:   Temp:  [97.5 F (36.4 C)-98.8 F (37.1 C)] 98.8 F (37.1 C) (08/16 0759) Pulse Rate:  [83-107] 100 (08/16 0759) Resp:  [18-30] 20 (08/16 0759) BP: (90-138)/(75-91) 138/88 (08/16 0759) SpO2:  [73 %-96 %] 96 % (08/16 0759) Weight:  [142.6 kg] 142.6 kg (08/16 0405) Last BM Date : 10/22/22  Weight change: Filed Weights   10/21/22 0326 10/23/22 0526 10/24/22 0405  Weight: (!) 136.1 kg (!) 141.8 kg (!) 142.6 kg    Intake/Output:   Intake/Output Summary (Last 24 hours) at 10/24/2022 1012 Last data filed at 10/24/2022 0800 Gross per 24 hour  Intake 630 ml  Output 1100 ml  Net -470 ml     Physical Exam   General:  Sitting on the side of the bed. . No resp difficulty HEENT: normal Neck: supple. JVP 10-11  . Carotids 2+ bilat; no bruits. No lymphadenopathy or thryomegaly appreciated. Cor: PMI nondisplaced. Regular rate & rhythm. No rubs, gallops or murmurs. Lungs: clear Abdomen: soft, nontender, nondistended. No hepatosplenomegaly. No bruits or masses. Good bowel sounds. Extremities: no cyanosis, clubbing, rash, edema Neuro: alert & orientedx3, cranial nerves grossly intact. moves all 4 extremities w/o difficulty. Affect pleasant  Telemetry   SR 90s 1-2 PVCs per hour.   Labs    CBC Recent Labs    10/23/22 0153 10/24/22 0609  WBC 14.3* 12.4*  HGB 17.2* 17.7*  HCT 51.4 52.8*  MCV 85.1 83.9  PLT 163 181    Basic Metabolic Panel Recent Labs    16/10/96 0153 10/24/22 0609   NA 129* 131*  K 3.8 3.7  CL 91* 94*  CO2 20* 26  GLUCOSE 306* 112*  BUN 43* 42*  CREATININE 2.11* 1.92*  CALCIUM 7.8* 8.5*  MG 2.0 2.3   Liver Function Tests No results for input(s): "AST", "ALT", "ALKPHOS", "BILITOT", "PROT", "ALBUMIN" in the last 72 hours.  No results for input(s): "LIPASE", "AMYLASE" in the last 72 hours. Cardiac Enzymes No results for input(s): "CKTOTAL", "CKMB", "CKMBINDEX", "TROPONINI" in the last 72 hours.  BNP: BNP (last 3 results) Recent Labs    10/01/22 1020 10/15/22 0126  BNP 822.7* 777.2*    ProBNP (last 3 results) No results for input(s): "PROBNP" in the last 8760 hours.   D-Dimer No results for input(s): "DDIMER" in the last 72 hours. Hemoglobin A1C Recent Labs    10/21/22 1053  HGBA1C 6.4*   Fasting Lipid Panel No results for input(s): "CHOL", "HDL", "LDLCALC", "TRIG", "CHOLHDL", "LDLDIRECT" in the last 72 hours. Thyroid Function Tests No results for input(s): "TSH", "T4TOTAL", "T3FREE", "THYROIDAB" in the last 72 hours.  Invalid input(s): "FREET3"  Other results:  Imaging    EP STUDY  Result Date: 10/23/2022 See surgical note for result.    Medications:   Scheduled Medications:  apixaban  5 mg Oral BID   Chlorhexidine Gluconate Cloth  6 each Topical Daily   DULoxetine  20 mg Oral Daily   insulin aspart  0-5 Units Subcutaneous QHS   insulin aspart  0-9 Units Subcutaneous TID WC   metoCLOPramide (REGLAN) injection  10 mg Intravenous Q8H   polyethylene glycol  17 g Oral Daily   scopolamine  1 patch Transdermal Q72H   senna-docusate  1 tablet Oral BID   sodium chloride flush  10-40 mL Intracatheter Q12H   sodium chloride flush  3 mL Intravenous Q12H    Infusions:  sodium chloride Stopped (10/20/22 2028)   amiodarone 60 mg/hr (10/24/22 0957)    PRN Medications: sodium chloride, acetaminophen, ondansetron (ZOFRAN) IV, sodium chloride flush, sodium chloride flush  Patient Profile  Harold Park is a 57 y.o.  male with NICM with EF 30-35%, chronic systolic CHF, parox A-Flutter with RVR and HTN.   Admitted with shock following DCCV on 10/14/2022.  Assessment/Plan   1.  Shock, post cardioversion for A Fib RVR - Recurrent A fib RVR 09/2022 and underwent successful cardioversion 7/25. Went back in A fib RVR and had cardioversion in the ED. Post cardioversion developed shock.   - Recurrent AF 8/8. Failed cardioversion x2. Reloaded on IV amio.  -  EP has seen. Plan eventual ablation  - Coreg has been held due to possible low output.   - Continue scopolamine patch.  - Continue apixaban 5 mg twice a day.  - Holding dig with AKI - S/P cardioversion. Back in SR. Once ok for d/c. Stop amio drip. Place on amio 400 mg twice a day x7 days then 200 mg twice a day.   2. Acute on Chronic HFrEF, NICM - EF has been ~ 30-40% dating back to 2016. Had CMRI 2017 LVEF 20%. No LGE - Had cardiac PET 2023- no evidence for cardiac sarcoid or inflammatory. LVEF 38%.   - EF 8/24 appears 20-25% and RV moderately reduced.  In setting of rapid AF - Co-ox 69%. Holding digoxin and spiro for now with AKI - CVP 12-14 . Start torsemide 20 mg daily.  - Resume GDMT as tolerated after DC-CV and after AKI improves   3. PVCs - on amio, 1-2 PVCs per hour.   4. OSA  - Continue CPAP nightly.  5. AKI on CKD Stage IIIb due to cardiorenal syndrome - Recent Scr 1.8 but previously creatinine baseline ~ 1.2.  - Admitted creatinine 1.9, bumped to 2.2. Stable at 1.9 today - Continue to hold spiro.  - monitor w/ diuresis   6. Hypokalemia - K 3.7.   7. Hyponatremia - improving w/ diuresis  - Na 131 - Restrict FW    Length of Stay: 8  Amy Clegg, NP  10:12 AM   Advanced Heart Failure Team Pager [phone] (M-F; 7a - 5p)  Please contact [facility] for night-coverage after hours (5p -7a ) and weekends on [email or web address]  Patient seen and examined with the above-signed Advanced Practice Provider and/or Housestaff. I personally  reviewed laboratory data, imaging studies and relevant notes. I independently examined the patient and formulated the important aspects of the plan. I have edited the note to reflect any of my changes or salient points. I have personally discussed the plan with the patient and/or family.  Remains in NSR after DC-CV yesterday. Denies SOB. Wants to go home  General:  Well appearing. No resp difficulty HEENT: normal Neck: supple. JVP 10. Carotids 2+ bilat; no bruits. No lymphadenopathy or thryomegaly appreciated. Cor: PMI nondisplaced. Regular rate & rhythm. No rubs, gallops or murmurs. Lungs: clear Abdomen: obese  soft, nontender, nondistended. No hepatosplenomegaly. No bruits or masses. Good bowel sounds. Extremities: no cyanosis, clubbing, rash, edema Neuro: alert & orientedx3, cranial nerves grossly intact. moves all 4 extremities w/o difficulty. Affect pleasant  Maintaining NSR after DC-V. I was planning to keep him one more day to watch his rhythm but he would like to go home and is otherwise stable. He will follow HRs closely at home. Continue Eliquis. See back in clinic next week. Repeat echo 1 month to reassess LV function. Eventual AF ablation.   Arvilla Meres, MD  11:13 PM

## 2022-10-24 NOTE — Progress Notes (Signed)
Patient given discharge instructions, medication list and follow up appointments.All questions were answered. Patient verbalized understanding.  Telemetry has been removed.   IV team has pulled Picc line and patient is on bed rest for 30 minutes. Will discharge home once bed rest complete. Azzam Mehra, Randall An RN

## 2022-10-27 ENCOUNTER — Emergency Department (HOSPITAL_BASED_OUTPATIENT_CLINIC_OR_DEPARTMENT_OTHER): Payer: BC Managed Care – PPO

## 2022-10-27 ENCOUNTER — Encounter (HOSPITAL_BASED_OUTPATIENT_CLINIC_OR_DEPARTMENT_OTHER): Payer: Self-pay | Admitting: Emergency Medicine

## 2022-10-27 ENCOUNTER — Inpatient Hospital Stay (HOSPITAL_COMMUNITY): Payer: BC Managed Care – PPO

## 2022-10-27 ENCOUNTER — Inpatient Hospital Stay (HOSPITAL_COMMUNITY)
Admission: EM | Admit: 2022-10-27 | Discharge: 2022-11-08 | Disposition: A | Discharge status: Home / Self Care | Payer: BC Managed Care – PPO | Source: Home / Self Care | Attending: Internal Medicine | Admitting: Internal Medicine

## 2022-10-27 ENCOUNTER — Telehealth (HOSPITAL_COMMUNITY): Payer: Self-pay | Admitting: Cardiology

## 2022-10-27 ENCOUNTER — Other Ambulatory Visit: Payer: Self-pay

## 2022-10-27 DIAGNOSIS — E875 Hyperkalemia: Secondary | ICD-10-CM | POA: Diagnosis present

## 2022-10-27 DIAGNOSIS — K5900 Constipation, unspecified: Secondary | ICD-10-CM | POA: Diagnosis present

## 2022-10-27 DIAGNOSIS — E1165 Type 2 diabetes mellitus with hyperglycemia: Secondary | ICD-10-CM | POA: Diagnosis present

## 2022-10-27 DIAGNOSIS — J9601 Acute respiratory failure with hypoxia: Secondary | ICD-10-CM | POA: Diagnosis present

## 2022-10-27 DIAGNOSIS — Z79899 Other long term (current) drug therapy: Secondary | ICD-10-CM

## 2022-10-27 DIAGNOSIS — Z888 Allergy status to other drugs, medicaments and biological substances status: Secondary | ICD-10-CM

## 2022-10-27 DIAGNOSIS — Z803 Family history of malignant neoplasm of breast: Secondary | ICD-10-CM

## 2022-10-27 DIAGNOSIS — Z91030 Bee allergy status: Secondary | ICD-10-CM

## 2022-10-27 DIAGNOSIS — E876 Hypokalemia: Secondary | ICD-10-CM | POA: Diagnosis present

## 2022-10-27 DIAGNOSIS — I484 Atypical atrial flutter: Secondary | ICD-10-CM | POA: Diagnosis present

## 2022-10-27 DIAGNOSIS — I5023 Acute on chronic systolic (congestive) heart failure: Secondary | ICD-10-CM | POA: Diagnosis present

## 2022-10-27 DIAGNOSIS — I4819 Other persistent atrial fibrillation: Secondary | ICD-10-CM | POA: Diagnosis present

## 2022-10-27 DIAGNOSIS — N1832 Chronic kidney disease, stage 3b: Secondary | ICD-10-CM | POA: Diagnosis present

## 2022-10-27 DIAGNOSIS — R0902 Hypoxemia: Secondary | ICD-10-CM

## 2022-10-27 DIAGNOSIS — Z8249 Family history of ischemic heart disease and other diseases of the circulatory system: Secondary | ICD-10-CM

## 2022-10-27 DIAGNOSIS — G4733 Obstructive sleep apnea (adult) (pediatric): Secondary | ICD-10-CM | POA: Diagnosis present

## 2022-10-27 DIAGNOSIS — I5021 Acute systolic (congestive) heart failure: Secondary | ICD-10-CM | POA: Diagnosis not present

## 2022-10-27 DIAGNOSIS — Z7901 Long term (current) use of anticoagulants: Secondary | ICD-10-CM

## 2022-10-27 DIAGNOSIS — Z7984 Long term (current) use of oral hypoglycemic drugs: Secondary | ICD-10-CM

## 2022-10-27 DIAGNOSIS — E1122 Type 2 diabetes mellitus with diabetic chronic kidney disease: Secondary | ICD-10-CM | POA: Diagnosis present

## 2022-10-27 DIAGNOSIS — I4589 Other specified conduction disorders: Secondary | ICD-10-CM | POA: Diagnosis present

## 2022-10-27 DIAGNOSIS — I472 Ventricular tachycardia, unspecified: Secondary | ICD-10-CM | POA: Diagnosis present

## 2022-10-27 DIAGNOSIS — J189 Pneumonia, unspecified organism: Secondary | ICD-10-CM | POA: Diagnosis present

## 2022-10-27 DIAGNOSIS — E669 Obesity, unspecified: Secondary | ICD-10-CM | POA: Diagnosis present

## 2022-10-27 DIAGNOSIS — E8770 Fluid overload, unspecified: Secondary | ICD-10-CM | POA: Diagnosis not present

## 2022-10-27 DIAGNOSIS — D869 Sarcoidosis, unspecified: Secondary | ICD-10-CM | POA: Diagnosis present

## 2022-10-27 DIAGNOSIS — I4510 Unspecified right bundle-branch block: Secondary | ICD-10-CM | POA: Diagnosis present

## 2022-10-27 DIAGNOSIS — E861 Hypovolemia: Secondary | ICD-10-CM | POA: Diagnosis present

## 2022-10-27 DIAGNOSIS — D860 Sarcoidosis of lung: Secondary | ICD-10-CM | POA: Diagnosis present

## 2022-10-27 DIAGNOSIS — Z1152 Encounter for screening for COVID-19: Secondary | ICD-10-CM

## 2022-10-27 DIAGNOSIS — I428 Other cardiomyopathies: Secondary | ICD-10-CM | POA: Diagnosis present

## 2022-10-27 DIAGNOSIS — Z9081 Acquired absence of spleen: Secondary | ICD-10-CM

## 2022-10-27 DIAGNOSIS — I130 Hypertensive heart and chronic kidney disease with heart failure and stage 1 through stage 4 chronic kidney disease, or unspecified chronic kidney disease: Principal | ICD-10-CM | POA: Diagnosis present

## 2022-10-27 DIAGNOSIS — D751 Secondary polycythemia: Secondary | ICD-10-CM | POA: Diagnosis present

## 2022-10-27 DIAGNOSIS — E871 Hypo-osmolality and hyponatremia: Secondary | ICD-10-CM | POA: Diagnosis present

## 2022-10-27 DIAGNOSIS — R918 Other nonspecific abnormal finding of lung field: Secondary | ICD-10-CM | POA: Diagnosis not present

## 2022-10-27 DIAGNOSIS — R570 Cardiogenic shock: Secondary | ICD-10-CM | POA: Diagnosis present

## 2022-10-27 DIAGNOSIS — N179 Acute kidney failure, unspecified: Secondary | ICD-10-CM | POA: Diagnosis present

## 2022-10-27 DIAGNOSIS — R946 Abnormal results of thyroid function studies: Secondary | ICD-10-CM | POA: Diagnosis present

## 2022-10-27 DIAGNOSIS — I4892 Unspecified atrial flutter: Secondary | ICD-10-CM

## 2022-10-27 DIAGNOSIS — R0602 Shortness of breath: Secondary | ICD-10-CM | POA: Diagnosis not present

## 2022-10-27 DIAGNOSIS — I517 Cardiomegaly: Secondary | ICD-10-CM | POA: Diagnosis not present

## 2022-10-27 DIAGNOSIS — N183 Chronic kidney disease, stage 3 unspecified: Secondary | ICD-10-CM

## 2022-10-27 DIAGNOSIS — I499 Cardiac arrhythmia, unspecified: Secondary | ICD-10-CM

## 2022-10-27 LAB — BASIC METABOLIC PANEL
Anion gap: 19 — ABNORMAL HIGH (ref 5–15)
Anion gap: 16 — ABNORMAL HIGH (ref 5–15)
BUN: 40 mg/dL — ABNORMAL HIGH (ref 6–20)
BUN: 40 mg/dL — ABNORMAL HIGH (ref 6–20)
CO2: 15 mmol/L — ABNORMAL LOW (ref 22–32)
CO2: 20 mmol/L — ABNORMAL LOW (ref 22–32)
Calcium: 9.3 mg/dL (ref 8.9–10.3)
Calcium: 8.9 mg/dL (ref 8.9–10.3)
Chloride: 102 mmol/L (ref 98–111)
Chloride: 97 mmol/L — ABNORMAL LOW (ref 98–111)
Creatinine, Ser: 2.23 mg/dL — ABNORMAL HIGH (ref 0.61–1.24)
Creatinine, Ser: 2.36 mg/dL — ABNORMAL HIGH (ref 0.61–1.24)
GFR, Estimated: 33 mL/min — ABNORMAL LOW (ref >60)
GFR, Estimated: 31 mL/min — ABNORMAL LOW (ref >60)
Glucose, Bld: 114 mg/dL — ABNORMAL HIGH (ref 70–99)
Glucose, Bld: 165 mg/dL — ABNORMAL HIGH (ref 70–99)
Potassium: 5.7 mmol/L — ABNORMAL HIGH (ref 3.5–5.1)
Potassium: 5.6 mmol/L — ABNORMAL HIGH (ref 3.5–5.1)
Sodium: 136 mmol/L (ref 135–145)
Sodium: 133 mmol/L — ABNORMAL LOW (ref 135–145)

## 2022-10-27 LAB — CBC WITH DIFFERENTIAL/PLATELET
Abs Immature Granulocytes: 0.38 10*3/uL — ABNORMAL HIGH (ref 0.00–0.07)
Basophils Absolute: 0.1 10*3/uL (ref 0.0–0.1)
Basophils Relative: 1 %
Eosinophils Absolute: 0.1 10*3/uL (ref 0.0–0.5)
Eosinophils Relative: 0 %
HCT: 58 % — ABNORMAL HIGH (ref 39.0–52.0)
Hemoglobin: 19.4 g/dL — ABNORMAL HIGH (ref 13.0–17.0)
Immature Granulocytes: 3 %
Lymphocytes Relative: 11 %
Lymphs Abs: 1.5 10*3/uL (ref 0.7–4.0)
MCH: 29.3 pg (ref 26.0–34.0)
MCHC: 33.4 g/dL (ref 30.0–36.0)
MCV: 87.5 fL (ref 80.0–100.0)
Monocytes Absolute: 1.3 10*3/uL — ABNORMAL HIGH (ref 0.1–1.0)
Monocytes Relative: 9 %
Neutro Abs: 10.8 10*3/uL — ABNORMAL HIGH (ref 1.7–7.7)
Neutrophils Relative %: 76 %
Platelets: 214 10*3/uL (ref 150–400)
RBC: 6.63 MIL/uL — ABNORMAL HIGH (ref 4.22–5.81)
RDW: 17.9 % — ABNORMAL HIGH (ref 11.5–15.5)
WBC: 14.2 10*3/uL — ABNORMAL HIGH (ref 4.0–10.5)
nRBC: 0.2 % (ref 0.0–0.2)

## 2022-10-27 LAB — COMPREHENSIVE METABOLIC PANEL
ALT: 114 U/L — ABNORMAL HIGH (ref 0–44)
AST: 152 U/L — ABNORMAL HIGH (ref 15–41)
Albumin: 3.8 g/dL (ref 3.5–5.0)
Alkaline Phosphatase: 91 U/L (ref 38–126)
Anion gap: 13 (ref 5–15)
BUN: 42 mg/dL — ABNORMAL HIGH (ref 6–20)
CO2: 19 mmol/L — ABNORMAL LOW (ref 22–32)
Calcium: 9.2 mg/dL (ref 8.9–10.3)
Chloride: 100 mmol/L (ref 98–111)
Creatinine, Ser: 2.35 mg/dL — ABNORMAL HIGH (ref 0.61–1.24)
GFR, Estimated: 31 mL/min — ABNORMAL LOW (ref >60)
Glucose, Bld: 164 mg/dL — ABNORMAL HIGH (ref 70–99)
Potassium: 6 mmol/L — ABNORMAL HIGH (ref 3.5–5.1)
Sodium: 132 mmol/L — ABNORMAL LOW (ref 135–145)
Total Bilirubin: 4.8 mg/dL — ABNORMAL HIGH (ref 0.3–1.2)
Total Protein: 6.7 g/dL (ref 6.5–8.1)

## 2022-10-27 LAB — COOXEMETRY PANEL
Carboxyhemoglobin: 2.1 % — ABNORMAL HIGH (ref 0.5–1.5)
Carboxyhemoglobin: 2.1 % — ABNORMAL HIGH (ref 0.5–1.5)
Methemoglobin: <0.7 % (ref 0.0–1.5)
Methemoglobin: <0.7 % (ref 0.0–1.5)
O2 Saturation: 69.4 %
O2 Saturation: 63.6 %
Total hemoglobin: 18.2 g/dL — ABNORMAL HIGH (ref 12.0–16.0)
Total hemoglobin: 17.9 g/dL — ABNORMAL HIGH (ref 12.0–16.0)

## 2022-10-27 LAB — MAGNESIUM: Magnesium: 2.5 mg/dL — ABNORMAL HIGH (ref 1.7–2.4)

## 2022-10-27 LAB — CG4 I-STAT (LACTIC ACID)
Lactic Acid, Venous: 2.8 mmol/L (ref 0.5–1.9)
Lactic Acid, Venous: 3.1 mmol/L (ref 0.5–1.9)
Lactic Acid, Venous: 2.8 mmol/L (ref 0.5–1.9)

## 2022-10-27 LAB — RESP PANEL BY RT-PCR (RSV, FLU A&B, COVID)  RVPGX2
Influenza A by PCR: NEGATIVE
Influenza B by PCR: NEGATIVE
Resp Syncytial Virus by PCR: NEGATIVE
SARS Coronavirus 2 by RT PCR: NEGATIVE

## 2022-10-27 LAB — PROCALCITONIN: Procalcitonin: 0.17 ng/mL

## 2022-10-27 LAB — TROPONIN I (HIGH SENSITIVITY)
Troponin I (High Sensitivity): 35 ng/L — ABNORMAL HIGH (ref <18)
Troponin I (High Sensitivity): 34 ng/L — ABNORMAL HIGH (ref <18)

## 2022-10-27 LAB — CBG MONITORING, ED: Glucose-Capillary: 142 mg/dL — ABNORMAL HIGH (ref 70–99)

## 2022-10-27 LAB — TSH: TSH: 8.889 u[IU]/mL — ABNORMAL HIGH (ref 0.350–4.500)

## 2022-10-27 LAB — BRAIN NATRIURETIC PEPTIDE: B Natriuretic Peptide: 1723.3 pg/mL — ABNORMAL HIGH (ref 0.0–100.0)

## 2022-10-27 LAB — LIPASE, BLOOD: Lipase: 14 U/L (ref 11–51)

## 2022-10-27 MED ORDER — FUROSEMIDE 10 MG/ML IJ SOLN
120.0000 mg | Freq: Two times a day (BID) | INTRAVENOUS | Status: DC
  Administered 2022-10-27 – 2022-10-30 (×6): 120 mg via INTRAVENOUS
  Filled 2022-10-27 (×5): qty 10
  Filled 2022-10-27: qty 12
  Filled 2022-10-27: qty 10

## 2022-10-27 MED ORDER — METOCLOPRAMIDE HCL 5 MG/ML IJ SOLN
5.0000 mg | Freq: Once | INTRAMUSCULAR | Status: AC
  Administered 2022-10-27: 5 mg via INTRAVENOUS
  Filled 2022-10-27: qty 2

## 2022-10-27 MED ORDER — FUROSEMIDE 10 MG/ML IJ SOLN
80.0000 mg | Freq: Once | INTRAMUSCULAR | Status: AC
  Administered 2022-10-27: 80 mg via INTRAVENOUS
  Filled 2022-10-27: qty 8

## 2022-10-27 MED ORDER — SODIUM ZIRCONIUM CYCLOSILICATE 10 G PO PACK
10.0000 g | PACK | Freq: Once | ORAL | Status: AC
  Administered 2022-10-27: 10 g via ORAL
  Filled 2022-10-27: qty 1

## 2022-10-27 MED ORDER — SODIUM CHLORIDE 0.9 % IV SOLN
2.0000 g | INTRAVENOUS | Status: AC
  Administered 2022-10-27 – 2022-11-02 (×7): 2 g via INTRAVENOUS
  Filled 2022-10-27 (×7): qty 20

## 2022-10-27 MED ORDER — WITCH HAZEL-GLYCERIN EX PADS: MEDICATED_PAD | CUTANEOUS | Status: DC | PRN

## 2022-10-27 MED ORDER — SODIUM ZIRCONIUM CYCLOSILICATE 10 G PO PACK: 10.0000 g | PACK | Freq: Once | ORAL | Status: DC

## 2022-10-27 MED ORDER — INSULIN ASPART 100 UNIT/ML IV SOLN
5.0000 [IU] | Freq: Once | INTRAVENOUS | Status: AC
  Administered 2022-10-27: 5 [IU] via INTRAVENOUS

## 2022-10-27 MED ORDER — NOREPINEPHRINE 4 MG/250ML-% IV SOLN
0.0000 ug/min | INTRAVENOUS | Status: DC
  Filled 2022-10-27: qty 250

## 2022-10-27 MED ORDER — DEXTROSE 50 % IV SOLN
1.0000 | Freq: Once | INTRAVENOUS | Status: AC
  Administered 2022-10-27: 50 mL via INTRAVENOUS
  Filled 2022-10-27: qty 50

## 2022-10-27 MED ORDER — NOREPINEPHRINE 4 MG/250ML-% IV SOLN: 2.0000 ug/min | INTRAVENOUS | Status: DC

## 2022-10-27 MED ORDER — SODIUM CHLORIDE 0.9 % IV SOLN
250.0000 mL | INTRAVENOUS | Status: DC
  Administered 2022-10-31 – 2022-11-04 (×2): 250 mL via INTRAVENOUS

## 2022-10-27 MED ORDER — MILRINONE LACTATE IN DEXTROSE 20-5 MG/100ML-% IV SOLN
0.2500 ug/kg/min | INTRAVENOUS | Status: DC
  Administered 2022-10-27 – 2022-10-29 (×6): 0.25 ug/kg/min via INTRAVENOUS
  Filled 2022-10-27 (×5): qty 100

## 2022-10-27 MED ORDER — HEPARIN (PORCINE) 25000 UT/250ML-% IV SOLN
1550.0000 [IU]/h | INTRAVENOUS | Status: DC
  Administered 2022-10-27: 1600 [IU]/h via INTRAVENOUS
  Administered 2022-10-28: 1800 [IU]/h via INTRAVENOUS
  Administered 2022-10-28: 1900 [IU]/h via INTRAVENOUS
  Administered 2022-10-29 (×2): 1850 [IU]/h via INTRAVENOUS
  Administered 2022-10-30 – 2022-11-04 (×8): 1800 [IU]/h via INTRAVENOUS
  Filled 2022-10-27 (×14): qty 250

## 2022-10-27 MED ORDER — MILRINONE LACTATE IN DEXTROSE 20-5 MG/100ML-% IV SOLN
INTRAVENOUS | Status: AC
  Filled 2022-10-27: qty 100

## 2022-10-27 MED ORDER — ALBUTEROL SULFATE (2.5 MG/3ML) 0.083% IN NEBU
10.0000 mg | INHALATION_SOLUTION | Freq: Once | RESPIRATORY_TRACT | Status: AC
  Administered 2022-10-27: 10 mg via RESPIRATORY_TRACT
  Filled 2022-10-27: qty 12

## 2022-10-27 MED ORDER — AMIODARONE HCL IN DEXTROSE 360-4.14 MG/200ML-% IV SOLN
60.0000 mg/h | INTRAVENOUS | Status: AC
  Administered 2022-10-27: 60 mg/h via INTRAVENOUS
  Filled 2022-10-27: qty 200

## 2022-10-27 MED ORDER — CALCIUM GLUCONATE 10 % IV SOLN
1.0000 g | Freq: Once | INTRAVENOUS | Status: AC
  Administered 2022-10-27: 1 g via INTRAVENOUS
  Filled 2022-10-27: qty 10

## 2022-10-27 MED ORDER — ONDANSETRON HCL 4 MG/2ML IJ SOLN
4.0000 mg | Freq: Four times a day (QID) | INTRAMUSCULAR | Status: DC | PRN
  Administered 2022-10-29 – 2022-11-03 (×3): 4 mg via INTRAVENOUS
  Filled 2022-10-27 (×3): qty 2

## 2022-10-27 MED ORDER — ADENOSINE 6 MG/2ML IV SOLN
6.0000 mg | Freq: Once | INTRAVENOUS | Status: DC
  Filled 2022-10-27: qty 2

## 2022-10-27 MED ORDER — AMIODARONE HCL IN DEXTROSE 360-4.14 MG/200ML-% IV SOLN
60.0000 mg/h | INTRAVENOUS | Status: DC
  Administered 2022-10-27 – 2022-10-28 (×2): 30 mg/h via INTRAVENOUS
  Administered 2022-10-28: 59.9999 mg/h via INTRAVENOUS
  Administered 2022-10-29 – 2022-11-06 (×31): 60 mg/h via INTRAVENOUS
  Filled 2022-10-27 (×36): qty 200

## 2022-10-27 NOTE — Consult Note (Addendum)
Advanced Heart Failure Team Consult Note   Primary Physician: Kristian Covey, MD PCP-Cardiologist:  Armanda Magic, MD  Reason for Consultation: acute on chronic systolic heart failure w/ low output and AFL w/ RVR   HPI:    Harold Park is seen today for evaluation of acute on chronic systolic heart failure w/ low output, in the setting of recurrent AFL w/ RVR at the request of Dr, Charolotte Eke, .   57 y.o. 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   Echo 2011: EF 30-35%. Cath 2011 with normal cors.   Echo 10/17: EF 35-40%.   We saw him for the first time in May 2017.   Underwent DC-CV on 06/19/17 for recurrent AF Seen in AF Clinic on 07/02/17. Felt not to be ideal candidate for AF ablation   Echo 12/21 EF 40-45%   CT angio in 2019 aortic root was 4.6 cm   CT chest 03/06/20 to evaluate hilar fullness seen on CXR. Interval development of numerous enlarged mediastinal and bilateral hilar lymph nodes. Underwent bronchoscopy with biopsy of mediastinal nodes. 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 Treated with prednisone.    CT coronary 03/06/20. Calcium score 0. Poor arterial imaging but no obvious CAD   Back in AFL at f/u 09/29/22. Diuretics increased. Underwent DCCV to SR on 07/25. Seen by Afib clinic on 08/02 and in AFL with RVR. Ranexa stopped in anticipation of Tikosyn loading.  Readmitted from 8/6-8/17 w/ AF w/ RVR. He was sedated and underwent cardioversion in the ED .  Following cardioversion he developed cardiogenic shock and was admitted to the CCU. Bedside POCUS with EF 15%. Echo with EF 25-30%, RV moderately down. Initial CO-OX low but repeat was okay. Did not require inotrope support. Diuresed and GDMT scaled back. He was seen by EP and started on IV amiodarone with plans for Afib ablation in the future. Had recurrent Afib with RVR and PVCs. DCCV attempted X 2 on 08/11, maintained sinus rhythm for seconds before converting  back to AF. Amiodarone load resumed and had repeat cardioversion on 10/23/22 with conversion to SR. Transitioned back to PO amio and discharged home on amio taper.   He was discharged on 8/16 and unfortunately returned back to [facility] w/ recurrent Afib w/ RVR and a/c CHF. Symptomatic w/ exertional dyspnea, orthopnea, abdominal distention and nausea. He was hypoxic and required [place] in the ED. BNP 1723.3. CXR w/ cardiomegaly and focal airspace opacity in rt mid lung field suspicious for infection.  WBC 14K. Respiratory panel COVID and Flu negative. Hs trop 35>>34. Also found to be hyperkalemic w/ K of 6.0, treated w/ albuterol, calcium gluconate, and insulin. CMP concerning for shock, CO2 19, SCr 2.35 (up from 1.92 recent discharge), AST 152, ALT 114.  Was given dose of IV Lasix at [facility] and transferred to [facility] for further management.   On arrival to [facility], he remains in AFL, 120s. BP currently normotensive. No UOP since lasix was given. Bladder scan + 430 cc of urine.  On 3L [place]. Normal WOB.    2D Echo 10/15/22  1. Left ventricular ejection fraction, by estimation, is 25 to 30%. The  left ventricle has severely decreased function. The left ventricle  demonstrates global hypokinesis. The left ventricular internal cavity size  was severely dilated. Left ventricular  diastolic function could not be evaluated.   2. Right ventricular systolic function is moderately reduced. The right  ventricular size is moderately  enlarged. Tricuspid regurgitation signal is  inadequate for assessing PA pressure.   3. The mitral valve is normal in structure. Trivial mitral valve  regurgitation. No evidence of mitral stenosis.   4. The aortic valve is normal in structure. Aortic valve regurgitation is  not visualized. No aortic stenosis is present.   5. The inferior vena cava is normal in size with <50% respiratory  variability, suggesting right atrial pressure of 8 mmHg.   6. Ascending aorta and aortic  root measurements are within normal limits  for age when indexed to body surface area.   7. Right atrial size was mildly dilated.   8. Left atrial size was moderately dilated.      Review of Systems: [y] = yes, [ ]  = no   General: Weight gain [ ] ; Weight loss [ ] ; Anorexia [ ] ; Fatigue [Y ]; Fever [ ] ; Chills [ ] ; Weakness [ Y]  Cardiac: Chest pain/pressure [ ] ; Resting SOB [ Y]; Exertional SOB [ Y]; Orthopnea [Y ]; Pedal Edema [ ] ; Palpitations [ ] ; Syncope [ ] ; Presyncope [ ] ; Paroxysmal nocturnal dyspnea[ ]   Pulmonary: Cough [ ] ; Wheezing[ ] ; Hemoptysis[ ] ; Sputum [ ] ; Snoring [Y ]  GI: Vomiting[ ] ; Dysphagia[ ] ; Melena[ ] ; Hematochezia [ ] ; Heartburn[ ] ; Abdominal pain [ ] ; Constipation [ ] ; Diarrhea [ ] ; BRBPR [ ]   GU: Hematuria[ ] ; Dysuria [ ] ; Nocturia[ ]   Vascular: Pain in legs with walking [ ] ; Pain in feet with lying flat [ ] ; Non-healing sores [ ] ; Stroke [ ] ; TIA [ ] ; Slurred speech [ ] ;  Neuro: Headaches[ ] ; Vertigo[ ] ; Seizures[ ] ; Paresthesias[ ] ;Blurred vision [ ] ; Diplopia [ ] ; Vision changes [ ]   Ortho/Skin: Arthritis [ ] ; Joint pain [ ] ; Muscle pain [ ] ; Joint swelling [ ] ; Back Pain [ ] ; Rash [ ]   Psych: Depression[ ] ; Anxiety[ ]   Heme: Bleeding problems [ ] ; Clotting disorders [ ] ; Anemia [ ]   Endocrine: Diabetes [ ] ; Thyroid dysfunction[ ]   Home Medications Prior to Admission medications   Medication Sig Start Date End Date Taking? Authorizing Provider  albuterol (VENTOLIN HFA) 108 (90 Base) MCG/ACT inhaler TAKE 2 PUFFS BY MOUTH EVERY 6 HOURS AS NEEDED FOR WHEEZE OR SHORTNESS OF BREATH 02/24/22   Leslye Peer, MD  amiodarone (PACERONE) 200 MG tablet Take 400 mg twice a day x 7 days then 200 mg twice a day 10/24/22   Clegg, Amy D, NP  DULoxetine (CYMBALTA) 20 MG capsule TAKE 1 CAPSULE BY MOUTH EVERY DAY 11/12/21   Cher Franzoni, Bevelyn Buckles, MD  ELIQUIS 5 MG TABS tablet TAKE 1 TABLET BY MOUTH TWICE A DAY 10/10/22   Lady Wisham, Bevelyn Buckles, MD  Magnesium 250 MG TABS Take 1 tablet  by mouth 2 (two) times daily.    [provider]  methocarbamol (ROBAXIN) 500 MG tablet Take 500 mg by mouth 2 (two) times daily as needed for muscle spasms.    [provider]  metoCLOPramide (REGLAN) 5 MG tablet Take 1 tablet (5 mg total) by mouth 3 (three) times daily. 10/24/22 10/24/23  Clegg, Amy D, NP  NEEDLE, DISP, 20 G (BD DISP NEEDLES) 20G X 1-1/2" MISC Use as directed 11/29/21   [name], Elberta Fortis, MD  NEEDLE, DISP, 22 G 22G X 1" MISC Use to inject testosterone into the muscle once every 14 days. 10/19/20   [name], Elberta Fortis, MD  ondansetron (ZOFRAN) 4 MG tablet Take 1 tablet (4 mg total) by mouth every 8 (  eight) hours as needed for nausea or vomiting. 10/24/22 10/24/23  Clegg, Amy D, NP  potassium chloride SA (KLOR-CON M20) 20 MEQ tablet TAKE 4 TABLETS BY MOUTH EVERY MORNING AND 2 TABLETS DAILY W/ LUNCH AND 4 TABS EVERY EVENING. Patient taking differently: TAKE 4 TABLETS BY MOUTH EVERY MORNING AND 2 TABLETS DAILY W/ LUNCH AND 4 TABS EVERY EVENING.  Patient states he will start back taking his Potassium- Taking 1 tablet by mouth daily 02/11/22   Torell Minder, Bevelyn Buckles, MD  scopolamine (TRANSDERM-SCOP) 1 MG/3DAYS Place 1 patch (1.5 mg total) onto the skin every 3 (three) days. 10/25/22   Clegg, Amy D, NP  sildenafil (VIAGRA) 100 MG tablet TAKE 1 TABLET BY MOUTH EVERY DAY AS NEEDED FOR ERECTILE DYSFUNCTION 10/22/21   Breshae Belcher, Bevelyn Buckles, MD  testosterone cypionate (DEPOTESTOSTERONE CYPIONATE) 200 MG/ML injection Inject 1.5 mLs (300 mg total) into the muscle every 14 (fourteen) days. 11/29/21   [name], Elberta Fortis, MD  torsemide (DEMADEX) 20 MG tablet Take 0.5 tablets (10 mg total) by mouth 2 (two) times daily. 10/01/22   Tonye Becket D, NP    Past Medical History: Past Medical History:  Diagnosis Date   Chronic bronchitis    HTN (hypertension)    NICM (nonischemic cardiomyopathy) (HCC)    Obesity (BMI 30-39.9) 12/04/2015   OSA (obstructive sleep apnea) 08/19/2015   Severe with AHI  64/hr now on CPAP at 13cm H2O   PAF (paroxysmal atrial fibrillation) (HCC)    Systolic CHF, chronic (HCC)     Past Surgical History: Past Surgical History:  Procedure Laterality Date   APPENDECTOMY     BRONCHIAL WASHINGS  04/24/2020   Procedure: BRONCHIAL WASHINGS;  Surgeon: Leslye Peer, MD;  Location: [facility] ENDOSCOPY;  Service: Pulmonary;;   CARDIAC CATHETERIZATION     CARDIOVERSION N/A 06/19/2017   Procedure: CARDIOVERSION;  Surgeon: Dolores Patty, MD;  Location: [facility];  Service: Cardiovascular;  Laterality: N/A;   CARDIOVERSION N/A 10/02/2022   Procedure: CARDIOVERSION;  Surgeon: Dolores Patty, MD;  Location: [facility] INVASIVE CV LAB;  Service: Cardiovascular;  Laterality: N/A;   CARDIOVERSION N/A 10/20/2022   Procedure: CARDIOVERSION;  Surgeon: Dolores Patty, MD;  Location: [facility] INVASIVE CV LAB;  Service: Cardiovascular;  Laterality: N/A;   CARDIOVERSION N/A 10/23/2022   Procedure: CARDIOVERSION;  Surgeon: Dolores Patty, MD;  Location: [facility] INVASIVE CV LAB;  Service: Cardiovascular;  Laterality: N/A;   FINE NEEDLE ASPIRATION  04/24/2020   Procedure: FINE NEEDLE ASPIRATION (FNA) LINEAR;  Surgeon: Leslye Peer, MD;  Location: [facility] ENDOS[facility];  Service: Pulmonary;;   LIPOMA RESECTION     arms   SPLENECTOMY     SPLIT NIGHT STUDY  08/12/2015   VIDEO BRONCHOSCOPY WITH ENDOBRONCHIAL ULTRASOUND N/A 04/24/2020   Procedure: VIDEO BRONCHOSCOPY WITH ENDOBRONCHIAL ULTRASOUND;  Surgeon: Leslye Peer, MD;  Location: [facility] ENDOSCOPY;  Service: Pulmonary;  Laterality: N/A;    Family History: Family History  Problem Relation Age of Onset   Breast cancer Mother    Cancer Father        blood cancer   Hypertension Other    Heart disease Other    Stomach cancer Neg Hx    Colon cancer Neg Hx    Esophageal cancer Neg Hx    Pancreatic cancer Neg Hx    Rectal cancer Neg Hx     Social History: Social History   Socioeconomic History   Marital status: Married    Spouse name: Not  on file  Number of children: 3   Years of education: Not on file   Highest education level: Not on file  Occupational History   Occupation: realtor  Tobacco Use   Smoking status: Never   Smokeless tobacco: Never  Vaping Use   Vaping status: Never Used  Substance and Sexual Activity   Alcohol use: Yes    Comment: occasional   Drug use: Never   Sexual activity: Not on file  Other Topics Concern   Not on file  Social History Narrative   Not on file   Social Determinants of Health   Financial Resource Strain: Not on file  Food Insecurity: No Food Insecurity (10/15/2022)   Hunger Vital Sign    Worried About Running Out of Food in the Last Year: Never true    Ran Out of Food in the Last Year: Never true  Transportation Needs: No Transportation Needs (10/15/2022)   PRAPARE - Administrator, Civil Service (Medical): No    Lack of Transportation (Non-Medical): No  Physical Activity: Not on file  Stress: Not on file  Social Connections: Not on file    Allergies:  Allergies  Allergen Reactions   Bee Venom Anaphylaxis   Amiodarone Nausea Only   Spironolactone     Objective:    Vital Signs:   Temp:  [97.4 F (36.3 C)] 97.4 F (36.3 C) (08/19 1316) Pulse Rate:  [58-132] 118 (08/19 1316) Resp:  [28-42] 29 (08/19 1316) BP: (103-121)/(48-97) 111/48 (08/19 1316) SpO2:  [87 %-100 %] 100 % (08/19 1316)    Weight change: There were no vitals filed for this visit.  Intake/Output:  No intake or output data in the 24 hours ending 10/27/22 1344    Physical Exam    General:  fatigued appearing. No resp difficulty HEENT: normal Neck: supple. Thick neck, JVD not well visualized. Carotids 2+ bilat; no bruits. No lymphadenopathy or thyromegaly appreciated. Cor: PMI nondisplaced. Irregularly irregular rhythm and rate. No rubs, gallops or murmurs. Lungs: decreased BS at the bases  Abdomen: soft, nontender, +distended. No hepatosplenomegaly. No bruits or masses. Good  bowel sounds. Extremities: no cyanosis, clubbing, rash, edema, cool distal ext  Neuro: alert & orientedx3, cranial nerves grossly intact. moves all 4 extremities w/o difficulty. Affect pleasant   Telemetry   AFL w/ RVR 130s   EKG    AFL 113 bpm, atypical RBBB (personally reviewed)   Labs   Basic Metabolic Panel: Recent Labs  Lab 10/21/22 0138 10/22/22 0549 10/23/22 0153 10/24/22 0609 10/27/22 1042  NA 129* 133* 129* 131* 132*  K 3.7 3.2* 3.8 3.7 6.0*  CL 92* 96* 91* 94* 100  CO2 22 25 20* 26 19*  GLUCOSE 323* 127* 306* 112* 164*  BUN 43* 46* 43* 42* 42*  CREATININE 2.24* 2.07* 2.11* 1.92* 2.35*  CALCIUM 7.9* 8.4* 7.8* 8.5* 9.2  MG 2.1 2.1 2.0 2.3 2.5*    Liver Function Tests: Recent Labs  Lab 10/27/22 1042  AST 152*  ALT 114*  ALKPHOS 91  BILITOT 4.8*  PROT 6.7  ALBUMIN 3.8   Recent Labs  Lab 10/27/22 1042  LIPASE 14   No results for input(s): "AMMONIA" in the last 168 hours.  CBC: Recent Labs  Lab 10/23/22 0153 10/24/22 0609 10/27/22 1042  WBC 14.3* 12.4* 14.2*  NEUTROABS  --   --  10.8*  HGB 17.2* 17.7* 19.4*  HCT 51.4 52.8* 58.0*  MCV 85.1 83.9 87.5  PLT 163 181 214    Cardiac Enzymes:  No results for input(s): "CKTOTAL", "CKMB", "CKMBINDEX", "TROPONINI" in the last 168 hours.  BNP: BNP (last 3 results) Recent Labs    10/01/22 1020 10/15/22 0126 10/27/22 1049  BNP 822.7* 777.2* 1,723.3*    ProBNP (last 3 results) No results for input(s): "PROBNP" in the last 8760 hours.   CBG: Recent Labs  Lab 10/23/22 1530 10/23/22 2104 10/24/22 0611 10/24/22 1150 10/27/22 1128  GLUCAP 143* 125* 126* 112* 142*    Coagulation Studies: No results for input(s): "LABPROT", "INR" in the last 72 hours.   Imaging   DG Chest Portable 1 View  Result Date: 10/27/2022 CLINICAL DATA:  SOB, arrythnmia, feels fluid overloaded EXAM: PORTABLE CHEST 1 VIEW COMPARISON:  CXR 10/14/22 FINDINGS: Cardiomegaly. No pleural effusion. No pneumothorax. There  is a focal airspace opacity in the right mid lung field that is suspicious for infection. No radiographically apparent displaced rib fracture. Visualized upper abdomen is unremarkable. IMPRESSION: 1. Focal airspace opacity in the right mid lung field is suspicious for infection. 2.  Cardiomegaly. Electronically Signed   By: Lorenza Cambridge M.D.   On: 10/27/2022 13:31     Medications:     Current Medications:  adenosine (ADENOCARD) IV  6 mg Intravenous Once    Infusions:  amiodarone     amiodarone     norepinephrine (LEVOPHED) Adult infusion        Patient Profile   57 y/o male w/ chronic systolic heart failure due to NICM, difficult to control AFL failed multiple recent cardioversion's, recent admission w/ cardiogenic shock, pulmonary sarcoid (PET - for cardiac involvement), PVCs, OSA, Obesity and CKD IIIb, readmitted w/ acute on chronic CHF w/ low output in the setting recurrent ALF w/ RVR.   Assessment/Plan   1. Acute on Chronic Systolic Heart Failure w/ Low-Output  - Echo 2011: EF 30-35%. Cath 2011 with normal cors. - Echo 10/17: EF 35-40%. - CMRI 2017 LVEF 20%. No LGE - Echo 12/21 EF 40-45%. Coronary CTA normal cors, calcium score 0   - Cardiac PET 2023- no evidence for cardiac sarcoid or inflammatory. LVEF 38%.   - recent admit 8/24, developed CGS post DCCV. Echo EF 20-25% and RV moderately reduced - now readmitted w/ ADHF in setting of recurrent AFL w/ RVR. NYHA Class IIIB. Suspect low output   - start amio gtt for rate/rhythm control  - place central access for co-ox/ CVP monitoring. Check POC Lactic acid  - suspect will need milrinone - diurese w/ IV Lasix  - GDMT limited by AKI on CKD  - check Iron studies, r/o hemochromatosis   2. Recurrent AFL w/ RVR  - failed multiple DCCVs as outlined above. Failing PO amiodarone  - start amio gtt - heparin gtt while in ICU - TSH elevated (8.9). Check Free T3/T4 - CPAP at bedtime  - maintenance of NSR imperative. He does not  tolerate AFL well. Multiple readmits for ADHF and shock. - will ask EP to see for possible AFL ablation vs AVN ablation w/ PPM   3. AKI on CKD IIIB - likely cardiorenal  - Recent Scr 1.8 but previously creatinine baseline ~ 1.2.  - Recent admit, SCr bumped to 2.2>>down to 1.9 at d/c  - SCr 2.35 on this admit, suspect low-output - start milrinone for inotropic support  - monitor co-ox and CVPs - avoid hypotension and follow BMP   4. Hyperkalemia - initial K in ER 6.0, treated w/ albuterol, calcium gluconate, and insulin - repeat STAT BMP. If still elevated, give  lokelma   - monitor on tele - zoll pads in place    5. OSA  - Continue CPAP nightly.  6. ? PNA -  CXR w/ focal airspace opacity in the right mid lung field, new since prior exam. WBC 14K - check PCT    Length of Stay: 0  Brittainy Simmons, PA-C  10/27/2022, 1:44 PM  Advanced Heart Failure Team Pager [phone] (M-F; 7a - 5p)  Please contact [facility] for night-coverage after hours (4p -7a ) and weekends on [email or web address]  Agree with above.   57 y/o male with systolic HF due to NICM. EF 35-40% at baseline., CKD 3b  Recently admitted with recurrent AF and shock. EF down to 20%. Underwent amio load followed by multiple DC-CVs.   Last DC-CV was on 8/15. D/c'd home on 8/16 in NSR after > 10g load of amio.   Over weekend began to feel poorly again.   Presented to [facility] earlier to day with recurrent AF with RVR, shock (lactate 2.5) and hyperkalemia.   Treated with bicarb, insulin, calcium and lokelma.   On arrival. Pale and diaphoretic. SBP ~100  General:  Ill-appearing. SOB HEENT: normal Neck: supple. JVP to jaw Carotids 2+ bilat; no bruits. No lymphadenopathy or thryomegaly appreciated. BJY:NWGNF irregular Lungs: clear Abdomen: obese soft, nontender, nondistended. No hepatosplenomegaly. No bruits or masses. Good bowel sounds. Extremities: no cyanosis, clubbing, rash, edema cool diaphoretic Neuro:  alert & orientedx3, cranial nerves grossly intact. moves all 4 extremities w/o difficulty. Affect pleasant  He has cardiogenic shock in setting of recurrent AF with RVR. Will obtain central access. Start milrinone. Follow CVP and co-ox. If co-ox not improving can escalate to IABP as needed.   Start IV amio. Will eventually need repeat DC-CV but low likelihood to hold for long. Will consult EP to discuss options.   Recheck K. Repeat lokelma as needed   CRITICAL CARE Performed by: Arvilla Meres  Total critical care time: 55 minutes  Critical care time was exclusive of separately billable procedures and treating other patients.  Critical care was necessary to treat or prevent imminent or life-threatening deterioration.  Critical care was time spent personally by me (independent of midlevel providers or residents) on the following activities: development of treatment plan with patient and/or surrogate as well as nursing, discussions with consultants, evaluation of patient's response to treatment, examination of patient, obtaining history from patient or surrogate, ordering and performing treatments and interventions, ordering and review of laboratory studies, ordering and review of radiographic studies, pulse oximetry and re-evaluation of patient's condition.  Arvilla Meres, MD  5:23 PM

## 2022-10-27 NOTE — Progress Notes (Signed)
   10/27/22 2238  BiPAP/CPAP/SIPAP  $ Non-Invasive Home Ventilator  Initial  BiPAP/CPAP/SIPAP Pt Type Adult  BiPAP/CPAP/SIPAP Resmed  Mask Type Nasal mask  Mask Size Large  EPAP  (5,18)  Flow Rate 4 lpm  Patient Home Equipment No  Auto Titrate Yes  BiPAP/CPAP /SiPAP Vitals  Pulse Rate (!) 112  Resp 20  SpO2 94 %  Bilateral Breath Sounds Clear

## 2022-10-27 NOTE — Progress Notes (Signed)
ANTICOAGULATION CONSULT NOTE - Initial Consult  Pharmacy Consult for IV Heparin Indication: atrial fibrillation  Allergies  Allergen Reactions   Bee Venom Anaphylaxis   Amiodarone Nausea Only   Spironolactone     Patient Measurements:   Heparin Dosing Weight: ~ 120 kg  Vital Signs: Temp: 97.4 F (36.3 C) (08/19 1316) Temp Source: Oral (08/19 1316) BP: 111/48 (08/19 1316) Pulse Rate: 118 (08/19 1316)  Labs: Recent Labs    10/27/22 1042 10/27/22 1048 10/27/22 1242  HGB 19.4*  --   --   HCT 58.0*  --   --   PLT 214  --   --   CREATININE 2.35*  --   --   TROPONINIHS  --  35* 34*    Estimated Creatinine Clearance: 53.6 mL/min (A) (by C-G formula based on SCr of 2.35 mg/dL (H)).   Medical History: Past Medical History:  Diagnosis Date   Chronic bronchitis    HTN (hypertension)    NICM (nonischemic cardiomyopathy) (HCC)    Obesity (BMI 30-39.9) 12/04/2015   OSA (obstructive sleep apnea) 08/19/2015   Severe with AHI 64/hr now on CPAP at 13cm H2O   PAF (paroxysmal atrial fibrillation) (HCC)    Systolic CHF, chronic (HCC)     Medications:  Infusions:   sodium chloride     amiodarone 60 mg/hr (10/27/22 1501)   amiodarone     heparin     norepinephrine (LEVOPHED) Adult infusion      Assessment: 57 yo male on chronic Eliquis for afib.  Did not take this morning's dose PTA.  No overt bleeding or complications noted.  CBC stable.  Pharmacy asked to start IV heparin today while Eliquis is on hold in case of procedures.  Goal of Therapy:  Heparin level 0.3-0.7 units/ml APTT 66-102 Monitor platelets by anticoagulation protocol: Yes   Plan:  Start IV heparin at 1600 units/hr Check aPTT 6 hrs after heparin starts, heparin levels will likely be falsely elevated due to recent Eliquis. Daily aPTT, heparin level and CBC.  Reece Leader, Colon Flattery, BCCP Clinical Pharmacist  10/27/2022 3:04 PM   [facility] pharmacy phone numbers are listed on [email or web address]

## 2022-10-27 NOTE — TOC Progression Note (Signed)
Transition of Care [facility]) - Progression Note    Patient Details  Name: Harold Park MRN: [id number] Date of Birth: [birth year]  Transition of Care [facility]) CM/SW Contact  Nicanor Bake Phone Number: [phone] 10/27/2022, 4:07 PM  Clinical Narrative:  CSW met with the pt and wife at bedside. CSW and wife spoke outside of the room while pt was receiving a procedure. CSW built rapport with wife. Pts wife stated right now she was feeling overwhelmed. Pts wife stated that he is currently self employed and due to his condition he is not able to work and provide as much as he would like which is creating some stress for him. Pts wife stated that they applied for disability 13 years ago and got denied to pts education level. CSW advised that they consider revisiting the conversation for disability to see if the pt qualifies. Pts wife does work. CSW will schedule the pt a follow up hospital visit at a later time. TOC will continue following.          Expected Discharge Plan and Services                                               Social Determinants of Health (SDOH) Interventions SDOH Screenings   Food Insecurity: No Food Insecurity (10/15/2022)  Housing: Low Risk  (10/15/2022)  Transportation Needs: No Transportation Needs (10/15/2022)  Utilities: Not At Risk (10/15/2022)  Depression (PHQ2-9): Low Risk  (11/29/2021)  Tobacco Use: Low Risk  (10/27/2022)    Readmission Risk Interventions     No data to display

## 2022-10-27 NOTE — ED Provider Notes (Signed)
[place] EMERGENCY DEPARTMENT AT [facility] Provider Note   CSN: [id number] Arrival date & time: 10/27/22  1026     History  No chief complaint on file.   Harold Park is a 57 y.o. male.  The history is provided by the patient, a relative and medical records. No language interpreter was used.  Shortness of Breath Severity:  Severe Onset quality:  Gradual Duration:  3 days Timing:  Constant Progression:  Worsening Chronicity:  Recurrent Context: not URI   Relieved by:  Nothing Worsened by:  Exertion Ineffective treatments:  None tried Associated symptoms: cough   Associated symptoms: no abdominal pain, no chest pain, no diaphoresis, no fever, no headaches, no neck pain, no rash, no sputum production, no vomiting and no wheezing        Home Medications Prior to Admission medications   Medication Sig Start Date End Date Taking? Authorizing Provider  albuterol (VENTOLIN HFA) 108 (90 Base) MCG/ACT inhaler TAKE 2 PUFFS BY MOUTH EVERY 6 HOURS AS NEEDED FOR WHEEZE OR SHORTNESS OF BREATH 02/24/22   Leslye Peer, MD  amiodarone (PACERONE) 200 MG tablet Take 400 mg twice a day x 7 days then 200 mg twice a day 10/24/22   Clegg, Amy D, NP  DULoxetine (CYMBALTA) 20 MG capsule TAKE 1 CAPSULE BY MOUTH EVERY DAY 11/12/21   Bensimhon, Bevelyn Buckles, MD  ELIQUIS 5 MG TABS tablet TAKE 1 TABLET BY MOUTH TWICE A DAY 10/10/22   Bensimhon, Bevelyn Buckles, MD  Magnesium 250 MG TABS Take 1 tablet by mouth 2 (two) times daily.    [provider]  methocarbamol (ROBAXIN) 500 MG tablet Take 500 mg by mouth 2 (two) times daily as needed for muscle spasms.    [provider]  metoCLOPramide (REGLAN) 5 MG tablet Take 1 tablet (5 mg total) by mouth 3 (three) times daily. 10/24/22 10/24/23  Clegg, Amy D, NP  NEEDLE, DISP, 20 G (BD DISP NEEDLES) 20G X 1-1/2" MISC Use as directed 11/29/21   [name], Elberta Fortis, MD  NEEDLE, DISP, 22 G 22G X 1" MISC Use to inject testosterone into the muscle  once every 14 days. 10/19/20   [name], Elberta Fortis, MD  ondansetron (ZOFRAN) 4 MG tablet Take 1 tablet (4 mg total) by mouth every 8 (eight) hours as needed for nausea or vomiting. 10/24/22 10/24/23  Clegg, Amy D, NP  potassium chloride SA (KLOR-CON M20) 20 MEQ tablet TAKE 4 TABLETS BY MOUTH EVERY MORNING AND 2 TABLETS DAILY W/ LUNCH AND 4 TABS EVERY EVENING. Patient taking differently: TAKE 4 TABLETS BY MOUTH EVERY MORNING AND 2 TABLETS DAILY W/ LUNCH AND 4 TABS EVERY EVENING.  Patient states he will start back taking his Potassium- Taking 1 tablet by mouth daily 02/11/22   Bensimhon, Bevelyn Buckles, MD  scopolamine (TRANSDERM-SCOP) 1 MG/3DAYS Place 1 patch (1.5 mg total) onto the skin every 3 (three) days. 10/25/22   Clegg, Amy D, NP  sildenafil (VIAGRA) 100 MG tablet TAKE 1 TABLET BY MOUTH EVERY DAY AS NEEDED FOR ERECTILE DYSFUNCTION 10/22/21   Bensimhon, Bevelyn Buckles, MD  testosterone cypionate (DEPOTESTOSTERONE CYPIONATE) 200 MG/ML injection Inject 1.5 mLs (300 mg total) into the muscle every 14 (fourteen) days. 11/29/21   [name], Elberta Fortis, MD  torsemide (DEMADEX) 20 MG tablet Take 0.5 tablets (10 mg total) by mouth 2 (two) times daily. 10/01/22   Clegg, Amy D, NP      Allergies    Bee venom, Amiodarone, and Spironolactone  Review of Systems   Review of Systems  Constitutional:  Positive for fatigue. Negative for chills, diaphoresis and fever.  HENT:  Negative for congestion.   Respiratory:  Positive for cough, chest tightness and shortness of breath. Negative for sputum production, wheezing and stridor.   Cardiovascular:  Negative for chest pain and palpitations.  Gastrointestinal:  Positive for nausea. Negative for abdominal distention, abdominal pain, constipation, diarrhea and vomiting.  Genitourinary:  Negative for flank pain.  Musculoskeletal:  Negative for back pain, neck pain and neck stiffness.  Skin:  Negative for rash and wound.  Neurological:  Negative for light-headedness and headaches.   Psychiatric/Behavioral:  Negative for agitation and confusion.   All other systems reviewed and are negative.   Physical Exam Updated Vital Signs There were no vitals taken for this visit. Physical Exam Vitals and nursing note reviewed.  Constitutional:      General: He is in acute distress.     Appearance: He is well-developed. He is ill-appearing. He is not toxic-appearing or diaphoretic.  HENT:     Head: Normocephalic and atraumatic.  Eyes:     Conjunctiva/sclera: Conjunctivae normal.     Pupils: Pupils are equal, round, and reactive to light.  Cardiovascular:     Rate and Rhythm: Tachycardia present. Rhythm irregular.     Heart sounds: Murmur heard.  Pulmonary:     Effort: Pulmonary effort is normal. Tachypnea present.     Breath sounds: No stridor. Rales present. No wheezing or rhonchi.  Chest:     Chest wall: No tenderness.  Abdominal:     Palpations: Abdomen is soft.     Tenderness: There is no abdominal tenderness.  Musculoskeletal:        General: No swelling.     Cervical back: Neck supple.     Right lower leg: No edema.     Left lower leg: No edema.  Skin:    General: Skin is warm and dry.     Capillary Refill: Capillary refill takes less than 2 seconds.     Findings: No erythema.  Neurological:     General: No focal deficit present.     Mental Status: He is alert.  Psychiatric:        Mood and Affect: Mood is anxious.     ED Results / Procedures / Treatments   Labs (all labs ordered are listed, but only abnormal results are displayed) Labs Reviewed  CBC WITH DIFFERENTIAL/PLATELET - Abnormal; Notable for the following components:      Result Value   WBC 14.2 (*)    RBC 6.63 (*)    Hemoglobin 19.4 (*)    HCT 58.0 (*)    RDW 17.9 (*)    Neutro Abs 10.8 (*)    Monocytes Absolute 1.3 (*)    Abs Immature Granulocytes 0.38 (*)    All other components within normal limits  COMPREHENSIVE METABOLIC PANEL - Abnormal; Notable for the following components:    Sodium 132 (*)    Potassium 6.0 (*)    CO2 19 (*)    Glucose, Bld 164 (*)    BUN 42 (*)    Creatinine, Ser 2.35 (*)    AST 152 (*)    ALT 114 (*)    Total Bilirubin 4.8 (*)    GFR, Estimated 31 (*)    All other components within normal limits  TSH - Abnormal; Notable for the following components:   TSH 8.889 (*)    All other components within  normal limits  BRAIN NATRIURETIC PEPTIDE - Abnormal; Notable for the following components:   B Natriuretic Peptide 1,723.3 (*)    All other components within normal limits  MAGNESIUM - Abnormal; Notable for the following components:   Magnesium 2.5 (*)    All other components within normal limits  CBG MONITORING, ED - Abnormal; Notable for the following components:   Glucose-Capillary 142 (*)    All other components within normal limits  TROPONIN I (HIGH SENSITIVITY) - Abnormal; Notable for the following components:   Troponin I (High Sensitivity) 35 (*)    All other components within normal limits  RESP PANEL BY RT-PCR (RSV, FLU A&B, COVID)  RVPGX2  LIPASE, BLOOD  TROPONIN I (HIGH SENSITIVITY)    EKG EKG Interpretation Date/Time:  Monday October 27 2022 10:35:24 EDT Ventricular Rate:  136 PR Interval:  186 QRS Duration:  150 QT Interval:  394 QTC Calculation: 592 R Axis:   251  Text Interpretation: Sinus tachycardia Non-specific intra-ventricular conduction block Lateral infarct , age undetermined Abnormal ECG When compared with ECG of 23-Oct-2022 13:34, PR interval has decreased Lateral infarct is now Present ST now depressed in Inferior leads T wave amplitude has increased in Inferior leads Nonspecific T wave abnormality no longer evident in Lateral leads when compared to prior, concern for VT vs wide afib with RVR recurrence/. No STEMI Confirmed by Theda Belfast ([id number]) on 10/27/2022 10:38:26 AM  Radiology No results found.  Procedures Procedures    CRITICAL CARE Performed by: Canary Brim Rexann Lueras Total critical care  time: 60 minutes Critical care time was exclusive of separately billable procedures and treating other patients. Critical care was necessary to treat or prevent imminent or life-threatening deterioration. Critical care was time spent personally by me on the following activities: development of treatment plan with patient and/or surrogate as well as nursing, discussions with consultants, evaluation of patient's response to treatment, examination of patient, obtaining history from patient or surrogate, ordering and performing treatments and interventions, ordering and review of laboratory studies, ordering and review of radiographic studies, pulse oximetry and re-evaluation of patient's condition.  Medications Ordered in ED Medications  adenosine (ADENOCARD) 6 MG/2ML injection 6 mg (has no administration in time range)  amiodarone (NEXTERONE PREMIX) 360-4.14 MG/200ML-% (1.8 mg/mL) IV infusion (has no administration in time range)  amiodarone (NEXTERONE PREMIX) 360-4.14 MG/200ML-% (1.8 mg/mL) IV infusion (has no administration in time range)  norepinephrine (LEVOPHED) 4mg  in (0.016 mg/mL) premix infusion (has no administration in time range)  calcium gluconate inj 10% (1 g) URGENT USE ONLY! (1 g Intravenous Given 10/27/22 1132)  albuterol (PROVENTIL) (2.5 MG/3ML) 0.083% nebulizer solution 10 mg (10 mg Nebulization Given 10/27/22 1143)  insulin aspart (novoLOG) injection 5 Units (5 Units Intravenous Given 10/27/22 1134)    And  dextrose 50 % solution 50 mL (50 mLs Intravenous Given 10/27/22 1132)  metoCLOPramide (REGLAN) injection 5 mg (5 mg Intravenous Given 10/27/22 1210)  furosemide (LASIX) injection 80 mg (80 mg Intravenous Given 10/27/22 1220)    ED Course/ Medical Decision Making/ A&P                                 Medical Decision Making Amount and/or Complexity of Data Reviewed Labs: ordered. Radiology: ordered.  Risk OTC drugs. Prescription drug management. Decision regarding  hospitalization.    Harold Park is a 57 y.o. male with a past medical history significant for CHF with recent EF  of 25%, atrial flutter on Eliquis therapy, hypertension, dilated aortic root, and sleep apnea who presents with shortness of breath and fatigue.  According to patient, his recently admitted for about a week and was discharged 3 days ago on Friday after multiple cardioversion attempts.  He reportedly went to cardiogenic shock after one of the attempts.  He had a good Friday but then starting Saturday yesterday and today has had more difficulty breathing with agitation and feeling like he is holding onto fluid in his chest and abdomen.  He is denying focal chest or abdominal pain but is increased work of breathing and cannot lay down.  He is not having palpitations but on arrival was found to have heart rate in the 130s that appears to show a wide-complex tachycardia such as V. tach versus A-fib with RVR with wide-complex.  He is having difficult time finding a position of comfort.  Otherwise he has had some mild cough but denies fevers.  Denies any vomiting but had some mild nausea.  Denies any other GI or GU symptoms.  On exam, patient appears uncomfortable.  He has some pallor and appears very ill.  He does have a slight murmur and his pulse is fast.  He has rales in the lung spaces but does not have rhonchi.  Chest was nontender.  Abdomen nontender.  He did have palpable pulses.  Legs are not significantly edematous and unchanged from his baseline he reports.  Patient answering questions but appears slightly somnolent.  Clinically I am very concerned as his EKG shows his wide-complex tachycardia.  Will get screening labs, portable chest x-ray, and due to his recent cardiogenic shock after cardioversion attempt, will call cardiology before any intervention.         11:07 AM Spoke with Dr. Salena Saner with cardiology who reviewed the case and EKG.  They think this could be an SVT versus wider a  flutter versus VT.  They requested we do a 6 mg adenosine push while he was having a continuous EKG collected to further evaluate.  They want [place] to start an amiodarone drip as well and if pressure starts to decline start him on nor epi drip.  They agree with ED to ED transfer to [facility] as soon as possible after we attempt the adenosine so that they can see him and likely admit.  Will wait for labs as well but will plan for admission of the patient after ED to ED transfer.  11:29 AM Prior to the adenosine attempt, patient's electrolytes began to return.  Potassium is found to be 6.0.  Given our concern for the wide-complex, I suspect that his hyperkalemia causing his 2-1 a flutter RVR to show the current appearance.  Will update cardiology but will give hyperkalemia meds for emergent treatment including calcium gluconate, insulin, and albuterol.  If creatinine is significantly higher, will likely involve nephrology as well.  Will likely need to involve critical care now.  Creatinine only slightly higher than it was before at 2.3 from 1.9.  I spoke again to cardiology via secure chat who agrees with IV Lasix and if needed give pressors.  They agreed with medicine admission due to the electrolyte abnormality.  Spoke to critical care who agreed to admit the patient.  Will give IV Lasix and pressors if needed but currently not needing it.  Remains on oxygen for hypoxic respiratory failure.  He will be admitted for further management.  1:19 PM After hyperkalemia intervention, his QRS is shortening.  Heart rate is now in the 120s and is more narrow.  New EKG did show some concern for MI so I spoke to cardiology who looked at it and they do not think this is a STEMI.  Patient will be admitted as previously planned.         Final Clinical Impression(s) / ED Diagnoses Final diagnoses:  Hyperkalemia  Cardiac arrhythmia, unspecified cardiac arrhythmia type  Shortness of breath  Hypoxia    Clinical  Impression: 1. Hyperkalemia   2. Cardiac arrhythmia, unspecified cardiac arrhythmia type   3. Shortness of breath   4. Hypoxia     Disposition: Admit  This note was prepared with assistance of Dragon voice recognition software. Occasional wrong-word or sound-a-like substitutions may have occurred due to the inherent limitations of voice recognition software.      Dmoni Fortson, Canary Brim, MD 10/27/22 [phone]

## 2022-10-27 NOTE — ED Notes (Addendum)
Report called to Neysa Bonito, RN - inpatient unit RN.

## 2022-10-27 NOTE — ED Notes (Signed)
Pt c/o nausea. Dr. Sherry Ruffing notified

## 2022-10-27 NOTE — Progress Notes (Signed)
Received patient from transport. Placed on monitor showing Afib/flutter with wide complexes, rate 130-140. Labored breathing, states he needs to sit at side of bed. O2 @ 3LNC. See flowsheet for full assessment. Continuously monitoring. Wife at bedside.

## 2022-10-27 NOTE — Telephone Encounter (Signed)
Currently taking Torsemide 10 mg BID. Increase to 20 mg BID X 2 days, then go back to usual dosing. Increase potassium from 20 mEq daily to 20 BID X 2 days then go back to usual dosing.  Has he been using scopolamine patches and zofran for nausea? He's had nausea from amiodarone before.  Will need sooner follow-up if not improving next few days.

## 2022-10-27 NOTE — Progress Notes (Signed)
Critical care attending attestation note:  Patient seen and examined and relevant ancillary tests reviewed.  I agree with the assessment and plan of care as outlined by Micheline Chapman, MD.   Synopsis of assessment and plan:  Acute decompensated systolic heart failure due to atrial fibrillation with rapid ventricular response. Known NICM, possibly tachycardia induced with EF 35%. Repeated attempts at cardioversion during last admission last week. Patient was finally well enough to go home 8/16 but symptoms recurred 24h after discharge.  HR in 130, with transient improvement with amiodarone. EKG consistent with Afib with aberrancy. On exam, obese man in moderate distress. No crackles, JVP not visible, HS distant, trace edema. Delayed capillary refill.  Hyperkalemia 5.7, Creat 2.23, BNP 1723. HB 19.4 - Diuresis - Continue amiodarone IV - Check ScvO2, start milrinone per HF - Encourage CPAP use for OSA (polycythemia indicates likely non-adherence)  CRITICAL CARE Performed by: Lynnell Catalan   Total critical care time: 40 minutes  Critical care time was exclusive of separately billable procedures and treating other patients.  Critical care was necessary to treat or prevent imminent or life-threatening deterioration.  Critical care was time spent personally by me on the following activities: development of treatment plan with patient and/or surrogate as well as nursing, discussions with consultants, evaluation of patient's response to treatment, examination of patient, obtaining history from patient or surrogate, ordering and performing treatments and interventions, ordering and review of laboratory studies, ordering and review of radiographic studies, pulse oximetry, re-evaluation of patient's condition and participation in multidisciplinary rounds.  Lynnell Catalan, MD [facility] ICU Physician [facility] Critical [facility]  Pager: [phone] Mobile: [phone] After hours: [phone].  10/27/2022,  4:45 PM

## 2022-10-27 NOTE — Telephone Encounter (Signed)
Harold Park reports he already increased torsemide yesterday without relief. Very uncomfortable   Reports she will take him to ER for further evaluation.   Above reviewed with Lindsay,PA

## 2022-10-27 NOTE — ED Notes (Signed)
Amiodarone and Levophed not administered in ED, sent via [facility] for use by transport or inpatient unit as needed.

## 2022-10-27 NOTE — H&P (Signed)
NAME:  Harold Park, MRN:  [id number], DOB:  December 22, 1965, LOS: 0 ADMISSION DATE:  10/27/2022, CONSULTATION DATE:  10/27/22 REFERRING MD:  Dr. Rudolpho Sevin , CHIEF COMPLAINT:  shortness of breath   History of Present Illness:  Harold Park is a 57 y.o. male with PMHx including NICM/HFrEF (EF 25-30% 10/15/22), PAF on Eliquis, PVCs, severe OSA, pulmonary Sarcoid, obesity, HTN, chronic polycythemia   Per chart review: Patient presented to [facility] due to shortness of breath and fatigue.  The patient was noted to have wide-complex tachycardia thought to be either SVT versus a flutter versus VT.  Cardiology was consulted in the ED and recommended 6 mg adenosine push as well as amiodarone drip.  However, patient was subsequently noted to be hyperkalemic to 6.0 and was given albuterol, calcium gluconate, and insulin.  BNP 1723.3, patient received IV Lasix 80 mg x 1.  Patient was also noted to be in hypoxic respiratory failure requiring 2 L nasal cannula.  Upon arrival at [facility], pt started on amiodarone drip.  Notably, patient was recently admitted 8/6-8/16 due to A-fib with RVR.  During that admission, he underwent cardioversion in the ED and subsequently developed shock.  He was seen by EP and started on IV amiodarone, then had recurrent A-fib with RVR.  DCCV was attempted x 2 on 8/11 but unsuccessful.  Repeat cardioversion on 8/15 was successful in converting him into sinus rhythm.  Patient was discharged 8/16 on amiodarone taper with plans for eventual ablation with EP.  Pt reports:  Around midnight 8/19 started feeling short of breath, fatigued, and as if he was holding on to a lot of fluid in his belly. Took AM dose of torsemide but did not take any other meds today. Denies current SOB, chest pain, palpitations, abdominal pain, nausea. Denies recent syncope, fevers, cough, diarrhea/vomiting, drug/alcohol use.   Pertinent  Medical History  NICM/HFrEF (EF 25-30% 10/15/22) PAF on Eliquis Hx  PVCs severe OSA pulmonary Sarcoid Obesity HTN chronic polycythemia (thought to be 2/2 severe OSA and chronic diuresis)  Significant Hospital Events: Including procedures, antibiotic start and stop dates in addition to other pertinent events   8/19 admitted to CVICU   Interim History / Subjective:   Objective   Blood pressure (!) 111/48, pulse (!) 118, temperature (!) 97.4 F (36.3 C), temperature source Oral, resp. rate (!) 29, SpO2 100%.       No intake or output data in the 24 hours ending 10/27/22 1654 There were no vitals filed for this visit.  Examination: General: NAD, laying flat in bed HENT: [place]/AT, EOMI, unable to visualize JVD due to body habitus Lungs: Diminished bilateral bases, otherwise good air movement bilaterally with comfortable WOB on 2L [place] Cardiovascular: tachycardic, irregularly irregular rhythm Abdomen: distended, soft, non-tender to palpation Extremities: trace-1+ pitting edema b/l lower extremities to calves Neuro: no focal deficits, alert  Resolved Hospital Problem list    Assessment & Plan:   Wide complex tachycardia Recurrent AFL w/ RVR refractory to PO amiodarone Admit to ICU Recently admitted 8/6-8/16 and developed cardiogenic shock after DCCV -Appreciate cardiology, EP to see -Amiodarone gtt -Heparin gtt -TSH elevated 8.9, f/u T3/T4  Acute on chronic HFrEF  Acute hypoxemic respiratory failure ?Pneumonia BNP 1723.3, higher than previous levels Abdomen distended, trace edema BLE S/p IV Lasix 80mg  x1 at [facility] ED CXR with cardiomegaly, focal airspace opacity right middle lung field Leukocytosis 14.2 TSH elevated, T3/T4 pending -Appreciate cardiology -Wean O2 as tolerated -on amio, heparin as above -milrinone -f/u  procalcitonin, lactate -Start empiric ceftriaxone for ?CAP   Hyperkalemia AKI on CKD 3b S/p albuterol, calcium gluconate, insulin in ED. Lokelma x1 upon admission -monitor BMP -cardiac monitoring -on milrinone    OSA -CPAP nightly   Best Practice (right click and "Reselect all SmartList Selections" daily)   Diet/type: Regular consistency (see orders) DVT prophylaxis: heparin GI prophylaxis: N/A Lines: N/A Foley:  N/A Code Status:  full code Last date of multidisciplinary goals of care discussion [n/a]  Labs   CBC: Recent Labs  Lab 10/23/22 0153 10/24/22 0609 10/27/22 1042  WBC 14.3* 12.4* 14.2*  NEUTROABS  --   --  10.8*  HGB 17.2* 17.7* 19.4*  HCT 51.4 52.8* 58.0*  MCV 85.1 83.9 87.5  PLT 163 181 214    Basic Metabolic Panel: Recent Labs  Lab 10/21/22 0138 10/22/22 0549 10/23/22 0153 10/24/22 0609 10/27/22 1042 10/27/22 1534  NA 129* 133* 129* 131* 132* 136  K 3.7 3.2* 3.8 3.7 6.0* 5.7*  CL 92* 96* 91* 94* 100 102  CO2 22 25 20* 26 19* 15*  GLUCOSE 323* 127* 306* 112* 164* 114*  BUN 43* 46* 43* 42* 42* 40*  CREATININE 2.24* 2.07* 2.11* 1.92* 2.35* 2.23*  CALCIUM 7.9* 8.4* 7.8* 8.5* 9.2 9.3  MG 2.1 2.1 2.0 2.3 2.5*  --    GFR: Estimated Creatinine Clearance: 56.4 mL/min (A) (by C-G formula based on SCr of 2.23 mg/dL (H)). Recent Labs  Lab 10/23/22 0153 10/24/22 0609 10/27/22 1042  WBC 14.3* 12.4* 14.2*    Liver Function Tests: Recent Labs  Lab 10/27/22 1042  AST 152*  ALT 114*  ALKPHOS 91  BILITOT 4.8*  PROT 6.7  ALBUMIN 3.8   Recent Labs  Lab 10/27/22 1042  LIPASE 14   No results for input(s): "AMMONIA" in the last 168 hours.  ABG    Component Value Date/Time   PHART 7.401 01/28/2010 0959   PCO2ART 34.0 (L) 01/28/2010 0959   PO2ART 78.0 (L) 01/28/2010 0959   HCO3 21.1 01/28/2010 0959   TCO2 27 10/02/2022 1223   ACIDBASEDEF 3.0 (H) 01/28/2010 0959   O2SAT 68.4 10/24/2022 0609     Coagulation Profile: No results for input(s): "INR", "PROTIME" in the last 168 hours.  Cardiac Enzymes: No results for input(s): "CKTOTAL", "CKMB", "CKMBINDEX", "TROPONINI" in the last 168 hours.  HbA1C: Hgb A1c MFr Bld  Date/Time Value Ref Range  Status  10/21/2022 10:53 AM 6.4 (H) 4.8 - 5.6 % Final    Comment:    (NOTE)         Prediabetes: 5.7 - 6.4         Diabetes: >6.4         Glycemic control for adults with diabetes: <7.0   02/02/2019 10:45 AM 5.8 (H) 4.8 - 5.6 % Final    Comment:    (NOTE) Pre diabetes:          5.7%-6.4% Diabetes:              >6.4% Glycemic control for   <7.0% adults with diabetes     CBG: Recent Labs  Lab 10/23/22 1530 10/23/22 2104 10/24/22 0611 10/24/22 1150 10/27/22 1128  GLUCAP 143* 125* 126* 112* 142*    Review of Systems:   Per HPI  Past Medical History:  He,  has a past medical history of Chronic bronchitis, HTN (hypertension), NICM (nonischemic cardiomyopathy) (HCC), Obesity (BMI 30-39.9) (12/04/2015), OSA (obstructive sleep apnea) (08/19/2015), PAF (paroxysmal atrial fibrillation) (HCC), and Systolic CHF, chronic (HCC).  Surgical History:   Past Surgical History:  Procedure Laterality Date   APPENDECTOMY     BRONCHIAL WASHINGS  04/24/2020   Procedure: BRONCHIAL WASHINGS;  Surgeon: Leslye Peer, MD;  Location: [facility] ENDOSCOPY;  Service: Pulmonary;;   CARDIAC CATHETERIZATION     CARDIOVERSION N/A 06/19/2017   Procedure: CARDIOVERSION;  Surgeon: Dolores Patty, MD;  Location: [facility] ENDOSCOPY;  Service: Cardiovascular;  Laterality: N/A;   CARDIOVERSION N/A 10/02/2022   Procedure: CARDIOVERSION;  Surgeon: Dolores Patty, MD;  Location: [facility] INVASIVE CV LAB;  Service: Cardiovascular;  Laterality: N/A;   CARDIOVERSION N/A 10/20/2022   Procedure: CARDIOVERSION;  Surgeon: Dolores Patty, MD;  Location: [facility] INVASIVE CV LAB;  Service: Cardiovascular;  Laterality: N/A;   CARDIOVERSION N/A 10/23/2022   Procedure: CARDIOVERSION;  Surgeon: Dolores Patty, MD;  Location: [facility] INVASIVE CV LAB;  Service: Cardiovascular;  Laterality: N/A;   FINE NEEDLE ASPIRATION  04/24/2020   Procedure: FINE NEEDLE ASPIRATION (FNA) LINEAR;  Surgeon: Leslye Peer, MD;  Location: [facility] ENDOSCOPY;   Service: Pulmonary;;   LIPOMA RESECTION     arms   SPLENECTOMY     SPLIT NIGHT STUDY  08/12/2015   VIDEO BRONCHOSCOPY WITH ENDOBRONCHIAL ULTRASOUND N/A 04/24/2020   Procedure: VIDEO BRONCHOSCOPY WITH ENDOBRONCHIAL ULTRASOUND;  Surgeon: Leslye Peer, MD;  Location: [facility] ENDOSCOPY;  Service: Pulmonary;  Laterality: N/A;     Social History:   reports that he has never smoked. He has never used smokeless tobacco. He reports current alcohol use. He reports that he does not use drugs.   Family History:  His family history includes Breast cancer in his mother; Cancer in his father; Heart disease in an other family member; Hypertension in an other family member. There is no history of Stomach cancer, Colon cancer, Esophageal cancer, Pancreatic cancer, or Rectal cancer.   Allergies Allergies  Allergen Reactions   Bee Venom Anaphylaxis   Amiodarone Nausea Only   Spironolactone      Home Medications  Prior to Admission medications   Medication Sig Start Date End Date Taking? Authorizing Provider  albuterol (VENTOLIN HFA) 108 (90 Base) MCG/ACT inhaler TAKE 2 PUFFS BY MOUTH EVERY 6 HOURS AS NEEDED FOR WHEEZE OR SHORTNESS OF BREATH 02/24/22   Leslye Peer, MD  amiodarone (PACERONE) 200 MG tablet Take 400 mg twice a day x 7 days then 200 mg twice a day 10/24/22   Clegg, Amy D, NP  DULoxetine (CYMBALTA) 20 MG capsule TAKE 1 CAPSULE BY MOUTH EVERY DAY 11/12/21   Bensimhon, Bevelyn Buckles, MD  ELIQUIS 5 MG TABS tablet TAKE 1 TABLET BY MOUTH TWICE A DAY 10/10/22   Bensimhon, Bevelyn Buckles, MD  Magnesium 250 MG TABS Take 1 tablet by mouth 2 (two) times daily.    [provider]  methocarbamol (ROBAXIN) 500 MG tablet Take 500 mg by mouth 2 (two) times daily as needed for muscle spasms.    [provider]  metoCLOPramide (REGLAN) 5 MG tablet Take 1 tablet (5 mg total) by mouth 3 (three) times daily. 10/24/22 10/24/23  Clegg, Amy D, NP  NEEDLE, DISP, 20 G (BD DISP NEEDLES) 20G X 1-1/2" MISC Use as  directed 11/29/21   [name], Elberta Fortis, MD  NEEDLE, DISP, 22 G 22G X 1" MISC Use to inject testosterone into the muscle once every 14 days. 10/19/20   [name], Elberta Fortis, MD  ondansetron (ZOFRAN) 4 MG tablet Take 1 tablet (4 mg total) by mouth every 8 (eight) hours as needed  for nausea or vomiting. 10/24/22 10/24/23  Clegg, Amy D, NP  potassium chloride SA (KLOR-CON M20) 20 MEQ tablet TAKE 4 TABLETS BY MOUTH EVERY MORNING AND 2 TABLETS DAILY W/ LUNCH AND 4 TABS EVERY EVENING. Patient taking differently: TAKE 4 TABLETS BY MOUTH EVERY MORNING AND 2 TABLETS DAILY W/ LUNCH AND 4 TABS EVERY EVENING.  Patient states he will start back taking his Potassium- Taking 1 tablet by mouth daily 02/11/22   Bensimhon, Bevelyn Buckles, MD  scopolamine (TRANSDERM-SCOP) 1 MG/3DAYS Place 1 patch (1.5 mg total) onto the skin every 3 (three) days. 10/25/22   Clegg, Amy D, NP  sildenafil (VIAGRA) 100 MG tablet TAKE 1 TABLET BY MOUTH EVERY DAY AS NEEDED FOR ERECTILE DYSFUNCTION 10/22/21   Bensimhon, Bevelyn Buckles, MD  testosterone cypionate (DEPOTESTOSTERONE CYPIONATE) 200 MG/ML injection Inject 1.5 mLs (300 mg total) into the muscle every 14 (fourteen) days. 11/29/21   [name], Elberta Fortis, MD  torsemide (DEMADEX) 20 MG tablet Take 0.5 tablets (10 mg total) by mouth 2 (two) times daily. 10/01/22   Sherald Hess, NP     Critical care time:

## 2022-10-27 NOTE — Telephone Encounter (Signed)
Discharged 8/16  Over the past 24-48 hours -reports unable to lay down restless -SOB -Nausea--->unable to take AM med 8/19 -Belly full (abdomen distended)  HR 84 B/P 112/78 weight 316lb   Please advise

## 2022-10-27 NOTE — CV Procedure (Signed)
Central Venous Catheter Insertion Procedure Note Harold Park [id number] Mar 27, 1965    Procedure: Insertion of Central Venous Catheter Indications: Drug and/or fluid administration   Procedure Details Consent: Risks of procedure as well as the alternatives and risks of each were explained to the (patient/caregiver).  Consent for procedure obtained. Time Out: Verified patient identification, verified procedure, site/side was marked, verified correct patient position, special equipment/implants available, medications/allergies/relevent history reviewed, required imaging and test results available.  Performed   Maximum sterile technique was used including antiseptics, cap, gloves, gown, hand hygiene, mask and sheet. Skin prep: Chlorhexidine; local anesthetic administered A antimicrobial bonded/coated triple lumen catheter was placed in the right internal jugular vein using the Seldinger technique and u/s guidance.   Evaluation Blood flow good Complications: No apparent complications Patient did tolerate procedure well. Chest X-ray ordered to verify placement.  CXR: pending   Arvilla Meres, MD  5:24 PM

## 2022-10-27 NOTE — Progress Notes (Signed)
eLink Physician-Brief Progress Note Patient Name: Harold Park DOB: 12/12/1965 MRN: [id number]   Date of Service  10/27/2022  HPI/Events of Note  57yo male that came in with decompensated HF and afib. Having nausea  eICU Interventions  Add zofran     Intervention Category Minor Interventions: Routine modifications to care plan (e.g. PRN medications for pain, fever)  Blanchard Harold Park 10/27/2022, 10:28 PM

## 2022-10-28 ENCOUNTER — Ambulatory Visit (HOSPITAL_COMMUNITY): Payer: BC Managed Care – PPO | Admitting: Internal Medicine

## 2022-10-28 ENCOUNTER — Inpatient Hospital Stay (HOSPITAL_COMMUNITY): Payer: BC Managed Care – PPO

## 2022-10-28 ENCOUNTER — Inpatient Hospital Stay: Payer: BC Managed Care – PPO | Admitting: Family Medicine

## 2022-10-28 DIAGNOSIS — R570 Cardiogenic shock: Secondary | ICD-10-CM | POA: Diagnosis not present

## 2022-10-28 DIAGNOSIS — Z1152 Encounter for screening for COVID-19: Secondary | ICD-10-CM | POA: Diagnosis not present

## 2022-10-28 DIAGNOSIS — I4891 Unspecified atrial fibrillation: Secondary | ICD-10-CM | POA: Diagnosis not present

## 2022-10-28 DIAGNOSIS — I5021 Acute systolic (congestive) heart failure: Secondary | ICD-10-CM

## 2022-10-28 DIAGNOSIS — E875 Hyperkalemia: Secondary | ICD-10-CM | POA: Diagnosis not present

## 2022-10-28 DIAGNOSIS — I483 Typical atrial flutter: Secondary | ICD-10-CM

## 2022-10-28 DIAGNOSIS — I484 Atypical atrial flutter: Secondary | ICD-10-CM | POA: Diagnosis not present

## 2022-10-28 DIAGNOSIS — I5023 Acute on chronic systolic (congestive) heart failure: Secondary | ICD-10-CM | POA: Diagnosis not present

## 2022-10-28 DIAGNOSIS — J189 Pneumonia, unspecified organism: Secondary | ICD-10-CM | POA: Diagnosis not present

## 2022-10-28 LAB — IRON AND TIBC
Iron: 22 ug/dL — ABNORMAL LOW (ref 45–182)
Saturation Ratios: 8 % — ABNORMAL LOW (ref 17.9–39.5)
TIBC: 294 ug/dL (ref 250–450)
UIBC: 272 ug/dL

## 2022-10-28 LAB — CBC
HCT: 50 % (ref 39.0–52.0)
Hemoglobin: 17 g/dL (ref 13.0–17.0)
MCH: 29.4 pg (ref 26.0–34.0)
MCHC: 34 g/dL (ref 30.0–36.0)
MCV: 86.4 fL (ref 80.0–100.0)
Platelets: 201 10*3/uL (ref 150–400)
RBC: 5.79 MIL/uL (ref 4.22–5.81)
RDW: 16.6 % — ABNORMAL HIGH (ref 11.5–15.5)
WBC: 21.3 10*3/uL — ABNORMAL HIGH (ref 4.0–10.5)
nRBC: 0.1 % (ref 0.0–0.2)

## 2022-10-28 LAB — ECHOCARDIOGRAM COMPLETE
AR max vel: 3.62 cm2
AV Peak grad: 2.2 mmHg
Ao pk vel: 0.75 m/s
Area-P 1/2: 3.3 cm2
Calc EF: 27.4 %
MV M vel: 3.49 m/s
MV Peak grad: 48.7 mmHg
S' Lateral: 5.5 cm
Single Plane A2C EF: 25.4 %
Single Plane A4C EF: 28.9 %
Weight: 5255.77 oz

## 2022-10-28 LAB — T4, FREE: Free T4: 1.22 ng/dL — ABNORMAL HIGH (ref 0.61–1.12)

## 2022-10-28 LAB — COOXEMETRY PANEL
Carboxyhemoglobin: 1.9 % — ABNORMAL HIGH (ref 0.5–1.5)
Methemoglobin: 0.9 % (ref 0.0–1.5)
O2 Saturation: 79.1 %
Total hemoglobin: 17 g/dL — ABNORMAL HIGH (ref 12.0–16.0)

## 2022-10-28 LAB — APTT
aPTT: 46 s — ABNORMAL HIGH (ref 24–36)
aPTT: 82 seconds — ABNORMAL HIGH (ref 24–36)
aPTT: 93 seconds — ABNORMAL HIGH (ref 24–36)

## 2022-10-28 LAB — BASIC METABOLIC PANEL
Anion gap: 12 (ref 5–15)
BUN: 42 mg/dL — ABNORMAL HIGH (ref 6–20)
CO2: 20 mmol/L — ABNORMAL LOW (ref 22–32)
Calcium: 8.2 mg/dL — ABNORMAL LOW (ref 8.9–10.3)
Chloride: 98 mmol/L (ref 98–111)
Creatinine, Ser: 2.25 mg/dL — ABNORMAL HIGH (ref 0.61–1.24)
GFR, Estimated: 33 mL/min — ABNORMAL LOW (ref >60)
Glucose, Bld: 130 mg/dL — ABNORMAL HIGH (ref 70–99)
Potassium: 5 mmol/L (ref 3.5–5.1)
Sodium: 130 mmol/L — ABNORMAL LOW (ref 135–145)

## 2022-10-28 LAB — RETICULOCYTES
Immature Retic Fract: 15.5 % (ref 2.3–15.9)
RBC.: 5.81 MIL/uL (ref 4.22–5.81)
Retic Count, Absolute: 116.5 10*3/uL (ref 19.0–186.0)
Retic Ct Pct: 2.2 % (ref 0.4–3.1)

## 2022-10-28 LAB — LACTIC ACID, PLASMA: Lactic Acid, Venous: 1.9 mmol/L (ref 0.5–1.9)

## 2022-10-28 LAB — FERRITIN: Ferritin: 345 ng/mL — ABNORMAL HIGH (ref 24–336)

## 2022-10-28 LAB — VITAMIN B12: Vitamin B-12: 864 pg/mL (ref 180–914)

## 2022-10-28 LAB — MAGNESIUM: Magnesium: 2.3 mg/dL (ref 1.7–2.4)

## 2022-10-28 LAB — MRSA NEXT GEN BY PCR, NASAL: MRSA by PCR Next Gen: NOT DETECTED

## 2022-10-28 LAB — FOLATE: Folate: 14.1 ng/mL (ref >5.9)

## 2022-10-28 LAB — HEPARIN LEVEL (UNFRACTIONATED): Heparin Unfractionated: >1.1 [IU]/mL — ABNORMAL HIGH (ref 0.30–0.70)

## 2022-10-28 MED ORDER — ORAL CARE MOUTH RINSE: 15.0000 mL | OROMUCOSAL | Status: DC | PRN

## 2022-10-28 MED ORDER — PERFLUTREN LIPID MICROSPHERE
1.0000 mL | INTRAVENOUS | Status: AC | PRN
  Administered 2022-10-28: 4 mL via INTRAVENOUS

## 2022-10-28 MED ORDER — AMIODARONE HCL IN DEXTROSE 360-4.14 MG/200ML-% IV SOLN
60.0000 mg/h | INTRAVENOUS | Status: AC
  Administered 2022-10-28: 60 mg/h via INTRAVENOUS
  Filled 2022-10-28: qty 200

## 2022-10-28 MED ORDER — CHLORHEXIDINE GLUCONATE CLOTH 2 % EX PADS
6.0000 | MEDICATED_PAD | Freq: Every day | CUTANEOUS | Status: DC
  Administered 2022-10-28 – 2022-11-08 (×12): 6 via TOPICAL

## 2022-10-28 MED ORDER — ACETAZOLAMIDE 250 MG PO TABS
250.0000 mg | ORAL_TABLET | Freq: Once | ORAL | Status: AC
  Administered 2022-10-28: 250 mg via ORAL
  Filled 2022-10-28: qty 1

## 2022-10-28 MED ORDER — ACETAMINOPHEN 325 MG PO TABS
650.0000 mg | ORAL_TABLET | Freq: Four times a day (QID) | ORAL | Status: DC | PRN
  Administered 2022-10-28 – 2022-11-07 (×9): 650 mg via ORAL
  Filled 2022-10-28 (×9): qty 2

## 2022-10-28 MED ORDER — AMIODARONE HCL IN DEXTROSE 360-4.14 MG/200ML-% IV SOLN: 30.0000 mg/h | INTRAVENOUS | Status: DC

## 2022-10-28 NOTE — Progress Notes (Signed)
ANTICOAGULATION CONSULT NOTE - Follow Up Consult  Pharmacy Consult for heparin Indication: atrial fibrillation  Labs: Recent Labs    10/27/22 1042 10/27/22 1048 10/27/22 1242 10/27/22 1534 10/27/22 1955 10/28/22 0007  HGB 19.4*  --   --   --   --   --   HCT 58.0*  --   --   --   --   --   PLT 214  --   --   --   --   --   APTT  --   --   --   --   --  46*  HEPARINUNFRC  --   --   --   --   --  >1.10*  CREATININE 2.35*  --   --  2.23* 2.36*  --   TROPONINIHS  --  35* 34*  --   --   --     Assessment: 57yo male subtherapeutic on heparin with initial dosing while apixaban on hold; no infusion issues or signs of bleeding per RN.  Goal of Therapy:  aPTT 66-102 seconds   Plan:  Increase heparin infusion by 2 units/kg/hr to 1900 units/hr. Check level in 6-8 hours.   Vernard Gambles, PharmD, BCPS 10/28/2022 1:15 AM

## 2022-10-28 NOTE — Progress Notes (Signed)
ANTICOAGULATION CONSULT NOTE -   Pharmacy Consult for IV Heparin Indication: atrial fibrillation  Allergies  Allergen Reactions   Bee Venom Anaphylaxis   Amiodarone Nausea Only   Spironolactone     Patient Measurements: Weight: (!) 149 kg (328 lb 7.8 oz) Heparin Dosing Weight: ~ 120 kg  Vital Signs: Temp: 98.1 F (36.7 C) (08/20 0750) Temp Source: Oral (08/20 0750) BP: 95/69 (08/20 0630) Pulse Rate: 114 (08/20 0630)  Labs: Recent Labs    10/27/22 1042 10/27/22 1048 10/27/22 1242 10/27/22 1534 10/27/22 1955 10/28/22 0007 10/28/22 0440 10/28/22 0828  HGB 19.4*  --   --   --   --   --  17.0  --   HCT 58.0*  --   --   --   --   --  50.0  --   PLT 214  --   --   --   --   --  201  --   APTT  --   --   --   --   --  46*  --  82*  HEPARINUNFRC  --   --   --   --   --  >1.10*  --   --   CREATININE 2.35*  --   --  2.23* 2.36*  --  2.25*  --   TROPONINIHS  --  35* 34*  --   --   --   --   --     Estimated Creatinine Clearance: 57.2 mL/min (A) (by C-G formula based on SCr of 2.25 mg/dL (H)).   Medical History: Past Medical History:  Diagnosis Date   Chronic bronchitis    HTN (hypertension)    NICM (nonischemic cardiomyopathy) (HCC)    Obesity (BMI 30-39.9) 12/04/2015   OSA (obstructive sleep apnea) 08/19/2015   Severe with AHI 64/hr now on CPAP at 13cm H2O   PAF (paroxysmal atrial fibrillation) (HCC)    Systolic CHF, chronic (HCC)     Medications:  Infusions:   sodium chloride     amiodarone 30 mg/hr (10/28/22 0800)   cefTRIAXone (ROCEPHIN)  IV Stopped (10/27/22 1902)   furosemide 120 mg (10/28/22 0837)   heparin 1,900 Units/hr (10/28/22 0800)   milrinone 0.25 mcg/kg/min (10/28/22 1001)   norepinephrine (LEVOPHED) Adult infusion      Assessment: 57 yo male on chronic Eliquis for afib.  Did not take this morning's dose PTA.    Pharmacy asked to start IV heparin 8/19 while Eliquis is on hold in case of procedures.  APTT this morning is within goal range at  82 seconds.  No overt bleeding or complications noted.  CBC stable.  Heparin levels remain falsely elevated from recent Eliquis use.  Goal of Therapy:  Heparin level 0.3-0.7 units/ml APTT 66-102 Monitor platelets by anticoagulation protocol: Yes   Plan:  Continue IV heparin at 1900 units/hr Confirm aPTT this afternoon. Daily aPTT, heparin level and CBC.  Reece Leader, Colon Flattery, [facility] Clinical Pharmacist  10/28/2022 10:55 AM   [facility] pharmacy phone numbers are listed on [email or web address]

## 2022-10-28 NOTE — Consult Note (Signed)
Cardiology Consultation   Patient ID: [place] MRN: [id number]; DOB: June 15, 1965  Admit date: 10/27/2022 Date of Consult: 10/28/2022  PCP:  Kristian Covey, MD   [facility] Providers Cardiologist:  Armanda Magic, MD  Sleep Medicine:  Armanda Magic, MD  { AHF: Dr. Gala Romney  Patient Profile:   Harold Park is a 57 y.o. 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 Gala Romney.   Cardiac Hx Cath 2011 with normal coronaries Dec 2021Coronary CT ca score zero  Poor coronary images with motion artifact. I suspect that there is no significant coronary disease, but cannot be definitive due to low study quality.   C.MRI 2014 Impression:  1)    Moderat LVE with mild LVH Diffuse hypokinesis EF 34%       2)    No infarct or hyperenhancement  3)    Mild RVE  Findings consistent with non ischemic cardiomyopathy    C.MRI March 2017 Impressions:  - Dilated aorta particularly at the sinuses of Valsalva as is  conventionally measured, but when indexed for body surface area  qualitatively normal in size  - Severely dilated left ventricle with severely reduced systolic function  (LVEF = 20%)  - Dilated left and right atria  - Right ventricular volume increased but likely normal when indexed to  body surface area with low normal right ventricular systolic function     LVEF waxed/waned over the years 20-30% <> 40-45% <> 35-40%   CT chest 03/06/20 to evaluate hilar fullness seen on CXR. Interval development of numerous enlarged mediastinal and bilateral hilar lymph nodes  Underwent bronchoscopy with biopsy of mediastinal nodes. 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 Treated with prednisone.    PET 08/08/2021   1. There is no abnormal metabolism to indicate the presence of an active  inflammatory process in the left ventricular        myocardium.   2. There is no abnormal metabolism in the right ventricle.   3. There is no evidence to suggest active cardiac sarcoidosis or other inflammatory process.    Perfusion/Function:   Gated cardiac PET/CT rest myocardial perfusion study with [id number] demonstrates:   1. Abnormal apical myocardial perfusion.   2. Abnormal left ventricular systolic function, wall motion, apical thickening, and dilated LV.    Non-diagnostic CT obtained for PET attenuation correction:   1. There are no discernible coronary artery calcifications.   2. There are areas of extracardiac hypermetabolic activity noted on the limited field of view.This includes mediastinum and       hilar lymphnodes. This could represent extra cardiac sarcoidosis or other process.If clinically concern a diagnostic whole       body PET/CT may be obtained for further evaluation.    AFib/PVCs 2011 Amiodarone poorly tolerated w/nausea (I also see some reports of vertigo-like sx ) 2017 started Ranexa (for PVCs)    History of Present Illness:   Harold Park recently seen by Dr. Nelly Laurence and myself 10/15/22 during that hospital stay with a recurrent reduction in his LEF, priot to that 2017 by Dr. Graciela Husbands for poss ICD the w/u into his CM was still underway  Seeing the AFib clinic a couple time, with recent plans to consider Tikosyn, via their visits not felt to be an ablation candidate  More recently struggling with recurrent  Afib > DCCV 10/02/22 > back in the ER with AFib and DCCV in the ER 10/14/22 and admitted for further management. Planned for amiodarone gtt given his LVEF now is back down to the 20's and reduced RV function as well, with suspect AFib/RVR driving the new reduction. 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 And re-evaluate for ICD +/- recovery of his EF He had 10/20/22 DCCV with only seconds of SR Another DCCV 10/23/22 with successful CV to SR Discharged 10/24/22  Admitted yesterday with with progressive SOB, bloating, orthopnea, CXR suspicious for PNA, BNP 1723, viral panel neg AFib/flutter noted Transferred from [facility] to here with concerns of low output/shock Started on amimodarone gtt, IV lasix, milrinone Rhythm felt to be the driver for his EF and heart failure EP is asked to revisit to consider ablation (in-patient) vs pace/ablate strategy  LABS K+ 6.0 > 5.7 > 5.6 > 5.0 BUN/Creat 42/2.35 > 2.23 > > 2.25 (baseline about 1.9) Mag 2.5, 2.3 AST 152 ALT 114 BNP 1723 Lactic acid 2.8 > 3.1 > 2.8 WBC 14.2 H/H 19/58 >> 17/50 Plts 214  Home meds (rate/rhythm) Amiodarone (400mg  BID x1 week > 200mg  BID) >> amio gtt here  OAC: Eliquis >> heparin gtt here  Milrinone at 0.25 Lasix gtt Diamox Rocephin   He tells me that in the 2 days he spent at home, really almost immediately started to get more SOB, rapidly bloated and felt pretty awful, yesterday feeling weak, lightheaded, a couple times thought he may faint but did not. Reports the PO amiodarone poorly tolerated, pretty nauseous, and was taking antinausea med about every 3 hours in the hospital prior to discharge  Past Medical History:  Diagnosis Date   Chronic bronchitis    HTN (hypertension)    NICM (nonischemic cardiomyopathy) (HCC)    Obesity (BMI 30-39.9) 12/04/2015   OSA (obstructive sleep apnea) 08/19/2015   Severe with AHI 64/hr now on CPAP at 13cm H2O   PAF (paroxysmal atrial fibrillation) (HCC)    Systolic CHF, chronic (HCC)     Past Surgical History:  Procedure Laterality Date    APPENDECTOMY     BRONCHIAL WASHINGS  04/24/2020   Procedure: BRONCHIAL WASHINGS;  Surgeon: Leslye Peer, MD;  Location: [facility] ENDOSCOPY;  Service: Pulmonary;;   CARDIAC CATHETERIZATION     CARDIOVERSION N/A 06/19/2017   Procedure: CARDIOVERSION;  Surgeon: Dolores Patty, MD;  Location: [facility] ENDOSCOPY;  Service: Cardiovascular;  Laterality: N/A;   CARDIOVERSION N/A 10/02/2022   Procedure: CARDIOVERSION;  Surgeon: Dolores Patty, MD;  Location: [facility] INVASIVE CV LAB;  Service: Cardiovascular;  Laterality: N/A;   CARDIOVERSION N/A 10/20/2022   Procedure: CARDIOVERSION;  Surgeon: Dolores Patty, MD;  Location: [facility] INVASIVE CV LAB;  Service: Cardiovascular;  Laterality: N/A;   CARDIOVERSION N/A 10/23/2022   Procedure: CARDIOVERSION;  Surgeon: Dolores Patty, MD;  Location: [facility] INVASIVE CV LAB;  Service: Cardiovascular;  Laterality: N/A;   FINE NEEDLE ASPIRATION  04/24/2020   Procedure: FINE NEEDLE ASPIRATION (FNA) LINEAR;  Surgeon: Leslye Peer, MD;  Location: [facility] ENDOSCOPY;  Service: Pulmonary;;   LIPOMA RESECTION  arms   SPLENECTOMY     SPLIT NIGHT STUDY  08/12/2015   VIDEO BRONCHOSCOPY WITH ENDOBRONCHIAL ULTRASOUND N/A 04/24/2020   Procedure: VIDEO BRONCHOSCOPY WITH ENDOBRONCHIAL ULTRASOUND;  Surgeon: Leslye Peer, MD;  Location: [facility] ENDOSCOPY;  Service: Pulmonary;  Laterality: N/A;     Home Medications:  Prior to Admission medications   Medication Sig Start Date End Date Taking? Authorizing Provider  albuterol (VENTOLIN HFA) 108 (90 Base) MCG/ACT inhaler TAKE 2 PUFFS BY MOUTH EVERY 6 HOURS AS NEEDED FOR WHEEZE OR SHORTNESS OF BREATH 02/24/22   Leslye Peer, MD  amiodarone (PACERONE) 200 MG tablet Take 400 mg twice a day x 7 days then 200 mg twice a day 10/24/22   Clegg, Amy D, NP  DULoxetine (CYMBALTA) 20 MG capsule TAKE 1 CAPSULE BY MOUTH EVERY DAY 11/12/21   Bensimhon, Bevelyn Buckles, MD  ELIQUIS 5 MG TABS tablet TAKE 1 TABLET BY MOUTH TWICE A DAY 10/10/22   Bensimhon, Bevelyn Buckles, MD   Magnesium 250 MG TABS Take 1 tablet by mouth 2 (two) times daily.    [provider]  methocarbamol (ROBAXIN) 500 MG tablet Take 500 mg by mouth 2 (two) times daily as needed for muscle spasms.    [provider]  metoCLOPramide (REGLAN) 5 MG tablet Take 1 tablet (5 mg total) by mouth 3 (three) times daily. 10/24/22 10/24/23  Clegg, Amy D, NP  NEEDLE, DISP, 20 G (BD DISP NEEDLES) 20G X 1-1/2" MISC Use as directed 11/29/21   [name], Elberta Fortis, MD  NEEDLE, DISP, 22 G 22G X 1" MISC Use to inject testosterone into the muscle once every 14 days. 10/19/20   [name], Elberta Fortis, MD  ondansetron (ZOFRAN) 4 MG tablet Take 1 tablet (4 mg total) by mouth every 8 (eight) hours as needed for nausea or vomiting. 10/24/22 10/24/23  Clegg, Amy D, NP  potassium chloride SA (KLOR-CON M20) 20 MEQ tablet TAKE 4 TABLETS BY MOUTH EVERY MORNING AND 2 TABLETS DAILY W/ LUNCH AND 4 TABS EVERY EVENING. Patient taking differently: TAKE 4 TABLETS BY MOUTH EVERY MORNING AND 2 TABLETS DAILY W/ LUNCH AND 4 TABS EVERY EVENING.  Patient states he will start back taking his Potassium- Taking 1 tablet by mouth daily 02/11/22   Bensimhon, Bevelyn Buckles, MD  scopolamine (TRANSDERM-SCOP) 1 MG/3DAYS Place 1 patch (1.5 mg total) onto the skin every 3 (three) days. 10/25/22   Clegg, Amy D, NP  sildenafil (VIAGRA) 100 MG tablet TAKE 1 TABLET BY MOUTH EVERY DAY AS NEEDED FOR ERECTILE DYSFUNCTION 10/22/21   Bensimhon, Bevelyn Buckles, MD  testosterone cypionate (DEPOTESTOSTERONE CYPIONATE) 200 MG/ML injection Inject 1.5 mLs (300 mg total) into the muscle every 14 (fourteen) days. 11/29/21   [name], Elberta Fortis, MD  torsemide (DEMADEX) 20 MG tablet Take 0.5 tablets (10 mg total) by mouth 2 (two) times daily. 10/01/22   Tonye Becket D, NP    Inpatient Medications: Scheduled Meds:  acetaZOLAMIDE  250 mg Oral Once   adenosine (ADENOCARD) IV  6 mg Intravenous Once   Chlorhexidine Gluconate Cloth  6 each Topical Daily   Continuous Infusions:  sodium  chloride     amiodarone 30 mg/hr (10/28/22 1000)   cefTRIAXone (ROCEPHIN)  IV Stopped (10/27/22 1902)   furosemide Stopped (10/28/22 4403)   heparin 1,900 Units/hr (10/28/22 1000)   milrinone 0.25 mcg/kg/min (10/28/22 1001)   norepinephrine (LEVOPHED) Adult infusion     PRN Meds: ondansetron (ZOFRAN) IV, mouth rinse, witch hazel-glycerin  Allergies:    Allergies  Allergen Reactions   Bee Venom Anaphylaxis   Amiodarone Nausea Only   Spironolactone     Social History:   Social History   Socioeconomic History   Marital status: Married    Spouse name: Not on file   Number of children: 3   Years of education: Not on file   Highest education level: Not on file  Occupational History   Occupation: realtor  Tobacco Use   Smoking status: Never   Smokeless tobacco: Never  Vaping Use   Vaping status: Never Used  Substance and Sexual Activity   Alcohol use: Yes    Comment: occasional   Drug use: Never   Sexual activity: Not on file  Other Topics Concern   Not on file  Social History Narrative   Not on file   Social Determinants of Health   Financial Resource Strain: Not on file  Food Insecurity: No Food Insecurity (10/15/2022)   Hunger Vital Sign    Worried About Running Out of Food in the Last Year: Never true    Ran Out of Food in the Last Year: Never true  Transportation Needs: No Transportation Needs (10/15/2022)   PRAPARE - Administrator, Civil Service (Medical): No    Lack of Transportation (Non-Medical): No  Physical Activity: Not on file  Stress: Not on file  Social Connections: Not on file  Intimate Partner Violence: Not At Risk (10/18/2022)   Humiliation, Afraid, Rape, and Kick questionnaire    Fear of Current or Ex-Partner: No    Emotionally Abused: No    Physically Abused: No    Sexually Abused: No    Family History:   Family History  Problem Relation Age of Onset   Breast cancer Mother    Cancer Father        blood cancer   Hypertension  Other    Heart disease Other    Stomach cancer Neg Hx    Colon cancer Neg Hx    Esophageal cancer Neg Hx    Pancreatic cancer Neg Hx    Rectal cancer Neg Hx      ROS:  Please see the history of present illness.  All other ROS reviewed and negative.     Physical Exam/Data:   Vitals:   10/28/22 0900 10/28/22 1000 10/28/22 1100 10/28/22 1152  BP: 98/68     Pulse: 92 99 (!) 122   Resp: 19 (!) 22 (!) 32   Temp:    98 F (36.7 C)  TempSrc:    Oral  SpO2: 99% 100% 98%   Weight:        Intake/Output Summary (Last 24 hours) at 10/28/2022 1236 Last data filed at 10/28/2022 1100 Gross per 24 hour  Intake 1737.35 ml  Output 2510 ml  Net -772.65 ml      10/28/2022    5:00 AM 10/24/2022    4:05 AM 10/23/2022    5:26 AM  Last 3 Weights  Weight (lbs) 328 lb 7.8 oz 314 lb 4.8 oz 312 lb 9.6 oz  Weight (kg) 149 kg 142.566 kg 141.794 kg     Body mass index is 38.95 kg/m.  General:  Well nourished, well developed, in no acute distress HEENT: normal Neck: no JVD Vascular: No carotid bruits Cardiac:  RRR; soft SM, no rubs Lungs:  diminished at the bases b/l, no wheezing, rhonchi or rales  Abd: soft, nontender Ext: no edema Musculoskeletal:  No deformities Skin: warm and dry  Neuro:  no  focal abnormalities noted Psych:  Normal affect   EKG:  The EKG was personally reviewed and demonstrates:    #1 WCT 136bpm, is irregular, LAD, no R progression, QRS #2 is WCT 132, is regular, same morphology, QRS ,  #3 is irregular 113bpm, same morphology, QRS more narrow   OLD 10/10/22: AFlutter looks atypical, 135bpm, QRS ,  10/14/22 SR 99bpm, 1st degree AVblock , LAD, poor R progression, QRS 05/27/21: SR 96bpm, LAD QRS   Telemetry:  Telemetry was personally reviewed and demonstrates:    AFlutter 120's this AM in/out >> currently SR 90's  Relevant CV Studies:   Echo 10/15/22 1. Left ventricular ejection fraction, by estimation, is 25 to 30%. The  left  ventricle has severely decreased function. The left ventricle  demonstrates global hypokinesis. The left ventricular internal cavity size  was severely dilated. Left ventricular  diastolic function could not be evaluated.   2. Right ventricular systolic function is moderately reduced. The right  ventricular size is moderately enlarged. Tricuspid regurgitation signal is  inadequate for assessing PA pressure.   3. The mitral valve is normal in structure. Trivial mitral valve  regurgitation. No evidence of mitral stenosis.   4. The aortic valve is normal in structure. Aortic valve regurgitation is  not visualized. No aortic stenosis is present.   5. The inferior vena cava is normal in size with <50% respiratory  variability, suggesting right atrial pressure of 8 mmHg.   6. Ascending aorta and aortic root measurements are within normal limits  for age when indexed to body surface area.   7. Right atrial size was mildly dilated.   8. Left atrial size was moderately dilated.   Laboratory Data:  High Sensitivity Troponin:   Recent Labs  Lab 10/27/22 1048 10/27/22 1242  TROPONINIHS 35* 34*     Chemistry Recent Labs  Lab 10/24/22 0609 10/27/22 1042 10/27/22 1534 10/27/22 1955 10/28/22 0440  NA 131* 132* 136 133* 130*  K 3.7 6.0* 5.7* 5.6* 5.0  CL 94* 100 102 97* 98  CO2 26 19* 15* 20* 20*  GLUCOSE 112* 164* 114* 165* 130*  BUN 42* 42* 40* 40* 42*  CREATININE 1.92* 2.35* 2.23* 2.36* 2.25*  CALCIUM 8.5* 9.2 9.3 8.9 8.2*  MG 2.3 2.5*  --   --  2.3  GFRNONAA 40* 31* 33* 31* 33*  ANIONGAP 11 13 19* 16* 12    Recent Labs  Lab 10/27/22 1042  PROT 6.7  ALBUMIN 3.8  AST 152*  ALT 114*  ALKPHOS 91  BILITOT 4.8*   Lipids No results for input(s): "CHOL", "TRIG", "HDL", "LABVLDL", "LDLCALC", "CHOLHDL" in the last 168 hours.  Hematology Recent Labs  Lab 10/24/22 0609 10/27/22 1042 10/28/22 0440  WBC 12.4* 14.2* 21.3*  RBC 6.29* 6.63* 5.79  5.81  HGB 17.7* 19.4* 17.0  HCT  52.8* 58.0* 50.0  MCV 83.9 87.5 86.4  MCH 28.1 29.3 29.4  MCHC 33.5 33.4 34.0  RDW 16.2* 17.9* 16.6*  PLT 181 214 201   Thyroid  Recent Labs  Lab 10/27/22 1049 10/28/22 0440  TSH 8.889*  --   FREET4  --  1.22*    BNP Recent Labs  Lab 10/27/22 1049  BNP 1,723.3*    DDimer No results for input(s): "DDIMER" in the last 168 hours.   Radiology/Studies:  DG CHEST PORT 1 VIEW Result Date: 10/27/2022 CLINICAL DATA:  Central line placement EXAM: PORTABLE CHEST 1 VIEW COMPARISON:  10/27/2022 FINDINGS: Right central line  is been placed with the tip in the upper right atrium. No pneumothorax. Cardiomegaly. Bilateral airspace disease worsening since prior study. This is most pronounced in the right upper lobe. No effusions or acute bony abnormality. IMPRESSION: Right central line tip in the upper right atrium.  No pneumothorax. Worsening bilateral airspace disease, most pronounced in the right upper lobe. This is concerning for infection or edema. Electronically Signed   By: Charlett Nose M.D.   On: 10/27/2022 19:06   DG Chest Portable 1 View Result Date: 10/27/2022 CLINICAL DATA:  SOB, arrythnmia, feels fluid overloaded EXAM: PORTABLE CHEST 1 VIEW COMPARISON:  CXR 10/14/22 FINDINGS: Cardiomegaly. No pleural effusion. No pneumothorax. There is a focal airspace opacity in the right mid lung field that is suspicious for infection. No radiographically apparent displaced rib fracture. Visualized upper abdomen is unremarkable. IMPRESSION: 1. Focal airspace opacity in the right mid lung field is suspicious for infection. 2.  Cardiomegaly. Electronically Signed   By: Lorenza Cambridge M.D.   On: 10/27/2022 13:31     Assessment and Plan:   Persistent AFib AFlutter (looks atypical) PVCs NICM  SR currently, continue amiodarone Initial EKGs much different then his baseline, much borader QRS, ? If may have been VT, though some irregular Repeat another now in SR ? If any role for another limited echo  PO  amiodarone poorly tolerated, using anti nausea meds regularly he estimates about q3hours in the hospital prior to discharge, nearly immediately at home started to feel terrible overall   EP MD will see, review rhythms, EP procedural options and timeline   Risk Assessment/Risk Scores:     For questions or updates, please contact [facility] Please consult [email or web address] for contact info under    Signed, Sheilah Pigeon, PA-C  10/28/2022 12:36 PM

## 2022-10-28 NOTE — Progress Notes (Signed)
Pt doesn't want to wear CPAP for the night. 

## 2022-10-28 NOTE — Progress Notes (Addendum)
Advanced Heart Failure Rounding Note  PCP-Cardiologist: Armanda Magic, MD   Subjective:    On Amio gtt at 30 + Milrinone 0.25. Co-ox 79%.  Remains in rapid AF, 120s.   On ceftriaxone for possible PNA. WBC 14>>21K. Afebrile. PCT 0.17   Lactic acid 2.8>>3.1>>pending  Scr 2.23>>2.25. K 5.0. 1.9L in UOP yesterday w/ IV Lasix. CVP 10 today. Feels better today, breathing improving but still on 5L [place]. Reports productive cough w/ green colored sputum.   Iron studies c/w IDA.   Objective:   Weight Range: (!) 149 kg Body mass index is 38.95 kg/m.   Vital Signs:   Temp:  [97.4 F (36.3 C)-98 F (36.7 C)] 97.8 F (36.6 C) (08/20 0311) Pulse Rate:  [58-155] 114 (08/20 0630) Resp:  [17-42] 22 (08/20 0630) BP: (59-164)/(40-148) 95/69 (08/20 0630) SpO2:  [84 %-100 %] 98 % (08/20 0630) Weight:  [149 kg] 149 kg (08/20 0500) Last BM Date : 10/27/22  Weight change: Filed Weights   10/28/22 0500  Weight: (!) 149 kg    Intake/Output:   Intake/Output Summary (Last 24 hours) at 10/28/2022 0719 Last data filed at 10/28/2022 0600 Gross per 24 hour  Intake 843.86 ml  Output 1990 ml  Net -1146.14 ml      Physical Exam    JVD 10 cm  General:  fatigued appearing. No resp difficulty HEENT: Normal Neck: Supple. JVP 10 cm. Rt internal jugular CVC Carotids 2+ bilat; no bruits. No lymphadenopathy or thyromegaly appreciated. Cor: PMI nondisplaced. Irregularly irregular rhythm. No rubs, gallops or murmurs. Lungs: decreased BS at the bases  Abdomen: Soft, nontender, nondistended. No hepatosplenomegaly. No bruits or masses. Good bowel sounds. Extremities: No cyanosis, clubbing, rash, trace b/l LE edema, cool distal ext  Neuro: Alert & orientedx3, cranial nerves grossly intact. moves all 4 extremities w/o difficulty. Affect pleasant   Telemetry   AFL 120s, personally reviewed   EKG    No new EKG to review   Labs    CBC Recent Labs    10/27/22 1042 10/28/22 0440  WBC 14.2*  21.3*  NEUTROABS 10.8*  --   HGB 19.4* 17.0  HCT 58.0* 50.0  MCV 87.5 86.4  PLT 214 201   Basic Metabolic Panel Recent Labs    62/95/28 1042 10/27/22 1534 10/27/22 1955 10/28/22 0440  NA 132*   < > 133* 130*  K 6.0*   < > 5.6* 5.0  CL 100   < > 97* 98  CO2 19*   < > 20* 20*  GLUCOSE 164*   < > 165* 130*  BUN 42*   < > 40* 42*  CREATININE 2.35*   < > 2.36* 2.25*  CALCIUM 9.2   < > 8.9 8.2*  MG 2.5*  --   --  2.3   < > = values in this interval not displayed.   Liver Function Tests Recent Labs    10/27/22 1042  AST 152*  ALT 114*  ALKPHOS 91  BILITOT 4.8*  PROT 6.7  ALBUMIN 3.8   Recent Labs    10/27/22 1042  LIPASE 14   Cardiac Enzymes No results for input(s): "CKTOTAL", "CKMB", "CKMBINDEX", "TROPONINI" in the last 72 hours.  BNP: BNP (last 3 results) Recent Labs    10/01/22 1020 10/15/22 0126 10/27/22 1049  BNP 822.7* 777.2* 1,723.3*    ProBNP (last 3 results) No results for input(s): "PROBNP" in the last 8760 hours.   D-Dimer No results for input(s): "DDIMER" in the  last 72 hours. Hemoglobin A1C No results for input(s): "HGBA1C" in the last 72 hours. Fasting Lipid Panel No results for input(s): "CHOL", "HDL", "LDLCALC", "TRIG", "CHOLHDL", "LDLDIRECT" in the last 72 hours. Thyroid Function Tests Recent Labs    10/27/22 1049  TSH 8.889*    Other results:   Imaging    DG CHEST PORT 1 VIEW  Result Date: 10/27/2022 CLINICAL DATA:  Central line placement EXAM: PORTABLE CHEST 1 VIEW COMPARISON:  10/27/2022 FINDINGS: Right central line is been placed with the tip in the upper right atrium. No pneumothorax. Cardiomegaly. Bilateral airspace disease worsening since prior study. This is most pronounced in the right upper lobe. No effusions or acute bony abnormality. IMPRESSION: Right central line tip in the upper right atrium.  No pneumothorax. Worsening bilateral airspace disease, most pronounced in the right upper lobe. This is concerning for  infection or edema. Electronically Signed   By: Charlett Nose M.D.   On: 10/27/2022 19:06   DG Chest Portable 1 View  Result Date: 10/27/2022 CLINICAL DATA:  SOB, arrythnmia, feels fluid overloaded EXAM: PORTABLE CHEST 1 VIEW COMPARISON:  CXR 10/14/22 FINDINGS: Cardiomegaly. No pleural effusion. No pneumothorax. There is a focal airspace opacity in the right mid lung field that is suspicious for infection. No radiographically apparent displaced rib fracture. Visualized upper abdomen is unremarkable. IMPRESSION: 1. Focal airspace opacity in the right mid lung field is suspicious for infection. 2.  Cardiomegaly. Electronically Signed   By: Lorenza Cambridge M.D.   On: 10/27/2022 13:31     Medications:     Scheduled Medications:  adenosine (ADENOCARD) IV  6 mg Intravenous Once   Chlorhexidine Gluconate Cloth  6 each Topical Daily    Infusions:  sodium chloride     amiodarone 30 mg/hr (10/28/22 0602)   cefTRIAXone (ROCEPHIN)  IV Stopped (10/27/22 1902)   furosemide Stopped (10/27/22 2135)   heparin 1,900 Units/hr (10/28/22 0533)   milrinone 0.25 mcg/kg/min (10/28/22 0500)   norepinephrine (LEVOPHED) Adult infusion      PRN Medications: ondansetron (ZOFRAN) IV, mouth rinse, witch hazel-glycerin    Patient Profile   57 y/o male w/ chronic systolic heart failure due to NICM, 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   Assessment/Plan   1. Acute on Chronic Systolic Heart Failure w/ Low-Output  - Echo 2011: EF 30-35%. Cath 2011 with normal cors. - Echo 10/17: EF 35-40%. - CMRI 2017 LVEF 20%. No LGE - Echo 12/21 EF 40-45%. Coronary CTA normal cors, calcium score 0   - Cardiac PET 2023- no evidence for cardiac sarcoid or inflammatory. LVEF 38%.   -  recent admit 8/24, developed CGS post DCCV. Echo EF 20-25% and RV moderately reduced - now readmitted w/ ADHF in setting of recurrent AFL w/ RVR. NYHA Class IIIB w/ low output   - on Milrinone 0.25. Co-ox 79%. C/w AKI but diuresing w/ Lasix, CVP 10 today. SCr stable  - continue milrinone at current rate - repeat IV Lasix 120 mg x 1 today and follow response   - repeat LA and follow for clearance  - continue amio gtt for rate/rhythm control  - GDMT limited by AKI on CKD    2. Recurrent AFL w/ RVR  - failed multiple DCCVs as outlined above. Failing PO  amiodarone  - start amio gtt - heparin gtt while in ICU - TSH elevated (8.9). Free T4 1.22. Free T3 pending  - CPAP at bedtime  - maintenance of NSR imperative. He does not tolerate AFL well. Multiple readmits for ADHF and shock. - will ask EP to see for possible AFL ablation vs AVN ablation w/ PPM    3. AKI on CKD IIIB - likely cardiorenal, nonoliguric   - Recent Scr 1.8 but previously creatinine baseline ~ 1.2.  - Recent admit, SCr bumped to 2.2>>down to 1.9 at d/c  - SCr 2.35 on this admit, suspect low-output - SCr stable today at 2.2. Continue milrinone for inotropic support  - avoid hypotension and follow BMP    4. Hyperkalemia - in setting of AKI  - initial K in ER 6.0, treated w/ albuterol, calcium gluconate, and insulin + Lokelma x 2 - K 5.0 today  - monitor on tele - zoll pads in place     5. OSA  - Continue CPAP nightly.   6. ? PNA -  CXR w/ focal airspace opacity in the right mid lung field, new since prior exam. WBC 14>>21K - PCT 0.17  - covering w/ ceftriaxone   7. IDA - Fe 22, T sat 8 - will need IV Fe. Will wait until infection resolves    Length of Stay: 1  Sherion Dooly, PA-C  10/28/2022, 7:19 AM  Advanced Heart Failure Team Pager [phone] (M-F; 7a - 5p)  Please contact [facility] for night-coverage after hours (5p -7a ) and weekends on [email or web address]

## 2022-10-28 NOTE — Progress Notes (Signed)
Attempted Echocardiogram, RN Korey request to come back later to do the exam.

## 2022-10-28 NOTE — Progress Notes (Signed)
Echocardiogram 2D Echocardiogram has been performed.  Clenton Esper N Lorenna Lurry,RDCS 10/28/2022, 3:11 PM

## 2022-10-28 NOTE — Progress Notes (Signed)
ANTICOAGULATION CONSULT NOTE  Pharmacy Consult for IV Heparin Indication: atrial fibrillation  Allergies  Allergen Reactions   Bee Venom Anaphylaxis   Amiodarone Nausea Only   Spironolactone     Patient Measurements: Weight: (!) 149 kg (328 lb 7.8 oz) Heparin Dosing Weight: ~ 120 kg  Vital Signs: Temp: 98.1 F (36.7 C) (08/20 1621) Temp Source: Oral (08/20 1621) BP: 102/85 (08/20 1800) Pulse Rate: 100 (08/20 1800)  Labs: Recent Labs    10/27/22 1042 10/27/22 1048 10/27/22 1242 10/27/22 1534 10/27/22 1955 10/28/22 0007 10/28/22 0440 10/28/22 0828 10/28/22 1722  HGB 19.4*  --   --   --   --   --  17.0  --   --   HCT 58.0*  --   --   --   --   --  50.0  --   --   PLT 214  --   --   --   --   --  201  --   --   APTT  --   --   --   --   --  46*  --  82* 93*  HEPARINUNFRC  --   --   --   --   --  >1.10*  --   --   --   CREATININE 2.35*  --   --  2.23* 2.36*  --  2.25*  --   --   TROPONINIHS  --  35* 34*  --   --   --   --   --   --     Estimated Creatinine Clearance: 57.2 mL/min (A) (by C-G formula based on SCr of 2.25 mg/dL (H)).  Assessment: 57 yo male on chronic Eliquis for afib.  Pharmacy asked to start IV heparin 8/19 while Eliquis is on hold in case of procedures.  APTT is within goal range at 93 seconds and trending up.  No overt bleeding or complications noted.  CBC stable.  Goal of Therapy:  Heparin level 0.3-0.7 units/ml APTT 66-102 sec Monitor platelets by anticoagulation protocol: Yes   Plan:  Reduce IV heparin to 1800 units/hr Daily aPTT, heparin level and CBC.  Allisen Pidgeon D. Laney Potash, PharmD, BCPS, BCCCP 10/28/2022, 7:01 PM

## 2022-10-29 DIAGNOSIS — E875 Hyperkalemia: Secondary | ICD-10-CM | POA: Diagnosis not present

## 2022-10-29 DIAGNOSIS — I5023 Acute on chronic systolic (congestive) heart failure: Secondary | ICD-10-CM | POA: Diagnosis not present

## 2022-10-29 LAB — BASIC METABOLIC PANEL
Anion gap: 14 (ref 5–15)
Anion gap: 10 (ref 5–15)
BUN: 36 mg/dL — ABNORMAL HIGH (ref 6–20)
BUN: 36 mg/dL — ABNORMAL HIGH (ref 6–20)
CO2: 22 mmol/L (ref 22–32)
CO2: 23 mmol/L (ref 22–32)
Calcium: 8.2 mg/dL — ABNORMAL LOW (ref 8.9–10.3)
Calcium: 7.9 mg/dL — ABNORMAL LOW (ref 8.9–10.3)
Chloride: 95 mmol/L — ABNORMAL LOW (ref 98–111)
Chloride: 97 mmol/L — ABNORMAL LOW (ref 98–111)
Creatinine, Ser: 2.14 mg/dL — ABNORMAL HIGH (ref 0.61–1.24)
Creatinine, Ser: 2.14 mg/dL — ABNORMAL HIGH (ref 0.61–1.24)
GFR, Estimated: 35 mL/min — ABNORMAL LOW (ref >60)
GFR, Estimated: 35 mL/min — ABNORMAL LOW (ref >60)
Glucose, Bld: 138 mg/dL — ABNORMAL HIGH (ref 70–99)
Glucose, Bld: 133 mg/dL — ABNORMAL HIGH (ref 70–99)
Potassium: 4 mmol/L (ref 3.5–5.1)
Potassium: 4 mmol/L (ref 3.5–5.1)
Sodium: 131 mmol/L — ABNORMAL LOW (ref 135–145)
Sodium: 130 mmol/L — ABNORMAL LOW (ref 135–145)

## 2022-10-29 LAB — CBC
HCT: 48.3 % (ref 39.0–52.0)
Hemoglobin: 16.1 g/dL (ref 13.0–17.0)
MCH: 28.7 pg (ref 26.0–34.0)
MCHC: 33.3 g/dL (ref 30.0–36.0)
MCV: 86.1 fL (ref 80.0–100.0)
Platelets: 205 10*3/uL (ref 150–400)
RBC: 5.61 MIL/uL (ref 4.22–5.81)
RDW: 16.3 % — ABNORMAL HIGH (ref 11.5–15.5)
WBC: 16.1 10*3/uL — ABNORMAL HIGH (ref 4.0–10.5)
nRBC: 0.1 % (ref 0.0–0.2)

## 2022-10-29 LAB — MAGNESIUM: Magnesium: 2.2 mg/dL (ref 1.7–2.4)

## 2022-10-29 LAB — COOXEMETRY PANEL
Carboxyhemoglobin: 1.6 % — ABNORMAL HIGH (ref 0.5–1.5)
Carboxyhemoglobin: 1.3 % (ref 0.5–1.5)
Carboxyhemoglobin: 1.2 % (ref 0.5–1.5)
Methemoglobin: 1 % (ref 0.0–1.5)
Methemoglobin: <0.7 % (ref 0.0–1.5)
Methemoglobin: <0.7 % (ref 0.0–1.5)
O2 Saturation: 59 %
O2 Saturation: 58.9 %
O2 Saturation: 55.5 %
Total hemoglobin: 16.5 g/dL — ABNORMAL HIGH (ref 12.0–16.0)
Total hemoglobin: 16.6 g/dL — ABNORMAL HIGH (ref 12.0–16.0)
Total hemoglobin: 16.9 g/dL — ABNORMAL HIGH (ref 12.0–16.0)

## 2022-10-29 LAB — T3, FREE: T3, Free: 1.5 pg/mL — ABNORMAL LOW (ref 2.0–4.4)

## 2022-10-29 LAB — CG4 I-STAT (LACTIC ACID): Lactic Acid, Venous: 1.1 mmol/L (ref 0.5–1.9)

## 2022-10-29 LAB — HEPARIN LEVEL (UNFRACTIONATED): Heparin Unfractionated: 0.97 [IU]/mL — ABNORMAL HIGH (ref 0.30–0.70)

## 2022-10-29 LAB — APTT: aPTT: 62 seconds — ABNORMAL HIGH (ref 24–36)

## 2022-10-29 MED ORDER — ACETAZOLAMIDE 250 MG PO TABS
250.0000 mg | ORAL_TABLET | Freq: Two times a day (BID) | ORAL | Status: AC
  Administered 2022-10-29 (×2): 250 mg via ORAL
  Filled 2022-10-29 (×2): qty 1

## 2022-10-29 MED ORDER — MILRINONE LACTATE IN DEXTROSE 20-5 MG/100ML-% IV SOLN
0.1250 ug/kg/min | INTRAVENOUS | Status: DC
  Administered 2022-10-29 (×2): 0.375 ug/kg/min via INTRAVENOUS
  Administered 2022-10-30 (×2): 0.5 ug/kg/min via INTRAVENOUS
  Administered 2022-10-30: 0.375 ug/kg/min via INTRAVENOUS
  Administered 2022-10-30: 0.5 ug/kg/min via INTRAVENOUS
  Administered 2022-10-30: 0.375 ug/kg/min via INTRAVENOUS
  Administered 2022-10-31 – 2022-11-02 (×15): 0.5 ug/kg/min via INTRAVENOUS
  Administered 2022-11-03 (×3): 0.375 ug/kg/min via INTRAVENOUS
  Administered 2022-11-03: 0.5 ug/kg/min via INTRAVENOUS
  Administered 2022-11-04 (×2): 0.25 ug/kg/min via INTRAVENOUS
  Administered 2022-11-04: 0.375 ug/kg/min via INTRAVENOUS
  Administered 2022-11-05: 0.125 ug/kg/min via INTRAVENOUS
  Administered 2022-11-05 (×2): 0.25 ug/kg/min via INTRAVENOUS
  Filled 2022-10-29 (×32): qty 100

## 2022-10-29 NOTE — Progress Notes (Signed)
Was notified by RN regarding drop in UOP this shift despite high dose IV Lasix.   Co-ox repeated, 58%. CVP trending up, now 15-17. Pt more symptomatic w/ slight increase in resting dyspnea. Remains in NSR HR 90s. Amio gtt at 60.   D/w Dr. Gasper Lloyd. Will increase milrinone to 0.375 to help augment diuresis. Send repeat BMP. Apply TED hoses. RN updated at bedside.   Robbie Lis, PA-C

## 2022-10-29 NOTE — Plan of Care (Signed)
  Problem: Education: Goal: Knowledge of General Education information will improve Description: Including pain rating scale, medication(s)/side effects and non-pharmacologic comfort measures 10/29/2022 0705 by Arnetha Gula, RN Outcome: Progressing 10/29/2022 0704 by Arnetha Gula, RN Outcome: Progressing 10/29/2022 0704 by Arnetha Gula, RN Outcome: Progressing 10/29/2022 0704 by Arnetha Gula, RN Outcome: Progressing   Problem: Health Behavior/Discharge Planning: Goal: Ability to manage health-related needs will improve 10/29/2022 0705 by Arnetha Gula, RN Outcome: Progressing 10/29/2022 0704 by Arnetha Gula, RN Outcome: Progressing 10/29/2022 0704 by Arnetha Gula, RN Outcome: Progressing 10/29/2022 0704 by Arnetha Gula, RN Outcome: Progressing   Problem: Clinical Measurements: Goal: Ability to maintain clinical measurements within normal limits will improve 10/29/2022 0705 by Arnetha Gula, RN Outcome: Progressing 10/29/2022 0704 by Arnetha Gula, RN Outcome: Progressing 10/29/2022 0704 by Arnetha Gula, RN Outcome: Progressing 10/29/2022 0704 by Arnetha Gula, RN Outcome: Progressing Goal: Will remain free from infection 10/29/2022 0705 by Arnetha Gula, RN Outcome: Progressing 10/29/2022 0704 by Arnetha Gula, RN Outcome: Progressing 10/29/2022 0704 by Arnetha Gula, RN Outcome: Progressing 10/29/2022 0704 by Arnetha Gula, RN Outcome: Progressing Goal: Diagnostic test results will improve 10/29/2022 0705 by Arnetha Gula, RN Outcome: Progressing 10/29/2022 0704 by Arnetha Gula, RN Outcome: Progressing 10/29/2022 0704 by Arnetha Gula, RN Outcome: Progressing 10/29/2022 0704 by Arnetha Gula, RN Outcome: Progressing Goal: Respiratory complications will improve 10/29/2022 0705 by Arnetha Gula, RN Outcome: Progressing 10/29/2022 0704 by Arnetha Gula, RN Outcome: Progressing 10/29/2022 0704 by Arnetha Gula, RN Outcome: Progressing 10/29/2022 0704 by Arnetha Gula, RN Outcome: Progressing Goal:  Cardiovascular complication will be avoided 10/29/2022 0705 by Arnetha Gula, RN Outcome: Progressing 10/29/2022 0704 by Arnetha Gula, RN Outcome: Progressing 10/29/2022 0704 by Arnetha Gula, RN Outcome: Progressing 10/29/2022 0704 by Arnetha Gula, RN Outcome: Progressing

## 2022-10-29 NOTE — Progress Notes (Signed)
Rounding Note    Patient Name: Harold Park Date of Encounter: 10/29/2022  [facility] Cardiologist: Armanda Magic, MD   Subjective   Less SOB, though not completely resolved, still feels bloated  Inpatient Medications    Scheduled Meds:  acetaZOLAMIDE  250 mg Oral BID AC   Chlorhexidine Gluconate Cloth  6 each Topical Daily   Continuous Infusions:  sodium chloride     amiodarone 60 mg/hr (10/29/22 0913)   cefTRIAXone (ROCEPHIN)  IV Stopped (10/28/22 1917)   furosemide 62 mL/hr at 10/29/22 0800   heparin 1,850 Units/hr (10/29/22 0918)   milrinone 0.25 mcg/kg/min (10/29/22 0800)   norepinephrine (LEVOPHED) Adult infusion     PRN Meds: acetaminophen, ondansetron (ZOFRAN) IV, mouth rinse, witch hazel-glycerin   Vital Signs    Vitals:   10/29/22 0630 10/29/22 0700 10/29/22 0800 10/29/22 0813  BP:   98/71   Pulse: 86 86 91   Resp: (!) 26 18 19    Temp:    97.7 F (36.5 C)  TempSrc:    Oral  SpO2: 97% 96% 98%   Weight:        Intake/Output Summary (Last 24 hours) at 10/29/2022 0932 Last data filed at 10/29/2022 0800 Gross per 24 hour  Intake 2188.87 ml  Output 2945 ml  Net -756.13 ml      10/29/2022    5:00 AM 10/28/2022    5:00 AM 10/24/2022    4:05 AM  Last 3 Weights  Weight (lbs) 324 lb 8.3 oz 328 lb 7.8 oz 314 lb 4.8 oz  Weight (kg) 147.2 kg 149 kg 142.566 kg      Telemetry    SR currently had some AFlutter last night, PVCs are occassional  - Personally Reviewed  ECG    SR 100bpm, 1st degree AVblock , QRS better at (160's on admission) - Personally Reviewed  Physical Exam   GEN: No acute distress.   Neck: +JVD Cardiac: RRR, no murmurs, rubs, or gallops.  Respiratory: Clear to auscultation bilaterally, ant/lat auscultation GI: Soft, nontender, non-distended  MS: No edema; No deformity. Neuro:  Nonfocal  Psych: Normal affect   Labs    High Sensitivity Troponin:   Recent Labs  Lab 10/27/22 1048 10/27/22 1242   TROPONINIHS 35* 34*     Chemistry Recent Labs  Lab 10/27/22 1042 10/27/22 1534 10/27/22 1955 10/28/22 0440 10/29/22 0305  NA 132*   < > 133* 130* 131*  K 6.0*   < > 5.6* 5.0 4.0  CL 100   < > 97* 98 95*  CO2 19*   < > 20* 20* 22  GLUCOSE 164*   < > 165* 130* 138*  BUN 42*   < > 40* 42* 36*  CREATININE 2.35*   < > 2.36* 2.25* 2.14*  CALCIUM 9.2   < > 8.9 8.2* 8.2*  MG 2.5*  --   --  2.3 2.2  PROT 6.7  --   --   --   --   ALBUMIN 3.8  --   --   --   --   AST 152*  --   --   --   --   ALT 114*  --   --   --   --   ALKPHOS 91  --   --   --   --   BILITOT 4.8*  --   --   --   --   GFRNONAA 31*   < > 31* 33* 35*  ANIONGAP 13   < > 16* 12 14   < > = values in this interval not displayed.    Lipids No results for input(s): "CHOL", "TRIG", "HDL", "LABVLDL", "LDLCALC", "CHOLHDL" in the last 168 hours.  Hematology Recent Labs  Lab 10/27/22 1042 10/28/22 0440 10/29/22 0305  WBC 14.2* 21.3* 16.1*  RBC 6.63* 5.79  5.81 5.61  HGB 19.4* 17.0 16.1  HCT 58.0* 50.0 48.3  MCV 87.5 86.4 86.1  MCH 29.3 29.4 28.7  MCHC 33.4 34.0 33.3  RDW 17.9* 16.6* 16.3*  PLT 214 201 205   Thyroid  Recent Labs  Lab 10/27/22 1049 10/28/22 0440  TSH 8.889*  --   FREET4  --  1.22*    BNP Recent Labs  Lab 10/27/22 1049  BNP 1,723.3*    DDimer No results for input(s): "DDIMER" in the last 168 hours.   Radiology      Cardiac Studies   10/28/22: TTE 1. Left ventricular ejection fraction, by estimation, is 25 to 30%. The  left ventricle has severely decreased function. The left ventricle  demonstrates global hypokinesis. The left ventricular internal cavity size  was severely dilated. Left ventricular  diastolic function could not be evaluated.   2. Right ventricular systolic function is moderately reduced. The right  ventricular size is normal. Tricuspid regurgitation signal is inadequate  for assessing PA pressure.   3. Left atrial size was severely dilated.   4. Right atrial size  was mild to moderately dilated.   5. The mitral valve is degenerative. Mild mitral valve regurgitation. No  evidence of mitral stenosis.   6. The aortic valve is normal in structure. Aortic valve regurgitation is  not visualized. No aortic stenosis is present.   7. The inferior vena cava is dilated in size with <50% respiratory  variability, suggesting right atrial pressure of 15 mmHg.    Echo 10/15/22 1. Left ventricular ejection fraction, by estimation, is 25 to 30%. The  left ventricle has severely decreased function. The left ventricle  demonstrates global hypokinesis. The left ventricular internal cavity size  was severely dilated. Left ventricular  diastolic function could not be evaluated.   2. Right ventricular systolic function is moderately reduced. The right  ventricular size is moderately enlarged. Tricuspid regurgitation signal is  inadequate for assessing PA pressure.   3. The mitral valve is normal in structure. Trivial mitral valve  regurgitation. No evidence of mitral stenosis.   4. The aortic valve is normal in structure. Aortic valve regurgitation is  not visualized. No aortic stenosis is present.   5. The inferior vena cava is normal in size with <50% respiratory  variability, suggesting right atrial pressure of 8 mmHg.   6. Ascending aorta and aortic root measurements are within normal limits  for age when indexed to body surface area.   7. Right atrial size was mildly dilated.   8. Left atrial size was moderately dilated.   Patient Profile     57 y.o. male NICM, PVCs (remotely by monitor w/burden of 6.3% in 2017), OSA w/CPAP, pulmonary sarcoid, p.HTN, HTN, AFib/aflutter admitted with Acute/chronic CHF and AFlutter w/RVR   Assessment & Plan    Persistent AFib AFlutter (typical and atypical) CHA2DS2Vasc is 2, on Eliquis outpt >>> heprain here PVCs  PO amiodarone very poorly tolerated Doing OK on IV amiodarone, holding SR so far  In Dr. Morrie Sheldon review of  his echos, with severely dilated aria, likely hood of maintaining SR is low/unlikely Tentative working plan right  now would be to pursue AVNode ablation and likely a CRT-D implant once clinically ready from a HF perspective  In d/w Dr. Sofie Hartigan, perhaps ready by Friday, will look to maybe RHC tomorrow given his pulm sarcoid  (Neg PET for cardiac)  Will try to manage PVCs via BB (when able to tolerate), alternative to amiodarone post device Unless he could tolerate very low dose amio, PVC burden currently appears low   NICM Remains volume OL but feeling better   For questions or updates, please contact [facility] Please consult [email or web address] for contact info under        Signed, Sheilah Pigeon, PA-C  10/29/2022, 9:32 AM

## 2022-10-29 NOTE — Progress Notes (Addendum)
Advanced Heart Failure Rounding Note  PCP-Cardiologist: Armanda Magic, MD   Subjective:    Back in NSR. On Amio gtt at 30/hr.   Milrinone 0.25. Co-ox 59%.    3L in UOP yesterday but only net neg 710 cc. CVP 14-15   Scr 2.36>>2.25>>2.14 K 4.0  On ceftriaxone for possible PNA. WBC 21>>16K.   Lactic acid 2.8>>3.1>>1.9   Echo 8/20: EF 25-30%, RV mod reduced. IVC dilated estimated RAP ~15   Feeling better today. Breathing improved. No resting dyspnea. Denies CP.    Objective:   Weight Range: (!) 147.2 kg Body mass index is 38.48 kg/m.   Vital Signs:   Temp:  [98 F (36.7 C)-98.3 F (36.8 C)] 98 F (36.7 C) (08/21 0332) Pulse Rate:  [86-191] 86 (08/21 0700) Resp:  [16-32] 18 (08/21 0700) BP: (84-135)/(59-118) 94/59 (08/21 0500) SpO2:  [94 %-100 %] 96 % (08/21 0700) Weight:  [147.2 kg] 147.2 kg (08/21 0500) Last BM Date : 10/27/22  Weight change: Filed Weights   10/28/22 0500 10/29/22 0500  Weight: (!) 149 kg (!) 147.2 kg    Intake/Output:   Intake/Output Summary (Last 24 hours) at 10/29/2022 0759 Last data filed at 10/29/2022 0700 Gross per 24 hour  Intake 2319.21 ml  Output 3030 ml  Net -710.79 ml      Physical Exam    CVP 14-15 General:  Well appearing. No respiratory difficulty HEENT: normal Neck: supple. Thick neck JVD 14 cm. + Rt internal jugular CVC. Carotids 2+ bilat; no bruits. No lymphadenopathy or thyromegaly appreciated. Cor: PMI nondisplaced. Regular rate & rhythm. No rubs, gallops or murmurs. Lungs: clear Abdomen: soft, nontender, nondistended. No hepatosplenomegaly. No bruits or masses. Good bowel sounds. Extremities: no cyanosis, clubbing, rash, trace b/l LE pretibial edema Neuro: alert & oriented x 3, cranial nerves grossly intact. moves all 4 extremities w/o difficulty. Affect pleasant.  Telemetry   NSR 90s w/ occasional PVCs  personally reviewed   EKG    No new EKG to review   Labs    CBC Recent Labs    10/27/22 1042  10/28/22 0440 10/29/22 0305  WBC 14.2* 21.3* 16.1*  NEUTROABS 10.8*  --   --   HGB 19.4* 17.0 16.1  HCT 58.0* 50.0 48.3  MCV 87.5 86.4 86.1  PLT 214 201 205   Basic Metabolic Panel Recent Labs    82/95/62 0440 10/29/22 0305  NA 130* 131*  K 5.0 4.0  CL 98 95*  CO2 20* 22  GLUCOSE 130* 138*  BUN 42* 36*  CREATININE 2.25* 2.14*  CALCIUM 8.2* 8.2*  MG 2.3 2.2   Liver Function Tests Recent Labs    10/27/22 1042  AST 152*  ALT 114*  ALKPHOS 91  BILITOT 4.8*  PROT 6.7  ALBUMIN 3.8   Recent Labs    10/27/22 1042  LIPASE 14   Cardiac Enzymes No results for input(s): "CKTOTAL", "CKMB", "CKMBINDEX", "TROPONINI" in the last 72 hours.  BNP: BNP (last 3 results) Recent Labs    10/01/22 1020 10/15/22 0126 10/27/22 1049  BNP 822.7* 777.2* 1,723.3*    ProBNP (last 3 results) No results for input(s): "PROBNP" in the last 8760 hours.   D-Dimer No results for input(s): "DDIMER" in the last 72 hours. Hemoglobin A1C No results for input(s): "HGBA1C" in the last 72 hours. Fasting Lipid Panel No results for input(s): "CHOL", "HDL", "LDLCALC", "TRIG", "CHOLHDL", "LDLDIRECT" in the last 72 hours. Thyroid Function Tests Recent Labs    10/27/22 1049  10/28/22 0440  TSH 8.889*  --   T3FREE  --  1.5*    Other results:   Imaging    ECHOCARDIOGRAM COMPLETE  Result Date: 10/28/2022    ECHOCARDIOGRAM REPORT   Patient Name:   Harold Park Date of Exam: 10/28/2022 Medical Rec #:  [id number]       Height:       77.0 in Accession #:    [id number]      Weight:       328.5 lb Date of Birth:  [birth year]       BSA:          2.762 m Patient Age:    57 years        BP:           122/117 mmHg Patient Gender: M               HR:           100 bpm. Exam Location:  Inpatient Procedure: 2D Echo, Color Doppler, Cardiac Doppler and Intracardiac            Opacification Agent Indications:    CHF-Acute Systolic  History:        Patient has prior history of Echocardiogram examinations,  most                 recent 10/15/2022. CHF; Risk Factors:Hypertension. Obstructive                 Sleep Apnea, Non-Ischemic Cardiomyopathy, Paroxysmal Atrial                 Fibrillation.  Sonographer:    Raeford Razor RDCS Referring Phys: 73 Roxy Horseman Meily Glowacki  Sonographer Comments: Technically difficult study due to poor echo windows and patient is obese. Image acquisition challenging due to patient body habitus. IMPRESSIONS  1. Left ventricular ejection fraction, by estimation, is 25 to 30%. The left ventricle has severely decreased function. The left ventricle demonstrates global hypokinesis. The left ventricular internal cavity size was severely dilated. Left ventricular diastolic function could not be evaluated.  2. Right ventricular systolic function is moderately reduced. The right ventricular size is normal. Tricuspid regurgitation signal is inadequate for assessing PA pressure.  3. Left atrial size was severely dilated.  4. Right atrial size was mild to moderately dilated.  5. The mitral valve is degenerative. Mild mitral valve regurgitation. No evidence of mitral stenosis.  6. The aortic valve is normal in structure. Aortic valve regurgitation is not visualized. No aortic stenosis is present.  7. The inferior vena cava is dilated in size with <50% respiratory variability, suggesting right atrial pressure of 15 mmHg. FINDINGS  Left Ventricle: Left ventricular ejection fraction, by estimation, is 25 to 30%. The left ventricle has severely decreased function. The left ventricle demonstrates global hypokinesis. Definity contrast agent was given IV to delineate the left ventricular endocardial borders. The left ventricular internal cavity size was severely dilated. There is no left ventricular hypertrophy. Left ventricular diastolic function could not be evaluated. Right Ventricle: The right ventricular size is normal. No increase in right ventricular wall thickness. Right ventricular systolic function is  moderately reduced. Tricuspid regurgitation signal is inadequate for assessing PA pressure. Left Atrium: Left atrial size was severely dilated. Right Atrium: Right atrial size was mild to moderately dilated. Pericardium: There is no evidence of pericardial effusion. Mitral Valve: The mitral valve is degenerative in appearance. There is moderate thickening of the mitral valve leaflet(s). Mild mitral valve  regurgitation. No evidence of mitral valve stenosis. Tricuspid Valve: The tricuspid valve is normal in structure. Tricuspid valve regurgitation is trivial. No evidence of tricuspid stenosis. Aortic Valve: The aortic valve is normal in structure. Aortic valve regurgitation is not visualized. No aortic stenosis is present. Aortic valve peak gradient measures 2.2 mmHg. Pulmonic Valve: The pulmonic valve was normal in structure. Pulmonic valve regurgitation is not visualized. No evidence of pulmonic stenosis. Aorta: The aortic root is normal in size and structure. Venous: The inferior vena cava is dilated in size with less than 50% respiratory variability, suggesting right atrial pressure of 15 mmHg. IAS/Shunts: No atrial level shunt detected by color flow Doppler.  LEFT VENTRICLE PLAX 2D LVIDd:         6.80 cm      Diastology LVIDs:         5.50 cm      LV e' medial:    8.81 cm/s LV PW:         1.10 cm      LV E/e' medial:  12.3 LV IVS:        1.10 cm      LV e' lateral:   11.90 cm/s LVOT diam:     2.40 cm      LV E/e' lateral: 9.1 LV SV:         38 LV SV Index:   14 LVOT Area:     4.52 cm  LV Volumes (MOD) LV vol d, MOD A2C: 291.0 ml LV vol d, MOD A4C: 291.0 ml LV vol s, MOD A2C: 217.0 ml LV vol s, MOD A4C: 207.0 ml LV SV MOD A2C:     74.0 ml LV SV MOD A4C:     291.0 ml LV SV MOD BP:      80.6 ml RIGHT VENTRICLE          IVC RV Basal diam:  3.80 cm  IVC diam: 2.80 cm RV Mid diam:    5.40 cm TAPSE (M-mode): 1.9 cm LEFT ATRIUM              Index        RIGHT ATRIUM           Index LA diam:        5.20 cm  1.88 cm/m    RA Area:     28.20 cm LA Vol (A2C):   222.0 ml 80.38 ml/m  RA Volume:   99.80 ml  36.13 ml/m LA Vol (A4C):   103.0 ml 37.29 ml/m LA Biplane Vol: 158.0 ml 57.21 ml/m  AORTIC VALVE AV Area (Vmax): 3.62 cm AV Vmax:        74.60 cm/s AV Peak Grad:   2.2 mmHg LVOT Vmax:      59.70 cm/s LVOT Vmean:     38.700 cm/s LVOT VTI:       0.083 m  AORTA Ao Root diam: 2.70 cm Ao Asc diam:  3.70 cm MITRAL VALVE MV Area (PHT): 3.30 cm     SHUNTS MV Decel Time: 230 msec     Systemic VTI:  0.08 m MR Peak grad: 48.7 mmHg     Systemic Diam: 2.40 cm MR Vmax:      349.00 cm/s MV E velocity: 108.00 cm/s Armanda Magic MD Electronically signed by Armanda Magic MD Signature Date/Time: 10/28/2022/5:02:07 PM    Final    DG CHEST PORT 1 VIEW  Result Date: 10/28/2022 CLINICAL DATA:  Shortness of breath. EXAM: PORTABLE CHEST 1  VIEW COMPARISON:  October 27, 2022. FINDINGS: Stable cardiomegaly. Right internal jugular catheter is noted with distal tip in expected position of the SVC. Right upper lobe airspace opacity is noted concerning for possible pneumonia. Mild central pulmonary vascular congestion is noted. Bony thorax is unremarkable. IMPRESSION: Stable cardiomegaly with mild central pulmonary vascular congestion. Right upper lobe opacity is noted concerning for pneumonia. Electronically Signed   By: Lupita Raider M.D.   On: 10/28/2022 14:12     Medications:     Scheduled Medications:  adenosine (ADENOCARD) IV  6 mg Intravenous Once   Chlorhexidine Gluconate Cloth  6 each Topical Daily    Infusions:  sodium chloride     amiodarone 60 mg/hr (10/29/22 0700)   amiodarone     cefTRIAXone (ROCEPHIN)  IV Stopped (10/28/22 1917)   furosemide 120 mg (10/29/22 0744)   heparin 1,800 Units/hr (10/29/22 0700)   milrinone 0.25 mcg/kg/min (10/29/22 0700)   norepinephrine (LEVOPHED) Adult infusion      PRN Medications: acetaminophen, ondansetron (ZOFRAN) IV, mouth rinse, witch hazel-glycerin    Patient Profile   57 y/o  male w/ chronic systolic heart failure due to NICM, 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   Assessment/Plan   1. Acute on Chronic Systolic Heart Failure w/ Low-Output  - Echo 2011: EF 30-35%. Cath 2011 with normal cors. - Echo 10/17: EF 35-40%. - CMRI 2017 LVEF 20%. No LGE - Echo 12/21 EF 40-45%. Coronary CTA normal cors, calcium score 0   - Cardiac PET 2023- no evidence for cardiac sarcoid or inflammatory. LVEF 38%.   - recent admit 8/24, developed CGS post DCCV. Echo EF 20-25% and RV moderately reduced - now readmitted w/ ADHF in setting of recurrent AFL w/ RVR. NYHA Class IIIB w/ low output   - on Milrinone 0.25. Co-ox 59%. C/w AKI but diuresing w/ Lasix, CVP 14-15 today. SCr slowing trending down  - continue milrinone at current rate to help w/ diuresis  - continue IV Lasix 120 mg BID + Diamox 250 mg bid    - continue amio gtt for rate/rhythm control  - GDMT limited by AKI on CKD    2. Recurrent AFL w/ RVR  - failed multiple DCCVs as outlined above. Failing PO amiodarone  - back in NSR today - continue amio gtt until off inotropes  - heparin gtt while in ICU - TSH elevated (8.9). Free T4 1.22. Free T3 1.5 - CPAP at bedtime  - maintenance of NSR imperative. He does not tolerate AFL well. Multiple readmits for ADHF and shock.  - d/w EP, likely would not do well w/ AF ablation w/ severe LAE. Plan AVN ablation w/ Bi-V pacer this admit     3. AKI on CKD IIIB - likely cardiorenal, nonoliguric   - Recent Scr 1.8 but previously creatinine baseline ~ 1.2.  - Recent admit, SCr bumped to 2.2>>down to 1.9 at d/c  - SCr 2.35 on this admit, suspect low-output - SCr stable today at 2.1. Continue milrinone for inotropic support  - avoid  hypotension and follow BMP    4. Hyperkalemia - in setting of AKI  - initial K in ER 6.0, treated w/ albuterol, calcium gluconate, and insulin + Lokelma x 2 - K 4.0 today  -  monitor on tele    5. OSA  - Continue CPAP nightly.   6. ? PNA -  CXR w/ focal airspace opacity in the right mid lung field, new since prior exam. WBC 14>>21>16K - PCT 0.17  - covering w/ ceftriaxone   7. IDA - Fe 22, T sat 8 - will need IV Fe. Will wait until infection resolves    Length of Stay: 2  Robbie Lis, PA-C  10/29/2022, 7:59 AM  Advanced Heart Failure Team Pager [phone] (M-F; 7a - 5p)  Please contact [facility] for night-coverage after hours (5p -7a ) and weekends on [email or web address]

## 2022-10-29 NOTE — Plan of Care (Signed)
  Problem: Education: Goal: Knowledge of General Education information will improve Description: Including pain rating scale, medication(s)/side effects and non-pharmacologic comfort measures 10/29/2022 0704 by Arnetha Gula, RN Outcome: Progressing 10/29/2022 0704 by Arnetha Gula, RN Outcome: Progressing 10/29/2022 0704 by Arnetha Gula, RN Outcome: Progressing   Problem: Health Behavior/Discharge Planning: Goal: Ability to manage health-related needs will improve 10/29/2022 0704 by Arnetha Gula, RN Outcome: Progressing 10/29/2022 0704 by Arnetha Gula, RN Outcome: Progressing 10/29/2022 0704 by Arnetha Gula, RN Outcome: Progressing   Problem: Clinical Measurements: Goal: Ability to maintain clinical measurements within normal limits will improve 10/29/2022 0704 by Arnetha Gula, RN Outcome: Progressing 10/29/2022 0704 by Arnetha Gula, RN Outcome: Progressing 10/29/2022 0704 by Arnetha Gula, RN Outcome: Progressing Goal: Will remain free from infection 10/29/2022 0704 by Arnetha Gula, RN Outcome: Progressing 10/29/2022 0704 by Arnetha Gula, RN Outcome: Progressing 10/29/2022 0704 by Arnetha Gula, RN Outcome: Progressing Goal: Diagnostic test results will improve 10/29/2022 0704 by Arnetha Gula, RN Outcome: Progressing 10/29/2022 0704 by Arnetha Gula, RN Outcome: Progressing 10/29/2022 0704 by Arnetha Gula, RN Outcome: Progressing Goal: Respiratory complications will improve 10/29/2022 0704 by Arnetha Gula, RN Outcome: Progressing 10/29/2022 0704 by Arnetha Gula, RN Outcome: Progressing 10/29/2022 0704 by Arnetha Gula, RN Outcome: Progressing Goal: Cardiovascular complication will be avoided 10/29/2022 0704 by Arnetha Gula, RN Outcome: Progressing 10/29/2022 0704 by Arnetha Gula, RN Outcome: Progressing 10/29/2022 0704 by Arnetha Gula, RN Outcome: Progressing   Problem: Clinical Measurements: Goal: Will remain free from infection 10/29/2022 0704 by Arnetha Gula, RN Outcome:  Progressing 10/29/2022 0704 by Arnetha Gula, RN Outcome: Progressing 10/29/2022 0704 by Arnetha Gula, RN Outcome: Progressing   Problem: Clinical Measurements: Goal: Diagnostic test results will improve 10/29/2022 0704 by Arnetha Gula, RN Outcome: Progressing 10/29/2022 0704 by Arnetha Gula, RN Outcome: Progressing 10/29/2022 0704 by Arnetha Gula, RN Outcome: Progressing   Problem: Clinical Measurements: Goal: Respiratory complications will improve 10/29/2022 0704 by Arnetha Gula, RN Outcome: Progressing 10/29/2022 0704 by Arnetha Gula, RN Outcome: Progressing 10/29/2022 0704 by Arnetha Gula, RN Outcome: Progressing   Problem: Coping: Goal: Level of anxiety will decrease 10/29/2022 0704 by Arnetha Gula, RN Outcome: Progressing 10/29/2022 0704 by Arnetha Gula, RN Outcome: Progressing 10/29/2022 0704 by Arnetha Gula, RN Outcome: Progressing

## 2022-10-29 NOTE — Progress Notes (Signed)
ANTICOAGULATION CONSULT NOTE  Pharmacy Consult for IV Heparin Indication: atrial fibrillation  Allergies  Allergen Reactions   Bee Venom Anaphylaxis   Amiodarone Nausea Only   Spironolactone     Patient Measurements: Weight: (!) 147.2 kg (324 lb 8.3 oz) Heparin Dosing Weight: ~ 120 kg  Vital Signs: Temp: 97.7 F (36.5 C) (08/21 0813) Temp Source: Oral (08/21 0813) BP: 94/59 (08/21 0500) Pulse Rate: 86 (08/21 0700)  Labs: Recent Labs    10/27/22 1042 10/27/22 1042 10/27/22 1048 10/27/22 1242 10/27/22 1534 10/27/22 1955 10/28/22 0007 10/28/22 0440 10/28/22 0828 10/28/22 1722 10/29/22 0305  HGB 19.4*  --   --   --   --   --   --  17.0  --   --  16.1  HCT 58.0*  --   --   --   --   --   --  50.0  --   --  48.3  PLT 214  --   --   --   --   --   --  201  --   --  205  APTT  --    < >  --   --   --   --  46*  --  82* 93* 62*  HEPARINUNFRC  --   --   --   --   --   --  >1.10*  --   --   --  0.97*  CREATININE 2.35*  --   --   --    < > 2.36*  --  2.25*  --   --  2.14*  TROPONINIHS  --   --  35* 34*  --   --   --   --   --   --   --    < > = values in this interval not displayed.    Estimated Creatinine Clearance: 59.8 mL/min (A) (by C-G formula based on SCr of 2.14 mg/dL (H)).  Assessment: 57 yo male on chronic Eliquis for afib.  Pharmacy asked to start IV heparin 8/19 while Eliquis is on hold in case of procedures.  APTT fell to 62sec with heparin drip rate reduction last pm to 1800 uts/hr.  No overt bleeding or complications noted.  CBC stable.  Goal of Therapy:  Heparin level 0.3-0.7 units/ml APTT 66-102 sec Monitor platelets by anticoagulation protocol: Yes   Plan:  Increase IV heparin to 1850 units/hr Daily aPTT, heparin level and CBC.   Leota Sauers Pharm.D. CPP, BCPS Clinical Pharmacist [phone] 10/29/2022 8:19 AM

## 2022-10-29 NOTE — TOC Progression Note (Signed)
Transition of Care [facility]) - Progression Note    Patient Details  Name: Harold Park MRN: [id number] Date of Birth: 04-07-1965  Transition of Care [facility]) CM/SW Contact  Elliot Cousin, RN Phone Number: [phone] 10/29/2022, 3:05 PM  Clinical Narrative:   Received call from [facility] # [phone], she can assist with dc needs.          Expected Discharge Plan and Services                                               Social Determinants of Health (SDOH) Interventions SDOH Screenings   Food Insecurity: No Food Insecurity (10/15/2022)  Housing: Low Risk  (10/15/2022)  Transportation Needs: No Transportation Needs (10/15/2022)  Utilities: Not At Risk (10/15/2022)  Depression (PHQ2-9): Low Risk  (11/29/2021)  Tobacco Use: Low Risk  (10/27/2022)    Readmission Risk Interventions     No data to display

## 2022-10-30 ENCOUNTER — Encounter (HOSPITAL_COMMUNITY)
Admission: EM | Disposition: A | Discharge status: Home / Self Care | Payer: Self-pay | Source: Home / Self Care | Attending: Internal Medicine

## 2022-10-30 DIAGNOSIS — N179 Acute kidney failure, unspecified: Secondary | ICD-10-CM | POA: Diagnosis not present

## 2022-10-30 DIAGNOSIS — I5023 Acute on chronic systolic (congestive) heart failure: Secondary | ICD-10-CM | POA: Diagnosis not present

## 2022-10-30 DIAGNOSIS — N1832 Chronic kidney disease, stage 3b: Secondary | ICD-10-CM

## 2022-10-30 DIAGNOSIS — E875 Hyperkalemia: Secondary | ICD-10-CM | POA: Diagnosis not present

## 2022-10-30 HISTORY — PX: RIGHT HEART CATH: CATH118263

## 2022-10-30 LAB — BASIC METABOLIC PANEL
Anion gap: 10 (ref 5–15)
BUN: 34 mg/dL — ABNORMAL HIGH (ref 6–20)
CO2: 23 mmol/L (ref 22–32)
Calcium: 8.1 mg/dL — ABNORMAL LOW (ref 8.9–10.3)
Chloride: 95 mmol/L — ABNORMAL LOW (ref 98–111)
Creatinine, Ser: 2.1 mg/dL — ABNORMAL HIGH (ref 0.61–1.24)
GFR, Estimated: 36 mL/min — ABNORMAL LOW (ref >60)
Glucose, Bld: 125 mg/dL — ABNORMAL HIGH (ref 70–99)
Potassium: 3.3 mmol/L — ABNORMAL LOW (ref 3.5–5.1)
Sodium: 128 mmol/L — ABNORMAL LOW (ref 135–145)

## 2022-10-30 LAB — POCT I-STAT EG7
Acid-base deficit: 3 mmol/L — ABNORMAL HIGH (ref 0.0–2.0)
Bicarbonate: 22.2 mmol/L (ref 20.0–28.0)
Calcium, Ion: 1.07 mmol/L — ABNORMAL LOW (ref 1.15–1.40)
HCT: 51 % (ref 39.0–52.0)
Hemoglobin: 17.3 g/dL — ABNORMAL HIGH (ref 13.0–17.0)
O2 Saturation: 65 %
Potassium: 3.5 mmol/L (ref 3.5–5.1)
Sodium: 134 mmol/L — ABNORMAL LOW (ref 135–145)
TCO2: 23 mmol/L (ref 22–32)
pCO2, Ven: 40.6 mmHg — ABNORMAL LOW (ref 44–60)
pH, Ven: 7.347 (ref 7.25–7.43)
pO2, Ven: 36 mmHg (ref 32–45)

## 2022-10-30 LAB — COOXEMETRY PANEL
Carboxyhemoglobin: 1.1 % (ref 0.5–1.5)
Carboxyhemoglobin: 1.6 % — ABNORMAL HIGH (ref 0.5–1.5)
Methemoglobin: <0.7 % (ref 0.0–1.5)
Methemoglobin: <0.7 % (ref 0.0–1.5)
O2 Saturation: 62.7 %
O2 Saturation: 66.9 %
Total hemoglobin: 16.1 g/dL — ABNORMAL HIGH (ref 12.0–16.0)
Total hemoglobin: 16.5 g/dL — ABNORMAL HIGH (ref 12.0–16.0)

## 2022-10-30 LAB — CBC
HCT: 47.7 % (ref 39.0–52.0)
Hemoglobin: 15.9 g/dL (ref 13.0–17.0)
MCH: 28.9 pg (ref 26.0–34.0)
MCHC: 33.3 g/dL (ref 30.0–36.0)
MCV: 86.7 fL (ref 80.0–100.0)
Platelets: 184 10*3/uL (ref 150–400)
RBC: 5.5 MIL/uL (ref 4.22–5.81)
RDW: 16.4 % — ABNORMAL HIGH (ref 11.5–15.5)
WBC: 14.5 10*3/uL — ABNORMAL HIGH (ref 4.0–10.5)
nRBC: 0 % (ref 0.0–0.2)

## 2022-10-30 LAB — HEPARIN LEVEL (UNFRACTIONATED): Heparin Unfractionated: 0.77 [IU]/mL — ABNORMAL HIGH (ref 0.30–0.70)

## 2022-10-30 LAB — ABO/RH: ABO/RH(D): O POS

## 2022-10-30 LAB — MAGNESIUM: Magnesium: 2.3 mg/dL (ref 1.7–2.4)

## 2022-10-30 LAB — CG4 I-STAT (LACTIC ACID): Lactic Acid, Venous: 0.9 mmol/L (ref 0.5–1.9)

## 2022-10-30 LAB — APTT: aPTT: 106 s — ABNORMAL HIGH (ref 24–36)

## 2022-10-30 SURGERY — RIGHT HEART CATH
Anesthesia: LOCAL

## 2022-10-30 MED ORDER — POTASSIUM CHLORIDE CRYS ER 20 MEQ PO TBCR
40.0000 meq | EXTENDED_RELEASE_TABLET | Freq: Once | ORAL | Status: AC
  Administered 2022-10-30: 40 meq via ORAL
  Filled 2022-10-30: qty 2

## 2022-10-30 MED ORDER — FUROSEMIDE 10 MG/ML IJ SOLN
8.0000 mg/h | INTRAVENOUS | Status: DC
  Administered 2022-10-30 (×2): 25 mg/h via INTRAVENOUS
  Administered 2022-10-31: 15 mg/h via INTRAVENOUS
  Administered 2022-10-31: 25 mg/h via INTRAVENOUS
  Administered 2022-11-01 (×2): 15 mg/h via INTRAVENOUS
  Filled 2022-10-30 (×8): qty 20

## 2022-10-30 MED ORDER — LIDOCAINE HCL (PF) 1 % IJ SOLN
INTRAMUSCULAR | Status: DC | PRN
  Administered 2022-10-30: 2 mL via INTRADERMAL

## 2022-10-30 MED ORDER — LIDOCAINE HCL (PF) 1 % IJ SOLN
INTRAMUSCULAR | Status: AC
  Filled 2022-10-30: qty 30

## 2022-10-30 MED ORDER — HEPARIN (PORCINE) IN NACL 1000-0.9 UT/500ML-% IV SOLN
INTRAVENOUS | Status: DC | PRN
  Administered 2022-10-30: 500 mL

## 2022-10-30 MED ORDER — SODIUM CHLORIDE 0.9 % IV SOLN: INTRAVENOUS | Status: DC

## 2022-10-30 MED ORDER — MIDAZOLAM HCL 2 MG/2ML IJ SOLN
INTRAMUSCULAR | Status: AC
  Filled 2022-10-30: qty 2

## 2022-10-30 MED ORDER — MIDAZOLAM HCL 2 MG/2ML IJ SOLN
INTRAMUSCULAR | Status: DC | PRN
  Administered 2022-10-30: 1 mg via INTRAVENOUS

## 2022-10-30 MED ORDER — METOLAZONE 5 MG PO TABS
5.0000 mg | ORAL_TABLET | Freq: Once | ORAL | Status: AC
  Administered 2022-10-30: 5 mg via ORAL
  Filled 2022-10-30: qty 1

## 2022-10-30 MED ORDER — AMIODARONE LOAD VIA INFUSION
150.0000 mg | Freq: Once | INTRAVENOUS | Status: AC
  Administered 2022-10-30: 150 mg via INTRAVENOUS
  Filled 2022-10-30: qty 83.34

## 2022-10-30 SURGICAL SUPPLY — 8 items
CATH SWAN GANZ VIP 7.5F (CATHETERS) IMPLANT
CATH-GARD ARROW CATH SHIELD (MISCELLANEOUS) ×1
KIT MICROPUNCTURE NIT STIFF (SHEATH) IMPLANT
PACK CARDIAC CATHETERIZATION (CUSTOM PROCEDURE TRAY) ×1 IMPLANT
SHEATH PINNACLE 8F 10CM (SHEATH) IMPLANT
SHEATH PROBE COVER 6X72 (BAG) IMPLANT
SHIELD CATHGARD ARROW (MISCELLANEOUS) IMPLANT
WIRE EMERALD 3MM-J .025X260CM (WIRE) IMPLANT

## 2022-10-30 NOTE — Progress Notes (Signed)
Advanced Heart Failure Rounding Note  PCP-Cardiologist: Armanda Magic, MD   Subjective:    Remains in NSR. On Amio gtt at 30/hr.   UOP decreased and CVP rose yesterday, milrinone increased to 0.375 to augment diuresis. 2.5L in UOP yesterday but only net neg 645cc. Weight up 4lbs. CVP 19. Co-ox 63%  Scr 2.36>>2.25>>2.14>>2.1 K 3.3  On ceftriaxone for possible PNA. WBC 21>>16>14.5K.   Lactic acid 2.8>>3.1>>1.9>>1.1  Feels a little better today. Breathing stable.   Objective:   Echo 8/20: EF 25-30%, RV mod reduced. IVC dilated estimated RAP ~15   Weight Range: (!) 148.8 kg Body mass index is 38.9 kg/m.   Vital Signs:   Temp:  [97.8 F (36.6 C)-98.4 F (36.9 C)] 97.8 F (36.6 C) (08/22 0752) Pulse Rate:  [78-101] 79 (08/22 0700) Resp:  [16-32] 17 (08/22 0700) BP: (94-123)/(67-101) 107/79 (08/22 0700) SpO2:  [94 %-100 %] 100 % (08/22 0700) Weight:  [148.8 kg] 148.8 kg (08/22 0500) Last BM Date : 10/28/22  Weight change: Filed Weights   10/28/22 0500 10/29/22 0500 10/30/22 0500  Weight: (!) 149 kg (!) 147.2 kg (!) 148.8 kg    Intake/Output:   Intake/Output Summary (Last 24 hours) at 10/30/2022 0851 Last data filed at 10/30/2022 0743 Gross per 24 hour  Intake 1852.15 ml  Output 2625 ml  Net -772.85 ml    Physical Exam  CVP 19 General:  well appearing.  No respiratory difficulty HEENT: normal Neck: supple. JVD elevated. Carotids 2+ bilat; no bruits. No lymphadenopathy or thyromegaly appreciated. RIJ CVC Cor: PMI nondisplaced. Regular rate & rhythm. No rubs, gallops or murmurs. Lungs: clear Abdomen: soft, nontender, nondistended. No hepatosplenomegaly. No bruits or masses. Good bowel sounds. Extremities: no cyanosis, clubbing, rash, edema Neuro: alert & oriented x 3, cranial nerves grossly intact. moves all 4 extremities w/o difficulty. Affect pleasant.   Telemetry   NSR 80s 1-5 PVCs/hr (Personally reviewed)    EKG    No new EKG to review   Labs     CBC Recent Labs    10/27/22 1042 10/28/22 0440 10/29/22 0305 10/30/22 0407  WBC 14.2*   < > 16.1* 14.5*  NEUTROABS 10.8*  --   --   --   HGB 19.4*   < > 16.1 15.9  HCT 58.0*   < > 48.3 47.7  MCV 87.5   < > 86.1 86.7  PLT 214   < > 205 184   < > = values in this interval not displayed.   Basic Metabolic Panel Recent Labs    29/52/84 0305 10/29/22 1454 10/30/22 0407  NA 131* 130* 128*  K 4.0 4.0 3.3*  CL 95* 97* 95*  CO2 22 23 23   GLUCOSE 138* 133* 125*  BUN 36* 36* 34*  CREATININE 2.14* 2.14* 2.10*  CALCIUM 8.2* 7.9* 8.1*  MG 2.2  --  2.3   Liver Function Tests Recent Labs    10/27/22 1042  AST 152*  ALT 114*  ALKPHOS 91  BILITOT 4.8*  PROT 6.7  ALBUMIN 3.8   Recent Labs    10/27/22 1042  LIPASE 14   Cardiac Enzymes No results for input(s): "CKTOTAL", "CKMB", "CKMBINDEX", "TROPONINI" in the last 72 hours.  BNP: BNP (last 3 results) Recent Labs    10/01/22 1020 10/15/22 0126 10/27/22 1049  BNP 822.7* 777.2* 1,723.3*    ProBNP (last 3 results) No results for input(s): "PROBNP" in the last 8760 hours.   D-Dimer No results for input(s): "DDIMER"  in the last 72 hours. Hemoglobin A1C No results for input(s): "HGBA1C" in the last 72 hours. Fasting Lipid Panel No results for input(s): "CHOL", "HDL", "LDLCALC", "TRIG", "CHOLHDL", "LDLDIRECT" in the last 72 hours. Thyroid Function Tests Recent Labs    10/27/22 1049 10/28/22 0440  TSH 8.889*  --   T3FREE  --  1.5*    Other results:   Imaging    No results found.   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   Assessment/Plan  1. Acute on Chronic Systolic Heart Failure w/ Low-Output  - Echo 2011: EF 30-35%. Cath 2011 with normal cors. - Echo 10/17: EF 35-40%. - CMRI 2017 LVEF 20%. No LGE - Echo 12/21 EF 40-45%. Coronary CTA normal cors, calcium score 0   - Cardiac PET 2023- no evidence for cardiac sarcoid or inflammatory. LVEF 38%.   - recent admit 8/24, developed CGS post DCCV. Echo EF 20-25% and RV moderately reduced - now readmitted w/ ADHF in setting of recurrent AFL w/ RVR. NYHA Class IIIB w/ low output   - on Milrinone 0.375. Co-ox 63%. C/w AKI but diuresing w/ Lasix, CVP 19 today. SCr slowing trending down  - continue IV Lasix 120 mg BID + Diamox 250 mg bid    - RHC scheduled for later this morning. May need Swann, suspect he will need lasix gtt - continue amio gtt for rate/rhythm control  - GDMT limited by AKI on CKD    2. Recurrent AFL w/ RVR  - failed multiple DCCVs as outlined above. Failing PO amiodarone  - back in NSR today - continue amio gtt until off inotropes  - heparin gtt while in ICU - TSH elevated (8.9). Free T4 1.22. Free T3 1.5 - CPAP at bedtime  - maintenance of NSR imperative. He does not tolerate AFL well. Multiple readmits for ADHF and shock.  - d/w EP, likely would not do well w/ AF ablation w/ severe BAE. Plan AVN ablation w/ Bi-V pacer this admit   3. AKI on CKD IIIB - likely cardiorenal, nonoliguric   - Recent Scr 1.8 but previously creatinine baseline ~ 1.2.  - Recent admit, SCr bumped to 2.2>>down to 1.9  at d/c  - SCr 2.35 on this admit, suspect low-output - SCr stable today at 2.1. Continue milrinone for inotropic support  - avoid hypotension and follow BMP    4. Hyperkalemia - in setting of AKI  - initial K in ER 6.0, treated w/ albuterol, calcium gluconate, and insulin + Lokelma x 2 - K 3.3 today, replete   5. OSA  - Continue CPAP nightly.   6. ? PNA -  CXR w/  focal airspace opacity in the right mid lung field, new since prior exam. WBC 14>>21>16K - PCT 0.17  - covering w/ ceftriaxone   7. IDA - Fe 22, T sat 8 - will need IV Fe. Will wait until infection resolves    Length of Stay: 3  Alen Bleacher, NP  10/30/2022, 8:51 AM  Advanced Heart Failure Team Pager [phone] (M-F; 7a - 5p)  Please contact [facility] for night-coverage after hours (5p -7a ) and weekends on [email or web address]

## 2022-10-30 NOTE — Progress Notes (Addendum)
ANTICOAGULATION CONSULT NOTE  Pharmacy Consult for IV Heparin Indication: atrial fibrillation  Allergies  Allergen Reactions   Bee Venom Anaphylaxis   Amiodarone Nausea Only   Spironolactone     Patient Measurements: Weight: (!) 148.8 kg (328 lb 0.7 oz) Heparin Dosing Weight: ~ 120 kg  Vital Signs: Temp: 97.8 F (36.6 C) (08/22 0752) Temp Source: Oral (08/22 0752) BP: 107/79 (08/22 0700) Pulse Rate: 79 (08/22 0700)  Labs: Recent Labs    10/27/22 1048 10/27/22 1242 10/27/22 1534 10/28/22 0007 10/28/22 0440 10/28/22 0828 10/28/22 1722 10/29/22 0305 10/29/22 1454 10/30/22 0407  HGB  --   --   --   --  17.0  --   --  16.1  --  15.9  HCT  --   --   --   --  50.0  --   --  48.3  --  47.7  PLT  --   --   --   --  201  --   --  205  --  184  APTT  --   --   --  46*  --    < > 93* 62*  --  106*  HEPARINUNFRC  --   --   --  >1.10*  --   --   --  0.97*  --  0.77*  CREATININE  --   --    < >  --  2.25*  --   --  2.14* 2.14* 2.10*  TROPONINIHS 35* 34*  --   --   --   --   --   --   --   --    < > = values in this interval not displayed.    Estimated Creatinine Clearance: 61.3 mL/min (A) (by C-G formula based on SCr of 2.1 mg/dL (H)).  Assessment: 57 yo male on chronic Eliquis for afib.  Pharmacy asked to start IV heparin 8/19 while Eliquis is on hold in case of procedures.  aPTT and heparin level both elevated, closer to correlating so will stop aPTT checks.   Goal of Therapy:  Heparin level 0.3-0.7 units/ml APTT 66-102 sec Monitor platelets by anticoagulation protocol: Yes   Plan:  Reduce heparin to 1800 units/h Daily heparin level and CBC  ADDENDUM: heparin ok to resume post/cath now per MD  Fredonia Highland, PharmD, BCPS, [facility] Clinical Pharmacist [phone] Please check AMION for all [facility] Pharmacy numbers 10/30/2022

## 2022-10-30 NOTE — Progress Notes (Signed)
Maintaining SR Occ PVCs, NSVT (3-4 beats) On amiodarone gtt  Dr. Morrie Sheldon thought ar that he will not be able to maintain SR He has very poorly tolerated PO amiodarone (in effort to treat his AF and PVCs) Suspect he will need CRT-D and AV node ablation HF status worse yesterday afternoon >> increased milrinone  Pending RHC  No expectation that he will be ready for EP Procedure this week Wew will follow the chart Anticipate next week.  Francis Dowse, PA-C

## 2022-10-31 ENCOUNTER — Encounter (HOSPITAL_COMMUNITY): Payer: Self-pay | Admitting: Cardiology

## 2022-10-31 DIAGNOSIS — N179 Acute kidney failure, unspecified: Secondary | ICD-10-CM | POA: Diagnosis not present

## 2022-10-31 DIAGNOSIS — E875 Hyperkalemia: Secondary | ICD-10-CM | POA: Diagnosis not present

## 2022-10-31 DIAGNOSIS — N1832 Chronic kidney disease, stage 3b: Secondary | ICD-10-CM | POA: Diagnosis not present

## 2022-10-31 DIAGNOSIS — I5023 Acute on chronic systolic (congestive) heart failure: Secondary | ICD-10-CM | POA: Diagnosis not present

## 2022-10-31 LAB — BASIC METABOLIC PANEL
Anion gap: 11 (ref 5–15)
Anion gap: 14 (ref 5–15)
Anion gap: 13 (ref 5–15)
Anion gap: 13 (ref 5–15)
BUN: 32 mg/dL — ABNORMAL HIGH (ref 6–20)
BUN: 29 mg/dL — ABNORMAL HIGH (ref 6–20)
BUN: 29 mg/dL — ABNORMAL HIGH (ref 6–20)
BUN: 28 mg/dL — ABNORMAL HIGH (ref 6–20)
CO2: 24 mmol/L (ref 22–32)
CO2: 24 mmol/L (ref 22–32)
CO2: 27 mmol/L (ref 22–32)
CO2: 27 mmol/L (ref 22–32)
Calcium: 8.4 mg/dL — ABNORMAL LOW (ref 8.9–10.3)
Calcium: 8.6 mg/dL — ABNORMAL LOW (ref 8.9–10.3)
Calcium: 8.5 mg/dL — ABNORMAL LOW (ref 8.9–10.3)
Calcium: 8.5 mg/dL — ABNORMAL LOW (ref 8.9–10.3)
Chloride: 97 mmol/L — ABNORMAL LOW (ref 98–111)
Chloride: 94 mmol/L — ABNORMAL LOW (ref 98–111)
Chloride: 93 mmol/L — ABNORMAL LOW (ref 98–111)
Chloride: 93 mmol/L — ABNORMAL LOW (ref 98–111)
Creatinine, Ser: 1.89 mg/dL — ABNORMAL HIGH (ref 0.61–1.24)
Creatinine, Ser: 1.8 mg/dL — ABNORMAL HIGH (ref 0.61–1.24)
Creatinine, Ser: 1.89 mg/dL — ABNORMAL HIGH (ref 0.61–1.24)
Creatinine, Ser: 2.02 mg/dL — ABNORMAL HIGH (ref 0.61–1.24)
GFR, Estimated: 41 mL/min — ABNORMAL LOW (ref >60)
GFR, Estimated: 43 mL/min — ABNORMAL LOW (ref >60)
GFR, Estimated: 41 mL/min — ABNORMAL LOW (ref >60)
GFR, Estimated: 38 mL/min — ABNORMAL LOW (ref >60)
Glucose, Bld: 125 mg/dL — ABNORMAL HIGH (ref 70–99)
Glucose, Bld: 121 mg/dL — ABNORMAL HIGH (ref 70–99)
Glucose, Bld: 179 mg/dL — ABNORMAL HIGH (ref 70–99)
Glucose, Bld: 121 mg/dL — ABNORMAL HIGH (ref 70–99)
Potassium: 2.9 mmol/L — ABNORMAL LOW (ref 3.5–5.1)
Potassium: 3 mmol/L — ABNORMAL LOW (ref 3.5–5.1)
Potassium: 2.9 mmol/L — ABNORMAL LOW (ref 3.5–5.1)
Potassium: 3.2 mmol/L — ABNORMAL LOW (ref 3.5–5.1)
Sodium: 132 mmol/L — ABNORMAL LOW (ref 135–145)
Sodium: 132 mmol/L — ABNORMAL LOW (ref 135–145)
Sodium: 133 mmol/L — ABNORMAL LOW (ref 135–145)
Sodium: 133 mmol/L — ABNORMAL LOW (ref 135–145)

## 2022-10-31 LAB — COOXEMETRY PANEL
Carboxyhemoglobin: 1.5 % (ref 0.5–1.5)
Methemoglobin: 0.7 % (ref 0.0–1.5)
O2 Saturation: 67.2 %
Total hemoglobin: 17.2 g/dL — ABNORMAL HIGH (ref 12.0–16.0)

## 2022-10-31 LAB — MAGNESIUM
Magnesium: 2.3 mg/dL (ref 1.7–2.4)
Magnesium: 2.4 mg/dL (ref 1.7–2.4)

## 2022-10-31 LAB — CBC
HCT: 49.6 % (ref 39.0–52.0)
Hemoglobin: 17.1 g/dL — ABNORMAL HIGH (ref 13.0–17.0)
MCH: 28.8 pg (ref 26.0–34.0)
MCHC: 34.5 g/dL (ref 30.0–36.0)
MCV: 83.5 fL (ref 80.0–100.0)
Platelets: 190 10*3/uL (ref 150–400)
RBC: 5.94 MIL/uL — ABNORMAL HIGH (ref 4.22–5.81)
RDW: 16.4 % — ABNORMAL HIGH (ref 11.5–15.5)
WBC: 12.2 10*3/uL — ABNORMAL HIGH (ref 4.0–10.5)
nRBC: 0 % (ref 0.0–0.2)

## 2022-10-31 LAB — HEPARIN LEVEL (UNFRACTIONATED): Heparin Unfractionated: 0.42 [IU]/mL (ref 0.30–0.70)

## 2022-10-31 MED ORDER — POTASSIUM CHLORIDE CRYS ER 20 MEQ PO TBCR
60.0000 meq | EXTENDED_RELEASE_TABLET | ORAL | Status: AC
  Administered 2022-10-31 (×3): 60 meq via ORAL
  Filled 2022-10-31 (×3): qty 3

## 2022-10-31 MED ORDER — POTASSIUM CHLORIDE CRYS ER 20 MEQ PO TBCR
60.0000 meq | EXTENDED_RELEASE_TABLET | Freq: Once | ORAL | Status: AC
  Administered 2022-10-31: 60 meq via ORAL
  Filled 2022-10-31: qty 3

## 2022-10-31 MED ORDER — POTASSIUM CHLORIDE CRYS ER 20 MEQ PO TBCR
80.0000 meq | EXTENDED_RELEASE_TABLET | Freq: Once | ORAL | Status: AC
  Administered 2022-10-31: 80 meq via ORAL
  Filled 2022-10-31: qty 4

## 2022-10-31 MED ORDER — POTASSIUM CHLORIDE CRYS ER 20 MEQ PO TBCR
60.0000 meq | EXTENDED_RELEASE_TABLET | ORAL | Status: AC
  Administered 2022-10-31 (×2): 60 meq via ORAL
  Filled 2022-10-31 (×2): qty 3

## 2022-10-31 MED ORDER — METOLAZONE 2.5 MG PO TABS
2.5000 mg | ORAL_TABLET | Freq: Once | ORAL | Status: AC
  Administered 2022-10-31: 2.5 mg via ORAL
  Filled 2022-10-31: qty 1

## 2022-10-31 NOTE — Progress Notes (Signed)
   10/30/22 2210  BiPAP/CPAP/SIPAP  Reason BIPAP/CPAP not in use Non-compliant  BiPAP/CPAP /SiPAP Vitals  Temp (!) 97.2 F (36.2 C)  Pulse Rate 88  Resp (!) 23  SpO2 96 %  MEWS Score/Color  MEWS Score 1  MEWS Score Color Green

## 2022-10-31 NOTE — Progress Notes (Signed)
Advanced Heart Failure Rounding Note  PCP-Cardiologist: Armanda Magic, MD   Subjective:   Echo 8/20: EF 25-30%, RV mod reduced. IVC dilated estimated RAP ~15  RHC 8/22 with: RA 21, PA 56/30 (42), PCWP 30 v waves to 55, Fick CO/CI 5.9/2.1, TD CO/CI 4.6/1.7. PAPi 1.2 8/22. Milrinone increased to 0.5 and lasix gtt started at 25mg /hr  Remains in NSR. On Amio gtt at 30/hr.   UOP much improved, -10L yesterday. Down 10lbs. CVP 19>7/8 today  Scr 2.36>>2.25>>2.14>>2.1>>1.8 K 3  On ceftriaxone for possible PNA. WBC 21>>16>14.5>12.2K.   Lactic acid 2.8>>3.1>>1.9>>1.1>0.9  Swann #s PAP 46/17 (27) CO 5.48 CI 1.99 CVP 8  Feels better today. Able to lay flatter and sleep with CPAP.   Objective:     Weight Range: (!) 144.4 kg Body mass index is 37.75 kg/m.   Vital Signs:   Temp:  [96.3 F (35.7 C)-97.8 F (36.6 C)] 96.4 F (35.8 C) (08/23 0700) Pulse Rate:  [45-99] 82 (08/23 0700) Resp:  [13-26] 13 (08/23 0700) BP: (84-152)/(65-133) 101/80 (08/23 0700) SpO2:  [81 %-98 %] 96 % (08/23 0700) Weight:  [144.4 kg] 144.4 kg (08/23 0500) Last BM Date : 10/29/22  Weight change: Filed Weights   10/29/22 0500 10/30/22 0500 10/31/22 0500  Weight: (!) 147.2 kg (!) 148.8 kg (!) 144.4 kg    Intake/Output:   Intake/Output Summary (Last 24 hours) at 10/31/2022 0724 Last data filed at 10/31/2022 0701 Gross per 24 hour  Intake 1776.65 ml  Output 59563 ml  Net -8448.35 ml    Physical Exam  CVP 7/8 General:  well appearing.  No respiratory difficulty HEENT: normal Neck: supple. JVD ~9 cm. Carotids 2+ bilat; no bruits. No lymphadenopathy or thyromegaly appreciated. RIJ CVC, L Swann Cor: PMI nondisplaced. Regular rate & rhythm. No rubs, gallops or murmurs. Lungs: clear Abdomen: soft, nontender, nondistended. No hepatosplenomegaly. No bruits or masses. Good bowel sounds. Extremities: no cyanosis, clubbing, rash, trace BLE edema. + TED hose GU: +foley Neuro: alert & oriented x 3,  cranial nerves grossly intact. moves all 4 extremities w/o difficulty. Affect pleasant.   Telemetry   NSR 80s, intt PVCs (Personally reviewed)    EKG    No new EKG to review   Labs    CBC Recent Labs    10/30/22 0407 10/30/22 1148 10/31/22 0455  WBC 14.5*  --  12.2*  HGB 15.9 17.3* 17.1*  HCT 47.7 51.0 49.6  MCV 86.7  --  83.5  PLT 184  --  190   Basic Metabolic Panel Recent Labs    87/56/43 2310 10/31/22 0455  NA 132* 132*  K 2.9* 3.0*  CL 97* 94*  CO2 24 24  GLUCOSE 125* 121*  BUN 32* 29*  CREATININE 1.89* 1.80*  CALCIUM 8.4* 8.6*  MG 2.3 2.4   Liver Function Tests No results for input(s): "AST", "ALT", "ALKPHOS", "BILITOT", "PROT", "ALBUMIN" in the last 72 hours.  No results for input(s): "LIPASE", "AMYLASE" in the last 72 hours.  Cardiac Enzymes No results for input(s): "CKTOTAL", "CKMB", "CKMBINDEX", "TROPONINI" in the last 72 hours.  BNP: BNP (last 3 results) Recent Labs    10/01/22 1020 10/15/22 0126 10/27/22 1049  BNP 822.7* 777.2* 1,723.3*    ProBNP (last 3 results) No results for input(s): "PROBNP" in the last 8760 hours.   D-Dimer No results for input(s): "DDIMER" in the last 72 hours. Hemoglobin A1C No results for input(s): "HGBA1C" in the last 72 hours. Fasting Lipid Panel No results  for input(s): "CHOL", "HDL", "LDLCALC", "TRIG", "CHOLHDL", "LDLDIRECT" in the last 72 hours. Thyroid Function Tests No results for input(s): "TSH", "T4TOTAL", "T3FREE", "THYROIDAB" in the last 72 hours.  Invalid input(s): "FREET3"   Other results:   Imaging    CARDIAC CATHETERIZATION  Result Date: 10/30/2022 HEMODYNAMICS: RA:   21 mmHg (mean) RV:   45/20-25 mmHg (dampened) PA:   56/30 mmHg (42 mean) PCWP:  30 mmHg with V waves up to    Estimated Fick CO/CI   5.9 L/min, 2.1 L/min/m2 Thermodilution CO/CI   4.6 L/min, 1.7 L/min/m2    TPG    12  mmHg     PVR     2-2.6 Wood Units PAPi      1.2  IMPRESSION: Severely elevated pre and post  capillary filling pressures. Moderate elevated PA mean in the setting of volume overload. Large V waves on PCWP tracing 2/2 volume overload, noncompliant LA and functional MR. Hemodynamic parameters consistent with suboptimal RV function. RECOMMENDATIONS: Low threshold to escalate to MCS. Milrinone 0.33mcg/kg/min + aggressive IV diuresis. Aditya Sabharwal Advanced Heart Failure Mechanical Circulatory Support 2:02 PM     Medications:     Scheduled Medications:  Chlorhexidine Gluconate Cloth  6 each Topical Daily    Infusions:  sodium chloride     amiodarone 60 mg/hr (10/31/22 0701)   cefTRIAXone (ROCEPHIN)  IV Stopped (10/30/22 1821)   furosemide (LASIX) 200 mg in dextrose 5 % 100 mL (2 mg/mL) infusion 25 mg/hr (10/31/22 0701)   heparin 1,800 Units/hr (10/31/22 0701)   milrinone 0.5 mcg/kg/min (10/31/22 0701)   norepinephrine (LEVOPHED) Adult infusion      PRN Medications: acetaminophen, ondansetron (ZOFRAN) IV, mouth rinse, witch hazel-glycerin  Patient Profile   57 y/o male w/ chronic systolic heart failure due to NICM, 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   Assessment/Plan  1. Acute on Chronic Systolic Heart Failure w/ Low-Output  - Echo 2011: EF 30-35%. Cath 2011 with normal cors. - Echo 10/17: EF 35-40%. - CMRI 2017 LVEF 20%. No LGE - Echo 12/21 EF 40-45%. Coronary CTA normal cors, calcium score 0   - Cardiac PET 2023- no evidence for cardiac sarcoid or inflammatory. LVEF 38%.   - recent admit 8/24, developed CGS post DCCV. Echo EF 20-25% and RV moderately reduced - now readmitted w/ ADHF in setting of recurrent AFL w/ RVR. NYHA Class IIIB w/ low output   - on Milrinone 0.375. Co-ox 63%. C/w AKI but diuresing w/ Lasix, CVP 19  today. SCr slowing trending down  - RHC yesterday with: RA 21, PA 56/30 (42), PCWP 30 v waves to 55, Fick CO/CI 5.9/2.1, TD CO/CI 4.6/1.7. PAPi 1.2 - Decrease lasix gtt at 25>15mg /hr. -10L UOP yesterday. CVP 7/8 today.  - continue amio gtt for rate/rhythm control  - GDMT limited by AKI on CKD    2. Recurrent AFL w/ RVR  - failed multiple DCCVs as outlined above. Failing PO amiodarone  - remains in NSR today - continue amio gtt until off inotropes  - heparin gtt while in ICU - TSH elevated (8.9). Free T4 1.22. Free T3 1.5 - CPAP at bedtime  - maintenance of NSR imperative. He does not tolerate AFL well. Multiple readmits for ADHF and shock.  - d/w EP,  likely would not do well w/ AF ablation w/ severe BAE. Plan AVN ablation w/ Bi-V pacer this admit   3. AKI on CKD IIIB - likely cardiorenal, nonoliguric   - Recent Scr 1.8 but previously creatinine baseline ~ 1.2.  - Recent admit, SCr bumped to 2.2>>down to 1.9 at d/c  - SCr 2.35 on this admit, suspect low-output - SCr stable today at 1.8. Continue milrinone for inotropic support, renal function improving - avoid hypotension and follow BMP    4. Hyperkalemia - in setting of AKI  - initial K in ER 6.0, treated w/ albuterol, calcium gluconate, and insulin + Lokelma x 2 - K 3 today, replete   5. OSA  - Continue CPAP nightly.   6. ? PNA -  CXR w/ focal airspace opacity in the right mid lung field, new since prior exam. WBC 14>>21>16K - PCT 0.17  - covering w/ ceftriaxone   7. IDA - Fe 22, T sat 8 - will need IV Fe. Will wait until infection resolves    Length of Stay: 4  Alen Bleacher, NP  10/31/2022, 7:24 AM  Advanced Heart Failure Team Pager [phone] (M-F; 7a - 5p)  Please contact [facility] for night-coverage after hours (5p -7a ) and weekends on [email or web address]

## 2022-10-31 NOTE — Progress Notes (Signed)
ANTICOAGULATION CONSULT NOTE  Pharmacy Consult for IV Heparin Indication: atrial fibrillation  Allergies  Allergen Reactions   Bee Venom Anaphylaxis   Amiodarone Nausea Only   Spironolactone     Patient Measurements: Weight: (!) 144.4 kg (318 lb 5.5 oz) Heparin Dosing Weight: ~ 120 kg  Vital Signs: Temp: 96.4 F (35.8 C) (08/23 0700) Temp Source: Core (08/23 0400) BP: 101/80 (08/23 0700) Pulse Rate: 82 (08/23 0700)  Labs: Recent Labs     0000 10/28/22 1722 10/29/22 0305 10/29/22 1454 10/30/22 0407 10/30/22 1148 10/30/22 2310 10/31/22 0455  HGB   < >  --  16.1  --  15.9 17.3*  --  17.1*  HCT   < >  --  48.3  --  47.7 51.0  --  49.6  PLT  --   --  205  --  184  --   --  190  APTT  --  93* 62*  --  106*  --   --   --   HEPARINUNFRC  --   --  0.97*  --  0.77*  --   --  0.42  CREATININE  --   --  2.14*   < > 2.10*  --  1.89* 1.80*   < > = values in this interval not displayed.    Estimated Creatinine Clearance: 70.4 mL/min (A) (by C-G formula based on SCr of 1.8 mg/dL (H)).  Assessment: 57 yo male on chronic Eliquis for afib.  Pharmacy asked to start IV heparin 8/19 while Eliquis is on hold in case of procedures.  Heparin level therapeutic at 0.42, CBC remains stable.  Goal of Therapy:  Heparin level 0.3-0.7 units/ml APTT 66-102 sec Monitor platelets by anticoagulation protocol: Yes   Plan:  Continue heparin to 1800 units/h Daily heparin level and CBC   Fredonia Highland, PharmD, [place], [facility] Clinical Pharmacist [phone] Please check AMION for all [facility] Pharmacy numbers 10/31/2022

## 2022-10-31 NOTE — Plan of Care (Signed)

## 2022-10-31 NOTE — Progress Notes (Signed)
Telemetry reviewed Mostly maintaining SR, had some AFib/flutter for a couple hours last night rates 110's or so PVCs wax/wane occ couplets 3-4beat NSVTs  Dr. Morrie Sheldon thought ar that he will not be able to maintain SR He has very poorly tolerated PO amiodarone (in effort to treat his AF and PVCs) Suspect he will need CRT-D and AV node ablation HF status worse   RHC yesterday HEMODYNAMICS: RA:                  21 mmHg (mean) RV:                  45/20-25 mmHg (dampened) PA:                  56/30 mmHg (42 mean) PCWP:            30 mmHg with V waves up to                                      Estimated Fick CO/CI   5.9 L/min, 2.1 L/min/m2 Thermodilution CO/CI   4.6 L/min, 1.7 L/min/m2                                              TPG                 12  mmHg                                            PVR                 2-2.6 Wood Units  PAPi                1.2       IMPRESSION: Severely elevated pre and post capillary filling pressures.  Moderate elevated PA mean in the setting of volume overload.  Large V waves on PCWP tracing 2/2 volume overload, noncompliant LA and functional MR.  Hemodynamic parameters consistent with suboptimal RV function.    RECOMMENDATIONS: Low threshold to escalate to MCS.  Milrinone 0.87mcg/kg/min + aggressive IV diuresis.      EP will follow from [place] Hopefully will be ready for next week Please call if needed over the weekend  Francis Dowse, [place]

## 2022-11-01 DIAGNOSIS — I5023 Acute on chronic systolic (congestive) heart failure: Secondary | ICD-10-CM | POA: Diagnosis not present

## 2022-11-01 DIAGNOSIS — N1832 Chronic kidney disease, stage 3b: Secondary | ICD-10-CM | POA: Diagnosis not present

## 2022-11-01 DIAGNOSIS — N179 Acute kidney failure, unspecified: Secondary | ICD-10-CM | POA: Diagnosis not present

## 2022-11-01 DIAGNOSIS — E875 Hyperkalemia: Secondary | ICD-10-CM | POA: Diagnosis not present

## 2022-11-01 LAB — BASIC METABOLIC PANEL
Anion gap: 12 (ref 5–15)
Anion gap: 13 (ref 5–15)
BUN: 26 mg/dL — ABNORMAL HIGH (ref 6–20)
BUN: 26 mg/dL — ABNORMAL HIGH (ref 6–20)
CO2: 29 mmol/L (ref 22–32)
CO2: 25 mmol/L (ref 22–32)
Calcium: 8.6 mg/dL — ABNORMAL LOW (ref 8.9–10.3)
Calcium: 7.8 mg/dL — ABNORMAL LOW (ref 8.9–10.3)
Chloride: 93 mmol/L — ABNORMAL LOW (ref 98–111)
Chloride: 94 mmol/L — ABNORMAL LOW (ref 98–111)
Creatinine, Ser: 1.79 mg/dL — ABNORMAL HIGH (ref 0.61–1.24)
Creatinine, Ser: 1.71 mg/dL — ABNORMAL HIGH (ref 0.61–1.24)
GFR, Estimated: 43 mL/min — ABNORMAL LOW (ref >60)
GFR, Estimated: 46 mL/min — ABNORMAL LOW (ref >60)
Glucose, Bld: 138 mg/dL — ABNORMAL HIGH (ref 70–99)
Glucose, Bld: 183 mg/dL — ABNORMAL HIGH (ref 70–99)
Potassium: 3.2 mmol/L — ABNORMAL LOW (ref 3.5–5.1)
Potassium: 3.3 mmol/L — ABNORMAL LOW (ref 3.5–5.1)
Sodium: 134 mmol/L — ABNORMAL LOW (ref 135–145)
Sodium: 132 mmol/L — ABNORMAL LOW (ref 135–145)

## 2022-11-01 LAB — COOXEMETRY PANEL
Carboxyhemoglobin: 1.8 % — ABNORMAL HIGH (ref 0.5–1.5)
Carboxyhemoglobin: 2.2 % — ABNORMAL HIGH (ref 0.5–1.5)
Methemoglobin: <0.7 % (ref 0.0–1.5)
Methemoglobin: <0.7 % (ref 0.0–1.5)
O2 Saturation: 80.7 %
O2 Saturation: 76.9 %
Total hemoglobin: 17.3 g/dL — ABNORMAL HIGH (ref 12.0–16.0)
Total hemoglobin: 17.1 g/dL — ABNORMAL HIGH (ref 12.0–16.0)

## 2022-11-01 LAB — CBC
HCT: 48.9 % (ref 39.0–52.0)
Hemoglobin: 16.6 g/dL (ref 13.0–17.0)
MCH: 28.3 pg (ref 26.0–34.0)
MCHC: 33.9 g/dL (ref 30.0–36.0)
MCV: 83.4 fL (ref 80.0–100.0)
Platelets: 179 10*3/uL (ref 150–400)
RBC: 5.86 MIL/uL — ABNORMAL HIGH (ref 4.22–5.81)
RDW: 15.8 % — ABNORMAL HIGH (ref 11.5–15.5)
WBC: 11.8 10*3/uL — ABNORMAL HIGH (ref 4.0–10.5)
nRBC: 0.2 % (ref 0.0–0.2)

## 2022-11-01 LAB — MAGNESIUM: Magnesium: 2.3 mg/dL (ref 1.7–2.4)

## 2022-11-01 LAB — HEPARIN LEVEL (UNFRACTIONATED): Heparin Unfractionated: 0.46 [IU]/mL (ref 0.30–0.70)

## 2022-11-01 MED ORDER — POTASSIUM CHLORIDE CRYS ER 20 MEQ PO TBCR
60.0000 meq | EXTENDED_RELEASE_TABLET | ORAL | Status: AC
  Administered 2022-11-01 (×2): 60 meq via ORAL
  Filled 2022-11-01 (×2): qty 3

## 2022-11-01 MED ORDER — DULOXETINE HCL 20 MG PO CPEP
20.0000 mg | ORAL_CAPSULE | Freq: Every day | ORAL | Status: DC
  Administered 2022-11-01 – 2022-11-08 (×8): 20 mg via ORAL
  Filled 2022-11-01 (×8): qty 1

## 2022-11-01 MED ORDER — POTASSIUM CHLORIDE CRYS ER 20 MEQ PO TBCR
60.0000 meq | EXTENDED_RELEASE_TABLET | ORAL | Status: DC
  Administered 2022-11-01: 60 meq via ORAL
  Filled 2022-11-01: qty 3

## 2022-11-01 NOTE — Progress Notes (Signed)
ANTICOAGULATION CONSULT NOTE  Pharmacy Consult for IV Heparin Indication: atrial fibrillation  Allergies  Allergen Reactions   Bee Venom Anaphylaxis   Amiodarone Nausea Only   Spironolactone     Patient Measurements: Weight: (!) 140 kg (308 lb 10.3 oz) Heparin Dosing Weight: ~ 120 kg  Vital Signs: Temp: 97.2 F (36.2 C) (08/24 0700) Temp Source: Core (08/24 0400) BP: 109/84 (08/24 0700) Pulse Rate: 88 (08/24 0700)  Labs: Recent Labs    10/30/22 0407 10/30/22 1148 10/30/22 2310 10/31/22 0455 10/31/22 1050 10/31/22 1827 11/01/22 0402  HGB 15.9 17.3*  --  17.1*  --   --  16.6  HCT 47.7 51.0  --  49.6  --   --  48.9  PLT 184  --   --  190  --   --  179  APTT 106*  --   --   --   --   --   --   HEPARINUNFRC 0.77*  --   --  0.42  --   --  0.46  CREATININE 2.10*  --    < > 1.80* 1.89* 2.02* 1.79*   < > = values in this interval not displayed.    Estimated Creatinine Clearance: 69.7 mL/min (A) (by C-G formula based on SCr of 1.79 mg/dL (H)).  Assessment: 57 yo male on chronic Eliquis for afib.  Pharmacy asked to start IV heparin 8/19 while Eliquis is on hold in case of procedures.  Heparin level therapeutic at 0.46, CBC remains stable.  No overt bleeding or complications noted.  Goal of Therapy:  Heparin level 0.3-0.7 units/ml APTT 66-102 sec Monitor platelets by anticoagulation protocol: Yes   Plan:  Continue heparin 1800 units/h Daily heparin level and CBC  Reece Leader, Loura Back, BCPS, BCCP Clinical Pharmacist  11/01/2022 8:22 AM   [facility] pharmacy phone numbers are listed on [email or web address]

## 2022-11-01 NOTE — Plan of Care (Signed)
  Problem: Education: Goal: Knowledge of General Education information will improve Description: Including pain rating scale, medication(s)/side effects and non-pharmacologic comfort measures Outcome: Progressing   Problem: Clinical Measurements: Goal: Ability to maintain clinical measurements within normal limits will improve Outcome: Progressing   Problem: Education: Goal: Understanding of CV disease, CV risk reduction, and recovery process will improve Outcome: Progressing   Problem: Cardiovascular: Goal: Ability to achieve and maintain adequate cardiovascular perfusion will improve Outcome: Progressing

## 2022-11-01 NOTE — Progress Notes (Signed)
Advanced Heart Failure Rounding Note  PCP-Cardiologist: Armanda Magic, MD   Subjective:   Echo 8/20: EF 25-30%, RV mod reduced. IVC dilated estimated RAP ~15  RHC 8/22 with: RA 21, PA 56/30 (42), PCWP 30 v waves to 55, Fick CO/CI 5.9/2.1, TD CO/CI 4.6/1.7. PAPi 1.2 8/22. Milrinone increased to 0.5 and lasix gtt started at 25mg /hr  Interval hx:  - No events overnight.  - Telemetry NSR.  - 11L urine output.   Objective:     Weight Range: (!) 140 kg Body mass index is 36.6 kg/m.   Vital Signs:   Temp:  [96.6 F (35.9 C)-98.2 F (36.8 C)] 97.3 F (36.3 C) (08/24 1100) Pulse Rate:  [78-98] 85 (08/24 1100) Resp:  [12-26] 19 (08/24 1100) BP: (97-112)/(66-99) 105/72 (08/24 1100) SpO2:  [92 %-100 %] 95 % (08/24 1100) Weight:  [140 kg] 140 kg (08/24 0500) Last BM Date : 10/29/22  Weight change: Filed Weights   10/30/22 0500 10/31/22 0500 11/01/22 0500  Weight: (!) 148.8 kg (!) 144.4 kg (!) 140 kg    Intake/Output:   Intake/Output Summary (Last 24 hours) at 11/01/2022 1122 Last data filed at 11/01/2022 1100 Gross per 24 hour  Intake 4784.06 ml  Output 16109 ml  Net -7735.94 ml    Physical Exam  CVP 7/8 General:  well appearing.  No respiratory difficulty HEENT: normal Neck: supple. JVD ~9 cm. Carotids 2+ bilat; no bruits. No lymphadenopathy or thyromegaly appreciated. RIJ CVC, L Swann Cor: PMI nondisplaced. Regular rate & rhythm. No rubs, gallops or murmurs. Lungs: clear Abdomen: soft, nontender, nondistended. No hepatosplenomegaly. No bruits or masses. Good bowel sounds. Extremities: no cyanosis, clubbing, rash, trace BLE edema. + TED hose GU: +foley Neuro: alert & oriented x 3, cranial nerves grossly intact. moves all 4 extremities w/o difficulty. Affect pleasant.   Telemetry   NSR 80s, intt PVCs (Personally reviewed)    EKG    No new EKG to review   Labs    CBC Recent Labs    10/31/22 0455 11/01/22 0402  WBC 12.2* 11.8*  HGB 17.1* 16.6  HCT  49.6 48.9  MCV 83.5 83.4  PLT 190 179   Basic Metabolic Panel Recent Labs    60/45/40 0455 10/31/22 1050 10/31/22 1827 11/01/22 0402  NA 132*   < > 133* 134*  K 3.0*   < > 3.2* 3.2*  CL 94*   < > 93* 93*  CO2 24   < > 27 29  GLUCOSE 121*   < > 121* 138*  BUN 29*   < > 28* 26*  CREATININE 1.80*   < > 2.02* 1.79*  CALCIUM 8.6*   < > 8.5* 8.6*  MG 2.4  --   --  2.3   < > = values in this interval not displayed.    BNP: BNP (last 3 results) Recent Labs    10/01/22 1020 10/15/22 0126 10/27/22 1049  BNP 822.7* 777.2* 1,723.3*      Imaging    No results found.   Medications:     Scheduled Medications:  Chlorhexidine Gluconate Cloth  6 each Topical Daily   potassium chloride  60 mEq Oral Q3H    Infusions:  sodium chloride 10 mL/hr at 11/01/22 1100   amiodarone 60 mg/hr (11/01/22 1100)   cefTRIAXone (ROCEPHIN)  IV Stopped (10/31/22 1852)   furosemide (LASIX) 200 mg in dextrose 5 % 100 mL (2 mg/mL) infusion 15 mg/hr (11/01/22 1100)   heparin 1,800 Units/hr (  11/01/22 1100)   milrinone 0.5 mcg/kg/min (11/01/22 1100)   norepinephrine (LEVOPHED) Adult infusion      PRN Medications: acetaminophen, ondansetron (ZOFRAN) IV, mouth rinse, witch hazel-glycerin  Patient Profile   57 y/o male w/ chronic systolic heart failure due to NICM, 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   Assessment/Plan  1. Acute on Chronic Systolic Heart Failure w/ Low-Output  - Echo 2011: EF 30-35%. Cath 2011 with normal cors. - Echo 10/17: EF 35-40%. - CMRI 2017 LVEF 20%. No LGE - Echo 12/21 EF 40-45%. Coronary CTA normal cors, calcium score 0   - Cardiac PET 2023- no evidence for cardiac sarcoid or inflammatory. LVEF 38%.   - recent admit  8/24, developed CGS post DCCV. Echo EF 20-25% and RV moderately reduced - now readmitted w/ ADHF in setting of recurrent AFL w/ RVR. NYHA Class IIIB w/ low output   - on Milrinone 0.375. Co-ox 63%. C/w AKI but diuresing w/ Lasix, CVP 19 today. SCr slowing trending down  - RHC on 10/30/22 with: RA 21, PA 56/30 (42), PCWP 30 v waves to 55, Fick CO/CI 5.9/2.1, TD CO/CI 4.6/1.7. PAPi 1.2 - -11L over the past 24h; negative 24L in the past 2 days. This AM, CVP 6 with PA diastolic of 20; continues to make significant urine output. Will continue lasix gtt at 15mg /hr; stop thiazide diuretics. Continue milrinone 0.45mcg/kg/min. Can decrease lasix got 10mg /hr if urine output slows later today. Plan to start spironolactone tomorrow. Once euvolemic to hypovolemic will start weaning milrinone.  - Continue amio gtt; NSR this AM.    2. Recurrent AFL w/ RVR  - failed multiple DCCVs as outlined above. Failing PO amiodarone  - remains in NSR today - continue amio gtt until off inotropes  - heparin gtt while in ICU - TSH elevated (8.9). Free T4 1.22. Free T3 1.5 - CPAP at bedtime  - maintenance of NSR imperative. He does not tolerate AFL well. Multiple readmits for ADHF and shock.  - d/w EP, likely would not do well w/ AF ablation w/ severe BAE. Plan AVN ablation w/ Bi-V pacer this admit   3. AKI on CKD IIIB - likely cardiorenal, nonoliguric   - Recent Scr 1.8 but previously creatinine baseline ~ 1.2.  - Recent admit, SCr bumped to 2.2>>down to 1.9 at d/c  - SCr 2.35 on this admit, suspect low-output - SCr improved today at 1.79.    4. Hypokalemia - in setting of AKI  - initial K in ER 6.0, treated w/ albuterol, calcium gluconate, and insulin + Lokelma x 2 - Now hypokalemic with aggressive diuresis; discussed with PharmD.    5. OSA  - Continue CPAP nightly.   6. ? PNA -  CXR w/ focal airspace opacity in the right mid lung field, new since prior exam. WBC 14>>21>16K - PCT 0.17  - Day 5 of rocephin; can  D/C tomorrow.   7. IDA - Fe 22, T sat 8 - will need IV Fe. Will wait until infection resolves    Length of Stay: 5  Vence Lalor, DO  11/01/2022, 11:22 AM  Advanced Heart Failure Team Pager [phone] (M-F; 7a - 5p)  Please contact [facility] for night-coverage after hours (5p -7a ) and weekends on [email or web address]  CRITICAL  CARE Performed by: Dorthula Nettles   Total critical care time: 30 minutes  Critical care time was exclusive of separately billable procedures and treating other patients.  Critical care was necessary to treat or prevent imminent or life-threatening deterioration.  Critical care was time spent personally by me on the following activities: development of treatment plan with patient and/or surrogate as well as nursing, discussions with consultants, evaluation of patient's response to treatment, examination of patient, obtaining history from patient or surrogate, ordering and performing treatments and interventions, ordering and review of laboratory studies, ordering and review of radiographic studies, pulse oximetry and re-evaluation of patient's condition.

## 2022-11-01 NOTE — Plan of Care (Signed)

## 2022-11-02 DIAGNOSIS — N179 Acute kidney failure, unspecified: Secondary | ICD-10-CM | POA: Diagnosis not present

## 2022-11-02 DIAGNOSIS — E875 Hyperkalemia: Secondary | ICD-10-CM | POA: Diagnosis not present

## 2022-11-02 DIAGNOSIS — N1832 Chronic kidney disease, stage 3b: Secondary | ICD-10-CM | POA: Diagnosis not present

## 2022-11-02 DIAGNOSIS — I5023 Acute on chronic systolic (congestive) heart failure: Secondary | ICD-10-CM | POA: Diagnosis not present

## 2022-11-02 LAB — COMPREHENSIVE METABOLIC PANEL
ALT: 155 U/L — ABNORMAL HIGH (ref 0–44)
AST: 56 U/L — ABNORMAL HIGH (ref 15–41)
Albumin: 3 g/dL — ABNORMAL LOW (ref 3.5–5.0)
Alkaline Phosphatase: 66 U/L (ref 38–126)
Anion gap: 13 (ref 5–15)
BUN: 25 mg/dL — ABNORMAL HIGH (ref 6–20)
CO2: 29 mmol/L (ref 22–32)
Calcium: 8.5 mg/dL — ABNORMAL LOW (ref 8.9–10.3)
Chloride: 91 mmol/L — ABNORMAL LOW (ref 98–111)
Creatinine, Ser: 1.87 mg/dL — ABNORMAL HIGH (ref 0.61–1.24)
GFR, Estimated: 41 mL/min — ABNORMAL LOW (ref >60)
Glucose, Bld: 224 mg/dL — ABNORMAL HIGH (ref 70–99)
Potassium: 3.6 mmol/L (ref 3.5–5.1)
Sodium: 133 mmol/L — ABNORMAL LOW (ref 135–145)
Total Bilirubin: 1 mg/dL (ref 0.3–1.2)
Total Protein: 6 g/dL — ABNORMAL LOW (ref 6.5–8.1)

## 2022-11-02 LAB — COOXEMETRY PANEL
Carboxyhemoglobin: 2.1 % — ABNORMAL HIGH (ref 0.5–1.5)
Methemoglobin: 0.8 % (ref 0.0–1.5)
O2 Saturation: 71.4 %
Total hemoglobin: 17.6 g/dL — ABNORMAL HIGH (ref 12.0–16.0)

## 2022-11-02 LAB — CBC
HCT: 49.5 % (ref 39.0–52.0)
Hemoglobin: 16.9 g/dL (ref 13.0–17.0)
MCH: 28.6 pg (ref 26.0–34.0)
MCHC: 34.1 g/dL (ref 30.0–36.0)
MCV: 83.8 fL (ref 80.0–100.0)
Platelets: 167 10*3/uL (ref 150–400)
RBC: 5.91 MIL/uL — ABNORMAL HIGH (ref 4.22–5.81)
RDW: 15.7 % — ABNORMAL HIGH (ref 11.5–15.5)
WBC: 12.4 10*3/uL — ABNORMAL HIGH (ref 4.0–10.5)
nRBC: 0.2 % (ref 0.0–0.2)

## 2022-11-02 LAB — HEPARIN LEVEL (UNFRACTIONATED): Heparin Unfractionated: 0.68 [IU]/mL (ref 0.30–0.70)

## 2022-11-02 MED ORDER — POTASSIUM CHLORIDE CRYS ER 20 MEQ PO TBCR
60.0000 meq | EXTENDED_RELEASE_TABLET | Freq: Once | ORAL | Status: AC
  Administered 2022-11-02: 60 meq via ORAL
  Filled 2022-11-02: qty 3

## 2022-11-02 MED ORDER — POTASSIUM CHLORIDE CRYS ER 20 MEQ PO TBCR
40.0000 meq | EXTENDED_RELEASE_TABLET | Freq: Two times a day (BID) | ORAL | Status: DC
  Administered 2022-11-02: 40 meq via ORAL
  Filled 2022-11-02: qty 2

## 2022-11-02 NOTE — Plan of Care (Signed)
  Problem: Education: Goal: Knowledge of General Education information will improve Description: Including pain rating scale, medication(s)/side effects and non-pharmacologic comfort measures Outcome: Progressing   Problem: Clinical Measurements: Goal: Ability to maintain clinical measurements within normal limits will improve Outcome: Progressing   Problem: Safety: Goal: Ability to remain free from injury will improve Outcome: Progressing   Problem: Education: Goal: Understanding of CV disease, CV risk reduction, and recovery process will improve Outcome: Progressing   Problem: Cardiovascular: Goal: Ability to achieve and maintain adequate cardiovascular perfusion will improve Outcome: Progressing

## 2022-11-02 NOTE — Progress Notes (Signed)
ANTICOAGULATION CONSULT NOTE  Pharmacy Consult for IV Heparin Indication: atrial fibrillation  Allergies  Allergen Reactions   Bee Venom Anaphylaxis   Amiodarone Nausea Only   Spironolactone     Patient Measurements: Weight: (!) 137.7 kg (303 lb 9.2 oz) Heparin Dosing Weight: ~ 120 kg  Vital Signs: Temp: 97 F (36.1 C) (08/25 0900) BP: 108/83 (08/25 0900) Pulse Rate: 82 (08/25 0900)  Labs: Recent Labs    10/31/22 0455 10/31/22 1050 11/01/22 0402 11/01/22 1400 11/02/22 0304  HGB 17.1*  --  16.6  --  16.9  HCT 49.6  --  48.9  --  49.5  PLT 190  --  179  --  167  HEPARINUNFRC 0.42  --  0.46  --  0.68  CREATININE 1.80*   < > 1.79* 1.71* 1.87*   < > = values in this interval not displayed.    Estimated Creatinine Clearance: 66.1 mL/min (A) (by C-G formula based on SCr of 1.87 mg/dL (H)).  Assessment: 57 yo male on chronic Eliquis for afib.  Pharmacy asked to start IV heparin 8/19 while Eliquis is on hold in case of procedures.  Heparin level therapeutic at 0.68, CBC remains stable.  No overt bleeding or complications noted.  Goal of Therapy:  Heparin level 0.3-0.7 units/ml APTT 66-102 sec Monitor platelets by anticoagulation protocol: Yes   Plan:  Continue heparin 1800 units/h Daily heparin level and CBC  Reece Leader, Loura Back, BCPS, [facility] Clinical Pharmacist  11/02/2022 10:44 AM   [facility] pharmacy phone numbers are listed on [email or web address]

## 2022-11-02 NOTE — Progress Notes (Signed)
Advanced Heart Failure Rounding Note  PCP-Cardiologist: Armanda Magic, MD   Subjective:   Echo 8/20: EF 25-30%, RV mod reduced. IVC dilated estimated RAP ~15  RHC 8/22 with: RA 21, PA 56/30 (42), PCWP 30 v waves to 55, Fick CO/CI 5.9/2.1, TD CO/CI 4.6/1.7. PAPi 1.2 8/22. Milrinone increased to 0.5 and lasix gtt started at 25mg /hr  Interval hx:  - No events overnight.  - NSR overnight in the 80s-90s - 8L urine output overnight.   Objective:     Weight Range: (!) 137.7 kg Body mass index is 36 kg/m.   Vital Signs:   Temp:  [96.4 F (35.8 C)-98.2 F (36.8 C)] 97 F (36.1 C) (08/25 0900) Pulse Rate:  [82-98] 82 (08/25 0900) Resp:  [10-22] 19 (08/25 0900) BP: (92-128)/(69-100) 108/83 (08/25 0900) SpO2:  [84 %-98 %] 94 % (08/25 0900) FiO2 (%):  [21 %] 21 % (08/24 2345) Weight:  [137.7 kg] 137.7 kg (08/25 0600) Last BM Date : 10/29/22  Weight change: Filed Weights   10/31/22 0500 11/01/22 0500 11/02/22 0600  Weight: (!) 144.4 kg (!) 140 kg (!) 137.7 kg    Intake/Output:   Intake/Output Summary (Last 24 hours) at 11/02/2022 1040 Last data filed at 11/02/2022 1032 Gross per 24 hour  Intake 3634.64 ml  Output 7875 ml  Net -4240.36 ml    Physical Exam  CVP 7 General:  well appearing.  No respiratory difficulty HEENT: normal Neck: supple. JVD ~7-8 cm. Carotids 2+ bilat; no bruits. No lymphadenopathy or thyromegaly appreciated., L Swann Cor: PMI nondisplaced. Regular rate & rhythm. No rubs, gallops or murmurs. Lungs: clear Abdomen: soft, nontender, nondistended. No hepatosplenomegaly. No bruits or masses. Good bowel sounds. Extremities: no cyanosis, clubbing, rash, trace BLE edema. + TED hose GU: +foley Neuro: alert & oriented x 3, cranial nerves grossly intact. moves all 4 extremities w/o difficulty. Affect pleasant.   Telemetry   NSR 80s, intt PVCs (Personally reviewed)    EKG    No new EKG to review   Labs    CBC Recent Labs    11/01/22 0402  11/02/22 0304  WBC 11.8* 12.4*  HGB 16.6 16.9  HCT 48.9 49.5  MCV 83.4 83.8  PLT 179 167   Basic Metabolic Panel Recent Labs    16/10/96 0455 10/31/22 1050 11/01/22 0402 11/01/22 1400 11/02/22 0304  NA 132*   < > 134* 132* 133*  K 3.0*   < > 3.2* 3.3* 3.6  CL 94*   < > 93* 94* 91*  CO2 24   < > 29 25 29   GLUCOSE 121*   < > 138* 183* 224*  BUN 29*   < > 26* 26* 25*  CREATININE 1.80*   < > 1.79* 1.71* 1.87*  CALCIUM 8.6*   < > 8.6* 7.8* 8.5*  MG 2.4  --  2.3  --   --    < > = values in this interval not displayed.    BNP: BNP (last 3 results) Recent Labs    10/01/22 1020 10/15/22 0126 10/27/22 1049  BNP 822.7* 777.2* 1,723.3*      Imaging    No results found.   Medications:     Scheduled Medications:  Chlorhexidine Gluconate Cloth  6 each Topical Daily   DULoxetine  20 mg Oral Daily    Infusions:  sodium chloride Stopped (11/01/22 1157)   amiodarone 60 mg/hr (11/02/22 1012)   cefTRIAXone (ROCEPHIN)  IV Stopped (11/01/22 1800)   furosemide (LASIX)  200 mg in dextrose 5 % 100 mL (2 mg/mL) infusion 8 mg/hr (11/02/22 1036)   heparin 1,800 Units/hr (11/02/22 0900)   milrinone 0.5 mcg/kg/min (11/02/22 0930)   norepinephrine (LEVOPHED) Adult infusion      PRN Medications: acetaminophen, ondansetron (ZOFRAN) IV, mouth rinse, witch hazel-glycerin  Patient Profile   57 y/o male w/ chronic systolic heart failure due to NICM, 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   Assessment/Plan  1. Acute on Chronic Systolic Heart Failure w/ Low-Output  - Echo 2011: EF 30-35%. Cath 2011 with normal cors. - Echo 10/17: EF 35-40%. - CMRI 2017 LVEF 20%. No LGE - Echo 12/21 EF 40-45%. Coronary CTA normal cors, calcium score 0    - Cardiac PET 2023- no evidence for cardiac sarcoid or inflammatory. LVEF 38%.   - recent admit 8/24, developed CGS post DCCV. Echo EF 20-25% and RV moderately reduced - now readmitted w/ ADHF in setting of recurrent AFL w/ RVR. NYHA Class IIIB w/ low output   - on Milrinone 0.375. Co-ox 63%. C/w AKI but diuresing w/ Lasix, CVP 19 today. SCr slowing trending down  - RHC on 10/30/22 with: RA 21, PA 56/30 (42), PCWP 30 v waves to 55, Fick CO/CI 5.9/2.1, TD CO/CI 4.6/1.7. PAPi 1.2 - Now roughly 30L of urine output in the past 3 days; CVP down to 7 today. Will wean lasix to 8mg /hr; mixed venous stable in the 70s on milrinone 0.67mcg/kg/min.  - Place PICC line - Will D/C PA catheter once PICC is placed.  - Transition to intermittent dosing diuretics tomorrow (?torsemide) with plan to start weaning milrinone tomorrow. Continue amio gtt at 60mg /hr until milrinone is weaned off. Plan for 20-30g load.  - Continue amio gtt; NSR this AM.    2. Recurrent AFL w/ RVR  - failed multiple DCCVs as outlined above. Failing PO amiodarone  - remains in NSR today - continue amio gtt until off inotropes  - heparin gtt while in ICU - TSH elevated (8.9). Free T4 1.22. Free T3 1.5 - CPAP at bedtime  - maintenance of NSR imperative. He does not tolerate AFL well. Multiple readmits for ADHF and shock.   3. AKI on CKD IIIB - likely cardiorenal, nonoliguric   - Recent Scr 1.8 but previously creatinine baseline ~ 1.2.  - Recent admit, SCr bumped to 2.2>>down to 1.9 at d/c  - SCr 2.35 on this admit, suspect low-output - Rise in sCr today likely as he is approaching euvolemia; will wean down lasix gtt. He still has 1+ edema in the thighs.    4. Hypokalemia - in setting of AKI  - initial K in ER 6.0, treated w/ albuterol, calcium gluconate, and insulin + Lokelma x 2 - Now hypokalemic with aggressive diuresis; discussed with PharmD.    5. OSA  - Continue CPAP nightly.   6. ? PNA -  CXR w/ focal airspace opacity in  the right mid lung field, new since prior exam. WBC 14>>21>16K - PCT 0.17  - Day 5 of rocephin; can D/C tomorrow.   7. IDA - Fe 22, T sat 8 - will need IV Fe. Will wait until infection resolves    Length of Stay: 6  Nicolaus Andel, DO  11/02/2022, 10:40 AM  Advanced Heart  Failure Team Pager [phone] (M-F; 7a - 5p)  Please contact [facility] for night-coverage after hours (5p -7a ) and weekends on [email or web address]  CRITICAL CARE Performed by: Dorthula Nettles   Total critical care time: 30 minutes  Critical care time was exclusive of separately billable procedures and treating other patients.  Critical care was necessary to treat or prevent imminent or life-threatening deterioration.  Critical care was time spent personally by me on the following activities: development of treatment plan with patient and/or surrogate as well as nursing, discussions with consultants, evaluation of patient's response to treatment, examination of patient, obtaining history from patient or surrogate, ordering and performing treatments and interventions, ordering and review of laboratory studies, ordering and review of radiographic studies, pulse oximetry and re-evaluation of patient's condition.

## 2022-11-02 NOTE — Progress Notes (Signed)
  Patient Name: Harold Park Date of Encounter: 11/03/2022   Primary Cardiologist: Armanda Magic, MD Electrophysiologist: Dr. Nelly Laurence  Interval Summary   RHC 8/22 with: RA 21, PA 56/30 (42), PCWP 30 v waves to 55, Fick CO/CI 5.9/2.1, TD CO/CI 4.6/1.7. PAPi 1.2   Diuresed with milrinone support through weekend.  Overall down > 20 lbs.  CVP 9-10 today.   No new concerns this am.   Vital Signs    Vitals:   11/03/22 0400 11/03/22 0402 11/03/22 0500 11/03/22 0600  BP: 96/76  106/78 111/87  Pulse: 85 83 84 82  Resp: 10 15 16 13   Temp: (!) 97.2 F (36.2 C) (!) 97.2 F (36.2 C) (!) 97.2 F (36.2 C) (!) 96.6 F (35.9 C)  TempSrc: Core     SpO2: 96% 96% 93% 96%  Weight:   (!) 137.5 kg     Intake/Output Summary (Last 24 hours) at 11/03/2022 0738 Last data filed at 11/03/2022 9562 Gross per 24 hour  Intake 3272.16 ml  Output 3250 ml  Net 22.16 ml   Filed Weights   11/01/22 0500 11/02/22 0600 11/03/22 0500  Weight: (!) 140 kg (!) 137.7 kg (!) 137.5 kg    Physical Exam    GEN- The patient is well appearing, alert and oriented x 3 today.   Lungs- Clear to ausculation bilaterally, normal work of breathing Cardiac- Regular rate and rhythm, no murmurs, rubs or gallops GI- soft, NT, ND, + BS Extremities- no clubbing or cyanosis. No edema  Telemetry    NSR 80-90s (personally reviewed)  Hospital Course    57 y.o. male NICM, PVCs (remotely by monitor w/burden of 6.3% in 2017), OSA w/CPAP, pulmonary sarcoid, p.HTN, HTN, AFib/aflutter admitted with Acute/chronic CHF and AFlutter w/RVR   Assessment & Plan    Persistent AFib AFlutter (typical and atypical) CHA2DS2Vasc is 2, on Eliquis outpt >>> heparin  Continue IV amiodarone as long as on milrinone.  Previously PO amiodarone very poorly tolerated With severe atriopathy, low likelihood of maintaining SR Tentative working plan right now would be to pursue AVNode ablation and likely a CRT-D implant once clinically ready from a  HF perspective    Acute on chronic systolic CHF NICM Volume status improving, transitioning to torsemide today Overall down > 20 lbs Weaning milrinone as tolerated, to 0.375 today per HF note.  PVCs Continue amiodarone BB once can tolerate.  For questions or updates, please contact [facility] Please consult [email or web address] for contact info under Cardiology/STEMI.  Signed, Graciella Freer, PA-C  11/03/2022, 7:38 AM

## 2022-11-02 NOTE — Plan of Care (Signed)

## 2022-11-03 ENCOUNTER — Other Ambulatory Visit: Payer: Self-pay

## 2022-11-03 ENCOUNTER — Inpatient Hospital Stay (HOSPITAL_COMMUNITY): Payer: BC Managed Care – PPO

## 2022-11-03 DIAGNOSIS — E875 Hyperkalemia: Secondary | ICD-10-CM | POA: Diagnosis not present

## 2022-11-03 DIAGNOSIS — I483 Typical atrial flutter: Secondary | ICD-10-CM | POA: Diagnosis not present

## 2022-11-03 DIAGNOSIS — I5023 Acute on chronic systolic (congestive) heart failure: Secondary | ICD-10-CM | POA: Diagnosis not present

## 2022-11-03 DIAGNOSIS — I4891 Unspecified atrial fibrillation: Secondary | ICD-10-CM | POA: Diagnosis not present

## 2022-11-03 DIAGNOSIS — I484 Atypical atrial flutter: Secondary | ICD-10-CM | POA: Diagnosis not present

## 2022-11-03 LAB — COOXEMETRY PANEL
Carboxyhemoglobin: 2.1 % — ABNORMAL HIGH (ref 0.5–1.5)
Carboxyhemoglobin: 1.7 % — ABNORMAL HIGH (ref 0.5–1.5)
Carboxyhemoglobin: 1.5 % (ref 0.5–1.5)
Methemoglobin: 0.8 % (ref 0.0–1.5)
Methemoglobin: 0.7 % (ref 0.0–1.5)
Methemoglobin: <0.7 % (ref 0.0–1.5)
O2 Saturation: 84.7 %
O2 Saturation: 72.8 %
O2 Saturation: 67 %
Total hemoglobin: 17.4 g/dL — ABNORMAL HIGH (ref 12.0–16.0)
Total hemoglobin: 17.1 g/dL — ABNORMAL HIGH (ref 12.0–16.0)
Total hemoglobin: 17.1 g/dL — ABNORMAL HIGH (ref 12.0–16.0)

## 2022-11-03 LAB — BASIC METABOLIC PANEL
Anion gap: 10 (ref 5–15)
BUN: 22 mg/dL — ABNORMAL HIGH (ref 6–20)
CO2: 28 mmol/L (ref 22–32)
Calcium: 8.4 mg/dL — ABNORMAL LOW (ref 8.9–10.3)
Chloride: 91 mmol/L — ABNORMAL LOW (ref 98–111)
Creatinine, Ser: 1.74 mg/dL — ABNORMAL HIGH (ref 0.61–1.24)
GFR, Estimated: 45 mL/min — ABNORMAL LOW (ref >60)
Glucose, Bld: 123 mg/dL — ABNORMAL HIGH (ref 70–99)
Potassium: 3.4 mmol/L — ABNORMAL LOW (ref 3.5–5.1)
Sodium: 129 mmol/L — ABNORMAL LOW (ref 135–145)

## 2022-11-03 LAB — CBC
HCT: 50 % (ref 39.0–52.0)
Hemoglobin: 17 g/dL (ref 13.0–17.0)
MCH: 29.1 pg (ref 26.0–34.0)
MCHC: 34 g/dL (ref 30.0–36.0)
MCV: 85.5 fL (ref 80.0–100.0)
Platelets: 142 10*3/uL — ABNORMAL LOW (ref 150–400)
RBC: 5.85 MIL/uL — ABNORMAL HIGH (ref 4.22–5.81)
RDW: 15.6 % — ABNORMAL HIGH (ref 11.5–15.5)
WBC: 11.3 10*3/uL — ABNORMAL HIGH (ref 4.0–10.5)
nRBC: 0.2 % (ref 0.0–0.2)

## 2022-11-03 LAB — HEPARIN LEVEL (UNFRACTIONATED): Heparin Unfractionated: 0.6 [IU]/mL (ref 0.30–0.70)

## 2022-11-03 MED ORDER — TORSEMIDE 20 MG PO TABS
60.0000 mg | ORAL_TABLET | Freq: Every day | ORAL | Status: DC
  Administered 2022-11-03: 60 mg via ORAL
  Filled 2022-11-03: qty 3

## 2022-11-03 MED ORDER — SODIUM CHLORIDE 0.9% FLUSH: 10.0000 mL | INTRAVENOUS | Status: DC | PRN

## 2022-11-03 MED ORDER — DIGOXIN 125 MCG PO TABS
0.1250 mg | ORAL_TABLET | Freq: Every day | ORAL | Status: DC
  Administered 2022-11-03 – 2022-11-08 (×6): 0.125 mg via ORAL
  Filled 2022-11-03 (×6): qty 1

## 2022-11-03 MED ORDER — SODIUM CHLORIDE 0.9% FLUSH
10.0000 mL | Freq: Two times a day (BID) | INTRAVENOUS | Status: DC
  Administered 2022-11-03 – 2022-11-05 (×4): 10 mL
  Administered 2022-11-05: 30 mL
  Administered 2022-11-06 – 2022-11-08 (×5): 10 mL

## 2022-11-03 MED ORDER — POTASSIUM CHLORIDE CRYS ER 20 MEQ PO TBCR
60.0000 meq | EXTENDED_RELEASE_TABLET | ORAL | Status: AC
  Administered 2022-11-03 (×3): 60 meq via ORAL
  Filled 2022-11-03 (×3): qty 3

## 2022-11-03 MED ORDER — DOCUSATE SODIUM 100 MG PO CAPS
100.0000 mg | ORAL_CAPSULE | Freq: Every day | ORAL | Status: DC
  Administered 2022-11-03 – 2022-11-08 (×6): 100 mg via ORAL
  Filled 2022-11-03 (×6): qty 1

## 2022-11-03 MED ORDER — TORSEMIDE 20 MG PO TABS
40.0000 mg | ORAL_TABLET | Freq: Every evening | ORAL | Status: DC
  Administered 2022-11-03: 40 mg via ORAL
  Filled 2022-11-03: qty 2

## 2022-11-03 NOTE — Progress Notes (Incomplete)
  Patient Name: Harold Park Date of Encounter: 11/04/2022  Primary Cardiologist: Armanda Magic, MD Electrophysiologist: Dr. Nelly Laurence  Interval Summary   Feeling OK this am. Still hopes for AF/AFL ablation as primary therapy  Vital Signs    Vitals:   11/04/22 0900 11/04/22 0915 11/04/22 0930 11/04/22 0945  BP: 108/78     Pulse: 79 79 79 80  Resp: 14 16 18 17   Temp:      TempSrc:      SpO2: 97% 96% 96% 97%  Weight:        Intake/Output Summary (Last 24 hours) at 11/04/2022 0947 Last data filed at 11/04/2022 0900 Gross per 24 hour  Intake 2211.71 ml  Output 1840 ml  Net 371.71 ml   Filed Weights   11/02/22 0600 11/03/22 0500 11/04/22 0500  Weight: (!) 137.7 kg (!) 137.5 kg (!) 141.5 kg    Physical Exam    GEN- The patient is well appearing, alert and oriented x 3 today.   Lungs- Clear to ausculation bilaterally, normal work of breathing Cardiac- Regular rate and rhythm, no murmurs, rubs or gallops GI- soft, NT, ND, + BS Extremities- no clubbing or cyanosis. No edema  Telemetry    NSR in 64s (personally reviewed)  Hospital Course    57 y.o. male NICM, PVCs (remotely by monitor w/burden of 6.3% in 2017), OSA w/CPAP, pulmonary sarcoid, p.HTN, HTN, AFib/aflutter admitted with Acute/chronic CHF and AFlutter w/RVR   Assessment & Plan    Persistent AFib AFlutter (typical and atypical) CHA2DS2Vasc is 2, on Eliquis outpt >>> heparin  Continue IV amiodarone as long as on milrinone.  Previously PO amiodarone very poorly tolerated, Hopefully with larger IV load can use lower po dose and tolerate.  With severe atriopathy, low likelihood of maintaining SR Pt wishes to try and improve to the point he will be an AF/AFL ablation candidate prior to considering AV nodal ablation and CRT implant.   He has been told he is likely to have more AF/AFL and potentially A/C CHF in that setting.    Acute on chronic systolic CHF NICM Volume status improving, transitioning to torsemide  today Overall down > 20 lbs Weaning milrinone as tolerated, to 0.25 today per HF note.    PVCs Continue amiodarone Consider BB once can tolerate.  Pt remains adamant that he wishes to avoid AV nodal ablation and CRT at this time.   EP will follow from a distance; Would continue IV amiodarone while on milrinone. If he again fails to tolerate po amiodarone, or has more poorly tolerates AF/AFL we are happy to revisit AV nodal ablation / CRT.    Otherwise, will follow up as an outpatient to plan fib ablation, with a goal of adding him on in the next couple of months if a spot opens up.    For questions or updates, please contact [facility] Please consult [email or web address] for contact info under Cardiology/STEMI.  Signed, Graciella Freer, PA-C  11/04/2022, 9:47 AM

## 2022-11-03 NOTE — Plan of Care (Signed)

## 2022-11-03 NOTE — Progress Notes (Signed)
ANTICOAGULATION CONSULT NOTE  Pharmacy Consult for IV Heparin Indication: atrial fibrillation  Allergies  Allergen Reactions   Bee Venom Anaphylaxis   Amiodarone Nausea Only   Spironolactone     Patient Measurements: Weight: (!) 137.5 kg (303 lb 2.1 oz) Heparin Dosing Weight: ~ 120 kg  Vital Signs: Temp: 96.6 F (35.9 C) (08/26 0600) Temp Source: Core (08/26 0400) BP: 111/87 (08/26 0600) Pulse Rate: 82 (08/26 0600)  Labs: Recent Labs    11/01/22 0402 11/01/22 1400 11/02/22 0304 11/03/22 0359 11/03/22 0540  HGB 16.6  --  16.9 17.0  --   HCT 48.9  --  49.5 50.0  --   PLT 179  --  167 142*  --   HEPARINUNFRC 0.46  --  0.68 0.60  --   CREATININE 1.79* 1.71* 1.87*  --  1.74*    Estimated Creatinine Clearance: 71 mL/min (A) (by C-G formula based on SCr of 1.74 mg/dL (H)).  Assessment: 57 yo male on chronic Eliquis for afib.  Pharmacy asked to start IV heparin 8/19 while Eliquis is on hold in case of procedures.  Heparin level therapeutic at 0.6, CBC remains stable.  No overt bleeding or complications noted.  Goal of Therapy:  Heparin level 0.3-0.7 units/ml APTT 66-102 sec Monitor platelets by anticoagulation protocol: Yes   Plan:  Continue heparin 1800 units/h Daily heparin level and CBC  Fredonia Highland, PharmD, [place], [facility] Clinical Pharmacist [phone] Please check AMION for all [facility] Pharmacy numbers 11/03/2022

## 2022-11-03 NOTE — Progress Notes (Addendum)
Advanced Heart Failure Rounding Note  PCP-Cardiologist: Armanda Magic, MD   Subjective:   Echo 8/20: EF 25-30%, RV mod reduced. IVC dilated estimated RAP ~15  RHC 8/22 with: RA 21, PA 56/30 (42), PCWP 30 v waves to 55, Fick CO/CI 5.9/2.1, TD CO/CI 4.6/1.7. PAPi 1.2 8/22. Milrinone increased to 0.5 and lasix gtt started at 25mg /hr  Remains in NSR.   SCr over the weekend 1.71>1.87>1.74. Lasix gtt weaned down to 8/hr. -3.274mL. Weight stable, overall down 25lbs.   Feels fine. Resting in bed.   Swann #s PA 44/28 (36) CO 4.76 CI 1.73  Objective:     Weight Range: (!) 137.5 kg Body mass index is 35.95 kg/m.   Vital Signs:   Temp:  [96.6 F (35.9 C)-97.7 F (36.5 C)] 96.6 F (35.9 C) (08/26 0600) Pulse Rate:  [82-90] 82 (08/26 0600) Resp:  [10-23] 13 (08/26 0600) BP: (94-123)/(62-95) 111/87 (08/26 0600) SpO2:  [91 %-99 %] 96 % (08/26 0600) FiO2 (%):  [21 %] 21 % (08/26 0010) Weight:  [137.5 kg] 137.5 kg (08/26 0500) Last BM Date : 10/29/22  Weight change: Filed Weights   11/01/22 0500 11/02/22 0600 11/03/22 0500  Weight: (!) 140 kg (!) 137.7 kg (!) 137.5 kg    Intake/Output:   Intake/Output Summary (Last 24 hours) at 11/03/2022 0717 Last data filed at 11/03/2022 0659 Gross per 24 hour  Intake 3272.16 ml  Output 3250 ml  Net 22.16 ml    Physical Exam  CVP 9/10 General:  well appearing.  No respiratory difficulty HEENT: normal Neck: supple. JVD ~9 cm. Carotids 2+ bilat; no bruits. No lymphadenopathy or thyromegaly appreciated. Teddy Spike Cor: PMI nondisplaced. Regular rate & rhythm. No rubs, gallops or murmurs. Lungs: clear Abdomen: soft, nontender, nondistended. No hepatosplenomegaly. No bruits or masses. Good bowel sounds. Extremities: no cyanosis, clubbing, rash, +1 ankle edema  GU: +foley Neuro: alert & oriented x 3, cranial nerves grossly intact. moves all 4 extremities w/o difficulty. Affect pleasant.   Telemetry   NSR 80s, intt PVCs. 11 beat NSVT  (Personally reviewed)    EKG    No new EKG to review   Labs    CBC Recent Labs    11/02/22 0304 11/03/22 0359  WBC 12.4* 11.3*  HGB 16.9 17.0  HCT 49.5 50.0  MCV 83.8 85.5  PLT 167 142*   Basic Metabolic Panel Recent Labs    16/10/96 0402 11/01/22 1400 11/02/22 0304 11/03/22 0540  NA 134*   < > 133* 129*  K 3.2*   < > 3.6 3.4*  CL 93*   < > 91* 91*  CO2 29   < > 29 28  GLUCOSE 138*   < > 224* 123*  BUN 26*   < > 25* 22*  CREATININE 1.79*   < > 1.87* 1.74*  CALCIUM 8.6*   < > 8.5* 8.4*  MG 2.3  --   --   --    < > = values in this interval not displayed.    BNP: BNP (last 3 results) Recent Labs    10/01/22 1020 10/15/22 0126 10/27/22 1049  BNP 822.7* 777.2* 1,723.3*      Imaging    No results found.   Medications:     Scheduled Medications:  Chlorhexidine Gluconate Cloth  6 each Topical Daily   DULoxetine  20 mg Oral Daily   potassium chloride  40 mEq Oral BID    Infusions:  sodium chloride Stopped (11/01/22 1157)  amiodarone 60 mg/hr (11/03/22 0600)   furosemide (LASIX) 200 mg in dextrose 5 % 100 mL (2 mg/mL) infusion 8 mg/hr (11/03/22 0600)   heparin 1,800 Units/hr (11/03/22 0600)   milrinone 0.5 mcg/kg/min (11/03/22 0600)   norepinephrine (LEVOPHED) Adult infusion      PRN Medications: acetaminophen, ondansetron (ZOFRAN) IV, mouth rinse, witch hazel-glycerin  Patient Profile   57 y/o male w/ chronic systolic heart failure due to NICM, 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   Assessment/Plan  1. Acute on Chronic Systolic Heart Failure w/ Low-Output  - Echo 2011: EF 30-35%. Cath 2011 with normal cors. - Echo 10/17: EF 35-40%. - CMRI 2017 LVEF 20%. No LGE - Echo 12/21 EF 40-45%.  Coronary CTA normal cors, calcium score 0   - Cardiac PET 2023- no evidence for cardiac sarcoid or inflammatory. LVEF 38%.   - recent admit 8/24, developed CGS post DCCV. Echo EF 20-25% and RV moderately reduced - now readmitted w/ ADHF in setting of recurrent AFL w/ RVR. NYHA Class IIIB w/ low output   - RHC on 10/30/22 with: RA 21, PA 56/30 (42), PCWP 30 v waves to 55, Fick CO/CI 5.9/2.1, TD CO/CI 4.6/1.7. PAPi 1.2 - Down 25lbs; CVP 9/10 today. Co-ox 85% on milrinone 0.42mcg/kg/min. Stop lasix gtt. Transition to Torsemide 60am/40pm.  - Place PICC line. Will D/C PA catheter once PICC is placed.  - On milrinone 0.5, wean down to 0.375 today. Continue amio gtt at 60mg /hr until milrinone is weaned off. Plan for 20-30g load.    2. Recurrent AFL w/ RVR  - failed multiple DCCVs as outlined above. Failing PO amiodarone  - continue amio gtt until off inotropes, remains in NSR today - heparin gtt while in ICU - TSH elevated (8.9). Free T4 1.22. Free T3 1.5 - CPAP at bedtime  - maintenance of NSR imperative. He does not tolerate AFL well. Multiple readmits for ADHF and shock.   3. AKI on CKD IIIB - likely cardiorenal, nonoliguric   - Previously creatinine baseline ~ 1.2. 1.74 today - Recent admit, SCr bumped to 2.2>>down to 1.9 at d/c  - SCr 2.35 on this admit, suspect low-output   4. Hypokalemia - in setting of AKI  - initial K in ER 6.0, treated w/ albuterol, calcium gluconate, and insulin + Lokelma x 2 - Now hypokalemic with aggressive diuresis; discussed with PharmD.    5. OSA  - Continue CPAP nightly.   6. ? PNA -  CXR w/ focal airspace opacity in the right mid lung field, new since prior exam. WBC 14>>21>16>11.3K - PCT 0.17  - Completed rocephin 8/25  7. IDA - Fe 22, T sat 8 - will need IV Fe. Will wait until infection resolves    Length of Stay: 7  Alen Bleacher, NP  11/03/2022, 7:17 AM  Advanced Heart Failure Team Pager [phone] (M-F; 7a - 5p)  Please contact [facility]  for night-coverage after hours (5p -7a ) and weekends on [email or web address]  Patient seen with NP, agree with the above note.   Co-ox 85%, CI 2.25 when repeated this morning.  CVP 8-9 for me, he is on Lasix gtt 8 mg/hr.  He remains on milrinone 0.5.   On amiodarone gtt 60 mg/hr, in NSR.  General: NAD Neck: JVP 8 cm, no thyromegaly or thyroid nodule.  Lungs: Clear to auscultation bilaterally with normal respiratory effort. CV: Nondisplaced PMI.  Heart regular S1/S2, no S3/S4, no murmur.  Trace ankle edema.  Abdomen: Soft, nontender, no hepatosplenomegaly, no distention.  Skin: Intact without lesions or rashes.  Neurologic: Alert and oriented x 3.  Psych: Normal affect. Extremities: No clubbing or cyanosis.  HEENT: Normal.   Will start slowly weaning milrinone, down to 0.375 today and will add digoxin with creatinine lower at 1.74.   CVP 8-9, will stop Lasix gtt and start torsemide 60 qam/40 qpm.   He remains in NSR on amiodarone gtt.  In the past, had trouble with amiodarone due to nausea with pills but so far doing fine with IV.  Will continue amiodarone 60 mg/hr for now for aggressive load, when off milrinone gtt will try po amiodarone again. Ideally, would get to AF/AFL ablation.  If we cannot control AF/AFL or he cannot take amiodarone long-term, may have to consider AV nodal ablation-CRT, but currently holding NSR.   CRITICAL CARE Performed by: Marca Ancona  Total critical care time: 40 minutes  Critical care time was exclusive of separately billable procedures and treating other patients.  Critical care was necessary to treat or prevent imminent or life-threatening deterioration.  Critical care was time spent personally by me on the following activities: development of treatment plan with patient and/or surrogate as well as nursing, discussions with consultants, evaluation of patient's response to treatment, examination of patient, obtaining history from patient or surrogate, ordering  and performing treatments and interventions, ordering and review of laboratory studies, ordering and review of radiographic studies, pulse oximetry and re-evaluation of patient's condition.  Marca Ancona 11/03/2022 9:28 AM

## 2022-11-03 NOTE — Progress Notes (Signed)
Peripherally Inserted Central Catheter Placement  The IV Nurse has discussed with the patient and/or persons authorized to consent for the patient, the purpose of this procedure and the potential benefits and risks involved with this procedure.  The benefits include less needle sticks, lab draws from the catheter, and the patient may be discharged home with the catheter. Risks include, but not limited to, infection, bleeding, blood clot (thrombus formation), and puncture of an artery; nerve damage and irregular heartbeat and possibility to perform a PICC exchange if needed/ordered by physician.  Alternatives to this procedure were also discussed.  Bard Power PICC patient education guide, fact sheet on infection prevention and patient information card has been provided to patient /or left at bedside.  PICC very difficult to drop into SVC due to left side swan ganz.  PICC Placement Documentation  PICC Triple Lumen 11/03/22 Right Basilic 43 cm 0 cm (Active)  Indication for Insertion or Continuance of Line Vasoactive infusions 11/03/22 2241  Exposed Catheter (cm) 0 cm 11/03/22 2241  Site Assessment Clean, Dry, Intact 11/03/22 2241  Lumen #1 Status Flushed;Saline locked;Blood return noted 11/03/22 2241  Lumen #2 Status Flushed;Saline locked;Blood return noted 11/03/22 2241  Lumen #3 Status Flushed;Saline locked;Blood return noted 11/03/22 2241  Dressing Type Transparent;Securing device 11/03/22 2241  Dressing Status Antimicrobial disc in place;Clean, Dry, Intact 11/03/22 2241  Line Care Connections checked and tightened 11/03/22 2241  Line Adjustment (NICU/IV Team Only) No 11/03/22 2241  Dressing Intervention New dressing 11/03/22 2241  Dressing Change Due 11/10/22 11/03/22 2241       Fredis Malkiewicz, Lajean Manes 11/03/2022, 10:42 PM

## 2022-11-04 DIAGNOSIS — I484 Atypical atrial flutter: Secondary | ICD-10-CM | POA: Diagnosis not present

## 2022-11-04 DIAGNOSIS — I4891 Unspecified atrial fibrillation: Secondary | ICD-10-CM | POA: Diagnosis not present

## 2022-11-04 DIAGNOSIS — E875 Hyperkalemia: Secondary | ICD-10-CM | POA: Diagnosis not present

## 2022-11-04 DIAGNOSIS — I5023 Acute on chronic systolic (congestive) heart failure: Secondary | ICD-10-CM | POA: Diagnosis not present

## 2022-11-04 DIAGNOSIS — I483 Typical atrial flutter: Secondary | ICD-10-CM | POA: Diagnosis not present

## 2022-11-04 LAB — COOXEMETRY PANEL
Carboxyhemoglobin: 2 % — ABNORMAL HIGH (ref 0.5–1.5)
Methemoglobin: 1.8 % — ABNORMAL HIGH (ref 0.0–1.5)
O2 Saturation: 72 %
Total hemoglobin: 17.3 g/dL — ABNORMAL HIGH (ref 12.0–16.0)

## 2022-11-04 LAB — BASIC METABOLIC PANEL
Anion gap: 10 (ref 5–15)
BUN: 22 mg/dL — ABNORMAL HIGH (ref 6–20)
CO2: 26 mmol/L (ref 22–32)
Calcium: 8.6 mg/dL — ABNORMAL LOW (ref 8.9–10.3)
Chloride: 93 mmol/L — ABNORMAL LOW (ref 98–111)
Creatinine, Ser: 1.65 mg/dL — ABNORMAL HIGH (ref 0.61–1.24)
GFR, Estimated: 48 mL/min — ABNORMAL LOW (ref >60)
Glucose, Bld: 169 mg/dL — ABNORMAL HIGH (ref 70–99)
Potassium: 4.3 mmol/L (ref 3.5–5.1)
Sodium: 129 mmol/L — ABNORMAL LOW (ref 135–145)

## 2022-11-04 LAB — CBC
HCT: 50 % (ref 39.0–52.0)
Hemoglobin: 16.7 g/dL (ref 13.0–17.0)
MCH: 28.3 pg (ref 26.0–34.0)
MCHC: 33.4 g/dL (ref 30.0–36.0)
MCV: 84.7 fL (ref 80.0–100.0)
Platelets: 130 10*3/uL — ABNORMAL LOW (ref 150–400)
RBC: 5.9 MIL/uL — ABNORMAL HIGH (ref 4.22–5.81)
RDW: 15.8 % — ABNORMAL HIGH (ref 11.5–15.5)
WBC: 10.5 10*3/uL (ref 4.0–10.5)
nRBC: 0 % (ref 0.0–0.2)

## 2022-11-04 LAB — MAGNESIUM: Magnesium: 2.3 mg/dL (ref 1.7–2.4)

## 2022-11-04 LAB — HEPARIN LEVEL (UNFRACTIONATED): Heparin Unfractionated: 0.83 [IU]/mL — ABNORMAL HIGH (ref 0.30–0.70)

## 2022-11-04 MED ORDER — SPIRONOLACTONE 12.5 MG HALF TABLET
12.5000 mg | ORAL_TABLET | Freq: Every day | ORAL | Status: DC
  Administered 2022-11-04: 12.5 mg via ORAL
  Filled 2022-11-04: qty 1

## 2022-11-04 MED ORDER — TORSEMIDE 20 MG PO TABS
60.0000 mg | ORAL_TABLET | Freq: Two times a day (BID) | ORAL | Status: DC
  Administered 2022-11-04: 60 mg via ORAL
  Filled 2022-11-04: qty 3

## 2022-11-04 MED ORDER — ONDANSETRON HCL 4 MG/2ML IJ SOLN: 4.0000 mg | Freq: Three times a day (TID) | INTRAMUSCULAR | Status: DC

## 2022-11-04 MED ORDER — APIXABAN 5 MG PO TABS
5.0000 mg | ORAL_TABLET | Freq: Two times a day (BID) | ORAL | Status: DC
  Administered 2022-11-04 – 2022-11-08 (×9): 5 mg via ORAL
  Filled 2022-11-04 (×9): qty 1

## 2022-11-04 MED ORDER — TORSEMIDE 20 MG PO TABS: 80.0000 mg | ORAL_TABLET | Freq: Two times a day (BID) | ORAL | Status: DC

## 2022-11-04 MED ORDER — FUROSEMIDE 10 MG/ML IJ SOLN
80.0000 mg | Freq: Two times a day (BID) | INTRAMUSCULAR | Status: DC
  Administered 2022-11-04 – 2022-11-05 (×3): 80 mg via INTRAVENOUS
  Filled 2022-11-04 (×3): qty 8

## 2022-11-04 NOTE — Progress Notes (Addendum)
Advanced Heart Failure Rounding Note  PCP-Cardiologist: Armanda Magic, MD   Subjective:   Echo 8/20: EF 25-30%, RV mod reduced. IVC dilated estimated RAP ~15  RHC 8/22 with: RA 21, PA 56/30 (42), PCWP 30 v waves to 55, Fick CO/CI 5.9/2.1, TD CO/CI 4.6/1.7. PAPi 1.2 8/22. Milrinone increased to 0.5 and lasix gtt started at 25mg /hr  Remains in NSR.   SCr down trending 1.71>1.87>1.74>1.65. Lasix gtt stopped 8/26, transitioned to PO Torsemide. -1.8L UOP, net + . Weight up 8lbs (weighted in bed last 2 days). Co-ox 72% on milrinone 0.375  Had some nausea yesterday ~2hrs after digoxin. Today nausea resolved and feels fine. Breathing stable.   Swann #s unavailable.   Objective:   Weight Range: (!) 141.5 kg Body mass index is 36.99 kg/m.   Vital Signs:   Temp:  [97 F (36.1 C)-98.1 F (36.7 C)] 97 F (36.1 C) (08/27 0645) Pulse Rate:  [79-119] 80 (08/27 0645) Resp:  [13-28] 15 (08/27 0645) BP: (97-128)/(69-92) 100/78 (08/27 0600) SpO2:  [89 %-97 %] 92 % (08/27 0645) FiO2 (%):  [21 %] 21 % (08/27 0024) Weight:  [141.5 kg] 141.5 kg (08/27 0500) Last BM Date : 10/29/22  Weight change: Filed Weights   11/02/22 0600 11/03/22 0500 11/04/22 0500  Weight: (!) 137.7 kg (!) 137.5 kg (!) 141.5 kg    Intake/Output:   Intake/Output Summary (Last 24 hours) at 11/04/2022 0729 Last data filed at 11/04/2022 0600 Gross per 24 hour  Intake 2529.88 ml  Output 1815 ml  Net 714.88 ml    Physical Exam  CVP 9 General:  well appearing.  No respiratory difficulty HEENT: normal Neck: supple. JVD ~9 cm. Carotids 2+ bilat; no bruits. No lymphadenopathy or thyromegaly appreciated. Teddy Spike Cor: PMI nondisplaced. Regular rate & rhythm. No rubs, gallops or murmurs. Lungs: clear Abdomen: soft, nontender, nondistended. No hepatosplenomegaly. No bruits or masses. Good bowel sounds. Extremities: no cyanosis, clubbing, rash, edema. PICC RUE Neuro: alert & oriented x 3, cranial nerves grossly  intact. moves all 4 extremities w/o difficulty. Affect pleasant.   Telemetry   NSR 80s (Personally reviewed)    EKG    No new EKG to review   Labs    CBC Recent Labs    11/03/22 0359 11/04/22 0416  WBC 11.3* 10.5  HGB 17.0 16.7  HCT 50.0 50.0  MCV 85.5 84.7  PLT 142* 130*   Basic Metabolic Panel Recent Labs    81/19/14 0540 11/04/22 0416  NA 129* 129*  K 3.4* 4.3  CL 91* 93*  CO2 28 26  GLUCOSE 123* 169*  BUN 22* 22*  CREATININE 1.74* 1.65*  CALCIUM 8.4* 8.6*  MG  --  2.3    BNP: BNP (last 3 results) Recent Labs    10/01/22 1020 10/15/22 0126 10/27/22 1049  BNP 822.7* 777.2* 1,723.3*      Imaging    DG CHEST PORT 1 VIEW  Result Date: 11/04/2022 CLINICAL DATA:  Check central line placement EXAM: PORTABLE CHEST 1 VIEW COMPARISON:  10/28/2022 FINDINGS: Cardiac shadow is enlarged but stable. Right PICC is now seen in satisfactory position. Left jugular Swan-Ganz catheter is noted in the pulmonary outflow tract. Prior right jugular central line has been removed in the interval. Patchy airspace opacity is noted in the right upper lobe increased when compared with the prior study. No other focal abnormality is noted. No pneumothorax is seen. IMPRESSION: Tubes and lines as described above. No pneumothorax is noted. Patchy airspace opacity  in the right upper lobe increased from the prior exam. Electronically Signed   By: Alcide Clever M.D.   On: 11/04/2022 00:44   [place] EKG SITE RITE  Result Date: 11/03/2022 If Site Rite image not attached, placement could not be confirmed due to current cardiac rhythm.    Medications:     Scheduled Medications:  Chlorhexidine Gluconate Cloth  6 each Topical Daily   digoxin  0.125 mg Oral Daily   docusate sodium  100 mg Oral Daily   DULoxetine  20 mg Oral Daily   sodium chloride flush  10-40 mL Intracatheter Q12H   torsemide  40 mg Oral QPM   torsemide  60 mg Oral Daily    Infusions:  sodium chloride Stopped (11/01/22  1157)   amiodarone 60 mg/hr (11/04/22 0649)   heparin 1,800 Units/hr (11/04/22 0600)   milrinone 0.375 mcg/kg/min (11/04/22 0600)    PRN Medications: acetaminophen, ondansetron (ZOFRAN) IV, mouth rinse, sodium chloride flush, witch hazel-glycerin  Patient Profile   57 y/o male w/ chronic systolic heart failure due to NICM, 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   Assessment/Plan  1. Acute on Chronic Systolic Heart Failure w/ Low-Output  - Echo 2011: EF 30-35%. Cath 2011 with normal cors. - Echo 10/17: EF 35-40%. - CMRI 2017 LVEF 20%. No LGE - Echo 12/21 EF 40-45%. Coronary CTA normal cors, calcium score 0   - Cardiac PET 2023- no evidence for cardiac sarcoid or inflammatory. LVEF 38%.   - recent admit 8/24, developed CGS post DCCV. Echo EF 20-25% and RV moderately reduced - now readmitted w/ ADHF in setting of recurrent AFL w/ RVR. NYHA Class IIIB w/ low output   - RHC on 10/30/22 with: RA 21, PA 56/30 (42), PCWP 30 v waves to 55, Fick CO/CI 5.9/2.1, TD CO/CI 4.6/1.7. PAPi 1.2 - Weight up 8lbs?; CVP 9 today. Lasix gtt stopped 8/26. Now on Torsemide 60am/40pm. Will increase to 60 BID today as he's slightly net +. -  Co-ox 72% on milrinone 0.310mcg/kg/min. Wean milrinone down to 0.25 today - Consider removing foley today (will discuss with MD). Would try to add on SGLT2i when this is removed to augment diuresis  - Now with PICC line. Plan to d/c PA cath later today - Continue amio gtt at 60mg /hr until milrinone is weaned off. Plan for 20-30g load.    2. Recurrent AFL w/ RVR  - failed multiple DCCVs as outlined above. Failing PO amiodarone  - continue amio gtt until off inotropes, remains in NSR today - heparin gtt while in ICU - TSH  elevated (8.9). Free T4 1.22. Free T3 1.5 - CPAP at bedtime  - maintenance of NSR imperative. He does not tolerate AFL well. Multiple readmits for ADHF and shock.   3. AKI on CKD IIIB - likely cardiorenal, nonoliguric   - Previously creatinine baseline ~ 1.2. 1.65 today - Recent admit, SCr bumped to 2.2>>down to 1.9 at d/c  - SCr 2.35 on this admit, suspect low-output   4. Hypokalemia - in setting of AKI  - initial K in ER 6.0, treated w/ albuterol, calcium gluconate, and insulin + Lokelma x 2 - Now hypokalemic with aggressive diuresis; discussed with PharmD.    5. OSA  - Continue CPAP nightly.  6. ? PNA -  CXR w/ focal airspace opacity in the right mid lung field, new since prior exam. WBC 14>>21>16>11.3K - PCT 0.17  - Completed rocephin 8/25  7. IDA - Fe 22, T sat 8 - will need IV Fe. Will wait until infection resolves   8. Hyponatremia - Hypervolemic hyponatremia, Na 129.  Fluid restrict.    Length of Stay: 8  Alen Bleacher, NP  11/04/2022, 7:29 AM  Advanced Heart Failure Team Pager [phone] (M-F; 7a - 5p)  Please contact [facility] for night-coverage after hours (5p -7a ) and weekends on [email or web address]  Patient seen with NP, agree with the above note.   Had some nausea yesterday afternoon, feels fine this morning.   He is on milrinone 0.375 with CI 2.5 by Ernestine Conrad, co-ox 72%.  CVP 13 on my read today.  Weight is up (?accuracy).  Creatinine trending down 1.65.   He remains in NSR on heparin gtt and amiodarone gtt.   General: NAD Neck: JVP 12 cm, no thyromegaly or thyroid nodule.  Lungs: Clear to auscultation bilaterally with normal respiratory effort. CV: Nondisplaced PMI.  Heart regular S1/S2, no S3/S4, no murmur.  Trace ankle edema.  Abdomen: Soft, nontender, no hepatosplenomegaly, no distention.  Skin: Intact without lesions or rashes.  Neurologic: Alert and oriented x 3.  Psych: Normal affect. Extremities: No clubbing or cyanosis.  HEENT: Normal.   Agree  with weaning down milrinone to 0.25 today with excellent co-ox and improving creatinine.  Continue digoxin.  Will add spironolactone 12.5 daily.  CVP is still high at 13, he had torsemide this morning.  Will start back Lasix 80 mg IV bid this afternoon, would like to see more volume off.  Can remove Swan today, follow CVP/Co-ox from PICC.   D/C foley today, can start SGLT2 inhibitor likely tomorrow.   He remains in NSR on amiodarone gtt. In the past, had trouble with amiodarone due to nausea with pills but so far doing fine with IV. Will continue amiodarone 60 mg/hr for now for aggressive load, when off milrinone gtt will try po amiodarone again. Ideally, would get to AF/AFL ablation. If we cannot control AF/AFL or he cannot take amiodarone long-term, may have to consider AV nodal ablation-CRT, but currently holding NSR.   CRITICAL CARE Performed by: Marca Ancona  Total critical care time: 35 minutes  Critical care time was exclusive of separately billable procedures and treating other patients.  Critical care was necessary to treat or prevent imminent or life-threatening deterioration.  Critical care was time spent personally by me on the following activities: development of treatment plan with patient and/or surrogate as well as nursing, discussions with consultants, evaluation of patient's response to treatment, examination of patient, obtaining history from patient or surrogate, ordering and performing treatments and interventions, ordering and review of laboratory studies, ordering and review of radiographic studies, pulse oximetry and re-evaluation of patient's condition.  Marca Ancona 11/04/2022 8:58 AM

## 2022-11-04 NOTE — Progress Notes (Addendum)
ANTICOAGULATION CONSULT NOTE  Pharmacy Consult for IV Heparin Indication: atrial fibrillation  Allergies  Allergen Reactions   Bee Venom Anaphylaxis   Amiodarone Nausea Only   Spironolactone     Patient Measurements: Weight: (!) 141.5 kg (311 lb 15.2 oz) Heparin Dosing Weight: ~ 120 kg  Vital Signs: Temp: 97 F (36.1 C) (08/27 0645) BP: 100/78 (08/27 0600) Pulse Rate: 80 (08/27 0645)  Labs: Recent Labs    11/02/22 0304 11/03/22 0359 11/03/22 0540 11/04/22 0416  HGB 16.9 17.0  --  16.7  HCT 49.5 50.0  --  50.0  PLT 167 142*  --  130*  HEPARINUNFRC 0.68 0.60  --  0.83*  CREATININE 1.87*  --  1.74* 1.65*    Estimated Creatinine Clearance: 76 mL/min (A) (by C-G formula based on SCr of 1.65 mg/dL (H)).  Assessment: 57 yo male on chronic Eliquis for afib.  Pharmacy asked to start IV heparin 8/19 while Eliquis is on hold in case of procedures.  Heparin level supratherapeutic at 0.83, Hgb remains stable, platelets drifting down.  No overt bleeding or complications noted.  Drawn appropriately.  8/27 update post rounds: transition from heparin to Eliquis, plans for possible AV nodal ablation / CRT outpatient.  Goal of Therapy:  Heparin level 0.3-0.7 units/ml Monitor platelets by anticoagulation protocol: Yes   Plan:  Stop heparin  Start Eliquis 5 mg BID  Trixie Rude, PharmD Clinical Pharmacist 11/04/2022  7:43 AM  Please check AMION for all [facility] Pharmacy phone numbers After 10:00 PM, call Main Pharmacy [phone]

## 2022-11-04 NOTE — Progress Notes (Addendum)
On assessment of Cardiac Output and Index I noticed the numbers were lower than expected. I repeated the numbers three more times, lower each time I tried. I drew a Coox which was lower than previous draws but seemed to be more within the range of expectations than the higher values. I spoke with the cardiology fellow and he asked me to get an xray as the catheter may be malpositioned, but no further interventions were needed.

## 2022-11-05 ENCOUNTER — Other Ambulatory Visit (HOSPITAL_COMMUNITY): Payer: Self-pay

## 2022-11-05 DIAGNOSIS — I5023 Acute on chronic systolic (congestive) heart failure: Secondary | ICD-10-CM | POA: Diagnosis not present

## 2022-11-05 DIAGNOSIS — E875 Hyperkalemia: Secondary | ICD-10-CM | POA: Diagnosis not present

## 2022-11-05 LAB — BASIC METABOLIC PANEL
Anion gap: 13 (ref 5–15)
BUN: 24 mg/dL — ABNORMAL HIGH (ref 6–20)
CO2: 24 mmol/L (ref 22–32)
Calcium: 7.9 mg/dL — ABNORMAL LOW (ref 8.9–10.3)
Chloride: 88 mmol/L — ABNORMAL LOW (ref 98–111)
Creatinine, Ser: 1.64 mg/dL — ABNORMAL HIGH (ref 0.61–1.24)
GFR, Estimated: 48 mL/min — ABNORMAL LOW (ref >60)
Glucose, Bld: 345 mg/dL — ABNORMAL HIGH (ref 70–99)
Potassium: 3.5 mmol/L (ref 3.5–5.1)
Sodium: 125 mmol/L — ABNORMAL LOW (ref 135–145)

## 2022-11-05 LAB — CBC
HCT: 45.7 % (ref 39.0–52.0)
Hemoglobin: 15 g/dL (ref 13.0–17.0)
MCH: 27.9 pg (ref 26.0–34.0)
MCHC: 32.8 g/dL (ref 30.0–36.0)
MCV: 84.9 fL (ref 80.0–100.0)
Platelets: 106 10*3/uL — ABNORMAL LOW (ref 150–400)
RBC: 5.38 MIL/uL (ref 4.22–5.81)
RDW: 15.7 % — ABNORMAL HIGH (ref 11.5–15.5)
WBC: 8.3 10*3/uL (ref 4.0–10.5)
nRBC: 0 % (ref 0.0–0.2)

## 2022-11-05 LAB — COOXEMETRY PANEL
Carboxyhemoglobin: 1.5 % (ref 0.5–1.5)
Methemoglobin: <0.7 % (ref 0.0–1.5)
O2 Saturation: 71 %
Total hemoglobin: 15.2 g/dL (ref 12.0–16.0)

## 2022-11-05 LAB — GLUCOSE, CAPILLARY
Glucose-Capillary: 149 mg/dL — ABNORMAL HIGH (ref 70–99)
Glucose-Capillary: 121 mg/dL — ABNORMAL HIGH (ref 70–99)
Glucose-Capillary: 104 mg/dL — ABNORMAL HIGH (ref 70–99)

## 2022-11-05 MED ORDER — METOLAZONE 2.5 MG PO TABS
2.5000 mg | ORAL_TABLET | Freq: Once | ORAL | Status: AC
  Administered 2022-11-05: 2.5 mg via ORAL
  Filled 2022-11-05: qty 1

## 2022-11-05 MED ORDER — EMPAGLIFLOZIN 10 MG PO TABS
10.0000 mg | ORAL_TABLET | Freq: Every day | ORAL | Status: DC
  Administered 2022-11-05 – 2022-11-08 (×4): 10 mg via ORAL
  Filled 2022-11-05 (×4): qty 1

## 2022-11-05 MED ORDER — INSULIN ASPART 100 UNIT/ML IJ SOLN
0.0000 [IU] | Freq: Three times a day (TID) | INTRAMUSCULAR | Status: DC
  Administered 2022-11-05 – 2022-11-06 (×3): 2 [IU] via SUBCUTANEOUS
  Administered 2022-11-07: 3 [IU] via SUBCUTANEOUS

## 2022-11-05 MED ORDER — INSULIN ASPART 100 UNIT/ML IJ SOLN: 0.0000 [IU] | Freq: Every day | INTRAMUSCULAR | Status: DC

## 2022-11-05 MED ORDER — SPIRONOLACTONE 25 MG PO TABS
25.0000 mg | ORAL_TABLET | Freq: Every day | ORAL | Status: DC
  Administered 2022-11-05 – 2022-11-08 (×4): 25 mg via ORAL
  Filled 2022-11-05 (×4): qty 1

## 2022-11-05 MED ORDER — POTASSIUM CHLORIDE CRYS ER 20 MEQ PO TBCR
40.0000 meq | EXTENDED_RELEASE_TABLET | ORAL | Status: AC
  Administered 2022-11-05 (×2): 40 meq via ORAL
  Filled 2022-11-05 (×2): qty 2

## 2022-11-05 NOTE — Progress Notes (Signed)
Bladder scanned patient after voiding of urine per MD, bladder scanned twice and showed between 360-420 ml

## 2022-11-05 NOTE — TOC Initial Note (Signed)
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 States he plans to work on [employer] disability. States working has become challenging. Lives at home with spouse. Pt was independent PTA. Will possibly need HH at dc.               Expected Discharge Plan: Home w Home Health Services Barriers to Discharge: Continued Medical Work up   Patient Goals and CMS Choice Patient states their goals for this hospitalization and ongoing recovery are:: wants to recover CMS Medicare.gov Compare Post Acute Care list provided to:: Patient Choice offered to / list presented to : Patient      Expected Discharge Plan and Services   Discharge Planning Services: CM Consult Post Acute Care Choice: Home Health Living arrangements for the past 2 months: Single Family Home                                      Prior Living Arrangements/Services Living arrangements for the past 2 months: Single Family Home Lives with:: Spouse Patient language and need for interpreter reviewed:: Yes Do you feel safe going back to the place where you live?: Yes      Need for Family Participation in Patient Care: No (Comment) Care giver support system in place?: Yes (comment) Current home services: DME (scale) Criminal Activity/Legal Involvement Pertinent to Current Situation/Hospitalization: No - Comment as needed  Activities of Daily Living      Permission Sought/Granted Permission sought to share information with : Case Manager, Family Supports Permission granted to share information with : Yes, Verbal Permission Granted  Share Information with NAME: Harold Park     Permission granted to share info w Relationship:  wife  Permission granted to share info w Contact Information: [phone]  Emotional Assessment Appearance:: Appears stated age Attitude/Demeanor/Rapport: Engaged Affect (typically observed): Accepting Orientation: : Oriented to Self, Oriented to Place, Oriented to  Time, Oriented to Situation   Psych Involvement: No (comment)  Admission diagnosis:  Shortness of breath [R06.02] Hyperkalemia [E87.5] Hypoxia [R09.02] Cardiac arrhythmia, unspecified cardiac arrhythmia type [I49.9] Patient Active Problem List   Diagnosis Date Noted   Hyperkalemia 10/27/2022   Hypotension after procedure 10/15/2022   Heart failure (HCC) 10/14/2022   Hypercoagulable state due to paroxysmal atrial fibrillation (HCC) 10/10/2022   Secondary polycythemia 03/22/2021   Low testosterone in male 10/23/2020   Sarcoidosis 04/09/2020   Obesity (BMI 30-39.9) 12/04/2015   Chronic systolic heart failure (HCC) 08/28/2015   Frequent PVCs 08/28/2015   PAF (paroxysmal atrial fibrillation) (HCC) 08/28/2015   OSA (obstructive sleep apnea) 08/19/2015   Dilated aortic root (HCC) 05/11/2015   Depression 11/20/2010   HEART FAILURE 04/08/2010   FATIGUE 04/08/2010   Congestive heart failure (HCC) 02/08/2010   ATRIAL FLUTTER 02/07/2010   Tachycardia 01/23/2010   DYSPNEA 01/23/2010   ABNORMAL ECHOCARDIOGRAM 01/10/2009   CHEST PAIN UNSPECIFIED 11/21/2008   Essential hypertension 11/20/2008   PCP:  Kristian Covey, MD Pharmacy:   [facility] [phone] - [place],  - [address] [place]  [place] [address] Phone: [phone] Fax: [phone]  [facility] [place] - [address] [place] [address] Phone: [phone] Fax: [phone]  [facility] - [place], [place] - [address] Ly [address] Jerlyn Ly [place] [address] Phone: [phone] Fax:  [phone]     Social Determinants of Health (SDOH) Social History: SDOH Screenings   Food Insecurity: No Food Insecurity (10/15/2022)  Housing: Low Risk  (10/15/2022)  Transportation Needs: No Transportation Needs (10/15/2022)  Utilities: Not At Risk (10/15/2022)  Depression (PHQ2-9): Low Risk  (11/29/2021)  Tobacco Use: Low Risk  (10/27/2022)   SDOH Interventions:     Readmission Risk Interventions     No data to display

## 2022-11-05 NOTE — TOC Benefit Eligibility Note (Signed)
Patient Product/process development scientist completed.    The patient is insured through [facility]. Patient has [employer], may use a copay card, and/or apply for patient assistance if available.    Ran test claim for Farxiga 10 mg and the current 30 day co-pay is $0.00.  Ran test claim for Jardiance 10 mg and the current 30 day co-pay is $0.00.  This test claim was processed through [facility]- copay amounts may vary at other pharmacies due to pharmacy/plan contracts, or as the patient moves through the different stages of their insurance plan.     Roland Earl, CPHT Pharmacy Technician III Certified Patient Advocate [facility] Patient Advocate Team Direct Number: [phone]  Fax: [phone]

## 2022-11-05 NOTE — Progress Notes (Addendum)
Advanced Heart Failure Rounding Note  PCP-Cardiologist: Armanda Magic, MD   Subjective:   Echo 8/20: EF 25-30%, RV mod reduced. IVC dilated estimated RAP ~15  RHC 8/22 with: RA 21, PA 56/30 (42), PCWP 30 v waves to 55, Fick CO/CI 5.9/2.1, TD CO/CI 4.6/1.7. PAPi 1.2 8/22. Milrinone increased to 0.5 and lasix gtt started at 25mg /hr  Remains in NSR.   SCr 1.71>1.87>1.74>1.65>1.64. Lasix gtt stopped 8/26, transitioned to PO Torsemide. IV lasix restarted yesterday with weight gain and net +. Today remains net + despite IV lasix 80 mg x1 yesterday. Co-ox 71% on milrinone 0.25  Feels fine this morning. Able to rest last night. Walked in the halls yesterday.   Objective:   Weight Range: (!) 138.7 kg Body mass index is 36.26 kg/m.   Vital Signs:   Temp:  [97 F (36.1 C)-97.9 F (36.6 C)] 97.9 F (36.6 C) (08/28 0600) Pulse Rate:  [75-209] 84 (08/28 0700) Resp:  [10-26] 19 (08/28 0700) BP: (85-115)/(61-93) 106/93 (08/28 0700) SpO2:  [82 %-98 %] 91 % (08/28 0700) FiO2 (%):  [21 %] 21 % (08/27 2245) Weight:  [138.5 kg-138.7 kg] 138.7 kg (08/28 0500) Last BM Date : 10/29/22  Weight change: Filed Weights   11/04/22 0500 11/04/22 1845 11/05/22 0500  Weight: (!) 141.5 kg (!) 138.5 kg (!) 138.7 kg    Intake/Output:   Intake/Output Summary (Last 24 hours) at 11/05/2022 0747 Last data filed at 11/05/2022 0600 Gross per 24 hour  Intake 2655.08 ml  Output 1835 ml  Net 820.08 ml    Physical Exam  CVP 7 General:  well appearing.  No respiratory difficulty HEENT: normal Neck: supple. JVD ~8 cm. Carotids 2+ bilat; no bruits. No lymphadenopathy or thyromegaly appreciated. Cor: PMI nondisplaced. Regular rate & rhythm. No rubs, gallops or murmurs. Lungs: clear Abdomen: soft, nontender, nondistended. No hepatosplenomegaly. No bruits or masses. Good bowel sounds. Extremities: no cyanosis, clubbing, rash, +1 BLE edema. PICC RUE Neuro: alert & oriented x 3, cranial nerves grossly intact.  moves all 4 extremities w/o difficulty. Affect pleasant.   Telemetry   NSR 70s (Personally reviewed)    EKG    No new EKG to review   Labs    CBC Recent Labs    11/04/22 0416 11/05/22 0414  WBC 10.5 8.3  HGB 16.7 15.0  HCT 50.0 45.7  MCV 84.7 84.9  PLT 130* 106*   Basic Metabolic Panel Recent Labs    86/57/84 0416 11/05/22 0414  NA 129* 125*  K 4.3 3.5  CL 93* 88*  CO2 26 24  GLUCOSE 169* 345*  BUN 22* 24*  CREATININE 1.65* 1.64*  CALCIUM 8.6* 7.9*  MG 2.3  --     BNP: BNP (last 3 results) Recent Labs    10/01/22 1020 10/15/22 0126 10/27/22 1049  BNP 822.7* 777.2* 1,723.3*      Imaging    No results found.   Medications:     Scheduled Medications:  apixaban  5 mg Oral BID   Chlorhexidine Gluconate Cloth  6 each Topical Daily   digoxin  0.125 mg Oral Daily   docusate sodium  100 mg Oral Daily   DULoxetine  20 mg Oral Daily   furosemide  80 mg Intravenous BID   sodium chloride flush  10-40 mL Intracatheter Q12H   spironolactone  12.5 mg Oral Daily    Infusions:  sodium chloride 15 mL/hr at 11/05/22 0600   amiodarone 60 mg/hr (11/05/22 0600)   milrinone  0.25 mcg/kg/min (11/05/22 0600)    PRN Medications: acetaminophen, ondansetron (ZOFRAN) IV, mouth rinse, sodium chloride flush, witch hazel-glycerin  Patient Profile   57 y/o male w/ chronic systolic heart failure due to NICM, 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   Assessment/Plan  1. Acute on Chronic Systolic Heart Failure w/ Low-Output  - Echo 2011: EF 30-35%. Cath 2011 with normal cors. - Echo 10/17: EF 35-40%. - CMRI 2017 LVEF 20%. No LGE - Echo 12/21 EF 40-45%. Coronary CTA normal cors, calcium score 0   - Cardiac PET 2023- no  evidence for cardiac sarcoid or inflammatory. LVEF 38%.   - recent admit 8/24, developed CGS post DCCV. Echo EF 20-25% and RV moderately reduced - now readmitted w/ ADHF in setting of recurrent AFL w/ RVR. NYHA Class IIIB w/ low output   - RHC on 10/30/22 with: RA 21, PA 56/30 (42), PCWP 30 v waves to 55, Fick CO/CI 5.9/2.1, TD CO/CI 4.6/1.7. PAPi 1.2 -  CVP 7 today with more BLE edema. Lasix gtt stopped 8/26. Transitioned briefly to PO but fluid started to accumulate again. S/p 80 IV lasix x1. Weight unchanged. Net + . Will do 80 IV BID today. Suspect he may be able to transition to PO tomorrow.   -  Co-ox 71% on milrinone 0.49mcg/kg/min. Wean to 0.125 - Increase spiro 12.5>25 mg daily - Foley removed yesterday, start Jardiance 10 mg daily - Place TED hose - Continue amio gtt at 60mg /hr until milrinone is weaned off. Plan for 20-30g load.    2. Recurrent AFL w/ RVR  - failed multiple DCCVs as outlined above. Failing PO amiodarone  - continue amio gtt until off inotropes, remains in NSR today - heparin gtt while in ICU - TSH elevated (8.9). Free T4 1.22. Free T3 1.5 - CPAP at bedtime  - maintenance of NSR imperative. He does not tolerate AFL well. Multiple readmits for ADHF and shock.   3. AKI on CKD IIIB - likely cardiorenal, nonoliguric   - Previously creatinine baseline ~ 1.2. 1.64 today - Recent admit, SCr bumped to 2.2>>down to 1.9 at d/c  - SCr 2.35 on this admit, suspect low-output   4. Hypokalemia - in setting of AKI  - initial K in ER 6.0, treated w/ albuterol, calcium gluconate, and insulin + Lokelma x 2 - Now hypokalemic with aggressive diuresis; discussed with PharmD.    5. OSA  - Continue CPAP nightly.   6. ? PNA -  CXR w/ focal airspace opacity in the right mid lung field, new since prior exam. WBC 14>>21>16>11.3K - PCT 0.17  - Completed rocephin 8/25  7. IDA - Fe 22, T sat 8 - will need IV Fe. Will wait until infection resolves   8. Hyponatremia -  Hypervolemic hyponatremia, Na 125.  Fluid restrict.  - corrected Na with hyperglycemia 131  9. Type 2 DM - Add SSI  Length of Stay: 9  Alen Bleacher, NP  11/05/2022, 7:47 AM  Advanced Heart Failure Team Pager [phone] (M-F; 7a - 5p)  Please contact [facility] for night-coverage after hours (5p -7a ) and weekends on [email or web address]  Patient seen with NP, agree with the above note.   Patient got IV Lasix yesterday, weight stable but CVP 14 on  my read today.  Co-ox 71%.  He remains in NSR on amiodarone gtt 60 mg/hr. Creatinine stable 1.64.   No nausea this morning, denies dyspnea.   General: NAD Neck: JVP 10 cm, no thyromegaly or thyroid nodule.  Lungs: Clear to auscultation bilaterally with normal respiratory effort. CV: Nondisplaced PMI.  Heart regular S1/S2, no S3/S4, no murmur.  1+ ankle edema.  Abdomen: Soft, nontender, no hepatosplenomegaly, no distention.  Skin: Intact without lesions or rashes.  Neurologic: Alert and oriented x 3.  Psych: Normal affect. Extremities: No clubbing or cyanosis.  HEENT: Normal.   Agree with weaning down milrinone to 0.125 today with excellent co-ox and improving creatinine.  Continue digoxin.  Increase spironolactone to 25 mg daily and add Jardiance 10 mg daily.  ARB tomorrow if creatinine and BP remain stable.  CVP is still high at 14, will give Lasix 80 mg IV bid with metolazone 2.5 x 1 today.  Replace K.   Na 125 but corrects to 131 for hyperglycemia.  Will fluid restrict.  Will add SSI to regimen (glucose has not been this high prior).     He remains in NSR on amiodarone gtt. In the past, had trouble with amiodarone due to nausea with pills but so far doing fine with IV. Will continue amiodarone 60 mg/hr for now for aggressive load, when off milrinone gtt will try po amiodarone again. Ideally, would get to AF/AFL ablation. If we cannot control AF/AFL or he cannot take amiodarone long-term, may have to consider AV nodal ablation-CRT, but currently  holding NSR.   OK for progressive transfer today.   Marca Ancona 11/05/2022 9:49 AM

## 2022-11-06 DIAGNOSIS — I5023 Acute on chronic systolic (congestive) heart failure: Secondary | ICD-10-CM | POA: Diagnosis not present

## 2022-11-06 DIAGNOSIS — E875 Hyperkalemia: Secondary | ICD-10-CM | POA: Diagnosis not present

## 2022-11-06 LAB — CBC
HCT: 49.3 % (ref 39.0–52.0)
Hemoglobin: 16.7 g/dL (ref 13.0–17.0)
MCH: 28.4 pg (ref 26.0–34.0)
MCHC: 33.9 g/dL (ref 30.0–36.0)
MCV: 83.7 fL (ref 80.0–100.0)
Platelets: 116 10*3/uL — ABNORMAL LOW (ref 150–400)
RBC: 5.89 MIL/uL — ABNORMAL HIGH (ref 4.22–5.81)
RDW: 16.3 % — ABNORMAL HIGH (ref 11.5–15.5)
WBC: 10.7 10*3/uL — ABNORMAL HIGH (ref 4.0–10.5)
nRBC: 0 % (ref 0.0–0.2)

## 2022-11-06 LAB — MAGNESIUM: Magnesium: 2.1 mg/dL (ref 1.7–2.4)

## 2022-11-06 LAB — COOXEMETRY PANEL
Carboxyhemoglobin: 2 % — ABNORMAL HIGH (ref 0.5–1.5)
Methemoglobin: <0.7 % (ref 0.0–1.5)
O2 Saturation: 80.9 %
Total hemoglobin: 17.2 g/dL — ABNORMAL HIGH (ref 12.0–16.0)

## 2022-11-06 LAB — GLUCOSE, CAPILLARY
Glucose-Capillary: 114 mg/dL — ABNORMAL HIGH (ref 70–99)
Glucose-Capillary: 150 mg/dL — ABNORMAL HIGH (ref 70–99)
Glucose-Capillary: 110 mg/dL — ABNORMAL HIGH (ref 70–99)

## 2022-11-06 LAB — BASIC METABOLIC PANEL
Anion gap: 12 (ref 5–15)
BUN: 22 mg/dL — ABNORMAL HIGH (ref 6–20)
CO2: 24 mmol/L (ref 22–32)
Calcium: 8.3 mg/dL — ABNORMAL LOW (ref 8.9–10.3)
Chloride: 92 mmol/L — ABNORMAL LOW (ref 98–111)
Creatinine, Ser: 1.61 mg/dL — ABNORMAL HIGH (ref 0.61–1.24)
GFR, Estimated: 49 mL/min — ABNORMAL LOW (ref >60)
Glucose, Bld: 179 mg/dL — ABNORMAL HIGH (ref 70–99)
Potassium: 3.3 mmol/L — ABNORMAL LOW (ref 3.5–5.1)
Sodium: 128 mmol/L — ABNORMAL LOW (ref 135–145)

## 2022-11-06 MED ORDER — AMIODARONE HCL 200 MG PO TABS
400.0000 mg | ORAL_TABLET | Freq: Every day | ORAL | Status: DC
  Administered 2022-11-06 – 2022-11-07 (×2): 400 mg via ORAL
  Filled 2022-11-06 (×2): qty 2

## 2022-11-06 MED ORDER — POTASSIUM CHLORIDE CRYS ER 20 MEQ PO TBCR
40.0000 meq | EXTENDED_RELEASE_TABLET | Freq: Two times a day (BID) | ORAL | Status: DC
  Administered 2022-11-06 (×2): 40 meq via ORAL
  Filled 2022-11-06 (×2): qty 2

## 2022-11-06 MED ORDER — TORSEMIDE 20 MG PO TABS
60.0000 mg | ORAL_TABLET | Freq: Two times a day (BID) | ORAL | Status: DC
  Administered 2022-11-06 – 2022-11-08 (×5): 60 mg via ORAL
  Filled 2022-11-06 (×5): qty 3

## 2022-11-06 MED ORDER — POLYETHYLENE GLYCOL 3350 17 G PO PACK
17.0000 g | PACK | Freq: Every day | ORAL | Status: DC
  Administered 2022-11-06 – 2022-11-08 (×3): 17 g via ORAL
  Filled 2022-11-06 (×3): qty 1

## 2022-11-06 MED ORDER — METOLAZONE 2.5 MG PO TABS
2.5000 mg | ORAL_TABLET | Freq: Once | ORAL | Status: AC
  Administered 2022-11-06: 2.5 mg via ORAL
  Filled 2022-11-06: qty 1

## 2022-11-06 MED ORDER — POTASSIUM CHLORIDE CRYS ER 20 MEQ PO TBCR
40.0000 meq | EXTENDED_RELEASE_TABLET | Freq: Once | ORAL | Status: AC
  Administered 2022-11-06: 40 meq via ORAL
  Filled 2022-11-06: qty 2

## 2022-11-06 MED ORDER — DOCUSATE SODIUM 100 MG PO CAPS: 100.0000 mg | ORAL_CAPSULE | Freq: Two times a day (BID) | ORAL | Status: DC | PRN

## 2022-11-06 NOTE — Progress Notes (Signed)
Desats to 80's on [place] 4 L. Denied any respiratory discomfort. Oxygen increased to 6 L [place]. Sat-92 %.  Continue to monitor.

## 2022-11-06 NOTE — Plan of Care (Signed)

## 2022-11-06 NOTE — Progress Notes (Signed)
   11/06/22 1920  BiPAP/CPAP/SIPAP  $ Non-Invasive Home Ventilator  Subsequent  BiPAP/CPAP/SIPAP Pt Type Adult  BiPAP/CPAP/SIPAP  (Home Unit)  Mask Type Nasal pillows  Flow Rate 4 lpm  Patient Home Equipment Yes  CPAP/SIPAP surface wiped down Yes  Safety Check Completed by RT for Home Unit Yes, no issues noted  BiPAP/CPAP /SiPAP Vitals  Bilateral Breath Sounds Clear;Diminished

## 2022-11-06 NOTE — Progress Notes (Addendum)
Advanced Heart Failure Rounding Note  PCP-Cardiologist: Armanda Magic, MD   Subjective:   Echo 8/20: EF 25-30%, RV mod reduced. IVC dilated estimated RAP ~15  RHC 8/22 with: RA 21, PA 56/30 (42), PCWP 30 v waves to 55, Fick CO/CI 5.9/2.1, TD CO/CI 4.6/1.7. PAPi 1.2 8/22. Milrinone increased to 0.5 and lasix gtt started at 25mg /hr 8/26 Lasix gtt stopped 8/26, transitioned to PO Torsemide.  8/27 IV lasix restarted.  8/28 Milrinone weaned to 0.125 mcg.   CO-OX 81% on Milrinone 0.125 mcg. + amio 60 mg per hour.   Denies SOB. Complaining of constipation.      Objective:   Weight Range: (!) 137.6 kg Body mass index is 35.97 kg/m.   Vital Signs:   Temp:  [97.6 F (36.4 C)-98.1 F (36.7 C)] 97.8 F (36.6 C) (08/29 0716) Pulse Rate:  [73-91] 83 (08/29 0716) Resp:  [12-23] 19 (08/29 0716) BP: (95-128)/(76-89) 99/76 (08/29 0716) SpO2:  [91 %-96 %] 93 % (08/29 0716) Weight:  [137.6 kg-138.6 kg] 137.6 kg (08/29 0444) Last BM Date : 10/29/22  Weight change: Filed Weights   11/05/22 0500 11/05/22 1534 11/06/22 0444  Weight: (!) 138.7 kg (!) 138.6 kg (!) 137.6 kg    Intake/Output:   Intake/Output Summary (Last 24 hours) at 11/06/2022 0722 Last data filed at 11/06/2022 0000 Gross per 24 hour  Intake 1280.47 ml  Output 3150 ml  Net -1869.53 ml   CVP 7-8  Physical Exam  General:   No resp difficulty HEENT: normal Neck: supple. no JVD. Carotids 2+ bilat; no bruits. No lymphadenopathy or thryomegaly appreciated. Cor: PMI nondisplaced. Regular rate & rhythm. No rubs, gallops or murmurs. Lungs: clear Abdomen: soft, nontender, nondistended. No hepatosplenomegaly. No bruits or masses. Good bowel sounds. Extremities: no cyanosis, clubbing, rash, edema. RUE PICC Neuro: alert & orientedx3, cranial nerves grossly intact. moves all 4 extremities w/o difficulty. Affect pleasant  Telemetry   SR 70-80s EKG    No new EKG to review   Labs    CBC Recent Labs    11/05/22 0414  11/06/22 0435  WBC 8.3 10.7*  HGB 15.0 16.7  HCT 45.7 49.3  MCV 84.9 83.7  PLT 106* 116*   Basic Metabolic Panel Recent Labs    13/08/65 0416 11/05/22 0414 11/06/22 0435  NA 129* 125* 128*  K 4.3 3.5 3.3*  CL 93* 88* 92*  CO2 26 24 24   GLUCOSE 169* 345* 179*  BUN 22* 24* 22*  CREATININE 1.65* 1.64* 1.61*  CALCIUM 8.6* 7.9* 8.3*  MG 2.3  --  2.1    BNP: BNP (last 3 results) Recent Labs    10/01/22 1020 10/15/22 0126 10/27/22 1049  BNP 822.7* 777.2* 1,723.3*      Imaging    No results found.   Medications:     Scheduled Medications:  apixaban  5 mg Oral BID   Chlorhexidine Gluconate Cloth  6 each Topical Daily   digoxin  0.125 mg Oral Daily   docusate sodium  100 mg Oral Daily   DULoxetine  20 mg Oral Daily   empagliflozin  10 mg Oral Daily   furosemide  80 mg Intravenous BID   insulin aspart  0-15 Units Subcutaneous TID WC   insulin aspart  0-5 Units Subcutaneous QHS   sodium chloride flush  10-40 mL Intracatheter Q12H   spironolactone  25 mg Oral Daily    Infusions:  sodium chloride Stopped (11/05/22 0747)   amiodarone 60 mg/hr (11/06/22 0421)  milrinone 0.125 mcg/kg/min (11/05/22 2323)    PRN Medications: acetaminophen, ondansetron (ZOFRAN) IV, mouth rinse, sodium chloride flush, witch hazel-glycerin  Patient Profile   57 y/o male w/ chronic systolic heart failure due to NICM, 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   Assessment/Plan  1. Acute on Chronic Systolic Heart Failure w/ Low-Output  - Echo 2011: EF 30-35%. Cath 2011 with normal cors. - Echo 10/17: EF 35-40%. - CMRI 2017 LVEF 20%. No LGE - Echo 12/21 EF 40-45%. Coronary CTA normal cors, calcium score 0   - Cardiac PET 2023- no  evidence for cardiac sarcoid or inflammatory. LVEF 38%.   - recent admit 8/24, developed CGS post DCCV. Echo EF 20-25% and RV moderately reduced - now readmitted w/ ADHF in setting of recurrent AFL w/ RVR. NYHA Class IIIB w/ low output   - RHC on 10/30/22 with: RA 21, PA 56/30 (42), PCWP 30 v waves to 55, Fick CO/CI 5.9/2.1, TD CO/CI 4.6/1.7. PAPi 1.2 - Volume status improved. CVP 7-8. Stop IV lasix. Start torsemide 60 mg twice a day.  - CO-OX 80%. Stop milrinone.  - Continue Spiro 25 mg daily - Continue Jardiance 10 mg daily - Place TED hose   2. Recurrent AFL w/ RVR  - failed multiple DCCVs as outlined above. Failing PO amiodarone  -  TSH elevated (8.9). Free T4 1.22. Free T3 1.5 - CPAP at bedtime  - Stopping milrinone today. In SR today. Stop amio drip. Start Amio 400 mg twice a day.  - Continue eliquis 5 mg twice a day.   3. AKI on CKD IIIB - likely cardiorenal, nonoliguric   - Previously creatinine baseline ~ 1.2. 1.6 today - Recent admit, SCr bumped to 2.2>>down to 1.9 at d/c  - SCr 2.35 on this admit, suspect low-output   4. Hypokalemia - in setting of AKI  - initial K in ER 6.0, treated w/ albuterol, calcium gluconate, and insulin + Lokelma x 2 - Now hypokalemic with aggressive diuresis - Replace K    5. OSA  - Continue CPAP nightly.   6. ? PNA -  CXR w/ focal airspace opacity in the right mid lung field, new since prior exam. WBC down 10.7  - PCT 0.17  - Completed rocephin 8/25  7. IDA - Fe 22, T sat 8 - will need IV Fe. Will wait until infection resolves   8. Hyponatremia - Hypervolemic hyponatremia, Na 128.  Fluid restrict.   9. Type 2 DM - Continue SSI  10. Constipation Mobilize today. Add colace and miralax  Ambulate.   Length of Stay: 10  Tonye Becket, NP  11/06/2022, 7:22 AM  Advanced Heart Failure Team Pager [phone] (M-F; 7a - 5p)  Please contact [facility] for night-coverage after hours (5p -7a ) and weekends on [email or web address]  Patient seen  with NP, agree with the above note.   Good diuresis yesterday, CVP was 7 when measured this morning but I get 11 currently.  Co-ox 80%.  He remains in NSR.  Creatinine stable at 1.61.   General: NAD Neck: JVP 8-9 cm, no thyromegaly or thyroid nodule.  Lungs: Clear to auscultation bilaterally with normal respiratory effort. CV: Nondisplaced PMI.  Heart regular S1/S2, no S3/S4, no murmur.  1+ ankle edema.  Abdomen: Soft, nontender, no hepatosplenomegaly, no distention.  Skin: Intact without lesions or rashes.  Neurologic: Alert and oriented x 3.  Psych: Normal affect. Extremities: No clubbing or cyanosis.  HEENT: Normal.   Agree with stopping milrinone today with excellent co-ox and improving creatinine.  Continue digoxin.  Continue spironolactone 25 mg daily and Jardiance 10 mg daily.  No losartan yet as SBP in 90s.  Good diuresis yesterday, weight down 2 lbs.  He has been transitioned to torsemide 60 mg po bid. I measured a CVP of 11, will give a dose of metolazone 2.5 with pm torsemide.      He remains in NSR on amiodarone gtt. In the past, had trouble with amiodarone due to nausea with pills but so far doing fine with IV. Stop amiodarone gtt today with discontinuation of milrinone, start amiodarone 400 mg bid. Ideally, would get to AF/AFL ablation. If we cannot control AF/AFL or he cannot take amiodarone long-term, may have to consider AV nodal ablation-CRT, but currently holding NSR.  Continue apixaban.   Marca Ancona 11/06/2022 1:09 PM

## 2022-11-06 NOTE — Progress Notes (Signed)
Mobility Specialist Progress Note:   11/06/22 0958  Mobility  Activity Ambulated with assistance in hallway  Level of Assistance Contact guard assist, steadying assist  Assistive Device None  Distance Ambulated (ft) 272 ft  Activity Response Tolerated well  Mobility Referral Yes  $Mobility charge 1 Mobility  Mobility Specialist Start Time (ACUTE ONLY) [phone]  Mobility Specialist Stop Time (ACUTE ONLY) 0950  Mobility Specialist Time Calculation (min) (ACUTE ONLY) 13 min   Pre Mobility: 80 HR  During Mobility: 89 HR , 90% SpO2 RA Post Mobility: 85 HR ,105/76 BP, 91% SpO2 RA  Pt received in bed, agreeable to mobility. Slight left drift in gait during ambulation requiring CG for safety but no LOB. Pt denied any lightheadedness or SOB during session. Pt left in bed with call bell in reach and all needs met.   Leory Plowman  Mobility Specialist Please contact via [employer] office at [phone]

## 2022-11-06 NOTE — Progress Notes (Signed)
[place] to 80's on room air. Placed on oxygen 2 L [place] sats-90. %

## 2022-11-07 ENCOUNTER — Other Ambulatory Visit (HOSPITAL_COMMUNITY): Payer: Self-pay

## 2022-11-07 ENCOUNTER — Encounter (HOSPITAL_COMMUNITY): Payer: BC Managed Care – PPO

## 2022-11-07 DIAGNOSIS — I5023 Acute on chronic systolic (congestive) heart failure: Secondary | ICD-10-CM | POA: Diagnosis not present

## 2022-11-07 DIAGNOSIS — E875 Hyperkalemia: Secondary | ICD-10-CM | POA: Diagnosis not present

## 2022-11-07 LAB — BASIC METABOLIC PANEL
Anion gap: 11 (ref 5–15)
BUN: 28 mg/dL — ABNORMAL HIGH (ref 6–20)
CO2: 28 mmol/L (ref 22–32)
Calcium: 8.9 mg/dL (ref 8.9–10.3)
Chloride: 93 mmol/L — ABNORMAL LOW (ref 98–111)
Creatinine, Ser: 1.89 mg/dL — ABNORMAL HIGH (ref 0.61–1.24)
GFR, Estimated: 41 mL/min — ABNORMAL LOW (ref >60)
Glucose, Bld: 84 mg/dL (ref 70–99)
Potassium: 3.6 mmol/L (ref 3.5–5.1)
Sodium: 132 mmol/L — ABNORMAL LOW (ref 135–145)

## 2022-11-07 LAB — GLUCOSE, CAPILLARY
Glucose-Capillary: 109 mg/dL — ABNORMAL HIGH (ref 70–99)
Glucose-Capillary: 110 mg/dL — ABNORMAL HIGH (ref 70–99)
Glucose-Capillary: 155 mg/dL — ABNORMAL HIGH (ref 70–99)
Glucose-Capillary: 83 mg/dL (ref 70–99)
Glucose-Capillary: 89 mg/dL (ref 70–99)

## 2022-11-07 LAB — COOXEMETRY PANEL
Carboxyhemoglobin: 2.2 % — ABNORMAL HIGH (ref 0.5–1.5)
Methemoglobin: 0.8 % (ref 0.0–1.5)
O2 Saturation: 68.9 %
Total hemoglobin: 19.2 g/dL — ABNORMAL HIGH (ref 12.0–16.0)

## 2022-11-07 MED ORDER — DIGOXIN 125 MCG PO TABS
0.1250 mg | ORAL_TABLET | Freq: Every day | ORAL | 5 refills | Status: DC
  Filled 2022-11-07: qty 30, 30d supply, fill #0
  Filled 2022-12-09: qty 30, 30d supply, fill #1
  Filled 2022-12-28 – 2023-01-01 (×2): qty 30, 30d supply, fill #2
  Filled 2023-02-03: qty 30, 30d supply, fill #0
  Filled 2023-02-15 – 2023-03-06 (×2): qty 30, 30d supply, fill #1
  Filled 2023-04-05: qty 30, 30d supply, fill #2

## 2022-11-07 MED ORDER — AMIODARONE HCL 200 MG PO TABS
400.0000 mg | ORAL_TABLET | Freq: Two times a day (BID) | ORAL | Status: DC
  Administered 2022-11-07 – 2022-11-08 (×2): 400 mg via ORAL
  Filled 2022-11-07 (×2): qty 2

## 2022-11-07 MED ORDER — TORSEMIDE 20 MG PO TABS
60.0000 mg | ORAL_TABLET | Freq: Two times a day (BID) | ORAL | 5 refills | Status: DC
  Filled 2022-11-07: qty 180, 30d supply, fill #0

## 2022-11-07 MED ORDER — SPIRONOLACTONE 25 MG PO TABS
25.0000 mg | ORAL_TABLET | Freq: Every day | ORAL | 5 refills | Status: DC
  Filled 2022-11-07: qty 30, 30d supply, fill #0
  Filled 2022-12-09: qty 30, 30d supply, fill #1
  Filled 2022-12-28 – 2023-01-01 (×2): qty 30, 30d supply, fill #2
  Filled 2023-02-03: qty 30, 30d supply, fill #0
  Filled 2023-02-15 – 2023-03-06 (×2): qty 30, 30d supply, fill #1
  Filled 2023-04-05: qty 30, 30d supply, fill #2

## 2022-11-07 MED ORDER — EMPAGLIFLOZIN 10 MG PO TABS
10.0000 mg | ORAL_TABLET | Freq: Every day | ORAL | 5 refills | Status: DC
Start: 2022-11-08 — End: 2023-07-07
  Filled 2022-11-07: qty 30, 30d supply, fill #0
  Filled 2022-12-09: qty 30, 30d supply, fill #1
  Filled 2023-01-11: qty 30, 30d supply, fill #2
  Filled 2023-02-24: qty 30, 30d supply, fill #0
  Filled 2023-03-06 – 2023-04-05 (×2): qty 30, 30d supply, fill #1
  Filled 2023-05-11: qty 30, 30d supply, fill #2

## 2022-11-07 MED ORDER — POTASSIUM CHLORIDE CRYS ER 20 MEQ PO TBCR
40.0000 meq | EXTENDED_RELEASE_TABLET | Freq: Every day | ORAL | Status: DC
  Administered 2022-11-07 – 2022-11-08 (×2): 40 meq via ORAL
  Filled 2022-11-07 (×2): qty 2

## 2022-11-07 MED ORDER — AMIODARONE HCL 200 MG PO TABS
ORAL_TABLET | ORAL | 6 refills | Status: AC
Start: 2022-11-07 — End: 2022-12-04
  Filled 2022-11-07: qty 70, fill #0

## 2022-11-07 NOTE — Progress Notes (Signed)
Mobility Specialist Progress Note:   11/07/22 1010  Mobility  Activity Ambulated with assistance in hallway  Level of Assistance Contact guard assist, steadying assist  Assistive Device None  Distance Ambulated (ft) 250 ft  Activity Response Tolerated well  Mobility Referral Yes  $Mobility charge 1 Mobility  Mobility Specialist Start Time (ACUTE ONLY) 0915  Mobility Specialist Stop Time (ACUTE ONLY) 0930  Mobility Specialist Time Calculation (min) (ACUTE ONLY) 15 min   Pre Mobility: 81 HR , 89% SpO2 Post Mobility: 90 HR ,93% SpO2  Pt received in bed, agreeable to mobility. Denied any feelings of discomfort during ambulation, asymptomatic throughout. Pt returned to bed with call bell in reach and all needs met.   Leory Plowman  Mobility Specialist Please contact via [employer] office at [phone]

## 2022-11-07 NOTE — Plan of Care (Signed)
  Problem: Nutrition: Goal: Adequate nutrition will be maintained Outcome: Progressing   Problem: Coping: Goal: Level of anxiety will decrease Outcome: Progressing   Problem: Elimination: Goal: Will not experience complications related to bowel motility Outcome: Progressing Goal: Will not experience complications related to urinary retention Outcome: Progressing   

## 2022-11-07 NOTE — Discharge Summary (Signed)
Error

## 2022-11-07 NOTE — Plan of Care (Signed)
Discussed with patient plan of care for the evening, pain management and bedtime medications with some teach back displayed.  What was important is to help him clean and place his CPAP from home on.  He states oxygen is bleed into it.  Problem: Education: Goal: Knowledge of General Education information will improve Description: Including pain rating scale, medication(s)/side effects and non-pharmacologic comfort measures Outcome: Progressing   Problem: Health Behavior/Discharge Planning: Goal: Ability to manage health-related needs will improve Outcome: Progressing   Problem: Pain Managment: Goal: General experience of comfort will improve Outcome: Progressing

## 2022-11-07 NOTE — Progress Notes (Addendum)
Advanced Heart Failure Rounding Note  PCP-Cardiologist: Armanda Magic, MD   Subjective:   Echo 8/20: EF 25-30%, RV mod reduced. IVC dilated estimated RAP ~15  RHC 8/22 with: RA 21, PA 56/30 (42), PCWP 30 v waves to 55, Fick CO/CI 5.9/2.1, TD CO/CI 4.6/1.7. PAPi 1.2 8/22. Milrinone increased to 0.5 and lasix gtt started at 25mg /hr 8/26 Lasix gtt stopped 8/26, transitioned to PO Torsemide.  8/27 IV lasix restarted.  8/28 Milrinone weaned to 0.125 mcg.  8/29 Milrinone off  CO-OX 69%. Milrinone and amio gtt's stopped yesterday.    Diuresed with 60 PO torsemide BID + metolazone 2.5 x1. -5.6L UOP. Weight down more 13lbs. CVP 5/6  Feels much better this morning.   Objective:   Weight Range: 131.7 kg Body mass index is 34.43 kg/m.   Vital Signs:   Temp:  [97.7 F (36.5 C)-98.1 F (36.7 C)] 98.1 F (36.7 C) (08/30 0300) Pulse Rate:  [73-147] 80 (08/30 0600) Resp:  [12-20] 20 (08/29 1929) BP: (97-112)/(75-91) 112/91 (08/30 0300) SpO2:  [90 %-97 %] 92 % (08/30 0600) Weight:  [131.7 kg] 131.7 kg (08/30 0600) Last BM Date : 10/29/22  Weight change: Filed Weights   11/05/22 1534 11/06/22 0444 11/07/22 0600  Weight: (!) 138.6 kg (!) 137.6 kg 131.7 kg    Intake/Output:   Intake/Output Summary (Last 24 hours) at 11/07/2022 0735 Last data filed at 11/07/2022 0600 Gross per 24 hour  Intake 1450.11 ml  Output 5400 ml  Net -3949.89 ml   CVP 5/6 Physical Exam  General:  well appearing.  No respiratory difficulty HEENT: normal Neck: supple. JVD ~6 cm. Carotids 2+ bilat; no bruits. No lymphadenopathy or thyromegaly appreciated. Cor: PMI nondisplaced. Regular rate & rhythm. No rubs, gallops or murmurs. Lungs: clear Abdomen: soft, nontender, nondistended. No hepatosplenomegaly. No bruits or masses. Good bowel sounds. Extremities: no cyanosis, clubbing, rash, trace BLE edema. +TED hose. PICC RUE Neuro: alert & oriented x 3, cranial nerves grossly intact. moves all 4 extremities  w/o difficulty. Affect pleasant.   Telemetry  NSR 80s (Personally reviewed)   EKG    No new EKG to review   Labs    CBC Recent Labs    11/05/22 0414 11/06/22 0435  WBC 8.3 10.7*  HGB 15.0 16.7  HCT 45.7 49.3  MCV 84.9 83.7  PLT 106* 116*   Basic Metabolic Panel Recent Labs    40/98/11 0435 11/07/22 0250  NA 128* 132*  K 3.3* 3.6  CL 92* 93*  CO2 24 28  GLUCOSE 179* 84  BUN 22* 28*  CREATININE 1.61* 1.89*  CALCIUM 8.3* 8.9  MG 2.1  --     BNP: BNP (last 3 results) Recent Labs    10/01/22 1020 10/15/22 0126 10/27/22 1049  BNP 822.7* 777.2* 1,723.3*      Imaging    No results found.   Medications:     Scheduled Medications:  amiodarone  400 mg Oral Daily   apixaban  5 mg Oral BID   Chlorhexidine Gluconate Cloth  6 each Topical Daily   digoxin  0.125 mg Oral Daily   docusate sodium  100 mg Oral Daily   DULoxetine  20 mg Oral Daily   empagliflozin  10 mg Oral Daily   insulin aspart  0-15 Units Subcutaneous TID WC   insulin aspart  0-5 Units Subcutaneous QHS   polyethylene glycol  17 g Oral Daily   potassium chloride  40 mEq Oral BID   sodium chloride  flush  10-40 mL Intracatheter Q12H   spironolactone  25 mg Oral Daily   torsemide  60 mg Oral BID    Infusions:  sodium chloride Stopped (11/05/22 0747)    PRN Medications: acetaminophen, docusate sodium, ondansetron (ZOFRAN) IV, mouth rinse, sodium chloride flush, witch hazel-glycerin  Patient Profile   57 y/o male w/ chronic systolic heart failure due to NICM, 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   Assessment/Plan  1. Acute on Chronic Systolic Heart Failure w/ Low-Output  - Echo 2011: EF 30-35%. Cath 2011 with normal cors. - Echo 10/17:  EF 35-40%. - CMRI 2017 LVEF 20%. No LGE - Echo 12/21 EF 40-45%. Coronary CTA normal cors, calcium score 0   - Cardiac PET 2023- no evidence for cardiac sarcoid or inflammatory. LVEF 38%.   - recent admit 8/24, developed CGS post DCCV. Echo EF 20-25% and RV moderately reduced - now readmitted w/ ADHF in setting of recurrent AFL w/ RVR. NYHA Class IIIB w/ low output   - RHC on 10/30/22 with: RA 21, PA 56/30 (42), PCWP 30 v waves to 55, Fick CO/CI 5.9/2.1, TD CO/CI 4.6/1.7. PAPi 1.2 - Volume status improved. CVP 5/6. Continue torsemide 60 mg twice a day. Got 2.5 metolazone yesterday evening, no further metolazone today. -5.4L UOP.  Weight down 13lbs, overall down 38lbs. Suspect he will need metolazone at home 1-2x/week.  - CO-OX 69%. Milrinone stopped 8/29 - Continue Spiro 25 mg daily - Continue Jardiance 10 mg daily - Place TED hose   2. Recurrent AFL w/ RVR  - failed multiple DCCVs as outlined above. Failing PO amiodarone  -  TSH elevated (8.9). Free T4 1.22. Free T3 1.5 - CPAP at bedtime  - Now off milrinone. In SR today. Continue Amio 400 mg twice a day (tolerating PO).  - Continue eliquis 5 mg twice a day.   3. AKI on CKD IIIB - likely cardiorenal, nonoliguric   - Previously creatinine baseline ~ 1.2. 1.89 today - Recent admit, SCr bumped to 2.2>>down to 1.9 at d/c  - SCr 2.35 on this admit, suspect low-output   4. Hypokalemia - in setting of AKI  - initial K in ER 6.0, treated w/ albuterol, calcium gluconate, and insulin + Lokelma x 2 - K 3.6 - Replace K    5. OSA  - Continue CPAP nightly.   6. ? PNA -  CXR w/ focal airspace opacity in the right mid lung field, new since prior exam. WBC down 10.7  - PCT 0.17  - Completed rocephin 8/25  7. IDA - Fe 22, T sat 8 - will need IV Fe. Will wait until infection resolves   8. Hyponatremia - Hypervolemic hyponatremia, Na 132.  Fluid restrict.   9. Type 2 DM - Continue SSI  10. Constipation - Mobilize today. Continue colace  and miralax - BM early this morning.   Ambulate. Suspect he may be ready to go home over the weekend. Will arrange f/u in AHF clinic.   Length of Stay: [address], [place]  11/07/2022, 7:35 AM  Advanced Heart Failure Team Pager [phone] (M-F; 7a - 5p)  Please contact [facility] for night-coverage after hours (5p -7a ) and weekends on [email or web address]  Patient seen with NP, agree  with the above note.   Excellent diuresis yesterday, CVP 6.  Co-ox 69%.  He is off milrinone.   Feels good, no dyspnea walking in hall.   Remains in NSR.   General: NAD Neck: No JVD, no thyromegaly or thyroid nodule.  Lungs: Clear to auscultation bilaterally with normal respiratory effort. CV: Nondisplaced PMI.  Heart regular S1/S2, no S3/S4, no murmur.  Trace ankle edema.  Abdomen: Soft, nontender, no hepatosplenomegaly, no distention.  Skin: Intact without lesions or rashes.  Neurologic: Alert and oriented x 3.  Psych: Normal affect. Extremities: No clubbing or cyanosis.  HEENT: Normal.   Good co-ox off milrinone, creatinine mildly higher at 1.89.  Continue digoxin.  Continue spironolactone 25 mg daily and Jardiance 10 mg daily.  Can start losartan low dose tomorrow if creatinine stable or trending down.  Good diuresis yesterday, weight down again.  He has been transitioned to torsemide 60 mg po bid. CVP 6.  Of note, responds very vigorously to metolazone.      He remains in NSR on amiodarone gtt. In the past, had trouble with amiodarone due to nausea with pills but so far doing fine with IV. Stop amiodarone gtt today with discontinuation of milrinone, start amiodarone 400 mg bid. Ideally, would get to AF/AFL ablation. If we cannot control AF/AFL or he cannot take amiodarone long-term, may have to consider AV nodal ablation-CRT, but currently holding NSR.  Continue apixaban.   Mobilize, hopefully home tomorrow.   Marca Ancona 11/07/2022 3:22 PM

## 2022-11-08 ENCOUNTER — Other Ambulatory Visit (HOSPITAL_COMMUNITY): Payer: Self-pay

## 2022-11-08 DIAGNOSIS — E875 Hyperkalemia: Secondary | ICD-10-CM | POA: Diagnosis not present

## 2022-11-08 LAB — GLUCOSE, CAPILLARY
Glucose-Capillary: 73 mg/dL (ref 70–99)
Glucose-Capillary: 69 mg/dL — ABNORMAL LOW (ref 70–99)
Glucose-Capillary: 114 mg/dL — ABNORMAL HIGH (ref 70–99)

## 2022-11-08 LAB — BASIC METABOLIC PANEL
Anion gap: 16 — ABNORMAL HIGH (ref 5–15)
BUN: 30 mg/dL — ABNORMAL HIGH (ref 6–20)
CO2: 26 mmol/L (ref 22–32)
Calcium: 8.7 mg/dL — ABNORMAL LOW (ref 8.9–10.3)
Chloride: 92 mmol/L — ABNORMAL LOW (ref 98–111)
Creatinine, Ser: 2 mg/dL — ABNORMAL HIGH (ref 0.61–1.24)
GFR, Estimated: 38 mL/min — ABNORMAL LOW (ref >60)
Glucose, Bld: 89 mg/dL (ref 70–99)
Potassium: 3 mmol/L — ABNORMAL LOW (ref 3.5–5.1)
Sodium: 134 mmol/L — ABNORMAL LOW (ref 135–145)

## 2022-11-08 LAB — COOXEMETRY PANEL
Carboxyhemoglobin: 3 % — ABNORMAL HIGH (ref 0.5–1.5)
Methemoglobin: 0.8 % (ref 0.0–1.5)
O2 Saturation: 84.7 %
Total hemoglobin: 18.7 g/dL — ABNORMAL HIGH (ref 12.0–16.0)

## 2022-11-08 LAB — DIGOXIN LEVEL: Digoxin Level: 0.2 ng/mL — ABNORMAL LOW (ref 0.8–2.0)

## 2022-11-08 MED ORDER — GLUCOSE 40 % PO GEL
1.0000 | ORAL | Status: AC
  Administered 2022-11-08: 31 g via ORAL
  Filled 2022-11-08: qty 1.21

## 2022-11-08 MED ORDER — POTASSIUM CHLORIDE CRYS ER 20 MEQ PO TBCR
40.0000 meq | EXTENDED_RELEASE_TABLET | ORAL | Status: AC
  Administered 2022-11-08 (×2): 40 meq via ORAL
  Filled 2022-11-08 (×2): qty 2

## 2022-11-08 MED ORDER — POTASSIUM CHLORIDE CRYS ER 20 MEQ PO TBCR
60.0000 meq | EXTENDED_RELEASE_TABLET | Freq: Two times a day (BID) | ORAL | 3 refills | Status: DC
  Filled 2022-11-08: qty 90, 15d supply, fill #0

## 2022-11-08 NOTE — Progress Notes (Signed)
   11/07/22 2345  BiPAP/CPAP/SIPAP  $ Non-Invasive Home Ventilator  Subsequent  BiPAP/CPAP/SIPAP Pt Type Adult  BiPAP/CPAP/SIPAP Resmed  Mask Type Nasal pillows  EPAP 6 cmH2O  Patient Home Equipment Yes  Auto Titrate No  Safety Check Completed by RT for Home Unit Yes, no issues noted

## 2022-11-08 NOTE — H&P (View-Only) (Signed)
Advanced Heart Failure Rounding Note  PCP-Cardiologist: Armanda Magic, MD   Subjective:   Echo 8/20: EF 25-30%, RV mod reduced. IVC dilated estimated RAP ~15  RHC 8/22 with: RA 21, PA 56/30 (42), PCWP 30 v waves to 55, Fick CO/CI 5.9/2.1, TD CO/CI 4.6/1.7. PAPi 1.2 8/22. Milrinone increased to 0.5 and lasix gtt started at 25mg /hr 8/26 Lasix gtt stopped 8/26, transitioned to PO Torsemide.  8/27 IV lasix restarted.  8/28 Milrinone weaned to 0.125 mcg.  8/29 Milrinone off  Feels good. Remains in AF.   Off milinrone. Co-ox ok.   Wants to go home   Objective:   Weight Range: 58.6 kg Body mass index is 15.32 kg/m.   Vital Signs:   Temp:  [97.8 F (36.6 C)-98.7 F (37.1 C)] 97.9 F (36.6 C) (08/31 0800) Pulse Rate:  [72-83] 72 (08/31 0800) Resp:  [17-20] 17 (08/31 0800) BP: (97-105)/(72-82) 98/80 (08/31 0800) SpO2:  [90 %-98 %] 92 % (08/31 0800) Weight:  [58.6 kg] 58.6 kg (08/31 0407) Last BM Date : 10/29/22  Weight change: Filed Weights   11/06/22 0444 11/07/22 0600 11/08/22 0407  Weight: (!) 137.6 kg 131.7 kg 58.6 kg    Intake/Output:   Intake/Output Summary (Last 24 hours) at 11/08/2022 1106 Last data filed at 11/08/2022 0800 Gross per 24 hour  Intake 450 ml  Output 4400 ml  Net -3950 ml    Physical Exam  General:  Sitting up in bed. No resp difficulty HEENT: normal Neck: supple. no JVD. Carotids 2+ bilat; no bruits. No lymphadenopathy or thryomegaly appreciated. Cor: PMI nondisplaced. Regular rate & rhythm. No rubs, gallops or murmurs. Lungs: clear Abdomen: soft, nontender, nondistended. No hepatosplenomegaly. No bruits or masses. Good bowel sounds. Extremities: no cyanosis, clubbing, rash, edema Neuro: alert & orientedx3, cranial nerves grossly intact. moves all 4 extremities w/o difficulty. Affect pleasant   Telemetry  NSR 70-80s (Personally reviewed)    Labs    CBC Recent Labs    11/06/22 0435  WBC 10.7*  HGB 16.7  HCT 49.3  MCV 83.7  PLT  116*   Basic Metabolic Panel Recent Labs    16/10/96 0435 11/07/22 0250 11/08/22 0519  NA 128* 132* 134*  K 3.3* 3.6 3.0*  CL 92* 93* 92*  CO2 24 28 26   GLUCOSE 179* 84 89  BUN 22* 28* 30*  CREATININE 1.61* 1.89* 2.00*  CALCIUM 8.3* 8.9 8.7*  MG 2.1  --   --     BNP: BNP (last 3 results) Recent Labs    10/01/22 1020 10/15/22 0126 10/27/22 1049  BNP 822.7* 777.2* 1,723.3*      Imaging    No results found.   Medications:     Scheduled Medications:  amiodarone  400 mg Oral BID   apixaban  5 mg Oral BID   Chlorhexidine Gluconate Cloth  6 each Topical Daily   digoxin  0.125 mg Oral Daily   docusate sodium  100 mg Oral Daily   DULoxetine  20 mg Oral Daily   empagliflozin  10 mg Oral Daily   insulin aspart  0-15 Units Subcutaneous TID WC   insulin aspart  0-5 Units Subcutaneous QHS   polyethylene glycol  17 g Oral Daily   potassium chloride  40 mEq Oral Daily   sodium chloride flush  10-40 mL Intracatheter Q12H   spironolactone  25 mg Oral Daily   torsemide  60 mg Oral BID    Infusions:  sodium chloride Stopped (11/05/22 0747)  PRN Medications: acetaminophen, docusate sodium, ondansetron (ZOFRAN) IV, mouth rinse, sodium chloride flush, witch hazel-glycerin  Patient Profile   57 y/o male w/ chronic systolic heart failure due to NICM, 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   Assessment/Plan  1. Acute on Chronic Systolic Heart Failure w/ Low-Output  - Echo 2011: EF 30-35%. Cath 2011 with normal cors. - Echo 10/17: EF 35-40%. - CMRI 2017 LVEF 20%. No LGE - Echo 12/21 EF 40-45%. Coronary CTA normal cors, calcium score 0   - Cardiac PET 2023- no evidence for cardiac sarcoid or inflammatory. LVEF 38%.   -  recent admit 8/24, developed CGS post DCCV. Echo EF 20-25% and RV moderately reduced - now readmitted w/ ADHF in setting of recurrent AFL w/ RVR. NYHA Class IIIB w/ low output   - RHC on 10/30/22 with: RA 21, PA 56/30 (42), PCWP 30 v waves to 55, Fick CO/CI 5.9/2.1, TD CO/CI 4.6/1.7. PAPi 1.2 - Volume status stable CVP 5/6. Continue torsemide 60 mg twice a day.Suspect he will need metolazone at home 1-2x/week.  - CO-OX 85%. Milrinone stopped 8/29 - Continue Spiro 25 mg daily - Continue Jardiance 10 mg daily - Place TED hose   2. Recurrent AFL w/ RVR  - failed multiple DCCVs as outlined above. Failing PO amiodarone  -  TSH elevated (8.9). Free T4 1.22. Free T3 1.5 - CPAP at bedtime  - Now off milrinone. In SR today. Continue Amio 400 mg twice a day (tolerating PO).  - Continue eliquis 5 mg twice a day.   3. AKI on CKD IIIB - likely cardiorenal, nonoliguric   - Previously creatinine baseline ~ 1.2. 1.89 today - Recent admit, SCr bumped to 2.2>>down to 1.9 at d/c  - SCr 2.35 on this admit, suspect low-output   4. Hypokalemia - in setting of AKI  - initial K in ER 6.0, treated w/ albuterol, calcium gluconate, and insulin + Lokelma x 2 - K 3.0 - Replace K    5. OSA  - Continue CPAP nightly.   6. ? PNA -  CXR w/ focal airspace opacity in the right mid lung field, new since prior exam. WBC down 10.7  - PCT 0.17  - Completed rocephin 8/25  7. IDA - Fe 22, T sat 8 - will need IV Fe. Will wait until infection resolves   8. Hyponatremia - Hypervolemic hyponatremia, Na 132.  Fluid restrict.   9. Type 2 DM - Continue SSI  10. Constipation - resolved  Ok for home today once K supped.    Length of Stay: 12  Arvilla Meres, MD  11/08/2022, 11:06 AM  Advanced Heart Failure Team Pager [phone] (M-F; 7a - 5p)  Please contact [facility] for night-coverage after hours (5p -7a ) and weekends on [email or web address]   11:06 AM

## 2022-11-08 NOTE — Progress Notes (Signed)
 PICC line removed per order and with no complications.  Pressure held to achieve hemostasis.  Vaseline/gauze/tegaderm applied.  Aftercare instructions reviewed with patient who verbalized understanding.

## 2022-11-08 NOTE — TOC Transition Note (Signed)
 Discharge medications are filled and stored in the [facility] awaiting patient discharge.

## 2022-11-08 NOTE — Plan of Care (Signed)
  Problem: Education: Goal: Knowledge of General Education information will improve Description: Including pain rating scale, medication(s)/side effects and non-pharmacologic comfort measures Outcome: Adequate for Discharge   Problem: Health Behavior/Discharge Planning: Goal: Ability to manage health-related needs will improve Outcome: Adequate for Discharge   Problem: Clinical Measurements: Goal: Ability to maintain clinical measurements within normal limits will improve Outcome: Adequate for Discharge Goal: Will remain free from infection Outcome: Adequate for Discharge Goal: Diagnostic test results will improve Outcome: Adequate for Discharge Goal: Respiratory complications will improve Outcome: Adequate for Discharge Goal: Cardiovascular complication will be avoided Outcome: Adequate for Discharge   Problem: Activity: Goal: Risk for activity intolerance will decrease Outcome: Adequate for Discharge   Problem: Nutrition: Goal: Adequate nutrition will be maintained Outcome: Adequate for Discharge   Problem: Coping: Goal: Level of anxiety will decrease Outcome: Adequate for Discharge   Problem: Elimination: Goal: Will not experience complications related to bowel motility Outcome: Adequate for Discharge Goal: Will not experience complications related to urinary retention Outcome: Adequate for Discharge   Problem: Pain Managment: Goal: General experience of comfort will improve Outcome: Adequate for Discharge   Problem: Safety: Goal: Ability to remain free from injury will improve Outcome: Adequate for Discharge   Problem: Skin Integrity: Goal: Risk for impaired skin integrity will decrease Outcome: Adequate for Discharge   Problem: Education: Goal: Understanding of CV disease, CV risk reduction, and recovery process will improve Outcome: Adequate for Discharge Goal: Individualized Educational Video(s) Outcome: Adequate for Discharge   Problem:  Activity: Goal: Ability to return to baseline activity level will improve Outcome: Adequate for Discharge   Problem: Cardiovascular: Goal: Ability to achieve and maintain adequate cardiovascular perfusion will improve Outcome: Adequate for Discharge Goal: Vascular access site(s) Level 0-1 will be maintained Outcome: Adequate for Discharge   Problem: Health Behavior/Discharge Planning: Goal: Ability to safely manage health-related needs after discharge will improve Outcome: Adequate for Discharge   Problem: Education: Goal: Ability to describe self-care measures that may prevent or decrease complications (Diabetes Survival Skills Education) will improve Outcome: Adequate for Discharge Goal: Individualized Educational Video(s) Outcome: Adequate for Discharge   Problem: Coping: Goal: Ability to adjust to condition or change in health will improve Outcome: Adequate for Discharge   Problem: Fluid Volume: Goal: Ability to maintain a balanced intake and output will improve Outcome: Adequate for Discharge   Problem: Health Behavior/Discharge Planning: Goal: Ability to identify and utilize available resources and services will improve Outcome: Adequate for Discharge Goal: Ability to manage health-related needs will improve Outcome: Adequate for Discharge   Problem: Metabolic: Goal: Ability to maintain appropriate glucose levels will improve Outcome: Adequate for Discharge   Problem: Nutritional: Goal: Maintenance of adequate nutrition will improve Outcome: Adequate for Discharge Goal: Progress toward achieving an optimal weight will improve Outcome: Adequate for Discharge   Problem: Skin Integrity: Goal: Risk for impaired skin integrity will decrease Outcome: Adequate for Discharge   Problem: Tissue Perfusion: Goal: Adequacy of tissue perfusion will improve Outcome: Adequate for Discharge

## 2022-11-08 NOTE — Progress Notes (Signed)
Advanced Heart Failure Rounding Note  PCP-Cardiologist: Armanda Magic, MD   Subjective:   Echo 8/20: EF 25-30%, RV mod reduced. IVC dilated estimated RAP ~15  RHC 8/22 with: RA 21, PA 56/30 (42), PCWP 30 v waves to 55, Fick CO/CI 5.9/2.1, TD CO/CI 4.6/1.7. PAPi 1.2 8/22. Milrinone increased to 0.5 and lasix gtt started at 25mg /hr 8/26 Lasix gtt stopped 8/26, transitioned to PO Torsemide.  8/27 IV lasix restarted.  8/28 Milrinone weaned to 0.125 mcg.  8/29 Milrinone off  Feels good. Remains in AF.   Off milinrone. Co-ox ok.   Wants to go home   Objective:   Weight Range: 58.6 kg Body mass index is 15.32 kg/m.   Vital Signs:   Temp:  [97.8 F (36.6 C)-98.7 F (37.1 C)] 97.9 F (36.6 C) (08/31 0800) Pulse Rate:  [72-83] 72 (08/31 0800) Resp:  [17-20] 17 (08/31 0800) BP: (97-105)/(72-82) 98/80 (08/31 0800) SpO2:  [90 %-98 %] 92 % (08/31 0800) Weight:  [58.6 kg] 58.6 kg (08/31 0407) Last BM Date : 10/29/22  Weight change: Filed Weights   11/06/22 0444 11/07/22 0600 11/08/22 0407  Weight: (!) 137.6 kg 131.7 kg 58.6 kg    Intake/Output:   Intake/Output Summary (Last 24 hours) at 11/08/2022 1106 Last data filed at 11/08/2022 0800 Gross per 24 hour  Intake 450 ml  Output 4400 ml  Net -3950 ml    Physical Exam  General:  Sitting up in bed. No resp difficulty HEENT: normal Neck: supple. no JVD. Carotids 2+ bilat; no bruits. No lymphadenopathy or thryomegaly appreciated. Cor: PMI nondisplaced. Regular rate & rhythm. No rubs, gallops or murmurs. Lungs: clear Abdomen: soft, nontender, nondistended. No hepatosplenomegaly. No bruits or masses. Good bowel sounds. Extremities: no cyanosis, clubbing, rash, edema Neuro: alert & orientedx3, cranial nerves grossly intact. moves all 4 extremities w/o difficulty. Affect pleasant   Telemetry  NSR 70-80s (Personally reviewed)    Labs    CBC Recent Labs    11/06/22 0435  WBC 10.7*  HGB 16.7  HCT 49.3  MCV 83.7  PLT  116*   Basic Metabolic Panel Recent Labs    16/10/96 0435 11/07/22 0250 11/08/22 0519  NA 128* 132* 134*  K 3.3* 3.6 3.0*  CL 92* 93* 92*  CO2 24 28 26   GLUCOSE 179* 84 89  BUN 22* 28* 30*  CREATININE 1.61* 1.89* 2.00*  CALCIUM 8.3* 8.9 8.7*  MG 2.1  --   --     BNP: BNP (last 3 results) Recent Labs    10/01/22 1020 10/15/22 0126 10/27/22 1049  BNP 822.7* 777.2* 1,723.3*      Imaging    No results found.   Medications:     Scheduled Medications:  amiodarone  400 mg Oral BID   apixaban  5 mg Oral BID   Chlorhexidine Gluconate Cloth  6 each Topical Daily   digoxin  0.125 mg Oral Daily   docusate sodium  100 mg Oral Daily   DULoxetine  20 mg Oral Daily   empagliflozin  10 mg Oral Daily   insulin aspart  0-15 Units Subcutaneous TID WC   insulin aspart  0-5 Units Subcutaneous QHS   polyethylene glycol  17 g Oral Daily   potassium chloride  40 mEq Oral Daily   sodium chloride flush  10-40 mL Intracatheter Q12H   spironolactone  25 mg Oral Daily   torsemide  60 mg Oral BID    Infusions:  sodium chloride Stopped (11/05/22 0747)  PRN Medications: acetaminophen, docusate sodium, ondansetron (ZOFRAN) IV, mouth rinse, sodium chloride flush, witch hazel-glycerin  Patient Profile   57 y/o male w/ chronic systolic heart failure due to NICM, 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   Assessment/Plan  1. Acute on Chronic Systolic Heart Failure w/ Low-Output  - Echo 2011: EF 30-35%. Cath 2011 with normal cors. - Echo 10/17: EF 35-40%. - CMRI 2017 LVEF 20%. No LGE - Echo 12/21 EF 40-45%. Coronary CTA normal cors, calcium score 0   - Cardiac PET 2023- no evidence for cardiac sarcoid or inflammatory. LVEF 38%.   -  recent admit 8/24, developed CGS post DCCV. Echo EF 20-25% and RV moderately reduced - now readmitted w/ ADHF in setting of recurrent AFL w/ RVR. NYHA Class IIIB w/ low output   - RHC on 10/30/22 with: RA 21, PA 56/30 (42), PCWP 30 v waves to 55, Fick CO/CI 5.9/2.1, TD CO/CI 4.6/1.7. PAPi 1.2 - Volume status stable CVP 5/6. Continue torsemide 60 mg twice a day.Suspect he will need metolazone at home 1-2x/week.  - CO-OX 85%. Milrinone stopped 8/29 - Continue Spiro 25 mg daily - Continue Jardiance 10 mg daily - Place TED hose   2. Recurrent AFL w/ RVR  - failed multiple DCCVs as outlined above. Failing PO amiodarone  -  TSH elevated (8.9). Free T4 1.22. Free T3 1.5 - CPAP at bedtime  - Now off milrinone. In SR today. Continue Amio 400 mg twice a day (tolerating PO).  - Continue eliquis 5 mg twice a day.   3. AKI on CKD IIIB - likely cardiorenal, nonoliguric   - Previously creatinine baseline ~ 1.2. 1.89 today - Recent admit, SCr bumped to 2.2>>down to 1.9 at d/c  - SCr 2.35 on this admit, suspect low-output   4. Hypokalemia - in setting of AKI  - initial K in ER 6.0, treated w/ albuterol, calcium gluconate, and insulin + Lokelma x 2 - K 3.0 - Replace K    5. OSA  - Continue CPAP nightly.   6. ? PNA -  CXR w/ focal airspace opacity in the right mid lung field, new since prior exam. WBC down 10.7  - PCT 0.17  - Completed rocephin 8/25  7. IDA - Fe 22, T sat 8 - will need IV Fe. Will wait until infection resolves   8. Hyponatremia - Hypervolemic hyponatremia, Na 132.  Fluid restrict.   9. Type 2 DM - Continue SSI  10. Constipation - resolved  Ok for home today once K supped.    Length of Stay: 12  Arvilla Meres, MD  11/08/2022, 11:06 AM  Advanced Heart Failure Team Pager [phone] (M-F; 7a - 5p)  Please contact [facility] for night-coverage after hours (5p -7a ) and weekends on [email or web address]   11:06 AM

## 2022-11-08 NOTE — Discharge Summary (Signed)
Advanced Heart Failure Team  Discharge Summary   Patient ID: Harold Park MRN: [id number], DOB/AGE: 1965-11-13 57 y.o. Admit date: 10/27/2022 D/C date:     11/08/2022   Primary Discharge Diagnoses:  Acute on chronic systolic heart failure with low-output -> cardiogenic shock Recurrent atrial fib/atrial flutter with RVR AKI on CKD IIIb Hypokalemia  Secondary Discharge Diagnoses:  OSA  PNA IDA Hyponatremia Type 2 DM Constipation  Hospital Course:  Harold Park is a 57 y.o. 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   He was recently admitted earlier in August with a fib RVR and A/C HFrEF. Developed shock during that admission, required multiple DCCVs. Converted to NSR on 8/15 post DCCV. Discharged with amiodarone taper and f/u with EP for possible ablation.   3 days after discharge he began to feel SOB and fatigued. Had been compliant with all his meds. Readmitted w/ acute on chronic CHF w/ low output in the setting of recurrent ALF w/ RVR. Started IV lasix BID and milrinone gtt. Underwent RHC 8/22 which showed RA 21, PCWP 30 with v waves to 55, CO/CI 5.9/2.1, PAPi 1.2. Milrinone gtt increased to 0.5 and started on lasix gtt. After these changes were made diuresis picked up tremendously. Responded very well to metolazone. Overall diuresed almost 40 lbs. Lasix and milrinone gtts were able to be weaned off and he was transitioned to PO Torsemide.   Has f/u with AHF clinic 9/11 and with EP 9/19 (discussed with EP personally). EP recommends 400 mg PO amiodarone for 7 days and then plan to continue at 200 mg BID until EP f/u.    Discharge Vitals: Blood pressure 98/80, pulse 72, temperature 97.9 F (36.6 C), temperature source Oral, resp. rate 17, height 6\' 5"  (1.956 m), weight 58.6 kg, SpO2  92%.  Labs: Lab Results  Component Value Date   WBC 10.7 (H) 11/06/2022   HGB 16.7 11/06/2022   HCT 49.3 11/06/2022   MCV 83.7 11/06/2022   PLT 116 (L) 11/06/2022    Recent Labs  Lab 11/02/22 0304 11/03/22 0540 11/08/22 0519  NA 133*   < > 134*  K 3.6   < > 3.0*  CL 91*   < > 92*  CO2 29   < > 26  BUN 25*   < > 30*  CREATININE 1.87*   < > 2.00*  CALCIUM 8.5*   < > 8.7*  PROT 6.0*  --   --   BILITOT 1.0  --   --   ALKPHOS 66  --   --   ALT 155*  --   --   AST 56*  --   --   GLUCOSE 224*   < > 89   < > = values in this interval not displayed.   Lab Results  Component Value Date   CHOL 158 02/02/2019   HDL 28 (L) 02/02/2019   LDLCALC 75 02/02/2019   TRIG 275 (H) 02/02/2019   BNP (last 3 results) Recent Labs    10/01/22 1020 10/15/22 0126 10/27/22 1049  BNP 822.7* 777.2* 1,723.3*    ProBNP (last 3 results) No results for input(s): "PROBNP" in the last 8760 hours.   Diagnostic Studies/Procedures   Echo 8/20: EF 25-30%, RV mod reduced. IVC dilated estimated RAP ~15   RHC 8/22 with: RA 21, PA 56/30 (42),  PCWP 30 v waves to 55, Fick CO/CI 5.9/2.1, TD CO/CI 4.6/1.7. PAPi 1.2  Discharge Medications   Allergies as of 11/08/2022       Reactions   Bee Venom Anaphylaxis   Amiodarone Nausea Only   Spironolactone         Medication List     STOP taking these medications    acetaminophen 500 MG tablet Commonly known as: TYLENOL   metoCLOPramide 5 MG tablet Commonly known as: Reglan   scopolamine 1 MG/3DAYS Commonly known as: TRANSDERM-SCOP   testosterone cypionate 200 MG/ML injection Commonly known as: DEPOTESTOSTERONE CYPIONATE       TAKE these medications    albuterol 108 (90 Base) MCG/ACT inhaler Commonly known as: VENTOLIN HFA TAKE 2 PUFFS BY MOUTH EVERY 6 HOURS AS NEEDED FOR WHEEZE OR SHORTNESS OF BREATH   amiodarone 200 MG tablet Commonly known as: PACERONE Take 2 tablets (400 mg total) by mouth 2 (two) times daily for 7 days (until  11/13/22), THEN 1 tablet (200 mg total) 2 (two) times daily thereafter Start taking on: November 07, 2022 What changed: See the new instructions.   BD Disp Needles 20G X 1-1/2" Misc Generic drug: NEEDLE (DISP) 20 G Use as directed   digoxin 0.125 MG tablet Commonly known as: LANOXIN Take 1 tablet (0.125 mg total) by mouth daily.   DULoxetine 20 MG capsule Commonly known as: CYMBALTA TAKE 1 CAPSULE BY MOUTH EVERY DAY   Eliquis 5 MG Tabs tablet Generic drug: apixaban TAKE 1 TABLET BY MOUTH TWICE A DAY   Jardiance 10 MG Tabs tablet Generic drug: empagliflozin Take 1 tablet (10 mg total) by mouth daily.   Magnesium 250 MG Tabs Take 1 tablet by mouth 2 (two) times daily.   NEEDLE (DISP) 22 G 22G X 1" Misc Use to inject testosterone into the muscle once every 14 days.   ondansetron 4 MG tablet Commonly known as: Zofran Take 1 tablet (4 mg total) by mouth every 8 (eight) hours as needed for nausea or vomiting.   potassium chloride SA 20 MEQ tablet Commonly known as: KLOR-CON M Take 3 tablets (60 mEq total) by mouth 2 (two) times daily. What changed:  how much to take how to take this when to take this additional instructions   spironolactone 25 MG tablet Commonly known as: ALDACTONE Take 1 tablet (25 mg total) by mouth daily.   torsemide 20 MG tablet Commonly known as: DEMADEX Take 3 tablets (60 mg total) by mouth 2 (two) times daily. What changed: how much to take   Zepbound 5 MG/0.5ML Pen Generic drug: tirzepatide Inject 5 mg into the skin once a week. Compounded at [employer] website, Patient is sent to a Pharmacy to get medication. July 23rd was the the last injection date.        Disposition   The patient will be discharged in stable condition to home.   Follow-up Information     [facility] Follow up on 11/19/2022.   Specialty: Cardiology Why: Follow up in the Advanced Heart Failure Clinic 9/11 at 12pm Entrance C,  free valet Contact information: [address] [place] [address] [phone]                  Duration of Discharge Encounter: Greater than 35 minutes   SignedArvilla Meres MD 11/08/2022, 11:22 AM

## 2022-11-10 ENCOUNTER — Encounter (HOSPITAL_BASED_OUTPATIENT_CLINIC_OR_DEPARTMENT_OTHER): Payer: Self-pay | Admitting: Cardiology

## 2022-11-11 ENCOUNTER — Telehealth: Payer: Self-pay

## 2022-11-11 NOTE — Transitions of Care (Post Inpatient/ED Visit) (Signed)
11/11/2022  Name: Harold Park MRN: [id number] DOB: [birth year]  Today's TOC FU Call Status: Today's TOC FU Call Status:: Successful TOC FU Call Completed TOC FU Call Complete Date: 11/11/22 Patient's Name and Date of Birth confirmed.  Transition Care Management Follow-up Telephone Call Date of Discharge: 11/08/22 Discharge Facility: [facility]) Type of Discharge: Inpatient Admission Primary Inpatient Discharge Diagnosis:: "hyperkalemia" How have you been since you were released from the hospital?: Better (Pt states he is doing well-denies any acute issues. Wgt today 290lbs, wgt yest 290lbs-denies any swelling,SOB or other sxs.) Any questions or concerns?: Yes Patient Questions/Concerns:: Patient wanting to know about daily fluid restrcition amount-staes he sent mssage to cardiology office-awaiting response. Patient Questions/Concerns Addressed: Other: (RN CM shared with pt response from cardiologist per [employer]. He voiced understanding.)  Items Reviewed: Did you receive and understand the discharge instructions provided?: Yes Medications obtained,verified, and reconciled?: Yes (Medications Reviewed) (pt states he took Metolazone pill today as he was concerned he may have been"drinking too much fluids'-med not listed on med list-states its old prescription-given last year-He will contact cardiology to discuss med & see if it is ok for him to take prn) Any new allergies since your discharge?: No Dietary orders reviewed?: Yes Type of Diet Ordered:: low salt/heart healthy Do you have support at home?: Yes People in Home: spouse Name of Support/Comfort Primary Source: Harold Park  Medications Reviewed Today: Medications Reviewed Today   Medications were not reviewed in this encounter     Home Care and Equipment/Supplies: Were Home Health Services Ordered?: NA Any new equipment or medical supplies ordered?: NA  Functional Questionnaire: Do you need assistance with  bathing/showering or dressing?: No Do you need assistance with meal preparation?: No Do you need assistance with eating?: No Do you have difficulty maintaining continence: No Do you need assistance with getting out of bed/getting out of a chair/moving?: No Do you have difficulty managing or taking your medications?: No  Follow up appointments reviewed: PCP Follow-up appointment confirmed?: Yes Date of PCP follow-up appointment?: 11/14/22 Follow-up Provider: Dr. Caryl Never Specialist [facility] Follow-up appointment confirmed?: Yes Date of Specialist follow-up appointment?: 11/19/22 Follow-Up Specialty Provider:: [facility] Do you need transportation to your follow-up appointment?: No (pt confirms he is able to drive himself to appts) Do you understand care options if your condition(s) worsen?: Yes-patient verbalized understanding  SDOH Interventions Today    Flowsheet Row Most Recent Value  SDOH Interventions   Food Insecurity Interventions Intervention Not Indicated  Transportation Interventions Intervention Not Indicated      TOC Interventions Today    Flowsheet Row Most Recent Value  TOC Interventions   TOC Interventions Discussed/Reviewed TOC Interventions Discussed, Arranged PCP follow up within 7 days/Care Guide scheduled      Interventions Today    Flowsheet Row Most Recent Value  Chronic Disease   Chronic disease during today's visit Congestive Heart Failure (CHF)  General Interventions   General Interventions Discussed/Reviewed General Interventions Discussed, Doctor Visits, Durable Medical Equipment (DME)  Doctor Visits Discussed/Reviewed Doctor Visits Discussed, PCP, Specialist  Durable Medical Equipment (DME) Other  [scale-pt reports he is weighing QAM-wgt today 290lbs, reviewd with pt when to alert MD of abnormal wgt gain  and/or s/s of fluid retention]  Education Interventions   Education Provided Provided Education  Provided Verbal Education On  Nutrition, When to see the doctor, Medication  Nutrition Interventions   Nutrition Discussed/Reviewed Nutrition Discussed, Adding fruits and vegetables, Increasing proteins, Fluid intake, Decreasing fats, Decreasing salt  Pharmacy Interventions   Pharmacy Dicussed/Reviewed Pharmacy Topics Discussed, Medications and their functions  Safety Interventions   Safety Discussed/Reviewed Safety Discussed       Alessandra Grout [facility] Care Management Care Management Community Coordinator Direct Phone: [phone] Toll Free: [phone] Fax: [phone]

## 2022-11-12 ENCOUNTER — Telehealth (HOSPITAL_COMMUNITY): Payer: Self-pay | Admitting: Cardiology

## 2022-11-12 NOTE — Telephone Encounter (Signed)
Patient aware of instructions.  

## 2022-11-12 NOTE — Telephone Encounter (Signed)
   Dear Harold Park  You are scheduled for a Cardioversion on Thursday, September 5 with Dr. Gala Romney.  Please arrive at the [facility] (Main Entrance A) at [facility]: [address] [place], [place] [address] at 11:30 AM (This time is 1.5 hour(s) before your procedure to ensure your preparation). Free valet parking service is available. You will check in at ADMITTING. The support person will be asked to wait in the waiting room.  It is OK to have someone drop you off and come back when you are ready to be discharged.     DIET:  Nothing to eat or drink after midnight except a sip of water with medications (see medication instructions below)  MEDICATION INSTRUCTIONS: !!IF ANY NEW MEDICATIONS ARE STARTED AFTER TODAY, PLEASE NOTIFY YOUR PROVIDER AS SOON AS POSSIBLE!!  FYI: Medications such as Semaglutide (Ozempic, [place]), Tirzepatide (Mounjaro, Zepbound), Dulaglutide (Trulicity), etc ("GLP1 agonists") AND Canagliflozin (Invokana), Dapagliflozin (Farxiga), Empagliflozin (Jardiance), Ertugliflozin (Ste[place]atro), Bexagliflozin [employer]) or any combination with one of these drugs such as Invokamet (Canagliflozin/Metformin), Synjardy (Empagliflozin/Metformin), etc ("SGLT2 inhibitors") must be held around the time of a procedure. This is not a comprehensive list of all of these drugs. Please review all of your medications and talk to your provider if you take any one of these. If you are not sure, ask your provider.         :1}HOLD: Empagliflozin (Jardiance) for 1 day prior to the procedure. Last dose on Wednesday, September 03.  Continue taking your anticoagulant (blood thinner): Apixaban (Eliquis).  You will need to continue this after your procedure until you are told by your provider that it is safe to stop.    LABS:pre procedure labs are not needed  FYI:  For your safety, and to allow [place] to monitor your vital signs accurately during the surgery/procedure we request: If you have  artificial nails, gel coating, SNS etc, please have those removed prior to your surgery/procedure. Not having the nail coverings /polish removed may result in cancellation or delay of your surgery/procedure.  You must have a responsible person to drive you home and stay in the waiting area during your procedure. Failure to do so could result in cancellation.  Bring your insurance cards.  *Special Note: Every effort is made to have your procedure done on time. Occasionally there are emergencies that occur at the hospital that may cause delays. Please be patient if a delay does occur.

## 2022-11-12 NOTE — Progress Notes (Signed)
Spoke to pt and instructed them to come at 1200 and to be NPO after 0000. Confirmed no missed doses of AC and instructed to take in AM with a small sip of water.   Confirmed that pt will have a ride home and someone to stay with them for 24 hours after the procedure. 

## 2022-11-12 NOTE — Telephone Encounter (Signed)
Patient left VM to report apple watch afib notification @ 1300. Reports he was napping shortly before.   Returned call to patient @ 1540 HR 113 during call -notification of afib no longer present @ 1540 -otherwise asymptomatic     Discharged 8/31 NSR Follow up with HF team 9/11 and EP 9/19 Reports medication compliance (No missed doses of eliquis or amio-?repeat DCCV)  Above reviewed with Dr Gala Romney Return 9/5 for DCCV   No pre cert required

## 2022-11-13 ENCOUNTER — Ambulatory Visit (HOSPITAL_COMMUNITY): Payer: BC Managed Care – PPO | Admitting: Certified Registered Nurse Anesthetist

## 2022-11-13 ENCOUNTER — Other Ambulatory Visit: Payer: Self-pay

## 2022-11-13 ENCOUNTER — Ambulatory Visit (HOSPITAL_COMMUNITY)
Admission: RE | Admit: 2022-11-13 | Discharge: 2022-11-13 | Disposition: A | Discharge status: Home / Self Care | Payer: BC Managed Care – PPO | Attending: Internal Medicine | Admitting: Internal Medicine

## 2022-11-13 ENCOUNTER — Encounter (HOSPITAL_COMMUNITY): Payer: Self-pay | Admitting: Internal Medicine

## 2022-11-13 ENCOUNTER — Encounter (HOSPITAL_COMMUNITY)
Admission: RE | Disposition: A | Discharge status: Home / Self Care | Payer: Self-pay | Source: Home / Self Care | Attending: Internal Medicine

## 2022-11-13 DIAGNOSIS — E1122 Type 2 diabetes mellitus with diabetic chronic kidney disease: Secondary | ICD-10-CM | POA: Insufficient documentation

## 2022-11-13 DIAGNOSIS — I428 Other cardiomyopathies: Secondary | ICD-10-CM | POA: Insufficient documentation

## 2022-11-13 DIAGNOSIS — E876 Hypokalemia: Secondary | ICD-10-CM | POA: Insufficient documentation

## 2022-11-13 DIAGNOSIS — I4891 Unspecified atrial fibrillation: Secondary | ICD-10-CM | POA: Insufficient documentation

## 2022-11-13 DIAGNOSIS — G4733 Obstructive sleep apnea (adult) (pediatric): Secondary | ICD-10-CM | POA: Diagnosis not present

## 2022-11-13 DIAGNOSIS — I5023 Acute on chronic systolic (congestive) heart failure: Secondary | ICD-10-CM | POA: Diagnosis not present

## 2022-11-13 DIAGNOSIS — N1832 Chronic kidney disease, stage 3b: Secondary | ICD-10-CM | POA: Insufficient documentation

## 2022-11-13 DIAGNOSIS — I4892 Unspecified atrial flutter: Secondary | ICD-10-CM | POA: Diagnosis not present

## 2022-11-13 DIAGNOSIS — E871 Hypo-osmolality and hyponatremia: Secondary | ICD-10-CM | POA: Diagnosis not present

## 2022-11-13 DIAGNOSIS — D509 Iron deficiency anemia, unspecified: Secondary | ICD-10-CM | POA: Diagnosis not present

## 2022-11-13 DIAGNOSIS — Z79899 Other long term (current) drug therapy: Secondary | ICD-10-CM | POA: Diagnosis not present

## 2022-11-13 DIAGNOSIS — N179 Acute kidney failure, unspecified: Secondary | ICD-10-CM | POA: Insufficient documentation

## 2022-11-13 DIAGNOSIS — I480 Paroxysmal atrial fibrillation: Secondary | ICD-10-CM | POA: Diagnosis not present

## 2022-11-13 DIAGNOSIS — Z7901 Long term (current) use of anticoagulants: Secondary | ICD-10-CM | POA: Diagnosis not present

## 2022-11-13 DIAGNOSIS — I110 Hypertensive heart disease with heart failure: Secondary | ICD-10-CM | POA: Diagnosis not present

## 2022-11-13 DIAGNOSIS — Z794 Long term (current) use of insulin: Secondary | ICD-10-CM | POA: Diagnosis not present

## 2022-11-13 DIAGNOSIS — I5022 Chronic systolic (congestive) heart failure: Secondary | ICD-10-CM | POA: Diagnosis not present

## 2022-11-13 HISTORY — PX: CARDIOVERSION: SHX1299

## 2022-11-13 LAB — POCT I-STAT, CHEM 8
BUN: 60 mg/dL — ABNORMAL HIGH (ref 6–20)
Calcium, Ion: 1.14 mmol/L — ABNORMAL LOW (ref 1.15–1.40)
Chloride: 96 mmol/L — ABNORMAL LOW (ref 98–111)
Creatinine, Ser: 2.1 mg/dL — ABNORMAL HIGH (ref 0.61–1.24)
Glucose, Bld: 132 mg/dL — ABNORMAL HIGH (ref 70–99)
HCT: 63 % — ABNORMAL HIGH (ref 39.0–52.0)
Hemoglobin: 21.4 g/dL (ref 13.0–17.0)
Potassium: 3.6 mmol/L (ref 3.5–5.1)
Sodium: 131 mmol/L — ABNORMAL LOW (ref 135–145)
TCO2: 23 mmol/L (ref 22–32)

## 2022-11-13 SURGERY — CARDIOVERSION
Anesthesia: General

## 2022-11-13 MED ORDER — PROPOFOL 10 MG/ML IV BOLUS
INTRAVENOUS | Status: DC | PRN
Start: 2022-11-13 — End: 2022-11-13
  Administered 2022-11-13: 100 mg via INTRAVENOUS

## 2022-11-13 MED ORDER — SODIUM CHLORIDE 0.9 % IV SOLN: INTRAVENOUS | Status: DC

## 2022-11-13 MED ORDER — LIDOCAINE 2% (20 MG/ML) 5 ML SYRINGE
INTRAMUSCULAR | Status: DC | PRN
  Administered 2022-11-13: 100 mg via INTRAVENOUS

## 2022-11-13 SURGICAL SUPPLY — 1 items: ELECT DEFIB PAD ADLT CADENCE (PAD) ×1 IMPLANT

## 2022-11-13 NOTE — CV Procedure (Signed)
    DIRECT CURRENT CARDIOVERSION  NAME:  Harold Park   MRN: [id number] DOB:  May 02, 1965   ADMIT DATE: 11/13/2022   INDICATIONS: Atrial flutter   PROCEDURE:   Informed consent was obtained prior to the procedure. The risks, benefits and alternatives for the procedure were discussed and the patient comprehended these risks. Once an appropriate time out was taken, the patient had the defibrillator pads placed in the anterior and posterior position. The patient then underwent sedation by the anesthesia service. Once an appropriate level of sedation was achieved, the patient received a single biphasic, synchronized 200J shock with prompt conversion to sinus rhythm. No apparent complications.  Arvilla Meres, MD  11:51 AM

## 2022-11-13 NOTE — Anesthesia Postprocedure Evaluation (Signed)
Anesthesia Post Note  Patient: Harold Park  Procedure(s) Performed: CARDIOVERSION     Patient location during evaluation: PACU Anesthesia Type: General Level of consciousness: awake Pain management: pain level controlled Vital Signs Assessment: post-procedure vital signs reviewed and stable Respiratory status: spontaneous breathing, nonlabored ventilation and respiratory function stable Cardiovascular status: blood pressure returned to baseline and stable Postop Assessment: no apparent nausea or vomiting Anesthetic complications: no   No notable events documented.  Last Vitals:  Vitals:   11/13/22 1205 11/13/22 1215  BP: 104/86 (!) 114/96  Pulse: 82 85  Resp: 15 15  Temp:    SpO2: 95% 95%    Last Pain:  Vitals:   11/13/22 1215  TempSrc:   PainSc: 0-No pain                 Linton Rump

## 2022-11-13 NOTE — Transfer of Care (Signed)
Immediate Anesthesia Transfer of Care Note  Patient: Harold Park  Procedure(s) Performed: CARDIOVERSION  Patient Location: Cath Lab  Anesthesia Type:MAC  Level of Consciousness: drowsy  Airway & Oxygen Therapy: Patient Spontanous Breathing and Patient connected to nasal cannula oxygen  Post-op Assessment: Report given to RN and Post -op Vital signs reviewed and stable  Post vital signs: Reviewed and stable, see cath lab post op VS flowsheet  Last Vitals:  Vitals Value Taken Time  BP    Temp    Pulse    Resp    SpO2      Last Pain:  Vitals:   11/13/22 1013  TempSrc: Temporal  PainSc: 0-No pain         Complications: No notable events documented.

## 2022-11-13 NOTE — Anesthesia Procedure Notes (Signed)
Procedure Name: MAC Date/Time: 11/13/2022 11:42 AM  Performed by: Waynard Edwards, CRNAPre-anesthesia Checklist: Patient identified, Emergency Drugs available, Suction available and Patient being monitored Patient Re-evaluated:Patient Re-evaluated prior to induction Oxygen Delivery Method: Nasal cannula Induction Type: IV induction Placement Confirmation: positive ETCO2 Dental Injury: Teeth and Oropharynx as per pre-operative assessment

## 2022-11-13 NOTE — Interval H&P Note (Signed)
History and Physical Interval Note:  11/13/2022 11:41 AM  Harold Park  has presented today for surgery, with the diagnosis of AFIB.  The various methods of treatment have been discussed with the patient and family. After consideration of risks, benefits and other options for treatment, the patient has consented to  Procedure(s): CARDIOVERSION (N/A) as a surgical intervention.  The patient's history has been reviewed, patient examined, no change in status, stable for surgery.  I have reviewed the patient's chart and labs.  Questions were answered to the patient's satisfaction.     Kyna Blahnik

## 2022-11-13 NOTE — Anesthesia Preprocedure Evaluation (Addendum)
Anesthesia Evaluation  Patient identified by MRN, date of birth, ID band Patient awake    Reviewed: Allergy & Precautions, NPO status , Patient's Chart, lab work & pertinent test results  History of Anesthesia Complications Negative for: history of anesthetic complications  Airway Mallampati: III  TM Distance: >3 FB Neck ROM: Full    Dental  (+) Dental Advisory Given   Pulmonary neg shortness of breath, sleep apnea and Continuous Positive Airway Pressure Ventilation , neg COPD, neg recent URI Chronic bronchitis, sarcoidosis   Pulmonary exam normal breath sounds clear to auscultation       Cardiovascular hypertension, pulmonary hypertension(-) angina +CHF (EF 25-30%, recent hospitalization for volume overload, diuresed >40 pounds)  (-) Past MI, (-) Cardiac Stents and (-) CABG + dysrhythmias Atrial Fibrillation + Valvular Problems/Murmurs (mild) MR  Rhythm:Regular Rate:Normal  RHC 10/30/2022: IMPRESSION: 1. Severely elevated pre and post capillary filling pressures.  2. Moderate elevated PA mean in the setting of volume overload.  3. Large V waves on PCWP tracing 2/2 volume overload, noncompliant LA and functional MR.  4. Hemodynamic parameters consistent with suboptimal RV function.   TTE 10/28/2022: IMPRESSIONS     1. Left ventricular ejection fraction, by estimation, is 25 to 30%. The  left ventricle has severely decreased function. The left ventricle  demonstrates global hypokinesis. The left ventricular internal cavity size  was severely dilated. Left ventricular  diastolic function could not be evaluated.   2. Right ventricular systolic function is moderately reduced. The right  ventricular size is normal. Tricuspid regurgitation signal is inadequate  for assessing PA pressure.   3. Left atrial size was severely dilated.   4. Right atrial size was mild to moderately dilated.   5. The mitral valve is degenerative. Mild  mitral valve regurgitation. No  evidence of mitral stenosis.   6. The aortic valve is normal in structure. Aortic valve regurgitation is  not visualized. No aortic stenosis is present.   7. The inferior vena cava is dilated in size with <50% respiratory  variability, suggesting right atrial pressure of 15 mmHg.      Neuro/Psych  PSYCHIATRIC DISORDERS  Depression    negative neurological ROS     GI/Hepatic negative GI ROS, Neg liver ROS,,,  Endo/Other  negative endocrine ROS    Renal/GU negative Renal ROS     Musculoskeletal   Abdominal  (+) + obese  Peds  Hematology  (+) Blood dyscrasia (secondary polycythemia)   Anesthesia Other Findings Last Zepbound: >2 months ago  Last Eliquis: this morning  Reproductive/Obstetrics                             Anesthesia Physical Anesthesia Plan  ASA: 4  Anesthesia Plan: General   Post-op Pain Management:    Induction: Intravenous  PONV Risk Score and Plan: TIVA and Treatment may vary due to age or medical condition  Airway Management Planned: Natural Airway and Nasal Cannula  Additional Equipment:   Intra-op Plan:   Post-operative Plan:   Informed Consent: I have reviewed the patients History and Physical, chart, labs and discussed the procedure including the risks, benefits and alternatives for the proposed anesthesia with the patient or authorized representative who has indicated his/her understanding and acceptance.       Plan Discussed with: Anesthesiologist and CRNA  Anesthesia Plan Comments: (Risks of general anesthesia discussed including, but not limited to, sore throat, hoarse voice, chipped/damaged teeth, injury to vocal cords,  nausea and vomiting, allergic reactions, lung infection, heart attack, stroke, and death. All questions answered. )        Anesthesia Quick Evaluation

## 2022-11-13 NOTE — Discharge Instructions (Signed)

## 2022-11-14 ENCOUNTER — Ambulatory Visit (INDEPENDENT_AMBULATORY_CARE_PROVIDER_SITE_OTHER): Payer: BC Managed Care – PPO | Admitting: Family Medicine

## 2022-11-14 ENCOUNTER — Encounter (HOSPITAL_COMMUNITY): Payer: Self-pay | Admitting: Internal Medicine

## 2022-11-14 ENCOUNTER — Encounter (HOSPITAL_BASED_OUTPATIENT_CLINIC_OR_DEPARTMENT_OTHER): Payer: Self-pay | Admitting: Cardiology

## 2022-11-14 VITALS — BP 124/86 | HR 90 | Temp 98.2°F | Ht 77.0 in | Wt 282.1 lb

## 2022-11-14 DIAGNOSIS — Z23 Encounter for immunization: Secondary | ICD-10-CM | POA: Diagnosis not present

## 2022-11-14 DIAGNOSIS — I480 Paroxysmal atrial fibrillation: Secondary | ICD-10-CM

## 2022-11-14 DIAGNOSIS — I5022 Chronic systolic (congestive) heart failure: Secondary | ICD-10-CM | POA: Diagnosis not present

## 2022-11-14 DIAGNOSIS — E876 Hypokalemia: Secondary | ICD-10-CM | POA: Diagnosis not present

## 2022-11-14 DIAGNOSIS — D751 Secondary polycythemia: Secondary | ICD-10-CM | POA: Diagnosis not present

## 2022-11-14 LAB — BASIC METABOLIC PANEL
BUN: 64 mg/dL — ABNORMAL HIGH (ref 6–23)
CO2: 29 meq/L (ref 19–32)
Calcium: 9.8 mg/dL (ref 8.4–10.5)
Chloride: 88 meq/L — ABNORMAL LOW (ref 96–112)
Creatinine, Ser: 2.01 mg/dL — ABNORMAL HIGH (ref 0.40–1.50)
GFR: 35.9 mL/min — ABNORMAL LOW (ref >60.00)
Glucose, Bld: 124 mg/dL — ABNORMAL HIGH (ref 70–99)
Potassium: 3.6 meq/L (ref 3.5–5.1)
Sodium: 130 mEq/L — ABNORMAL LOW (ref 135–145)

## 2022-11-14 NOTE — Progress Notes (Unsigned)
Established Patient Office Visit  Subjective   Patient ID: Harold Park, male    DOB: 05-22-1965  Age: 57 y.o. MRN: [id number]  Chief Complaint  Patient presents with   Hospitalization Follow-up    HPI  {History (Optional):23778} Heart has history of A-fib, chronic systolic heart failure secondary to nonischemic cardiomyopathy, hypertension, obstructive sleep apnea, history of low testosterone, reported sarcoidosis.   He was admitted much of the month of August secondary to acute on chronic systolic heart failure and cardiogenic shock.  [place] and developed symptoms of progressive fatigue and dyspnea then.  This was just prior to his first admission.  He has long history of systolic heart failure secondary to nonischemic cardiomyopathy and difficult to control atrial fibrillation with multiple prior failed cardioversions.  He has obstructive sleep apnea and uses CPAP and also chronic kidney disease.  Just a few days after he was discharged from his first admission he developed some increased shortness of breath and fatigue.  Have been compliant with all of his medications.  Was readmitted with acute on chronic heart failure in the setting of atrial fibrillation with rapid ventricular response.  Was diuresed and underwent right heart catheterization.  Was treated with milrinone, Lasix, metolazone.  Diuresed approximately 40 pounds.  Eventually transition to oral torsemide.  Plan is to hopefully get him in for ablation if his EF stabilizes.  He states his discharge weight was about 284 pounds and 282 pounds today.  He is been more diligent with diet recently especially with sodium intake.  He feels like he is back to baseline at this point regarding his respiratory status.  No orthopnea.  Some chronic exertional dyspnea.  No recent chest pains.  He had normal coronaries on cath 2011.  Has not had flu vaccine.  Also no history of documented  pneumonia vaccine.  He has had some mildly elevated blood sugars with recent A1c 6.4%.  Does take Jardiance for his heart failure along with several other medications including digoxin, Aldactone, torsemide.  He remains on Eliquis.  Did have some hypokalemia with discharge potassium 3.0.  Is taking potassium supplement regularly  Past Medical History:  Diagnosis Date   Chronic bronchitis    HTN (hypertension)    NICM (nonischemic cardiomyopathy) (HCC)    Obesity (BMI 30-39.9) 12/04/2015   OSA (obstructive sleep apnea) 08/19/2015   Severe with AHI 64/hr now on CPAP at 13cm H2O   PAF (paroxysmal atrial fibrillation) (HCC)    Systolic CHF, chronic (HCC)    Past Surgical History:  Procedure Laterality Date   APPENDECTOMY     BRONCHIAL WASHINGS  04/24/2020   Procedure: BRONCHIAL WASHINGS;  Surgeon: Leslye Peer, MD;  Location: [facility] ENDOSCOPY;  Service: Pulmonary;;   CARDIAC CATHETERIZATION     CARDIOVERSION N/A 06/19/2017   Procedure: CARDIOVERSION;  Surgeon: Dolores Patty, MD;  Location: [facility] ENDOSCOPY;  Service: Cardiovascular;  Laterality: N/A;   CARDIOVERSION N/A 10/02/2022   Procedure: CARDIOVERSION;  Surgeon: Dolores Patty, MD;  Location: [facility] INVASIVE CV LAB;  Service: Cardiovascular;  Laterality: N/A;   CARDIOVERSION N/A 10/20/2022   Procedure: CARDIOVERSION;  Surgeon: Dolores Patty, MD;  Location: [facility] INVASIVE CV LAB;  Service: Cardiovascular;  Laterality: N/A;   CARDIOVERSION N/A 10/23/2022   Procedure: CARDIOVERSION;  Surgeon: Dolores Patty, MD;  Location: [facility] INVASIVE CV LAB;  Service: Cardiovascular;  Laterality: N/A;   CARDIOVERSION N/A 11/13/2022   Procedure: CARDIOVERSION;  Surgeon: Dolores Patty, MD;  Location: [facility] INVASIVE CV LAB;  Service: Cardiovascular;  Laterality: N/A;   FINE NEEDLE ASPIRATION  04/24/2020   Procedure: FINE NEEDLE ASPIRATION (FNA) LINEAR;  Surgeon: Leslye Peer, MD;  Location: [facility] ENDOSCOPY;  Service: Pulmonary;;   LIPOMA RESECTION      arms   RIGHT HEART CATH N/A 10/30/2022   Procedure: RIGHT HEART CATH;  Surgeon: Dorthula Nettles, DO;  Location: [facility] INVASIVE CV LAB;  Service: Cardiovascular;  Laterality: N/A;   SPLENECTOMY     SPLIT NIGHT STUDY  08/12/2015   VIDEO BRONCHOSCOPY WITH ENDOBRONCHIAL ULTRASOUND N/A 04/24/2020   Procedure: VIDEO BRONCHOSCOPY WITH ENDOBRONCHIAL ULTRASOUND;  Surgeon: Leslye Peer, MD;  Location: [facility] ENDOSCOPY;  Service: Pulmonary;  Laterality: N/A;    reports that he has never smoked. He has never used smokeless tobacco. He reports current alcohol use. He reports that he does not use drugs. family history includes Breast cancer in his mother; Cancer in his father; Heart disease in an other family member; Hypertension in an other family member. Allergies  Allergen Reactions   Bee Venom Anaphylaxis   Amiodarone Nausea Only   Spironolactone     Review of Systems  Eyes:  Negative for blurred vision.  Respiratory:  Negative for cough, hemoptysis and wheezing.   Cardiovascular:  Negative for chest pain.  Neurological:  Negative for dizziness, weakness and headaches.      Objective:     BP 124/86 (BP Location: Right Arm, Patient Position: Sitting, Cuff Size: Normal)   Pulse 90   Temp 98.2 F (36.8 C) (Oral)   Ht 6\' 5"  (1.956 m)   Wt 282 lb 1.6 oz (128 kg)   SpO2 98%   BMI 33.45 kg/m  {Vitals History (Optional):23777}  Physical Exam Vitals reviewed.  Constitutional:      Appearance: Normal appearance.  Cardiovascular:     Rate and Rhythm: Normal rate and regular rhythm.  Pulmonary:     Effort: Pulmonary effort is normal.     Breath sounds: No wheezing or rales.  Musculoskeletal:     Right lower leg: No edema.     Left lower leg: No edema.  Neurological:     Mental Status: He is alert.      No results found for any visits on 11/14/22.  {Labs (Optional):23779}  The ASCVD Risk score (Arnett DK, et al., 2019) failed to calculate for the following reasons:   Cannot find  a previous HDL lab   Cannot find a previous total cholesterol lab    Assessment & Plan:   Problem List Items Addressed This Visit   None Visit Diagnoses     Hypokalemia    -  Primary   Relevant Orders   Basic metabolic panel   Need for influenza vaccination       Relevant Orders   Flu vaccine trivalent PF, 6mos and older(Flulaval,Afluria,Fluarix,Fluzone) (Completed)   Need for pneumococcal vaccination       Relevant Orders   Pneumococcal conjugate vaccine 20-valent (Prevnar 20) (Completed)       Return in about 3 months (around 02/13/2023).    Evelena Peat, MD

## 2022-11-19 ENCOUNTER — Ambulatory Visit (HOSPITAL_COMMUNITY)
Admit: 2022-11-19 | Discharge: 2022-11-19 | Disposition: A | Discharge status: Home / Self Care | Payer: BC Managed Care – PPO | Source: Ambulatory Visit | Attending: Physician Assistant | Admitting: Physician Assistant

## 2022-11-19 ENCOUNTER — Encounter (HOSPITAL_COMMUNITY): Payer: Self-pay

## 2022-11-19 VITALS — BP 102/74 | HR 90 | Wt 293.2 lb

## 2022-11-19 DIAGNOSIS — Z6834 Body mass index (BMI) 34.0-34.9, adult: Secondary | ICD-10-CM | POA: Insufficient documentation

## 2022-11-19 DIAGNOSIS — I480 Paroxysmal atrial fibrillation: Secondary | ICD-10-CM | POA: Diagnosis not present

## 2022-11-19 DIAGNOSIS — R9431 Abnormal electrocardiogram [ECG] [EKG]: Secondary | ICD-10-CM | POA: Insufficient documentation

## 2022-11-19 DIAGNOSIS — I428 Other cardiomyopathies: Secondary | ICD-10-CM | POA: Diagnosis not present

## 2022-11-19 DIAGNOSIS — Z597 Insufficient social insurance and welfare support: Secondary | ICD-10-CM | POA: Diagnosis not present

## 2022-11-19 DIAGNOSIS — I5022 Chronic systolic (congestive) heart failure: Secondary | ICD-10-CM | POA: Insufficient documentation

## 2022-11-19 DIAGNOSIS — N1832 Chronic kidney disease, stage 3b: Secondary | ICD-10-CM | POA: Insufficient documentation

## 2022-11-19 DIAGNOSIS — G4733 Obstructive sleep apnea (adult) (pediatric): Secondary | ICD-10-CM | POA: Insufficient documentation

## 2022-11-19 DIAGNOSIS — I4892 Unspecified atrial flutter: Secondary | ICD-10-CM | POA: Diagnosis not present

## 2022-11-19 DIAGNOSIS — I130 Hypertensive heart and chronic kidney disease with heart failure and stage 1 through stage 4 chronic kidney disease, or unspecified chronic kidney disease: Secondary | ICD-10-CM | POA: Diagnosis not present

## 2022-11-19 DIAGNOSIS — N179 Acute kidney failure, unspecified: Secondary | ICD-10-CM | POA: Insufficient documentation

## 2022-11-19 DIAGNOSIS — E1122 Type 2 diabetes mellitus with diabetic chronic kidney disease: Secondary | ICD-10-CM | POA: Insufficient documentation

## 2022-11-19 DIAGNOSIS — E669 Obesity, unspecified: Secondary | ICD-10-CM | POA: Diagnosis not present

## 2022-11-19 DIAGNOSIS — E6609 Other obesity due to excess calories: Secondary | ICD-10-CM

## 2022-11-19 NOTE — Progress Notes (Addendum)
Advanced Heart Failure Clinic Note  PCP: Dr. Caryl Never  Primary Cardiologist: Dr. Gala Romney  HPI: 57 y.o. 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   Echo 2011: EF 30-35%. Cath 2011 with normal cors.   Echo 10/17: EF 35-40%.   We saw him for the first time in May 2017.   Underwent DC-CV on 06/19/17 for recurrent AF Seen in AF Clinic on 07/02/17. Felt not to be ideal candidate for AF ablation   Echo 12/21 EF 40-45%   CT angio in 2019 aortic root was 4.6 cm   CT chest 03/06/20 to evaluate hilar fullness seen on CXR. Interval development of numerous enlarged mediastinal and bilateral hilar lymph nodes. Underwent bronchoscopy with biopsy of mediastinal nodes. 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 Treated with prednisone.    CT coronary 03/06/20. Calcium score 0. Poor arterial imaging but no obvious CAD   Back in AFL at f/u 09/29/22. Diuretics increased. Underwent DCCV to SR on 07/25. Seen by Afib clinic on 08/02 and in AFL with RVR. Ranexa stopped in anticipation of Tikosyn loading.   Presented 10/14/22  with AF with RVR. He was sedated and underwent cardioversion in the ED .  Following cardioversion he developed shock. Transferred to [facility] for admit . Bedside POCUS with EF 15%. Echo with EF 25-30%, RV moderately down. Had recurrent Afib with RVR and PVCs. DCCV attempted X 2 on 08/11. Amiodarone load resumed and had repeat cardioversion on 10/23/22 with conversion to SR.   Readmitted 10/27/22 with recurrent AFL with RVR and acute on chronic CHF with low-output. Converted to SR with IV amiodarone. He required inotrope support with milrinone. RHC on milrinone with elevated filling pressures and low-output Fick CI 2.1 and TD CI 1.7. He was diuresed with IV lasix and metolazone. Course further complicated by AKI on CKD and PNA for which he was treated with antibiotics.   Had recurrent atrial flutter and underwent DCCV to SR on 09/05.   He is  here today for f/u. He has been doing well since discharge. Home weight stable at 285 lb. He has not detected any atrial fibrillation on his apple watch. No dyspnea, orthopnea, PND or lower extremity edema. He has been limiting fluid and sodium intake. Taking all medications as prescribed.   ROS: All systems negative except as listed in HPI, PMH and Problem List.  SH:  Social History   Socioeconomic History   Marital status: Married    Spouse name: Not on file   Number of children: 3   Years of education: Not on file   Highest education level: Not on file  Occupational History   Occupation: realtor  Tobacco Use   Smoking status: Never   Smokeless tobacco: Never  Vaping Use   Vaping status: Never Used  Substance and Sexual Activity   Alcohol use: Yes    Comment: occasional   Drug use: Never   Sexual activity: Not on file  Other Topics Concern   Not on file  Social History Narrative   Not on file   Social Determinants of Health   Financial Resource Strain: Not on file  Food Insecurity: No Food Insecurity (11/11/2022)   Hunger Vital Sign    Worried About Running Out of Food in the Last Year: Never true    Ran Out of Food in the Last Year: Never true  Transportation Needs: No Transportation Needs (11/11/2022)   PRAPARE - Transportation  Lack of Transportation (Medical): No    Lack of Transportation (Non-Medical): No  Physical Activity: Not on file  Stress: Not on file  Social Connections: Not on file  Intimate Partner Violence: Not At Risk (11/05/2022)   Humiliation, Afraid, Rape, and Kick questionnaire    Fear of Current or Ex-Partner: No    Emotionally Abused: No    Physically Abused: No    Sexually Abused: No    FH:  Family History  Problem Relation Age of Onset   Breast cancer Mother    Cancer Father        blood cancer   Hypertension Other    Heart disease Other    Stomach cancer Neg Hx    Colon cancer Neg Hx    Esophageal cancer Neg Hx    Pancreatic  cancer Neg Hx    Rectal cancer Neg Hx     Past Medical History:  Diagnosis Date   Chronic bronchitis    HTN (hypertension)    NICM (nonischemic cardiomyopathy) (HCC)    Obesity (BMI 30-39.9) 12/04/2015   OSA (obstructive sleep apnea) 08/19/2015   Severe with AHI 64/hr now on CPAP at 13cm H2O   PAF (paroxysmal atrial fibrillation) (HCC)    Systolic CHF, chronic (HCC)     Current Outpatient Medications  Medication Sig Dispense Refill   albuterol (VENTOLIN HFA) 108 (90 Base) MCG/ACT inhaler TAKE 2 PUFFS BY MOUTH EVERY 6 HOURS AS NEEDED FOR WHEEZE OR SHORTNESS OF BREATH 8.5 each 2   amiodarone (PACERONE) 200 MG tablet Take 2 tablets (400 mg total) by mouth 2 (two) times daily for 7 days (until 11/13/22), THEN 1 tablet (200 mg total) 2 (two) times daily thereafter 70 tablet 6   digoxin (LANOXIN) 0.125 MG tablet Take 1 tablet (0.125 mg total) by mouth daily. 30 tablet 5   DULoxetine (CYMBALTA) 20 MG capsule TAKE 1 CAPSULE BY MOUTH EVERY DAY 90 capsule 11   ELIQUIS 5 MG TABS tablet TAKE 1 TABLET BY MOUTH TWICE A DAY 60 tablet 6   empagliflozin (JARDIANCE) 10 MG TABS tablet Take 1 tablet (10 mg total) by mouth daily. 30 tablet 5   Magnesium 250 MG TABS Take 1 tablet by mouth every evening.     ondansetron (ZOFRAN) 4 MG tablet Take 1 tablet (4 mg total) by mouth every 8 (eight) hours as needed for nausea or vomiting. 60 tablet 1   potassium chloride SA (KLOR-CON M) 20 MEQ tablet Take 3 tablets (60 mEq total) by mouth 2 (two) times daily. 90 tablet 3   spironolactone (ALDACTONE) 25 MG tablet Take 1 tablet (25 mg total) by mouth daily. 30 tablet 5   torsemide (DEMADEX) 20 MG tablet Take 3 tablets (60 mg total) by mouth 2 (two) times daily. 270 tablet 5   tirzepatide (ZEPBOUND) 5 MG/0.5ML Pen Inject 5 mg into the skin once a week. Compounded at [employer] website, Patient is sent to a Pharmacy to get medication. July 23rd was the the last injection date. (Patient not taking: Reported on 11/19/2022)      No current facility-administered medications for this encounter.    Vitals:   11/19/22 1220  BP: 102/74  Pulse: 90  SpO2: 95%  Weight: 133 kg (293 lb 3.2 oz)    PHYSICAL EXAM:  General:  Well appearing.  HEENT: normal Neck: supple. No JVD Cor: PMI normal. Regular rate & rhythm. No rubs, gallops or murmurs. Lungs: clear Abdomen: soft, nontender, nondistended.  Extremities: no cyanosis, clubbing, rash,  edema Neuro: alert & orientedx3. Affect pleasant.   ECG: Sinus rhythm with 2st degree AVB, 93 bpm   ASSESSMENT & PLAN:  1. Chronic Systolic Heart Failure  - Echo 2011: EF 30-35%. Cath 2011 with normal cors. - Echo 10/17: EF 35-40%. - CMRI 2017 LVEF 20%. No LGE - Echo 12/21 EF 40-45%. Coronary CTA normal cors, calcium score 0   - Cardiac PET 2023- no evidence for cardiac sarcoid or inflammatory. LVEF 38%.   - recent admit 10/14/22, developed cardiogenic shock post DCCV. Echo EF 20-25% and RV moderately reduced - Readmitted 10/27/22 w/ ADHF w/ low-output in setting of recurrent AFL w/ RVR. Required inotrope support with milrinone - RHC on 10/30/22 with: RA 21, PA 56/30 (42), PCWP 30 v waves to 55, Fick CO/CI 5.9/2.1, TD CO/CI 4.6/1.7. PAPi 1.2 - Volume looks good. Home weight stable 285 lb. Continue Torsemide 60 BID. Can use extra 40 mg Torsemide PRN. May eventually need intermittent metolazone.  - Continue Spiro 25 mg daily - Continue Jardiance 10 mg daily - Continue digoxin 0.125 mg daily, dig level 0.2 on 08/31 - No beta blocker with recent low-output - BP soft. Consider low dose Losartan next visit if renal function stable. - Repeat echo in a couple of months if maintaining SR   2. Recurrent AFL and Afib with RVR - Now on po amiodarone 400 mg BID (he did not decrease dose on 09/05, we instructed him to stay on higher dose). SR today. TSH 8.9, Free T3 1.5 and Free T4 1.2 in 08/24. Recheck thyroid function studies at f/u. - Multiple admissions with RVR leading to  decompensated HF w/ low-output - Has f/u with EP next week. There is concern that Afib ablation may not be successful. Considering AVJ ablation and BiV ICD - Continue anticoagulation with Eliquis   3. AKI on CKD IIIB - Previously creatinine baseline ~ 1.2, now low 2s after recent admissions with low-output HF - Scr 2.01 on 09/06 - Continue Jardiance   4. OSA  - Continue CPAP nightly.    5. Type 2 DM - A1c 6.4%.  - Continue Jardiance  6. Obesity -Had been on Terzepatide but stopped d/t cost (hadn't met deductible). He is interested in restarting the medication now that deductible is met.  -Will refer back to Pharmacy.  Follow-up: 4 weeks with APP

## 2022-11-19 NOTE — Patient Instructions (Addendum)
Thank you for coming in today  If you had labs drawn today, any labs that are abnormal the clinic will call you No news is good news  Medications: No changes  Follow up appointments:  Your physician recommends that you schedule a follow-up appointment in:  4 weeks in clinic   Do the following things EVERYDAY: Weigh yourself in the morning before breakfast. Write it down and keep it in a log. Take your medicines as prescribed Eat low salt foods--Limit salt (sodium) to 2000 mg per day.  Stay as active as you can everyday Limit all fluids for the day to less than 2 liters   At the Advanced [facility] Failure Clinic, you and your health needs are our priority. As part of our continuing mission to provide you with exceptional heart care, we have created designated Provider Care Teams. These Care Teams include your primary Cardiologist (physician) and Advanced Practice Providers (APPs- Physician Assistants and Nurse Practitioners) who all work together to provide you with the care you need, when you need it.   You may see any of the following providers on your designated Care Team at your next follow up: Dr Arvilla Meres Dr Marca Ancona Dr. Marcos Eke, NP Robbie Lis, [place] [facility] [place], [place] Brynda Peon, NP Karle Plumber, PharmD   Please be sure to bring in all your medications bottles to every appointment.    Thank you for choosing [facility]  If you have any questions or concerns before your next appointment please send [place] a message through [facility] or call our office at [phone].    TO LEAVE A MESSAGE FOR THE NURSE SELECT OPTION 2, PLEASE LEAVE A MESSAGE INCLUDING: YOUR NAME DATE OF BIRTH CALL BACK NUMBER REASON FOR CALL**this is important as we prioritize the call backs  YOU WILL RECEIVE A CALL BACK THE SAME DAY AS LONG AS YOU CALL BEFORE 4:00 PM

## 2022-11-20 ENCOUNTER — Encounter (HOSPITAL_BASED_OUTPATIENT_CLINIC_OR_DEPARTMENT_OTHER): Payer: Self-pay | Admitting: Cardiology

## 2022-11-20 DIAGNOSIS — I5022 Chronic systolic (congestive) heart failure: Secondary | ICD-10-CM

## 2022-11-20 DIAGNOSIS — I480 Paroxysmal atrial fibrillation: Secondary | ICD-10-CM

## 2022-11-21 ENCOUNTER — Other Ambulatory Visit (HOSPITAL_COMMUNITY): Payer: Self-pay

## 2022-11-21 MED ORDER — ZEPBOUND 2.5 MG/0.5ML ~~LOC~~ SOAJ
2.5000 mg | SUBCUTANEOUS | 0 refills | Status: DC
  Filled 2022-11-21: qty 2, 28d supply, fill #0

## 2022-11-21 NOTE — Telephone Encounter (Signed)
Spoke with patient. Will send Rx for Zepbound 2.5mg  to [facility]. He has been off for a little while. Will need to restart.

## 2022-11-26 NOTE — H&P (View-Only) (Signed)
Electrophysiology Office Note:    Date:  11/27/2022   [name], DOB January 27, 1966, MRN [id number]  PCP:  Kristian Covey, MD   [facility] Providers Cardiologist:  Armanda Magic, MD Sleep Medicine:  Armanda Magic, MD     Referring MD: Eustace Pen, PA-C   History of Present Illness:    Harold Park is a 57 y.o. male with a medical history significant for recurrent admissions for atrial flutter and fibrillation, congestive heart failure with reduced ejection fraction who presents for EP follow-up.     According to previous notes, he was first diagnosed with paroxysmal atrial flutter in November 2011.  His ejection fraction has been depressed with intermittent partial recovery for over a decade, was 30 to 35% in November 2011.  He was diagnosed with atrial fibrillation in April 2017 and converted to sinus rhythm prior to planned cardioversion.  He first came to [facility] health in May 2017.  At that time he was started on Entresto.  Sleep study showed severe sleep apnea.  He was seen by Dr. Graciela Husbands for ICD consideration. Holter monitor showed 6.3% PVCs, and his MRI did not show any LGE. ICD was deferred due to a possible reversible cause, and Ranexa was tried to suppress his PVCs.   He tried amiodarone but has not been able to tolerate it due to severe nausea.    He underwent DC cardioversion in April 2019 for recurrent AF and was seen in AF clinic days later but felt not to be a good candidate for ablation at the time due to his obesity (~330 lbs at the time) and LA dimension of 51 cm.  Continue to have recurrences of atrial fibrillation flutter and was again seen in A-fib clinic on August 2 and considered for Tikosyn load.  However, he has had recurrent admissions for AF and severe acute CHF and cardiogenic shock for which he received IV amiodarone loads. On at least one occasion, he presented with a very wide QRS rhythm, likely due to antiarrhythmic drug effects.      Today, he reports that he is doing very well.  He remains in normal rhythm with controlled rates on amiodarone.  Fortunately, he has been able to tolerate amiodarone and has not had any nausea or vomiting.  EKGs/Labs/Other Studies Reviewed Today:    Echocardiogram:  Echocardiogram October 28, 2022 LVEF 25 to 30%.  Severe LV dilation.  Left atrium severely dilated, 5.2 cm; right atrium moderately dilated.   Monitors:   Stress testing:   Advanced imaging:  CTA coronary December 2021 0 Agastson units  Cardiac catherization   EKG:   EKG Interpretation Date/Time:  Thursday November 27 2022 10:29:53 EDT Ventricular Rate:  89 PR Interval:  242 QRS Duration:  134 QT Interval:  408 QTC Calculation: 496 R Axis:   253  Text Interpretation: Sinus rhythm with 1st degree A-V block with Fusion complexes Non-specific intra-ventricular conduction block Lateral infarct , age undetermined When compared with ECG of 19-Nov-2022 12:20, Fusion complexes are now Present Confirmed by York Pellant [id number]) on 11/27/2022 10:50:15 AM     Physical Exam:    VS:  BP 96/66   Pulse 89   Ht 6\' 5"  (1.956 m)   Wt 291 lb (132 kg)   SpO2 97%   BMI 34.51 kg/m     Wt Readings from Last 3 Encounters:  11/27/22 291 lb (132 kg)  11/19/22 293 lb 3.2 oz (133 kg)  11/14/22 282 lb 1.6  oz (128 kg)     GEN: Well nourished, well developed in no acute distress CARDIAC: RRR, no murmurs, rubs, gallops RESPIRATORY:  Normal work of breathing MUSCULOSKELETAL: no edema    ASSESSMENT & PLAN:     Persistent atrial fibrillation Has tolerated PO amiodarone poorly in the past, tolerating well now Has difficult to control RVR, resulting in multiple CHF admissions Encouraged him to monitor his heart rate regularly and report rapid rates or symptoms of heart failure  We discussed the indication, rationale, logistics, anticipated benefits, and potential risks of the ablation procedure including but not  limited to -- bleed at the groin access site, chest pain, damage to nearby organs such as the diaphragm, lungs, or esophagus, need for a drainage tube, or prolonged hospitalization. I explained that the risk for stroke, heart attack, need for open chest surgery, or even death is very low but not zero. he  expressed understanding and wishes to proceed.   Atrial flutter -- appearing both typical and atypical Will plan for CTI line, possible posterior wall ablation  CHFrEF Appears euvolemic and compensated today Continue spironolactone 25 torsemide 20, empagliflozin 10, digoxin 0.125  Secondary hypercoagulable state Continue Eliquis 5 g twice daily  Obesity Currently at 291 pounds.  He has lost a significant amount of weight We discussed the importance of weight loss for maintenance of sinus rhythm    Signed, Maurice Small, MD  11/27/2022 11:08 AM    [facility]

## 2022-11-26 NOTE — Progress Notes (Unsigned)
Electrophysiology Office Note:    Date:  11/27/2022   ID:  Duy Strawther, DOB January 27, 1966, MRN [id number]  PCP:  Kristian Covey, MD   [facility] Providers Cardiologist:  Armanda Magic, MD Sleep Medicine:  Armanda Magic, MD     Referring MD: Eustace Pen, PA-C   History of Present Illness:    Christoffer Novakovic is a 57 y.o. male with a medical history significant for recurrent admissions for atrial flutter and fibrillation, congestive heart failure with reduced ejection fraction who presents for EP follow-up.     According to previous notes, he was first diagnosed with paroxysmal atrial flutter in November 2011.  His ejection fraction has been depressed with intermittent partial recovery for over a decade, was 30 to 35% in November 2011.  He was diagnosed with atrial fibrillation in April 2017 and converted to sinus rhythm prior to planned cardioversion.  He first came to [facility] health in May 2017.  At that time he was started on Entresto.  Sleep study showed severe sleep apnea.  He was seen by Dr. Graciela Husbands for ICD consideration. Holter monitor showed 6.3% PVCs, and his MRI did not show any LGE. ICD was deferred due to a possible reversible cause, and Ranexa was tried to suppress his PVCs.   He tried amiodarone but has not been able to tolerate it due to severe nausea.    He underwent DC cardioversion in April 2019 for recurrent AF and was seen in AF clinic days later but felt not to be a good candidate for ablation at the time due to his obesity (~330 lbs at the time) and LA dimension of 51 cm.  Continue to have recurrences of atrial fibrillation flutter and was again seen in A-fib clinic on August 2 and considered for Tikosyn load.  However, he has had recurrent admissions for AF and severe acute CHF and cardiogenic shock for which he received IV amiodarone loads. On at least one occasion, he presented with a very wide QRS rhythm, likely due to antiarrhythmic drug effects.      Today, he reports that he is doing very well.  He remains in normal rhythm with controlled rates on amiodarone.  Fortunately, he has been able to tolerate amiodarone and has not had any nausea or vomiting.  EKGs/Labs/Other Studies Reviewed Today:    Echocardiogram:  Echocardiogram October 28, 2022 LVEF 25 to 30%.  Severe LV dilation.  Left atrium severely dilated, 5.2 cm; right atrium moderately dilated.   Monitors:   Stress testing:   Advanced imaging:  CTA coronary December 2021 0 Agastson units  Cardiac catherization   EKG:   EKG Interpretation Date/Time:  Thursday November 27 2022 10:29:53 EDT Ventricular Rate:  89 PR Interval:  242 QRS Duration:  134 QT Interval:  408 QTC Calculation: 496 R Axis:   253  Text Interpretation: Sinus rhythm with 1st degree A-V block with Fusion complexes Non-specific intra-ventricular conduction block Lateral infarct , age undetermined When compared with ECG of 19-Nov-2022 12:20, Fusion complexes are now Present Confirmed by York Pellant [id number]) on 11/27/2022 10:50:15 AM     Physical Exam:    VS:  BP 96/66   Pulse 89   Ht 6\' 5"  (1.956 m)   Wt 291 lb (132 kg)   SpO2 97%   BMI 34.51 kg/m     Wt Readings from Last 3 Encounters:  11/27/22 291 lb (132 kg)  11/19/22 293 lb 3.2 oz (133 kg)  11/14/22 282 lb 1.6  oz (128 kg)     GEN: Well nourished, well developed in no acute distress CARDIAC: RRR, no murmurs, rubs, gallops RESPIRATORY:  Normal work of breathing MUSCULOSKELETAL: no edema    ASSESSMENT & PLAN:     Persistent atrial fibrillation Has tolerated PO amiodarone poorly in the past, tolerating well now Has difficult to control RVR, resulting in multiple CHF admissions Encouraged him to monitor his heart rate regularly and report rapid rates or symptoms of heart failure  We discussed the indication, rationale, logistics, anticipated benefits, and potential risks of the ablation procedure including but not  limited to -- bleed at the groin access site, chest pain, damage to nearby organs such as the diaphragm, lungs, or esophagus, need for a drainage tube, or prolonged hospitalization. I explained that the risk for stroke, heart attack, need for open chest surgery, or even death is very low but not zero. he  expressed understanding and wishes to proceed.   Atrial flutter -- appearing both typical and atypical Will plan for CTI line, possible posterior wall ablation  CHFrEF Appears euvolemic and compensated today Continue spironolactone 25 torsemide 20, empagliflozin 10, digoxin 0.125  Secondary hypercoagulable state Continue Eliquis 5 g twice daily  Obesity Currently at 291 pounds.  He has lost a significant amount of weight We discussed the importance of weight loss for maintenance of sinus rhythm    Signed, Maurice Small, MD  11/27/2022 11:08 AM    [facility]

## 2022-11-27 ENCOUNTER — Ambulatory Visit: Payer: BC Managed Care – PPO | Attending: Cardiovascular Disease | Admitting: Cardiovascular Disease

## 2022-11-27 ENCOUNTER — Encounter: Payer: Self-pay | Admitting: Cardiovascular Disease

## 2022-11-27 VITALS — BP 96/66 | HR 89 | Ht 77.0 in | Wt 291.0 lb

## 2022-11-27 DIAGNOSIS — I4892 Unspecified atrial flutter: Secondary | ICD-10-CM

## 2022-11-27 NOTE — Patient Instructions (Signed)
Medication Instructions:  Your physician recommends that you continue on your current medications as directed. Please refer to the Current Medication list given to you today. *If you need a refill on your cardiac medications before your next appointment, please call your pharmacy*   Lab Work: BMET and CBC today If you have labs (blood work) drawn today and your tests are completely normal, you will receive your results only by: MyChart Message (if you have MyChart) OR A paper copy in the mail If you have any lab test that is abnormal or we need to change your treatment, we will call you to review the results.   Testing/Procedures: Atrial Fibrillation Ablation Your physician has recommended that you have an ablation. Catheter ablation is a medical procedure used to treat some cardiac arrhythmias (irregular heartbeats). During catheter ablation, a long, thin, flexible tube is put into a blood vessel in your groin (upper thigh), or neck. This tube is called an ablation catheter. It is then guided to your heart through the blood vessel. Radio frequency waves destroy small areas of heart tissue where abnormal heartbeats may cause an arrhythmia to start. Please see the instruction sheet given to you today.  You are scheduled for Atrial Fibrillation Ablation on Wednesday, October 9 with Dr. Halford Chessman.Please arrive at the Main Entrance A at [facility]: [address] [place], [place] [address] at 9:30 AM      Follow-Up: At [facility], you and your health needs are our priority.  As part of our continuing mission to provide you with exceptional heart care, we have created designated Provider Care Teams.  These Care Teams include your primary Cardiologist (physician) and Advanced Practice Providers (APPs -  Physician Assistants and Nurse Practitioners) who all work together to provide you with the care you need, when you need it.  We recommend signing up for the patient portal  called "MyChart".  Sign up information is provided on this After Visit Summary.  MyChart is used to connect with patients for Virtual Visits (Telemedicine).  Patients are able to view lab/test results, encounter notes, upcoming appointments, etc.  Non-urgent messages can be sent to your provider as well.   To learn more about what you can do with MyChart, go to [email or web address].    Your next appointment:   We will schedule follow up for you after your ablation  Provider:   York Pellant, MD

## 2022-11-28 ENCOUNTER — Telehealth: Payer: Self-pay

## 2022-11-28 DIAGNOSIS — E875 Hyperkalemia: Secondary | ICD-10-CM

## 2022-11-28 LAB — BASIC METABOLIC PANEL
BUN/Creatinine Ratio: 19 (ref 9–20)
BUN: 28 mg/dL — ABNORMAL HIGH (ref 6–24)
CO2: 23 mmol/L (ref 20–29)
Calcium: 9.6 mg/dL (ref 8.7–10.2)
Chloride: 100 mmol/L (ref 96–106)
Creatinine, Ser: 1.49 mg/dL — ABNORMAL HIGH (ref 0.76–1.27)
Glucose: 89 mg/dL (ref 70–99)
Potassium: 5.3 mmol/L — ABNORMAL HIGH (ref 3.5–5.2)
Sodium: 142 mmol/L (ref 134–144)
eGFR: 54 mL/min/{1.73_m2} — ABNORMAL LOW (ref >59)

## 2022-11-28 LAB — CBC
Hematocrit: 60.7 % — ABNORMAL HIGH (ref 37.5–51.0)
Hemoglobin: 19.7 g/dL — ABNORMAL HIGH (ref 13.0–17.7)
MCH: 29 pg (ref 26.6–33.0)
MCHC: 32.5 g/dL (ref 31.5–35.7)
MCV: 89 fL (ref 79–97)
Platelets: 327 10*3/uL (ref 150–450)
RBC: 6.79 x10E6/uL — ABNORMAL HIGH (ref 4.14–5.80)
RDW: 17.5 % — ABNORMAL HIGH (ref 11.6–15.4)
WBC: 10.2 10*3/uL (ref 3.4–10.8)

## 2022-11-28 NOTE — Addendum Note (Signed)
Addended by: Alois Cliche on: 11/28/2022 12:47 PM   Modules accepted: Orders

## 2022-11-28 NOTE — Telephone Encounter (Signed)
Reviewed lab work with DOD, Dr. Lynnette Caffey, due to Potassium 5.3. He advised patient to hold his spironolactone and potassium for a few days, and get lab work repeated on Monday. Patient verbalized understanding.

## 2022-11-29 DIAGNOSIS — G4733 Obstructive sleep apnea (adult) (pediatric): Secondary | ICD-10-CM | POA: Diagnosis not present

## 2022-11-30 ENCOUNTER — Other Ambulatory Visit (HOSPITAL_COMMUNITY): Payer: Self-pay | Admitting: Internal Medicine

## 2022-12-01 ENCOUNTER — Ambulatory Visit: Payer: BC Managed Care – PPO | Attending: Cardiology

## 2022-12-01 DIAGNOSIS — E875 Hyperkalemia: Secondary | ICD-10-CM | POA: Diagnosis not present

## 2022-12-02 ENCOUNTER — Telehealth (HOSPITAL_COMMUNITY): Payer: Self-pay

## 2022-12-02 LAB — BASIC METABOLIC PANEL
BUN/Creatinine Ratio: 20 (ref 9–20)
BUN: 25 mg/dL — ABNORMAL HIGH (ref 6–24)
CO2: 20 mmol/L (ref 20–29)
Calcium: 9.6 mg/dL (ref 8.7–10.2)
Chloride: 98 mmol/L (ref 96–106)
Creatinine, Ser: 1.25 mg/dL (ref 0.76–1.27)
Glucose: 126 mg/dL — ABNORMAL HIGH (ref 70–99)
Potassium: 4.2 mmol/L (ref 3.5–5.2)
Sodium: 137 mmol/L (ref 134–144)
eGFR: 67 mL/min/{1.73_m2} (ref >59)

## 2022-12-02 NOTE — Telephone Encounter (Signed)
Called patient to see if he was interested in participating in the Cardiac Rehab Program. Patient stated yes. Patient will come in for orientation on 12/04/22 @ 1:15PM and will attend the 12:30PM exercise class. Went over insurance, patient verbalized understanding.   Pensions consultant.

## 2022-12-02 NOTE — Telephone Encounter (Signed)
Pt insurance is active and benefits verified through [employer]. Co-pay $0.00, DED $4,000.00/$4,000.00 met, out of pocket $4,000.00/$4,000.00 met, co-insurance 20%. No pre-authorization required. [place], 12/02/22 @ 3:25PM, VHQ#[id number]   How many CR sessions are covered? (36 visits for TCR, 72 visits for ICR)72 Is this a lifetime maximum or an annual maximum? Lifetime Has the member used any of these services to date? No Is there a time limit (weeks/months) on start of program and/or program completion? No     Will contact patient to see if he is interested in the Cardiac Rehab Program.

## 2022-12-03 ENCOUNTER — Telehealth (HOSPITAL_COMMUNITY): Payer: Self-pay

## 2022-12-03 ENCOUNTER — Other Ambulatory Visit: Payer: Self-pay

## 2022-12-03 MED ORDER — POTASSIUM CHLORIDE CRYS ER 20 MEQ PO TBCR: 40.0000 meq | EXTENDED_RELEASE_TABLET | Freq: Two times a day (BID) | ORAL | 3 refills | Status: DC

## 2022-12-03 NOTE — Telephone Encounter (Signed)
Called pt to confirm appt for 12/04/22. No answer, LM on VM.   Jonna Coup, MS, ACSM-CEP 12/03/2022 4:55 PM

## 2022-12-04 ENCOUNTER — Encounter (HOSPITAL_COMMUNITY)
Admission: RE | Admit: 2022-12-04 | Discharge: 2022-12-04 | Disposition: A | Discharge status: Home / Self Care | Payer: BC Managed Care – PPO | Source: Ambulatory Visit | Attending: Internal Medicine | Admitting: Internal Medicine

## 2022-12-04 VITALS — BP 100/68 | HR 89 | Ht 77.0 in | Wt 293.1 lb

## 2022-12-04 DIAGNOSIS — I110 Hypertensive heart disease with heart failure: Secondary | ICD-10-CM | POA: Insufficient documentation

## 2022-12-04 DIAGNOSIS — I5022 Chronic systolic (congestive) heart failure: Secondary | ICD-10-CM | POA: Insufficient documentation

## 2022-12-04 DIAGNOSIS — Z5189 Encounter for other specified aftercare: Secondary | ICD-10-CM | POA: Insufficient documentation

## 2022-12-04 NOTE — Progress Notes (Signed)
Cardiac Individual Treatment Plan  Patient Details  Name: Harold Park MRN: [id number] Date of Birth: 1965-05-18 Referring Provider:   Flowsheet Row INTENSIVE CARDIAC REHAB ORIENT from 12/04/2022 in [facility]  Referring Provider Arvilla Meres, MD       Initial Encounter Date:  Flowsheet Row INTENSIVE CARDIAC REHAB ORIENT from 12/04/2022 in [facility]  Date 12/04/22       Visit Diagnosis: Heart failure, chronic systolic (HCC)  Patient's Home Medications on Admission:  Current Outpatient Medications:    potassium chloride SA (KLOR-CON M) 20 MEQ tablet, Take 2 tablets (40 mEq total) by mouth 2 (two) times daily., Disp: 360 tablet, Rfl: 3   albuterol (VENTOLIN HFA) 108 (90 Base) MCG/ACT inhaler, TAKE 2 PUFFS BY MOUTH EVERY 6 HOURS AS NEEDED FOR WHEEZE OR SHORTNESS OF BREATH, Disp: 8.5 each, Rfl: 2   amiodarone (PACERONE) 200 MG tablet, Take 2 tablets (400 mg total) by mouth 2 (two) times daily for 7 days (until 11/13/22), THEN 1 tablet (200 mg total) 2 (two) times daily thereafter, Disp: 70 tablet, Rfl: 6   digoxin (LANOXIN) 0.125 MG tablet, Take 1 tablet (0.125 mg total) by mouth daily., Disp: 30 tablet, Rfl: 5   DULoxetine (CYMBALTA) 20 MG capsule, TAKE 1 CAPSULE BY MOUTH EVERY DAY, Disp: 90 capsule, Rfl: 3   ELIQUIS 5 MG TABS tablet, TAKE 1 TABLET BY MOUTH TWICE A DAY, Disp: 60 tablet, Rfl: 6   empagliflozin (JARDIANCE) 10 MG TABS tablet, Take 1 tablet (10 mg total) by mouth daily., Disp: 30 tablet, Rfl: 5   Magnesium 250 MG TABS, Take 1 tablet by mouth every evening., Disp: , Rfl:    ondansetron (ZOFRAN) 4 MG tablet, Take 1 tablet (4 mg total) by mouth every 8 (eight) hours as needed for nausea or vomiting., Disp: 60 tablet, Rfl: 1   spironolactone (ALDACTONE) 25 MG tablet, Take 1 tablet (25 mg total) by mouth daily., Disp: 30 tablet, Rfl: 5   tirzepatide  (ZEPBOUND) 2.5 MG/0.5ML Pen, Inject 2.5 mg into the skin once a week., Disp: 2 mL, Rfl: 0   torsemide (DEMADEX) 20 MG tablet, Take 3 tablets (60 mg total) by mouth 2 (two) times daily., Disp: 270 tablet, Rfl: 5  Past Medical History: Past Medical History:  Diagnosis Date   Chronic bronchitis    HTN (hypertension)    NICM (nonischemic cardiomyopathy) (HCC)    Obesity (BMI 30-39.9) 12/04/2015   OSA (obstructive sleep apnea) 08/19/2015   Severe with AHI 64/hr now on CPAP at 13cm H2O   PAF (paroxysmal atrial fibrillation) (HCC)    Systolic CHF, chronic (HCC)     Tobacco Use: Social History   Tobacco Use  Smoking Status Never  Smokeless Tobacco Never    Labs: Review Flowsheet  More data exists      Latest Ref Rng & Units 11/05/2022 11/06/2022 11/07/2022 11/08/2022 11/13/2022  Labs for ITP Cardiac and Pulmonary Rehab  TCO2 22 - 32 mmol/L - - - - 23   O2 Saturation % 71  80.9  68.9  84.7  -    Details            Capillary Blood Glucose: Lab Results  Component Value Date   GLUCAP 114 (H) 11/08/2022   GLUCAP 69 (L) 11/08/2022   GLUCAP 73 11/08/2022   GLUCAP 109 (H) 11/07/2022   GLUCAP 155 (H) 11/07/2022     Exercise  Target Goals: Exercise Program Goal: Individual exercise prescription set using results from initial 6 min walk test and THRR while considering  patient's activity barriers and safety.   Exercise Prescription Goal: Initial exercise prescription builds to 30-45 minutes a day of aerobic activity, 2-3 days per week.  Home exercise guidelines will be given to patient during program as part of exercise prescription that the participant will acknowledge.  Activity Barriers & Risk Stratification:  Activity Barriers & Cardiac Risk Stratification - 12/04/22 1415       Activity Barriers & Cardiac Risk Stratification   Activity Barriers Decreased Ventricular Function    Cardiac Risk Stratification High   <5 METs on            6 Minute Walk:  6 Minute  Walk     Row Name 12/04/22 1511         6 Minute Walk   Phase Initial     Distance 1490 feet     Walk Time 6 minutes     # of Rest Breaks 0     MPH 2.82     METS 3.56     RPE 11     Perceived Dyspnea  0     VO2 Peak 11.75     Symptoms No     Resting HR 87 bpm     Resting BP 100/68     Resting Oxygen Saturation  98 %     Exercise Oxygen Saturation  during 6 min walk 95 %     Max Ex. HR 109 bpm     Max Ex. BP 122/78     2 Minute Post BP 114/86              Oxygen Initial Assessment:   Oxygen Re-Evaluation:   Oxygen Discharge (Final Oxygen Re-Evaluation):   Initial Exercise Prescription:  Initial Exercise Prescription - 12/04/22 1500       Date of Initial Exercise RX and Referring Provider   Date 12/04/22    Referring Provider Arvilla Meres, MD    Expected Discharge Date 02/25/23      Treadmill   MPH 2    Grade 0    Minutes 15    METs 3      Bike   Level 1.5    Watts 60    Minutes 15    METs 3      Prescription Details   Frequency (times per week) 3    Duration Progress to 30 minutes of continuous aerobic without signs/symptoms of physical distress      Intensity   THRR 40-80% of Max Heartrate 65-130    Ratings of Perceived Exertion 11-13    Perceived Dyspnea 0-4      Progression   Progression Continue progressive overload as per policy without signs/symptoms or physical distress.      Resistance Training   Training Prescription Yes    Weight 4    Reps 10-15             Perform Capillary Blood Glucose checks as needed.  Exercise Prescription Changes:   Exercise Comments:   Exercise Goals and Review:   Exercise Goals     Row Name 12/04/22 1346             Exercise Goals   Increase Physical Activity Yes       Intervention Provide advice, education, support and counseling about physical activity/exercise needs.;Develop an individualized exercise prescription for aerobic and resistive training  based on initial  evaluation findings, risk stratification, comorbidities and participant's personal goals.       Expected Outcomes Short Term: Attend rehab on a regular basis to increase amount of physical activity.;Long Term: Exercising regularly at least 3-5 days a week.;Long Term: Add in home exercise to make exercise part of routine and to increase amount of physical activity.       Increase Strength and Stamina Yes       Intervention Provide advice, education, support and counseling about physical activity/exercise needs.;Develop an individualized exercise prescription for aerobic and resistive training based on initial evaluation findings, risk stratification, comorbidities and participant's personal goals.       Expected Outcomes Short Term: Increase workloads from initial exercise prescription for resistance, speed, and METs.;Short Term: Perform resistance training exercises routinely during rehab and add in resistance training at home;Long Term: Improve cardiorespiratory fitness, muscular endurance and strength as measured by increased METs and functional capacity ( )       Able to understand and use rate of perceived exertion (RPE) scale Yes       Intervention Provide education and explanation on how to use RPE scale       Expected Outcomes Short Term: Able to use RPE daily in rehab to express subjective intensity level;Long Term:  Able to use RPE to guide intensity level when exercising independently       Knowledge and understanding of Target Heart Rate Range (THRR) Yes       Intervention Provide education and explanation of THRR including how the numbers were predicted and where they are located for reference       Expected Outcomes Short Term: Able to state/look up THRR;Short Term: Able to use daily as guideline for intensity in rehab;Long Term: Able to use THRR to govern intensity when exercising independently       Understanding of Exercise Prescription Yes       Intervention Provide education,  explanation, and written materials on patient's individual exercise prescription       Expected Outcomes Short Term: Able to explain program exercise prescription;Long Term: Able to explain home exercise prescription to exercise independently                Exercise Goals Re-Evaluation :   Discharge Exercise Prescription (Final Exercise Prescription Changes):   Nutrition:  Target Goals: Understanding of nutrition guidelines, daily intake of sodium 1500mg , cholesterol 200mg , calories 30% from fat and 7% or less from saturated fats, daily to have 5 or more servings of fruits and vegetables.  Biometrics:  Pre Biometrics - 12/04/22 1319       Pre Biometrics   Waist Circumference 51.5 inches    Hip Circumference 47 inches    Waist to Hip Ratio 1.1 %    Triceps Skinfold 28 mm    % Body Fat 37.7 %    Grip Strength 54 kg    Flexibility 11.5 in    Single Leg Stand 14.68 seconds              Nutrition Therapy Plan and Nutrition Goals:   Nutrition Assessments:  MEDIFICTS Score Key: >=70 Need to make dietary changes  40-70 Heart Healthy Diet <= 40 Therapeutic Level Cholesterol Diet    Picture Your Plate Scores: <56 Unhealthy dietary pattern with much room for improvement. 41-50 Dietary pattern unlikely to meet recommendations for good health and room for improvement. 51-60 More healthful dietary pattern, with some room for improvement.  >60 Healthy dietary pattern, although  there may be some specific behaviors that could be improved.    Nutrition Goals Re-Evaluation:   Nutrition Goals Re-Evaluation:   Nutrition Goals Discharge (Final Nutrition Goals Re-Evaluation):   Psychosocial: Target Goals: Acknowledge presence or absence of significant depression and/or stress, maximize coping skills, provide positive support system. Participant is able to verbalize types and ability to use techniques and skills needed for reducing stress and depression.  Initial Review  & Psychosocial Screening:  Initial Psych Review & Screening - 12/04/22 1409       Initial Review   Current issues with Current Depression;Current Psychotropic Meds;Current Stress Concerns    Source of Stress Concerns Unable to participate in former interests or hobbies;Occupation;Financial;Family;Chronic Illness    Comments Art shared that he has had some depression since his hospitalization and stress due to working with heart issues and the financial stress of that. No current feelings of anxiety. Art feels his cymbalta somewhat works for him, denies any need for additional resources at this time.      Family Dynamics   Good Support System? Yes   Has wife for support     Barriers   Psychosocial barriers to participate in program The patient should benefit from training in stress management and relaxation.      Screening Interventions   Interventions To provide support and resources with identified psychosocial needs;Provide feedback about the scores to participant;Encouraged to exercise    Expected Outcomes Short Term goal: Utilizing psychosocial counselor, staff and physician to assist with identification of specific Stressors or current issues interfering with healing process. Setting desired goal for each stressor or current issue identified.;Long Term Goal: Stressors or current issues are controlled or eliminated.;Short Term goal: Identification and review with participant of any Quality of Life or Depression concerns found by scoring the questionnaire.;Long Term goal: The participant improves quality of Life and PHQ9 Scores as seen by post scores and/or verbalization of changes             Quality of Life Scores:  Quality of Life - 12/04/22 1423       Quality of Life   Select Quality of Life      Quality of Life Scores   Health/Function Pre 15.57 %    Socioeconomic Pre 26.43 %    Psych/Spiritual Pre 18.79 %    Family Pre 28.3 %    GLOBAL Pre 20.34 %             Scores of 19 and below usually indicate a poorer quality of life in these areas.  A difference of  2-3 points is a clinically meaningful difference.  A difference of 2-3 points in the total score of the Quality of Life Index has been associated with significant improvement in overall quality of life, self-image, physical symptoms, and general health in studies assessing change in quality of life.  PHQ-9: Review Flowsheet       12/04/2022 11/29/2021  Depression screen PHQ 2/9  Decreased Interest 1 1  Down, Depressed, Hopeless 1 0  PHQ - 2 Score 2 1  Altered sleeping 0 -  Tired, decreased energy 2 -  Change in appetite 0 -  Feeling bad or failure about yourself  1 -  Trouble concentrating 0 -  Moving slowly or fidgety/restless 0 -  Suicidal thoughts 0 -  PHQ-9 Score 5 -  Difficult doing work/chores Somewhat difficult -    Details           Interpretation of Total Score  Total Score Depression Severity:  1-4 = Minimal depression, 5-9 = Mild depression, 10-14 = Moderate depression, 15-19 = Moderately severe depression, 20-27 = Severe depression   Psychosocial Evaluation and Intervention:   Psychosocial Re-Evaluation:   Psychosocial Discharge (Final Psychosocial Re-Evaluation):   Vocational Rehabilitation: Provide vocational rehab assistance to qualifying candidates.   Vocational Rehab Evaluation & Intervention:  Vocational Rehab - 12/04/22 1412       Initial Vocational Rehab Evaluation & Intervention   Assessment shows need for Vocational Rehabilitation No   Art is working            Education: Education Goals: Education classes will be provided on a weekly basis, covering required topics. Participant will state understanding/return demonstration of topics presented.     Core Videos: Exercise    Move It!  Clinical staff conducted group or individual video education with verbal and written material and guidebook.  Patient learns the recommended Pritikin  exercise program. Exercise with the goal of living a long, healthy life. Some of the health benefits of exercise include controlled diabetes, healthier blood pressure levels, improved cholesterol levels, improved heart and lung capacity, improved sleep, and better body composition. Everyone should speak with their doctor before starting or changing an exercise routine.  Biomechanical Limitations Clinical staff conducted group or individual video education with verbal and written material and guidebook.  Patient learns how biomechanical limitations can impact exercise and how we can mitigate and possibly overcome limitations to have an impactful and balanced exercise routine.  Body Composition Clinical staff conducted group or individual video education with verbal and written material and guidebook.  Patient learns that body composition (ratio of muscle mass to fat mass) is a key component to assessing overall fitness, rather than body weight alone. Increased fat mass, especially visceral belly fat, can put [place] at increased risk for metabolic syndrome, type 2 diabetes, heart disease, and even death. It is recommended to combine diet and exercise (cardiovascular and resistance training) to improve your body composition. Seek guidance from your physician and exercise physiologist before implementing an exercise routine.  Exercise Action Plan Clinical staff conducted group or individual video education with verbal and written material and guidebook.  Patient learns the recommended strategies to achieve and enjoy long-term exercise adherence, including variety, self-motivation, self-efficacy, and positive decision making. Benefits of exercise include fitness, good health, weight management, more energy, better sleep, less stress, and overall well-being.  Medical   Heart Disease Risk Reduction Clinical staff conducted group or individual video education with verbal and written material and guidebook.   Patient learns our heart is our most vital organ as it circulates oxygen, nutrients, white blood cells, and hormones throughout the entire body, and carries waste away. Data supports a plant-based eating plan like the Pritikin Program for its effectiveness in slowing progression of and reversing heart disease. The video provides a number of recommendations to address heart disease.   Metabolic Syndrome and Belly Fat  Clinical staff conducted group or individual video education with verbal and written material and guidebook.  Patient learns what metabolic syndrome is, how it leads to heart disease, and how one can reverse it and keep it from coming back. You have metabolic syndrome if you have 3 of the following 5 criteria: abdominal obesity, high blood pressure, high triglycerides, low HDL cholesterol, and high blood sugar.  Hypertension and Heart Disease Clinical staff conducted group or individual video education with verbal and written material and guidebook.  Patient learns that high blood  pressure, or hypertension, is very common in the [place]. Hypertension is largely due to excessive salt intake, but other important risk factors include being overweight, physical inactivity, drinking too much alcohol, smoking, and not eating enough potassium from fruits and vegetables. High blood pressure is a leading risk factor for heart attack, stroke, congestive heart failure, dementia, kidney failure, and premature death. Long-term effects of excessive salt intake include stiffening of the arteries and thickening of heart muscle and organ damage. Recommendations include ways to reduce hypertension and the risk of heart disease.  Diseases of Our Time - Focusing on Diabetes Clinical staff conducted group or individual video education with verbal and written material and guidebook.  Patient learns why the best way to stop diseases of our time is prevention, through food and other lifestyle changes.  Medicine (such as prescription pills and surgeries) is often only a Band-Aid on the problem, not a long-term solution. Most common diseases of our time include obesity, type 2 diabetes, hypertension, heart disease, and cancer. The Pritikin Program is recommended and has been proven to help reduce, reverse, and/or prevent the damaging effects of metabolic syndrome.  Nutrition   Overview of the Pritikin Eating Plan  Clinical staff conducted group or individual video education with verbal and written material and guidebook.  Patient learns about the Pritikin Eating Plan for disease risk reduction. The Pritikin Eating Plan emphasizes a wide variety of unrefined, minimally-processed carbohydrates, like fruits, vegetables, whole grains, and legumes. Go, Caution, and Stop food choices are explained. Plant-based and lean animal proteins are emphasized. Rationale provided for low sodium intake for blood pressure control, low added sugars for blood sugar stabilization, and low added fats and oils for coronary artery disease risk reduction and weight management.  Calorie Density  Clinical staff conducted group or individual video education with verbal and written material and guidebook.  Patient learns about calorie density and how it impacts the Pritikin Eating Plan. Knowing the characteristics of the food you choose will help you decide whether those foods will lead to weight gain or weight loss, and whether you want to consume more or less of them. Weight loss is usually a side effect of the Pritikin Eating Plan because of its focus on low calorie-dense foods.  Label Reading  Clinical staff conducted group or individual video education with verbal and written material and guidebook.  Patient learns about the Pritikin recommended label reading guidelines and corresponding recommendations regarding calorie density, added sugars, sodium content, and whole grains.  Dining Out - Part 1  Clinical staff conducted  group or individual video education with verbal and written material and guidebook.  Patient learns that restaurant meals can be sabotaging because they can be so high in calories, fat, sodium, and/or sugar. Patient learns recommended strategies on how to positively address this and avoid unhealthy pitfalls.  Facts on Fats  Clinical staff conducted group or individual video education with verbal and written material and guidebook.  Patient learns that lifestyle modifications can be just as effective, if not more so, as many medications for lowering your risk of heart disease. A Pritikin lifestyle can help to reduce your risk of inflammation and atherosclerosis (cholesterol build-up, or plaque, in the artery walls). Lifestyle interventions such as dietary choices and physical activity address the cause of atherosclerosis. A review of the types of fats and their impact on blood cholesterol levels, along with dietary recommendations to reduce fat intake is also included.  Nutrition Action Plan  Clinical staff conducted  group or individual video education with verbal and written material and guidebook.  Patient learns how to incorporate Pritikin recommendations into their lifestyle. Recommendations include planning and keeping personal health goals in mind as an important part of their success.  Healthy Mind-Set    Healthy Minds, Bodies, Hearts  Clinical staff conducted group or individual video education with verbal and written material and guidebook.  Patient learns how to identify when they are stressed. Video will discuss the impact of that stress, as well as the many benefits of stress management. Patient will also be introduced to stress management techniques. The way we think, act, and feel has an impact on our hearts.  How Our Thoughts Can Heal Our Hearts  Clinical staff conducted group or individual video education with verbal and written material and guidebook.  Patient learns that negative  thoughts can cause depression and anxiety. This can result in negative lifestyle behavior and serious health problems. Cognitive behavioral therapy is an effective method to help control our thoughts in order to change and improve our emotional outlook.  Additional Videos:  Exercise    Improving Performance  Clinical staff conducted group or individual video education with verbal and written material and guidebook.  Patient learns to use a non-linear approach by alternating intensity levels and lengths of time spent exercising to help burn more calories and lose more body fat. Cardiovascular exercise helps improve heart health, metabolism, hormonal balance, blood sugar control, and recovery from fatigue. Resistance training improves strength, endurance, balance, coordination, reaction time, metabolism, and muscle mass. Flexibility exercise improves circulation, posture, and balance. Seek guidance from your physician and exercise physiologist before implementing an exercise routine and learn your capabilities and proper form for all exercise.  Introduction to Yoga  Clinical staff conducted group or individual video education with verbal and written material and guidebook.  Patient learns about yoga, a discipline of the coming together of mind, breath, and body. The benefits of yoga include improved flexibility, improved range of motion, better posture and core strength, increased lung function, weight loss, and positive self-image. Yoga's heart health benefits include lowered blood pressure, healthier heart rate, decreased cholesterol and triglyceride levels, improved immune function, and reduced stress. Seek guidance from your physician and exercise physiologist before implementing an exercise routine and learn your capabilities and proper form for all exercise.  Medical   Aging: Enhancing Your Quality of Life  Clinical staff conducted group or individual video education with verbal and written  material and guidebook.  Patient learns key strategies and recommendations to stay in good physical health and enhance quality of life, such as prevention strategies, having an advocate, securing a Health Care Proxy and Power of Attorney, and keeping a list of medications and system for tracking them. It also discusses how to avoid risk for bone loss.  Biology of Weight Control  Clinical staff conducted group or individual video education with verbal and written material and guidebook.  Patient learns that weight gain occurs because we consume more calories than we burn (eating more, moving less). Even if your body weight is normal, you may have higher ratios of fat compared to muscle mass. Too much body fat puts you at increased risk for cardiovascular disease, heart attack, stroke, type 2 diabetes, and obesity-related cancers. In addition to exercise, following the Pritikin Eating Plan can help reduce your risk.  Decoding Lab Results  Clinical staff conducted group or individual video education with verbal and written material and guidebook.  Patient learns that  lab test reflects one measurement whose values change over time and are influenced by many factors, including medication, stress, sleep, exercise, food, hydration, pre-existing medical conditions, and more. It is recommended to use the knowledge from this video to become more involved with your lab results and evaluate your numbers to speak with your doctor.   Diseases of Our Time - Overview  Clinical staff conducted group or individual video education with verbal and written material and guidebook.  Patient learns that according to the CDC, 50% to 70% of chronic diseases (such as obesity, type 2 diabetes, elevated lipids, hypertension, and heart disease) are avoidable through lifestyle improvements including healthier food choices, listening to satiety cues, and increased physical activity.  Sleep Disorders Clinical staff conducted group  or individual video education with verbal and written material and guidebook.  Patient learns how good quality and duration of sleep are important to overall health and well-being. Patient also learns about sleep disorders and how they impact health along with recommendations to address them, including discussing with a physician.  Nutrition  Dining Out - Part 2 Clinical staff conducted group or individual video education with verbal and written material and guidebook.  Patient learns how to plan ahead and communicate in order to maximize their dining experience in a healthy and nutritious manner. Included are recommended food choices based on the type of restaurant the patient is visiting.   Fueling a Banker conducted group or individual video education with verbal and written material and guidebook.  There is a strong connection between our food choices and our health. Diseases like obesity and type 2 diabetes are very prevalent and are in large-part due to lifestyle choices. The Pritikin Eating Plan provides plenty of food and hunger-curbing satisfaction. It is easy to follow, affordable, and helps reduce health risks.  Menu Workshop  Clinical staff conducted group or individual video education with verbal and written material and guidebook.  Patient learns that restaurant meals can sabotage health goals because they are often packed with calories, fat, sodium, and sugar. Recommendations include strategies to plan ahead and to communicate with the manager, chef, or server to help order a healthier meal.  Planning Your Eating Strategy  Clinical staff conducted group or individual video education with verbal and written material and guidebook.  Patient learns about the Pritikin Eating Plan and its benefit of reducing the risk of disease. The Pritikin Eating Plan does not focus on calories. Instead, it emphasizes high-quality, nutrient-rich foods. By knowing the  characteristics of the foods, we choose, we can determine their calorie density and make informed decisions.  Targeting Your Nutrition Priorities  Clinical staff conducted group or individual video education with verbal and written material and guidebook.  Patient learns that lifestyle habits have a tremendous impact on disease risk and progression. This video provides eating and physical activity recommendations based on your personal health goals, such as reducing LDL cholesterol, losing weight, preventing or controlling type 2 diabetes, and reducing high blood pressure.  Vitamins and Minerals  Clinical staff conducted group or individual video education with verbal and written material and guidebook.  Patient learns different ways to obtain key vitamins and minerals, including through a recommended healthy diet. It is important to discuss all supplements you take with your doctor.   Healthy Mind-Set    Smoking Cessation  Clinical staff conducted group or individual video education with verbal and written material and guidebook.  Patient learns that cigarette smoking and tobacco addiction  pose a serious health risk which affects millions of people. Stopping smoking will significantly reduce the risk of heart disease, lung disease, and many forms of cancer. Recommended strategies for quitting are covered, including working with your doctor to develop a successful plan.  Culinary   Becoming a Set designer conducted group or individual video education with verbal and written material and guidebook.  Patient learns that cooking at home can be healthy, cost-effective, quick, and puts them in control. Keys to cooking healthy recipes will include looking at your recipe, assessing your equipment needs, planning ahead, making it simple, choosing cost-effective seasonal ingredients, and limiting the use of added fats, salts, and sugars.  Cooking - Breakfast and Snacks  Clinical staff  conducted group or individual video education with verbal and written material and guidebook.  Patient learns how important breakfast is to satiety and nutrition through the entire day. Recommendations include key foods to eat during breakfast to help stabilize blood sugar levels and to prevent overeating at meals later in the day. Planning ahead is also a key component.  Cooking - Educational psychologist conducted group or individual video education with verbal and written material and guidebook.  Patient learns eating strategies to improve overall health, including an approach to cook more at home. Recommendations include thinking of animal protein as a side on your plate rather than center stage and focusing instead on lower calorie dense options like vegetables, fruits, whole grains, and plant-based proteins, such as beans. Making sauces in large quantities to freeze for later and leaving the skin on your vegetables are also recommended to maximize your experience.  Cooking - Healthy Salads and Dressing Clinical staff conducted group or individual video education with verbal and written material and guidebook.  Patient learns that vegetables, fruits, whole grains, and legumes are the foundations of the Pritikin Eating Plan. Recommendations include how to incorporate each of these in flavorful and healthy salads, and how to create homemade salad dressings. Proper handling of ingredients is also covered. Cooking - Soups and [employer] - Soups and Desserts Clinical staff conducted group or individual video education with verbal and written material and guidebook.  Patient learns that Pritikin soups and desserts make for easy, nutritious, and delicious snacks and meal components that are low in sodium, fat, sugar, and calorie density, while high in vitamins, minerals, and filling fiber. Recommendations include simple and healthy ideas for soups and desserts.   Overview     The  Pritikin Solution Program Overview Clinical staff conducted group or individual video education with verbal and written material and guidebook.  Patient learns that the results of the Pritikin Program have been documented in more than 100 articles published in peer-reviewed journals, and the benefits include reducing risk factors for (and, in some cases, even reversing) high cholesterol, high blood pressure, type 2 diabetes, obesity, and more! An overview of the three key pillars of the Pritikin Program will be covered: eating well, doing regular exercise, and having a healthy mind-set.  WORKSHOPS  Exercise: Exercise Basics: Building Your Action Plan Clinical staff led group instruction and group discussion with PowerPoint presentation and patient guidebook. To enhance the learning environment the use of posters, models and videos may be added. At the conclusion of this workshop, patients will comprehend the difference between physical activity and exercise, as well as the benefits of incorporating both, into their routine. Patients will understand the FITT (Frequency, Intensity, Time, and Type) principle and  how to use it to build an exercise action plan. In addition, safety concerns and other considerations for exercise and cardiac rehab will be addressed by the presenter. The purpose of this lesson is to promote a comprehensive and effective weekly exercise routine in order to improve patients' overall level of fitness.   Managing Heart Disease: Your Path to a Healthier Heart Clinical staff led group instruction and group discussion with PowerPoint presentation and patient guidebook. To enhance the learning environment the use of posters, models and videos may be added.At the conclusion of this workshop, patients will understand the anatomy and physiology of the heart. Additionally, they will understand how Pritikin's three pillars impact the risk factors, the progression, and the management  of heart disease.  The purpose of this lesson is to provide a high-level overview of the heart, heart disease, and how the Pritikin lifestyle positively impacts risk factors.  Exercise Biomechanics Clinical staff led group instruction and group discussion with PowerPoint presentation and patient guidebook. To enhance the learning environment the use of posters, models and videos may be added. Patients will learn how the structural parts of their bodies function and how these functions impact their daily activities, movement, and exercise. Patients will learn how to promote a neutral spine, learn how to manage pain, and identify ways to improve their physical movement in order to promote healthy living. The purpose of this lesson is to expose patients to common physical limitations that impact physical activity. Participants will learn practical ways to adapt and manage aches and pains, and to minimize their effect on regular exercise. Patients will learn how to maintain good posture while sitting, walking, and lifting.  Balance Training and Fall Prevention  Clinical staff led group instruction and group discussion with PowerPoint presentation and patient guidebook. To enhance the learning environment the use of posters, models and videos may be added. At the conclusion of this workshop, patients will understand the importance of their sensorimotor skills (vision, proprioception, and the vestibular system) in maintaining their ability to balance as they age. Patients will apply a variety of balancing exercises that are appropriate for their current level of function. Patients will understand the common causes for poor balance, possible solutions to these problems, and ways to modify their physical environment in order to minimize their fall risk. The purpose of this lesson is to teach patients about the importance of maintaining balance as they age and ways to minimize their risk of  falling.  WORKSHOPS   Nutrition:  Fueling a Ship broker led group instruction and group discussion with PowerPoint presentation and patient guidebook. To enhance the learning environment the use of posters, models and videos may be added. Patients will review the foundational principles of the Pritikin Eating Plan and understand what constitutes a serving size in each of the food groups. Patients will also learn Pritikin-friendly foods that are better choices when away from home and review make-ahead meal and snack options. Calorie density will be reviewed and applied to three nutrition priorities: weight maintenance, weight loss, and weight gain. The purpose of this lesson is to reinforce (in a group setting) the key concepts around what patients are recommended to eat and how to apply these guidelines when away from home by planning and selecting Pritikin-friendly options. Patients will understand how calorie density may be adjusted for different weight management goals.  Mindful Eating  Clinical staff led group instruction and group discussion with PowerPoint presentation and patient guidebook. To enhance the learning  environment the use of posters, models and videos may be added. Patients will briefly review the concepts of the Pritikin Eating Plan and the importance of low-calorie dense foods. The concept of mindful eating will be introduced as well as the importance of paying attention to internal hunger signals. Triggers for non-hunger eating and techniques for dealing with triggers will be explored. The purpose of this lesson is to provide patients with the opportunity to review the basic principles of the Pritikin Eating Plan, discuss the value of eating mindfully and how to measure internal cues of hunger and fullness using the Hunger Scale. Patients will also discuss reasons for non-hunger eating and learn strategies to use for controlling emotional eating.  Targeting Your  Nutrition Priorities Clinical staff led group instruction and group discussion with PowerPoint presentation and patient guidebook. To enhance the learning environment the use of posters, models and videos may be added. Patients will learn how to determine their genetic susceptibility to disease by reviewing their family history. Patients will gain insight into the importance of diet as part of an overall healthy lifestyle in mitigating the impact of genetics and other environmental insults. The purpose of this lesson is to provide patients with the opportunity to assess their personal nutrition priorities by looking at their family history, their own health history and current risk factors. Patients will also be able to discuss ways of prioritizing and modifying the Pritikin Eating Plan for their highest risk areas  Menu  Clinical staff led group instruction and group discussion with PowerPoint presentation and patient guidebook. To enhance the learning environment the use of posters, models and videos may be added. Using menus brought in from [employer], or printed from [employer], patients will apply the Pritikin dining out guidelines that were presented in the [employer] video. Patients will also be able to practice these guidelines in a variety of provided scenarios. The purpose of this lesson is to provide patients with the opportunity to practice hands-on learning of the Pritikin Dining Out guidelines with actual menus and practice scenarios.  Label Reading Clinical staff led group instruction and group discussion with PowerPoint presentation and patient guidebook. To enhance the learning environment the use of posters, models and videos may be added. Patients will review and discuss the Pritikin label reading guidelines presented in Pritikin's Label Reading Educational series video. Using fool labels brought in from local grocery stores and markets, patients will apply the  label reading guidelines and determine if the packaged food meet the Pritikin guidelines. The purpose of this lesson is to provide patients with the opportunity to review, discuss, and practice hands-on learning of the Pritikin Label Reading guidelines with actual packaged food labels. Cooking School  Pritikin's [employer] are designed to teach patients ways to prepare quick, simple, and affordable recipes at home. The importance of nutrition's role in chronic disease risk reduction is reflected in its emphasis in the overall Pritikin program. By learning how to prepare essential core Pritikin Eating Plan recipes, patients will increase control over what they eat; be able to customize the flavor of foods without the use of added salt, sugar, or fat; and improve the quality of the food they consume. By learning a set of core recipes which are easily assembled, quickly prepared, and affordable, patients are more likely to prepare more healthy foods at home. These workshops focus on convenient breakfasts, simple entres, side dishes, and desserts which can be prepared with minimal effort and are consistent  with nutrition recommendations for cardiovascular risk reduction. Cooking [employer] are taught by a Armed forces logistics/support/administrative officer (RD) who has been trained by the [employer]. The chef or RD has a clear understanding of the importance of minimizing - if not completely eliminating - added fat, sugar, and sodium in recipes. Throughout the series of Cooking School Workshop sessions, patients will learn about healthy ingredients and efficient methods of cooking to build confidence in their capability to prepare    Cooking School weekly topics:  Adding Flavor- Sodium-Free  Fast and Healthy Breakfasts  Powerhouse Plant-Based Proteins  Satisfying Salads and Dressings  Simple Sides and Sauces  International Cuisine-Spotlight on the [employer] Zones  Delicious Desserts  Savory  Soups  [employer] - Meals in a Astronomer Appetizers and Snacks  Comforting Weekend Breakfasts  One-Pot Wonders   Fast Evening Meals  Landscape architect Your Pritikin Plate  WORKSHOPS   Healthy Mindset (Psychosocial):  Focused Goals, Sustainable Changes Clinical staff led group instruction and group discussion with PowerPoint presentation and patient guidebook. To enhance the learning environment the use of posters, models and videos may be added. Patients will be able to apply effective goal setting strategies to establish at least one personal goal, and then take consistent, meaningful action toward that goal. They will learn to identify common barriers to achieving personal goals and develop strategies to overcome them. Patients will also gain an understanding of how our mind-set can impact our ability to achieve goals and the importance of cultivating a positive and growth-oriented mind-set. The purpose of this lesson is to provide patients with a deeper understanding of how to set and achieve personal goals, as well as the tools and strategies needed to overcome common obstacles which may arise along the way.  From Head to Heart: The Power of a Healthy Outlook  Clinical staff led group instruction and group discussion with PowerPoint presentation and patient guidebook. To enhance the learning environment the use of posters, models and videos may be added. Patients will be able to recognize and describe the impact of emotions and mood on physical health. They will discover the importance of self-care and explore self-care practices which may work for them. Patients will also learn how to utilize the 4 C's to cultivate a healthier outlook and better manage stress and challenges. The purpose of this lesson is to demonstrate to patients how a healthy outlook is an essential part of maintaining good health, especially as they continue their cardiac rehab journey.  Healthy  Sleep for a Healthy Heart Clinical staff led group instruction and group discussion with PowerPoint presentation and patient guidebook. To enhance the learning environment the use of posters, models and videos may be added. At the conclusion of this workshop, patients will be able to demonstrate knowledge of the importance of sleep to overall health, well-being, and quality of life. They will understand the symptoms of, and treatments for, common sleep disorders. Patients will also be able to identify daytime and nighttime behaviors which impact sleep, and they will be able to apply these tools to help manage sleep-related challenges. The purpose of this lesson is to provide patients with a general overview of sleep and outline the importance of quality sleep. Patients will learn about a few of the most common sleep disorders. Patients will also be introduced to the concept of "sleep hygiene," and discover ways to self-manage certain sleeping problems through simple daily behavior changes. Finally, the workshop will motivate  patients by clarifying the links between quality sleep and their goals of heart-healthy living.   Recognizing and Reducing Stress Clinical staff led group instruction and group discussion with PowerPoint presentation and patient guidebook. To enhance the learning environment the use of posters, models and videos may be added. At the conclusion of this workshop, patients will be able to understand the types of stress reactions, differentiate between acute and chronic stress, and recognize the impact that chronic stress has on their health. They will also be able to apply different coping mechanisms, such as reframing negative self-talk. Patients will have the opportunity to practice a variety of stress management techniques, such as deep abdominal breathing, progressive muscle relaxation, and/or guided imagery.  The purpose of this lesson is to educate patients on the role of stress in their  lives and to provide healthy techniques for coping with it.  Learning Barriers/Preferences:  Learning Barriers/Preferences - 12/04/22 1412       Learning Barriers/Preferences   Learning Preferences Group Instruction;Individual Instruction;Skilled Demonstration;Verbal Instruction             Education Topics:  Knowledge Questionnaire Score:  Knowledge Questionnaire Score - 12/04/22 1412       Knowledge Questionnaire Score   Pre Score 22/24             Core Components/Risk Factors/Patient Goals at Admission:  Personal Goals and Risk Factors at Admission - 12/04/22 1413       Core Components/Risk Factors/Patient Goals on Admission    Weight Management Yes;Obesity;Weight Loss    Intervention Weight Management: Develop a combined nutrition and exercise program designed to reach desired caloric intake, while maintaining appropriate intake of nutrient and fiber, sodium and fats, and appropriate energy expenditure required for the weight goal.;Weight Management: Provide education and appropriate resources to help participant work on and attain dietary goals.;Weight Management/Obesity: Establish reasonable short term and long term weight goals.;Obesity: Provide education and appropriate resources to help participant work on and attain dietary goals.    Admit Weight 292 lb 15.9 oz (132.9 kg)    Expected Outcomes Short Term: Continue to assess and modify interventions until short term weight is achieved;Long Term: Adherence to nutrition and physical activity/exercise program aimed toward attainment of established weight goal;Weight Loss: Understanding of general recommendations for a balanced deficit meal plan, which promotes 1-2 lb weight loss per week and includes a negative energy balance of [id number] kcal/d;Understanding recommendations for meals to include 15-35% energy as protein, 25-35% energy from fat, 35-60% energy from carbohydrates, less than 200mg  of dietary cholesterol, 20-35  gm of total fiber daily;Understanding of distribution of calorie intake throughout the day with the consumption of 4-5 meals/snacks    Heart Failure Yes    Intervention Provide a combined exercise and nutrition program that is supplemented with education, support and counseling about heart failure. Directed toward relieving symptoms such as shortness of breath, decreased exercise tolerance, and extremity edema.    Expected Outcomes Improve functional capacity of life;Short term: Attendance in program 2-3 days a week with increased exercise capacity. Reported lower sodium intake. Reported increased fruit and vegetable intake. Reports medication compliance.;Short term: Daily weights obtained and reported for increase. Utilizing diuretic protocols set by physician.;Long term: Adoption of self-care skills and reduction of barriers for early signs and symptoms recognition and intervention leading to self-care maintenance.    Hypertension Yes    Intervention Monitor prescription use compliance.;Provide education on lifestyle modifcations including regular physical activity/exercise, weight management, moderate sodium restriction and increased consumption  of fresh fruit, vegetables, and low fat dairy, alcohol moderation, and smoking cessation.    Expected Outcomes Short Term: Continued assessment and intervention until BP is < 140/66mm HG in hypertensive participants. < 130/54mm HG in hypertensive participants with diabetes, heart failure or chronic kidney disease.;Long Term: Maintenance of blood pressure at goal levels.    Stress Yes    Intervention Offer individual and/or small group education and counseling on adjustment to heart disease, stress management and health-related lifestyle change. Teach and support self-help strategies.;Refer participants experiencing significant psychosocial distress to appropriate mental health specialists for further evaluation and treatment. When possible, include family members  and significant others in education/counseling sessions.    Expected Outcomes Short Term: Participant demonstrates changes in health-related behavior, relaxation and other stress management skills, ability to obtain effective social support, and compliance with psychotropic medications if prescribed.;Long Term: Emotional wellbeing is indicated by absence of clinically significant psychosocial distress or social isolation.             Core Components/Risk Factors/Patient Goals Review:    Core Components/Risk Factors/Patient Goals at Discharge (Final Review):    ITP Comments:  ITP Comments     Row Name 12/04/22 1320           ITP Comments Dr. Armanda Magic medical director. Introduction to pritikin education/ intensive cardiac rehab. Initital orientation packet reviewed with patient.                Comments: Participant attended orientation for the cardiac rehabilitation program on  12/04/2022  to perform initial intake and exercise walk test. Patient introduced to the [facility] education and orientation packet was reviewed. Completed 6-minute walk test, measurements, initial ITP, and exercise prescription. Vital signs stable. Telemetry-normal sinus rhythm inverted QRS PVCs, asymptomatic.   Service time was from 1314 to 1500.  Jonna Coup, MS, ACSM-CEP 12/04/2022 3:25 PM

## 2022-12-04 NOTE — Progress Notes (Signed)
Cardiac Rehab Medication Review   Does the patient  feel that his/her medications are working for him/her?  YES  Has the patient been experiencing any side effects to the medications prescribed?  YES   Does the patient measure his/her own blood pressure or blood glucose at home?  YES   Does the patient have any problems obtaining medications due to transportation or finances?  NO  Understanding of regimen: excellent Understanding of indications: excellent Potential of compliance: excellent    Comments: Art understands his medications and regime well. He checks his BP twice a day and weighs every morning.     Jonna Coup, MS, ACSM-CEP 12/04/2022 2:14 PM

## 2022-12-07 NOTE — Pre-Procedure Instructions (Signed)
Spoke with patient regarding procedure scheduled with anesthesia.  Anesthesia requires certain medications to be held for 7 days.  Patient states he has not had any zepbound for a couple months.  He is aware to not take any for 7 days before the procedure.

## 2022-12-08 ENCOUNTER — Encounter (HOSPITAL_COMMUNITY)
Admission: RE | Admit: 2022-12-08 | Discharge: 2022-12-08 | Disposition: A | Discharge status: Home / Self Care | Payer: BC Managed Care – PPO | Source: Ambulatory Visit | Attending: Internal Medicine | Admitting: Internal Medicine

## 2022-12-08 ENCOUNTER — Other Ambulatory Visit: Payer: Self-pay | Admitting: Pharmacist

## 2022-12-08 DIAGNOSIS — I5022 Chronic systolic (congestive) heart failure: Secondary | ICD-10-CM

## 2022-12-08 DIAGNOSIS — Z5189 Encounter for other specified aftercare: Secondary | ICD-10-CM | POA: Diagnosis not present

## 2022-12-08 DIAGNOSIS — I110 Hypertensive heart disease with heart failure: Secondary | ICD-10-CM | POA: Diagnosis not present

## 2022-12-08 LAB — GLUCOSE, CAPILLARY
Glucose-Capillary: 107 mg/dL — ABNORMAL HIGH (ref 70–99)
Glucose-Capillary: 109 mg/dL — ABNORMAL HIGH (ref 70–99)

## 2022-12-08 MED ORDER — ZEPBOUND 2.5 MG/0.5ML ~~LOC~~ SOAJ: 2.5000 mg | SUBCUTANEOUS | 0 refills | Status: DC

## 2022-12-08 NOTE — Progress Notes (Signed)
Daily Session Note  Patient Details  Name: Harold Park MRN: [id number] Date of Birth: 06-11-1965 Referring Provider:   Flowsheet Row INTENSIVE CARDIAC REHAB ORIENT from 12/04/2022 in [facility]  Referring Provider Arvilla Meres, MD       Encounter Date: 12/08/2022  Check In:  Session Check In - 12/08/22 1335       Check-In   Supervising physician immediately available to respond to emergencies [facility] MD immediately available    Physician(s) Robin Searing NP    Location [facility]    Staff Present Gladstone Lighter, RN, Zachery Conch, MS, ACSM-CEP, Exercise Physiologist;Jetta Dan Humphreys BS, ACSM-CEP, Exercise Physiologist;Johnny Hale Bogus, MS, Exercise Physiologist    Virtual Visit No    Medication changes reported     No    Fall or balance concerns reported    No    Tobacco Cessation No Change    Warm-up and Cool-down Performed as group-led instruction    Resistance Training Performed Yes    VAD Patient? No    PAD/SET Patient? No      Pain Assessment   Currently in Pain? No/denies    Pain Score 0-No pain    Multiple Pain Sites No             Capillary Blood Glucose: Results for orders placed or performed during the hospital encounter of 12/04/22 (from the past 24 hour(s))  Glucose, capillary     Status: Abnormal   Collection Time: 12/08/22 12:54 PM  Result Value Ref Range   Glucose-Capillary 107 (H) 70 - 99 mg/dL  Glucose, capillary     Status: Abnormal   Collection Time: 12/08/22  1:33 PM  Result Value Ref Range   Glucose-Capillary 109 (H) 70 - 99 mg/dL      Social History   Tobacco Use  Smoking Status Never  Smokeless Tobacco Never    Goals Met:  Exercise tolerated well No report of concerns or symptoms today Strength training completed today  Goals Unmet:  Not Applicable  Comments: Pt started cardiac rehab today.  Pt tolerated light exercise without difficulty. VSS,  telemetry-Sinus Rhythm, first degree heart block, asymptomatic.  Medication list reconciled. Pt denies barriers to medicaiton compliance.  PSYCHOSOCIAL ASSESSMENT:  PHQ-5. Pt exhibits positive coping skills, hopeful outlook with supportive family. No psychosocial needs identified at this time, no psychosocial interventions necessary.    Pt enjoys golf, dog and carpentry.   Pt oriented to exercise equipment and routine.    Understanding verbalized.    Dr. Armanda Magic is Medical Director for Cardiac Rehab at [facility].

## 2022-12-09 ENCOUNTER — Encounter (HOSPITAL_COMMUNITY): Payer: Self-pay | Admitting: Cardiology

## 2022-12-09 ENCOUNTER — Other Ambulatory Visit (HOSPITAL_COMMUNITY): Payer: Self-pay | Admitting: Cardiology

## 2022-12-09 ENCOUNTER — Other Ambulatory Visit (HOSPITAL_COMMUNITY): Payer: Self-pay

## 2022-12-09 MED ORDER — AMIODARONE HCL 200 MG PO TABS
400.0000 mg | ORAL_TABLET | Freq: Two times a day (BID) | ORAL | 3 refills | Status: DC
Start: 2022-12-09 — End: 2023-01-13
  Filled 2022-12-09 – 2022-12-28 (×2): qty 120, 30d supply, fill #0

## 2022-12-09 NOTE — Telephone Encounter (Signed)
Patient called to request refill of amio Refill returned to pharmacy  Pt aware   Pt also requested pre procedure instructions for upcoming ablation Advised to contact [facility] for details and further questions-Dr [name] Copy of ablation instructions sent via my chart as courtesy

## 2022-12-10 ENCOUNTER — Encounter (HOSPITAL_COMMUNITY): Payer: BC Managed Care – PPO

## 2022-12-10 ENCOUNTER — Other Ambulatory Visit (HOSPITAL_COMMUNITY): Payer: Self-pay

## 2022-12-10 ENCOUNTER — Ambulatory Visit (HOSPITAL_COMMUNITY)
Admission: RE | Admit: 2022-12-10 | Discharge: 2022-12-10 | Disposition: A | Discharge status: Home / Self Care | Payer: BC Managed Care – PPO | Source: Ambulatory Visit | Attending: Cardiovascular Disease | Admitting: Cardiovascular Disease

## 2022-12-10 DIAGNOSIS — I4892 Unspecified atrial flutter: Secondary | ICD-10-CM | POA: Diagnosis not present

## 2022-12-10 MED ORDER — IOHEXOL 350 MG/ML SOLN
100.0000 mL | Freq: Once | INTRAVENOUS | Status: AC | PRN
  Administered 2022-12-10: 100 mL via INTRAVENOUS

## 2022-12-12 ENCOUNTER — Encounter (HOSPITAL_COMMUNITY)
Admission: RE | Admit: 2022-12-12 | Discharge: 2022-12-12 | Disposition: A | Discharge status: Home / Self Care | Payer: BC Managed Care – PPO | Source: Ambulatory Visit | Attending: Internal Medicine | Admitting: Internal Medicine

## 2022-12-12 DIAGNOSIS — I5022 Chronic systolic (congestive) heart failure: Secondary | ICD-10-CM | POA: Diagnosis not present

## 2022-12-12 DIAGNOSIS — Z5189 Encounter for other specified aftercare: Secondary | ICD-10-CM | POA: Diagnosis not present

## 2022-12-15 ENCOUNTER — Encounter (HOSPITAL_COMMUNITY)
Admission: RE | Admit: 2022-12-15 | Discharge: 2022-12-15 | Disposition: A | Discharge status: Home / Self Care | Payer: BC Managed Care – PPO | Source: Ambulatory Visit | Attending: Internal Medicine | Admitting: Internal Medicine

## 2022-12-15 DIAGNOSIS — I5022 Chronic systolic (congestive) heart failure: Secondary | ICD-10-CM

## 2022-12-15 DIAGNOSIS — G4733 Obstructive sleep apnea (adult) (pediatric): Secondary | ICD-10-CM | POA: Diagnosis not present

## 2022-12-15 DIAGNOSIS — Z5189 Encounter for other specified aftercare: Secondary | ICD-10-CM | POA: Diagnosis not present

## 2022-12-15 NOTE — Telephone Encounter (Signed)
Ablation arranged with [facility] ([name]) will forward for additional instructions

## 2022-12-15 NOTE — Progress Notes (Signed)
Advanced Heart Failure Clinic Note  PCP: Dr. Caryl Never  Primary Cardiologist: Dr. Gala Romney  HPI: 57 y.o. 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   Echo 2011: EF 30-35%. Cath 2011 with normal cors.   Echo 10/17: EF 35-40%.   We saw him for the first time in May 2017.   Underwent DC-CV on 06/19/17 for recurrent AF Seen in AF Clinic on 07/02/17. Felt not to be ideal candidate for AF ablation   Echo 12/21 EF 40-45%   CT angio in 2019 aortic root was 4.6 cm   CT chest 03/06/20 to evaluate hilar fullness seen on CXR. Interval development of numerous enlarged mediastinal and bilateral hilar lymph nodes. Underwent bronchoscopy with biopsy of mediastinal nodes. 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 Treated with prednisone.    CT coronary 03/06/20. Calcium score 0. Poor arterial imaging but no obvious CAD   Back in AFL at f/u 09/29/22. Diuretics increased. Underwent DCCV to SR on 07/25. Seen by Afib clinic on 08/02 and in AFL with RVR. Ranexa stopped in anticipation of Tikosyn loading.   Presented 10/14/22  with AF with RVR. He was sedated and underwent cardioversion in the ED .  Following cardioversion he developed shock. Transferred to [facility] for admit . Bedside POCUS with EF 15%. Echo with EF 25-30%, RV moderately down. Had recurrent Afib with RVR and PVCs. DCCV attempted X 2 on 08/11. Amiodarone load resumed and had repeat cardioversion on 10/23/22 with conversion to SR.   Readmitted 10/27/22 with recurrent AFL with RVR and acute on chronic CHF with low-output. Converted to SR with IV amiodarone. He required inotrope support with milrinone. RHC on milrinone with elevated filling pressures and low-output Fick CI 2.1 and TD CI 1.7. He was diuresed with IV lasix and metolazone. Course further complicated by AKI on CKD and PNA for which he was treated with antibiotics.   Had recurrent atrial flutter and underwent DCCV to SR on 09/05.   He is  here today for f/u. He has been doing well since discharge. Home weight stable at 285 lb. He has not detected any atrial fibrillation on his apple watch. No dyspnea, orthopnea, PND or lower extremity edema. He has been limiting fluid and sodium intake. Taking all medications as prescribed.   ROS: All systems negative except as listed in HPI, PMH and Problem List.  SH:  Social History   Socioeconomic History   Marital status: Married    Spouse name: Not on file   Number of children: 3   Years of education: Not on file   Highest education level: Not on file  Occupational History   Occupation: realtor  Tobacco Use   Smoking status: Never   Smokeless tobacco: Never  Vaping Use   Vaping status: Never Used  Substance and Sexual Activity   Alcohol use: Yes    Comment: occasional   Drug use: Never   Sexual activity: Not on file  Other Topics Concern   Not on file  Social History Narrative   Not on file   Social Determinants of Health   Financial Resource Strain: Not on file  Food Insecurity: No Food Insecurity (11/11/2022)   Hunger Vital Sign    Worried About Running Out of Food in the Last Year: Never true    Ran Out of Food in the Last Year: Never true  Transportation Needs: No Transportation Needs (11/11/2022)   PRAPARE - Transportation  Lack of Transportation (Medical): No    Lack of Transportation (Non-Medical): No  Physical Activity: Not on file  Stress: Not on file  Social Connections: Not on file  Intimate Partner Violence: Not At Risk (11/05/2022)   Humiliation, Afraid, Rape, and Kick questionnaire    Fear of Current or Ex-Partner: No    Emotionally Abused: No    Physically Abused: No    Sexually Abused: No    FH:  Family History  Problem Relation Age of Onset   Breast cancer Mother    Cancer Father        blood cancer   Hypertension Other    Heart disease Other    Stomach cancer Neg Hx    Colon cancer Neg Hx    Esophageal cancer Neg Hx    Pancreatic  cancer Neg Hx    Rectal cancer Neg Hx     Past Medical History:  Diagnosis Date   Chronic bronchitis    HTN (hypertension)    NICM (nonischemic cardiomyopathy) (HCC)    Obesity (BMI 30-39.9) 12/04/2015   OSA (obstructive sleep apnea) 08/19/2015   Severe with AHI 64/hr now on CPAP at 13cm H2O   PAF (paroxysmal atrial fibrillation) (HCC)    Systolic CHF, chronic (HCC)     Current Outpatient Medications  Medication Sig Dispense Refill   albuterol (VENTOLIN HFA) 108 (90 Base) MCG/ACT inhaler TAKE 2 PUFFS BY MOUTH EVERY 6 HOURS AS NEEDED FOR WHEEZE OR SHORTNESS OF BREATH 8.5 each 2   amiodarone (PACERONE) 200 MG tablet Take 2 tablets (400 mg total) by mouth 2 (two) times daily. 120 tablet 3   digoxin (LANOXIN) 0.125 MG tablet Take 1 tablet (0.125 mg total) by mouth daily. 30 tablet 5   DULoxetine (CYMBALTA) 20 MG capsule TAKE 1 CAPSULE BY MOUTH EVERY DAY 90 capsule 3   ELIQUIS 5 MG TABS tablet TAKE 1 TABLET BY MOUTH TWICE A DAY 60 tablet 6   empagliflozin (JARDIANCE) 10 MG TABS tablet Take 1 tablet (10 mg total) by mouth daily. 30 tablet 5   Magnesium 250 MG TABS Take 250 mg by mouth every evening. Gummy     ondansetron (ZOFRAN) 4 MG tablet Take 1 tablet (4 mg total) by mouth every 8 (eight) hours as needed for nausea or vomiting. 60 tablet 1   potassium chloride SA (KLOR-CON M) 20 MEQ tablet Take 2 tablets (40 mEq total) by mouth 2 (two) times daily. 360 tablet 3   spironolactone (ALDACTONE) 25 MG tablet Take 1 tablet (25 mg total) by mouth daily. 30 tablet 5   tirzepatide (ZEPBOUND) 2.5 MG/0.5ML Pen Inject 2.5 mg into the skin once a week. 2 mL 0   torsemide (DEMADEX) 20 MG tablet Take 3 tablets (60 mg total) by mouth 2 (two) times daily. 270 tablet 5   No current facility-administered medications for this visit.    There were no vitals filed for this visit.   PHYSICAL EXAM:  General:  Well appearing.  HEENT: normal Neck: supple. No JVD Cor: PMI normal. Regular rate & rhythm. No  rubs, gallops or murmurs. Lungs: clear Abdomen: soft, nontender, nondistended.  Extremities: no cyanosis, clubbing, rash, edema Neuro: alert & orientedx3. Affect pleasant.   ECG: Sinus rhythm with 2st degree AVB, 93 bpm   ASSESSMENT & PLAN:  1. Chronic Systolic Heart Failure  - Echo 2011: EF 30-35%. Cath 2011 with normal cors. - Echo 10/17: EF 35-40%. - CMRI 2017 LVEF 20%. No LGE - Echo 12/21 EF  40-45%. Coronary CTA normal cors, calcium score 0   - Cardiac PET 2023- no evidence for cardiac sarcoid or inflammatory. LVEF 38%.   - recent admit 10/14/22, developed cardiogenic shock post DCCV. Echo EF 20-25% and RV moderately reduced - Readmitted 10/27/22 w/ ADHF w/ low-output in setting of recurrent AFL w/ RVR. Required inotrope support with milrinone - RHC on 10/30/22 with: RA 21, PA 56/30 (42), PCWP 30 v waves to 55, Fick CO/CI 5.9/2.1, TD CO/CI 4.6/1.7. PAPi 1.2 - Volume looks good. Home weight stable 285 lb. Continue Torsemide 60 BID. Can use extra 40 mg Torsemide PRN. May eventually need intermittent metolazone.  - Continue Spiro 25 mg daily - Continue Jardiance 10 mg daily - Continue digoxin 0.125 mg daily, dig level 0.2 on 08/31 - No beta blocker with recent low-output - BP soft. Consider low dose Losartan next visit if renal function stable. - Repeat echo in a couple of months if maintaining SR   2. Recurrent AFL and Afib with RVR - Now on po amiodarone 400 mg BID (he did not decrease dose on 09/05, we instructed him to stay on higher dose). SR today. TSH 8.9, Free T3 1.5 and Free T4 1.2 in 08/24. Recheck thyroid function studies at f/u. - Multiple admissions with RVR leading to decompensated HF w/ low-output - Has f/u with EP next week. There is concern that Afib ablation may not be successful. Considering AVJ ablation and BiV ICD - Continue anticoagulation with Eliquis   3. AKI on CKD IIIB - Previously creatinine baseline ~ 1.2, now low 2s after recent admissions with  low-output HF - Scr 2.01 on 09/06 - Continue Jardiance   4. OSA  - Continue CPAP nightly.    5. Type 2 DM - A1c 6.4%.  - Continue Jardiance  6. Obesity -Had been on Terzepatide but stopped d/t cost (hadn't met deductible). He is interested in restarting the medication now that deductible is met.  -Will refer back to Pharmacy.  Follow-up: 4 weeks with APP

## 2022-12-16 ENCOUNTER — Encounter (HOSPITAL_COMMUNITY): Payer: Self-pay

## 2022-12-16 ENCOUNTER — Encounter: Payer: Self-pay | Admitting: Pharmacist

## 2022-12-16 ENCOUNTER — Other Ambulatory Visit (HOSPITAL_COMMUNITY): Payer: Self-pay

## 2022-12-16 ENCOUNTER — Ambulatory Visit (HOSPITAL_COMMUNITY)
Admission: RE | Admit: 2022-12-16 | Discharge: 2022-12-16 | Disposition: A | Discharge status: Home / Self Care | Payer: BC Managed Care – PPO | Source: Ambulatory Visit | Attending: Family Medicine | Admitting: Family Medicine

## 2022-12-16 ENCOUNTER — Telehealth: Payer: Self-pay | Admitting: Pharmacy Technician

## 2022-12-16 VITALS — BP 106/80 | HR 91 | Wt 296.8 lb

## 2022-12-16 DIAGNOSIS — I480 Paroxysmal atrial fibrillation: Secondary | ICD-10-CM | POA: Diagnosis not present

## 2022-12-16 DIAGNOSIS — I428 Other cardiomyopathies: Secondary | ICD-10-CM | POA: Insufficient documentation

## 2022-12-16 DIAGNOSIS — I4892 Unspecified atrial flutter: Secondary | ICD-10-CM

## 2022-12-16 DIAGNOSIS — E1122 Type 2 diabetes mellitus with diabetic chronic kidney disease: Secondary | ICD-10-CM | POA: Insufficient documentation

## 2022-12-16 DIAGNOSIS — G4733 Obstructive sleep apnea (adult) (pediatric): Secondary | ICD-10-CM | POA: Insufficient documentation

## 2022-12-16 DIAGNOSIS — Z7901 Long term (current) use of anticoagulants: Secondary | ICD-10-CM | POA: Insufficient documentation

## 2022-12-16 DIAGNOSIS — Z79899 Other long term (current) drug therapy: Secondary | ICD-10-CM | POA: Diagnosis not present

## 2022-12-16 DIAGNOSIS — Z7984 Long term (current) use of oral hypoglycemic drugs: Secondary | ICD-10-CM | POA: Insufficient documentation

## 2022-12-16 DIAGNOSIS — E669 Obesity, unspecified: Secondary | ICD-10-CM

## 2022-12-16 DIAGNOSIS — R42 Dizziness and giddiness: Secondary | ICD-10-CM | POA: Insufficient documentation

## 2022-12-16 DIAGNOSIS — N1832 Chronic kidney disease, stage 3b: Secondary | ICD-10-CM | POA: Insufficient documentation

## 2022-12-16 DIAGNOSIS — I130 Hypertensive heart and chronic kidney disease with heart failure and stage 1 through stage 4 chronic kidney disease, or unspecified chronic kidney disease: Secondary | ICD-10-CM | POA: Diagnosis not present

## 2022-12-16 DIAGNOSIS — Z6835 Body mass index (BMI) 35.0-35.9, adult: Secondary | ICD-10-CM | POA: Insufficient documentation

## 2022-12-16 DIAGNOSIS — I5022 Chronic systolic (congestive) heart failure: Secondary | ICD-10-CM | POA: Insufficient documentation

## 2022-12-16 DIAGNOSIS — I493 Ventricular premature depolarization: Secondary | ICD-10-CM | POA: Diagnosis not present

## 2022-12-16 LAB — COMPREHENSIVE METABOLIC PANEL
ALT: 22 U/L (ref 0–44)
AST: 22 U/L (ref 15–41)
Albumin: 3.3 g/dL — ABNORMAL LOW (ref 3.5–5.0)
Alkaline Phosphatase: 59 U/L (ref 38–126)
Anion gap: 14 (ref 5–15)
BUN: 26 mg/dL — ABNORMAL HIGH (ref 6–20)
CO2: 27 mmol/L (ref 22–32)
Calcium: 9.2 mg/dL (ref 8.9–10.3)
Chloride: 98 mmol/L (ref 98–111)
Creatinine, Ser: 1.53 mg/dL — ABNORMAL HIGH (ref 0.61–1.24)
GFR, Estimated: 52 mL/min — ABNORMAL LOW (ref >60)
Glucose, Bld: 82 mg/dL (ref 70–99)
Potassium: 4.2 mmol/L (ref 3.5–5.1)
Sodium: 139 mmol/L (ref 135–145)
Total Bilirubin: 0.4 mg/dL (ref 0.3–1.2)
Total Protein: 6.8 g/dL (ref 6.5–8.1)

## 2022-12-16 LAB — TSH: TSH: 3.058 u[IU]/mL (ref 0.350–4.500)

## 2022-12-16 LAB — T4, FREE: Free T4: 1.07 ng/dL (ref 0.61–1.12)

## 2022-12-16 NOTE — Patient Instructions (Addendum)
Thank you for coming in today  If you had labs drawn today, any labs that are abnormal the clinic will call you No news is good news  Medications: No change   Follow up appointments:  Your physician recommends that you schedule a follow-up appointment in:  4 weeks in clinic  Echocardiogram in 2-3 months  4 months With Dr. Gala Romney  You will receive a reminder letter in the mail a few months in advance. If you don't receive a letter, please call our office to schedule the follow-up appointment.    Do the following things EVERYDAY: Weigh yourself in the morning before breakfast. Write it down and keep it in a log. Take your medicines as prescribed Eat low salt foods--Limit salt (sodium) to 2000 mg per day.  Stay as active as you can everyday Limit all fluids for the day to less than 2 liters   At the Advanced [facility] Failure Clinic, you and your health needs are our priority. As part of our continuing mission to provide you with exceptional heart care, we have created designated Provider Care Teams. These Care Teams include your primary Cardiologist (physician) and Advanced Practice Providers (APPs- Physician Assistants and Nurse Practitioners) who all work together to provide you with the care you need, when you need it.   You may see any of the following providers on your designated Care Team at your next follow up: Dr Arvilla Meres Dr Marca Ancona Dr. Marcos Eke, NP Robbie Lis, [place] [facility] [place], [place] Brynda Peon, NP Karle Plumber, PharmD   Please be sure to bring in all your medications bottles to every appointment.    Thank you for choosing [facility]  If you have any questions or concerns before your next appointment please send [place] a message through [place] or call our office at [phone].    TO LEAVE A MESSAGE FOR THE NURSE SELECT OPTION 2, PLEASE LEAVE A MESSAGE INCLUDING: YOUR  NAME DATE OF BIRTH CALL BACK NUMBER REASON FOR CALL**this is important as we prioritize the call backs  YOU WILL RECEIVE A CALL BACK THE SAME DAY AS LONG AS YOU CALL BEFORE 4:00 PM

## 2022-12-16 NOTE — Progress Notes (Signed)
ReDS Vest / Clip - 12/16/22 1400       ReDS Vest / Clip   Station Marker D    Ruler Value 41.5    ReDS Value Range Low volume    ReDS Actual Value 25

## 2022-12-16 NOTE — Telephone Encounter (Signed)
-----   Message from Olene Floss sent at 12/16/2022  2:02 PM EDT ----- Please do PA for Zepbound 2.5mg . thanks

## 2022-12-16 NOTE — Telephone Encounter (Signed)
Pharmacy Patient Advocate Encounter  Received notification from [facility] that Prior Authorization for zepbound has been APPROVED from 12/16/22 to 06/16/23. Ran test claim, Copay is $0.00- no copay- one month. This test claim was processed through [facility]- copay amounts may vary at other pharmacies due to pharmacy/plan contracts, or as the patient moves through the different stages of their insurance plan.   PA #/Case ID/Reference #: [id number]

## 2022-12-16 NOTE — Telephone Encounter (Signed)
Pharmacy Patient Advocate Encounter   Received notification from  [facility] charts  that prior authorization for zepbound 2.5 is required/requested.   Insurance verification completed.   The patient is insured through [facility] .   Per test claim: PA required; PA submitted to [employer] via CoverMyMeds Key/confirmation #/[id number] Status is pending

## 2022-12-16 NOTE — Pre-Procedure Instructions (Signed)
Instructed patient on the following items: Arrival time 0930 Nothing to eat or drink after midnight No meds AM of procedure Responsible person to drive you home and stay with you for 24 hrs  Have you missed any doses of anti-coagulant Eliquis- takes twice a day, hasn't missed any doses.  Don't take in the morning.

## 2022-12-17 ENCOUNTER — Other Ambulatory Visit: Payer: Self-pay

## 2022-12-17 ENCOUNTER — Ambulatory Visit (HOSPITAL_COMMUNITY): Payer: BC Managed Care – PPO

## 2022-12-17 ENCOUNTER — Ambulatory Visit (HOSPITAL_COMMUNITY)
Admission: RE | Admit: 2022-12-17 | Discharge: 2022-12-17 | Disposition: A | Discharge status: Home / Self Care | Payer: BC Managed Care – PPO | Attending: Cardiovascular Disease | Admitting: Cardiovascular Disease

## 2022-12-17 ENCOUNTER — Encounter (HOSPITAL_COMMUNITY)
Admission: RE | Disposition: A | Discharge status: Home / Self Care | Payer: Self-pay | Source: Home / Self Care | Attending: Cardiovascular Disease

## 2022-12-17 DIAGNOSIS — I5022 Chronic systolic (congestive) heart failure: Secondary | ICD-10-CM | POA: Insufficient documentation

## 2022-12-17 DIAGNOSIS — Z6834 Body mass index (BMI) 34.0-34.9, adult: Secondary | ICD-10-CM | POA: Insufficient documentation

## 2022-12-17 DIAGNOSIS — E669 Obesity, unspecified: Secondary | ICD-10-CM | POA: Diagnosis not present

## 2022-12-17 DIAGNOSIS — G4730 Sleep apnea, unspecified: Secondary | ICD-10-CM | POA: Insufficient documentation

## 2022-12-17 DIAGNOSIS — D6869 Other thrombophilia: Secondary | ICD-10-CM | POA: Diagnosis not present

## 2022-12-17 DIAGNOSIS — I110 Hypertensive heart disease with heart failure: Secondary | ICD-10-CM | POA: Insufficient documentation

## 2022-12-17 DIAGNOSIS — Z7901 Long term (current) use of anticoagulants: Secondary | ICD-10-CM | POA: Diagnosis not present

## 2022-12-17 DIAGNOSIS — I4819 Other persistent atrial fibrillation: Secondary | ICD-10-CM | POA: Diagnosis not present

## 2022-12-17 DIAGNOSIS — I4892 Unspecified atrial flutter: Secondary | ICD-10-CM | POA: Diagnosis not present

## 2022-12-17 DIAGNOSIS — I4891 Unspecified atrial fibrillation: Secondary | ICD-10-CM | POA: Diagnosis not present

## 2022-12-17 HISTORY — PX: ATRIAL FIBRILLATION ABLATION: EP1191

## 2022-12-17 LAB — POCT ACTIVATED CLOTTING TIME: Activated Clotting Time: 379 s

## 2022-12-17 SURGERY — ATRIAL FIBRILLATION ABLATION
Anesthesia: General

## 2022-12-17 MED ORDER — HEPARIN SODIUM (PORCINE) 1000 UNIT/ML IJ SOLN
INTRAMUSCULAR | Status: DC | PRN
Start: 2022-12-17 — End: 2022-12-17
  Administered 2022-12-17: 23000 [IU] via INTRAVENOUS

## 2022-12-17 MED ORDER — PROTAMINE SULFATE 10 MG/ML IV SOLN
INTRAVENOUS | Status: DC | PRN
Start: 2022-12-17 — End: 2022-12-17
  Administered 2022-12-17: 50 mg via INTRAVENOUS

## 2022-12-17 MED ORDER — DEXMEDETOMIDINE HCL IN NACL 80 MCG/20ML IV SOLN
INTRAVENOUS | Status: DC | PRN
Start: 2022-12-17 — End: 2022-12-17
  Administered 2022-12-17: 12 ug via INTRAVENOUS

## 2022-12-17 MED ORDER — PHENYLEPHRINE HCL-NACL 20-0.9 MG/250ML-% IV SOLN: INTRAVENOUS | Status: DC | PRN

## 2022-12-17 MED ORDER — SUGAMMADEX SODIUM 200 MG/2ML IV SOLN
INTRAVENOUS | Status: DC | PRN
  Administered 2022-12-17: 300 mg via INTRAVENOUS

## 2022-12-17 MED ORDER — DEXAMETHASONE SODIUM PHOSPHATE 10 MG/ML IJ SOLN
INTRAMUSCULAR | Status: DC | PRN
  Administered 2022-12-17: 10 mg via INTRAVENOUS

## 2022-12-17 MED ORDER — ONDANSETRON HCL 4 MG/2ML IJ SOLN
INTRAMUSCULAR | Status: DC | PRN
  Administered 2022-12-17: 4 mg via INTRAVENOUS

## 2022-12-17 MED ORDER — HEPARIN (PORCINE) IN NACL 1000-0.9 UT/500ML-% IV SOLN
INTRAVENOUS | Status: DC | PRN
  Administered 2022-12-17 (×3): 500 mL

## 2022-12-17 MED ORDER — PHENYLEPHRINE 80 MCG/ML (10ML) SYRINGE FOR IV PUSH (FOR BLOOD PRESSURE SUPPORT)
PREFILLED_SYRINGE | INTRAVENOUS | Status: DC | PRN
  Administered 2022-12-17: 80 ug via INTRAVENOUS

## 2022-12-17 MED ORDER — ATROPINE SULFATE 1 MG/10ML IJ SOSY
PREFILLED_SYRINGE | INTRAMUSCULAR | Status: DC | PRN
Start: 2022-12-17 — End: 2022-12-17
  Administered 2022-12-17: 1 mg via INTRAVENOUS

## 2022-12-17 MED ORDER — SODIUM CHLORIDE 0.9% FLUSH: 3.0000 mL | INTRAVENOUS | Status: DC | PRN

## 2022-12-17 MED ORDER — ONDANSETRON HCL 4 MG/2ML IJ SOLN: 4.0000 mg | Freq: Four times a day (QID) | INTRAMUSCULAR | Status: DC | PRN

## 2022-12-17 MED ORDER — LIDOCAINE 2% (20 MG/ML) 5 ML SYRINGE
INTRAMUSCULAR | Status: DC | PRN
  Administered 2022-12-17: 100 mg via INTRAVENOUS

## 2022-12-17 MED ORDER — HEPARIN SODIUM (PORCINE) 1000 UNIT/ML IJ SOLN
INTRAMUSCULAR | Status: AC
  Filled 2022-12-17: qty 10

## 2022-12-17 MED ORDER — ROCURONIUM BROMIDE 10 MG/ML (PF) SYRINGE
PREFILLED_SYRINGE | INTRAVENOUS | Status: DC | PRN
  Administered 2022-12-17: 90 mg via INTRAVENOUS
  Administered 2022-12-17: 20 mg via INTRAVENOUS
  Administered 2022-12-17: 10 mg via INTRAVENOUS
  Administered 2022-12-17: 20 mg via INTRAVENOUS

## 2022-12-17 MED ORDER — FENTANYL CITRATE (PF) 250 MCG/5ML IJ SOLN
INTRAMUSCULAR | Status: DC | PRN
  Administered 2022-12-17 (×2): 50 ug via INTRAVENOUS

## 2022-12-17 MED ORDER — GLYCOPYRROLATE 0.2 MG/ML IJ SOLN
INTRAMUSCULAR | Status: DC | PRN
Start: 2022-12-17 — End: 2022-12-17
  Administered 2022-12-17: .2 mg via INTRAVENOUS

## 2022-12-17 MED ORDER — MIDAZOLAM HCL 2 MG/2ML IJ SOLN
INTRAMUSCULAR | Status: DC | PRN
  Administered 2022-12-17: 2 mg via INTRAVENOUS

## 2022-12-17 MED ORDER — SODIUM CHLORIDE 0.9 % IV SOLN: INTRAVENOUS | Status: DC

## 2022-12-17 MED ORDER — PROPOFOL 10 MG/ML IV BOLUS
INTRAVENOUS | Status: DC | PRN
  Administered 2022-12-17: 150 mg via INTRAVENOUS

## 2022-12-17 MED ORDER — PROPOFOL 500 MG/50ML IV EMUL
INTRAVENOUS | Status: DC | PRN
Start: 2022-12-17 — End: 2022-12-17
  Administered 2022-12-17: 70 ug/kg/min via INTRAVENOUS
  Administered 2022-12-17: 50 ug/kg/min via INTRAVENOUS

## 2022-12-17 MED ORDER — HEPARIN SODIUM (PORCINE) 1000 UNIT/ML IJ SOLN
INTRAMUSCULAR | Status: DC | PRN
  Administered 2022-12-17: 500 [IU] via INTRAVENOUS

## 2022-12-17 MED ORDER — ACETAMINOPHEN 325 MG PO TABS: 650.0000 mg | ORAL_TABLET | ORAL | Status: DC | PRN

## 2022-12-17 SURGICAL SUPPLY — 24 items
BAG SNAP BAND KOVER 36X36 (MISCELLANEOUS) IMPLANT
BLANKET WARM UNDERBOD FULL ACC (MISCELLANEOUS) ×1 IMPLANT
CABLE PFA RX CATH CONN (CABLE) IMPLANT
CATH FARAWAVE ABLATION 31 (CATHETERS) IMPLANT
CATH OCTARAY 2.0 F 3-3-3-3-3 (CATHETERS) IMPLANT
CATH SMTCH THERMOCOOL SF FJ (CATHETERS) IMPLANT
CATH SOUNDSTAR ECO 8FR (CATHETERS) IMPLANT
CATH WEBSTER BI DIR CS D-F CRV (CATHETERS) IMPLANT
CLOSURE PERCLOSE PROSTYLE (VASCULAR PRODUCTS) IMPLANT
COVER SWIFTLINK CONNECTOR (BAG) ×1 IMPLANT
DEVICE CLOSURE MYNXGRIP 6/7F (Vascular Products) IMPLANT
DILATOR VESSEL 38 20CM 16FR (INTRODUCER) IMPLANT
GUIDEWIRE INQWIRE 1.5J.035X260 (WIRE) IMPLANT
INQWIRE 1.5J .035X260CM (WIRE) ×1
PACK EP LATEX FREE (CUSTOM PROCEDURE TRAY) ×1
PACK EP LF (CUSTOM PROCEDURE TRAY) ×1 IMPLANT
PAD DEFIB RADIO PHYSIO CONN (PAD) ×1 IMPLANT
PATCH CARTO3 (PAD) IMPLANT
SHEATH 9FR PRELUDE SNAP 13 (SHEATH) IMPLANT
SHEATH FARADRIVE STEERABLE (SHEATH) IMPLANT
SHEATH PINNACLE 8F 10CM (SHEATH) IMPLANT
SHEATH PROBE COVER 6X72 (BAG) IMPLANT
SHEATH WIRE KIT BAYLIS SL1 (KITS) IMPLANT
TUBING SMART ABLATE COOLFLOW (TUBING) IMPLANT

## 2022-12-17 NOTE — Transfer of Care (Signed)
Immediate Anesthesia Transfer of Care Note  Patient: Harold Park  Procedure(s) Performed: ATRIAL FIBRILLATION ABLATION  Patient Location: PACU and Cath Lab  Anesthesia Type:General  Level of Consciousness: awake  Airway & Oxygen Therapy: Patient Spontanous Breathing  Post-op Assessment: Report given to RN  Post vital signs: Reviewed and stable  Last Vitals:  Vitals Value Taken Time  BP 102/75 12/17/22 1407  Temp    Pulse 84 12/17/22 1410  Resp 24 12/17/22 1410  SpO2 95 % 12/17/22 1410  Vitals shown include unfiled device data.  Last Pain:  Vitals:   12/17/22 1406  TempSrc:   PainSc: 0-No pain         Complications: No notable events documented.

## 2022-12-17 NOTE — Anesthesia Postprocedure Evaluation (Signed)
Anesthesia Post Note  Patient: Harold Park  Procedure(s) Performed: ATRIAL FIBRILLATION ABLATION     Patient location during evaluation: PACU Anesthesia Type: General Level of consciousness: awake and alert Pain management: pain level controlled Vital Signs Assessment: post-procedure vital signs reviewed and stable Respiratory status: spontaneous breathing, nonlabored ventilation, respiratory function stable and patient connected to nasal cannula oxygen Cardiovascular status: blood pressure returned to baseline and stable Postop Assessment: no apparent nausea or vomiting Anesthetic complications: no   There were no known notable events for this encounter.  Last Vitals:  Vitals:   12/17/22 1500 12/17/22 1515  BP: 105/78 106/81  Pulse: 85 89  Resp: (!) 21 (!) 28  Temp:    SpO2: 94% 93%    Last Pain:  Vitals:   12/17/22 1440  TempSrc: Oral  PainSc: 0-No pain                 Earl Lites P Kash Davie

## 2022-12-17 NOTE — Interval H&P Note (Signed)
History and Physical Interval Note:  12/17/2022 10:58 AM  Harold Park  has presented today for surgery, with the diagnosis of atrial fibrillation.  The various methods of treatment have been discussed with the patient and family. After consideration of risks, benefits and other options for treatment, the patient has consented to  Procedure(s): ATRIAL FIBRILLATION ABLATION (N/A) as a surgical intervention.  The patient's history has been reviewed, patient examined, no change in status, stable for surgery.  I have reviewed the patient's chart and labs.  Questions were answered to the patient's satisfaction.     [name]

## 2022-12-17 NOTE — Addendum Note (Signed)
Addendum  created 12/17/22 1605 by Vena Austria, CRNA   Attestation recorded in [place], Intraprocedure Attestations filed

## 2022-12-17 NOTE — Anesthesia Preprocedure Evaluation (Signed)
Anesthesia Evaluation  Patient identified by MRN, date of birth, ID band Patient awake    Reviewed: Allergy & Precautions, NPO status , Patient's Chart, lab work & pertinent test results, reviewed documented beta blocker date and time   Airway Mallampati: III       Dental no notable dental hx. (+) Teeth Intact, Dental Advisory Given   Pulmonary shortness of breath and with exertion, sleep apnea and Continuous Positive Airway Pressure Ventilation    Pulmonary exam normal breath sounds clear to auscultation       Cardiovascular hypertension, Pt. on medications +CHF  Normal cardiovascular exam+ dysrhythmias Atrial Fibrillation  Rhythm:Regular Rate:Normal  Amiodarone therapy- last dose yesterday Digoxin therapy- last dose yesterday NICM  Cardiac Cath 10/30/22 IMPRESSION: 1. Severely elevated pre and post capillary filling pressures.  2. Moderate elevated PA mean in the setting of volume overload.  3. Large V waves on PCWP tracing 2/2 volume overload, noncompliant LA and functional MR.  4. Hemodynamic parameters consistent with suboptimal RV function.    RECOMMENDATIONS: 1. Low threshold to escalate to MCS.  2. Milrinone 0.82mcg/kg/min + aggressive IV diuresis.    Echo 10/28/22 1. Left ventricular ejection fraction, by estimation, is 25 to 30%. The  left ventricle has severely decreased function. The left ventricle  demonstrates global hypokinesis. The left ventricular internal cavity size  was severely dilated. Left ventricular  diastolic function could not be evaluated.   2. Right ventricular systolic function is moderately reduced. The right  ventricular size is normal. Tricuspid regurgitation signal is inadequate  for assessing PA pressure.   3. Left atrial size was severely dilated.   4. Right atrial size was mild to moderately dilated.   5. The mitral valve is degenerative. Mild mitral valve regurgitation. No  evidence of  mitral stenosis.   6. The aortic valve is normal in structure. Aortic valve regurgitation is  not visualized. No aortic stenosis is present.   7. The inferior vena cava is dilated in size with <50% respiratory  variability, suggesting right atrial pressure of 15 mmHg.    EKG 11/27/22 Sinus rhythm with 1st degree A-V block with Fusion complexes Non-specific intra-ventricular conduction block Lateral infarct , age undetermined    Neuro/Psych  PSYCHIATRIC DISORDERS  Depression    negative neurological ROS     GI/Hepatic negative GI ROS, Neg liver ROS,,,  Endo/Other  negative endocrine ROS    Renal/GU negative Renal ROS  negative genitourinary   Musculoskeletal   Abdominal  (+) + obese  Peds  Hematology Eliquis therapy- last dose yesterday   Anesthesia Other Findings   Reproductive/Obstetrics                              Anesthesia Physical Anesthesia Plan  ASA: 3  Anesthesia Plan: General   Post-op Pain Management: Minimal or no pain anticipated   Induction: Intravenous  PONV Risk Score and Plan:   Airway Management Planned:   Additional Equipment:   Intra-op Plan:   Post-operative Plan:   Informed Consent:   Plan Discussed with:   Anesthesia Plan Comments:          Anesthesia Quick Evaluation

## 2022-12-17 NOTE — Anesthesia Procedure Notes (Signed)
Procedure Name: Intubation Date/Time: 12/17/2022 11:50 AM  Performed by: Vena Austria, CRNAPre-anesthesia Checklist: Patient identified, Emergency Drugs available, Suction available, Patient being monitored and Timeout performed Patient Re-evaluated:Patient Re-evaluated prior to induction Oxygen Delivery Method: Circle system utilized Preoxygenation: Pre-oxygenation with 100% oxygen Induction Type: IV induction Laryngoscope Size: Glidescope and 3 Grade View: Grade I Tube type: Oral Tube size: 7.0 mm Number of attempts: 1 Airway Equipment and Method: Stylet Placement Confirmation: ETT inserted through vocal cords under direct vision, positive ETCO2, CO2 detector and breath sounds checked- equal and bilateral Dental Injury: Teeth and Oropharynx as per pre-operative assessment

## 2022-12-17 NOTE — Discharge Instructions (Addendum)

## 2022-12-17 NOTE — Progress Notes (Signed)
Patient and wife was given discharge instructions. Both verbalized understanding. 

## 2022-12-17 NOTE — Progress Notes (Signed)
Pt arrived from EP Lab. Pt assessment completed and charted, see assessment. Pt recovered from anesthesia. Pt bilateral groins clean, dry, and intact. No hematoma. No oozing. Short stay to receive pt shortly. Vitals are charted, see vitals sign assessment. Pt weaned to 2L via [place]. Pt vss. Will continue to monitor pt under our care until pt has transitioned to short stay.

## 2022-12-18 ENCOUNTER — Encounter (HOSPITAL_COMMUNITY): Payer: Self-pay | Admitting: Cardiovascular Disease

## 2022-12-19 ENCOUNTER — Encounter (HOSPITAL_COMMUNITY): Payer: BC Managed Care – PPO

## 2022-12-22 ENCOUNTER — Encounter (HOSPITAL_COMMUNITY): Payer: BC Managed Care – PPO

## 2022-12-22 ENCOUNTER — Other Ambulatory Visit (HOSPITAL_COMMUNITY): Payer: Self-pay

## 2022-12-22 ENCOUNTER — Telehealth (HOSPITAL_COMMUNITY): Payer: Self-pay | Admitting: *Deleted

## 2022-12-22 NOTE — Telephone Encounter (Signed)
Pt as requested. Thank you. When will he be ok to resume?   52 mins MS Levi Aland, NP he can resume after one week   Will call and notify the patient he can return to exercise on 12/26/22 post procedure.Thayer Headings RN BSN

## 2022-12-23 ENCOUNTER — Other Ambulatory Visit (HOSPITAL_COMMUNITY): Payer: Self-pay

## 2022-12-23 MED ORDER — ZEPBOUND 2.5 MG/0.5ML ~~LOC~~ SOAJ
2.5000 mg | SUBCUTANEOUS | 0 refills | Status: DC
  Filled 2022-12-23 – 2022-12-30 (×4): qty 2, 28d supply, fill #0

## 2022-12-24 ENCOUNTER — Encounter (HOSPITAL_COMMUNITY): Payer: BC Managed Care – PPO

## 2022-12-25 ENCOUNTER — Other Ambulatory Visit: Payer: Self-pay

## 2022-12-25 ENCOUNTER — Other Ambulatory Visit (HOSPITAL_COMMUNITY): Payer: Self-pay

## 2022-12-26 ENCOUNTER — Encounter (HOSPITAL_COMMUNITY): Payer: BC Managed Care – PPO

## 2022-12-28 ENCOUNTER — Other Ambulatory Visit (HOSPITAL_COMMUNITY): Payer: Self-pay

## 2022-12-29 ENCOUNTER — Encounter (HOSPITAL_COMMUNITY)
Admission: RE | Admit: 2022-12-29 | Discharge: 2022-12-29 | Disposition: A | Discharge status: Home / Self Care | Payer: BC Managed Care – PPO | Source: Ambulatory Visit | Attending: Internal Medicine | Admitting: Internal Medicine

## 2022-12-29 ENCOUNTER — Other Ambulatory Visit (HOSPITAL_COMMUNITY): Payer: Self-pay

## 2022-12-29 ENCOUNTER — Other Ambulatory Visit: Payer: Self-pay

## 2022-12-29 DIAGNOSIS — Z5189 Encounter for other specified aftercare: Secondary | ICD-10-CM | POA: Diagnosis not present

## 2022-12-29 DIAGNOSIS — I5022 Chronic systolic (congestive) heart failure: Secondary | ICD-10-CM

## 2022-12-30 ENCOUNTER — Other Ambulatory Visit (HOSPITAL_COMMUNITY): Payer: Self-pay

## 2022-12-30 NOTE — Progress Notes (Signed)
QUALITY OF LIFE SCORE REVIEW  Pt completed Quality of Life survey as a participant in Cardiac Rehab.  Scores 21.0 or below are considered low.  Pt score very low in several areas Overall 2.034, Health and Function 15.57, socioeconomic 26.43, physiological and spiritual 18.79, family 28.30. Patient quality of life slightly altered by physical constraints which limits ability to perform as prior to recent cardiac illness.  Harold Park says his depression and energy level has improved. 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  Will continue to monitor and intervene as necessary.  Will forward quality of life questionnaire to Harold Park's primary care provider Dr Caryl Never as requested.Thayer Headings RN BSN

## 2022-12-30 NOTE — Progress Notes (Signed)
Cardiac Individual Treatment Plan  Patient Details  Name: Harold Park MRN: [id number] Date of Birth: 1965-10-24 Referring Provider:   Flowsheet Row INTENSIVE CARDIAC REHAB ORIENT from 12/04/2022 in [facility]  Referring Provider Arvilla Meres, MD       Initial Encounter Date:  Flowsheet Row INTENSIVE CARDIAC REHAB ORIENT from 12/04/2022 in [facility]  Date 12/04/22       Visit Diagnosis: Heart failure, chronic systolic (HCC)  Patient's Home Medications on Admission:  Current Outpatient Medications:    albuterol (VENTOLIN HFA) 108 (90 Base) MCG/ACT inhaler, TAKE 2 PUFFS BY MOUTH EVERY 6 HOURS AS NEEDED FOR WHEEZE OR SHORTNESS OF BREATH, Disp: 8.5 each, Rfl: 2   amiodarone (PACERONE) 200 MG tablet, Take 2 tablets (400 mg total) by mouth 2 (two) times daily., Disp: 120 tablet, Rfl: 3   digoxin (LANOXIN) 0.125 MG tablet, Take 1 tablet (0.125 mg total) by mouth daily., Disp: 30 tablet, Rfl: 5   DULoxetine (CYMBALTA) 20 MG capsule, TAKE 1 CAPSULE BY MOUTH EVERY DAY, Disp: 90 capsule, Rfl: 3   ELIQUIS 5 MG TABS tablet, TAKE 1 TABLET BY MOUTH TWICE A DAY, Disp: 60 tablet, Rfl: 6   empagliflozin (JARDIANCE) 10 MG TABS tablet, Take 1 tablet (10 mg total) by mouth daily. (Patient not taking: Reported on 12/16/2022), Disp: 30 tablet, Rfl: 5   Magnesium 250 MG TABS, Take 250 mg by mouth every evening. Gummy, Disp: , Rfl:    ondansetron (ZOFRAN) 4 MG tablet, Take 1 tablet (4 mg total) by mouth every 8 (eight) hours as needed for nausea or vomiting., Disp: 60 tablet, Rfl: 1   potassium chloride SA (KLOR-CON M) 20 MEQ tablet, Take 2 tablets (40 mEq total) by mouth 2 (two) times daily., Disp: 360 tablet, Rfl: 3   spironolactone (ALDACTONE) 25 MG tablet, Take 1 tablet (25 mg total) by mouth daily., Disp: 30 tablet, Rfl: 5   tirzepatide (ZEPBOUND) 2.5 MG/0.5ML Pen, Inject 2.5 mg  into the skin once a week., Disp: 2 mL, Rfl: 0   torsemide (DEMADEX) 20 MG tablet, Patient takes 2 tablets by mouth twice a day., Disp: , Rfl:   Past Medical History: Past Medical History:  Diagnosis Date   Chronic bronchitis    HTN (hypertension)    NICM (nonischemic cardiomyopathy) (HCC)    Obesity (BMI 30-39.9) 12/04/2015   OSA (obstructive sleep apnea) 08/19/2015   Severe with AHI 64/hr now on CPAP at 13cm H2O   PAF (paroxysmal atrial fibrillation) (HCC)    Systolic CHF, chronic (HCC)     Tobacco Use: Social History   Tobacco Use  Smoking Status Never  Smokeless Tobacco Never    Labs: Review Flowsheet  More data exists      Latest Ref Rng & Units 11/05/2022 11/06/2022 11/07/2022 11/08/2022 11/13/2022  Labs for ITP Cardiac and Pulmonary Rehab  TCO2 22 - 32 mmol/L - - - - 23   O2 Saturation % 71  80.9  68.9  84.7  -    Details            Capillary Blood Glucose: Lab Results  Component Value Date   GLUCAP 109 (H) 12/08/2022   GLUCAP 107 (H) 12/08/2022   GLUCAP 114 (H) 11/08/2022   GLUCAP 69 (L) 11/08/2022   GLUCAP 73 11/08/2022     Exercise Target Goals: Exercise Program Goal: Individual exercise prescription set using results from initial  6 min walk test and THRR while considering  patient's activity barriers and safety.   Exercise Prescription Goal: Initial exercise prescription builds to 30-45 minutes a day of aerobic activity, 2-3 days per week.  Home exercise guidelines will be given to patient during program as part of exercise prescription that the participant will acknowledge.  Activity Barriers & Risk Stratification:  Activity Barriers & Cardiac Risk Stratification - 12/04/22 1415       Activity Barriers & Cardiac Risk Stratification   Activity Barriers Decreased Ventricular Function    Cardiac Risk Stratification High   <5 METs on            6 Minute Walk:  6 Minute Walk     Row Name 12/04/22 1511         6 Minute Walk   Phase  Initial     Distance 1490 feet     Walk Time 6 minutes     # of Rest Breaks 0     MPH 2.82     METS 3.56     RPE 11     Perceived Dyspnea  0     VO2 Peak 11.75     Symptoms No     Resting HR 87 bpm     Resting BP 100/68     Resting Oxygen Saturation  98 %     Exercise Oxygen Saturation  during 6 min walk 95 %     Max Ex. HR 109 bpm     Max Ex. BP 122/78     2 Minute Post BP 114/86              Oxygen Initial Assessment:   Oxygen Re-Evaluation:   Oxygen Discharge (Final Oxygen Re-Evaluation):   Initial Exercise Prescription:  Initial Exercise Prescription - 12/04/22 1500       Date of Initial Exercise RX and Referring Provider   Date 12/04/22    Referring Provider Arvilla Meres, MD    Expected Discharge Date 02/25/23      Treadmill   MPH 2    Grade 0    Minutes 15    METs 3      Bike   Level 1.5    Watts 60    Minutes 15    METs 3      Prescription Details   Frequency (times per week) 3    Duration Progress to 30 minutes of continuous aerobic without signs/symptoms of physical distress      Intensity   THRR 40-80% of Max Heartrate 65-130    Ratings of Perceived Exertion 11-13    Perceived Dyspnea 0-4      Progression   Progression Continue progressive overload as per policy without signs/symptoms or physical distress.      Resistance Training   Training Prescription Yes    Weight 4    Reps 10-15             Perform Capillary Blood Glucose checks as needed.  Exercise Prescription Changes:   Exercise Prescription Changes     Row Name 12/08/22 1250 12/29/22 1400           Response to Exercise   Blood Pressure (Admit) 118/88 114/78      Blood Pressure (Exercise) 122/88 138/84      Blood Pressure (Exit) 122/78 108/78      Heart Rate (Admit) 98 bpm 85 bpm      Heart Rate (Exercise) 112 bpm 114 bpm  Heart Rate (Exit) 104 bpm 97 bpm      Rating of Perceived Exertion (Exercise) 12 11      Symptoms None None      Comments  Off to a good start with exercise. Reviewed METs      Duration Continue with 30 min of aerobic exercise without signs/symptoms of physical distress. Continue with 30 min of aerobic exercise without signs/symptoms of physical distress.      Intensity THRR unchanged THRR unchanged        Progression   Progression Continue to progress workloads to maintain intensity without signs/symptoms of physical distress. Continue to progress workloads to maintain intensity without signs/symptoms of physical distress.      Average METs 2.8 3.05        Resistance Training   Training Prescription Yes Yes      Weight 4 4      Reps 10-15 10-15      Time 10 Minutes 10 Minutes        Interval Training   Interval Training No No        Treadmill   MPH 2 2.5      Grade 0 0      Minutes 15 15      METs 2.53 2.91        Bike   Level 2 2.8      Watts 46 --      Minutes 15 15      METs 3 3.2               Exercise Comments:   Exercise Comments     Row Name 12/08/22 1345 12/24/22 1525 12/29/22 1437       Exercise Comments Patient tolerated first session of exercise well without symptoms. Introduced to exercise and stretching routine. Pt was due for MET review today but is out after cardioversion. Will complete upon patient return. Reviewed METs. Pt making good progress. Pt increased on both modalities today without complaints.              Exercise Goals and Review:   Exercise Goals     Row Name 12/04/22 1346             Exercise Goals   Increase Physical Activity Yes       Intervention Provide advice, education, support and counseling about physical activity/exercise needs.;Develop an individualized exercise prescription for aerobic and resistive training based on initial evaluation findings, risk stratification, comorbidities and participant's personal goals.       Expected Outcomes Short Term: Attend rehab on a regular basis to increase amount of physical activity.;Long Term:  Exercising regularly at least 3-5 days a week.;Long Term: Add in home exercise to make exercise part of routine and to increase amount of physical activity.       Increase Strength and Stamina Yes       Intervention Provide advice, education, support and counseling about physical activity/exercise needs.;Develop an individualized exercise prescription for aerobic and resistive training based on initial evaluation findings, risk stratification, comorbidities and participant's personal goals.       Expected Outcomes Short Term: Increase workloads from initial exercise prescription for resistance, speed, and METs.;Short Term: Perform resistance training exercises routinely during rehab and add in resistance training at home;Long Term: Improve cardiorespiratory fitness, muscular endurance and strength as measured by increased METs and functional capacity ( )       Able to understand and use rate of perceived exertion (RPE) scale Yes  Intervention Provide education and explanation on how to use RPE scale       Expected Outcomes Short Term: Able to use RPE daily in rehab to express subjective intensity level;Long Term:  Able to use RPE to guide intensity level when exercising independently       Knowledge and understanding of Target Heart Rate Range (THRR) Yes       Intervention Provide education and explanation of THRR including how the numbers were predicted and where they are located for reference       Expected Outcomes Short Term: Able to state/look up THRR;Short Term: Able to use daily as guideline for intensity in rehab;Long Term: Able to use THRR to govern intensity when exercising independently       Understanding of Exercise Prescription Yes       Intervention Provide education, explanation, and written materials on patient's individual exercise prescription       Expected Outcomes Short Term: Able to explain program exercise prescription;Long Term: Able to explain home exercise prescription  to exercise independently                Exercise Goals Re-Evaluation :  Exercise Goals Re-Evaluation     Row Name 12/08/22 1345             Exercise Goal Re-Evaluation   Exercise Goals Review Increase Physical Activity;Increase Strength and Stamina;Able to understand and use rate of perceived exertion (RPE) scale       Comments Patient able to understand and use RPE  scale appropriately.       Expected Outcomes Progress workloads as tolerated to increase cardiorespiratory fitness.                Discharge Exercise Prescription (Final Exercise Prescription Changes):  Exercise Prescription Changes - 12/29/22 1400       Response to Exercise   Blood Pressure (Admit) 114/78    Blood Pressure (Exercise) 138/84    Blood Pressure (Exit) 108/78    Heart Rate (Admit) 85 bpm    Heart Rate (Exercise) 114 bpm    Heart Rate (Exit) 97 bpm    Rating of Perceived Exertion (Exercise) 11    Symptoms None    Comments Reviewed METs    Duration Continue with 30 min of aerobic exercise without signs/symptoms of physical distress.    Intensity THRR unchanged      Progression   Progression Continue to progress workloads to maintain intensity without signs/symptoms of physical distress.    Average METs 3.05      Resistance Training   Training Prescription Yes    Weight 4    Reps 10-15    Time 10 Minutes      Interval Training   Interval Training No      Treadmill   MPH 2.5    Grade 0    Minutes 15    METs 2.91      Bike   Level 2.8    Minutes 15    METs 3.2             Nutrition:  Target Goals: Understanding of nutrition guidelines, daily intake of sodium 1500mg , cholesterol 200mg , calories 30% from fat and 7% or less from saturated fats, daily to have 5 or more servings of fruits and vegetables.  Biometrics:  Pre Biometrics - 12/04/22 1319       Pre Biometrics   Waist Circumference 51.5 inches    Hip Circumference 47 inches    Waist to  Hip Ratio 1.1 %     Triceps Skinfold 28 mm    % Body Fat 37.7 %    Grip Strength 54 kg    Flexibility 11.5 in    Single Leg Stand 14.68 seconds              Nutrition Therapy Plan and Nutrition Goals:  Nutrition Therapy & Goals - 12/10/22 1600       Nutrition Therapy   Diet Heart Healthy Diet      Personal Nutrition Goals   Nutrition Goal Patient to identify strategies for reducing cardiovascular risk by attending the Pritikin education and nutrition series weekly.    Personal Goal #2 Patient to improve diet quality by using the plate method as a guide for meal planning to include lean protein/plant protein, fruits, vegetables, whole grains, nonfat dairy as part of a well-balanced diet.    Personal Goal #3 Patient to reduce sodium intake to 1500mg  per day    Comments Harold Park has medical history HTN, CHF, Afib, OSA. He has starting making some dietary changes including reduced sodium intake. His A1c remains in a prediabetic; he is taking zepbound, jardiance. Patient will benefit from participation in intensive cardiac rehab for nutrition, exercise, and lifestyle modification.      Intervention Plan   Intervention Prescribe, educate and counsel regarding individualized specific dietary modifications aiming towards targeted core components such as weight, hypertension, lipid management, diabetes, heart failure and other comorbidities.;Nutrition handout(s) given to patient.    Expected Outcomes Short Term Goal: Understand basic principles of dietary content, such as calories, fat, sodium, cholesterol and nutrients.;Long Term Goal: Adherence to prescribed nutrition plan.             Nutrition Assessments:  MEDIFICTS Score Key: >=70 Need to make dietary changes  40-70 Heart Healthy Diet <= 40 Therapeutic Level Cholesterol Diet    Picture Your Plate Scores: <78 Unhealthy dietary pattern with much room for improvement. 41-50 Dietary pattern unlikely to meet recommendations for good health and  room for improvement. 51-60 More healthful dietary pattern, with some room for improvement.  >60 Healthy dietary pattern, although there may be some specific behaviors that could be improved.    Nutrition Goals Re-Evaluation:  Nutrition Goals Re-Evaluation     Row Name 12/10/22 1600             Goals   Current Weight 293 lb 3.4 oz (133 kg)       Comment A1c 6.4; no lipid panel available.       Expected Outcome Harold Park has medical history HTN, CHF, Afib, OSA. He has starting making some dietary changes including reduced sodium intake. His A1c remains in a prediabetic; he is taking zepbound, jardiance. Patient will benefit from participation in intensive cardiac rehab for nutrition, exercise, and lifestyle modification.                Nutrition Goals Re-Evaluation:  Nutrition Goals Re-Evaluation     Row Name 12/10/22 1600             Goals   Current Weight 293 lb 3.4 oz (133 kg)       Comment A1c 6.4; no lipid panel available.       Expected Outcome Harold Park has medical history HTN, CHF, Afib, OSA. He has starting making some dietary changes including reduced sodium intake. His A1c remains in a prediabetic; he is taking zepbound, jardiance. Patient will benefit from participation in intensive cardiac rehab for nutrition, exercise, and lifestyle  modification.                Nutrition Goals Discharge (Final Nutrition Goals Re-Evaluation):  Nutrition Goals Re-Evaluation - 12/10/22 1600       Goals   Current Weight 293 lb 3.4 oz (133 kg)    Comment A1c 6.4; no lipid panel available.    Expected Outcome Harold Park has medical history HTN, CHF, Afib, OSA. He has starting making some dietary changes including reduced sodium intake. His A1c remains in a prediabetic; he is taking zepbound, jardiance. Patient will benefit from participation in intensive cardiac rehab for nutrition, exercise, and lifestyle modification.             Psychosocial: Target Goals: Acknowledge  presence or absence of significant depression and/or stress, maximize coping skills, provide positive support system. Participant is able to verbalize types and ability to use techniques and skills needed for reducing stress and depression.  Initial Review & Psychosocial Screening:  Initial Psych Review & Screening - 12/04/22 1409       Initial Review   Current issues with Current Depression;Current Psychotropic Meds;Current Stress Concerns    Source of Stress Concerns Unable to participate in former interests or hobbies;Occupation;Financial;Family;Chronic Illness    Comments Art shared that he has had some depression since his hospitalization and stress due to working with heart issues and the financial stress of that. No current feelings of anxiety. Art feels his cymbalta somewhat works for him, denies any need for additional resources at this time.      Family Dynamics   Good Support System? Yes   Has wife for support     Barriers   Psychosocial barriers to participate in program The patient should benefit from training in stress management and relaxation.      Screening Interventions   Interventions To provide support and resources with identified psychosocial needs;Provide feedback about the scores to participant;Encouraged to exercise    Expected Outcomes Short Term goal: Utilizing psychosocial counselor, staff and physician to assist with identification of specific Stressors or current issues interfering with healing process. Setting desired goal for each stressor or current issue identified.;Long Term Goal: Stressors or current issues are controlled or eliminated.;Short Term goal: Identification and review with participant of any Quality of Life or Depression concerns found by scoring the questionnaire.;Long Term goal: The participant improves quality of Life and PHQ9 Scores as seen by post scores and/or verbalization of changes             Quality of Life Scores:  Quality of Life  - 12/04/22 1423       Quality of Life   Select Quality of Life      Quality of Life Scores   Health/Function Pre 15.57 %    Socioeconomic Pre 26.43 %    Psych/Spiritual Pre 18.79 %    Family Pre 28.3 %    GLOBAL Pre 20.34 %            Scores of 19 and below usually indicate a poorer quality of life in these areas.  A difference of  2-3 points is a clinically meaningful difference.  A difference of 2-3 points in the total score of the Quality of Life Index has been associated with significant improvement in overall quality of life, self-image, physical symptoms, and general health in studies assessing change in quality of life.  PHQ-9: Review Flowsheet       12/04/2022 11/29/2021  Depression screen PHQ 2/9  Decreased Interest 1 1  Down, Depressed, Hopeless 1 0  PHQ - 2 Score 2 1  Altered sleeping 0 -  Tired, decreased energy 2 -  Change in appetite 0 -  Feeling bad or failure about yourself  1 -  Trouble concentrating 0 -  Moving slowly or fidgety/restless 0 -  Suicidal thoughts 0 -  PHQ-9 Score 5 -  Difficult doing work/chores Somewhat difficult -    Details           Interpretation of Total Score  Total Score Depression Severity:  1-4 = Minimal depression, 5-9 = Mild depression, 10-14 = Moderate depression, 15-19 = Moderately severe depression, 20-27 = Severe depression   Psychosocial Evaluation and Intervention:   Psychosocial Re-Evaluation:  Psychosocial Re-Evaluation     Row Name 12/10/22 0839 12/30/22 1418           Psychosocial Re-Evaluation   Current issues with Current Psychotropic Meds;Current Depression;Current Stress Concerns Current Psychotropic Meds;Current Depression;Current Stress Concerns      Comments No concerns voiced during first day of exercise at cardiac rehab. Will review quality of life questionaire in the upcoming week Reviewed quailityof life on 12/29/22. Art says his depression and energy level has improved. 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      Expected Outcomes Art will have decreased or controlled depression/ stressors upon completion of cardiac rehab Art will have decreased or controlled depression/ stressors upon completion of cardiac rehab      Interventions Stress management education;Encouraged to attend Cardiac Rehabilitation for the exercise;Relaxation education Stress management education;Encouraged to attend Cardiac Rehabilitation for the exercise;Relaxation education      Continue Psychosocial Services  Follow up required by staff Follow up required by staff        Initial Review   Source of Stress Concerns Chronic Illness;Retirement/disability;Financial;Occupation Chronic Illness;Retirement/disability;Financial;Occupation      Comments Will continue to monitor and offer support as needed Will continue to monitor and offer support as needed               Psychosocial Discharge (Final Psychosocial Re-Evaluation):  Psychosocial Re-Evaluation - 12/30/22 1418       Psychosocial Re-Evaluation   Current issues with Current Psychotropic Meds;Current Depression;Current Stress Concerns    Comments Reviewed quailityof life on 12/29/22. Art says his depression and energy level has improved. 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    Expected Outcomes Art will have decreased or controlled depression/ stressors upon completion of cardiac rehab    Interventions Stress management education;Encouraged to attend Cardiac Rehabilitation for the exercise;Relaxation education    Continue Psychosocial Services  Follow up required by staff      Initial Review   Source of Stress Concerns Chronic Illness;Retirement/disability;Financial;Occupation    Comments Will continue to monitor and offer support as needed              Vocational Rehabilitation: Provide vocational rehab assistance to qualifying candidates.   Vocational Rehab Evaluation & Intervention:  Vocational Rehab - 12/04/22 1412       Initial Vocational Rehab Evaluation & Intervention   Assessment shows need for Vocational Rehabilitation No   Art is working            Education: Education Goals: Education classes will be provided on a weekly basis, covering  required topics. Participant will state understanding/return demonstration of topics presented.    Education     Row Name 12/08/22 1600     Education   Cardiac Education Topics Pritikin   Glass blower/designer Nutrition   Nutrition Workshop Targeting Your Nutrition Priorities   Instruction Review Code 1- Verbalizes Understanding   Class Start Time 1400   Class Stop Time 1440   Class Time Calculation (min) 40 min    Row Name 12/12/22 1500     Education   Cardiac Education Topics Pritikin   Writer General Education   General Education Hypertension and Heart Disease   Instruction Review Code 1- Verbalizes Understanding   Class Start Time 1400   Class Stop Time 1440   Class Time Calculation (min) 40 min    Row Name 12/15/22 1500     Education   Cardiac Education Topics Pritikin   Licensed conveyancer Nutrition   Nutrition Dining Out - Part 1   Instruction Review Code 1- Verbalizes Understanding   Class Start Time 1400   Class Stop Time 1445   Class Time Calculation (min) 45 min    Row Name 12/29/22 1500     Education   Cardiac Education Topics Pritikin   Glass blower/designer Nutrition   Nutrition Workshop Fueling a Forensic psychologist   Instruction Review Code 1- Teaching laboratory technician Start Time 1400   Class Stop Time 1445   Class Time Calculation (min) 45 min             Core Videos: Exercise    Move It!  Clinical staff conducted group or individual video education with verbal and written material and guidebook.  Patient learns the recommended Pritikin exercise program. Exercise with the goal of living a long, healthy life. Some of the health benefits of exercise include controlled diabetes, healthier blood pressure levels, improved cholesterol levels, improved heart and lung capacity, improved sleep, and better body composition. Everyone should speak with their doctor before starting or changing an exercise routine.  Biomechanical Limitations Clinical staff conducted group or individual video education with verbal and written material and guidebook.  Patient learns how biomechanical limitations can impact exercise and how we can mitigate and possibly overcome limitations to have an impactful and balanced exercise routine.  Body Composition Clinical staff conducted group or individual video education with verbal and written material and guidebook.  Patient learns that body composition (ratio of muscle mass to fat mass) is a key component to assessing overall fitness, rather than body weight alone. Increased fat mass, especially visceral belly fat, can put [place] at increased risk for metabolic syndrome, type 2 diabetes, heart disease, and even death. It is recommended to combine diet and exercise (cardiovascular and resistance training) to improve your body composition. Seek guidance from your physician and exercise physiologist before implementing an exercise routine.  Exercise Action Plan Clinical staff conducted group or individual video education with verbal and written material and guidebook.  Patient learns the recommended strategies to achieve and enjoy long-term exercise adherence, including variety, self-motivation, self-efficacy, and positive decision making. Benefits of exercise include fitness, good health, weight management, more energy,  better sleep, less stress, and overall well-being.  Medical   Heart Disease Risk Reduction Clinical staff conducted group or individual video education with verbal and written material and guidebook.  Patient learns our heart is our most vital organ as it circulates oxygen, nutrients, white blood cells, and hormones throughout the entire body, and carries waste away. Data supports a plant-based eating plan like the Pritikin Program for its effectiveness in slowing progression of and reversing heart disease. The video provides a number of recommendations to address heart disease.   Metabolic Syndrome and Belly Fat  Clinical staff conducted group or individual video education with verbal and written material and guidebook.  Patient learns what metabolic syndrome is, how it leads to heart disease, and how one can reverse it and keep it from coming back. You have metabolic syndrome if you have 3 of the following 5 criteria: abdominal obesity, high blood pressure, high triglycerides, low HDL cholesterol, and high blood sugar.  Hypertension and Heart Disease Clinical staff conducted group or individual video education with verbal and written material and guidebook.  Patient learns that high blood pressure, or hypertension, is very common in the [place]. Hypertension is largely due to excessive salt intake, but other important risk factors include being overweight, physical inactivity, drinking too much alcohol, smoking, and not eating enough potassium from fruits and vegetables. High blood pressure is a leading risk factor for heart attack, stroke, congestive heart failure, dementia, kidney failure, and premature death. Long-term effects of excessive salt intake include stiffening of the arteries and thickening of heart muscle and organ damage. Recommendations include ways to reduce hypertension and the risk of heart disease.  Diseases of Our Time - Focusing on Diabetes Clinical staff conducted group  or individual video education with verbal and written material and guidebook.  Patient learns why the best way to stop diseases of our time is prevention, through food and other lifestyle changes. Medicine (such as prescription pills and surgeries) is often only a Band-Aid on the problem, not a long-term solution. Most common diseases of our time include obesity, type 2 diabetes, hypertension, heart disease, and cancer. The Pritikin Program is recommended and has been proven to help reduce, reverse, and/or prevent the damaging effects of metabolic syndrome.  Nutrition   Overview of the Pritikin Eating Plan  Clinical staff conducted group or individual video education with verbal and written material and guidebook.  Patient learns about the Pritikin Eating Plan for disease risk reduction. The Pritikin Eating Plan emphasizes a wide variety of unrefined, minimally-processed carbohydrates, like fruits, vegetables, whole grains, and legumes. Go, Caution, and Stop food choices are explained. Plant-based and lean animal proteins are emphasized. Rationale provided for low sodium intake for blood pressure control, low added sugars for blood sugar stabilization, and low added fats and oils for coronary artery disease risk reduction and weight management.  Calorie Density  Clinical staff conducted group or individual video education with verbal and written material and guidebook.  Patient learns about calorie density and how it impacts the Pritikin Eating Plan. Knowing the characteristics of the food you choose will help you decide whether those foods will lead to weight gain or weight loss, and whether you want to consume more or less of them. Weight loss is usually a side effect of the Pritikin Eating Plan because of its focus on low calorie-dense foods.  Label Reading  Clinical staff conducted group or individual video education with verbal and written material and guidebook.  Patient learns about the Pritikin  recommended label reading guidelines  and corresponding recommendations regarding calorie density, added sugars, sodium content, and whole grains.  Dining Out - Part 1  Clinical staff conducted group or individual video education with verbal and written material and guidebook.  Patient learns that restaurant meals can be sabotaging because they can be so high in calories, fat, sodium, and/or sugar. Patient learns recommended strategies on how to positively address this and avoid unhealthy pitfalls.  Facts on Fats  Clinical staff conducted group or individual video education with verbal and written material and guidebook.  Patient learns that lifestyle modifications can be just as effective, if not more so, as many medications for lowering your risk of heart disease. A Pritikin lifestyle can help to reduce your risk of inflammation and atherosclerosis (cholesterol build-up, or plaque, in the artery walls). Lifestyle interventions such as dietary choices and physical activity address the cause of atherosclerosis. A review of the types of fats and their impact on blood cholesterol levels, along with dietary recommendations to reduce fat intake is also included.  Nutrition Action Plan  Clinical staff conducted group or individual video education with verbal and written material and guidebook.  Patient learns how to incorporate Pritikin recommendations into their lifestyle. Recommendations include planning and keeping personal health goals in mind as an important part of their success.  Healthy Mind-Set    Healthy Minds, Bodies, Hearts  Clinical staff conducted group or individual video education with verbal and written material and guidebook.  Patient learns how to identify when they are stressed. Video will discuss the impact of that stress, as well as the many benefits of stress management. Patient will also be introduced to stress management techniques. The way we think, act, and feel has an impact on  our hearts.  How Our Thoughts Can Heal Our Hearts  Clinical staff conducted group or individual video education with verbal and written material and guidebook.  Patient learns that negative thoughts can cause depression and anxiety. This can result in negative lifestyle behavior and serious health problems. Cognitive behavioral therapy is an effective method to help control our thoughts in order to change and improve our emotional outlook.  Additional Videos:  Exercise    Improving Performance  Clinical staff conducted group or individual video education with verbal and written material and guidebook.  Patient learns to use a non-linear approach by alternating intensity levels and lengths of time spent exercising to help burn more calories and lose more body fat. Cardiovascular exercise helps improve heart health, metabolism, hormonal balance, blood sugar control, and recovery from fatigue. Resistance training improves strength, endurance, balance, coordination, reaction time, metabolism, and muscle mass. Flexibility exercise improves circulation, posture, and balance. Seek guidance from your physician and exercise physiologist before implementing an exercise routine and learn your capabilities and proper form for all exercise.  Introduction to Yoga  Clinical staff conducted group or individual video education with verbal and written material and guidebook.  Patient learns about yoga, a discipline of the coming together of mind, breath, and body. The benefits of yoga include improved flexibility, improved range of motion, better posture and core strength, increased lung function, weight loss, and positive self-image. Yoga's heart health benefits include lowered blood pressure, healthier heart rate, decreased cholesterol and triglyceride levels, improved immune function, and reduced stress. Seek guidance from your physician and exercise physiologist before implementing an exercise routine and learn  your capabilities and proper form for all exercise.  Medical   Aging: Enhancing Your Quality of Life  Clinical staff conducted group  or individual video education with verbal and written material and guidebook.  Patient learns key strategies and recommendations to stay in good physical health and enhance quality of life, such as prevention strategies, having an advocate, securing a Health Care Proxy and Power of Attorney, and keeping a list of medications and system for tracking them. It also discusses how to avoid risk for bone loss.  Biology of Weight Control  Clinical staff conducted group or individual video education with verbal and written material and guidebook.  Patient learns that weight gain occurs because we consume more calories than we burn (eating more, moving less). Even if your body weight is normal, you may have higher ratios of fat compared to muscle mass. Too much body fat puts you at increased risk for cardiovascular disease, heart attack, stroke, type 2 diabetes, and obesity-related cancers. In addition to exercise, following the Pritikin Eating Plan can help reduce your risk.  Decoding Lab Results  Clinical staff conducted group or individual video education with verbal and written material and guidebook.  Patient learns that lab test reflects one measurement whose values change over time and are influenced by many factors, including medication, stress, sleep, exercise, food, hydration, pre-existing medical conditions, and more. It is recommended to use the knowledge from this video to become more involved with your lab results and evaluate your numbers to speak with your doctor.   Diseases of Our Time - Overview  Clinical staff conducted group or individual video education with verbal and written material and guidebook.  Patient learns that according to the CDC, 50% to 70% of chronic diseases (such as obesity, type 2 diabetes, elevated lipids, hypertension, and heart disease)  are avoidable through lifestyle improvements including healthier food choices, listening to satiety cues, and increased physical activity.  Sleep Disorders Clinical staff conducted group or individual video education with verbal and written material and guidebook.  Patient learns how good quality and duration of sleep are important to overall health and well-being. Patient also learns about sleep disorders and how they impact health along with recommendations to address them, including discussing with a physician.  Nutrition  Dining Out - Part 2 Clinical staff conducted group or individual video education with verbal and written material and guidebook.  Patient learns how to plan ahead and communicate in order to maximize their dining experience in a healthy and nutritious manner. Included are recommended food choices based on the type of restaurant the patient is visiting.   Fueling a Banker conducted group or individual video education with verbal and written material and guidebook.  There is a strong connection between our food choices and our health. Diseases like obesity and type 2 diabetes are very prevalent and are in large-part due to lifestyle choices. The Pritikin Eating Plan provides plenty of food and hunger-curbing satisfaction. It is easy to follow, affordable, and helps reduce health risks.  Menu Workshop  Clinical staff conducted group or individual video education with verbal and written material and guidebook.  Patient learns that restaurant meals can sabotage health goals because they are often packed with calories, fat, sodium, and sugar. Recommendations include strategies to plan ahead and to communicate with the manager, chef, or server to help order a healthier meal.  Planning Your Eating Strategy  Clinical staff conducted group or individual video education with verbal and written material and guidebook.  Patient learns about the Pritikin Eating Plan  and its benefit of reducing the risk of disease. The Pritikin  Eating Plan does not focus on calories. Instead, it emphasizes high-quality, nutrient-rich foods. By knowing the characteristics of the foods, we choose, we can determine their calorie density and make informed decisions.  Targeting Your Nutrition Priorities  Clinical staff conducted group or individual video education with verbal and written material and guidebook.  Patient learns that lifestyle habits have a tremendous impact on disease risk and progression. This video provides eating and physical activity recommendations based on your personal health goals, such as reducing LDL cholesterol, losing weight, preventing or controlling type 2 diabetes, and reducing high blood pressure.  Vitamins and Minerals  Clinical staff conducted group or individual video education with verbal and written material and guidebook.  Patient learns different ways to obtain key vitamins and minerals, including through a recommended healthy diet. It is important to discuss all supplements you take with your doctor.   Healthy Mind-Set    Smoking Cessation  Clinical staff conducted group or individual video education with verbal and written material and guidebook.  Patient learns that cigarette smoking and tobacco addiction pose a serious health risk which affects millions of people. Stopping smoking will significantly reduce the risk of heart disease, lung disease, and many forms of cancer. Recommended strategies for quitting are covered, including working with your doctor to develop a successful plan.  Culinary   Becoming a Set designer conducted group or individual video education with verbal and written material and guidebook.  Patient learns that cooking at home can be healthy, cost-effective, quick, and puts them in control. Keys to cooking healthy recipes will include looking at your recipe, assessing your equipment needs, planning  ahead, making it simple, choosing cost-effective seasonal ingredients, and limiting the use of added fats, salts, and sugars.  Cooking - Breakfast and Snacks  Clinical staff conducted group or individual video education with verbal and written material and guidebook.  Patient learns how important breakfast is to satiety and nutrition through the entire day. Recommendations include key foods to eat during breakfast to help stabilize blood sugar levels and to prevent overeating at meals later in the day. Planning ahead is also a key component.  Cooking - Educational psychologist conducted group or individual video education with verbal and written material and guidebook.  Patient learns eating strategies to improve overall health, including an approach to cook more at home. Recommendations include thinking of animal protein as a side on your plate rather than center stage and focusing instead on lower calorie dense options like vegetables, fruits, whole grains, and plant-based proteins, such as beans. Making sauces in large quantities to freeze for later and leaving the skin on your vegetables are also recommended to maximize your experience.  Cooking - Healthy Salads and Dressing Clinical staff conducted group or individual video education with verbal and written material and guidebook.  Patient learns that vegetables, fruits, whole grains, and legumes are the foundations of the Pritikin Eating Plan. Recommendations include how to incorporate each of these in flavorful and healthy salads, and how to create homemade salad dressings. Proper handling of ingredients is also covered. Cooking - Soups and [employer] - Soups and Desserts Clinical staff conducted group or individual video education with verbal and written material and guidebook.  Patient learns that Pritikin soups and desserts make for easy, nutritious, and delicious snacks and meal components that are low in sodium, fat, sugar,  and calorie density, while high in vitamins, minerals, and filling fiber. Recommendations include simple  and healthy ideas for soups and desserts.   Overview     The Pritikin Solution Program Overview Clinical staff conducted group or individual video education with verbal and written material and guidebook.  Patient learns that the results of the Pritikin Program have been documented in more than 100 articles published in peer-reviewed journals, and the benefits include reducing risk factors for (and, in some cases, even reversing) high cholesterol, high blood pressure, type 2 diabetes, obesity, and more! An overview of the three key pillars of the Pritikin Program will be covered: eating well, doing regular exercise, and having a healthy mind-set.  WORKSHOPS  Exercise: Exercise Basics: Building Your Action Plan Clinical staff led group instruction and group discussion with PowerPoint presentation and patient guidebook. To enhance the learning environment the use of posters, models and videos may be added. At the conclusion of this workshop, patients will comprehend the difference between physical activity and exercise, as well as the benefits of incorporating both, into their routine. Patients will understand the FITT (Frequency, Intensity, Time, and Type) principle and how to use it to build an exercise action plan. In addition, safety concerns and other considerations for exercise and cardiac rehab will be addressed by the presenter. The purpose of this lesson is to promote a comprehensive and effective weekly exercise routine in order to improve patients' overall level of fitness.   Managing Heart Disease: Your Path to a Healthier Heart Clinical staff led group instruction and group discussion with PowerPoint presentation and patient guidebook. To enhance the learning environment the use of posters, models and videos may be added.At the conclusion of this workshop, patients will understand  the anatomy and physiology of the heart. Additionally, they will understand how Pritikin's three pillars impact the risk factors, the progression, and the management of heart disease.  The purpose of this lesson is to provide a high-level overview of the heart, heart disease, and how the Pritikin lifestyle positively impacts risk factors.  Exercise Biomechanics Clinical staff led group instruction and group discussion with PowerPoint presentation and patient guidebook. To enhance the learning environment the use of posters, models and videos may be added. Patients will learn how the structural parts of their bodies function and how these functions impact their daily activities, movement, and exercise. Patients will learn how to promote a neutral spine, learn how to manage pain, and identify ways to improve their physical movement in order to promote healthy living. The purpose of this lesson is to expose patients to common physical limitations that impact physical activity. Participants will learn practical ways to adapt and manage aches and pains, and to minimize their effect on regular exercise. Patients will learn how to maintain good posture while sitting, walking, and lifting.  Balance Training and Fall Prevention  Clinical staff led group instruction and group discussion with PowerPoint presentation and patient guidebook. To enhance the learning environment the use of posters, models and videos may be added. At the conclusion of this workshop, patients will understand the importance of their sensorimotor skills (vision, proprioception, and the vestibular system) in maintaining their ability to balance as they age. Patients will apply a variety of balancing exercises that are appropriate for their current level of function. Patients will understand the common causes for poor balance, possible solutions to these problems, and ways to modify their physical environment in order to minimize  their fall risk. The purpose of this lesson is to teach patients about the importance of maintaining balance as they age and  ways to minimize their risk of falling.  WORKSHOPS   Nutrition:  Fueling a Ship broker led group instruction and group discussion with PowerPoint presentation and patient guidebook. To enhance the learning environment the use of posters, models and videos may be added. Patients will review the foundational principles of the Pritikin Eating Plan and understand what constitutes a serving size in each of the food groups. Patients will also learn Pritikin-friendly foods that are better choices when away from home and review make-ahead meal and snack options. Calorie density will be reviewed and applied to three nutrition priorities: weight maintenance, weight loss, and weight gain. The purpose of this lesson is to reinforce (in a group setting) the key concepts around what patients are recommended to eat and how to apply these guidelines when away from home by planning and selecting Pritikin-friendly options. Patients will understand how calorie density may be adjusted for different weight management goals.  Mindful Eating  Clinical staff led group instruction and group discussion with PowerPoint presentation and patient guidebook. To enhance the learning environment the use of posters, models and videos may be added. Patients will briefly review the concepts of the Pritikin Eating Plan and the importance of low-calorie dense foods. The concept of mindful eating will be introduced as well as the importance of paying attention to internal hunger signals. Triggers for non-hunger eating and techniques for dealing with triggers will be explored. The purpose of this lesson is to provide patients with the opportunity to review the basic principles of the Pritikin Eating Plan, discuss the value of eating mindfully and how to measure internal cues of hunger and fullness using the  Hunger Scale. Patients will also discuss reasons for non-hunger eating and learn strategies to use for controlling emotional eating.  Targeting Your Nutrition Priorities Clinical staff led group instruction and group discussion with PowerPoint presentation and patient guidebook. To enhance the learning environment the use of posters, models and videos may be added. Patients will learn how to determine their genetic susceptibility to disease by reviewing their family history. Patients will gain insight into the importance of diet as part of an overall healthy lifestyle in mitigating the impact of genetics and other environmental insults. The purpose of this lesson is to provide patients with the opportunity to assess their personal nutrition priorities by looking at their family history, their own health history and current risk factors. Patients will also be able to discuss ways of prioritizing and modifying the Pritikin Eating Plan for their highest risk areas  Menu  Clinical staff led group instruction and group discussion with PowerPoint presentation and patient guidebook. To enhance the learning environment the use of posters, models and videos may be added. Using menus brought in from [employer], or printed from [employer], patients will apply the Pritikin dining out guidelines that were presented in the [employer] video. Patients will also be able to practice these guidelines in a variety of provided scenarios. The purpose of this lesson is to provide patients with the opportunity to practice hands-on learning of the Pritikin Dining Out guidelines with actual menus and practice scenarios.  Label Reading Clinical staff led group instruction and group discussion with PowerPoint presentation and patient guidebook. To enhance the learning environment the use of posters, models and videos may be added. Patients will review and discuss the Pritikin label reading guidelines  presented in Pritikin's Label Reading Educational series video. Using fool labels brought in from local grocery stores and  markets, patients will apply the label reading guidelines and determine if the packaged food meet the Pritikin guidelines. The purpose of this lesson is to provide patients with the opportunity to review, discuss, and practice hands-on learning of the Pritikin Label Reading guidelines with actual packaged food labels. Cooking School  Pritikin's [employer] are designed to teach patients ways to prepare quick, simple, and affordable recipes at home. The importance of nutrition's role in chronic disease risk reduction is reflected in its emphasis in the overall Pritikin program. By learning how to prepare essential core Pritikin Eating Plan recipes, patients will increase control over what they eat; be able to customize the flavor of foods without the use of added salt, sugar, or fat; and improve the quality of the food they consume. By learning a set of core recipes which are easily assembled, quickly prepared, and affordable, patients are more likely to prepare more healthy foods at home. These workshops focus on convenient breakfasts, simple entres, side dishes, and desserts which can be prepared with minimal effort and are consistent with nutrition recommendations for cardiovascular risk reduction. Cooking [employer] are taught by a Armed forces logistics/support/administrative officer (RD) who has been trained by the [employer]. The chef or RD has a clear understanding of the importance of minimizing - if not completely eliminating - added fat, sugar, and sodium in recipes. Throughout the series of Cooking School Workshop sessions, patients will learn about healthy ingredients and efficient methods of cooking to build confidence in their capability to prepare    Cooking School weekly topics:  Adding Flavor- Sodium-Free  Fast and Healthy Breakfasts  Powerhouse Plant-Based  Proteins  Satisfying Salads and Dressings  Simple Sides and Sauces  International Cuisine-Spotlight on the [employer] Zones  Delicious Desserts  Savory Soups  [employer] - Meals in a Astronomer Appetizers and Snacks  Comforting Weekend Breakfasts  One-Pot Wonders   Fast Evening Meals  Landscape architect Your Pritikin Plate  WORKSHOPS   Healthy Mindset (Psychosocial):  Focused Goals, Sustainable Changes Clinical staff led group instruction and group discussion with PowerPoint presentation and patient guidebook. To enhance the learning environment the use of posters, models and videos may be added. Patients will be able to apply effective goal setting strategies to establish at least one personal goal, and then take consistent, meaningful action toward that goal. They will learn to identify common barriers to achieving personal goals and develop strategies to overcome them. Patients will also gain an understanding of how our mind-set can impact our ability to achieve goals and the importance of cultivating a positive and growth-oriented mind-set. The purpose of this lesson is to provide patients with a deeper understanding of how to set and achieve personal goals, as well as the tools and strategies needed to overcome common obstacles which may arise along the way.  From Head to Heart: The Power of a Healthy Outlook  Clinical staff led group instruction and group discussion with PowerPoint presentation and patient guidebook. To enhance the learning environment the use of posters, models and videos may be added. Patients will be able to recognize and describe the impact of emotions and mood on physical health. They will discover the importance of self-care and explore self-care practices which may work for them. Patients will also learn how to utilize the 4 C's to cultivate a healthier outlook and better manage stress and challenges. The purpose of this lesson is to demonstrate  to patients how a  healthy outlook is an essential part of maintaining good health, especially as they continue their cardiac rehab journey.  Healthy Sleep for a Healthy Heart Clinical staff led group instruction and group discussion with PowerPoint presentation and patient guidebook. To enhance the learning environment the use of posters, models and videos may be added. At the conclusion of this workshop, patients will be able to demonstrate knowledge of the importance of sleep to overall health, well-being, and quality of life. They will understand the symptoms of, and treatments for, common sleep disorders. Patients will also be able to identify daytime and nighttime behaviors which impact sleep, and they will be able to apply these tools to help manage sleep-related challenges. The purpose of this lesson is to provide patients with a general overview of sleep and outline the importance of quality sleep. Patients will learn about a few of the most common sleep disorders. Patients will also be introduced to the concept of "sleep hygiene," and discover ways to self-manage certain sleeping problems through simple daily behavior changes. Finally, the workshop will motivate patients by clarifying the links between quality sleep and their goals of heart-healthy living.   Recognizing and Reducing Stress Clinical staff led group instruction and group discussion with PowerPoint presentation and patient guidebook. To enhance the learning environment the use of posters, models and videos may be added. At the conclusion of this workshop, patients will be able to understand the types of stress reactions, differentiate between acute and chronic stress, and recognize the impact that chronic stress has on their health. They will also be able to apply different coping mechanisms, such as reframing negative self-talk. Patients will have the opportunity to practice a variety of stress management techniques, such as deep  abdominal breathing, progressive muscle relaxation, and/or guided imagery.  The purpose of this lesson is to educate patients on the role of stress in their lives and to provide healthy techniques for coping with it.  Learning Barriers/Preferences:  Learning Barriers/Preferences - 12/04/22 1412       Learning Barriers/Preferences   Learning Preferences Group Instruction;Individual Instruction;Skilled Demonstration;Verbal Instruction             Education Topics:  Knowledge Questionnaire Score:  Knowledge Questionnaire Score - 12/04/22 1412       Knowledge Questionnaire Score   Pre Score 22/24             Core Components/Risk Factors/Patient Goals at Admission:  Personal Goals and Risk Factors at Admission - 12/04/22 1413       Core Components/Risk Factors/Patient Goals on Admission    Weight Management Yes;Obesity;Weight Loss    Intervention Weight Management: Develop a combined nutrition and exercise program designed to reach desired caloric intake, while maintaining appropriate intake of nutrient and fiber, sodium and fats, and appropriate energy expenditure required for the weight goal.;Weight Management: Provide education and appropriate resources to help participant work on and attain dietary goals.;Weight Management/Obesity: Establish reasonable short term and long term weight goals.;Obesity: Provide education and appropriate resources to help participant work on and attain dietary goals.    Admit Weight 292 lb 15.9 oz (132.9 kg)    Expected Outcomes Short Term: Continue to assess and modify interventions until short term weight is achieved;Long Term: Adherence to nutrition and physical activity/exercise program aimed toward attainment of established weight goal;Weight Loss: Understanding of general recommendations for a balanced deficit meal plan, which promotes 1-2 lb weight loss per week and includes a negative energy balance of [phone] kcal/d;Understanding  recommendations for  meals to include 15-35% energy as protein, 25-35% energy from fat, 35-60% energy from carbohydrates, less than 200mg  of dietary cholesterol, 20-35 gm of total fiber daily;Understanding of distribution of calorie intake throughout the day with the consumption of 4-5 meals/snacks    Heart Failure Yes    Intervention Provide a combined exercise and nutrition program that is supplemented with education, support and counseling about heart failure. Directed toward relieving symptoms such as shortness of breath, decreased exercise tolerance, and extremity edema.    Expected Outcomes Improve functional capacity of life;Short term: Attendance in program 2-3 days a week with increased exercise capacity. Reported lower sodium intake. Reported increased fruit and vegetable intake. Reports medication compliance.;Short term: Daily weights obtained and reported for increase. Utilizing diuretic protocols set by physician.;Long term: Adoption of self-care skills and reduction of barriers for early signs and symptoms recognition and intervention leading to self-care maintenance.    Hypertension Yes    Intervention Monitor prescription use compliance.;Provide education on lifestyle modifcations including regular physical activity/exercise, weight management, moderate sodium restriction and increased consumption of fresh fruit, vegetables, and low fat dairy, alcohol moderation, and smoking cessation.    Expected Outcomes Short Term: Continued assessment and intervention until BP is < 140/63mm HG in hypertensive participants. < 130/33mm HG in hypertensive participants with diabetes, heart failure or chronic kidney disease.;Long Term: Maintenance of blood pressure at goal levels.    Stress Yes    Intervention Offer individual and/or small group education and counseling on adjustment to heart disease, stress management and health-related lifestyle change. Teach and support self-help strategies.;Refer  participants experiencing significant psychosocial distress to appropriate mental health specialists for further evaluation and treatment. When possible, include family members and significant others in education/counseling sessions.    Expected Outcomes Short Term: Participant demonstrates changes in health-related behavior, relaxation and other stress management skills, ability to obtain effective social support, and compliance with psychotropic medications if prescribed.;Long Term: Emotional wellbeing is indicated by absence of clinically significant psychosocial distress or social isolation.             Core Components/Risk Factors/Patient Goals Review:   Goals and Risk Factor Review     Row Name 12/10/22 0849 12/30/22 1423           Core Components/Risk Factors/Patient Goals Review   Personal Goals Review Weight Management/Obesity;Heart Failure;Hypertension;Stress Weight Management/Obesity;Heart Failure;Hypertension;Stress      Review Art did well with exercise on his first day of exercise at cardiac rehab. Vital signs were stable. CBG's were spot checked. WNL. Art is doing well with exercise  cardiac rehab. Vital signs have been  stable post ablation      Expected Outcomes Art will continue to participate in cardiac rehab for exercise, nutrition and lifestyle modifications Art will continue to participate in cardiac rehab for exercise, nutrition and lifestyle modifications               Core Components/Risk Factors/Patient Goals at Discharge (Final Review):   Goals and Risk Factor Review - 12/30/22 1423       Core Components/Risk Factors/Patient Goals Review   Personal Goals Review Weight Management/Obesity;Heart Failure;Hypertension;Stress    Review Art is doing well with exercise  cardiac rehab. Vital signs have been  stable post ablation    Expected Outcomes Art will continue to participate in cardiac rehab for exercise, nutrition and lifestyle modifications              ITP Comments:  ITP Comments     Row  Name 12/04/22 1320 12/09/22 0908 12/30/22 1416       ITP Comments Dr. Armanda Magic medical director. Introduction to pritikin education/ intensive cardiac rehab. Initital orientation packet reviewed with patient. 30 Day ITP Review. Natthew started cardiac rehab on 12/08/22. Art did well with exercise. 30 Day ITP Review. Kennie returned to exercise at  cardiac rehab post ablation per Christy Sartorius NP.              Comments: See ITP comments.

## 2022-12-31 ENCOUNTER — Encounter (HOSPITAL_COMMUNITY)
Admission: RE | Admit: 2022-12-31 | Discharge: 2022-12-31 | Disposition: A | Discharge status: Home / Self Care | Payer: BC Managed Care – PPO | Source: Ambulatory Visit | Attending: Internal Medicine

## 2022-12-31 DIAGNOSIS — Z5189 Encounter for other specified aftercare: Secondary | ICD-10-CM | POA: Diagnosis not present

## 2022-12-31 DIAGNOSIS — I5022 Chronic systolic (congestive) heart failure: Secondary | ICD-10-CM | POA: Diagnosis not present

## 2023-01-01 ENCOUNTER — Other Ambulatory Visit (HOSPITAL_COMMUNITY): Payer: Self-pay

## 2023-01-02 ENCOUNTER — Encounter (HOSPITAL_COMMUNITY)
Admission: RE | Admit: 2023-01-02 | Discharge: 2023-01-02 | Disposition: A | Discharge status: Home / Self Care | Payer: BC Managed Care – PPO | Source: Ambulatory Visit | Attending: Internal Medicine

## 2023-01-02 DIAGNOSIS — Z5189 Encounter for other specified aftercare: Secondary | ICD-10-CM | POA: Diagnosis not present

## 2023-01-02 DIAGNOSIS — I5022 Chronic systolic (congestive) heart failure: Secondary | ICD-10-CM | POA: Diagnosis not present

## 2023-01-05 ENCOUNTER — Encounter (HOSPITAL_COMMUNITY)
Admission: RE | Admit: 2023-01-05 | Discharge: 2023-01-05 | Disposition: A | Discharge status: Home / Self Care | Payer: BC Managed Care – PPO | Source: Ambulatory Visit | Attending: Internal Medicine

## 2023-01-05 ENCOUNTER — Other Ambulatory Visit (HOSPITAL_COMMUNITY): Payer: Self-pay

## 2023-01-05 DIAGNOSIS — I5022 Chronic systolic (congestive) heart failure: Secondary | ICD-10-CM

## 2023-01-05 DIAGNOSIS — Z5189 Encounter for other specified aftercare: Secondary | ICD-10-CM | POA: Diagnosis not present

## 2023-01-07 ENCOUNTER — Encounter (HOSPITAL_COMMUNITY)
Admission: RE | Admit: 2023-01-07 | Discharge: 2023-01-07 | Disposition: A | Discharge status: Home / Self Care | Payer: BC Managed Care – PPO | Source: Ambulatory Visit | Attending: Internal Medicine

## 2023-01-07 DIAGNOSIS — I5022 Chronic systolic (congestive) heart failure: Secondary | ICD-10-CM | POA: Diagnosis not present

## 2023-01-07 DIAGNOSIS — Z5189 Encounter for other specified aftercare: Secondary | ICD-10-CM | POA: Diagnosis not present

## 2023-01-09 ENCOUNTER — Encounter (HOSPITAL_COMMUNITY)
Admission: RE | Admit: 2023-01-09 | Discharge: 2023-01-09 | Disposition: A | Discharge status: Home / Self Care | Payer: BC Managed Care – PPO | Source: Ambulatory Visit | Attending: Internal Medicine | Admitting: Internal Medicine

## 2023-01-09 DIAGNOSIS — I5022 Chronic systolic (congestive) heart failure: Secondary | ICD-10-CM | POA: Insufficient documentation

## 2023-01-12 ENCOUNTER — Encounter (HOSPITAL_COMMUNITY)
Admission: RE | Admit: 2023-01-12 | Discharge: 2023-01-12 | Disposition: A | Discharge status: Home / Self Care | Payer: BC Managed Care – PPO | Source: Ambulatory Visit | Attending: Internal Medicine

## 2023-01-12 DIAGNOSIS — I5022 Chronic systolic (congestive) heart failure: Secondary | ICD-10-CM

## 2023-01-13 ENCOUNTER — Encounter (HOSPITAL_COMMUNITY): Payer: Self-pay

## 2023-01-13 ENCOUNTER — Ambulatory Visit (HOSPITAL_COMMUNITY)
Admission: RE | Admit: 2023-01-13 | Discharge: 2023-01-13 | Disposition: A | Discharge status: Home / Self Care | Payer: BC Managed Care – PPO | Source: Ambulatory Visit | Attending: Family Medicine | Admitting: Family Medicine

## 2023-01-13 VITALS — BP 132/84 | HR 84 | Ht 77.0 in | Wt 301.2 lb

## 2023-01-13 DIAGNOSIS — G4733 Obstructive sleep apnea (adult) (pediatric): Secondary | ICD-10-CM | POA: Insufficient documentation

## 2023-01-13 DIAGNOSIS — Z6835 Body mass index (BMI) 35.0-35.9, adult: Secondary | ICD-10-CM | POA: Insufficient documentation

## 2023-01-13 DIAGNOSIS — N1832 Chronic kidney disease, stage 3b: Secondary | ICD-10-CM | POA: Diagnosis not present

## 2023-01-13 DIAGNOSIS — Z7901 Long term (current) use of anticoagulants: Secondary | ICD-10-CM | POA: Insufficient documentation

## 2023-01-13 DIAGNOSIS — I459 Conduction disorder, unspecified: Secondary | ICD-10-CM | POA: Insufficient documentation

## 2023-01-13 DIAGNOSIS — I480 Paroxysmal atrial fibrillation: Secondary | ICD-10-CM | POA: Insufficient documentation

## 2023-01-13 DIAGNOSIS — Z7984 Long term (current) use of oral hypoglycemic drugs: Secondary | ICD-10-CM | POA: Insufficient documentation

## 2023-01-13 DIAGNOSIS — I130 Hypertensive heart and chronic kidney disease with heart failure and stage 1 through stage 4 chronic kidney disease, or unspecified chronic kidney disease: Secondary | ICD-10-CM | POA: Insufficient documentation

## 2023-01-13 DIAGNOSIS — Z79899 Other long term (current) drug therapy: Secondary | ICD-10-CM | POA: Diagnosis not present

## 2023-01-13 DIAGNOSIS — I5022 Chronic systolic (congestive) heart failure: Secondary | ICD-10-CM | POA: Insufficient documentation

## 2023-01-13 DIAGNOSIS — I4892 Unspecified atrial flutter: Secondary | ICD-10-CM

## 2023-01-13 DIAGNOSIS — I428 Other cardiomyopathies: Secondary | ICD-10-CM | POA: Diagnosis not present

## 2023-01-13 DIAGNOSIS — E669 Obesity, unspecified: Secondary | ICD-10-CM

## 2023-01-13 DIAGNOSIS — E1122 Type 2 diabetes mellitus with diabetic chronic kidney disease: Secondary | ICD-10-CM | POA: Insufficient documentation

## 2023-01-13 LAB — BRAIN NATRIURETIC PEPTIDE: B Natriuretic Peptide: 70 pg/mL (ref 0.0–100.0)

## 2023-01-13 LAB — BASIC METABOLIC PANEL
Anion gap: 9 (ref 5–15)
BUN: 21 mg/dL — ABNORMAL HIGH (ref 6–20)
CO2: 28 mmol/L (ref 22–32)
Calcium: 9.2 mg/dL (ref 8.9–10.3)
Chloride: 101 mmol/L (ref 98–111)
Creatinine, Ser: 1.44 mg/dL — ABNORMAL HIGH (ref 0.61–1.24)
GFR, Estimated: 56 mL/min — ABNORMAL LOW (ref >60)
Glucose, Bld: 103 mg/dL — ABNORMAL HIGH (ref 70–99)
Potassium: 4.1 mmol/L (ref 3.5–5.1)
Sodium: 138 mmol/L (ref 135–145)

## 2023-01-13 MED ORDER — AMIODARONE HCL 200 MG PO TABS: 200.0000 mg | ORAL_TABLET | Freq: Two times a day (BID) | ORAL | 6 refills | Status: DC

## 2023-01-13 NOTE — Progress Notes (Signed)
ReDS Vest / Clip - 01/13/23 1038       ReDS Vest / Clip   Station Marker D    Ruler Value 41.5    ReDS Value Range Low volume    ReDS Actual Value 23

## 2023-01-13 NOTE — Progress Notes (Signed)
Advanced [facility] Failure Clinic Note  PCP: Kristian Covey, MD HF Cardiologist: Dr. Gala Romney  HPI: 57 y.o. 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   Echo 2011: EF 30-35%. Cath 2011 with normal cors.   Echo 10/17: EF 35-40%.   We saw him for the first time in May 2017.    Underwent DC-CV on 06/19/17 for recurrent AF Seen in AF Clinic on 07/02/17. Felt not to be ideal candidate for AF ablation   Echo 12/21 EF 40-45%   CT angio in 2019 aortic root was 4.6 cm   CT chest 03/06/20 to evaluate hilar fullness seen on CXR. Interval development of numerous enlarged mediastinal and bilateral hilar lymph nodes. Underwent bronchoscopy with biopsy of mediastinal nodes. 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 Treated with prednisone.    CT coronary 03/06/20. Calcium score 0. Poor arterial imaging but no obvious CAD   Follow up 7/24, back in AFL. Diuretics increased, sp DCCV to NSR 7/225/24. Follow up in AF clinic, back in AFL with RVR. Ranexa stopped in anticipation of Tikosyn loading.    Admitted 8/24 with AF with RVR. He was sedated and underwent cardioversion in the ED. Subsequently developed shock. Bedside POCUS with EF 15%. Echo with EF 25-30%, RV moderately down. Had recurrent Afib with RVR and PVCs. DCCV attempted X 2 on 10/19/22. Amiodarone load resumed and had repeat DCCV on 10/23/22 with conversion to SR.   Readmitted 10/27/22 with recurrent AFL with RVR and acute on chronic CHF with low-output. Converted to SR with IV amiodarone. He required inotrope support with milrinone. RHC on milrinone with elevated filling pressures and low-output Fick CI 2.1 and TD CI 1.7. He was diuresed with IV lasix and metolazone. Course further complicated by AKI on CKD and PNA, for which he was treated with antibiotics.   Recurrent atrial flutter, and underwent DCCV to SR on 11/13/22.   S/p AFL ablation 10/24.  Today he returns for HF follow up.  Overall feeling fine. Energy a little low, recently re-started tirzepatide. He is not SOB with activity, doing well with cardiac rehab. Denies palpitations, abnormal bleeding, CP, dizziness, edema, or PND/Orthopnea. Appetite ok. No fever or chills. Weight at home 295  pounds. Taking all medications. Of note, he has been taking amio 400 bid since 11/09/22.    ROS: All systems negative except as listed in HPI, PMH and Problem List.  SH:  Social History   Socioeconomic History   Marital status: Married    Spouse name: Not on file   Number of children: 3   Years of education: Not on file   Highest education level: Not on file  Occupational History   Occupation: realtor  Tobacco Use   Smoking status: Never   Smokeless tobacco: Never  Vaping Use   Vaping status: Never Used  Substance and Sexual Activity   Alcohol use: Yes    Comment: occasional   Drug use: Never   Sexual activity: Not on file  Other Topics Concern   Not on file  Social History Narrative   Not on file   Social Determinants of Health   Financial Resource Strain: Not on file  Food Insecurity: No Food Insecurity (11/11/2022)   Hunger Vital Sign    Worried About Running Out of Food in the Last Year: Never true    Ran Out of Food in the Last Year: Never true  Transportation Needs: No Transportation Needs (11/11/2022)  PRAPARE - Administrator, Civil Service (Medical): No    Lack of Transportation (Non-Medical): No  Physical Activity: Not on file  Stress: Not on file  Social Connections: Not on file  Intimate Partner Violence: Not At Risk (11/05/2022)   Humiliation, Afraid, Rape, and Kick questionnaire    Fear of Current or Ex-Partner: No    Emotionally Abused: No    Physically Abused: No    Sexually Abused: No   FH:  Family History  Problem Relation Age of Onset   Breast cancer Mother    Cancer Father        blood cancer   Hypertension Other    Heart disease Other    Stomach cancer Neg Hx     Colon cancer Neg Hx    Esophageal cancer Neg Hx    Pancreatic cancer Neg Hx    Rectal cancer Neg Hx     Past Medical History:  Diagnosis Date   Chronic bronchitis    HTN (hypertension)    NICM (nonischemic cardiomyopathy) (HCC)    Obesity (BMI 30-39.9) 12/04/2015   OSA (obstructive sleep apnea) 08/19/2015   Severe with AHI 64/hr now on CPAP at 13cm H2O   PAF (paroxysmal atrial fibrillation) (HCC)    Systolic CHF, chronic (HCC)     Current Outpatient Medications  Medication Sig Dispense Refill   albuterol (VENTOLIN HFA) 108 (90 Base) MCG/ACT inhaler TAKE 2 PUFFS BY MOUTH EVERY 6 HOURS AS NEEDED FOR WHEEZE OR SHORTNESS OF BREATH 8.5 each 2   amiodarone (PACERONE) 200 MG tablet Take 2 tablets (400 mg total) by mouth 2 (two) times daily. 120 tablet 3   digoxin (LANOXIN) 0.125 MG tablet Take 1 tablet (0.125 mg total) by mouth daily. 30 tablet 5   DULoxetine (CYMBALTA) 20 MG capsule TAKE 1 CAPSULE BY MOUTH EVERY DAY 90 capsule 3   ELIQUIS 5 MG TABS tablet TAKE 1 TABLET BY MOUTH TWICE A DAY 60 tablet 6   empagliflozin (JARDIANCE) 10 MG TABS tablet Take 1 tablet (10 mg total) by mouth daily. 30 tablet 5   Magnesium 250 MG TABS Take 250 mg by mouth every evening. Gummy     ondansetron (ZOFRAN) 4 MG tablet Take 1 tablet (4 mg total) by mouth every 8 (eight) hours as needed for nausea or vomiting. 60 tablet 1   potassium chloride SA (KLOR-CON M) 20 MEQ tablet Take 2 tablets (40 mEq total) by mouth 2 (two) times daily. 360 tablet 3   spironolactone (ALDACTONE) 25 MG tablet Take 1 tablet (25 mg total) by mouth daily. 30 tablet 5   tirzepatide (ZEPBOUND) 2.5 MG/0.5ML Pen Inject 2.5 mg into the skin once a week. 2 mL 0   torsemide (DEMADEX) 20 MG tablet Patient takes 2 tablets by mouth twice a day.     No current facility-administered medications for this encounter.    Wt Readings from Last 3 Encounters:  01/13/23 (!) 136.6 kg (301 lb 3.2 oz)  12/17/22 131.5 kg (290 lb)  12/16/22 134.6 kg (296  lb 12.8 oz)   BP 132/84   Pulse 84   Ht 6\' 5"  (1.956 m)   Wt (!) 136.6 kg (301 lb 3.2 oz)   SpO2 95%   BMI 35.72 kg/m   PHYSICAL EXAM: General:  NAD. No resp difficulty, walked into clinic HEENT: Normal Neck: Supple. No JVD. Carotids 2+ bilat; no bruits. No lymphadenopathy or thryomegaly appreciated. Cor: PMI nondisplaced. Regular rate & rhythm. No rubs, gallops or  murmurs. Lungs: Clear Abdomen: Soft, obese, nontender, nondistended. No hepatosplenomegaly. No bruits or masses. Good bowel sounds. Extremities: No cyanosis, clubbing, rash, edema Neuro: Alert & oriented x 3, cranial nerves grossly intact. Moves all 4 extremities w/o difficulty. Affect pleasant.  ReDs: 23%  ECG (personally reviewed): NSR 1AVB  ASSESSMENT & PLAN:  1. Chronic Systolic Heart Failure  - Echo 2011: EF 30-35%. Cath 2011 with normal cors. - Echo 10/17: EF 35-40%. - CMRI 2017 LVEF 20%. No LGE - Echo 12/21 EF 40-45%. Coronary CTA normal cors, calcium score 0   - Cardiac PET 2023- no evidence for cardiac sarcoid or inflammatory. LVEF 38%.   - Admit 10/14/22, developed cardiogenic shock post DCCV. Echo EF 20-25% and RV moderately reduced - Readmitted 10/27/22 w/ ADHF w/ low-output in setting of recurrent AFL w/ RVR. Required inotrope support with milrinone - RHC on 10/30/22 with: RA 21, PA 56/30 (42), PCWP 30 v waves to 55, Fick CO/CI 5.9/2.1, TD CO/CI 4.6/1.7. PAPi 1.2 - NYHA I, volume OK today. ReDs 23% - Continue torsemide 40 mg bid + 40 KCL bid - Continue spiro 25 mg daily - Continue Jardiance 10 mg daily - Continue digoxin 0.125 mg daily, needs dig trough soon (will ask AF clinic if this can be drawn at his follow up 01/20/23) - No beta blocker with recent low-output. Consider adding back next. - Repeat echo has been arranged by EP for 02/26/23 - Labs today.   2. Recurrent AFL and Afib with RVR - Multiple admissions with RVR leading to decompensated HF w/ low-output. - s/p AFL ablation 10/24 - NSR on  ECG today. - has been on amio 400 bid since 11/09/22-->decrease to 200 mg bid. Hopefully can safely stop in a couple months, post ablation. - Continue Eliquis. No bleeding issues. - Recent amio labs ok   3. CKD IIIb - Baseline SCr ~ 1.2,  - Continue Jardiance - BMET today.   4. OSA  - Continue CPAP nightly. - No change    5. Type 2 DM - A1c 6.4%.  - Continue Jardiance  6. Obesity - Body mass index is 35.72 kg/m. - Now on tirzepatide.  Follow up in 3 months with Dr. Gala Romney.  Prince Rome, FNP-BC 01/13/23

## 2023-01-13 NOTE — Patient Instructions (Addendum)
Thank you for coming in today  If you had labs drawn today, any labs that are abnormal the clinic will call you No news is good news  Medications: Decrease Amiodarone to 200 mg twice daily    On your 01/20/2023 appointment please hold your digoxin that morning for lab draw   Follow up appointments:  Your physician recommends that you schedule a follow-up appointment in:  3 months With Dr. Gala Romney You will receive a reminder letter in the mail a few months in advance. If you don't receive a letter, please call our office to schedule the follow-up appointment.    Do the following things EVERYDAY: Weigh yourself in the morning before breakfast. Write it down and keep it in a log. Take your medicines as prescribed Eat low salt foods--Limit salt (sodium) to 2000 mg per day.  Stay as active as you can everyday Limit all fluids for the day to less than 2 liters   At the [facility], you and your health needs are our priority. As part of our continuing mission to provide you with exceptional heart care, we have created designated Provider Care Teams. These Care Teams include your primary Cardiologist (physician) and Advanced Practice Providers (APPs- Physician Assistants and Nurse Practitioners) who all work together to provide you with the care you need, when you need it.   You may see any of the following providers on your designated Care Team at your next follow up: Dr Arvilla Meres Dr Marca Ancona Dr. Marcos Eke, NP Robbie Lis, [place] [facility] [place], [place] Brynda Peon, NP Karle Plumber, PharmD   Please be sure to bring in all your medications bottles to every appointment.    Thank you for choosing [facility]  If you have any questions or concerns before your next appointment please send [place] a message through [place] or call our office at [phone].    TO LEAVE A MESSAGE FOR THE  NURSE SELECT OPTION 2, PLEASE LEAVE A MESSAGE INCLUDING: YOUR NAME DATE OF BIRTH CALL BACK NUMBER REASON FOR CALL**this is important as we prioritize the call backs  YOU WILL RECEIVE A CALL BACK THE SAME DAY AS LONG AS YOU CALL BEFORE 4:00 PM

## 2023-01-14 ENCOUNTER — Ambulatory Visit (HOSPITAL_COMMUNITY): Payer: BC Managed Care – PPO | Admitting: Internal Medicine

## 2023-01-14 ENCOUNTER — Encounter (HOSPITAL_COMMUNITY)
Admission: RE | Admit: 2023-01-14 | Discharge: 2023-01-14 | Disposition: A | Discharge status: Home / Self Care | Payer: BC Managed Care – PPO | Source: Ambulatory Visit | Attending: Internal Medicine

## 2023-01-14 DIAGNOSIS — I5022 Chronic systolic (congestive) heart failure: Secondary | ICD-10-CM | POA: Diagnosis not present

## 2023-01-15 ENCOUNTER — Encounter: Payer: Self-pay | Admitting: Gastroenterology

## 2023-01-16 ENCOUNTER — Encounter (HOSPITAL_COMMUNITY)
Admission: RE | Admit: 2023-01-16 | Discharge: 2023-01-16 | Disposition: A | Discharge status: Home / Self Care | Payer: BC Managed Care – PPO | Source: Ambulatory Visit | Attending: Internal Medicine | Admitting: Internal Medicine

## 2023-01-16 DIAGNOSIS — I5022 Chronic systolic (congestive) heart failure: Secondary | ICD-10-CM

## 2023-01-19 ENCOUNTER — Encounter (HOSPITAL_COMMUNITY)
Admission: RE | Admit: 2023-01-19 | Discharge: 2023-01-19 | Disposition: A | Discharge status: Home / Self Care | Payer: BC Managed Care – PPO | Source: Ambulatory Visit | Attending: Internal Medicine

## 2023-01-19 DIAGNOSIS — I5022 Chronic systolic (congestive) heart failure: Secondary | ICD-10-CM | POA: Diagnosis not present

## 2023-01-20 ENCOUNTER — Ambulatory Visit (HOSPITAL_COMMUNITY)
Admission: RE | Admit: 2023-01-20 | Discharge: 2023-01-20 | Disposition: A | Discharge status: Home / Self Care | Payer: BC Managed Care – PPO | Source: Ambulatory Visit | Attending: Internal Medicine | Admitting: Internal Medicine

## 2023-01-20 VITALS — BP 110/76 | HR 75 | Ht 77.0 in | Wt 301.2 lb

## 2023-01-20 DIAGNOSIS — D6869 Other thrombophilia: Secondary | ICD-10-CM | POA: Diagnosis not present

## 2023-01-20 DIAGNOSIS — I4892 Unspecified atrial flutter: Secondary | ICD-10-CM | POA: Insufficient documentation

## 2023-01-20 DIAGNOSIS — I4891 Unspecified atrial fibrillation: Secondary | ICD-10-CM | POA: Diagnosis not present

## 2023-01-20 DIAGNOSIS — Z79899 Other long term (current) drug therapy: Secondary | ICD-10-CM | POA: Insufficient documentation

## 2023-01-20 DIAGNOSIS — I4819 Other persistent atrial fibrillation: Secondary | ICD-10-CM | POA: Insufficient documentation

## 2023-01-20 DIAGNOSIS — Z7901 Long term (current) use of anticoagulants: Secondary | ICD-10-CM | POA: Diagnosis not present

## 2023-01-20 MED ORDER — AMIODARONE HCL 200 MG PO TABS: 200.0000 mg | ORAL_TABLET | Freq: Every day | ORAL | 3 refills | Status: DC

## 2023-01-20 NOTE — Progress Notes (Signed)
Primary Care Physician: Kristian Covey, MD Primary Cardiologist: Armanda Magic, MD Electrophysiologist: Maurice Small, MD     Referring Physician: Dr. Rolly Salter Harold Park is a 57 y.o. male with a history of NICM with EF 30-35%, OSA on CPAP, chronic systolic CHF, paroxysmal atrial flutter with RVR (01/2010), HTN, pulmonary hypertension, and atrial fibrillation who presents for consultation in the [facility]. He was seen by HF on 7/24 and noted to be in atrial flutter with RVR. He underwent successful DCCV on 10/02/22 but he contacted office 8/1 due to having ERAF. 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 Patient is on Eliquis 5 mg BID for a CHADS2VASC score of 2.  On evaluation today, he is currently in atrial flutter with RVR. He feels a little tired and short winded when walking. He has not missed any doses of Eliquis. He states previously amiodarone would make him vomit within 20 minutes of taking medication.  On follow up 01/20/23, he is currently in NSR. S/p Afib ablation on 12/17/22 by Dr. Nelly Park. No episodes of Afib since ablation. He is currently taking amiodarone 200 mg BID. It was lowered from 400 mg BID at HF visit on 11/5. He notices more energy since ablation. No chest pain, SOB, or trouble swallowing. Leg sites healed without issue. No missed doses of anticoagulant.  Today, he denies symptoms of orthopnea, PND, lower extremity edema, dizziness, presyncope, syncope, snoring, daytime somnolence, bleeding, or neurologic sequela. The patient is tolerating medications without difficulties and is otherwise without complaint today.    Atrial Fibrillation Risk Factors:  he does have symptoms or diagnosis of sleep apnea. he is compliant with CPAP therapy.  he has a BMI of Body mass index is 35.72 kg/m.Marland Kitchen Filed Weights   01/20/23 1456  Weight: (!) 136.6 kg     Current Outpatient Medications  Medication Sig  Dispense Refill   albuterol (VENTOLIN HFA) 108 (90 Base) MCG/ACT inhaler TAKE 2 PUFFS BY MOUTH EVERY 6 HOURS AS NEEDED FOR WHEEZE OR SHORTNESS OF BREATH 8.5 each 2   amiodarone (PACERONE) 200 MG tablet Take 1 tablet (200 mg total) by mouth 2 (two) times daily. 60 tablet 6   digoxin (LANOXIN) 0.125 MG tablet Take 1 tablet (0.125 mg total) by mouth daily. 30 tablet 5   DULoxetine (CYMBALTA) 20 MG capsule TAKE 1 CAPSULE BY MOUTH EVERY DAY 90 capsule 3   ELIQUIS 5 MG TABS tablet TAKE 1 TABLET BY MOUTH TWICE A DAY 60 tablet 6   empagliflozin (JARDIANCE) 10 MG TABS tablet Take 1 tablet (10 mg total) by mouth daily. 30 tablet 5   Magnesium 250 MG TABS Take 250 mg by mouth every evening. Gummy     ondansetron (ZOFRAN) 4 MG tablet Take 1 tablet (4 mg total) by mouth every 8 (eight) hours as needed for nausea or vomiting. 60 tablet 1   potassium chloride SA (KLOR-CON M) 20 MEQ tablet Take 2 tablets (40 mEq total) by mouth 2 (two) times daily. 360 tablet 3   spironolactone (ALDACTONE) 25 MG tablet Take 1 tablet (25 mg total) by mouth daily. 30 tablet 5   tirzepatide (ZEPBOUND) 2.5 MG/0.5ML Pen Inject 2.5 mg into the skin once a week. 2 mL 0   torsemide (DEMADEX) 20 MG tablet Patient takes 2 tablets by mouth twice a day.     No current facility-administered medications for this encounter.    Atrial Fibrillation Management history:  Previous antiarrhythmic  drugs: amiodarone  Previous cardioversions: 06/19/2017, 10/02/22, 10/20/22, 10/23/22, 11/13/22 Previous ablations: 12/17/22 Anticoagulation history: Eliquis 5 mg BID  ROS- All systems are reviewed and negative except as per the HPI above.  Physical Exam: BP 110/76   Pulse 75   Ht 6\' 5"  (1.956 m)   Wt (!) 136.6 kg   BMI 35.72 kg/m   GEN- The patient is well appearing, alert and oriented x 3 today.   Neck - no JVD or carotid bruit noted Lungs- Clear to ausculation bilaterally, normal work of breathing Heart- Regular rate and rhythm, no murmurs, rubs  or gallops, PMI not laterally displaced Extremities- no clubbing, cyanosis, or edema Skin - no rash or ecchymosis noted   EKG today demonstrates  Vent. rate 75 BPM PR interval 246 ms QRS duration 148 ms QT/QTcB 412/460 ms P-R-T axes 36 256 71 Sinus rhythm with 1st degree A-V block Right superior axis deviation Non-specific intra-ventricular conduction block Minimal voltage criteria for LVH, may be normal variant ( Cornell product ) Abnormal ECG When compared with ECG of 13-Jan-2023 11:30, PREVIOUS ECG IS PRESENT  Echo 10/28/22: 1. Left ventricular ejection fraction, by estimation, is 25 to 30%. The  left ventricle has severely decreased function. The left ventricle  demonstrates global hypokinesis. The left ventricular internal cavity size  was severely dilated. Left ventricular  diastolic function could not be evaluated.   2. Right ventricular systolic function is moderately reduced. The right  ventricular size is normal. Tricuspid regurgitation signal is inadequate  for assessing PA pressure.   3. Left atrial size was severely dilated.   4. Right atrial size was mild to moderately dilated.   5. The mitral valve is degenerative. Mild mitral valve regurgitation. No  evidence of mitral stenosis.   6. The aortic valve is normal in structure. Aortic valve regurgitation is  not visualized. No aortic stenosis is present.   7. The inferior vena cava is dilated in size with <50% respiratory  variability, suggesting right atrial pressure of 15 mmHg.    ASSESSMENT & PLAN CHA2DS2-VASc Score = 2  The patient's score is based upon: CHF History: 1 HTN History: 1 Diabetes History: 0 Stroke History: 0 Vascular Disease History: 0 Age Score: 0 Gender Score: 0       ASSESSMENT AND PLAN: Persistent Atrial Fibrillation (ICD10:  I48.0) / Atrial flutter The patient's CHA2DS2-VASc score is 2, indicating a 2.2% annual risk of stroke.   S/p Afib ablation on 12/17/22 by Dr. Nelly Park.  He is  currently in NSR.  After discussion with EP, will go ahead and reduce amiodarone to 200 mg once daily.   Secondary Hypercoagulable State (ICD10:  D68.69) The patient is at significant risk for stroke/thromboembolism based upon his CHA2DS2-VASc Score of 2.  Continue Apixaban (Eliquis).   No missed doses.    Follow up as scheduled with Dr. Nelly Park.   Harold Bells, PA-C  [facility] [address] [place], [place] [address] [phone]

## 2023-01-21 ENCOUNTER — Encounter (HOSPITAL_COMMUNITY)
Admission: RE | Admit: 2023-01-21 | Discharge: 2023-01-21 | Disposition: A | Discharge status: Home / Self Care | Payer: BC Managed Care – PPO | Source: Ambulatory Visit | Attending: Internal Medicine | Admitting: Internal Medicine

## 2023-01-21 DIAGNOSIS — I5022 Chronic systolic (congestive) heart failure: Secondary | ICD-10-CM | POA: Diagnosis not present

## 2023-01-23 ENCOUNTER — Encounter (HOSPITAL_BASED_OUTPATIENT_CLINIC_OR_DEPARTMENT_OTHER): Payer: Self-pay | Admitting: Cardiology

## 2023-01-23 ENCOUNTER — Encounter (HOSPITAL_COMMUNITY)
Admission: RE | Admit: 2023-01-23 | Discharge: 2023-01-23 | Disposition: A | Discharge status: Home / Self Care | Payer: BC Managed Care – PPO | Source: Ambulatory Visit | Attending: Internal Medicine | Admitting: Internal Medicine

## 2023-01-23 ENCOUNTER — Other Ambulatory Visit: Payer: Self-pay

## 2023-01-23 ENCOUNTER — Other Ambulatory Visit (HOSPITAL_COMMUNITY): Payer: Self-pay

## 2023-01-23 ENCOUNTER — Encounter (HOSPITAL_COMMUNITY): Payer: Self-pay

## 2023-01-23 ENCOUNTER — Other Ambulatory Visit: Payer: Self-pay | Admitting: Internal Medicine

## 2023-01-23 DIAGNOSIS — I5022 Chronic systolic (congestive) heart failure: Secondary | ICD-10-CM | POA: Diagnosis not present

## 2023-01-23 DIAGNOSIS — E669 Obesity, unspecified: Secondary | ICD-10-CM

## 2023-01-23 MED ORDER — ZEPBOUND 5 MG/0.5ML ~~LOC~~ SOAJ
5.0000 mg | SUBCUTANEOUS | 0 refills | Status: DC
  Filled 2023-01-23: qty 2, 28d supply, fill #0

## 2023-01-23 MED ORDER — ZEPBOUND 7.5 MG/0.5ML ~~LOC~~ SOAJ
7.5000 mg | SUBCUTANEOUS | 0 refills | Status: DC
  Filled 2023-01-23 – 2023-01-28 (×2): qty 2, 28d supply, fill #0

## 2023-01-26 ENCOUNTER — Other Ambulatory Visit: Payer: Self-pay

## 2023-01-26 ENCOUNTER — Encounter (HOSPITAL_COMMUNITY)
Admission: RE | Admit: 2023-01-26 | Discharge: 2023-01-26 | Disposition: A | Discharge status: Home / Self Care | Payer: BC Managed Care – PPO | Source: Ambulatory Visit | Attending: Internal Medicine | Admitting: Internal Medicine

## 2023-01-26 ENCOUNTER — Other Ambulatory Visit (HOSPITAL_COMMUNITY): Payer: Self-pay

## 2023-01-26 DIAGNOSIS — I5022 Chronic systolic (congestive) heart failure: Secondary | ICD-10-CM

## 2023-01-27 NOTE — Progress Notes (Signed)
Cardiac Individual Treatment Plan  Patient Details  Name: Harold Park MRN: [id number] Date of Birth: 01-04-1966 Referring Provider:   Flowsheet Row INTENSIVE CARDIAC REHAB ORIENT from 12/04/2022 in [facility]  Referring Provider Arvilla Meres, MD       Initial Encounter Date:  Flowsheet Row INTENSIVE CARDIAC REHAB ORIENT from 12/04/2022 in [facility] for [facility], [facility], [facility]  Date 12/04/22       Visit Diagnosis: Heart failure, chronic systolic (HCC)  Patient's Home Medications on Admission:  Current Outpatient Medications:    albuterol (VENTOLIN HFA) 108 (90 Base) MCG/ACT inhaler, TAKE 2 PUFFS BY MOUTH EVERY 6 HOURS AS NEEDED FOR WHEEZE OR SHORTNESS OF BREATH, Disp: 8.5 each, Rfl: 2   amiodarone (PACERONE) 200 MG tablet, Take 1 tablet (200 mg total) by mouth daily., Disp: 30 tablet, Rfl: 3   digoxin (LANOXIN) 0.125 MG tablet, Take 1 tablet (0.125 mg total) by mouth daily., Disp: 30 tablet, Rfl: 5   DULoxetine (CYMBALTA) 20 MG capsule, TAKE 1 CAPSULE BY MOUTH EVERY DAY, Disp: 90 capsule, Rfl: 3   ELIQUIS 5 MG TABS tablet, TAKE 1 TABLET BY MOUTH TWICE A DAY, Disp: 60 tablet, Rfl: 6   empagliflozin (JARDIANCE) 10 MG TABS tablet, Take 1 tablet (10 mg total) by mouth daily., Disp: 30 tablet, Rfl: 5   Magnesium 250 MG TABS, Take 250 mg by mouth every evening. Gummy, Disp: , Rfl:    ondansetron (ZOFRAN) 4 MG tablet, Take 1 tablet (4 mg total) by mouth every 8 (eight) hours as needed for nausea or vomiting., Disp: 60 tablet, Rfl: 1   potassium chloride SA (KLOR-CON M) 20 MEQ tablet, Take 2 tablets (40 mEq total) by mouth 2 (two) times daily., Disp: 360 tablet, Rfl: 3   spironolactone (ALDACTONE) 25 MG tablet, Take 1 tablet (25 mg total) by mouth daily., Disp: 30 tablet, Rfl: 5   tirzepatide (ZEPBOUND) 7.5 MG/0.5ML Pen, Inject 7.5 mg into the skin once a week., Disp: 2 mL, Rfl: 0   torsemide  (DEMADEX) 20 MG tablet, Patient takes 2 tablets by mouth twice a day., Disp: , Rfl:   Past Medical History: Past Medical History:  Diagnosis Date   Chronic bronchitis    HTN (hypertension)    NICM (nonischemic cardiomyopathy) (HCC)    Obesity (BMI 30-39.9) 12/04/2015   OSA (obstructive sleep apnea) 08/19/2015   Severe with AHI 64/hr now on CPAP at 13cm H2O   PAF (paroxysmal atrial fibrillation) (HCC)    Systolic CHF, chronic (HCC)     Tobacco Use: Social History   Tobacco Use  Smoking Status Never  Smokeless Tobacco Never    Labs: Review Flowsheet  More data exists      Latest Ref Rng & Units 11/05/2022 11/06/2022 11/07/2022 11/08/2022 11/13/2022  Labs for ITP Cardiac and Pulmonary Rehab  TCO2 22 - 32 mmol/L - - - - 23   O2 Saturation % 71  80.9  68.9  84.7  -    Details            Capillary Blood Glucose: Lab Results  Component Value Date   GLUCAP 109 (H) 12/08/2022   GLUCAP 107 (H) 12/08/2022   GLUCAP 114 (H) 11/08/2022   GLUCAP 69 (L) 11/08/2022   GLUCAP 73 11/08/2022     Exercise Target Goals: Exercise Program Goal: Individual exercise prescription set using results from initial 6 min walk test and THRR while considering  patient's activity barriers and safety.   Exercise Prescription Goal: Initial exercise prescription builds to 30-45 minutes a day of aerobic activity, 2-3 days per week.  Home exercise guidelines will be given to patient during program as part of exercise prescription that the participant will acknowledge.  Activity Barriers & Risk Stratification:  Activity Barriers & Cardiac Risk Stratification - 12/04/22 1415       Activity Barriers & Cardiac Risk Stratification   Activity Barriers Decreased Ventricular Function    Cardiac Risk Stratification High   <5 METs on            6 Minute Walk:  6 Minute Walk     Row Name 12/04/22 1511         6 Minute Walk   Phase Initial     Distance 1490 feet     Walk Time 6 minutes      # of Rest Breaks 0     MPH 2.82     METS 3.56     RPE 11     Perceived Dyspnea  0     VO2 Peak 11.75     Symptoms No     Resting HR 87 bpm     Resting BP 100/68     Resting Oxygen Saturation  98 %     Exercise Oxygen Saturation  during 6 min walk 95 %     Max Ex. HR 109 bpm     Max Ex. BP 122/78     2 Minute Post BP 114/86              Oxygen Initial Assessment:   Oxygen Re-Evaluation:   Oxygen Discharge (Final Oxygen Re-Evaluation):   Initial Exercise Prescription:  Initial Exercise Prescription - 12/04/22 1500       Date of Initial Exercise RX and Referring Provider   Date 12/04/22    Referring Provider Arvilla Meres, MD    Expected Discharge Date 02/25/23      Treadmill   MPH 2    Grade 0    Minutes 15    METs 3      Bike   Level 1.5    Watts 60    Minutes 15    METs 3      Prescription Details   Frequency (times per week) 3    Duration Progress to 30 minutes of continuous aerobic without signs/symptoms of physical distress      Intensity   THRR 40-80% of Max Heartrate 65-130    Ratings of Perceived Exertion 11-13    Perceived Dyspnea 0-4      Progression   Progression Continue progressive overload as per policy without signs/symptoms or physical distress.      Resistance Training   Training Prescription Yes    Weight 4    Reps 10-15             Perform Capillary Blood Glucose checks as needed.  Exercise Prescription Changes:   Exercise Prescription Changes     Row Name 12/08/22 1250 12/29/22 1400 01/07/23 1400         Response to Exercise   Blood Pressure (Admit) 118/88 114/78 104/80     Blood Pressure (Exercise) 122/88 138/84 --     Blood Pressure (Exit) 122/78 108/78 110/76     Heart Rate (Admit) 98 bpm 85 bpm 89 bpm     Heart Rate (Exercise) 112 bpm 114 bpm 117 bpm     Heart Rate (Exit) 104  bpm 97 bpm 97 bpm     Rating of Perceived Exertion (Exercise) 12 11 11      Symptoms None None None     Comments Off to a good  start with exercise. Reviewed METs Reviewed METs and goals     Duration Continue with 30 min of aerobic exercise without signs/symptoms of physical distress. Continue with 30 min of aerobic exercise without signs/symptoms of physical distress. Continue with 30 min of aerobic exercise without signs/symptoms of physical distress.     Intensity THRR unchanged THRR unchanged THRR unchanged       Progression   Progression Continue to progress workloads to maintain intensity without signs/symptoms of physical distress. Continue to progress workloads to maintain intensity without signs/symptoms of physical distress. Continue to progress workloads to maintain intensity without signs/symptoms of physical distress.     Average METs 2.8 3.05 3.3       Resistance Training   Training Prescription Yes Yes No     Weight 4 4 No weights on Wednesdays     Reps 10-15 10-15 --     Time 10 Minutes 10 Minutes --       Interval Training   Interval Training No No No       Treadmill   MPH 2 2.5 2.8     Grade 0 0 1     Minutes 15 15 15      METs 2.53 2.91 3.53       Bike   Level 2 2.8 3.5     Watts 46 -- 59     Minutes 15 15 15      METs 3 3.2 3.3              Exercise Comments:   Exercise Comments     Row Name 12/08/22 1345 12/24/22 1525 12/29/22 1437 01/07/23 1500     Exercise Comments Patient tolerated first session of exercise well without symptoms. Introduced to exercise and stretching routine. Pt was due for MET review today but is out after cardioversion. Will complete upon patient return. Reviewed METs. Pt making good progress. Pt increased on both modalities today without complaints. Reviewed METs and goals. Pt is making progress. Pt was able to increase workload on treadmill today and will increase workload on bike next session.             Exercise Goals and Review:   Exercise Goals     Row Name 12/04/22 1346             Exercise Goals   Increase Physical Activity Yes        Intervention Provide advice, education, support and counseling about physical activity/exercise needs.;Develop an individualized exercise prescription for aerobic and resistive training based on initial evaluation findings, risk stratification, comorbidities and participant's personal goals.       Expected Outcomes Short Term: Attend rehab on a regular basis to increase amount of physical activity.;Long Term: Exercising regularly at least 3-5 days a week.;Long Term: Add in home exercise to make exercise part of routine and to increase amount of physical activity.       Increase Strength and Stamina Yes       Intervention Provide advice, education, support and counseling about physical activity/exercise needs.;Develop an individualized exercise prescription for aerobic and resistive training based on initial evaluation findings, risk stratification, comorbidities and participant's personal goals.       Expected Outcomes Short Term: Increase workloads from initial exercise prescription for resistance, speed, and METs.;Short  Term: Perform resistance training exercises routinely during rehab and add in resistance training at home;Long Term: Improve cardiorespiratory fitness, muscular endurance and strength as measured by increased METs and functional capacity ( )       Able to understand and use rate of perceived exertion (RPE) scale Yes       Intervention Provide education and explanation on how to use RPE scale       Expected Outcomes Short Term: Able to use RPE daily in rehab to express subjective intensity level;Long Term:  Able to use RPE to guide intensity level when exercising independently       Knowledge and understanding of Target Heart Rate Range (THRR) Yes       Intervention Provide education and explanation of THRR including how the numbers were predicted and where they are located for reference       Expected Outcomes Short Term: Able to state/look up THRR;Short Term: Able to use daily as  guideline for intensity in rehab;Long Term: Able to use THRR to govern intensity when exercising independently       Understanding of Exercise Prescription Yes       Intervention Provide education, explanation, and written materials on patient's individual exercise prescription       Expected Outcomes Short Term: Able to explain program exercise prescription;Long Term: Able to explain home exercise prescription to exercise independently                Exercise Goals Re-Evaluation :  Exercise Goals Re-Evaluation     Row Name 12/08/22 1345 01/07/23 1500 01/08/23 0850         Exercise Goal Re-Evaluation   Exercise Goals Review Increase Physical Activity;Increase Strength and Stamina;Able to understand and use rate of perceived exertion (RPE) scale Increase Physical Activity;Understanding of Exercise Prescription;Increase Strength and Stamina;Knowledge and understanding of Target Heart Rate Range (THRR);Able to understand and use rate of perceived exertion (RPE) scale --     Comments Patient able to understand and use RPE  scale appropriately. Reviewed METs and goals. Peak METs to date are 3.9. Pt feeels he is making progress on his goal on imrpoving strength and stamina. Pt see imrpovement in musle tone in his legs, also a goal. Pt has not gotten back to playing golf yet but plans to try ssoon. Pt alos has goal of an imrpoved EF. Pt echo coming up next week. --     Expected Outcomes Progress workloads as tolerated to increase cardiorespiratory fitness. Will continue to montior patient and progress exericse workloads as tolerated. --              Discharge Exercise Prescription (Final Exercise Prescription Changes):  Exercise Prescription Changes - 01/07/23 1400       Response to Exercise   Blood Pressure (Admit) 104/80    Blood Pressure (Exit) 110/76    Heart Rate (Admit) 89 bpm    Heart Rate (Exercise) 117 bpm    Heart Rate (Exit) 97 bpm    Rating of Perceived Exertion (Exercise)  11    Symptoms None    Comments Reviewed METs and goals    Duration Continue with 30 min of aerobic exercise without signs/symptoms of physical distress.    Intensity THRR unchanged      Progression   Progression Continue to progress workloads to maintain intensity without signs/symptoms of physical distress.    Average METs 3.3      Resistance Training   Training Prescription No    Weight No  weights on Wednesdays      Interval Training   Interval Training No      Treadmill   MPH 2.8    Grade 1    Minutes 15    METs 3.53      Bike   Level 3.5    Watts 59    Minutes 15    METs 3.3             Nutrition:  Target Goals: Understanding of nutrition guidelines, daily intake of sodium 1500mg , cholesterol 200mg , calories 30% from fat and 7% or less from saturated fats, daily to have 5 or more servings of fruits and vegetables.  Biometrics:  Pre Biometrics - 12/04/22 1319       Pre Biometrics   Waist Circumference 51.5 inches    Hip Circumference 47 inches    Waist to Hip Ratio 1.1 %    Triceps Skinfold 28 mm    % Body Fat 37.7 %    Grip Strength 54 kg    Flexibility 11.5 in    Single Leg Stand 14.68 seconds              Nutrition Therapy Plan and Nutrition Goals:  Nutrition Therapy & Goals - 01/09/23 1358       Nutrition Therapy   Diet Heart Healthy Diet      Personal Nutrition Goals   Nutrition Goal Patient to identify strategies for reducing cardiovascular risk by attending the [facility] education and nutrition series weekly.   goal in progress.   Personal Goal #2 Patient to improve diet quality by using the plate method as a guide for meal planning to include lean protein/plant protein, fruits, vegetables, whole grains, nonfat dairy as part of a well-balanced diet.   goal in progress.   Personal Goal #3 Patient to reduce sodium intake to 1500mg  per day   goal in progress.   Personal Goal #4 Patient to identify strategies for weight loss of 0.5-2.0#  per week.    Comments Goals in progress. Riyan has medical history HTN, CHF, Afib, OSA. He continues to attend the [facility] education and nutrition series regularly. He has starting making some dietary changes including reduced sodium intake and increased dietary fiber.  His A1c remains in a prediabetic; he is taking zepbound, jardiance. He reports improved snacking behaviors since re-starting zepbound. He is up 4.6# since starting with our program. We discussed multiple strategies for weight loss including the plate method, protein supplements, tracking intake, high protein/high fiber snacks, mindful eating, etc. Patient will benefit from participation in intensive cardiac rehab for nutrition, exercise, and lifestyle modification.      Intervention Plan   Intervention Prescribe, educate and counsel regarding individualized specific dietary modifications aiming towards targeted core components such as weight, hypertension, lipid management, diabetes, heart failure and other comorbidities.;Nutrition handout(s) given to patient.    Expected Outcomes Short Term Goal: Understand basic principles of dietary content, such as calories, fat, sodium, cholesterol and nutrients.;Long Term Goal: Adherence to prescribed nutrition plan.;Short Term Goal: A plan has been developed with personal nutrition goals set during dietitian appointment.             Nutrition Assessments:  MEDIFICTS Score Key: >=70 Need to make dietary changes  40-70 Heart Healthy Diet <= 40 Therapeutic Level Cholesterol Diet    Picture Your Plate Scores: <91 Unhealthy dietary pattern with much room for improvement. 41-50 Dietary pattern unlikely to meet recommendations for good health and room for improvement. 51-60 More  healthful dietary pattern, with some room for improvement.  >60 Healthy dietary pattern, although there may be some specific behaviors that could be improved.    Nutrition Goals Re-Evaluation:  Nutrition Goals  Re-Evaluation     Row Name 12/10/22 1600 01/09/23 1358           Goals   Current Weight 293 lb 3.4 oz (133 kg) 297 lb 9.9 oz (135 kg)      Comment A1c 6.4; no lipid panel available. Cr 1.53, GFR 52; other most recent labs A1c 6.4; no lipid panel available.      Expected Outcome Harold Park has medical history HTN, CHF, Afib, OSA. He has starting making some dietary changes including reduced sodium intake. His A1c remains in a prediabetic; he is taking zepbound, jardiance. Patient will benefit from participation in intensive cardiac rehab for nutrition, exercise, and lifestyle modification. Goals in progress. Harold Park has medical history HTN, CHF, Afib, OSA. He continues to attend the [facility] education and nutrition series regularly. He has starting making some dietary changes including reduced sodium intake and increased dietary fiber. His A1c remains in a prediabetic; he is taking zepbound, jardiance. He reports improved snacking behaviors since re-starting zepbound. He is up 4.6# since starting with our program. We discussed multiple strategies for weight loss including the plate method, protein supplements, tracking intake, high protein/high fiber snacks, mindful eating, etc. Patient will benefit from participation in intensive cardiac rehab for nutrition, exercise, and lifestyle modification.               Nutrition Goals Re-Evaluation:  Nutrition Goals Re-Evaluation     Row Name 12/10/22 1600 01/09/23 1358           Goals   Current Weight 293 lb 3.4 oz (133 kg) 297 lb 9.9 oz (135 kg)      Comment A1c 6.4; no lipid panel available. Cr 1.53, GFR 52; other most recent labs A1c 6.4; no lipid panel available.      Expected Outcome Harold Park has medical history HTN, CHF, Afib, OSA. He has starting making some dietary changes including reduced sodium intake. His A1c remains in a prediabetic; he is taking zepbound, jardiance. Patient will benefit from participation in intensive cardiac rehab for  nutrition, exercise, and lifestyle modification. Goals in progress. Harold Park has medical history HTN, CHF, Afib, OSA. He continues to attend the [facility] education and nutrition series regularly. He has starting making some dietary changes including reduced sodium intake and increased dietary fiber. His A1c remains in a prediabetic; he is taking zepbound, jardiance. He reports improved snacking behaviors since re-starting zepbound. He is up 4.6# since starting with our program. We discussed multiple strategies for weight loss including the plate method, protein supplements, tracking intake, high protein/high fiber snacks, mindful eating, etc. Patient will benefit from participation in intensive cardiac rehab for nutrition, exercise, and lifestyle modification.               Nutrition Goals Discharge (Final Nutrition Goals Re-Evaluation):  Nutrition Goals Re-Evaluation - 01/09/23 1358       Goals   Current Weight 297 lb 9.9 oz (135 kg)    Comment Cr 1.53, GFR 52; other most recent labs A1c 6.4; no lipid panel available.    Expected Outcome Goals in progress. Harold Park has medical history HTN, CHF, Afib, OSA. He continues to attend the [facility] education and nutrition series regularly. He has starting making some dietary changes including reduced sodium intake and increased dietary fiber. His A1c remains in  a prediabetic; he is taking zepbound, jardiance. He reports improved snacking behaviors since re-starting zepbound. He is up 4.6# since starting with our program. We discussed multiple strategies for weight loss including the plate method, protein supplements, tracking intake, high protein/high fiber snacks, mindful eating, etc. Patient will benefit from participation in intensive cardiac rehab for nutrition, exercise, and lifestyle modification.             Psychosocial: Target Goals: Acknowledge presence or absence of significant depression and/or stress, maximize coping skills, provide  positive support system. Participant is able to verbalize types and ability to use techniques and skills needed for reducing stress and depression.  Initial Review & Psychosocial Screening:  Initial Psych Review & Screening - 12/04/22 1409       Initial Review   Current issues with Current Depression;Current Psychotropic Meds;Current Stress Concerns    Source of Stress Concerns Unable to participate in former interests or hobbies;Occupation;Financial;Family;Chronic Illness    Comments Harold Park shared that he has had some depression since his hospitalization and stress due to working with heart issues and the financial stress of that. No current feelings of anxiety. Harold Park feels his cymbalta somewhat works for him, denies any need for additional resources at this time.      Family Dynamics   Good Support System? Yes   Has wife for support     Barriers   Psychosocial barriers to participate in program The patient should benefit from training in stress management and relaxation.      Screening Interventions   Interventions To provide support and resources with identified psychosocial needs;Provide feedback about the scores to participant;Encouraged to exercise    Expected Outcomes Short Term goal: Utilizing psychosocial counselor, staff and physician to assist with identification of specific Stressors or current issues interfering with healing process. Setting desired goal for each stressor or current issue identified.;Long Term Goal: Stressors or current issues are controlled or eliminated.;Short Term goal: Identification and review with participant of any Quality of Life or Depression concerns found by scoring the questionnaire.;Long Term goal: The participant improves quality of Life and PHQ9 Scores as seen by post scores and/or verbalization of changes             Quality of Life Scores:  Quality of Life - 12/04/22 1423       Quality of Life   Select Quality of Life      Quality of Life  Scores   Health/Function Pre 15.57 %    Socioeconomic Pre 26.43 %    Psych/Spiritual Pre 18.79 %    Family Pre 28.3 %    GLOBAL Pre 20.34 %            Scores of 19 and below usually indicate a poorer quality of life in these areas.  A difference of  2-3 points is a clinically meaningful difference.  A difference of 2-3 points in the total score of the Quality of Life Index has been associated with significant improvement in overall quality of life, self-image, physical symptoms, and general health in studies assessing change in quality of life.  PHQ-9: Review Flowsheet       12/04/2022 11/29/2021  Depression screen PHQ 2/9  Decreased Interest 1 1  Down, Depressed, Hopeless 1 0  PHQ - 2 Score 2 1  Altered sleeping 0 -  Tired, decreased energy 2 -  Change in appetite 0 -  Feeling bad or failure about yourself  1 -  Trouble concentrating 0 -  Moving slowly or fidgety/restless 0 -  Suicidal thoughts 0 -  PHQ-9 Score 5 -  Difficult doing work/chores Somewhat difficult -    Details           Interpretation of Total Score  Total Score Depression Severity:  1-4 = Minimal depression, 5-9 = Mild depression, 10-14 = Moderate depression, 15-19 = Moderately severe depression, 20-27 = Severe depression   Psychosocial Evaluation and Intervention:   Psychosocial Re-Evaluation:  Psychosocial Re-Evaluation     Row Name 12/10/22 0839 12/30/22 1418 01/27/23 1348         Psychosocial Re-Evaluation   Current issues with Current Psychotropic Meds;Current Depression;Current Stress Concerns Current Psychotropic Meds;Current Depression;Current Stress Concerns Current Psychotropic Meds;Current Depression;Current Stress Concerns     Comments No concerns voiced during first day of exercise at cardiac rehab. Will review quality of life questionaire in the upcoming week Reviewed quailityof life on 12/29/22. Harold Park says his depression and energy level has improved. 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 Harold Park says his depression and energy level has improved. Harold Park has not voiced any increased concerns or stressors during exercise at cardiac rehab.     Expected Outcomes Harold Park will have decreased or controlled depression/ stressors upon completion of cardiac rehab Harold Park will have decreased or controlled depression/ stressors upon completion of cardiac rehab Harold Park will have decreased or controlled depression/ stressors upon completion of cardiac rehab     Interventions Stress management education;Encouraged to attend Cardiac Rehabilitation for the exercise;Relaxation education Stress management education;Encouraged to attend Cardiac Rehabilitation for the exercise;Relaxation education Stress management education;Encouraged to attend Cardiac Rehabilitation for the exercise;Relaxation education     Continue Psychosocial Services  Follow up required by staff Follow up required by staff No Follow up required       Initial Review   Source of Stress Concerns Chronic Illness;Retirement/disability;Financial;Occupation Chronic Illness;Retirement/disability;Financial;Occupation Chronic Illness;Retirement/disability;Financial;Occupation     Comments Will continue to monitor and offer support as needed Will continue to monitor and offer support as needed Will continue to monitor and offer support as needed              Psychosocial Discharge (Final Psychosocial Re-Evaluation):  Psychosocial Re-Evaluation - 01/27/23 1348       Psychosocial Re-Evaluation   Current issues with Current Psychotropic Meds;Current Depression;Current Stress Concerns    Comments Harold Park says his depression and energy level has improved. Harold Park has not voiced any increased concerns or stressors during exercise at cardiac rehab.    Expected Outcomes Harold Park will have decreased or controlled depression/ stressors upon completion of cardiac rehab     Interventions Stress management education;Encouraged to attend Cardiac Rehabilitation for the exercise;Relaxation education    Continue Psychosocial Services  No Follow up required      Initial Review   Source of Stress Concerns Chronic Illness;Retirement/disability;Financial;Occupation    Comments Will continue to monitor and offer support as needed             Vocational Rehabilitation: Provide vocational rehab assistance to qualifying candidates.   Vocational Rehab Evaluation & Intervention:  Vocational Rehab - 12/04/22 1412       Initial Vocational Rehab Evaluation & Intervention   Assessment shows need for Vocational Rehabilitation No   Harold Park is working            Education: Education Goals: Education classes will be provided on a weekly basis, covering required  topics. Participant will state understanding/return demonstration of topics presented.    Education     Row Name 12/08/22 1600     Education   Cardiac Education Topics Pritikin   Glass blower/designer Nutrition   Nutrition Workshop Targeting Your Nutrition Priorities   Instruction Review Code 1- Verbalizes Understanding   Class Start Time 1400   Class Stop Time 1440   Class Time Calculation (min) 40 min    Row Name 12/12/22 1500     Education   Cardiac Education Topics Pritikin   Writer General Education   General Education Hypertension and Heart Disease   Instruction Review Code 1- Verbalizes Understanding   Class Start Time 1400   Class Stop Time 1440   Class Time Calculation (min) 40 min    Row Name 12/15/22 1500     Education   Cardiac Education Topics Pritikin   Licensed conveyancer Nutrition   Nutrition Dining Out - Part 1   Instruction Review Code 1- Verbalizes Understanding   Class Start Time 1400   Class Stop Time 1445   Class Time  Calculation (min) 45 min    Row Name 12/29/22 1500     Education   Cardiac Education Topics Pritikin   Glass blower/designer Nutrition   Nutrition Workshop Fueling a Forensic psychologist   Instruction Review Code 1- Tax inspector   Class Start Time 1400   Class Stop Time 1445   Class Time Calculation (min) 45 min    Row Name 12/31/22 1500     Education   Cardiac Education Topics Pritikin   Customer service manager   Weekly Topic International Cuisine- Spotlight on the [employer] Zones   Instruction Review Code 1- Verbalizes Understanding   Class Start Time 1400   Class Stop Time 1441   Class Time Calculation (min) 41 min    Row Name 01/02/23 1400     Education   Cardiac Education Topics Pritikin   Psychologist, forensic Exercise Education   Exercise Education Improving Performance   Instruction Review Code 1- Verbalizes Understanding   Class Start Time 1355   Class Stop Time 1433   Class Time Calculation (min) 38 min    Row Name 01/05/23 1600     Education   Cardiac Education Topics Pritikin   Geographical information systems officer Psychosocial   Psychosocial Workshop Healthy Sleep for a Healthy Heart   Instruction Review Code 1- Verbalizes Understanding   Class Start Time 1400   Class Stop Time 1450   Class Time Calculation (min) 50 min    Row Name 01/07/23 1500     Education   Cardiac Education Topics Pritikin   Customer service manager   Weekly Topic Simple Sides and Sauces   Instruction Review Code 1- Verbalizes Understanding   Class Start Time 1400   Class Stop Time 1440   Class Time Calculation (min) 40 min    Row Name 01/09/23 1400  Education   Cardiac Education Topics Pritikin   Science writer Psychosocial   Psychosocial How Our Thoughts Can Heal Our Hearts   Instruction Review Code 1- Verbalizes Understanding   Class Start Time 1355   Class Stop Time 1428   Class Time Calculation (min) 33 min    Row Name 01/14/23 1500     Education   Cardiac Education Topics Pritikin   Customer service manager   Weekly Topic Powerhouse Plant-Based Proteins   Instruction Review Code 1- Verbalizes Understanding   Class Start Time 1400   Class Stop Time 1438   Class Time Calculation (min) 38 min    Row Name 01/19/23 1600     Education   Cardiac Education Topics Pritikin     Workshops   Biomedical scientist Psychosocial   Psychosocial Workshop From Head to Heart: The Power of a Healthy Outlook   Instruction Review Code 1- Verbalizes Understanding   Class Start Time 1400   Class Stop Time 1450   Class Time Calculation (min) 50 min    Row Name 01/21/23 1500     Education   Cardiac Education Topics Pritikin   Customer service manager   Weekly Topic Adding Flavor - Sodium-Free   Instruction Review Code 1- Verbalizes Understanding   Class Start Time 1400   Class Stop Time 1440   Class Time Calculation (min) 40 min    Row Name 01/23/23 1500     Education   Cardiac Education Topics Pritikin   Hospital doctor Education   General Education Heart Disease Risk Reduction   Instruction Review Code 1- Verbalizes Understanding   Class Start Time 1400   Class Stop Time 1433   Class Time Calculation (min) 33 min    Row Name 01/26/23 1500     Education   Cardiac Education Topics Pritikin   [employer] Workshops     Workshops   Educator Exercise Physiologist   Select Exercise   Exercise Workshop Location manager and Fall Prevention   Instruction Review Code 1- Verbalizes Understanding    Class Start Time 1406   Class Stop Time 1500   Class Time Calculation (min) 54 min            Core Videos: Exercise    Move It!  Clinical staff conducted group or individual video education with verbal and written material and guidebook.  Patient learns the recommended Pritikin exercise program. Exercise with the goal of living a long, healthy life. Some of the health benefits of exercise include controlled diabetes, healthier blood pressure levels, improved cholesterol levels, improved heart and lung capacity, improved sleep, and better body composition. Everyone should speak with their doctor before starting or changing an exercise routine.  Biomechanical Limitations Clinical staff conducted group or individual video education with verbal and written material and guidebook.  Patient learns how biomechanical limitations can impact exercise and how we can mitigate and possibly overcome limitations to have an impactful and balanced exercise routine.  Body Composition Clinical staff conducted group or individual video education with verbal and written material and guidebook.  Patient learns that body composition (ratio of muscle mass to fat mass) is a key component to  assessing overall fitness, rather than body weight alone. Increased fat mass, especially visceral belly fat, can put [place] at increased risk for metabolic syndrome, type 2 diabetes, heart disease, and even death. It is recommended to combine diet and exercise (cardiovascular and resistance training) to improve your body composition. Seek guidance from your physician and exercise physiologist before implementing an exercise routine.  Exercise Action Plan Clinical staff conducted group or individual video education with verbal and written material and guidebook.  Patient learns the recommended strategies to achieve and enjoy long-term exercise adherence, including variety, self-motivation, self-efficacy, and positive decision  making. Benefits of exercise include fitness, good health, weight management, more energy, better sleep, less stress, and overall well-being.  Medical   Heart Disease Risk Reduction Clinical staff conducted group or individual video education with verbal and written material and guidebook.  Patient learns our heart is our most vital organ as it circulates oxygen, nutrients, white blood cells, and hormones throughout the entire body, and carries waste away. Data supports a plant-based eating plan like the Pritikin Program for its effectiveness in slowing progression of and reversing heart disease. The video provides a number of recommendations to address heart disease.   Metabolic Syndrome and Belly Fat  Clinical staff conducted group or individual video education with verbal and written material and guidebook.  Patient learns what metabolic syndrome is, how it leads to heart disease, and how one can reverse it and keep it from coming back. You have metabolic syndrome if you have 3 of the following 5 criteria: abdominal obesity, high blood pressure, high triglycerides, low HDL cholesterol, and high blood sugar.  Hypertension and Heart Disease Clinical staff conducted group or individual video education with verbal and written material and guidebook.  Patient learns that high blood pressure, or hypertension, is very common in the [place]. Hypertension is largely due to excessive salt intake, but other important risk factors include being overweight, physical inactivity, drinking too much alcohol, smoking, and not eating enough potassium from fruits and vegetables. High blood pressure is a leading risk factor for heart attack, stroke, congestive heart failure, dementia, kidney failure, and premature death. Long-term effects of excessive salt intake include stiffening of the arteries and thickening of heart muscle and organ damage. Recommendations include ways to reduce hypertension and the risk of  heart disease.  Diseases of Our Time - Focusing on Diabetes Clinical staff conducted group or individual video education with verbal and written material and guidebook.  Patient learns why the best way to stop diseases of our time is prevention, through food and other lifestyle changes. Medicine (such as prescription pills and surgeries) is often only a Band-Aid on the problem, not a long-term solution. Most common diseases of our time include obesity, type 2 diabetes, hypertension, heart disease, and cancer. The Pritikin Program is recommended and has been proven to help reduce, reverse, and/or prevent the damaging effects of metabolic syndrome.  Nutrition   Overview of the Pritikin Eating Plan  Clinical staff conducted group or individual video education with verbal and written material and guidebook.  Patient learns about the Pritikin Eating Plan for disease risk reduction. The Pritikin Eating Plan emphasizes a wide variety of unrefined, minimally-processed carbohydrates, like fruits, vegetables, whole grains, and legumes. Go, Caution, and Stop food choices are explained. Plant-based and lean animal proteins are emphasized. Rationale provided for low sodium intake for blood pressure control, low added sugars for blood sugar stabilization, and low added fats and oils for coronary artery disease  risk reduction and weight management.  Calorie Density  Clinical staff conducted group or individual video education with verbal and written material and guidebook.  Patient learns about calorie density and how it impacts the Pritikin Eating Plan. Knowing the characteristics of the food you choose will help you decide whether those foods will lead to weight gain or weight loss, and whether you want to consume more or less of them. Weight loss is usually a side effect of the Pritikin Eating Plan because of its focus on low calorie-dense foods.  Label Reading  Clinical staff conducted group or individual video  education with verbal and written material and guidebook.  Patient learns about the Pritikin recommended label reading guidelines and corresponding recommendations regarding calorie density, added sugars, sodium content, and whole grains.  Dining Out - Part 1  Clinical staff conducted group or individual video education with verbal and written material and guidebook.  Patient learns that restaurant meals can be sabotaging because they can be so high in calories, fat, sodium, and/or sugar. Patient learns recommended strategies on how to positively address this and avoid unhealthy pitfalls.  Facts on Fats  Clinical staff conducted group or individual video education with verbal and written material and guidebook.  Patient learns that lifestyle modifications can be just as effective, if not more so, as many medications for lowering your risk of heart disease. A Pritikin lifestyle can help to reduce your risk of inflammation and atherosclerosis (cholesterol build-up, or plaque, in the artery walls). Lifestyle interventions such as dietary choices and physical activity address the cause of atherosclerosis. A review of the types of fats and their impact on blood cholesterol levels, along with dietary recommendations to reduce fat intake is also included.  Nutrition Action Plan  Clinical staff conducted group or individual video education with verbal and written material and guidebook.  Patient learns how to incorporate Pritikin recommendations into their lifestyle. Recommendations include planning and keeping personal health goals in mind as an important part of their success.  Healthy Mind-Set    Healthy Minds, Bodies, Hearts  Clinical staff conducted group or individual video education with verbal and written material and guidebook.  Patient learns how to identify when they are stressed. Video will discuss the impact of that stress, as well as the many benefits of stress management. Patient will also  be introduced to stress management techniques. The way we think, act, and feel has an impact on our hearts.  How Our Thoughts Can Heal Our Hearts  Clinical staff conducted group or individual video education with verbal and written material and guidebook.  Patient learns that negative thoughts can cause depression and anxiety. This can result in negative lifestyle behavior and serious health problems. Cognitive behavioral therapy is an effective method to help control our thoughts in order to change and improve our emotional outlook.  Additional Videos:  Exercise    Improving Performance  Clinical staff conducted group or individual video education with verbal and written material and guidebook.  Patient learns to use a non-linear approach by alternating intensity levels and lengths of time spent exercising to help burn more calories and lose more body fat. Cardiovascular exercise helps improve heart health, metabolism, hormonal balance, blood sugar control, and recovery from fatigue. Resistance training improves strength, endurance, balance, coordination, reaction time, metabolism, and muscle mass. Flexibility exercise improves circulation, posture, and balance. Seek guidance from your physician and exercise physiologist before implementing an exercise routine and learn your capabilities and proper form for all  exercise.  Introduction to Yoga  Clinical staff conducted group or individual video education with verbal and written material and guidebook.  Patient learns about yoga, a discipline of the coming together of mind, breath, and body. The benefits of yoga include improved flexibility, improved range of motion, better posture and core strength, increased lung function, weight loss, and positive self-image. Yoga's heart health benefits include lowered blood pressure, healthier heart rate, decreased cholesterol and triglyceride levels, improved immune function, and reduced stress. Seek guidance  from your physician and exercise physiologist before implementing an exercise routine and learn your capabilities and proper form for all exercise.  Medical   Aging: Enhancing Your Quality of Life  Clinical staff conducted group or individual video education with verbal and written material and guidebook.  Patient learns key strategies and recommendations to stay in good physical health and enhance quality of life, such as prevention strategies, having an advocate, securing a Health Care Proxy and Power of Attorney, and keeping a list of medications and system for tracking them. It also discusses how to avoid risk for bone loss.  Biology of Weight Control  Clinical staff conducted group or individual video education with verbal and written material and guidebook.  Patient learns that weight gain occurs because we consume more calories than we burn (eating more, moving less). Even if your body weight is normal, you may have higher ratios of fat compared to muscle mass. Too much body fat puts you at increased risk for cardiovascular disease, heart attack, stroke, type 2 diabetes, and obesity-related cancers. In addition to exercise, following the Pritikin Eating Plan can help reduce your risk.  Decoding Lab Results  Clinical staff conducted group or individual video education with verbal and written material and guidebook.  Patient learns that lab test reflects one measurement whose values change over time and are influenced by many factors, including medication, stress, sleep, exercise, food, hydration, pre-existing medical conditions, and more. It is recommended to use the knowledge from this video to become more involved with your lab results and evaluate your numbers to speak with your doctor.   Diseases of Our Time - Overview  Clinical staff conducted group or individual video education with verbal and written material and guidebook.  Patient learns that according to the CDC, 50% to 70% of  chronic diseases (such as obesity, type 2 diabetes, elevated lipids, hypertension, and heart disease) are avoidable through lifestyle improvements including healthier food choices, listening to satiety cues, and increased physical activity.  Sleep Disorders Clinical staff conducted group or individual video education with verbal and written material and guidebook.  Patient learns how good quality and duration of sleep are important to overall health and well-being. Patient also learns about sleep disorders and how they impact health along with recommendations to address them, including discussing with a physician.  Nutrition  Dining Out - Part 2 Clinical staff conducted group or individual video education with verbal and written material and guidebook.  Patient learns how to plan ahead and communicate in order to maximize their dining experience in a healthy and nutritious manner. Included are recommended food choices based on the type of restaurant the patient is visiting.   Fueling a Banker conducted group or individual video education with verbal and written material and guidebook.  There is a strong connection between our food choices and our health. Diseases like obesity and type 2 diabetes are very prevalent and are in large-part due to lifestyle choices. The Pritikin  Eating Plan provides plenty of food and hunger-curbing satisfaction. It is easy to follow, affordable, and helps reduce health risks.  Menu Workshop  Clinical staff conducted group or individual video education with verbal and written material and guidebook.  Patient learns that restaurant meals can sabotage health goals because they are often packed with calories, fat, sodium, and sugar. Recommendations include strategies to plan ahead and to communicate with the manager, chef, or server to help order a healthier meal.  Planning Your Eating Strategy  Clinical staff conducted group or individual video  education with verbal and written material and guidebook.  Patient learns about the Pritikin Eating Plan and its benefit of reducing the risk of disease. The Pritikin Eating Plan does not focus on calories. Instead, it emphasizes high-quality, nutrient-rich foods. By knowing the characteristics of the foods, we choose, we can determine their calorie density and make informed decisions.  Targeting Your Nutrition Priorities  Clinical staff conducted group or individual video education with verbal and written material and guidebook.  Patient learns that lifestyle habits have a tremendous impact on disease risk and progression. This video provides eating and physical activity recommendations based on your personal health goals, such as reducing LDL cholesterol, losing weight, preventing or controlling type 2 diabetes, and reducing high blood pressure.  Vitamins and Minerals  Clinical staff conducted group or individual video education with verbal and written material and guidebook.  Patient learns different ways to obtain key vitamins and minerals, including through a recommended healthy diet. It is important to discuss all supplements you take with your doctor.   Healthy Mind-Set    Smoking Cessation  Clinical staff conducted group or individual video education with verbal and written material and guidebook.  Patient learns that cigarette smoking and tobacco addiction pose a serious health risk which affects millions of people. Stopping smoking will significantly reduce the risk of heart disease, lung disease, and many forms of cancer. Recommended strategies for quitting are covered, including working with your doctor to develop a successful plan.  Culinary   Becoming a Set designer conducted group or individual video education with verbal and written material and guidebook.  Patient learns that cooking at home can be healthy, cost-effective, quick, and puts them in control. Keys to  cooking healthy recipes will include looking at your recipe, assessing your equipment needs, planning ahead, making it simple, choosing cost-effective seasonal ingredients, and limiting the use of added fats, salts, and sugars.  Cooking - Breakfast and Snacks  Clinical staff conducted group or individual video education with verbal and written material and guidebook.  Patient learns how important breakfast is to satiety and nutrition through the entire day. Recommendations include key foods to eat during breakfast to help stabilize blood sugar levels and to prevent overeating at meals later in the day. Planning ahead is also a key component.  Cooking - Educational psychologist conducted group or individual video education with verbal and written material and guidebook.  Patient learns eating strategies to improve overall health, including an approach to cook more at home. Recommendations include thinking of animal protein as a side on your plate rather than center stage and focusing instead on lower calorie dense options like vegetables, fruits, whole grains, and plant-based proteins, such as beans. Making sauces in large quantities to freeze for later and leaving the skin on your vegetables are also recommended to maximize your experience.  Cooking - Healthy Salads and Dressing Clinical staff conducted group  or individual video education with verbal and written material and guidebook.  Patient learns that vegetables, fruits, whole grains, and legumes are the foundations of the Pritikin Eating Plan. Recommendations include how to incorporate each of these in flavorful and healthy salads, and how to create homemade salad dressings. Proper handling of ingredients is also covered. Cooking - Soups and [employer] - Soups and Desserts Clinical staff conducted group or individual video education with verbal and written material and guidebook.  Patient learns that Pritikin soups and desserts  make for easy, nutritious, and delicious snacks and meal components that are low in sodium, fat, sugar, and calorie density, while high in vitamins, minerals, and filling fiber. Recommendations include simple and healthy ideas for soups and desserts.   Overview     The Pritikin Solution Program Overview Clinical staff conducted group or individual video education with verbal and written material and guidebook.  Patient learns that the results of the Pritikin Program have been documented in more than 100 articles published in peer-reviewed journals, and the benefits include reducing risk factors for (and, in some cases, even reversing) high cholesterol, high blood pressure, type 2 diabetes, obesity, and more! An overview of the three key pillars of the Pritikin Program will be covered: eating well, doing regular exercise, and having a healthy mind-set.  WORKSHOPS  Exercise: Exercise Basics: Building Your Action Plan Clinical staff led group instruction and group discussion with PowerPoint presentation and patient guidebook. To enhance the learning environment the use of posters, models and videos may be added. At the conclusion of this workshop, patients will comprehend the difference between physical activity and exercise, as well as the benefits of incorporating both, into their routine. Patients will understand the FITT (Frequency, Intensity, Time, and Type) principle and how to use it to build an exercise action plan. In addition, safety concerns and other considerations for exercise and cardiac rehab will be addressed by the presenter. The purpose of this lesson is to promote a comprehensive and effective weekly exercise routine in order to improve patients' overall level of fitness.   Managing Heart Disease: Your Path to a Healthier Heart Clinical staff led group instruction and group discussion with PowerPoint presentation and patient guidebook. To enhance the learning environment the use  of posters, models and videos may be added.At the conclusion of this workshop, patients will understand the anatomy and physiology of the heart. Additionally, they will understand how Pritikin's three pillars impact the risk factors, the progression, and the management of heart disease.  The purpose of this lesson is to provide a high-level overview of the heart, heart disease, and how the Pritikin lifestyle positively impacts risk factors.  Exercise Biomechanics Clinical staff led group instruction and group discussion with PowerPoint presentation and patient guidebook. To enhance the learning environment the use of posters, models and videos may be added. Patients will learn how the structural parts of their bodies function and how these functions impact their daily activities, movement, and exercise. Patients will learn how to promote a neutral spine, learn how to manage pain, and identify ways to improve their physical movement in order to promote healthy living. The purpose of this lesson is to expose patients to common physical limitations that impact physical activity. Participants will learn practical ways to adapt and manage aches and pains, and to minimize their effect on regular exercise. Patients will learn how to maintain good posture while sitting, walking, and lifting.  Balance Training and Fall Prevention  Clinical staff  led group instruction and group discussion with PowerPoint presentation and patient guidebook. To enhance the learning environment the use of posters, models and videos may be added. At the conclusion of this workshop, patients will understand the importance of their sensorimotor skills (vision, proprioception, and the vestibular system) in maintaining their ability to balance as they age. Patients will apply a variety of balancing exercises that are appropriate for their current level of function. Patients will understand the common causes for poor balance,  possible solutions to these problems, and ways to modify their physical environment in order to minimize their fall risk. The purpose of this lesson is to teach patients about the importance of maintaining balance as they age and ways to minimize their risk of falling.  WORKSHOPS   Nutrition:  Fueling a Ship broker led group instruction and group discussion with PowerPoint presentation and patient guidebook. To enhance the learning environment the use of posters, models and videos may be added. Patients will review the foundational principles of the Pritikin Eating Plan and understand what constitutes a serving size in each of the food groups. Patients will also learn Pritikin-friendly foods that are better choices when away from home and review make-ahead meal and snack options. Calorie density will be reviewed and applied to three nutrition priorities: weight maintenance, weight loss, and weight gain. The purpose of this lesson is to reinforce (in a group setting) the key concepts around what patients are recommended to eat and how to apply these guidelines when away from home by planning and selecting Pritikin-friendly options. Patients will understand how calorie density may be adjusted for different weight management goals.  Mindful Eating  Clinical staff led group instruction and group discussion with PowerPoint presentation and patient guidebook. To enhance the learning environment the use of posters, models and videos may be added. Patients will briefly review the concepts of the Pritikin Eating Plan and the importance of low-calorie dense foods. The concept of mindful eating will be introduced as well as the importance of paying attention to internal hunger signals. Triggers for non-hunger eating and techniques for dealing with triggers will be explored. The purpose of this lesson is to provide patients with the opportunity to review the basic principles of the Pritikin Eating  Plan, discuss the value of eating mindfully and how to measure internal cues of hunger and fullness using the Hunger Scale. Patients will also discuss reasons for non-hunger eating and learn strategies to use for controlling emotional eating.  Targeting Your Nutrition Priorities Clinical staff led group instruction and group discussion with PowerPoint presentation and patient guidebook. To enhance the learning environment the use of posters, models and videos may be added. Patients will learn how to determine their genetic susceptibility to disease by reviewing their family history. Patients will gain insight into the importance of diet as part of an overall healthy lifestyle in mitigating the impact of genetics and other environmental insults. The purpose of this lesson is to provide patients with the opportunity to assess their personal nutrition priorities by looking at their family history, their own health history and current risk factors. Patients will also be able to discuss ways of prioritizing and modifying the Pritikin Eating Plan for their highest risk areas  Menu  Clinical staff led group instruction and group discussion with PowerPoint presentation and patient guidebook. To enhance the learning environment the use of posters, models and videos may be added. Using menus brought in from [employer], or printed from [employer],  patients will apply the Pritikin dining out guidelines that were presented in the [employer] video. Patients will also be able to practice these guidelines in a variety of provided scenarios. The purpose of this lesson is to provide patients with the opportunity to practice hands-on learning of the Pritikin Dining Out guidelines with actual menus and practice scenarios.  Label Reading Clinical staff led group instruction and group discussion with PowerPoint presentation and patient guidebook. To enhance the learning environment the use of  posters, models and videos may be added. Patients will review and discuss the Pritikin label reading guidelines presented in Pritikin's Label Reading Educational series video. Using fool labels brought in from local grocery stores and markets, patients will apply the label reading guidelines and determine if the packaged food meet the Pritikin guidelines. The purpose of this lesson is to provide patients with the opportunity to review, discuss, and practice hands-on learning of the Pritikin Label Reading guidelines with actual packaged food labels. Cooking School  Pritikin's [employer] are designed to teach patients ways to prepare quick, simple, and affordable recipes at home. The importance of nutrition's role in chronic disease risk reduction is reflected in its emphasis in the overall Pritikin program. By learning how to prepare essential core Pritikin Eating Plan recipes, patients will increase control over what they eat; be able to customize the flavor of foods without the use of added salt, sugar, or fat; and improve the quality of the food they consume. By learning a set of core recipes which are easily assembled, quickly prepared, and affordable, patients are more likely to prepare more healthy foods at home. These workshops focus on convenient breakfasts, simple entres, side dishes, and desserts which can be prepared with minimal effort and are consistent with nutrition recommendations for cardiovascular risk reduction. Cooking [employer] are taught by a Armed forces logistics/support/administrative officer (RD) who has been trained by the [employer]. The chef or RD has a clear understanding of the importance of minimizing - if not completely eliminating - added fat, sugar, and sodium in recipes. Throughout the series of Cooking School Workshop sessions, patients will learn about healthy ingredients and efficient methods of cooking to build confidence in their capability to  prepare    Cooking School weekly topics:  Adding Flavor- Sodium-Free  Fast and Healthy Breakfasts  Powerhouse Plant-Based Proteins  Satisfying Salads and Dressings  Simple Sides and Sauces  International Cuisine-Spotlight on the [employer] Zones  Delicious Desserts  Savory Soups  [employer] - Meals in a Astronomer Appetizers and Snacks  Comforting Weekend Breakfasts  One-Pot Wonders   Fast Evening Meals  Landscape architect Your Pritikin Plate  WORKSHOPS   Healthy Mindset (Psychosocial):  Focused Goals, Sustainable Changes Clinical staff led group instruction and group discussion with PowerPoint presentation and patient guidebook. To enhance the learning environment the use of posters, models and videos may be added. Patients will be able to apply effective goal setting strategies to establish at least one personal goal, and then take consistent, meaningful action toward that goal. They will learn to identify common barriers to achieving personal goals and develop strategies to overcome them. Patients will also gain an understanding of how our mind-set can impact our ability to achieve goals and the importance of cultivating a positive and growth-oriented mind-set. The purpose of this lesson is to provide patients with a deeper understanding of how to set and achieve personal goals, as well as the  tools and strategies needed to overcome common obstacles which may arise along the way.  From Head to Heart: The Power of a Healthy Outlook  Clinical staff led group instruction and group discussion with PowerPoint presentation and patient guidebook. To enhance the learning environment the use of posters, models and videos may be added. Patients will be able to recognize and describe the impact of emotions and mood on physical health. They will discover the importance of self-care and explore self-care practices which may work for them. Patients will also learn how to utilize  the 4 C's to cultivate a healthier outlook and better manage stress and challenges. The purpose of this lesson is to demonstrate to patients how a healthy outlook is an essential part of maintaining good health, especially as they continue their cardiac rehab journey.  Healthy Sleep for a Healthy Heart Clinical staff led group instruction and group discussion with PowerPoint presentation and patient guidebook. To enhance the learning environment the use of posters, models and videos may be added. At the conclusion of this workshop, patients will be able to demonstrate knowledge of the importance of sleep to overall health, well-being, and quality of life. They will understand the symptoms of, and treatments for, common sleep disorders. Patients will also be able to identify daytime and nighttime behaviors which impact sleep, and they will be able to apply these tools to help manage sleep-related challenges. The purpose of this lesson is to provide patients with a general overview of sleep and outline the importance of quality sleep. Patients will learn about a few of the most common sleep disorders. Patients will also be introduced to the concept of "sleep hygiene," and discover ways to self-manage certain sleeping problems through simple daily behavior changes. Finally, the workshop will motivate patients by clarifying the links between quality sleep and their goals of heart-healthy living.   Recognizing and Reducing Stress Clinical staff led group instruction and group discussion with PowerPoint presentation and patient guidebook. To enhance the learning environment the use of posters, models and videos may be added. At the conclusion of this workshop, patients will be able to understand the types of stress reactions, differentiate between acute and chronic stress, and recognize the impact that chronic stress has on their health. They will also be able to apply different coping mechanisms, such as reframing  negative self-talk. Patients will have the opportunity to practice a variety of stress management techniques, such as deep abdominal breathing, progressive muscle relaxation, and/or guided imagery.  The purpose of this lesson is to educate patients on the role of stress in their lives and to provide healthy techniques for coping with it.  Learning Barriers/Preferences:  Learning Barriers/Preferences - 12/04/22 1412       Learning Barriers/Preferences   Learning Preferences Group Instruction;Individual Instruction;Skilled Demonstration;Verbal Instruction             Education Topics:  Knowledge Questionnaire Score:  Knowledge Questionnaire Score - 12/04/22 1412       Knowledge Questionnaire Score   Pre Score 22/24             Core Components/Risk Factors/Patient Goals at Admission:  Personal Goals and Risk Factors at Admission - 12/04/22 1413       Core Components/Risk Factors/Patient Goals on Admission    Weight Management Yes;Obesity;Weight Loss    Intervention Weight Management: Develop a combined nutrition and exercise program designed to reach desired caloric intake, while maintaining appropriate intake of nutrient and fiber, sodium and fats, and appropriate  energy expenditure required for the weight goal.;Weight Management: Provide education and appropriate resources to help participant work on and attain dietary goals.;Weight Management/Obesity: Establish reasonable short term and long term weight goals.;Obesity: Provide education and appropriate resources to help participant work on and attain dietary goals.    Admit Weight 292 lb 15.9 oz (132.9 kg)    Expected Outcomes Short Term: Continue to assess and modify interventions until short term weight is achieved;Long Term: Adherence to nutrition and physical activity/exercise program aimed toward attainment of established weight goal;Weight Loss: Understanding of general recommendations for a balanced deficit meal plan,  which promotes 1-2 lb weight loss per week and includes a negative energy balance of [id number] kcal/d;Understanding recommendations for meals to include 15-35% energy as protein, 25-35% energy from fat, 35-60% energy from carbohydrates, less than 200mg  of dietary cholesterol, 20-35 gm of total fiber daily;Understanding of distribution of calorie intake throughout the day with the consumption of 4-5 meals/snacks    Heart Failure Yes    Intervention Provide a combined exercise and nutrition program that is supplemented with education, support and counseling about heart failure. Directed toward relieving symptoms such as shortness of breath, decreased exercise tolerance, and extremity edema.    Expected Outcomes Improve functional capacity of life;Short term: Attendance in program 2-3 days a week with increased exercise capacity. Reported lower sodium intake. Reported increased fruit and vegetable intake. Reports medication compliance.;Short term: Daily weights obtained and reported for increase. Utilizing diuretic protocols set by physician.;Long term: Adoption of self-care skills and reduction of barriers for early signs and symptoms recognition and intervention leading to self-care maintenance.    Hypertension Yes    Intervention Monitor prescription use compliance.;Provide education on lifestyle modifcations including regular physical activity/exercise, weight management, moderate sodium restriction and increased consumption of fresh fruit, vegetables, and low fat dairy, alcohol moderation, and smoking cessation.    Expected Outcomes Short Term: Continued assessment and intervention until BP is < 140/34mm HG in hypertensive participants. < 130/66mm HG in hypertensive participants with diabetes, heart failure or chronic kidney disease.;Long Term: Maintenance of blood pressure at goal levels.    Stress Yes    Intervention Offer individual and/or small group education and counseling on adjustment to heart  disease, stress management and health-related lifestyle change. Teach and support self-help strategies.;Refer participants experiencing significant psychosocial distress to appropriate mental health specialists for further evaluation and treatment. When possible, include family members and significant others in education/counseling sessions.    Expected Outcomes Short Term: Participant demonstrates changes in health-related behavior, relaxation and other stress management skills, ability to obtain effective social support, and compliance with psychotropic medications if prescribed.;Long Term: Emotional wellbeing is indicated by absence of clinically significant psychosocial distress or social isolation.             Core Components/Risk Factors/Patient Goals Review:   Goals and Risk Factor Review     Row Name 12/10/22 0849 12/30/22 1423 01/27/23 1351         Core Components/Risk Factors/Patient Goals Review   Personal Goals Review Weight Management/Obesity;Heart Failure;Hypertension;Stress Weight Management/Obesity;Heart Failure;Hypertension;Stress Weight Management/Obesity;Heart Failure;Hypertension;Stress     Review Harold Park did well with exercise on his first day of exercise at cardiac rehab. Vital signs were stable. CBG's were spot checked. WNL. Harold Park is doing well with exercise  cardiac rehab. Vital signs have been  stable post ablation Harold Park is doing well with exercise  cardiac rehab. Vital signs have been stable. Harold Park has gained 3.1 kg since starting cardiac rehab. Medication changes  noted.     Expected Outcomes Harold Park will continue to participate in cardiac rehab for exercise, nutrition and lifestyle modifications Harold Park will continue to participate in cardiac rehab for exercise, nutrition and lifestyle modifications Harold Park will continue to participate in cardiac rehab for exercise, nutrition and lifestyle modifications              Core Components/Risk Factors/Patient Goals at Discharge (Final  Review):   Goals and Risk Factor Review - 01/27/23 1351       Core Components/Risk Factors/Patient Goals Review   Personal Goals Review Weight Management/Obesity;Heart Failure;Hypertension;Stress    Review Harold Park is doing well with exercise  cardiac rehab. Vital signs have been stable. Harold Park has gained 3.1 kg since starting cardiac rehab. Medication changes noted.    Expected Outcomes Harold Park will continue to participate in cardiac rehab for exercise, nutrition and lifestyle modifications             ITP Comments:  ITP Comments     Row Name 12/04/22 1320 12/09/22 0908 12/30/22 1416 01/27/23 1347     ITP Comments Dr. Armanda Magic medical director. Introduction to pritikin education/ intensive cardiac rehab. Initital orientation packet reviewed with patient. 30 Day ITP Review. Harold Park started cardiac rehab on 12/08/22. Harold Park did well with exercise. 30 Day ITP Review. Mike returned to exercise at  cardiac rehab post ablation per Christy Sartorius NP. 30 Day ITP Review. Harold Park has good attendance and participation with  exercise at  cardiac rehab             Comments: See ITP comments.Thayer Headings RN BSN

## 2023-01-28 ENCOUNTER — Other Ambulatory Visit: Payer: Self-pay

## 2023-01-28 ENCOUNTER — Encounter (HOSPITAL_COMMUNITY)
Admission: RE | Admit: 2023-01-28 | Discharge: 2023-01-28 | Disposition: A | Discharge status: Home / Self Care | Payer: BC Managed Care – PPO | Source: Ambulatory Visit | Attending: Internal Medicine | Admitting: Internal Medicine

## 2023-01-28 ENCOUNTER — Other Ambulatory Visit: Payer: Self-pay | Admitting: Pharmacist

## 2023-01-28 ENCOUNTER — Other Ambulatory Visit (HOSPITAL_COMMUNITY): Payer: Self-pay

## 2023-01-28 DIAGNOSIS — E669 Obesity, unspecified: Secondary | ICD-10-CM

## 2023-01-28 DIAGNOSIS — I5022 Chronic systolic (congestive) heart failure: Secondary | ICD-10-CM

## 2023-01-28 MED ORDER — ZEPBOUND 5 MG/0.5ML ~~LOC~~ SOAJ
5.0000 mg | SUBCUTANEOUS | 0 refills | Status: DC
  Filled 2023-01-28: qty 2, 28d supply, fill #0

## 2023-01-28 NOTE — Addendum Note (Signed)
Addended by: Malena Peer D on: 01/28/2023 04:09 PM   Modules accepted: Orders

## 2023-01-29 ENCOUNTER — Other Ambulatory Visit (HOSPITAL_COMMUNITY): Payer: Self-pay

## 2023-01-29 ENCOUNTER — Other Ambulatory Visit: Payer: Self-pay

## 2023-01-30 ENCOUNTER — Encounter (HOSPITAL_COMMUNITY)
Admission: RE | Admit: 2023-01-30 | Discharge: 2023-01-30 | Disposition: A | Discharge status: Home / Self Care | Payer: BC Managed Care – PPO | Source: Ambulatory Visit | Attending: Internal Medicine | Admitting: Internal Medicine

## 2023-01-30 DIAGNOSIS — I5022 Chronic systolic (congestive) heart failure: Secondary | ICD-10-CM | POA: Diagnosis not present

## 2023-02-02 ENCOUNTER — Encounter (HOSPITAL_COMMUNITY)
Admission: RE | Admit: 2023-02-02 | Discharge: 2023-02-02 | Disposition: A | Discharge status: Home / Self Care | Payer: BC Managed Care – PPO | Source: Ambulatory Visit | Attending: Internal Medicine

## 2023-02-02 DIAGNOSIS — I5022 Chronic systolic (congestive) heart failure: Secondary | ICD-10-CM

## 2023-02-03 ENCOUNTER — Other Ambulatory Visit: Payer: Self-pay | Admitting: Family Medicine

## 2023-02-03 ENCOUNTER — Other Ambulatory Visit (HOSPITAL_BASED_OUTPATIENT_CLINIC_OR_DEPARTMENT_OTHER): Payer: Self-pay | Admitting: Internal Medicine

## 2023-02-04 ENCOUNTER — Other Ambulatory Visit (HOSPITAL_COMMUNITY): Payer: Self-pay

## 2023-02-04 ENCOUNTER — Other Ambulatory Visit: Payer: Self-pay

## 2023-02-04 ENCOUNTER — Encounter (HOSPITAL_COMMUNITY)
Admission: RE | Admit: 2023-02-04 | Discharge: 2023-02-04 | Disposition: A | Discharge status: Home / Self Care | Payer: BC Managed Care – PPO | Source: Ambulatory Visit | Attending: Internal Medicine | Admitting: Internal Medicine

## 2023-02-04 DIAGNOSIS — I5022 Chronic systolic (congestive) heart failure: Secondary | ICD-10-CM | POA: Diagnosis not present

## 2023-02-06 ENCOUNTER — Encounter (HOSPITAL_COMMUNITY): Payer: BC Managed Care – PPO

## 2023-02-09 ENCOUNTER — Other Ambulatory Visit (HOSPITAL_BASED_OUTPATIENT_CLINIC_OR_DEPARTMENT_OTHER): Payer: Self-pay | Admitting: Internal Medicine

## 2023-02-09 ENCOUNTER — Other Ambulatory Visit: Payer: Self-pay | Admitting: Pharmacist Clinician (PhC)/ Clinical Pharmacy Specialist

## 2023-02-09 ENCOUNTER — Other Ambulatory Visit (HOSPITAL_COMMUNITY): Payer: Self-pay

## 2023-02-09 ENCOUNTER — Encounter (HOSPITAL_COMMUNITY)
Admission: RE | Admit: 2023-02-09 | Discharge: 2023-02-09 | Disposition: A | Discharge status: Home / Self Care | Payer: BC Managed Care – PPO | Source: Ambulatory Visit | Attending: Internal Medicine | Admitting: Internal Medicine

## 2023-02-09 DIAGNOSIS — I5022 Chronic systolic (congestive) heart failure: Secondary | ICD-10-CM | POA: Insufficient documentation

## 2023-02-09 MED ORDER — ZEPBOUND 7.5 MG/0.5ML ~~LOC~~ SOAJ
7.5000 mg | SUBCUTANEOUS | 0 refills | Status: DC
  Filled 2023-02-09 – 2023-02-20 (×2): qty 2, 28d supply, fill #0

## 2023-02-09 NOTE — Telephone Encounter (Signed)
Spoke with patient - he has done well with the 5 mg dose, no n/v.  Some weight loss, but just finished Thanksgiving holiday, so not as much as would have liked.    Will increase to 7.5 mg dose

## 2023-02-10 ENCOUNTER — Other Ambulatory Visit (HOSPITAL_COMMUNITY): Payer: Self-pay

## 2023-02-10 ENCOUNTER — Ambulatory Visit (INDEPENDENT_AMBULATORY_CARE_PROVIDER_SITE_OTHER): Payer: BC Managed Care – PPO | Admitting: Family Medicine

## 2023-02-10 ENCOUNTER — Encounter: Payer: Self-pay | Admitting: Family Medicine

## 2023-02-10 VITALS — BP 108/74 | HR 90 | Temp 98.2°F | Ht 77.0 in | Wt 298.8 lb

## 2023-02-10 DIAGNOSIS — E1159 Type 2 diabetes mellitus with other circulatory complications: Secondary | ICD-10-CM | POA: Insufficient documentation

## 2023-02-10 DIAGNOSIS — Z7985 Long-term (current) use of injectable non-insulin antidiabetic drugs: Secondary | ICD-10-CM | POA: Diagnosis not present

## 2023-02-10 DIAGNOSIS — D751 Secondary polycythemia: Secondary | ICD-10-CM | POA: Diagnosis not present

## 2023-02-10 DIAGNOSIS — R739 Hyperglycemia, unspecified: Secondary | ICD-10-CM

## 2023-02-10 DIAGNOSIS — R7989 Other specified abnormal findings of blood chemistry: Secondary | ICD-10-CM

## 2023-02-10 DIAGNOSIS — Z7984 Long term (current) use of oral hypoglycemic drugs: Secondary | ICD-10-CM | POA: Diagnosis not present

## 2023-02-10 LAB — POCT GLYCOSYLATED HEMOGLOBIN (HGB A1C): Hemoglobin A1C: 6.5 % — AB (ref 4.0–5.6)

## 2023-02-10 NOTE — Patient Instructions (Signed)
A1C is 6.5%  Set up labs for next Tuesday  Lets plan on 3 month follow up.

## 2023-02-10 NOTE — Progress Notes (Signed)
Established Patient Office Visit  Subjective   Patient ID: Harold Park, male    DOB: Jul 26, 1965  Age: 57 y.o. MRN: [id number]  Chief Complaint  Patient presents with   Medical Management of Chronic Issues    HPI   Harold Park seen for 19-month follow-up.  Past medical history significant for chronic systolic heart failure, history of A-fib, chronic anticoagulation, obstructive sleep apnea, obesity, testosterone, reported sarcoidosis, and hyperglycemia.  Refer to prior note for detail.  Had recent admission most the month of August with severe heart failure and atrial fibrillation.  Had recent ablation procedure October 9 and presumably has been in sinus rhythm since then.  Feels much better overall.  Undergoing cardiac rehab 3 times per week and progressing with stamina and strength.  Weight is up a bit from last visit but no peripheral edema.  He thinks some of this is muscular.  Recently placed per cardiology on Zepbound 5 mg daily.  Last A1c 6.4%.  Also takes [place].  Not monitoring blood sugars regularly.  Strong family history of type 2 diabetes.  History of low testosterone.  Felt better with more stamina on replacement but has had issues with polycythemia.  Has been off testosterone for months.  At 1 point he was getting phlebotomy through [facility].  He is requesting follow-up testosterone level at this time.  Last PSA 10 months ago 1.69  Past Medical History:  Diagnosis Date   Chronic bronchitis    HTN (hypertension)    NICM (nonischemic cardiomyopathy) (HCC)    Obesity (BMI 30-39.9) 12/04/2015   OSA (obstructive sleep apnea) 08/19/2015   Severe with AHI 64/hr now on CPAP at 13cm H2O   PAF (paroxysmal atrial fibrillation) (HCC)    Systolic CHF, chronic (HCC)    Past Surgical History:  Procedure Laterality Date   APPENDECTOMY     ATRIAL FIBRILLATION ABLATION N/A 12/17/2022   Procedure: ATRIAL FIBRILLATION ABLATION;  Surgeon: Maurice Small, MD;  Location: [facility];  Service: Cardiovascular;  Laterality: N/A;   BRONCHIAL WASHINGS  04/24/2020   Procedure: BRONCHIAL WASHINGS;  Surgeon: Leslye Peer, MD;  Location: [facility] ENDOSCOPY;  Service: Pulmonary;;   CARDIAC CATHETERIZATION     CARDIOVERSION N/A 06/19/2017   Procedure: CARDIOVERSION;  Surgeon: Dolores Patty, MD;  Location: [facility] ENDOSCOPY;  Service: Cardiovascular;  Laterality: N/A;   CARDIOVERSION N/A 10/02/2022   Procedure: CARDIOVERSION;  Surgeon: Dolores Patty, MD;  Location: [facility];  Service: Cardiovascular;  Laterality: N/A;   CARDIOVERSION N/A 10/20/2022   Procedure: CARDIOVERSION;  Surgeon: Dolores Patty, MD;  Location: [facility];  Service: Cardiovascular;  Laterality: N/A;   CARDIOVERSION N/A 10/23/2022   Procedure: CARDIOVERSION;  Surgeon: Dolores Patty, MD;  Location: [facility];  Service: Cardiovascular;  Laterality: N/A;   CARDIOVERSION N/A 11/13/2022   Procedure: CARDIOVERSION;  Surgeon: Dolores Patty, MD;  Location: [facility];  Service: Cardiovascular;  Laterality: N/A;   FINE NEEDLE ASPIRATION  04/24/2020   Procedure: FINE NEEDLE ASPIRATION (FNA) LINEAR;  Surgeon: Leslye Peer, MD;  Location: [facility] ENDOSCOPY;  Service: Pulmonary;;   LIPOMA RESECTION     arms   RIGHT HEART CATH N/A 10/30/2022   Procedure: RIGHT HEART CATH;  Surgeon: Dorthula Nettles, DO;  Location: [facility];  Service: Cardiovascular;  Laterality: N/A;   SPLENECTOMY     SPLIT NIGHT STUDY  08/12/2015   VIDEO BRONCHOSCOPY WITH ENDOBRONCHIAL ULTRASOUND N/A 04/24/2020  Procedure: VIDEO BRONCHOSCOPY WITH ENDOBRONCHIAL ULTRASOUND;  Surgeon: Leslye Peer, MD;  Location: [facility];  Service: Pulmonary;  Laterality: N/A;    reports that he has never smoked. He has never used smokeless tobacco. He reports current alcohol use. He reports that he does not use drugs. family history includes Breast cancer in his mother; Cancer in his father; Heart disease in  an other family member; Hypertension in an other family member. Allergies  Allergen Reactions   Bee Venom Anaphylaxis    Review of Systems  Constitutional:  Negative for fever and malaise/fatigue.  Eyes:  Negative for blurred vision.  Respiratory:  Negative for cough and shortness of breath.   Cardiovascular:  Negative for chest pain and leg swelling.  Neurological:  Negative for dizziness, weakness and headaches.      Objective:     BP 108/74 (BP Location: Left Arm, Patient Position: Sitting, Cuff Size: Large)   Pulse 90   Temp 98.2 F (36.8 C) (Oral)   Ht 6\' 5"  (1.956 m)   Wt 298 lb 12.8 oz (135.5 kg)   SpO2 97%   BMI 35.43 kg/m  BP Readings from Last 3 Encounters:  02/10/23 108/74  01/20/23 110/76  01/13/23 132/84   Wt Readings from Last 3 Encounters:  02/10/23 298 lb 12.8 oz (135.5 kg)  01/20/23 (!) 301 lb 3.2 oz (136.6 kg)  01/13/23 (!) 301 lb 3.2 oz (136.6 kg)      Physical Exam Vitals reviewed.  Constitutional:      General: He is not in acute distress.    Appearance: He is not ill-appearing.  Cardiovascular:     Rate and Rhythm: Normal rate and regular rhythm.  Pulmonary:     Effort: Pulmonary effort is normal.     Breath sounds: Normal breath sounds. No wheezing or rales.  Musculoskeletal:     Right lower leg: No edema.     Left lower leg: No edema.  Neurological:     Mental Status: He is alert.      Results for orders placed or performed in visit on 02/10/23  POC HgB A1c  Result Value Ref Range   Hemoglobin A1C 6.5 (A) 4.0 - 5.6 %   HbA1c POC (<> result, manual entry)     HbA1c, POC (prediabetic range)     HbA1c, POC (controlled diabetic range)      Last CBC Lab Results  Component Value Date   WBC 10.2 11/27/2022   HGB 19.7 (H) 11/27/2022   HCT 60.7 (H) 11/27/2022   MCV 89 11/27/2022   MCH 29.0 11/27/2022   RDW 17.5 (H) 11/27/2022   PLT 327 11/27/2022   Last metabolic panel Lab Results  Component Value Date   GLUCOSE 103 (H)  01/13/2023   NA 138 01/13/2023   K 4.1 01/13/2023   CL 101 01/13/2023   CO2 28 01/13/2023   BUN 21 (H) 01/13/2023   CREATININE 1.44 (H) 01/13/2023   GFRNONAA 56 (L) 01/13/2023   CALCIUM 9.2 01/13/2023   PROT 6.8 12/16/2022   ALBUMIN 3.3 (L) 12/16/2022   BILITOT 0.4 12/16/2022   ALKPHOS 59 12/16/2022   AST 22 12/16/2022   ALT 22 12/16/2022   ANIONGAP 9 01/13/2023   Last lipids Lab Results  Component Value Date   CHOL 158 02/02/2019   HDL 28 (L) 02/02/2019   LDLCALC 75 02/02/2019   TRIG 275 (H) 02/02/2019   CHOLHDL 5.6 02/02/2019   Last hemoglobin A1c Lab Results  Component Value Date  HGBA1C 6.5 (A) 02/10/2023   Last thyroid functions Lab Results  Component Value Date   TSH 3.058 12/16/2022      The ASCVD Risk score (Arnett DK, et al., 2019) failed to calculate for the following reasons:   Cannot find a previous HDL lab   Cannot find a previous total cholesterol lab    Assessment & Plan:   #1 hyperglycemia.  A1c today 6.5% which is somewhat surprisingly up slightly from 6.4 three months ago and has had recent initiation of Zepbound and remains on [place].  He we will likely have further titration of Zepbound per cardiology and we recommended continuing current regimen and reassess A1c in 3 months.  Continue low glycemic diet.  Suspect this will continue to decline as he has only been on Zepbound now for 2 months and only 1 month of 5 mg dose  #2 history of nonischemic cardiomyopathy with chronic systolic heart failure and atrial fibrillation.  Recent ablation procedure and appears to be in sinus rhythm at this time.  No evidence for volume overload at this time.  Continue close follow-up with cardiology heart failure clinic and cardiac rehab.  Symptomatically much improved compared with 3 months ago  #3 history of low testosterone.  Patient requesting follow-up level.  Suspect this will be low.  He has been off replacement for many months.  His treatment has been  complicated previously by polycythemia.  Will recheck CBC also.  We explained that we cannot treat him especially for hematocrit over 55 and certainly would not recommend any treatment unless cardiology feels like he has no contraindications   Return in about 3 months (around 05/11/2023).    Evelena Peat, MD

## 2023-02-11 ENCOUNTER — Encounter (HOSPITAL_COMMUNITY)
Admission: RE | Admit: 2023-02-11 | Discharge: 2023-02-11 | Disposition: A | Discharge status: Home / Self Care | Payer: BC Managed Care – PPO | Source: Ambulatory Visit | Attending: Internal Medicine

## 2023-02-11 DIAGNOSIS — I5022 Chronic systolic (congestive) heart failure: Secondary | ICD-10-CM | POA: Diagnosis not present

## 2023-02-12 NOTE — Progress Notes (Signed)
Reviewed home exercise Rx with patient today.  Encouraged warm-up, cool-down, and stretching. Reviewed THRR of  65- 130 and keeping RPE between 11-13. Encouraged to hydrate with activity.  Reviewed weather parameters for temperature and humidity for safe exercise outdoors. Reviewed S/S to terminate exercise and when to call 911 vs MD. Pt encouraged to always carry a cell phone for safety when exercising outdoors. Pt verbalized understanding of the home exercise Rx and was provided a copy.  Lesly Rubenstein MS, ACSM-CEP, CCRP

## 2023-02-13 ENCOUNTER — Encounter (HOSPITAL_COMMUNITY)
Admission: RE | Admit: 2023-02-13 | Discharge: 2023-02-13 | Disposition: A | Discharge status: Home / Self Care | Payer: BC Managed Care – PPO | Source: Ambulatory Visit | Attending: Internal Medicine | Admitting: Internal Medicine

## 2023-02-13 ENCOUNTER — Ambulatory Visit: Payer: BC Managed Care – PPO | Admitting: Family Medicine

## 2023-02-13 DIAGNOSIS — I5022 Chronic systolic (congestive) heart failure: Secondary | ICD-10-CM

## 2023-02-15 ENCOUNTER — Other Ambulatory Visit: Payer: Self-pay | Admitting: Family Medicine

## 2023-02-16 ENCOUNTER — Encounter (HOSPITAL_COMMUNITY)
Admission: RE | Admit: 2023-02-16 | Discharge: 2023-02-16 | Disposition: A | Discharge status: Home / Self Care | Payer: BC Managed Care – PPO | Source: Ambulatory Visit | Attending: Internal Medicine | Admitting: Internal Medicine

## 2023-02-16 ENCOUNTER — Other Ambulatory Visit: Payer: Self-pay

## 2023-02-16 DIAGNOSIS — I5022 Chronic systolic (congestive) heart failure: Secondary | ICD-10-CM | POA: Diagnosis not present

## 2023-02-17 ENCOUNTER — Other Ambulatory Visit: Payer: BC Managed Care – PPO

## 2023-02-18 ENCOUNTER — Encounter (HOSPITAL_COMMUNITY)
Admission: RE | Admit: 2023-02-18 | Discharge: 2023-02-18 | Disposition: A | Discharge status: Home / Self Care | Payer: BC Managed Care – PPO | Source: Ambulatory Visit | Attending: Internal Medicine | Admitting: Internal Medicine

## 2023-02-18 ENCOUNTER — Other Ambulatory Visit (HOSPITAL_COMMUNITY): Payer: Self-pay

## 2023-02-18 DIAGNOSIS — I5022 Chronic systolic (congestive) heart failure: Secondary | ICD-10-CM

## 2023-02-18 MED ORDER — TORSEMIDE 20 MG PO TABS
40.0000 mg | ORAL_TABLET | Freq: Two times a day (BID) | ORAL | 0 refills | Status: DC
  Filled 2023-02-18: qty 120, 30d supply, fill #0
  Filled 2023-03-06 – 2023-05-11 (×2): qty 120, 30d supply, fill #1
  Filled 2023-07-07: qty 120, 30d supply, fill #2

## 2023-02-19 NOTE — Progress Notes (Signed)
Cardiac Individual Treatment Plan  Patient Details  Name: Harold Park MRN: [id number] Date of Birth: 1965-05-15 Referring Provider:   Flowsheet Row INTENSIVE CARDIAC REHAB ORIENT from 12/04/2022 in [facility]  Referring Provider Arvilla Meres, MD       Initial Encounter Date:  Flowsheet Row INTENSIVE CARDIAC REHAB ORIENT from 12/04/2022 in [facility] for [facility], [facility]  Date 12/04/22       Visit Diagnosis: Heart failure, chronic systolic (HCC)  Patient's Home Medications on Admission:  Current Outpatient Medications:    albuterol (VENTOLIN HFA) 108 (90 Base) MCG/ACT inhaler, TAKE 2 PUFFS BY MOUTH EVERY 6 HOURS AS NEEDED FOR WHEEZE OR SHORTNESS OF BREATH, Disp: 8.5 each, Rfl: 2   amiodarone (PACERONE) 200 MG tablet, Take 1 tablet (200 mg total) by mouth daily., Disp: 30 tablet, Rfl: 3   digoxin (LANOXIN) 0.125 MG tablet, Take 1 tablet (0.125 mg total) by mouth daily., Disp: 30 tablet, Rfl: 5   DULoxetine (CYMBALTA) 20 MG capsule, TAKE 1 CAPSULE BY MOUTH EVERY DAY, Disp: 90 capsule, Rfl: 3   ELIQUIS 5 MG TABS tablet, TAKE 1 TABLET BY MOUTH TWICE A DAY, Disp: 60 tablet, Rfl: 6   empagliflozin (JARDIANCE) 10 MG TABS tablet, Take 1 tablet (10 mg total) by mouth daily., Disp: 30 tablet, Rfl: 5   Magnesium 250 MG TABS, Take 250 mg by mouth every evening. Gummy, Disp: , Rfl:    ondansetron (ZOFRAN) 4 MG tablet, Take 1 tablet (4 mg total) by mouth every 8 (eight) hours as needed for nausea or vomiting., Disp: 60 tablet, Rfl: 1   potassium chloride SA (KLOR-CON M) 20 MEQ tablet, Take 2 tablets (40 mEq total) by mouth 2 (two) times daily., Disp: 360 tablet, Rfl: 3   spironolactone (ALDACTONE) 25 MG tablet, Take 1 tablet (25 mg total) by mouth daily., Disp: 30 tablet, Rfl: 5   tirzepatide (ZEPBOUND) 7.5 MG/0.5ML Pen, Inject 7.5 mg into the skin once a week. After completing 5 mg Rx, Disp: 2  mL, Rfl: 0   torsemide (DEMADEX) 20 MG tablet, Take 2 tablets (40 mg total) by mouth 2 (two) times daily., Disp: 360 tablet, Rfl: 0  Past Medical History: Past Medical History:  Diagnosis Date   Chronic bronchitis    HTN (hypertension)    NICM (nonischemic cardiomyopathy) (HCC)    Obesity (BMI 30-39.9) 12/04/2015   OSA (obstructive sleep apnea) 08/19/2015   Severe with AHI 64/hr now on CPAP at 13cm H2O   PAF (paroxysmal atrial fibrillation) (HCC)    Systolic CHF, chronic (HCC)     Tobacco Use: Social History   Tobacco Use  Smoking Status Never  Smokeless Tobacco Never    Labs: Review Flowsheet  More data exists      Latest Ref Rng & Units 11/06/2022 11/07/2022 11/08/2022 11/13/2022 02/10/2023  Labs for ITP Cardiac and Pulmonary Rehab  Hemoglobin A1c 4.0 - 5.6 % - - - - 6.5   TCO2 22 - 32 mmol/L - - - 23  -  O2 Saturation % 80.9  68.9  84.7  - -    Capillary Blood Glucose: Lab Results  Component Value Date   GLUCAP 109 (H) 12/08/2022   GLUCAP 107 (H) 12/08/2022   GLUCAP 114 (H) 11/08/2022   GLUCAP 69 (L) 11/08/2022   GLUCAP 73 11/08/2022     Exercise Target Goals: Exercise Program Goal: Individual exercise prescription set using results from initial  6 min walk test and THRR while considering  patient's activity barriers and safety.   Exercise Prescription Goal: Initial exercise prescription builds to 30-45 minutes a day of aerobic activity, 2-3 days per week.  Home exercise guidelines will be given to patient during program as part of exercise prescription that the participant will acknowledge.  Activity Barriers & Risk Stratification:  Activity Barriers & Cardiac Risk Stratification - 12/04/22 1415       Activity Barriers & Cardiac Risk Stratification   Activity Barriers Decreased Ventricular Function    Cardiac Risk Stratification High   <5 METs on            6 Minute Walk:  6 Minute Walk     Row Name 12/04/22 1511         6 Minute Walk    Phase Initial     Distance 1490 feet     Walk Time 6 minutes     # of Rest Breaks 0     MPH 2.82     METS 3.56     RPE 11     Perceived Dyspnea  0     VO2 Peak 11.75     Symptoms No     Resting HR 87 bpm     Resting BP 100/68     Resting Oxygen Saturation  98 %     Exercise Oxygen Saturation  during 6 min walk 95 %     Max Ex. HR 109 bpm     Max Ex. BP 122/78     2 Minute Post BP 114/86              Oxygen Initial Assessment:   Oxygen Re-Evaluation:   Oxygen Discharge (Final Oxygen Re-Evaluation):   Initial Exercise Prescription:  Initial Exercise Prescription - 12/04/22 1500       Date of Initial Exercise RX and Referring Provider   Date 12/04/22    Referring Provider Arvilla Meres, MD    Expected Discharge Date 02/25/23      Treadmill   MPH 2    Grade 0    Minutes 15    METs 3      Bike   Level 1.5    Watts 60    Minutes 15    METs 3      Prescription Details   Frequency (times per week) 3    Duration Progress to 30 minutes of continuous aerobic without signs/symptoms of physical distress      Intensity   THRR 40-80% of Max Heartrate 65-130    Ratings of Perceived Exertion 11-13    Perceived Dyspnea 0-4      Progression   Progression Continue progressive overload as per policy without signs/symptoms or physical distress.      Resistance Training   Training Prescription Yes    Weight 4    Reps 10-15             Perform Capillary Blood Glucose checks as needed.  Exercise Prescription Changes:   Exercise Prescription Changes     Row Name 12/08/22 1250 12/29/22 1400 01/07/23 1400 02/02/23 1500 02/03/23 1100     Response to Exercise   Blood Pressure (Admit) 118/88 114/78 104/80 122/84 --   Blood Pressure (Exercise) 122/88 138/84 -- -- --   Blood Pressure (Exit) 122/78 108/78 110/76 104/74 --   Heart Rate (Admit) 98 bpm 85 bpm 89 bpm 91 bpm --   Heart Rate (Exercise) 112 bpm 114 bpm  117 bpm 120 bpm --   Heart Rate (Exit) 104  bpm 97 bpm 97 bpm 100 bpm --   Rating of Perceived Exertion (Exercise) 12 11 11 11  --   Symptoms None None None None --   Comments Off to a good start with exercise. Reviewed METs Reviewed METs and goals Reviewed METs and goals --   Duration Continue with 30 min of aerobic exercise without signs/symptoms of physical distress. Continue with 30 min of aerobic exercise without signs/symptoms of physical distress. Continue with 30 min of aerobic exercise without signs/symptoms of physical distress. Continue with 30 min of aerobic exercise without signs/symptoms of physical distress. --   Intensity THRR unchanged THRR unchanged THRR unchanged THRR unchanged --     Progression   Progression Continue to progress workloads to maintain intensity without signs/symptoms of physical distress. Continue to progress workloads to maintain intensity without signs/symptoms of physical distress. Continue to progress workloads to maintain intensity without signs/symptoms of physical distress. Continue to progress workloads to maintain intensity without signs/symptoms of physical distress. --   Average METs 2.8 3.05 3.3 4.35 --     Resistance Training   Training Prescription Yes Yes No Yes --   Weight 4 4 No weights on Wednesdays 5 lbs --   Reps 10-15 10-15 -- 10-15 --   Time 10 Minutes 10 Minutes -- 10 Minutes --     Interval Training   Interval Training No No No No --     Treadmill   MPH 2 2.5 2.8 2.8 --   Grade 0 0 1 2.5 --   Minutes 15 15 15 15  --   METs 2.53 2.91 3.53 4.11 --     Bike   Level 2 2.8 3.5 4.5 --   Watts 46 -- 59 108 --   Minutes 15 15 15 15  --   METs 3 3.2 3.3 4.5 --    Row Name 02/11/23 1500             Response to Exercise   Blood Pressure (Admit) 112/80       Blood Pressure (Exit) 106/70       Heart Rate (Admit) 86 bpm       Heart Rate (Exercise) 134 bpm       Heart Rate (Exit) 100 bpm       Rating of Perceived Exertion (Exercise) 13       Symptoms None       Comments  Reviewed Home exercise Rx       Duration Continue with 30 min of aerobic exercise without signs/symptoms of physical distress.       Intensity THRR unchanged         Progression   Progression Continue to progress workloads to maintain intensity without signs/symptoms of physical distress.       Average METs 4.4         Resistance Training   Training Prescription No       Weight No weights onWednesdays         Interval Training   Interval Training No         Treadmill   MPH 2.8       Grade 3       Minutes 15       METs 4.3         Bike   Level 5       Minutes 15       METs 4.5  Home Exercise Plan   Plans to continue exercise at Home (comment)       Frequency Add 3 additional days to program exercise sessions.       Initial Home Exercises Provided 02/11/23                Exercise Comments:   Exercise Comments     Row Name 12/08/22 1345 12/24/22 1525 12/29/22 1437 01/07/23 1500 02/02/23 1500   Exercise Comments Patient tolerated first session of exercise well without symptoms. Introduced to exercise and stretching routine. Pt was due for MET review today but is out after cardioversion. Will complete upon patient return. Reviewed METs. Pt making good progress. Pt increased on both modalities today without complaints. Reviewed METs and goals. Pt is making progress. Pt was able to increase workload on treadmill today and will increase workload on bike next session. Reviewed METs and goals. Pt is making progress. Pt reports walking 3-4x/week on his days off fo 30 minutes.    Row Name 02/03/23 1202 02/11/23 1500         Exercise Comments -- Reviewed Home exercise Rx. Pt is walking at home 30 minutes 2-3x/week in addtion to playing golf. Pt will continue this as he is at goal for 150 minutes per week of aerobic exercise. Pt verbalized understanding of the home exercise Rx and was provied a copy.               Exercise Goals and Review:   Exercise Goals      Row Name 12/04/22 1346             Exercise Goals   Increase Physical Activity Yes       Intervention Provide advice, education, support and counseling about physical activity/exercise needs.;Develop an individualized exercise prescription for aerobic and resistive training based on initial evaluation findings, risk stratification, comorbidities and participant's personal goals.       Expected Outcomes Short Term: Attend rehab on a regular basis to increase amount of physical activity.;Long Term: Exercising regularly at least 3-5 days a week.;Long Term: Add in home exercise to make exercise part of routine and to increase amount of physical activity.       Increase Strength and Stamina Yes       Intervention Provide advice, education, support and counseling about physical activity/exercise needs.;Develop an individualized exercise prescription for aerobic and resistive training based on initial evaluation findings, risk stratification, comorbidities and participant's personal goals.       Expected Outcomes Short Term: Increase workloads from initial exercise prescription for resistance, speed, and METs.;Short Term: Perform resistance training exercises routinely during rehab and add in resistance training at home;Long Term: Improve cardiorespiratory fitness, muscular endurance and strength as measured by increased METs and functional capacity ( )       Able to understand and use rate of perceived exertion (RPE) scale Yes       Intervention Provide education and explanation on how to use RPE scale       Expected Outcomes Short Term: Able to use RPE daily in rehab to express subjective intensity level;Long Term:  Able to use RPE to guide intensity level when exercising independently       Knowledge and understanding of Target Heart Rate Range (THRR) Yes       Intervention Provide education and explanation of THRR including how the numbers were predicted and where they are located for reference        Expected Outcomes Short Term: Able  to state/look up THRR;Short Term: Able to use daily as guideline for intensity in rehab;Long Term: Able to use THRR to govern intensity when exercising independently       Understanding of Exercise Prescription Yes       Intervention Provide education, explanation, and written materials on patient's individual exercise prescription       Expected Outcomes Short Term: Able to explain program exercise prescription;Long Term: Able to explain home exercise prescription to exercise independently                Exercise Goals Re-Evaluation :  Exercise Goals Re-Evaluation     Row Name 12/08/22 1345 01/07/23 1500 01/08/23 0850 02/02/23 1500       Exercise Goal Re-Evaluation   Exercise Goals Review Increase Physical Activity;Increase Strength and Stamina;Able to understand and use rate of perceived exertion (RPE) scale Increase Physical Activity;Understanding of Exercise Prescription;Increase Strength and Stamina;Knowledge and understanding of Target Heart Rate Range (THRR);Able to understand and use rate of perceived exertion (RPE) scale -- Increase Physical Activity;Understanding of Exercise Prescription;Increase Strength and Stamina;Knowledge and understanding of Target Heart Rate Range (THRR);Able to understand and use rate of perceived exertion (RPE) scale    Comments Patient able to understand and use RPE  scale appropriately. Reviewed METs and goals. Peak METs to date are 3.9. Pt feeels he is making progress on his goal on imrpoving strength and stamina. Pt see imrpovement in musle tone in his legs, also a goal. Pt has not gotten back to playing golf yet but plans to try ssoon. Pt alos has goal of an imrpoved EF. Pt echo coming up next week. -- Reviewed METs and goals. Peak METs to date are 6.0. Pt feeels he is making progress on his goal on imrpoving strength and stamina. Pt continues to see imrpovement in musle tone in his legs, also a goal.    Expected  Outcomes Progress workloads as tolerated to increase cardiorespiratory fitness. Will continue to montior patient and progress exericse workloads as tolerated. -- Will continue to montior patient and progress exericse workloads as tolerated.             Discharge Exercise Prescription (Final Exercise Prescription Changes):  Exercise Prescription Changes - 02/11/23 1500       Response to Exercise   Blood Pressure (Admit) 112/80    Blood Pressure (Exit) 106/70    Heart Rate (Admit) 86 bpm    Heart Rate (Exercise) 134 bpm    Heart Rate (Exit) 100 bpm    Rating of Perceived Exertion (Exercise) 13    Symptoms None    Comments Reviewed Home exercise Rx    Duration Continue with 30 min of aerobic exercise without signs/symptoms of physical distress.    Intensity THRR unchanged      Progression   Progression Continue to progress workloads to maintain intensity without signs/symptoms of physical distress.    Average METs 4.4      Resistance Training   Training Prescription No    Weight No weights onWednesdays      Interval Training   Interval Training No      Treadmill   MPH 2.8    Grade 3    Minutes 15    METs 4.3      Bike   Level 5    Minutes 15    METs 4.5      Home Exercise Plan   Plans to continue exercise at Home (comment)    Frequency Add 3 additional  days to program exercise sessions.    Initial Home Exercises Provided 02/11/23             Nutrition:  Target Goals: Understanding of nutrition guidelines, daily intake of sodium 1500mg , cholesterol 200mg , calories 30% from fat and 7% or less from saturated fats, daily to have 5 or more servings of fruits and vegetables.  Biometrics:  Pre Biometrics - 12/04/22 1319       Pre Biometrics   Waist Circumference 51.5 inches    Hip Circumference 47 inches    Waist to Hip Ratio 1.1 %    Triceps Skinfold 28 mm    % Body Fat 37.7 %    Grip Strength 54 kg    Flexibility 11.5 in    Single Leg Stand 14.68  seconds              Nutrition Therapy Plan and Nutrition Goals:  Nutrition Therapy & Goals - 01/09/23 1358       Nutrition Therapy   Diet Heart Healthy Diet      Personal Nutrition Goals   Nutrition Goal Patient to identify strategies for reducing cardiovascular risk by attending the [facility] education and nutrition series weekly.   goal in progress.   Personal Goal #2 Patient to improve diet quality by using the plate method as a guide for meal planning to include lean protein/plant protein, fruits, vegetables, whole grains, nonfat dairy as part of a well-balanced diet.   goal in progress.   Personal Goal #3 Patient to reduce sodium intake to 1500mg  per day   goal in progress.   Personal Goal #4 Patient to identify strategies for weight loss of 0.5-2.0# per week.    Comments Goals in progress. Harold Park has medical history HTN, CHF, Afib, OSA. He continues to attend the [facility] education and nutrition series regularly. He has starting making some dietary changes including reduced sodium intake and increased dietary fiber.  His A1c remains in a prediabetic; he is taking zepbound, jardiance. He reports improved snacking behaviors since re-starting zepbound. He is up 4.6# since starting with our program. We discussed multiple strategies for weight loss including the plate method, protein supplements, tracking intake, high protein/high fiber snacks, mindful eating, etc. Patient will benefit from participation in intensive cardiac rehab for nutrition, exercise, and lifestyle modification.      Intervention Plan   Intervention Prescribe, educate and counsel regarding individualized specific dietary modifications aiming towards targeted core components such as weight, hypertension, lipid management, diabetes, heart failure and other comorbidities.;Nutrition handout(s) given to patient.    Expected Outcomes Short Term Goal: Understand basic principles of dietary content, such as calories, fat,  sodium, cholesterol and nutrients.;Long Term Goal: Adherence to prescribed nutrition plan.;Short Term Goal: A plan has been developed with personal nutrition goals set during dietitian appointment.             Nutrition Assessments:  Nutrition Assessments - 12/08/22 1549       Rate Your Plate Scores   Pre Score 55            MEDIFICTS Score Key: >=70 Need to make dietary changes  40-70 Heart Healthy Diet <= 40 Therapeutic Level Cholesterol Diet   Flowsheet Row INTENSIVE CARDIAC REHAB from 02/09/2023 in [facility]  Picture Your Plate Total Score on Admission 55      Picture Your Plate Scores: <78 Unhealthy dietary pattern with much room for improvement. 41-50 Dietary pattern unlikely  to meet recommendations for good health and room for improvement. 51-60 More healthful dietary pattern, with some room for improvement.  >60 Healthy dietary pattern, although there may be some specific behaviors that could be improved.    Nutrition Goals Re-Evaluation:  Nutrition Goals Re-Evaluation     Row Name 12/10/22 1600 01/09/23 1358           Goals   Current Weight 293 lb 3.4 oz (133 kg) 297 lb 9.9 oz (135 kg)      Comment A1c 6.4; no lipid panel available. Cr 1.53, GFR 52; other most recent labs A1c 6.4; no lipid panel available.      Expected Outcome Harold Park has medical history HTN, CHF, Afib, OSA. He has starting making some dietary changes including reduced sodium intake. His A1c remains in a prediabetic; he is taking zepbound, jardiance. Patient will benefit from participation in intensive cardiac rehab for nutrition, exercise, and lifestyle modification. Goals in progress. Harold Park has medical history HTN, CHF, Afib, OSA. He continues to attend the [facility] education and nutrition series regularly. He has starting making some dietary changes including reduced sodium intake and increased dietary fiber. His A1c remains in a  prediabetic; he is taking zepbound, jardiance. He reports improved snacking behaviors since re-starting zepbound. He is up 4.6# since starting with our program. We discussed multiple strategies for weight loss including the plate method, protein supplements, tracking intake, high protein/high fiber snacks, mindful eating, etc. Patient will benefit from participation in intensive cardiac rehab for nutrition, exercise, and lifestyle modification.               Nutrition Goals Re-Evaluation:  Nutrition Goals Re-Evaluation     Row Name 12/10/22 1600 01/09/23 1358           Goals   Current Weight 293 lb 3.4 oz (133 kg) 297 lb 9.9 oz (135 kg)      Comment A1c 6.4; no lipid panel available. Cr 1.53, GFR 52; other most recent labs A1c 6.4; no lipid panel available.      Expected Outcome Harold Park has medical history HTN, CHF, Afib, OSA. He has starting making some dietary changes including reduced sodium intake. His A1c remains in a prediabetic; he is taking zepbound, jardiance. Patient will benefit from participation in intensive cardiac rehab for nutrition, exercise, and lifestyle modification. Goals in progress. Harold Park has medical history HTN, CHF, Afib, OSA. He continues to attend the [facility] education and nutrition series regularly. He has starting making some dietary changes including reduced sodium intake and increased dietary fiber. His A1c remains in a prediabetic; he is taking zepbound, jardiance. He reports improved snacking behaviors since re-starting zepbound. He is up 4.6# since starting with our program. We discussed multiple strategies for weight loss including the plate method, protein supplements, tracking intake, high protein/high fiber snacks, mindful eating, etc. Patient will benefit from participation in intensive cardiac rehab for nutrition, exercise, and lifestyle modification.               Nutrition Goals Discharge (Final Nutrition Goals Re-Evaluation):  Nutrition Goals  Re-Evaluation - 01/09/23 1358       Goals   Current Weight 297 lb 9.9 oz (135 kg)    Comment Cr 1.53, GFR 52; other most recent labs A1c 6.4; no lipid panel available.    Expected Outcome Goals in progress. Harold Park has medical history HTN, CHF, Afib, OSA. He continues to attend the [employer] education and nutrition series regularly. He has starting making some dietary changes  including reduced sodium intake and increased dietary fiber. His A1c remains in a prediabetic; he is taking zepbound, jardiance. He reports improved snacking behaviors since re-starting zepbound. He is up 4.6# since starting with our program. We discussed multiple strategies for weight loss including the plate method, protein supplements, tracking intake, high protein/high fiber snacks, mindful eating, etc. Patient will benefit from participation in intensive cardiac rehab for nutrition, exercise, and lifestyle modification.             Psychosocial: Target Goals: Acknowledge presence or absence of significant depression and/or stress, maximize coping skills, provide positive support system. Participant is able to verbalize types and ability to use techniques and skills needed for reducing stress and depression.  Initial Review & Psychosocial Screening:  Initial Psych Review & Screening - 12/04/22 1409       Initial Review   Current issues with Current Depression;Current Psychotropic Meds;Current Stress Concerns    Source of Stress Concerns Unable to participate in former interests or hobbies;Occupation;Financial;Family;Chronic Illness    Comments Harold Park shared that he has had some depression since his hospitalization and stress due to working with heart issues and the financial stress of that. No current feelings of anxiety. Harold Park feels his cymbalta somewhat works for him, denies any need for additional resources at this time.      Family Dynamics   Good Support System? Yes   Has wife for support     Barriers    Psychosocial barriers to participate in program The patient should benefit from training in stress management and relaxation.      Screening Interventions   Interventions To provide support and resources with identified psychosocial needs;Provide feedback about the scores to participant;Encouraged to exercise    Expected Outcomes Short Term goal: Utilizing psychosocial counselor, staff and physician to assist with identification of specific Stressors or current issues interfering with healing process. Setting desired goal for each stressor or current issue identified.;Long Term Goal: Stressors or current issues are controlled or eliminated.;Short Term goal: Identification and review with participant of any Quality of Life or Depression concerns found by scoring the questionnaire.;Long Term goal: The participant improves quality of Life and PHQ9 Scores as seen by post scores and/or verbalization of changes             Quality of Life Scores:  Quality of Life - 12/04/22 1423       Quality of Life   Select Quality of Life      Quality of Life Scores   Health/Function Pre 15.57 %    Socioeconomic Pre 26.43 %    Psych/Spiritual Pre 18.79 %    Family Pre 28.3 %    GLOBAL Pre 20.34 %            Scores of 19 and below usually indicate a poorer quality of life in these areas.  A difference of  2-3 points is a clinically meaningful difference.  A difference of 2-3 points in the total score of the Quality of Life Index has been associated with significant improvement in overall quality of life, self-image, physical symptoms, and general health in studies assessing change in quality of life.  PHQ-9: Review Flowsheet       12/04/2022 11/29/2021  Depression screen PHQ 2/9  Decreased Interest 1 1  Down, Depressed, Hopeless 1 0  PHQ - 2 Score 2 1  Altered sleeping 0 -  Tired, decreased energy 2 -  Change in appetite 0 -  Feeling bad or failure  about yourself  1 -  Trouble concentrating 0  -  Moving slowly or fidgety/restless 0 -  Suicidal thoughts 0 -  PHQ-9 Score 5 -  Difficult doing work/chores Somewhat difficult -   Interpretation of Total Score  Total Score Depression Severity:  1-4 = Minimal depression, 5-9 = Mild depression, 10-14 = Moderate depression, 15-19 = Moderately severe depression, 20-27 = Severe depression   Psychosocial Evaluation and Intervention:   Psychosocial Re-Evaluation:  Psychosocial Re-Evaluation     Row Name 12/10/22 0839 12/30/22 1418 01/27/23 1348 02/19/23 0805       Psychosocial Re-Evaluation   Current issues with Current Psychotropic Meds;Current Depression;Current Stress Concerns Current Psychotropic Meds;Current Depression;Current Stress Concerns Current Psychotropic Meds;Current Depression;Current Stress Concerns Current Psychotropic Meds;Current Depression;Current Stress Concerns    Comments No concerns voiced during first day of exercise at cardiac rehab. Will review quality of life questionaire in the upcoming week Reviewed quailityof life on 12/29/22. Harold Park says his depression and energy level has improved. 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 Harold Park says his depression and energy level has improved. Harold Park has not voiced any increased concerns or stressors during exercise at cardiac rehab. Harold Park has not voiced any increased concerns or stressors during exercise at cardiac rehab. Harold Park will complete cardiac rehab on 03/09/23.    Expected Outcomes Harold Park will have decreased or controlled depression/ stressors upon completion of cardiac rehab Harold Park will have decreased or controlled depression/ stressors upon completion of cardiac rehab Harold Park will have decreased or controlled depression/ stressors upon completion of cardiac rehab Harold Park will have decreased or controlled depression/ stressors upon completion of cardiac rehab    Interventions Stress management  education;Encouraged to attend Cardiac Rehabilitation for the exercise;Relaxation education Stress management education;Encouraged to attend Cardiac Rehabilitation for the exercise;Relaxation education Stress management education;Encouraged to attend Cardiac Rehabilitation for the exercise;Relaxation education Stress management education;Encouraged to attend Cardiac Rehabilitation for the exercise;Relaxation education    Continue Psychosocial Services  Follow up required by staff Follow up required by staff No Follow up required No Follow up required      Initial Review   Source of Stress Concerns Chronic Illness;Retirement/disability;Financial;Occupation Chronic Illness;Retirement/disability;Financial;Occupation Chronic Illness;Retirement/disability;Financial;Occupation Chronic Illness;Retirement/disability;Financial;Occupation    Comments Will continue to monitor and offer support as needed Will continue to monitor and offer support as needed Will continue to monitor and offer support as needed Will continue to monitor and offer support as needed             Psychosocial Discharge (Final Psychosocial Re-Evaluation):  Psychosocial Re-Evaluation - 02/19/23 0805       Psychosocial Re-Evaluation   Current issues with Current Psychotropic Meds;Current Depression;Current Stress Concerns    Comments Harold Park has not voiced any increased concerns or stressors during exercise at cardiac rehab. Harold Park will complete cardiac rehab on 03/09/23.    Expected Outcomes Harold Park will have decreased or controlled depression/ stressors upon completion of cardiac rehab    Interventions Stress management education;Encouraged to attend Cardiac Rehabilitation for the exercise;Relaxation education    Continue Psychosocial Services  No Follow up required      Initial Review   Source of Stress Concerns Chronic Illness;Retirement/disability;Financial;Occupation    Comments Will continue to monitor and offer support as needed              Vocational Rehabilitation: Provide vocational rehab assistance to qualifying candidates.   Vocational Rehab Evaluation & Intervention:  Vocational Rehab - 12/04/22 1412       Initial Vocational Rehab Evaluation & Intervention   Assessment shows need for Vocational Rehabilitation No   Harold Park is working            Education: Education Goals: Education classes will be provided on a weekly basis, covering required topics. Participant will state understanding/return demonstration of topics presented.    Education     Row Name 12/08/22 1600     Education   Cardiac Education Topics Pritikin   Glass blower/designer Nutrition   Nutrition Workshop Targeting Your Nutrition Priorities   Instruction Review Code 1- Verbalizes Understanding   Class Start Time 1400   Class Stop Time 1440   Class Time Calculation (min) 40 min    Row Name 12/12/22 1500     Education   Cardiac Education Topics Pritikin   Writer General Education   General Education Hypertension and Heart Disease   Instruction Review Code 1- Verbalizes Understanding   Class Start Time 1400   Class Stop Time 1440   Class Time Calculation (min) 40 min    Row Name 12/15/22 1500     Education   Cardiac Education Topics Pritikin   Licensed conveyancer Nutrition   Nutrition Dining Out - Part 1   Instruction Review Code 1- Verbalizes Understanding   Class Start Time 1400   Class Stop Time 1445   Class Time Calculation (min) 45 min    Row Name 12/29/22 1500     Education   Cardiac Education Topics Pritikin   Glass blower/designer Nutrition   Nutrition Workshop Fueling a Forensic psychologist   Instruction Review Code 1- Tax inspector   Class Start Time 1400   Class Stop Time 1445   Class Time Calculation  (min) 45 min    Row Name 12/31/22 1500     Education   Cardiac Education Topics Pritikin   Customer service manager   Weekly Topic International Cuisine- Spotlight on the [employer] Zones   Instruction Review Code 1- Verbalizes Understanding   Class Start Time 1400   Class Stop Time 1441   Class Time Calculation (min) 41 min    Row Name 01/02/23 1400     Education   Cardiac Education Topics Pritikin   Psychologist, forensic Exercise Education   Exercise Education Improving Performance   Instruction Review Code 1- Verbalizes Understanding   Class Start Time 1355   Class Stop Time 1433   Class Time Calculation (min) 38 min    Row Name 01/05/23 1600     Education   Cardiac Education Topics Pritikin   Geographical information systems officer Psychosocial   Psychosocial Workshop Healthy Sleep for a Healthy Heart   Instruction Review Code 1- Verbalizes Understanding   Class Start Time 1400   Class Stop Time 1450   Class Time Calculation (min) 50 min    Row Name 01/07/23 1500     Education   Cardiac Education Topics Pritikin   Select  Personnel officer   Instruction Review Code 1- Teaching laboratory technician Start Time 1400   Class Stop Time 1440   Class Time Calculation (min) 40 min    Row Name 01/09/23 1400     Education   Cardiac Education Topics Pritikin   Nurse, children's Exercise Physiologist   Select Psychosocial   Psychosocial How Our Thoughts Can Heal Our Hearts   Instruction Review Code 1- Verbalizes Understanding   Class Start Time 1355   Class Stop Time 1428   Class Time Calculation (min) 33 min    Row Name 01/14/23 1500     Education   Cardiac Education Topics Pritikin   Museum/gallery exhibitions officer   Weekly Topic Powerhouse Plant-Based Proteins   Instruction Review Code 1- Verbalizes Understanding   Class Start Time 1400   Class Stop Time 1438   Class Time Calculation (min) 38 min    Row Name 01/19/23 1600     Education   Cardiac Education Topics Pritikin     Workshops   Biomedical scientist Psychosocial   Psychosocial Workshop From Head to Heart: The Power of a Healthy Outlook   Instruction Review Code 1- Verbalizes Understanding   Class Start Time 1400   Class Stop Time 1450   Class Time Calculation (min) 50 min    Row Name 01/21/23 1500     Education   Cardiac Education Topics Pritikin   Customer service manager   Weekly Topic Adding Flavor - Sodium-Free   Instruction Review Code 1- Verbalizes Understanding   Class Start Time 1400   Class Stop Time 1440   Class Time Calculation (min) 40 min    Row Name 01/23/23 1500     Education   Cardiac Education Topics Pritikin   Hospital doctor Education   General Education Heart Disease Risk Reduction   Instruction Review Code 1- Verbalizes Understanding   Class Start Time 1400   Class Stop Time 1433   Class Time Calculation (min) 33 min    Row Name 01/26/23 1500     Education   Cardiac Education Topics Pritikin   Geographical information systems officer Exercise   Exercise Workshop Location manager and Fall Prevention   Instruction Review Code 1- Verbalizes Understanding   Class Start Time 1406   Class Stop Time 1500   Class Time Calculation (min) 54 min    Row Name 01/28/23 1300     Education   Cardiac Education Topics Pritikin   Customer service manager   Weekly Topic Fast and Healthy Breakfasts   Instruction Review Code 1- Verbalizes Understanding   Class Start Time 1355    Class Stop Time 1435   Class Time Calculation (min) 40 min    Row Name 01/30/23 1500     Education   Cardiac Education Topics Pritikin   Licensed conveyancer Nutrition   Nutrition Overview of the Pritikin Eating Plan  Instruction Review Code 1- Verbalizes Understanding   Class Start Time 1400   Class Stop Time 1445   Class Time Calculation (min) 45 min    Row Name 02/02/23 1400     Education   Cardiac Education Topics Pritikin   Nurse, children's Exercise Physiologist   Select Psychosocial   Psychosocial Healthy Minds, Bodies, Hearts   Instruction Review Code 1- Verbalizes Understanding   Class Start Time 1357   Class Stop Time 1429   Class Time Calculation (min) 32 min    Row Name 02/04/23 1400     Education   Cardiac Education Topics Pritikin   Nurse, children's Nurse   Select Nutrition   Nutrition Becoming a Pritikin Chef   Instruction Review Code 1- Verbalizes Understanding   Class Start Time 1356    Row Name 02/09/23 1400     Education   Cardiac Education Topics Pritikin   Select Core Videos     Core Videos   Educator Exercise Physiologist   Select Nutrition   Nutrition Other  Label Reading   Instruction Review Code 1- Verbalizes Understanding   Class Start Time 1356   Class Stop Time 1430   Class Time Calculation (min) 34 min    Row Name 02/11/23 1300     Education   Cardiac Education Topics Pritikin   Customer service manager   Weekly Topic Tasty Appetizers and Snacks   Instruction Review Code 1- Verbalizes Understanding   Class Start Time 1355   Class Stop Time 1440   Class Time Calculation (min) 45 min    Row Name 02/13/23 1400     Education   Cardiac Education Topics Pritikin   Software engineer     Workshops   Nutrition Workshop Label Reading    Instruction Review Code 1- Verbalizes Understanding   Class Start Time 1401   Class Stop Time 1433   Class Time Calculation (min) 32 min    Row Name 02/16/23 1600     Education   Cardiac Education Topics Pritikin   Select Workshops     Workshops   Educator Exercise Physiologist   Select Psychosocial   Psychosocial Workshop Recognizing and Reducing Stress   Instruction Review Code 1- Verbalizes Understanding   Class Start Time 1359   Class Stop Time 1445   Class Time Calculation (min) 46 min    Row Name 02/18/23 1500     Education   Cardiac Education Topics Pritikin   Customer service manager   Weekly Topic Efficiency Cooking - Meals in a Snap   Instruction Review Code 1- Verbalizes Understanding   Class Start Time 1358   Class Stop Time 1438   Class Time Calculation (min) 40 min            Core Videos: Exercise    Move It!  Clinical staff conducted group or individual video education with verbal and written material and guidebook.  Patient learns the recommended Pritikin exercise program. Exercise with the goal of living a long, healthy life. Some of the health benefits of exercise include controlled diabetes, healthier blood pressure levels, improved cholesterol levels, improved heart and lung capacity, improved sleep, and better body composition. Everyone should speak with  their doctor before starting or changing an exercise routine.  Biomechanical Limitations Clinical staff conducted group or individual video education with verbal and written material and guidebook.  Patient learns how biomechanical limitations can impact exercise and how we can mitigate and possibly overcome limitations to have an impactful and balanced exercise routine.  Body Composition Clinical staff conducted group or individual video education with verbal and written material and guidebook.  Patient learns that body composition (ratio of muscle mass to  fat mass) is a key component to assessing overall fitness, rather than body weight alone. Increased fat mass, especially visceral belly fat, can put [place] at increased risk for metabolic syndrome, type 2 diabetes, heart disease, and even death. It is recommended to combine diet and exercise (cardiovascular and resistance training) to improve your body composition. Seek guidance from your physician and exercise physiologist before implementing an exercise routine.  Exercise Action Plan Clinical staff conducted group or individual video education with verbal and written material and guidebook.  Patient learns the recommended strategies to achieve and enjoy long-term exercise adherence, including variety, self-motivation, self-efficacy, and positive decision making. Benefits of exercise include fitness, good health, weight management, more energy, better sleep, less stress, and overall well-being.  Medical   Heart Disease Risk Reduction Clinical staff conducted group or individual video education with verbal and written material and guidebook.  Patient learns our heart is our most vital organ as it circulates oxygen, nutrients, white blood cells, and hormones throughout the entire body, and carries waste away. Data supports a plant-based eating plan like the Pritikin Program for its effectiveness in slowing progression of and reversing heart disease. The video provides a number of recommendations to address heart disease.   Metabolic Syndrome and Belly Fat  Clinical staff conducted group or individual video education with verbal and written material and guidebook.  Patient learns what metabolic syndrome is, how it leads to heart disease, and how one can reverse it and keep it from coming back. You have metabolic syndrome if you have 3 of the following 5 criteria: abdominal obesity, high blood pressure, high triglycerides, low HDL cholesterol, and high blood sugar.  Hypertension and Heart Disease Clinical  staff conducted group or individual video education with verbal and written material and guidebook.  Patient learns that high blood pressure, or hypertension, is very common in the [place]. Hypertension is largely due to excessive salt intake, but other important risk factors include being overweight, physical inactivity, drinking too much alcohol, smoking, and not eating enough potassium from fruits and vegetables. High blood pressure is a leading risk factor for heart attack, stroke, congestive heart failure, dementia, kidney failure, and premature death. Long-term effects of excessive salt intake include stiffening of the arteries and thickening of heart muscle and organ damage. Recommendations include ways to reduce hypertension and the risk of heart disease.  Diseases of Our Time - Focusing on Diabetes Clinical staff conducted group or individual video education with verbal and written material and guidebook.  Patient learns why the best way to stop diseases of our time is prevention, through food and other lifestyle changes. Medicine (such as prescription pills and surgeries) is often only a Band-Aid on the problem, not a long-term solution. Most common diseases of our time include obesity, type 2 diabetes, hypertension, heart disease, and cancer. The Pritikin Program is recommended and has been proven to help reduce, reverse, and/or prevent the damaging effects of metabolic syndrome.  Nutrition   Overview of the Pritikin Eating Plan  Clinical staff conducted group or individual video education with verbal and written material and guidebook.  Patient learns about the Pritikin Eating Plan for disease risk reduction. The Pritikin Eating Plan emphasizes a wide variety of unrefined, minimally-processed carbohydrates, like fruits, vegetables, whole grains, and legumes. Go, Caution, and Stop food choices are explained. Plant-based and lean animal proteins are emphasized. Rationale provided for low  sodium intake for blood pressure control, low added sugars for blood sugar stabilization, and low added fats and oils for coronary artery disease risk reduction and weight management.  Calorie Density  Clinical staff conducted group or individual video education with verbal and written material and guidebook.  Patient learns about calorie density and how it impacts the Pritikin Eating Plan. Knowing the characteristics of the food you choose will help you decide whether those foods will lead to weight gain or weight loss, and whether you want to consume more or less of them. Weight loss is usually a side effect of the Pritikin Eating Plan because of its focus on low calorie-dense foods.  Label Reading  Clinical staff conducted group or individual video education with verbal and written material and guidebook.  Patient learns about the Pritikin recommended label reading guidelines and corresponding recommendations regarding calorie density, added sugars, sodium content, and whole grains.  Dining Out - Part 1  Clinical staff conducted group or individual video education with verbal and written material and guidebook.  Patient learns that restaurant meals can be sabotaging because they can be so high in calories, fat, sodium, and/or sugar. Patient learns recommended strategies on how to positively address this and avoid unhealthy pitfalls.  Facts on Fats  Clinical staff conducted group or individual video education with verbal and written material and guidebook.  Patient learns that lifestyle modifications can be just as effective, if not more so, as many medications for lowering your risk of heart disease. A Pritikin lifestyle can help to reduce your risk of inflammation and atherosclerosis (cholesterol build-up, or plaque, in the artery walls). Lifestyle interventions such as dietary choices and physical activity address the cause of atherosclerosis. A review of the types of fats and their impact on  blood cholesterol levels, along with dietary recommendations to reduce fat intake is also included.  Nutrition Action Plan  Clinical staff conducted group or individual video education with verbal and written material and guidebook.  Patient learns how to incorporate Pritikin recommendations into their lifestyle. Recommendations include planning and keeping personal health goals in mind as an important part of their success.  Healthy Mind-Set    Healthy Minds, Bodies, Hearts  Clinical staff conducted group or individual video education with verbal and written material and guidebook.  Patient learns how to identify when they are stressed. Video will discuss the impact of that stress, as well as the many benefits of stress management. Patient will also be introduced to stress management techniques. The way we think, act, and feel has an impact on our hearts.  How Our Thoughts Can Heal Our Hearts  Clinical staff conducted group or individual video education with verbal and written material and guidebook.  Patient learns that negative thoughts can cause depression and anxiety. This can result in negative lifestyle behavior and serious health problems. Cognitive behavioral therapy is an effective method to help control our thoughts in order to change and improve our emotional outlook.  Additional Videos:  Exercise    Improving Performance  Clinical staff conducted group or individual video education with verbal and written material  and guidebook.  Patient learns to use a non-linear approach by alternating intensity levels and lengths of time spent exercising to help burn more calories and lose more body fat. Cardiovascular exercise helps improve heart health, metabolism, hormonal balance, blood sugar control, and recovery from fatigue. Resistance training improves strength, endurance, balance, coordination, reaction time, metabolism, and muscle mass. Flexibility exercise improves circulation, posture,  and balance. Seek guidance from your physician and exercise physiologist before implementing an exercise routine and learn your capabilities and proper form for all exercise.  Introduction to Yoga  Clinical staff conducted group or individual video education with verbal and written material and guidebook.  Patient learns about yoga, a discipline of the coming together of mind, breath, and body. The benefits of yoga include improved flexibility, improved range of motion, better posture and core strength, increased lung function, weight loss, and positive self-image. Yoga's heart health benefits include lowered blood pressure, healthier heart rate, decreased cholesterol and triglyceride levels, improved immune function, and reduced stress. Seek guidance from your physician and exercise physiologist before implementing an exercise routine and learn your capabilities and proper form for all exercise.  Medical   Aging: Enhancing Your Quality of Life  Clinical staff conducted group or individual video education with verbal and written material and guidebook.  Patient learns key strategies and recommendations to stay in good physical health and enhance quality of life, such as prevention strategies, having an advocate, securing a Health Care Proxy and Power of Attorney, and keeping a list of medications and system for tracking them. It also discusses how to avoid risk for bone loss.  Biology of Weight Control  Clinical staff conducted group or individual video education with verbal and written material and guidebook.  Patient learns that weight gain occurs because we consume more calories than we burn (eating more, moving less). Even if your body weight is normal, you may have higher ratios of fat compared to muscle mass. Too much body fat puts you at increased risk for cardiovascular disease, heart attack, stroke, type 2 diabetes, and obesity-related cancers. In addition to exercise, following the Pritikin  Eating Plan can help reduce your risk.  Decoding Lab Results  Clinical staff conducted group or individual video education with verbal and written material and guidebook.  Patient learns that lab test reflects one measurement whose values change over time and are influenced by many factors, including medication, stress, sleep, exercise, food, hydration, pre-existing medical conditions, and more. It is recommended to use the knowledge from this video to become more involved with your lab results and evaluate your numbers to speak with your doctor.   Diseases of Our Time - Overview  Clinical staff conducted group or individual video education with verbal and written material and guidebook.  Patient learns that according to the CDC, 50% to 70% of chronic diseases (such as obesity, type 2 diabetes, elevated lipids, hypertension, and heart disease) are avoidable through lifestyle improvements including healthier food choices, listening to satiety cues, and increased physical activity.  Sleep Disorders Clinical staff conducted group or individual video education with verbal and written material and guidebook.  Patient learns how good quality and duration of sleep are important to overall health and well-being. Patient also learns about sleep disorders and how they impact health along with recommendations to address them, including discussing with a physician.  Nutrition  Dining Out - Part 2 Clinical staff conducted group or individual video education with verbal and written material and guidebook.  Patient learns how  to plan ahead and communicate in order to maximize their dining experience in a healthy and nutritious manner. Included are recommended food choices based on the type of restaurant the patient is visiting.   Fueling a Banker conducted group or individual video education with verbal and written material and guidebook.  There is a strong connection between our food  choices and our health. Diseases like obesity and type 2 diabetes are very prevalent and are in large-part due to lifestyle choices. The Pritikin Eating Plan provides plenty of food and hunger-curbing satisfaction. It is easy to follow, affordable, and helps reduce health risks.  Menu Workshop  Clinical staff conducted group or individual video education with verbal and written material and guidebook.  Patient learns that restaurant meals can sabotage health goals because they are often packed with calories, fat, sodium, and sugar. Recommendations include strategies to plan ahead and to communicate with the manager, chef, or server to help order a healthier meal.  Planning Your Eating Strategy  Clinical staff conducted group or individual video education with verbal and written material and guidebook.  Patient learns about the Pritikin Eating Plan and its benefit of reducing the risk of disease. The Pritikin Eating Plan does not focus on calories. Instead, it emphasizes high-quality, nutrient-rich foods. By knowing the characteristics of the foods, we choose, we can determine their calorie density and make informed decisions.  Targeting Your Nutrition Priorities  Clinical staff conducted group or individual video education with verbal and written material and guidebook.  Patient learns that lifestyle habits have a tremendous impact on disease risk and progression. This video provides eating and physical activity recommendations based on your personal health goals, such as reducing LDL cholesterol, losing weight, preventing or controlling type 2 diabetes, and reducing high blood pressure.  Vitamins and Minerals  Clinical staff conducted group or individual video education with verbal and written material and guidebook.  Patient learns different ways to obtain key vitamins and minerals, including through a recommended healthy diet. It is important to discuss all supplements you take with your  doctor.   Healthy Mind-Set    Smoking Cessation  Clinical staff conducted group or individual video education with verbal and written material and guidebook.  Patient learns that cigarette smoking and tobacco addiction pose a serious health risk which affects millions of people. Stopping smoking will significantly reduce the risk of heart disease, lung disease, and many forms of cancer. Recommended strategies for quitting are covered, including working with your doctor to develop a successful plan.  Culinary   Becoming a Set designer conducted group or individual video education with verbal and written material and guidebook.  Patient learns that cooking at home can be healthy, cost-effective, quick, and puts them in control. Keys to cooking healthy recipes will include looking at your recipe, assessing your equipment needs, planning ahead, making it simple, choosing cost-effective seasonal ingredients, and limiting the use of added fats, salts, and sugars.  Cooking - Breakfast and Snacks  Clinical staff conducted group or individual video education with verbal and written material and guidebook.  Patient learns how important breakfast is to satiety and nutrition through the entire day. Recommendations include key foods to eat during breakfast to help stabilize blood sugar levels and to prevent overeating at meals later in the day. Planning ahead is also a key component.  Cooking - Educational psychologist conducted group or individual video education with verbal and written material  and guidebook.  Patient learns eating strategies to improve overall health, including an approach to cook more at home. Recommendations include thinking of animal protein as a side on your plate rather than center stage and focusing instead on lower calorie dense options like vegetables, fruits, whole grains, and plant-based proteins, such as beans. Making sauces in large quantities to freeze  for later and leaving the skin on your vegetables are also recommended to maximize your experience.  Cooking - Healthy Salads and Dressing Clinical staff conducted group or individual video education with verbal and written material and guidebook.  Patient learns that vegetables, fruits, whole grains, and legumes are the foundations of the Pritikin Eating Plan. Recommendations include how to incorporate each of these in flavorful and healthy salads, and how to create homemade salad dressings. Proper handling of ingredients is also covered. Cooking - Soups and [employer] - Soups and Desserts Clinical staff conducted group or individual video education with verbal and written material and guidebook.  Patient learns that Pritikin soups and desserts make for easy, nutritious, and delicious snacks and meal components that are low in sodium, fat, sugar, and calorie density, while high in vitamins, minerals, and filling fiber. Recommendations include simple and healthy ideas for soups and desserts.   Overview     The Pritikin Solution Program Overview Clinical staff conducted group or individual video education with verbal and written material and guidebook.  Patient learns that the results of the Pritikin Program have been documented in more than 100 articles published in peer-reviewed journals, and the benefits include reducing risk factors for (and, in some cases, even reversing) high cholesterol, high blood pressure, type 2 diabetes, obesity, and more! An overview of the three key pillars of the Pritikin Program will be covered: eating well, doing regular exercise, and having a healthy mind-set.  WORKSHOPS  Exercise: Exercise Basics: Building Your Action Plan Clinical staff led group instruction and group discussion with PowerPoint presentation and patient guidebook. To enhance the learning environment the use of posters, models and videos may be added. At the conclusion of this workshop,  patients will comprehend the difference between physical activity and exercise, as well as the benefits of incorporating both, into their routine. Patients will understand the FITT (Frequency, Intensity, Time, and Type) principle and how to use it to build an exercise action plan. In addition, safety concerns and other considerations for exercise and cardiac rehab will be addressed by the presenter. The purpose of this lesson is to promote a comprehensive and effective weekly exercise routine in order to improve patients' overall level of fitness.   Managing Heart Disease: Your Path to a Healthier Heart Clinical staff led group instruction and group discussion with PowerPoint presentation and patient guidebook. To enhance the learning environment the use of posters, models and videos may be added.At the conclusion of this workshop, patients will understand the anatomy and physiology of the heart. Additionally, they will understand how Pritikin's three pillars impact the risk factors, the progression, and the management of heart disease.  The purpose of this lesson is to provide a high-level overview of the heart, heart disease, and how the Pritikin lifestyle positively impacts risk factors.  Exercise Biomechanics Clinical staff led group instruction and group discussion with PowerPoint presentation and patient guidebook. To enhance the learning environment the use of posters, models and videos may be added. Patients will learn how the structural parts of their bodies function and how these functions impact their daily activities, movement, and  exercise. Patients will learn how to promote a neutral spine, learn how to manage pain, and identify ways to improve their physical movement in order to promote healthy living. The purpose of this lesson is to expose patients to common physical limitations that impact physical activity. Participants will learn practical ways to adapt and manage aches and  pains, and to minimize their effect on regular exercise. Patients will learn how to maintain good posture while sitting, walking, and lifting.  Balance Training and Fall Prevention  Clinical staff led group instruction and group discussion with PowerPoint presentation and patient guidebook. To enhance the learning environment the use of posters, models and videos may be added. At the conclusion of this workshop, patients will understand the importance of their sensorimotor skills (vision, proprioception, and the vestibular system) in maintaining their ability to balance as they age. Patients will apply a variety of balancing exercises that are appropriate for their current level of function. Patients will understand the common causes for poor balance, possible solutions to these problems, and ways to modify their physical environment in order to minimize their fall risk. The purpose of this lesson is to teach patients about the importance of maintaining balance as they age and ways to minimize their risk of falling.  WORKSHOPS   Nutrition:  Fueling a Ship broker led group instruction and group discussion with PowerPoint presentation and patient guidebook. To enhance the learning environment the use of posters, models and videos may be added. Patients will review the foundational principles of the Pritikin Eating Plan and understand what constitutes a serving size in each of the food groups. Patients will also learn Pritikin-friendly foods that are better choices when away from home and review make-ahead meal and snack options. Calorie density will be reviewed and applied to three nutrition priorities: weight maintenance, weight loss, and weight gain. The purpose of this lesson is to reinforce (in a group setting) the key concepts around what patients are recommended to eat and how to apply these guidelines when away from home by planning and selecting Pritikin-friendly options.  Patients will understand how calorie density may be adjusted for different weight management goals.  Mindful Eating  Clinical staff led group instruction and group discussion with PowerPoint presentation and patient guidebook. To enhance the learning environment the use of posters, models and videos may be added. Patients will briefly review the concepts of the Pritikin Eating Plan and the importance of low-calorie dense foods. The concept of mindful eating will be introduced as well as the importance of paying attention to internal hunger signals. Triggers for non-hunger eating and techniques for dealing with triggers will be explored. The purpose of this lesson is to provide patients with the opportunity to review the basic principles of the Pritikin Eating Plan, discuss the value of eating mindfully and how to measure internal cues of hunger and fullness using the Hunger Scale. Patients will also discuss reasons for non-hunger eating and learn strategies to use for controlling emotional eating.  Targeting Your Nutrition Priorities Clinical staff led group instruction and group discussion with PowerPoint presentation and patient guidebook. To enhance the learning environment the use of posters, models and videos may be added. Patients will learn how to determine their genetic susceptibility to disease by reviewing their family history. Patients will gain insight into the importance of diet as part of an overall healthy lifestyle in mitigating the impact of genetics and other environmental insults. The purpose of this lesson is to provide patients  with the opportunity to assess their personal nutrition priorities by looking at their family history, their own health history and current risk factors. Patients will also be able to discuss ways of prioritizing and modifying the Pritikin Eating Plan for their highest risk areas  Menu  Clinical staff led group instruction and group discussion with PowerPoint  presentation and patient guidebook. To enhance the learning environment the use of posters, models and videos may be added. Using menus brought in from [employer], or printed from [employer], patients will apply the Pritikin dining out guidelines that were presented in the [employer] video. Patients will also be able to practice these guidelines in a variety of provided scenarios. The purpose of this lesson is to provide patients with the opportunity to practice hands-on learning of the Pritikin Dining Out guidelines with actual menus and practice scenarios.  Label Reading Clinical staff led group instruction and group discussion with PowerPoint presentation and patient guidebook. To enhance the learning environment the use of posters, models and videos may be added. Patients will review and discuss the Pritikin label reading guidelines presented in Pritikin's Label Reading Educational series video. Using fool labels brought in from local grocery stores and markets, patients will apply the label reading guidelines and determine if the packaged food meet the Pritikin guidelines. The purpose of this lesson is to provide patients with the opportunity to review, discuss, and practice hands-on learning of the Pritikin Label Reading guidelines with actual packaged food labels. Cooking School  Pritikin's [employer] are designed to teach patients ways to prepare quick, simple, and affordable recipes at home. The importance of nutrition's role in chronic disease risk reduction is reflected in its emphasis in the overall Pritikin program. By learning how to prepare essential core Pritikin Eating Plan recipes, patients will increase control over what they eat; be able to customize the flavor of foods without the use of added salt, sugar, or fat; and improve the quality of the food they consume. By learning a set of core recipes which are easily assembled, quickly prepared, and  affordable, patients are more likely to prepare more healthy foods at home. These workshops focus on convenient breakfasts, simple entres, side dishes, and desserts which can be prepared with minimal effort and are consistent with nutrition recommendations for cardiovascular risk reduction. Cooking [employer] are taught by a Armed forces logistics/support/administrative officer (RD) who has been trained by the [employer]. The chef or RD has a clear understanding of the importance of minimizing - if not completely eliminating - added fat, sugar, and sodium in recipes. Throughout the series of Cooking School Workshop sessions, patients will learn about healthy ingredients and efficient methods of cooking to build confidence in their capability to prepare    Cooking School weekly topics:  Adding Flavor- Sodium-Free  Fast and Healthy Breakfasts  Powerhouse Plant-Based Proteins  Satisfying Salads and Dressings  Simple Sides and Sauces  International Cuisine-Spotlight on the [employer] Zones  Delicious Desserts  Savory Soups  [employer] - Meals in a Astronomer Appetizers and Snacks  Comforting Weekend Breakfasts  One-Pot Wonders   Fast Evening Meals  Landscape architect Your Pritikin Plate  WORKSHOPS   Healthy Mindset (Psychosocial):  Focused Goals, Sustainable Changes Clinical staff led group instruction and group discussion with PowerPoint presentation and patient guidebook. To enhance the learning environment the use of posters, models and videos may be added. Patients will be able to apply effective  goal setting strategies to establish at least one personal goal, and then take consistent, meaningful action toward that goal. They will learn to identify common barriers to achieving personal goals and develop strategies to overcome them. Patients will also gain an understanding of how our mind-set can impact our ability to achieve goals and the importance of cultivating a positive  and growth-oriented mind-set. The purpose of this lesson is to provide patients with a deeper understanding of how to set and achieve personal goals, as well as the tools and strategies needed to overcome common obstacles which may arise along the way.  From Head to Heart: The Power of a Healthy Outlook  Clinical staff led group instruction and group discussion with PowerPoint presentation and patient guidebook. To enhance the learning environment the use of posters, models and videos may be added. Patients will be able to recognize and describe the impact of emotions and mood on physical health. They will discover the importance of self-care and explore self-care practices which may work for them. Patients will also learn how to utilize the 4 C's to cultivate a healthier outlook and better manage stress and challenges. The purpose of this lesson is to demonstrate to patients how a healthy outlook is an essential part of maintaining good health, especially as they continue their cardiac rehab journey.  Healthy Sleep for a Healthy Heart Clinical staff led group instruction and group discussion with PowerPoint presentation and patient guidebook. To enhance the learning environment the use of posters, models and videos may be added. At the conclusion of this workshop, patients will be able to demonstrate knowledge of the importance of sleep to overall health, well-being, and quality of life. They will understand the symptoms of, and treatments for, common sleep disorders. Patients will also be able to identify daytime and nighttime behaviors which impact sleep, and they will be able to apply these tools to help manage sleep-related challenges. The purpose of this lesson is to provide patients with a general overview of sleep and outline the importance of quality sleep. Patients will learn about a few of the most common sleep disorders. Patients will also be introduced to the concept of "sleep hygiene," and  discover ways to self-manage certain sleeping problems through simple daily behavior changes. Finally, the workshop will motivate patients by clarifying the links between quality sleep and their goals of heart-healthy living.   Recognizing and Reducing Stress Clinical staff led group instruction and group discussion with PowerPoint presentation and patient guidebook. To enhance the learning environment the use of posters, models and videos may be added. At the conclusion of this workshop, patients will be able to understand the types of stress reactions, differentiate between acute and chronic stress, and recognize the impact that chronic stress has on their health. They will also be able to apply different coping mechanisms, such as reframing negative self-talk. Patients will have the opportunity to practice a variety of stress management techniques, such as deep abdominal breathing, progressive muscle relaxation, and/or guided imagery.  The purpose of this lesson is to educate patients on the role of stress in their lives and to provide healthy techniques for coping with it.  Learning Barriers/Preferences:  Learning Barriers/Preferences - 12/04/22 1412       Learning Barriers/Preferences   Learning Preferences Group Instruction;Individual Instruction;Skilled Demonstration;Verbal Instruction             Education Topics:  Knowledge Questionnaire Score:  Knowledge Questionnaire Score - 12/04/22 1412  Knowledge Questionnaire Score   Pre Score 22/24             Core Components/Risk Factors/Patient Goals at Admission:  Personal Goals and Risk Factors at Admission - 12/04/22 1413       Core Components/Risk Factors/Patient Goals on Admission    Weight Management Yes;Obesity;Weight Loss    Intervention Weight Management: Develop a combined nutrition and exercise program designed to reach desired caloric intake, while maintaining appropriate intake of nutrient and fiber, sodium  and fats, and appropriate energy expenditure required for the weight goal.;Weight Management: Provide education and appropriate resources to help participant work on and attain dietary goals.;Weight Management/Obesity: Establish reasonable short term and long term weight goals.;Obesity: Provide education and appropriate resources to help participant work on and attain dietary goals.    Admit Weight 292 lb 15.9 oz (132.9 kg)    Expected Outcomes Short Term: Continue to assess and modify interventions until short term weight is achieved;Long Term: Adherence to nutrition and physical activity/exercise program aimed toward attainment of established weight goal;Weight Loss: Understanding of general recommendations for a balanced deficit meal plan, which promotes 1-2 lb weight loss per week and includes a negative energy balance of [phone] kcal/d;Understanding recommendations for meals to include 15-35% energy as protein, 25-35% energy from fat, 35-60% energy from carbohydrates, less than 200mg  of dietary cholesterol, 20-35 gm of total fiber daily;Understanding of distribution of calorie intake throughout the day with the consumption of 4-5 meals/snacks    Heart Failure Yes    Intervention Provide a combined exercise and nutrition program that is supplemented with education, support and counseling about heart failure. Directed toward relieving symptoms such as shortness of breath, decreased exercise tolerance, and extremity edema.    Expected Outcomes Improve functional capacity of life;Short term: Attendance in program 2-3 days a week with increased exercise capacity. Reported lower sodium intake. Reported increased fruit and vegetable intake. Reports medication compliance.;Short term: Daily weights obtained and reported for increase. Utilizing diuretic protocols set by physician.;Long term: Adoption of self-care skills and reduction of barriers for early signs and symptoms recognition and intervention leading to  self-care maintenance.    Hypertension Yes    Intervention Monitor prescription use compliance.;Provide education on lifestyle modifcations including regular physical activity/exercise, weight management, moderate sodium restriction and increased consumption of fresh fruit, vegetables, and low fat dairy, alcohol moderation, and smoking cessation.    Expected Outcomes Short Term: Continued assessment and intervention until BP is < 140/83mm HG in hypertensive participants. < 130/58mm HG in hypertensive participants with diabetes, heart failure or chronic kidney disease.;Long Term: Maintenance of blood pressure at goal levels.    Stress Yes    Intervention Offer individual and/or small group education and counseling on adjustment to heart disease, stress management and health-related lifestyle change. Teach and support self-help strategies.;Refer participants experiencing significant psychosocial distress to appropriate mental health specialists for further evaluation and treatment. When possible, include family members and significant others in education/counseling sessions.    Expected Outcomes Short Term: Participant demonstrates changes in health-related behavior, relaxation and other stress management skills, ability to obtain effective social support, and compliance with psychotropic medications if prescribed.;Long Term: Emotional wellbeing is indicated by absence of clinically significant psychosocial distress or social isolation.             Core Components/Risk Factors/Patient Goals Review:   Goals and Risk Factor Review     Row Name 12/10/22 0849 12/30/22 1423 01/27/23 1351 02/19/23 0810       Core  Components/Risk Factors/Patient Goals Review   Personal Goals Review Weight Management/Obesity;Heart Failure;Hypertension;Stress Weight Management/Obesity;Heart Failure;Hypertension;Stress Weight Management/Obesity;Heart Failure;Hypertension;Stress Weight Management/Obesity;Heart  Failure;Hypertension;Stress    Review Harold Park did well with exercise on his first day of exercise at cardiac rehab. Vital signs were stable. CBG's were spot checked. WNL. Harold Park is doing well with exercise  cardiac rehab. Vital signs have been  stable post ablation Harold Park is doing well with exercise  cardiac rehab. Vital signs have been stable. Harold Park has gained 3.1 kg since starting cardiac rehab. Medication changes noted. Harold Park is doing well with exercise  cardiac rehab. Vital signs have been stable. Harold Park has gained 2.5  kg since starting cardiac rehab. Medication changes noted. Harold Park will complete cardiac rehab on 03/09/23.    Expected Outcomes Harold Park will continue to participate in cardiac rehab for exercise, nutrition and lifestyle modifications Harold Park will continue to participate in cardiac rehab for exercise, nutrition and lifestyle modifications Harold Park will continue to participate in cardiac rehab for exercise, nutrition and lifestyle modifications Harold Park will continue to participate in cardiac rehab for exercise, nutrition and lifestyle modifications             Core Components/Risk Factors/Patient Goals at Discharge (Final Review):   Goals and Risk Factor Review - 02/19/23 0810       Core Components/Risk Factors/Patient Goals Review   Personal Goals Review Weight Management/Obesity;Heart Failure;Hypertension;Stress    Review Harold Park is doing well with exercise  cardiac rehab. Vital signs have been stable. Harold Park has gained 2.5  kg since starting cardiac rehab. Medication changes noted. Harold Park will complete cardiac rehab on 03/09/23.    Expected Outcomes Harold Park will continue to participate in cardiac rehab for exercise, nutrition and lifestyle modifications             ITP Comments:  ITP Comments     Row Name 12/04/22 1320 12/09/22 0908 12/30/22 1416 01/27/23 1347 02/19/23 0804   ITP Comments Dr. Armanda Magic medical director. Introduction to pritikin education/ intensive cardiac rehab. Initital orientation packet  reviewed with patient. 30 Day ITP Review. Chadi started cardiac rehab on 12/08/22. Harold Park did well with exercise. 30 Day ITP Review. Tahji returned to exercise at  cardiac rehab post ablation per Christy Sartorius NP. 30 Day ITP Review. Harold Park has good attendance and participation with  exercise at  cardiac rehab 30 Day ITP Review. Harold Park continues to have  good attendance and participation with  exercise at  cardiac rehab            Comments: See ITP comments.Thayer Headings RN BSN

## 2023-02-20 ENCOUNTER — Encounter (HOSPITAL_COMMUNITY)
Admission: RE | Admit: 2023-02-20 | Discharge: 2023-02-20 | Disposition: A | Discharge status: Home / Self Care | Payer: BC Managed Care – PPO | Source: Ambulatory Visit | Attending: Internal Medicine

## 2023-02-20 ENCOUNTER — Other Ambulatory Visit (HOSPITAL_COMMUNITY): Payer: Self-pay

## 2023-02-20 ENCOUNTER — Other Ambulatory Visit: Payer: Self-pay

## 2023-02-20 DIAGNOSIS — I5022 Chronic systolic (congestive) heart failure: Secondary | ICD-10-CM

## 2023-02-21 ENCOUNTER — Other Ambulatory Visit (HOSPITAL_COMMUNITY): Payer: Self-pay

## 2023-02-23 ENCOUNTER — Encounter (HOSPITAL_COMMUNITY)
Admission: RE | Admit: 2023-02-23 | Discharge: 2023-02-23 | Disposition: A | Discharge status: Home / Self Care | Payer: BC Managed Care – PPO | Source: Ambulatory Visit | Attending: Internal Medicine | Admitting: Internal Medicine

## 2023-02-23 DIAGNOSIS — I5022 Chronic systolic (congestive) heart failure: Secondary | ICD-10-CM

## 2023-02-24 ENCOUNTER — Other Ambulatory Visit: Payer: Self-pay

## 2023-02-24 ENCOUNTER — Other Ambulatory Visit (HOSPITAL_COMMUNITY): Payer: Self-pay

## 2023-02-25 ENCOUNTER — Encounter (HOSPITAL_COMMUNITY)
Admission: RE | Admit: 2023-02-25 | Discharge: 2023-02-25 | Disposition: A | Discharge status: Home / Self Care | Payer: BC Managed Care – PPO | Source: Ambulatory Visit | Attending: Internal Medicine

## 2023-02-25 DIAGNOSIS — I5022 Chronic systolic (congestive) heart failure: Secondary | ICD-10-CM

## 2023-02-26 ENCOUNTER — Ambulatory Visit (HOSPITAL_COMMUNITY)
Admission: RE | Admit: 2023-02-26 | Discharge: 2023-02-26 | Disposition: A | Discharge status: Home / Self Care | Payer: BC Managed Care – PPO | Source: Ambulatory Visit | Attending: Internal Medicine | Admitting: Internal Medicine

## 2023-02-26 DIAGNOSIS — I110 Hypertensive heart disease with heart failure: Secondary | ICD-10-CM | POA: Diagnosis not present

## 2023-02-26 DIAGNOSIS — E119 Type 2 diabetes mellitus without complications: Secondary | ICD-10-CM | POA: Insufficient documentation

## 2023-02-26 DIAGNOSIS — I5022 Chronic systolic (congestive) heart failure: Secondary | ICD-10-CM

## 2023-02-26 DIAGNOSIS — I480 Paroxysmal atrial fibrillation: Secondary | ICD-10-CM | POA: Diagnosis not present

## 2023-02-26 DIAGNOSIS — I340 Nonrheumatic mitral (valve) insufficiency: Secondary | ICD-10-CM | POA: Insufficient documentation

## 2023-02-26 DIAGNOSIS — Z006 Encounter for examination for normal comparison and control in clinical research program: Secondary | ICD-10-CM

## 2023-02-26 LAB — ECHOCARDIOGRAM COMPLETE
AR max vel: 3.74 cm2
AV Area VTI: 3.8 cm2
AV Area mean vel: 3.41 cm2
AV Mean grad: 2 mm[Hg]
AV Peak grad: 3 mm[Hg]
Ao pk vel: 0.87 m/s
Area-P 1/2: 5.02 cm2
Calc EF: 41.4 %
MV M vel: 5.09 m/s
MV Peak grad: 103.6 mm[Hg]
S' Lateral: 5.9 cm
Single Plane A2C EF: 47.9 %
Single Plane A4C EF: 35.1 %

## 2023-02-26 NOTE — Research (Signed)
SITE: [id number]     Subject #[id number]    Subprotocol: A  Inclusion Criteria  Patients who meet all of the following criteria are eligible for enrollment as study participants:  Yes No  Age > 58 years old X   Eligible to wear Holter Study X    Exclusion Criteria  Patients who meet any of these criteria are not eligible for enrollment as study participants: Yes No  1. Receiving any mechanical (respiratory or circulatory) or renal support therapy at Screening or during Visit #1.  X  2.  Any other conditions that in the opinion of the investigators are likely to prevent compliance with the study protocol or pose a safety concern if the subject participates in the study.  X  3. Poor tolerance, namely susceptible to severe skin allergies from ECG adhesive patch application.  X   Protocol: REV H                                     Residential Zip code [phone] (First 3 digits ONLY)                                             [facility] Informed Consent   Subject Name: Harold Park  Subject met inclusion and exclusion criteria.  The informed consent form, study requirements and expectations were reviewed with the subject. Subject had opportunity to read consent and questions and concerns were addressed prior to the signing of the consent form.  The subject verbalized understanding of the trial requirements.  The subject agreed to participate in the [facility] EF ACT  trial and signed the informed consent at 13:34 on 26-Feb-2023.  The informed consent was obtained prior to performance of any protocol-specific procedures for the subject.  A copy of the signed informed consent was given to the subject and a copy was placed in the subject's medical record.   Dyanne Iha          Current Outpatient Medications:    albuterol (VENTOLIN HFA) 108 (90 Base) MCG/ACT inhaler, TAKE 2 PUFFS BY MOUTH EVERY 6 HOURS AS NEEDED FOR WHEEZE OR SHORTNESS OF BREATH, Disp: 8.5 each, Rfl: 2   amiodarone (PACERONE) 200 MG  tablet, Take 1 tablet (200 mg total) by mouth daily., Disp: 30 tablet, Rfl: 3   digoxin (LANOXIN) 0.125 MG tablet, Take 1 tablet (0.125 mg total) by mouth daily., Disp: 30 tablet, Rfl: 5   DULoxetine (CYMBALTA) 20 MG capsule, TAKE 1 CAPSULE BY MOUTH EVERY DAY, Disp: 90 capsule, Rfl: 3   ELIQUIS 5 MG TABS tablet, TAKE 1 TABLET BY MOUTH TWICE A DAY, Disp: 60 tablet, Rfl: 6   empagliflozin (JARDIANCE) 10 MG TABS tablet, Take 1 tablet (10 mg total) by mouth daily., Disp: 30 tablet, Rfl: 5   Magnesium 250 MG TABS, Take 250 mg by mouth every evening. Gummy, Disp: , Rfl:    ondansetron (ZOFRAN) 4 MG tablet, Take 1 tablet (4 mg total) by mouth every 8 (eight) hours as needed for nausea or vomiting., Disp: 60 tablet, Rfl: 1   potassium chloride SA (KLOR-CON M) 20 MEQ tablet, Take 2 tablets (40 mEq total) by mouth 2 (two) times daily., Disp: 360 tablet, Rfl: 3   spironolactone (ALDACTONE) 25 MG tablet, Take 1 tablet (25 mg total)  by mouth daily., Disp: 30 tablet, Rfl: 5   tirzepatide (ZEPBOUND) 7.5 MG/0.5ML Pen, Inject 7.5 mg into the skin once a week. After completing 5 mg Rx, Disp: 2 mL, Rfl: 0   torsemide (DEMADEX) 20 MG tablet, Take 2 tablets (40 mg total) by mouth 2 (two) times daily., Disp: 360 tablet, Rfl: 0

## 2023-02-27 ENCOUNTER — Encounter (HOSPITAL_COMMUNITY)
Admission: RE | Admit: 2023-02-27 | Discharge: 2023-02-27 | Disposition: A | Discharge status: Home / Self Care | Payer: BC Managed Care – PPO | Source: Ambulatory Visit | Attending: Internal Medicine | Admitting: Internal Medicine

## 2023-02-27 ENCOUNTER — Other Ambulatory Visit (HOSPITAL_COMMUNITY): Payer: Self-pay

## 2023-02-27 DIAGNOSIS — I5022 Chronic systolic (congestive) heart failure: Secondary | ICD-10-CM | POA: Diagnosis not present

## 2023-03-02 ENCOUNTER — Encounter (HOSPITAL_COMMUNITY)
Admission: RE | Admit: 2023-03-02 | Discharge: 2023-03-02 | Disposition: A | Discharge status: Home / Self Care | Payer: BC Managed Care – PPO | Source: Ambulatory Visit | Attending: Internal Medicine | Admitting: Internal Medicine

## 2023-03-02 VITALS — Ht 75.0 in | Wt 294.5 lb

## 2023-03-02 DIAGNOSIS — I5022 Chronic systolic (congestive) heart failure: Secondary | ICD-10-CM | POA: Diagnosis not present

## 2023-03-04 ENCOUNTER — Other Ambulatory Visit: Payer: Self-pay | Admitting: Family Medicine

## 2023-03-05 DIAGNOSIS — G4733 Obstructive sleep apnea (adult) (pediatric): Secondary | ICD-10-CM | POA: Diagnosis not present

## 2023-03-06 ENCOUNTER — Other Ambulatory Visit (HOSPITAL_COMMUNITY): Payer: Self-pay

## 2023-03-06 ENCOUNTER — Other Ambulatory Visit: Payer: Self-pay | Admitting: Cardiovascular Disease

## 2023-03-06 ENCOUNTER — Encounter (HOSPITAL_COMMUNITY)
Admission: RE | Admit: 2023-03-06 | Discharge: 2023-03-06 | Disposition: A | Discharge status: Home / Self Care | Payer: BC Managed Care – PPO | Source: Ambulatory Visit | Attending: Internal Medicine

## 2023-03-06 DIAGNOSIS — E669 Obesity, unspecified: Secondary | ICD-10-CM

## 2023-03-06 DIAGNOSIS — I5022 Chronic systolic (congestive) heart failure: Secondary | ICD-10-CM

## 2023-03-09 ENCOUNTER — Other Ambulatory Visit (HOSPITAL_COMMUNITY): Payer: Self-pay

## 2023-03-09 ENCOUNTER — Other Ambulatory Visit: Payer: Self-pay

## 2023-03-09 ENCOUNTER — Encounter (HOSPITAL_COMMUNITY)
Admission: RE | Admit: 2023-03-09 | Discharge: 2023-03-09 | Disposition: A | Discharge status: Home / Self Care | Payer: BC Managed Care – PPO | Source: Ambulatory Visit | Attending: Internal Medicine | Admitting: Internal Medicine

## 2023-03-09 ENCOUNTER — Other Ambulatory Visit (HOSPITAL_BASED_OUTPATIENT_CLINIC_OR_DEPARTMENT_OTHER): Payer: Self-pay

## 2023-03-09 ENCOUNTER — Encounter: Payer: Self-pay | Admitting: Cardiovascular Disease

## 2023-03-09 ENCOUNTER — Ambulatory Visit: Payer: BC Managed Care – PPO | Attending: Physician Assistant | Admitting: Cardiovascular Disease

## 2023-03-09 VITALS — BP 124/80 | HR 82 | Ht 75.0 in | Wt 298.4 lb

## 2023-03-09 DIAGNOSIS — I4819 Other persistent atrial fibrillation: Secondary | ICD-10-CM

## 2023-03-09 DIAGNOSIS — I4892 Unspecified atrial flutter: Secondary | ICD-10-CM

## 2023-03-09 DIAGNOSIS — I5022 Chronic systolic (congestive) heart failure: Secondary | ICD-10-CM | POA: Diagnosis not present

## 2023-03-09 MED ORDER — ZEPBOUND 10 MG/0.5ML ~~LOC~~ SOAJ
10.0000 mg | SUBCUTANEOUS | 0 refills | Status: DC
  Filled 2023-03-09 – 2023-07-07 (×2): qty 2, 28d supply, fill #0

## 2023-03-09 NOTE — Progress Notes (Incomplete)
Discharge Progress Report  Patient Details  Name: Harold Park MRN: [id number] Date of Birth: November 08, 1965 Referring Provider:   Flowsheet Row INTENSIVE CARDIAC REHAB ORIENT from 12/04/2022 in [facility]  Referring Provider Arvilla Meres, MD        Number of Visits: ***  Reason for Discharge:  Patient reached a stable level of exercise. Patient independent in their exercise. Patient has met program and personal goals.  Smoking History:  Social History   Tobacco Use  Smoking Status Never  Smokeless Tobacco Never    Diagnosis:  Heart failure, chronic systolic (HCC)  ADL [facility]:   Initial Exercise Prescription:  Initial Exercise Prescription - 12/04/22 1500       Date of Initial Exercise RX and Referring Provider   Date 12/04/22    Referring Provider Arvilla Meres, MD    Expected Discharge Date 02/25/23      Treadmill   MPH 2    Grade 0    Minutes 15    METs 3      Bike   Level 1.5    Watts 60    Minutes 15    METs 3      Prescription Details   Frequency (times per week) 3    Duration Progress to 30 minutes of continuous aerobic without signs/symptoms of physical distress      Intensity   THRR 40-80% of Max Heartrate 65-130    Ratings of Perceived Exertion 11-13    Perceived Dyspnea 0-4      Progression   Progression Continue progressive overload as per policy without signs/symptoms or physical distress.      Resistance Training   Training Prescription Yes    Weight 4    Reps 10-15             Discharge Exercise Prescription (Final Exercise Prescription Changes):  Exercise Prescription Changes - 03/02/23 1600       Response to Exercise   Blood Pressure (Admit) 120/62    Blood Pressure (Exit) 104/70    Heart Rate (Admit) 85 bpm    Heart Rate (Exercise) 137 bpm    Heart Rate (Exit) 99 bpm    Rating of Perceived Exertion (Exercise) 14    Symptoms None    Comments Reviewed  METs and goals    Duration Continue with 30 min of aerobic exercise without signs/symptoms of physical distress.    Intensity THRR unchanged      Progression   Progression Continue to progress workloads to maintain intensity without signs/symptoms of physical distress.    Average METs 5.5      Resistance Training   Training Prescription Yes    Weight 5 lbs    Reps 10-15    Time 10 Minutes      Interval Training   Interval Training No      Bike   Level 5.1    Watts 126    Minutes 15    METs 5.2      Rower   Level 3    Watts 89    Minutes 15    METs 5.77      Home Exercise Plan   Plans to continue exercise at Home (comment)    Frequency Add 3 additional days to program exercise sessions.    Initial Home Exercises Provided 02/11/23             Functional Capacity:  6 Minute Walk  Row Name 12/04/22 1511 02/27/23 1250       6 Minute Walk   Phase Initial Discharge    Distance 1490 feet 1878 feet    Distance % Change -- 26.04 %    Distance Feet Change -- 388 ft    Walk Time 6 minutes 6 minutes    # of Rest Breaks 0 0    MPH 2.82 3.6    METS 3.56 4.01    RPE 11 12    Perceived Dyspnea  0 0    VO2 Peak 11.75 14.04    Symptoms No Yes (comment)    Comments -- rt foot arch pain 4/10    Resting HR 87 bpm 88 bpm    Resting BP 100/68 108/70    Resting Oxygen Saturation  98 % --    Exercise Oxygen Saturation  during 6 min walk 95 % --    Max Ex. HR 109 bpm 108 bpm    Max Ex. BP 122/78 122/80    2 Minute Post BP 114/86 --             Psychological, QOL, Others - Outcomes: PHQ 2/9:    03/06/2023    1:24 PM 12/04/2022    2:13 PM 11/29/2021    9:21 AM  Depression screen PHQ 2/9  Decreased Interest 1 1 1   Down, Depressed, Hopeless 0 1 0  PHQ - 2 Score 1 2 1   Altered sleeping 2 0   Tired, decreased energy 2 2   Change in appetite 0 0   Feeling bad or failure about yourself  0 1   Trouble concentrating 0 0   Moving slowly or fidgety/restless 0 0    Suicidal thoughts 0 0   PHQ-9 Score 5 5   Difficult doing work/chores Somewhat difficult Somewhat difficult     Quality of Life:  Quality of Life - 12/04/22 1423       Quality of Life   Select Quality of Life      Quality of Life Scores   Health/Function Pre 15.57 %    Socioeconomic Pre 26.43 %    Psych/Spiritual Pre 18.79 %    Family Pre 28.3 %    GLOBAL Pre 20.34 %             Personal Goals: Goals established at orientation with interventions provided to work toward goal.  Personal Goals and Risk Factors at Admission - 12/04/22 1413       Core Components/Risk Factors/Patient Goals on Admission    Weight Management Yes;Obesity;Weight Loss    Intervention Weight Management: Develop a combined nutrition and exercise program designed to reach desired caloric intake, while maintaining appropriate intake of nutrient and fiber, sodium and fats, and appropriate energy expenditure required for the weight goal.;Weight Management: Provide education and appropriate resources to help participant work on and attain dietary goals.;Weight Management/Obesity: Establish reasonable short term and long term weight goals.;Obesity: Provide education and appropriate resources to help participant work on and attain dietary goals.    Admit Weight 292 lb 15.9 oz (132.9 kg)    Expected Outcomes Short Term: Continue to assess and modify interventions until short term weight is achieved;Long Term: Adherence to nutrition and physical activity/exercise program aimed toward attainment of established weight goal;Weight Loss: Understanding of general recommendations for a balanced deficit meal plan, which promotes 1-2 lb weight loss per week and includes a negative energy balance of [phone] kcal/d;Understanding recommendations for meals to include 15-35% energy as protein,  25-35% energy from fat, 35-60% energy from carbohydrates, less than 200mg  of dietary cholesterol, 20-35 gm of total fiber  daily;Understanding of distribution of calorie intake throughout the day with the consumption of 4-5 meals/snacks    Heart Failure Yes    Intervention Provide a combined exercise and nutrition program that is supplemented with education, support and counseling about heart failure. Directed toward relieving symptoms such as shortness of breath, decreased exercise tolerance, and extremity edema.    Expected Outcomes Improve functional capacity of life;Short term: Attendance in program 2-3 days a week with increased exercise capacity. Reported lower sodium intake. Reported increased fruit and vegetable intake. Reports medication compliance.;Short term: Daily weights obtained and reported for increase. Utilizing diuretic protocols set by physician.;Long term: Adoption of self-care skills and reduction of barriers for early signs and symptoms recognition and intervention leading to self-care maintenance.    Hypertension Yes    Intervention Monitor prescription use compliance.;Provide education on lifestyle modifcations including regular physical activity/exercise, weight management, moderate sodium restriction and increased consumption of fresh fruit, vegetables, and low fat dairy, alcohol moderation, and smoking cessation.    Expected Outcomes Short Term: Continued assessment and intervention until BP is < 140/27mm HG in hypertensive participants. < 130/32mm HG in hypertensive participants with diabetes, heart failure or chronic kidney disease.;Long Term: Maintenance of blood pressure at goal levels.    Stress Yes    Intervention Offer individual and/or small group education and counseling on adjustment to heart disease, stress management and health-related lifestyle change. Teach and support self-help strategies.;Refer participants experiencing significant psychosocial distress to appropriate mental health specialists for further evaluation and treatment. When possible, include family members and significant  others in education/counseling sessions.    Expected Outcomes Short Term: Participant demonstrates changes in health-related behavior, relaxation and other stress management skills, ability to obtain effective social support, and compliance with psychotropic medications if prescribed.;Long Term: Emotional wellbeing is indicated by absence of clinically significant psychosocial distress or social isolation.              Personal Goals Discharge:  Goals and Risk Factor Review     Row Name 12/10/22 0849 12/30/22 1423 01/27/23 1351 02/19/23 0810       Core Components/Risk Factors/Patient Goals Review   Personal Goals Review Weight Management/Obesity;Heart Failure;Hypertension;Stress Weight Management/Obesity;Heart Failure;Hypertension;Stress Weight Management/Obesity;Heart Failure;Hypertension;Stress Weight Management/Obesity;Heart Failure;Hypertension;Stress    Review Harold Park did well with exercise on his first day of exercise at cardiac rehab. Vital signs were stable. CBG's were spot checked. WNL. Harold Park is doing well with exercise  cardiac rehab. Vital signs have been  stable post ablation Harold Park is doing well with exercise  cardiac rehab. Vital signs have been stable. Harold Park has gained 3.1 kg since starting cardiac rehab. Medication changes noted. Harold Park is doing well with exercise  cardiac rehab. Vital signs have been stable. Harold Park has gained 2.5  kg since starting cardiac rehab. Medication changes noted. Harold Park will complete cardiac rehab on 03/09/23.    Expected Outcomes Harold Park will continue to participate in cardiac rehab for exercise, nutrition and lifestyle modifications Harold Park will continue to participate in cardiac rehab for exercise, nutrition and lifestyle modifications Harold Park will continue to participate in cardiac rehab for exercise, nutrition and lifestyle modifications Harold Park will continue to participate in cardiac rehab for exercise, nutrition and lifestyle modifications             Exercise Goals and  Review:  Exercise Goals     Row Name 12/04/22 1346  Exercise Goals   Increase Physical Activity Yes       Intervention Provide advice, education, support and counseling about physical activity/exercise needs.;Develop an individualized exercise prescription for aerobic and resistive training based on initial evaluation findings, risk stratification, comorbidities and participant's personal goals.       Expected Outcomes Short Term: Attend rehab on a regular basis to increase amount of physical activity.;Long Term: Exercising regularly at least 3-5 days a week.;Long Term: Add in home exercise to make exercise part of routine and to increase amount of physical activity.       Increase Strength and Stamina Yes       Intervention Provide advice, education, support and counseling about physical activity/exercise needs.;Develop an individualized exercise prescription for aerobic and resistive training based on initial evaluation findings, risk stratification, comorbidities and participant's personal goals.       Expected Outcomes Short Term: Increase workloads from initial exercise prescription for resistance, speed, and METs.;Short Term: Perform resistance training exercises routinely during rehab and add in resistance training at home;Long Term: Improve cardiorespiratory fitness, muscular endurance and strength as measured by increased METs and functional capacity ( )       Able to understand and use rate of perceived exertion (RPE) scale Yes       Intervention Provide education and explanation on how to use RPE scale       Expected Outcomes Short Term: Able to use RPE daily in rehab to express subjective intensity level;Long Term:  Able to use RPE to guide intensity level when exercising independently       Knowledge and understanding of Target Heart Rate Range (THRR) Yes       Intervention Provide education and explanation of THRR including how the numbers were predicted and where they  are located for reference       Expected Outcomes Short Term: Able to state/look up THRR;Short Term: Able to use daily as guideline for intensity in rehab;Long Term: Able to use THRR to govern intensity when exercising independently       Understanding of Exercise Prescription Yes       Intervention Provide education, explanation, and written materials on patient's individual exercise prescription       Expected Outcomes Short Term: Able to explain program exercise prescription;Long Term: Able to explain home exercise prescription to exercise independently                Exercise Goals Re-Evaluation:  Exercise Goals Re-Evaluation     Row Name 12/08/22 1345 01/07/23 1500 01/08/23 0850 02/02/23 1500 03/02/23 1606     Exercise Goal Re-Evaluation   Exercise Goals Review Increase Physical Activity;Increase Strength and Stamina;Able to understand and use rate of perceived exertion (RPE) scale Increase Physical Activity;Understanding of Exercise Prescription;Increase Strength and Stamina;Knowledge and understanding of Target Heart Rate Range (THRR);Able to understand and use rate of perceived exertion (RPE) scale -- Increase Physical Activity;Understanding of Exercise Prescription;Increase Strength and Stamina;Knowledge and understanding of Target Heart Rate Range (THRR);Able to understand and use rate of perceived exertion (RPE) scale Increase Physical Activity;Understanding of Exercise Prescription;Increase Strength and Stamina;Knowledge and understanding of Target Heart Rate Range (THRR);Able to understand and use rate of perceived exertion (RPE) scale   Comments Patient able to understand and use RPE  scale appropriately. Reviewed METs and goals. Peak METs to date are 3.9. Pt feeels he is making progress on his goal on imrpoving strength and stamina. Pt see imrpovement in musle tone in his legs, also a goal. Pt  has not gotten back to playing golf yet but plans to try ssoon. Pt alos has goal of an  imrpoved EF. Pt echo coming up next week. -- Reviewed METs and goals. Peak METs to date are 6.0. Pt feeels he is making progress on his goal on imrpoving strength and stamina. Pt continues to see imrpovement in musle tone in his legs, also a goal. Reviewed METs and goals. Peak METs to date are still 6.0. Pt feeels he is making progress on his goal on imrpoving strength and stamina witht he additon of the rowing machine. Pt continues to see imrpovement in musle tone in his legs.   Expected Outcomes Progress workloads as tolerated to increase cardiorespiratory fitness. Will continue to montior patient and progress exericse workloads as tolerated. -- Will continue to montior patient and progress exericse workloads as tolerated. Will continue to montior patient and progress exericse workloads as tolerated.            Nutrition & Weight - Outcomes:  Pre Biometrics - 12/04/22 1319       Pre Biometrics   Waist Circumference 51.5 inches    Hip Circumference 47 inches    Waist to Hip Ratio 1.1 %    Triceps Skinfold 28 mm    % Body Fat 37.7 %    Grip Strength 54 kg    Flexibility 11.5 in    Single Leg Stand 14.68 seconds             Post Biometrics - 02/27/23 1300        Post  Biometrics   Height 6\' 3"  (1.905 m)    Weight 133.6 kg    Waist Circumference 49.5 inches    Hip Circumference 46.25 inches    Waist to Hip Ratio 1.07 %    BMI (Calculated) 36.81    Triceps Skinfold 22 mm    % Body Fat 35.6 %    Grip Strength 62 kg    Flexibility 11.5 in    Single Leg Stand 23 seconds             Nutrition:  Nutrition Therapy & Goals - 01/09/23 1358       Nutrition Therapy   Diet Heart Healthy Diet      Personal Nutrition Goals   Nutrition Goal Patient to identify strategies for reducing cardiovascular risk by attending the [facility] education and nutrition series weekly.   goal in progress.   Personal Goal #2 Patient to improve diet quality by using the plate method as a guide  for meal planning to include lean protein/plant protein, fruits, vegetables, whole grains, nonfat dairy as part of a well-balanced diet.   goal in progress.   Personal Goal #3 Patient to reduce sodium intake to 1500mg  per day   goal in progress.   Personal Goal #4 Patient to identify strategies for weight loss of 0.5-2.0# per week.    Comments Goals in progress. Harold Park has medical history HTN, CHF, Afib, OSA. He continues to attend the [facility] education and nutrition series regularly. He has starting making some dietary changes including reduced sodium intake and increased dietary fiber.  His A1c remains in a prediabetic; he is taking zepbound, jardiance. He reports improved snacking behaviors since re-starting zepbound. He is up 4.6# since starting with our program. We discussed multiple strategies for weight loss including the plate method, protein supplements, tracking intake, high protein/high fiber snacks, mindful eating, etc. Patient will benefit from participation in intensive cardiac rehab for nutrition,  exercise, and lifestyle modification.      Intervention Plan   Intervention Prescribe, educate and counsel regarding individualized specific dietary modifications aiming towards targeted core components such as weight, hypertension, lipid management, diabetes, heart failure and other comorbidities.;Nutrition handout(s) given to patient.    Expected Outcomes Short Term Goal: Understand basic principles of dietary content, such as calories, fat, sodium, cholesterol and nutrients.;Long Term Goal: Adherence to prescribed nutrition plan.;Short Term Goal: A plan has been developed with personal nutrition goals set during dietitian appointment.             Nutrition Discharge:  Nutrition Assessments - 12/08/22 1549       Rate Your Plate Scores   Pre Score 55             Education Questionnaire Score:  Knowledge Questionnaire Score - 12/04/22 1412       Knowledge Questionnaire Score    Pre Score 22/24             Goals reviewed with patient; copy given to patient.Pt graduates from  Intensive/Traditional cardiac rehab program on 03/09/23  with completion of    ** exercise and education sessions. Pt maintained good attendance and progressed nicely during their participation in rehab as evidenced by increased MET level. Harold Park increased his distance on his post exercise walk test by 388 feet.   Medication list reconciled. Repeat  PHQ score-  .  Pt has made significant lifestyle changes and should be commended for their success.  **** achieved their goals during cardiac rehab.   Pt plans to continue exercise at home walking, playing golf. Harold Park plans to buy a rower.

## 2023-03-09 NOTE — Progress Notes (Signed)
Electrophysiology Office Note:    Date:  03/09/2023   [name], DOB 1965/09/08, MRN [id number]  PCP:  Kristian Covey, MD   [facility] Providers Cardiologist:  Armanda Magic, MD Electrophysiologist:  Maurice Small, MD  Sleep Medicine:  Armanda Magic, MD     Referring MD: Kristian Covey, MD   History of Present Illness:    Harold Park is a 57 y.o. male with a medical history significant for recurrent admissions for atrial flutter and fibrillation, congestive heart failure with reduced ejection fraction who presents for EP follow-up.     According to previous notes, he was first diagnosed with paroxysmal atrial flutter in November 2011.  His ejection fraction has been depressed with intermittent partial recovery for over a decade, was 30 to 35% in November 2011.  He was diagnosed with atrial fibrillation in April 2017 and converted to sinus rhythm prior to planned cardioversion.  He first came to [facility] health in May 2017.  At that time he was started on Entresto.  Sleep study showed severe sleep apnea.  He was seen by Dr. Graciela Husbands for ICD consideration. Holter monitor showed 6.3% PVCs, and his MRI did not show any LGE. ICD was deferred due to a possible reversible cause, and Ranexa was tried to suppress his PVCs.   He tried amiodarone but has not been able to tolerate it due to severe nausea.    He underwent DC cardioversion in April 2019 for recurrent AF and was seen in AF clinic days later but felt not to be a good candidate for ablation at the time due to his obesity (~330 lbs at the time) and LA dimension of 51 cm.  Continue to have recurrences of atrial fibrillation flutter and was again seen in A-fib clinic on August 2 and considered for Tikosyn load.  However, he has had recurrent admissions for AF and severe acute CHF and cardiogenic shock for which he received IV amiodarone loads. On at least one occasion, he presented with a very wide QRS rhythm,  likely due to antiarrhythmic drug effects.  He was able to maintain sinus rhythm on amiodarone, which he had tolerated poorly in the past.  He underwent ablation for atrial fibrillation on October 9 with pulsed with ablation of the pulmonary veins and posterior wall of the left atrium.  Additionally, a CTI line was performed.  During the ablation he converted to an atypical flutter that broke during ablation of the posterior wall.     Today, he reports that he is doing well.  He is participating in cardiac rehab.  He has some decreased energy and shortness of breath though this has improved.  He has not had any evidence of recurrence of A-fib such as rapid heart rates or notifications from his Apple Watch.  EKGs/Labs/Other Studies Reviewed Today:    Echocardiogram:  Echocardiogram October 28, 2022 LVEF 25 to 30%.  Severe LV dilation.  Left atrium severely dilated, 5.2 cm; right atrium moderately dilated.   Monitors:   Stress testing:   Advanced imaging:  CTA coronary December 2021 0 Agastson units  Cardiac catherization   EKG:         Physical Exam:    VS:  BP 124/80 (BP Location: Left Arm, Patient Position: Sitting, Cuff Size: Large)   Pulse 82   Ht 6\' 3"  (1.905 m)   Wt 298 lb 6.4 oz (135.4 kg)   SpO2 98%   BMI 37.30 kg/m  Wt Readings from Last 3 Encounters:  03/09/23 298 lb 6.4 oz (135.4 kg)  02/27/23 294 lb 8.6 oz (133.6 kg)  02/10/23 298 lb 12.8 oz (135.5 kg)     GEN: Well nourished, well developed in no acute distress CARDIAC: RRR, no murmurs, rubs, gallops RESPIRATORY:  Normal work of breathing MUSCULOSKELETAL: no edema    ASSESSMENT & PLAN:     Persistent atrial fibrillation Has difficult to control RVR, resulting in multiple CHF admissions Status post ablation December 17, 2022 --maintaining sinus rhythm still on amiodarone Encouraged him to monitor his heart rate regularly and report rapid rates or symptoms of heart failure Will discontinue  amiodarone today  Atrial flutter -- appearing both typical and atypical S/p ablation of the CTI and posterior wall of the LA   CHFrEF Appears euvolemic and compensated today Repeat TTE shows persistently decreased EF (25-30%) despite 90 days of GDMT and rhythm control We discussed the indication for ICD, but he is a little hesitant. I am going to order a CMR and will follow-up again in the near future Continue spironolactone 25 torsemide 20, empagliflozin 10, digoxin 0.125  Secondary hypercoagulable state Continue Eliquis 5 g twice daily  Obesity Currently at 298 pounds.   We discussed the importance of weight loss for maintenance of sinus rhythm    Signed, Maurice Small, MD  03/09/2023 11:07 AM    [facility]

## 2023-03-09 NOTE — Patient Instructions (Addendum)
Medication Instructions:  STOP Amiodarone  *If you need a refill on your cardiac medications before your next appointment, please call your pharmacy*  Labs: CBC prior to cardiac MRI - please have this completed at any [facility] about a week prior to MRI once scheduled  Testing/Procedures: Cardiac MRI - someone will contact you to schedule this - Dr Nelly Laurence would like this completed mid-late Feb 2025  Your physician has requested that you have a cardiac MRI. Cardiac MRI uses a computer to create images of your heart as its beating, producing both still and moving pictures of your heart and major blood vessels. For further information please visit [email or web address]. Please follow the instruction sheet given to you today for more information.    Follow-Up: At [facility], you and your health needs are our priority.  As part of our continuing mission to provide you with exceptional heart care, we have created designated Provider Care Teams.  These Care Teams include your primary Cardiologist (physician) and Advanced Practice Providers (APPs -  Physician Assistants and Nurse Practitioners) who all work together to provide you with the care you need, when you need it.  We recommend signing up for the patient portal called "MyChart".  Sign up information is provided on this After Visit Summary.  MyChart is used to connect with patients for Virtual Visits (Telemedicine).  Patients are able to view lab/test results, encounter notes, upcoming appointments, etc.  Non-urgent messages can be sent to your provider as well.   To learn more about what you can do with MyChart, go to [email or web address].    Your next appointment:   3 month(s) after your cardiac MRI  Provider:   York Pellant, MD

## 2023-03-13 ENCOUNTER — Other Ambulatory Visit (INDEPENDENT_AMBULATORY_CARE_PROVIDER_SITE_OTHER): Payer: BC Managed Care – PPO

## 2023-03-13 DIAGNOSIS — R7989 Other specified abnormal findings of blood chemistry: Secondary | ICD-10-CM

## 2023-03-13 DIAGNOSIS — D751 Secondary polycythemia: Secondary | ICD-10-CM

## 2023-03-13 LAB — CBC WITH DIFFERENTIAL/PLATELET
Basophils Absolute: 0.1 10*3/uL (ref 0.0–0.1)
Basophils Relative: 0.6 % (ref 0.0–3.0)
Eosinophils Absolute: 0.7 10*3/uL (ref 0.0–0.7)
Eosinophils Relative: 5.2 % — ABNORMAL HIGH (ref 0.0–5.0)
HCT: 52.2 % — ABNORMAL HIGH (ref 39.0–52.0)
Hemoglobin: 17.5 g/dL — ABNORMAL HIGH (ref 13.0–17.0)
Lymphocytes Relative: 15.4 % (ref 12.0–46.0)
Lymphs Abs: 2.2 10*3/uL (ref 0.7–4.0)
MCHC: 33.5 g/dL (ref 30.0–36.0)
MCV: 90.2 fL (ref 78.0–100.0)
Monocytes Absolute: 1.9 10*3/uL — ABNORMAL HIGH (ref 0.1–1.0)
Monocytes Relative: 13.6 % — ABNORMAL HIGH (ref 3.0–12.0)
Neutro Abs: 9.1 10*3/uL — ABNORMAL HIGH (ref 1.4–7.7)
Neutrophils Relative %: 65.2 % (ref 43.0–77.0)
Platelets: 281 10*3/uL (ref 150.0–400.0)
RBC: 5.78 Mil/uL (ref 4.22–5.81)
RDW: 14 % (ref 11.5–15.5)
WBC: 13.9 10*3/uL — ABNORMAL HIGH (ref 4.0–10.5)

## 2023-03-13 LAB — TESTOSTERONE: Testosterone: 210.66 ng/dL — ABNORMAL LOW (ref 300.00–890.00)

## 2023-03-19 ENCOUNTER — Other Ambulatory Visit (HOSPITAL_COMMUNITY): Payer: Self-pay | Admitting: Adult Health

## 2023-03-23 ENCOUNTER — Ambulatory Visit: Payer: BC Managed Care – PPO | Admitting: Pulmonary Disease

## 2023-03-24 ENCOUNTER — Encounter (HOSPITAL_BASED_OUTPATIENT_CLINIC_OR_DEPARTMENT_OTHER): Payer: Self-pay | Admitting: Cardiology

## 2023-03-25 NOTE — Telephone Encounter (Signed)
 Spoke with patient, provided eliquis  30-day free trial card (patient states he's hasn't used one previously) and provided a $10 co-pay card. Also gave instructions if patient wanted to follow up for patient assistance ([email or web address]). No further needs at this time

## 2023-04-05 ENCOUNTER — Other Ambulatory Visit (HOSPITAL_COMMUNITY): Payer: Self-pay

## 2023-04-06 ENCOUNTER — Other Ambulatory Visit (HOSPITAL_COMMUNITY): Payer: Self-pay

## 2023-04-06 ENCOUNTER — Other Ambulatory Visit: Payer: Self-pay

## 2023-04-30 ENCOUNTER — Other Ambulatory Visit (HOSPITAL_COMMUNITY): Payer: Self-pay | Admitting: Internal Medicine

## 2023-05-01 ENCOUNTER — Encounter (HOSPITAL_COMMUNITY): Payer: Self-pay

## 2023-05-02 ENCOUNTER — Other Ambulatory Visit (HOSPITAL_COMMUNITY): Payer: Self-pay

## 2023-05-05 ENCOUNTER — Ambulatory Visit (HOSPITAL_COMMUNITY)
Admission: RE | Admit: 2023-05-05 | Discharge: 2023-05-05 | Disposition: A | Discharge status: Home / Self Care | Payer: BC Managed Care – PPO | Source: Ambulatory Visit | Attending: Cardiovascular Disease | Admitting: Cardiovascular Disease

## 2023-05-05 ENCOUNTER — Other Ambulatory Visit: Payer: Self-pay | Admitting: Cardiovascular Disease

## 2023-05-05 DIAGNOSIS — I4892 Unspecified atrial flutter: Secondary | ICD-10-CM

## 2023-05-05 DIAGNOSIS — I4819 Other persistent atrial fibrillation: Secondary | ICD-10-CM

## 2023-05-05 MED ORDER — GADOBUTROL 1 MMOL/ML IV SOLN
10.0000 mL | Freq: Once | INTRAVENOUS | Status: AC | PRN
  Administered 2023-05-05: 10 mL via INTRAVENOUS

## 2023-05-06 DIAGNOSIS — G4733 Obstructive sleep apnea (adult) (pediatric): Secondary | ICD-10-CM | POA: Diagnosis not present

## 2023-05-11 ENCOUNTER — Ambulatory Visit (INDEPENDENT_AMBULATORY_CARE_PROVIDER_SITE_OTHER): Payer: BC Managed Care – PPO | Admitting: Family Medicine

## 2023-05-11 ENCOUNTER — Other Ambulatory Visit (HOSPITAL_COMMUNITY): Payer: Self-pay

## 2023-05-11 ENCOUNTER — Encounter: Payer: Self-pay | Admitting: Family Medicine

## 2023-05-11 VITALS — BP 100/72 | HR 70 | Temp 97.9°F | Wt 300.4 lb

## 2023-05-11 DIAGNOSIS — D751 Secondary polycythemia: Secondary | ICD-10-CM

## 2023-05-11 DIAGNOSIS — Z7985 Long-term (current) use of injectable non-insulin antidiabetic drugs: Secondary | ICD-10-CM

## 2023-05-11 DIAGNOSIS — E1159 Type 2 diabetes mellitus with other circulatory complications: Secondary | ICD-10-CM | POA: Diagnosis not present

## 2023-05-11 DIAGNOSIS — Z7984 Long term (current) use of oral hypoglycemic drugs: Secondary | ICD-10-CM | POA: Diagnosis not present

## 2023-05-11 DIAGNOSIS — I5022 Chronic systolic (congestive) heart failure: Secondary | ICD-10-CM | POA: Diagnosis not present

## 2023-05-11 LAB — CBC WITH DIFFERENTIAL/PLATELET
Basophils Absolute: 0.1 10*3/uL (ref 0.0–0.1)
Basophils Relative: 1 % (ref 0.0–3.0)
Eosinophils Absolute: 1.2 10*3/uL — ABNORMAL HIGH (ref 0.0–0.7)
Eosinophils Relative: 9 % — ABNORMAL HIGH (ref 0.0–5.0)
HCT: 52 % (ref 39.0–52.0)
Hemoglobin: 17.5 g/dL — ABNORMAL HIGH (ref 13.0–17.0)
Lymphocytes Relative: 18.6 % (ref 12.0–46.0)
Lymphs Abs: 2.4 10*3/uL (ref 0.7–4.0)
MCHC: 33.7 g/dL (ref 30.0–36.0)
MCV: 90.9 fl (ref 78.0–100.0)
Monocytes Absolute: 1.1 10*3/uL — ABNORMAL HIGH (ref 0.1–1.0)
Monocytes Relative: 8.5 % (ref 3.0–12.0)
Neutro Abs: 8.3 10*3/uL — ABNORMAL HIGH (ref 1.4–7.7)
Neutrophils Relative %: 62.9 % (ref 43.0–77.0)
Platelets: 297 10*3/uL (ref 150.0–400.0)
RBC: 5.72 Mil/uL (ref 4.22–5.81)
RDW: 13.8 % (ref 11.5–15.5)
WBC: 13.1 10*3/uL — ABNORMAL HIGH (ref 4.0–10.5)

## 2023-05-11 LAB — COMPREHENSIVE METABOLIC PANEL
ALT: 26 U/L (ref 0–53)
AST: 23 U/L (ref 0–37)
Albumin: 4.1 g/dL (ref 3.5–5.2)
Alkaline Phosphatase: 72 U/L (ref 39–117)
BUN: 23 mg/dL (ref 6–23)
CO2: 24 meq/L (ref 19–32)
Calcium: 9.6 mg/dL (ref 8.4–10.5)
Chloride: 103 meq/L (ref 96–112)
Creatinine, Ser: 1.27 mg/dL (ref 0.40–1.50)
GFR: 62.08 mL/min (ref >60.00)
Glucose, Bld: 150 mg/dL — ABNORMAL HIGH (ref 70–99)
Potassium: 3.9 meq/L (ref 3.5–5.1)
Sodium: 138 meq/L (ref 135–145)
Total Bilirubin: 0.4 mg/dL (ref 0.2–1.2)
Total Protein: 7.3 g/dL (ref 6.0–8.3)

## 2023-05-11 LAB — HEMOGLOBIN A1C: Hgb A1c MFr Bld: 5.7 % (ref 4.6–6.5)

## 2023-05-11 NOTE — Progress Notes (Signed)
 Established Patient Office Visit  Subjective   Patient ID: Harold Park, male    DOB: [birth year]  Age: 58 y.o. MRN: [id number]  Chief Complaint  Patient presents with   Medical Management of Chronic Issues    HPI   Art is seen today for medical follow-up.  Past medical history reviewed.  He has history of chronic systolic heart failure, atrial fibs/flutter, hypertension, type 2 diabetes, obstructive sleep apnea, history of depression, low testosterone, secondary polycythemia.  Currently doing reasonly well.  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  He went through cardiac rehab program after lengthy admission last year for severe heart failure exacerbation.  He states he was taken off carvedilol recently.  Remains on several heart failure medications including torsemide 20 mg 2 tablets twice daily, Aldactone 25 mg daily, Jardiance 10 mg daily, and Lanoxin 0.125 mg daily.  No recent peripheral edema issues.  Watching sodium intake fairly closely.  He hopes to establish more consistent exercise at home soon.  Denies any recent chest pains.  Started per cardiology on GLP-1 medication with Mounjaro.  He had problems with insurance coverage and ran out about a month ago but plans to start back soon.  Last A1c was 6.5%.  Does have history of low testosterone but we have had our concerns regarding polycythemia.  Additionally, history of severe systolic dysfunction which poses an increased risk  Past Medical History:  Diagnosis Date   Chronic bronchitis    HTN (hypertension)    NICM (nonischemic cardiomyopathy) (HCC)    Obesity (BMI 30-39.9) 12/04/2015   OSA (obstructive sleep apnea) 08/19/2015   Severe with AHI 64/hr now on CPAP at 13cm H2O   PAF (paroxysmal atrial fibrillation) (HCC)    Systolic CHF, chronic (HCC)    Past Surgical History:  Procedure Laterality Date   APPENDECTOMY     ATRIAL FIBRILLATION ABLATION N/A 12/17/2022   Procedure: ATRIAL FIBRILLATION ABLATION;  Surgeon:  Maurice Small, MD;  Location: [facility] INVASIVE CV LAB;  Service: Cardiovascular;  Laterality: N/A;   BRONCHIAL WASHINGS  04/24/2020   Procedure: BRONCHIAL WASHINGS;  Surgeon: Leslye Peer, MD;  Location: [facility] ENDOSCOPY;  Service: Pulmonary;;   CARDIAC CATHETERIZATION     CARDIOVERSION N/A 06/19/2017   Procedure: CARDIOVERSION;  Surgeon: Dolores Patty, MD;  Location: [facility] ENDOSCOPY;  Service: Cardiovascular;  Laterality: N/A;   CARDIOVERSION N/A 10/02/2022   Procedure: CARDIOVERSION;  Surgeon: Dolores Patty, MD;  Location: [facility] INVASIVE CV LAB;  Service: Cardiovascular;  Laterality: N/A;   CARDIOVERSION N/A 10/20/2022   Procedure: CARDIOVERSION;  Surgeon: Dolores Patty, MD;  Location: [facility] INVASIVE CV LAB;  Service: Cardiovascular;  Laterality: N/A;   CARDIOVERSION N/A 10/23/2022   Procedure: CARDIOVERSION;  Surgeon: Dolores Patty, MD;  Location: [facility] INVASIVE CV LAB;  Service: Cardiovascular;  Laterality: N/A;   CARDIOVERSION N/A 11/13/2022   Procedure: CARDIOVERSION;  Surgeon: Dolores Patty, MD;  Location: [facility] INVASIVE CV LAB;  Service: Cardiovascular;  Laterality: N/A;   FINE NEEDLE ASPIRATION  04/24/2020   Procedure: FINE NEEDLE ASPIRATION (FNA) LINEAR;  Surgeon: Leslye Peer, MD;  Location: [facility] ENDOSCOPY;  Service: Pulmonary;;   LIPOMA RESECTION     arms   RIGHT HEART CATH N/A 10/30/2022   Procedure: RIGHT HEART CATH;  Surgeon: Dorthula Nettles, DO;  Location: [facility] INVASIVE CV LAB;  Service: Cardiovascular;  Laterality: N/A;   SPLENECTOMY     SPLIT NIGHT STUDY  08/12/2015   VIDEO BRONCHOSCOPY WITH ENDOBRONCHIAL ULTRASOUND  N/A 04/24/2020   Procedure: VIDEO BRONCHOSCOPY WITH ENDOBRONCHIAL ULTRASOUND;  Surgeon: Leslye Peer, MD;  Location: [facility] ENDOSCOPY;  Service: Pulmonary;  Laterality: N/A;    reports that he has never smoked. He has never used smokeless tobacco. He reports current alcohol use. He reports that he does not use drugs. family history includes Breast cancer in  his mother; Cancer in his father; Heart disease in an other family member; Hypertension in an other family member. Allergies  Allergen Reactions   Bee Venom Anaphylaxis    Review of Systems  Constitutional:  Negative for fever.  Eyes:  Negative for blurred vision.  Respiratory:  Negative for cough and hemoptysis.   Cardiovascular:  Negative for chest pain.  Neurological:  Negative for dizziness, weakness and headaches.      Objective:     BP 100/72 (BP Location: Left Arm, Patient Position: Sitting, Cuff Size: Large)   Pulse 70   Temp 97.9 F (36.6 C) (Oral)   Wt (!) 300 lb 6.4 oz (136.3 kg)   SpO2 97%   BMI 37.55 kg/m  BP Readings from Last 3 Encounters:  05/11/23 100/72  03/09/23 124/80  02/10/23 108/74   Wt Readings from Last 3 Encounters:  05/11/23 (!) 300 lb 6.4 oz (136.3 kg)  03/09/23 298 lb 6.4 oz (135.4 kg)  02/27/23 294 lb 8.6 oz (133.6 kg)      Physical Exam Vitals reviewed.  Constitutional:      General: He is not in acute distress.    Appearance: He is not ill-appearing.  Cardiovascular:     Rate and Rhythm: Regular rhythm.  Pulmonary:     Effort: Pulmonary effort is normal.     Breath sounds: Normal breath sounds. No wheezing or rales.  Musculoskeletal:     Right lower leg: No edema.     Left lower leg: No edema.  Neurological:     Mental Status: He is alert.      Results for orders placed or performed in visit on 05/11/23  Hemoglobin A1c  Result Value Ref Range   Hgb A1c MFr Bld 5.7 4.6 - 6.5 %  CBC with Differential/Platelet  Result Value Ref Range   WBC 13.1 (H) 4.0 - 10.5 K/uL   RBC 5.72 4.22 - 5.81 Mil/uL   Hemoglobin 17.5 (H) 13.0 - 17.0 g/dL   HCT 84.1 32.4 - 40.1 %   MCV 90.9 78.0 - 100.0 fl   MCHC 33.7 30.0 - 36.0 g/dL   RDW 02.7 25.3 - 66.4 %   Platelets 297.0 150.0 - 400.0 K/uL   Neutrophils Relative % 62.9 43.0 - 77.0 %   Lymphocytes Relative 18.6 12.0 - 46.0 %   Monocytes Relative 8.5 3.0 - 12.0 %   Eosinophils  Relative 9.0 (H) 0.0 - 5.0 %   Basophils Relative 1.0 0.0 - 3.0 %   Neutro Abs 8.3 (H) 1.4 - 7.7 K/uL   Lymphs Abs 2.4 0.7 - 4.0 K/uL   Monocytes Absolute 1.1 (H) 0.1 - 1.0 K/uL   Eosinophils Absolute 1.2 (H) 0.0 - 0.7 K/uL   Basophils Absolute 0.1 0.0 - 0.1 K/uL  CMP  Result Value Ref Range   Sodium 138 135 - 145 mEq/L   Potassium 3.9 3.5 - 5.1 mEq/L   Chloride 103 96 - 112 mEq/L   CO2 24 19 - 32 mEq/L   Glucose, Bld 150 (H) 70 - 99 mg/dL   BUN 23 6 - 23 mg/dL   Creatinine, Ser 4.03 0.40 -  1.50 mg/dL   Total Bilirubin 0.4 0.2 - 1.2 mg/dL   Alkaline Phosphatase 72 39 - 117 U/L   AST 23 0 - 37 U/L   ALT 26 0 - 53 U/L   Total Protein 7.3 6.0 - 8.3 g/dL   Albumin 4.1 3.5 - 5.2 g/dL   GFR 96.29 >52.84 mL/min   Calcium 9.6 8.4 - 10.5 mg/dL      The ASCVD Risk score (Arnett DK, et al., 2019) failed to calculate for the following reasons:   Cannot find a previous HDL lab   Cannot find a previous total cholesterol lab    Assessment & Plan:   #1 type 2 diabetes.  Strong family history.  A1c today improved 5.7%.  He did run out of Holcomb about a month ago and will contact cardiology who prescribed to get reinitiated on that.  Also remains on Jardiance 10 mg daily.  Continue lower glycemic diet.  Recommend monitoring A1c at least every 6 months  #2 history of low testosterone.  He did feel better symptomatically when taking testosterone.  However, had significant polycythemia.  In setting of heart failure with reduced ejection fraction with severe heart failure and polycythemia, reluctant to reinitiate testosterone unless recommended per cardiology  #3 heart failure with reduced ejection fraction.  Recheck labs with CBC, CMP.  Continue close follow-up with cardiology heart failure clinic   No follow-ups on file.    Evelena Peat, MD

## 2023-05-12 ENCOUNTER — Other Ambulatory Visit (HOSPITAL_COMMUNITY): Payer: Self-pay

## 2023-05-12 ENCOUNTER — Other Ambulatory Visit: Payer: Self-pay

## 2023-05-13 ENCOUNTER — Other Ambulatory Visit (HOSPITAL_COMMUNITY): Payer: Self-pay

## 2023-05-13 MED ORDER — DIGOXIN 125 MCG PO TABS
0.1250 mg | ORAL_TABLET | Freq: Every day | ORAL | 5 refills | Status: DC
  Filled 2023-05-13: qty 30, 30d supply, fill #0
  Filled 2023-06-23: qty 30, 30d supply, fill #1

## 2023-05-13 MED ORDER — SPIRONOLACTONE 25 MG PO TABS
25.0000 mg | ORAL_TABLET | Freq: Every day | ORAL | 5 refills | Status: DC
  Filled 2023-05-13: qty 30, 30d supply, fill #0
  Filled 2023-06-23: qty 30, 30d supply, fill #1
  Filled 2023-08-05: qty 30, 30d supply, fill #2
  Filled 2023-08-31: qty 30, 30d supply, fill #3
  Filled 2023-09-27: qty 30, 30d supply, fill #4
  Filled 2023-11-17: qty 30, 30d supply, fill #5

## 2023-05-13 NOTE — Addendum Note (Signed)
 Addended by: Tanieka Pownall, Milagros Reap on: 05/13/2023 09:14 AM   Modules accepted: Orders

## 2023-05-22 ENCOUNTER — Other Ambulatory Visit (HOSPITAL_COMMUNITY): Payer: Self-pay

## 2023-05-22 ENCOUNTER — Telehealth: Payer: Self-pay | Admitting: Pharmacy Technician

## 2023-05-22 NOTE — Addendum Note (Signed)
 Encounter addended by: Howell Rucks, RDCS on: 05/22/2023 1:29 PM  Actions taken: Imaging Exam ended

## 2023-05-22 NOTE — Telephone Encounter (Signed)
 Pharmacy Patient Advocate Encounter   Received notification from CoverMyMeds that prior authorization for zepbound is required/requested.   Insurance verification completed.   The patient is insured through  [place]  .   Per test claim: PA required; PA submitted to above mentioned insurance via CoverMyMeds Key/confirmation #/EOC [id number] Status is pending

## 2023-05-22 NOTE — Telephone Encounter (Signed)
 Pharmacy Patient Advocate Encounter  Received notification from  [employer]  that Prior Authorization for Zepbound has been APPROVED from 05/22/23 to 11/22/23   PA #/Case ID/Reference #: [id number]

## 2023-06-03 ENCOUNTER — Encounter: Payer: Self-pay | Admitting: Emergency Medicine

## 2023-06-03 DIAGNOSIS — G4733 Obstructive sleep apnea (adult) (pediatric): Secondary | ICD-10-CM | POA: Diagnosis not present

## 2023-06-05 ENCOUNTER — Ambulatory Visit: Payer: BC Managed Care – PPO | Admitting: Cardiovascular Disease

## 2023-06-17 ENCOUNTER — Ambulatory Visit: Attending: Cardiovascular Disease | Admitting: Cardiovascular Disease

## 2023-06-17 ENCOUNTER — Encounter: Payer: Self-pay | Admitting: Cardiovascular Disease

## 2023-06-17 VITALS — BP 102/78 | HR 95 | Ht 75.0 in | Wt 307.0 lb

## 2023-06-17 DIAGNOSIS — I4819 Other persistent atrial fibrillation: Secondary | ICD-10-CM | POA: Diagnosis not present

## 2023-06-17 NOTE — Progress Notes (Signed)
 Electrophysiology Office Note:    Date:  06/17/2023   [name], DOB 1966-02-22, MRN [id number]  PCP:  Kristian Covey, MD   [facility] Providers Cardiologist:  Armanda Magic, MD Electrophysiologist:  Maurice Small, MD  Sleep Medicine:  Armanda Magic, MD     Referring MD: Kristian Covey, MD   History of Present Illness:    Harold Park is a 58 y.o. male with a medical history significant for recurrent admissions for atrial flutter and fibrillation, congestive heart failure with reduced ejection fraction who presents for EP follow-up.     According to previous notes, he was first diagnosed with paroxysmal atrial flutter in November 2011.  His ejection fraction has been depressed with intermittent partial recovery for over a decade, was 30 to 35% in November 2011.  He was diagnosed with atrial fibrillation in April 2017 and converted to sinus rhythm prior to planned cardioversion.  He first came to [facility] health in May 2017.  At that time he was started on Entresto.  Sleep study showed severe sleep apnea.  He was seen by Dr. Graciela Husbands for ICD consideration. Holter monitor showed 6.3% PVCs, and his MRI did not show any LGE. ICD was deferred due to a possible reversible cause, and Ranexa was tried to suppress his PVCs.   He tried amiodarone but has not been able to tolerate it due to severe nausea.    He underwent DC cardioversion in April 2019 for recurrent AF and was seen in AF clinic days later but felt not to be a good candidate for ablation at the time due to his obesity (~330 lbs at the time) and LA dimension of 51 cm.  Continue to have recurrences of atrial fibrillation flutter and was again seen in A-fib clinic on August 2 and considered for Tikosyn load.  However, he has had recurrent admissions for AF and severe acute CHF and cardiogenic shock for which he received IV amiodarone loads. On at least one occasion, he presented with a very wide QRS rhythm, likely  due to antiarrhythmic drug effects.  He was able to maintain sinus rhythm on amiodarone, which he had tolerated poorly in the past.  He underwent ablation for atrial fibrillation on October 9 with pulsed with ablation of the pulmonary veins and posterior wall of the left atrium.  Additionally, a CTI line was performed.  During the ablation he converted to an atypical flutter that broke during ablation of the posterior wall.      Amiodarone was discontinued during the last visit.  According to his Apple Watch, he has not had any recurrence of AF.  We discussed the indication for ICD previously, but he was hesitant to proceed.  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  EKGs/Labs/Other Studies Reviewed Today:    Echocardiogram:  Echocardiogram October 28, 2022 LVEF 25 to 30%.  Severe LV dilation.  Left atrium severely dilated, 5.2 cm; right atrium moderately dilated.   Monitors:   Stress testing:   Advanced imaging:  CTA coronary December 2021 0 Agastson units  Cardiac MRI February 2025 LVEF 28%.  Moderately dilated RV EF 33%.  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  Cardiac catherization   EKG:   EKG Interpretation Date/Time:  Wednesday June 17 2023 10:10:07 EDT Ventricular Rate:  95 PR Interval:  228 QRS Duration:  138 QT Interval:  382 QTC Calculation: 480 R Axis:   194  Text Interpretation: Sinus rhythm with 1st degree A-V block with Fusion complexes Right superior axis deviation Non-specific intra-ventricular conduction block When compared with ECG of 09-Mar-2023 10:26, Fusion complexes are now Present Confirmed by York Pellant [phone]) on 06/17/2023 10:45:45 AM     Physical Exam:    VS:  BP 102/78   Pulse 95   Ht 6\' 3"  (1.905 m)   Wt (!) 307 lb (139.3 kg)   SpO2 96%   BMI 38.37 kg/m     Wt Readings from Last 3 Encounters:   06/17/23 (!) 307 lb (139.3 kg)  05/11/23 (!) 300 lb 6.4 oz (136.3 kg)  03/09/23 298 lb 6.4 oz (135.4 kg)     GEN: Well nourished, well developed in no acute distress CARDIAC: RRR, no murmurs, rubs, gallops RESPIRATORY:  Normal work of breathing MUSCULOSKELETAL: no edema    ASSESSMENT & PLAN:     Persistent atrial fibrillation Has difficult to control RVR, resulting in multiple CHF admissions Status post ablation December 17, 2022 --maintaining sinus rhythm still on amiodarone Encouraged him to monitor his heart rate regularly and report rapid rates or symptoms of heart failure No AF detections on his apple watch  Atrial flutter -- appearing both typical and atypical S/p ablation of the CTI and posterior wall of the LA  -- maintaining sinus rhythm  CHFrEF Appears euvolemic and compensated today Repeat TTE shows persistently decreased EF (25-30%) despite 90 days of GDMT and rhythm control CMR shows depressed EF and LGE; I again recommended ICD LGE pattern is concerning for sarcoid; He has follow-up with Dr. Gala Romney within a few weeks. Continue spironolactone 25 torsemide 20, empagliflozin 10, digoxin 0.125  Secondary hypercoagulable state Continue Eliquis 5 g twice daily  Obesity Currently at 298 pounds.   We discussed the importance of weight loss for maintenance of sinus rhythm    Signed, Maurice Small, MD  06/17/2023 10:51 AM    [facility]

## 2023-06-17 NOTE — Patient Instructions (Signed)
 Medication Instructions:  Your physician recommends that you continue on your current medications as directed. Please refer to the Current Medication list given to you today.  *If you need a refill on your cardiac medications before your next appointment, please call your pharmacy*  Follow-Up: At [facility], you and your health needs are our priority.  As part of our continuing mission to provide you with exceptional heart care, our providers are all part of one team.  This team includes your primary Cardiologist (physician) and Advanced Practice Providers or APPs (Physician Assistants and Nurse Practitioners) who all work together to provide you with the care you need, when you need it.  Your next appointment:   6 months  Provider:   You will see one of the following Advanced Practice Providers on your designated Care Team:   Francis Dowse, [place] Casimiro Needle "Mardelle Matte" [place], PA-C Sherie Don, NP Canary Brim, NP        1st Floor: - Lobby - Registration  - Pharmacy  - Lab - Cafe  2nd Floor: - PV Lab - Diagnostic Testing (echo, CT, nuclear med)  3rd Floor: - Vacant  4th Floor: - TCTS (cardiothoracic surgery) - AFib Clinic - Structural Heart Clinic - Vascular Surgery  - Vascular Ultrasound  5th Floor: - HeartCare Cardiology (general and EP) - Clinical Pharmacy for coumadin, hypertension, lipid, weight-loss medications, and med management appointments    Valet parking services will be available as well.

## 2023-06-23 ENCOUNTER — Other Ambulatory Visit (HOSPITAL_COMMUNITY): Payer: Self-pay

## 2023-06-24 ENCOUNTER — Other Ambulatory Visit (HOSPITAL_COMMUNITY): Payer: Self-pay

## 2023-06-24 ENCOUNTER — Other Ambulatory Visit: Payer: Self-pay

## 2023-06-25 ENCOUNTER — Other Ambulatory Visit (HOSPITAL_COMMUNITY): Payer: Self-pay

## 2023-06-25 ENCOUNTER — Other Ambulatory Visit: Payer: Self-pay

## 2023-06-26 ENCOUNTER — Other Ambulatory Visit: Payer: Self-pay

## 2023-06-26 ENCOUNTER — Other Ambulatory Visit (HOSPITAL_COMMUNITY): Payer: Self-pay

## 2023-06-26 ENCOUNTER — Other Ambulatory Visit (HOSPITAL_BASED_OUTPATIENT_CLINIC_OR_DEPARTMENT_OTHER): Payer: Self-pay | Admitting: Internal Medicine

## 2023-06-29 ENCOUNTER — Other Ambulatory Visit (HOSPITAL_COMMUNITY): Payer: Self-pay

## 2023-06-29 MED ORDER — DIGOXIN 125 MCG PO TABS
0.1250 mg | ORAL_TABLET | Freq: Every day | ORAL | 5 refills | Status: DC
  Filled 2023-06-30: qty 30, 30d supply, fill #0
  Filled 2023-08-05: qty 30, 30d supply, fill #1
  Filled 2023-08-31: qty 30, 30d supply, fill #2
  Filled 2023-10-25: qty 30, 30d supply, fill #3
  Filled 2023-11-26: qty 30, 30d supply, fill #4

## 2023-06-30 ENCOUNTER — Other Ambulatory Visit (HOSPITAL_COMMUNITY): Payer: Self-pay

## 2023-06-30 ENCOUNTER — Encounter (HOSPITAL_COMMUNITY): Payer: Self-pay

## 2023-07-07 ENCOUNTER — Other Ambulatory Visit (HOSPITAL_COMMUNITY): Payer: Self-pay

## 2023-07-07 DIAGNOSIS — I5022 Chronic systolic (congestive) heart failure: Secondary | ICD-10-CM

## 2023-07-07 MED ORDER — EMPAGLIFLOZIN 10 MG PO TABS
10.0000 mg | ORAL_TABLET | Freq: Every day | ORAL | 5 refills | Status: DC
  Filled 2023-07-07: qty 30, 30d supply, fill #0
  Filled 2023-08-05: qty 30, 30d supply, fill #1
  Filled 2023-08-31: qty 30, 30d supply, fill #2
  Filled 2023-10-25: qty 30, 30d supply, fill #3
  Filled 2023-11-26: qty 30, 30d supply, fill #4

## 2023-07-17 ENCOUNTER — Encounter (HOSPITAL_COMMUNITY): Admitting: Internal Medicine

## 2023-07-22 ENCOUNTER — Other Ambulatory Visit (HOSPITAL_COMMUNITY): Payer: Self-pay | Admitting: Internal Medicine

## 2023-07-29 ENCOUNTER — Other Ambulatory Visit: Payer: Self-pay | Admitting: Family Medicine

## 2023-07-29 DIAGNOSIS — E669 Obesity, unspecified: Secondary | ICD-10-CM

## 2023-08-04 ENCOUNTER — Other Ambulatory Visit (HOSPITAL_COMMUNITY): Payer: Self-pay

## 2023-08-04 MED ORDER — ZEPBOUND 10 MG/0.5ML ~~LOC~~ SOAJ
10.0000 mg | SUBCUTANEOUS | 11 refills | Status: AC
Start: 2023-08-04 — End: ?
  Filled 2023-08-04: qty 2, 28d supply, fill #0
  Filled 2023-08-31: qty 2, 28d supply, fill #1
  Filled 2023-09-27: qty 2, 28d supply, fill #2
  Filled 2023-10-25: qty 2, 28d supply, fill #3

## 2023-08-05 ENCOUNTER — Other Ambulatory Visit: Payer: Self-pay

## 2023-08-05 ENCOUNTER — Other Ambulatory Visit (HOSPITAL_COMMUNITY): Payer: Self-pay

## 2023-08-05 ENCOUNTER — Other Ambulatory Visit: Payer: Self-pay | Admitting: Family Medicine

## 2023-08-05 MED ORDER — TORSEMIDE 20 MG PO TABS
40.0000 mg | ORAL_TABLET | Freq: Two times a day (BID) | ORAL | 0 refills | Status: DC
  Filled 2023-08-05 – 2023-09-27 (×3): qty 120, 30d supply, fill #0
  Filled 2023-10-25: qty 120, 30d supply, fill #1
  Filled 2023-11-26: qty 120, 30d supply, fill #2

## 2023-08-06 ENCOUNTER — Other Ambulatory Visit (HOSPITAL_COMMUNITY): Payer: Self-pay

## 2023-08-06 ENCOUNTER — Encounter (HOSPITAL_COMMUNITY): Payer: Self-pay | Admitting: Internal Medicine

## 2023-08-06 ENCOUNTER — Ambulatory Visit (HOSPITAL_COMMUNITY)
Admission: RE | Admit: 2023-08-06 | Discharge: 2023-08-06 | Disposition: A | Discharge status: Home / Self Care | Source: Ambulatory Visit | Attending: Internal Medicine | Admitting: Internal Medicine

## 2023-08-06 VITALS — BP 118/84 | HR 89 | Ht 75.0 in | Wt 303.4 lb

## 2023-08-06 DIAGNOSIS — Z6837 Body mass index (BMI) 37.0-37.9, adult: Secondary | ICD-10-CM | POA: Diagnosis not present

## 2023-08-06 DIAGNOSIS — Z7901 Long term (current) use of anticoagulants: Secondary | ICD-10-CM | POA: Diagnosis not present

## 2023-08-06 DIAGNOSIS — Z7984 Long term (current) use of oral hypoglycemic drugs: Secondary | ICD-10-CM | POA: Diagnosis not present

## 2023-08-06 DIAGNOSIS — Z79899 Other long term (current) drug therapy: Secondary | ICD-10-CM | POA: Insufficient documentation

## 2023-08-06 DIAGNOSIS — N182 Chronic kidney disease, stage 2 (mild): Secondary | ICD-10-CM | POA: Diagnosis not present

## 2023-08-06 DIAGNOSIS — R350 Frequency of micturition: Secondary | ICD-10-CM

## 2023-08-06 DIAGNOSIS — G4733 Obstructive sleep apnea (adult) (pediatric): Secondary | ICD-10-CM | POA: Insufficient documentation

## 2023-08-06 DIAGNOSIS — F32A Depression, unspecified: Secondary | ICD-10-CM | POA: Insufficient documentation

## 2023-08-06 DIAGNOSIS — I4892 Unspecified atrial flutter: Secondary | ICD-10-CM | POA: Insufficient documentation

## 2023-08-06 DIAGNOSIS — E1122 Type 2 diabetes mellitus with diabetic chronic kidney disease: Secondary | ICD-10-CM | POA: Diagnosis not present

## 2023-08-06 DIAGNOSIS — E669 Obesity, unspecified: Secondary | ICD-10-CM | POA: Diagnosis not present

## 2023-08-06 DIAGNOSIS — I5022 Chronic systolic (congestive) heart failure: Secondary | ICD-10-CM | POA: Insufficient documentation

## 2023-08-06 DIAGNOSIS — Z006 Encounter for examination for normal comparison and control in clinical research program: Secondary | ICD-10-CM | POA: Diagnosis not present

## 2023-08-06 DIAGNOSIS — I428 Other cardiomyopathies: Secondary | ICD-10-CM | POA: Diagnosis not present

## 2023-08-06 DIAGNOSIS — I480 Paroxysmal atrial fibrillation: Secondary | ICD-10-CM | POA: Diagnosis not present

## 2023-08-06 DIAGNOSIS — R3915 Urgency of urination: Secondary | ICD-10-CM | POA: Insufficient documentation

## 2023-08-06 DIAGNOSIS — I493 Ventricular premature depolarization: Secondary | ICD-10-CM | POA: Insufficient documentation

## 2023-08-06 DIAGNOSIS — I130 Hypertensive heart and chronic kidney disease with heart failure and stage 1 through stage 4 chronic kidney disease, or unspecified chronic kidney disease: Secondary | ICD-10-CM | POA: Insufficient documentation

## 2023-08-06 LAB — CBC
HCT: 56.1 % — ABNORMAL HIGH (ref 39.0–52.0)
Hemoglobin: 19 g/dL — ABNORMAL HIGH (ref 13.0–17.0)
MCH: 29.8 pg (ref 26.0–34.0)
MCHC: 33.9 g/dL (ref 30.0–36.0)
MCV: 88.1 fL (ref 80.0–100.0)
Platelets: 276 10*3/uL (ref 150–400)
RBC: 6.37 MIL/uL — ABNORMAL HIGH (ref 4.22–5.81)
RDW: 13.5 % (ref 11.5–15.5)
WBC: 13.1 10*3/uL — ABNORMAL HIGH (ref 4.0–10.5)
nRBC: 0 % (ref 0.0–0.2)

## 2023-08-06 LAB — COMPREHENSIVE METABOLIC PANEL WITH GFR
ALT: 29 U/L (ref 0–44)
AST: 28 U/L (ref 15–41)
Albumin: 4 g/dL (ref 3.5–5.0)
Alkaline Phosphatase: 50 U/L (ref 38–126)
Anion gap: 10 (ref 5–15)
BUN: 17 mg/dL (ref 6–20)
CO2: 24 mmol/L (ref 22–32)
Calcium: 9.2 mg/dL (ref 8.9–10.3)
Chloride: 104 mmol/L (ref 98–111)
Creatinine, Ser: 1.31 mg/dL — ABNORMAL HIGH (ref 0.61–1.24)
GFR, Estimated: >60 mL/min (ref >60)
Glucose, Bld: 102 mg/dL — ABNORMAL HIGH (ref 70–99)
Potassium: 3.8 mmol/L (ref 3.5–5.1)
Sodium: 138 mmol/L (ref 135–145)
Total Bilirubin: 0.8 mg/dL (ref 0.0–1.2)
Total Protein: 7.5 g/dL (ref 6.5–8.1)

## 2023-08-06 LAB — BRAIN NATRIURETIC PEPTIDE: B Natriuretic Peptide: 36.5 pg/mL (ref 0.0–100.0)

## 2023-08-06 MED ORDER — TAMSULOSIN HCL 0.4 MG PO CAPS: 0.4000 mg | ORAL_CAPSULE | Freq: Every day | ORAL | 6 refills | Status: DC

## 2023-08-06 MED ORDER — LOSARTAN POTASSIUM 25 MG PO TABS: 25.0000 mg | ORAL_TABLET | Freq: Every day | ORAL | 6 refills | Status: DC

## 2023-08-06 NOTE — Patient Instructions (Signed)
 Medication Changes:  START Losartan  25 mg Daily  START Flomax 0.4 mg Daily with supper (in evening)  Lab Work:  Labs done today, your results will be available in MyChart, we will contact you for abnormal readings.   Testing/Procedures:  Your physician has requested that you have an echocardiogram. Echocardiography is a painless test that uses sound waves to create images of your heart. It provides your doctor with information about the size and shape of your heart and how well your heart's chambers and valves are working. This procedure takes approximately one hour. There are no restrictions for this procedure. Please do NOT wear cologne, perfume, aftershave, or lotions (deodorant is allowed). Please arrive 15 minutes prior to your appointment time.  Please note: We ask at that you not bring children with you during ultrasound (echo/ vascular) testing. Due to room size and safety concerns, children are not allowed in the ultrasound rooms during exams. Our front office staff cannot provide observation of children in our lobby area while testing is being conducted. An adult accompanying a patient to their appointment will only be allowed in the ultrasound room at the discretion of the ultrasound technician under special circumstances. We apologize for any inconvenience.  Special Instructions // Education:  Do the following things EVERYDAY: Weigh yourself in the morning before breakfast. Write it down and keep it in a log. Take your medicines as prescribed Eat low salt foods--Limit salt (sodium) to 2000 mg per day.  Stay as active as you can everyday Limit all fluids for the day to less than 2 liters   Follow-Up in: 6 months (November), **PLEASE CALL OUR OFFICE IN SEPTEMBER TO SCHEDULE   At the [facility], you and your health needs are our priority. We have a designated team specialized in the treatment of Heart Failure. This Care Team includes your primary Heart  Failure Specialized Cardiologist (physician), Advanced Practice Providers (APPs- Physician Assistants and Nurse Practitioners), and Pharmacist who all work together to provide you with the care you need, when you need it.   You may see any of the following providers on your designated Care Team at your next follow up:  Dr. Jules Oar Dr. Peder Bourdon Dr. Alwin Baars Dr. Judyth Nunnery Nieves Bars, NP Ruddy Corral, [place] [facility] [place], [place] Dennise Fitz, NP [place] Lee, NP Luster Salters, PharmD   Please be sure to bring in all your medications bottles to every appointment.   Need to Contact Us :  If you have any questions or concerns before your next appointment please send us  a message through [place] or call our office at [phone].    TO LEAVE A MESSAGE FOR THE NURSE SELECT OPTION 2, PLEASE LEAVE A MESSAGE INCLUDING: YOUR NAME DATE OF BIRTH CALL BACK NUMBER REASON FOR CALL**this is important as we prioritize the call backs  YOU WILL RECEIVE A CALL BACK THE SAME DAY AS LONG AS YOU CALL BEFORE 4:00 PM

## 2023-08-06 NOTE — Progress Notes (Signed)
 [facility] Note  PCP: Marquetta Sit, MD HF Cardiologist: Dr. Julane Ny  HPI:  Art is a 58 y.o. male with history of NICM/HFrEF, PAF/AFL, PVCs on Ranexa , severe OSA, pulmonary sarcoid, obesity, HTN.   Echo 2011: EF 30-35%. Cath 2011 with normal cors. Echo 10/17: EF 35-40%.   Underwent DC-CV on 06/19/17 for recurrent AF Seen in AF Clinic on 07/02/17. Felt not to be ideal candidate for AF ablation   Echo 12/21 EF 40-45%  CT angio in 2019 aortic root was 4.6 cm CT chest 03/06/20 to evaluate hilar fullness seen on CXR. Interval development of numerous enlarged mediastinal and bilateral hilar lymph nodes. Underwent bronchoscopy with biopsy of mediastinal nodes. 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 Treated with prednisone .  CT coronary 03/06/20. Calcium  score 0. Poor arterial imaging but no obvious CAD   Follow up 7/24, back in AFL. Diuretics increased, sp DCCV to NSR 7/225/24. Follow up in AF clinic, back in AFL with RVR. Ranexa  stopped in anticipation of Tikosyn loading.    Admitted 8/24 with AF with RVR. He was sedated and underwent cardioversion in the ED. Subsequently developed shock. Bedside POCUS with EF 15%. Echo with EF 25-30%, RV moderately down. Had recurrent Afib with RVR and PVCs. DCCV attempted X 2 on 10/19/22. Amiodarone  load resumed and had repeat DCCV on 10/23/22 with conversion to SR.   Readmitted 10/27/22 with recurrent AFL with RVR and acute on chronic CHF with low-output. Converted to SR with IV amiodarone . He required inotrope support with milrinone . RHC on milrinone  with elevated filling pressures and low-output Fick CI 2.1 and TD CI 1.7. He was diuresed with IV lasix  and metolazone . Course further complicated by AKI on CKD and PNA, for which he was treated with antibiotics.   Recurrent atrial flutter, and underwent DCCV to SR on 11/13/22.   S/p AFL ablation 10/24.  Echo 12/24 EF 25-30% RV ok.   Today he returns for HF follow up.Feels ok. 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 No edema, orthopnea or PND. Recently saw Dr. Arlester Ladd and decided to defer ICD for now. Wearing CPAP. Now back on Zepbound    ROS: All systems negative except as listed in HPI, PMH and Problem List.  SH:  Social History   Socioeconomic History   Marital status: Married    Spouse name: Not on file   Number of children: 3   Years of education: Not on file   Highest education level: Not on file  Occupational History   Occupation: realtor  Tobacco Use   Smoking status: Never   Smokeless tobacco: Never  Vaping Use   Vaping status: Never Used  Substance and Sexual Activity   Alcohol use: Yes    Comment: occasional   Drug use: Never   Sexual activity: Not on file  Other Topics Concern   Not on file  Social History Narrative   Not on file   Social Drivers of Health   Financial Resource Strain: Not on file  Food Insecurity: No Food Insecurity (11/11/2022)   Hunger Vital Sign    Worried About Running Out of Food in the Last Year: Never true    Ran Out of Food in the Last Year: Never true  Transportation Needs: No Transportation Needs (11/11/2022)   PRAPARE - Administrator, Civil Service (Medical): No    Lack of Transportation (Non-Medical): No  Physical Activity: Not on file  Stress:  Not on file  Social Connections: Not on file  Intimate Partner Violence: Not At Risk (11/05/2022)   Humiliation, Afraid, Rape, and Kick questionnaire    Fear of Current or Ex-Partner: No    Emotionally Abused: No    Physically Abused: No    Sexually Abused: No   FH:  Family History  Problem Relation Age of Onset   Breast cancer Mother    Cancer Father        blood cancer   Hypertension Other    Heart disease Other    Stomach cancer Neg Hx    Colon cancer Neg Hx    Esophageal cancer Neg Hx    Pancreatic cancer Neg Hx    Rectal cancer Neg Hx     Past Medical History:  Diagnosis Date   Chronic bronchitis    HTN  (hypertension)    NICM (nonischemic cardiomyopathy) (HCC)    Obesity (BMI 30-39.9) 12/04/2015   OSA (obstructive sleep apnea) 08/19/2015   Severe with AHI 64/hr now on CPAP at 13cm H2O   PAF (paroxysmal atrial fibrillation) (HCC)    Systolic CHF, chronic (HCC)     Current Outpatient Medications  Medication Sig Dispense Refill   albuterol  (VENTOLIN  HFA) 108 (90 Base) MCG/ACT inhaler TAKE 2 PUFFS BY MOUTH EVERY 6 HOURS AS NEEDED FOR WHEEZE OR SHORTNESS OF BREATH 8.5 each 2   apixaban  (ELIQUIS ) 5 MG TABS tablet TAKE 1 TABLET BY MOUTH TWICE A DAY 180 tablet 3   digoxin  (LANOXIN ) 0.125 MG tablet Take 1 tablet (0.125 mg total) by mouth daily. 30 tablet 5   DULoxetine  (CYMBALTA ) 20 MG capsule TAKE 1 CAPSULE BY MOUTH EVERY DAY 90 capsule 3   empagliflozin  (JARDIANCE ) 10 MG TABS tablet Take 1 tablet (10 mg total) by mouth daily. 30 tablet 5   Magnesium 250 MG TABS Take 250 mg by mouth every evening. Gummy     ondansetron  (ZOFRAN ) 4 MG tablet Take 1 tablet (4 mg total) by mouth every 8 (eight) hours as needed for nausea or vomiting. 60 tablet 1   potassium chloride  SA (KLOR-CON  M) 20 MEQ tablet Take 2 tablets (40 mEq total) by mouth 2 (two) times daily. 360 tablet 3   spironolactone  (ALDACTONE ) 25 MG tablet Take 1 tablet (25 mg total) by mouth daily. 30 tablet 5   tirzepatide  (ZEPBOUND ) 10 MG/0.5ML Pen Inject 10 mg into the skin once a week. 2 mL 11   torsemide  (DEMADEX ) 20 MG tablet Take 2 tablets (40 mg total) by mouth 2 (two) times daily. 360 tablet 0   No current facility-administered medications for this encounter.    Wt Readings from Last 3 Encounters:  08/06/23 (!) 137.6 kg (303 lb 6.4 oz)  06/17/23 (!) 139.3 kg (307 lb)  05/11/23 (!) 136.3 kg (300 lb 6.4 oz)   BP 118/84   Pulse 89   Ht 6\' 3"  (1.905 m)   Wt (!) 137.6 kg (303 lb 6.4 oz)   SpO2 96%   BMI 37.92 kg/m   PHYSICAL EXAM: General:  Well appearing. No resp difficulty HEENT: normal Neck: supple. no JVD. Carotids 2+ bilat;  no bruits. No lymphadenopathy or thryomegaly appreciated. Cor: PMI nondisplaced. Regular rate & rhythm. No rubs, gallops or murmurs. Lungs: clear Abdomen: obese soft, nontender, nondistended. No hepatosplenomegaly. No bruits or masses. Good bowel sounds. Extremities: no cyanosis, clubbing, rash, edema Neuro: alert & orientedx3, cranial nerves grossly intact. moves all 4 extremities w/o difficulty. Affect pleasant   ECG (personally reviewed):not  done today  ASSESSMENT & PLAN:   1. 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 Cath 2011 with normal cors. - Echo 10/17: EF 35-40%. - CMRI 2017 LVEF 20%. No LGE - Echo 12/21 EF 40-45%. Coronary CTA normal cors, calcium  score 0   - Cardiac PET 2023- no evidence for cardiac sarcoid or inflammatory. LVEF 38%.   - Admit 10/14/22, developed cardiogenic shock post DCCV. Echo EF 20-25% and RV moderately reduced - Readmitted 10/27/22 w/ ADHF w/ low-output in setting of recurrent AFL w/ RVR. Required inotrope support with milrinone  - RHC on 10/30/22 with: RA 21, PA 56/30 (42), PCWP 30 v waves to 55, Fick CO/CI 5.9/2.1, TD CO/CI 4.6/1.7. PAPi 1.2 - Echo 12/24 EF 25-30% RV ok Personally reviewed - NYHA II  - Volume ok  - Continue torsemide  40 bid + 40 KCL bid - Continue spiro 25 mg daily - Continue Jardiance  10 mg daily - Continue digoxin  0.125 mg daily, needs dig trough soon (will ask AF clinic if this can be drawn at his follow up 01/20/23) - Off Entresto  due to low BP. Start losartan  25 - No beta blocker with recent low-output. Consider adding back next. - Due for repeat echo -> if EF still < 35% strongly recommend ICD - Labs today  2. Recurrent AFL and Afib with RVR - Multiple admissions with RVR leading to decompensated HF w/ low-output. - s/p AFL ablation 10/24 - Remains in NSR. Off amio  - Continue Eliquis    3. CKD II-III - Baseline SCr ~ 1.2,  - Continue Jardiance  - Labs today    4. OSA  - Continue CPAP  nightly. - No change    5. Type 2 DM -  A1c 5.7%. on 3/25 - Continue Jardiance   6. Obesity - Body mass index is 37.92 kg/m. - Now on tirzepatide .  7. Depression - can restart Cymbalta  - he will talk with PCP - (Ok from cardiac perspective)  8. Urinary urgency - trial flomax   Jules Oar, MD  3:07 PM

## 2023-08-07 ENCOUNTER — Ambulatory Visit (HOSPITAL_COMMUNITY)
Admission: RE | Admit: 2023-08-07 | Discharge: 2023-08-07 | Disposition: A | Discharge status: Home / Self Care | Source: Ambulatory Visit | Attending: Internal Medicine | Admitting: Internal Medicine

## 2023-08-07 DIAGNOSIS — I4892 Unspecified atrial flutter: Secondary | ICD-10-CM | POA: Insufficient documentation

## 2023-08-07 DIAGNOSIS — R000 Tachycardia, unspecified: Secondary | ICD-10-CM | POA: Diagnosis not present

## 2023-08-07 DIAGNOSIS — I5022 Chronic systolic (congestive) heart failure: Secondary | ICD-10-CM | POA: Insufficient documentation

## 2023-08-07 DIAGNOSIS — E119 Type 2 diabetes mellitus without complications: Secondary | ICD-10-CM | POA: Insufficient documentation

## 2023-08-07 DIAGNOSIS — I10 Essential (primary) hypertension: Secondary | ICD-10-CM | POA: Diagnosis not present

## 2023-08-07 DIAGNOSIS — G4733 Obstructive sleep apnea (adult) (pediatric): Secondary | ICD-10-CM | POA: Diagnosis not present

## 2023-08-07 DIAGNOSIS — I340 Nonrheumatic mitral (valve) insufficiency: Secondary | ICD-10-CM | POA: Insufficient documentation

## 2023-08-07 DIAGNOSIS — I4891 Unspecified atrial fibrillation: Secondary | ICD-10-CM | POA: Insufficient documentation

## 2023-08-07 DIAGNOSIS — G4730 Sleep apnea, unspecified: Secondary | ICD-10-CM | POA: Diagnosis not present

## 2023-08-07 DIAGNOSIS — I493 Ventricular premature depolarization: Secondary | ICD-10-CM | POA: Insufficient documentation

## 2023-08-07 LAB — ECHOCARDIOGRAM COMPLETE
AR max vel: 2.15 cm2
AV Area VTI: 2.63 cm2
AV Area mean vel: 1.79 cm2
AV Mean grad: 4 mmHg
AV Peak grad: 5.9 mmHg
Ao pk vel: 1.21 m/s
Area-P 1/2: 8.25 cm2
Calc EF: 39.7 %
MV M vel: 3.88 m/s
MV Peak grad: 60.2 mmHg
MV VTI: 2.39 cm2
S' Lateral: 5.6 cm
Single Plane A2C EF: 39.3 %
Single Plane A4C EF: 39.9 %

## 2023-08-07 NOTE — Progress Notes (Signed)
  Echocardiogram 2D Echocardiogram has been performed.  Konrad Hoak L Cheree Fowles RDCS 08/07/2023, 9:42 AM

## 2023-08-10 ENCOUNTER — Telehealth: Payer: Self-pay

## 2023-08-10 NOTE — Telephone Encounter (Signed)
 Called re: PREP program referral; he has completed CR earlier this year; wants to take July class at [facility], will contact mid June to confirm date/time and set up assessment visit.

## 2023-08-13 ENCOUNTER — Ambulatory Visit (HOSPITAL_COMMUNITY): Payer: Self-pay | Admitting: Internal Medicine

## 2023-08-13 DIAGNOSIS — D751 Secondary polycythemia: Secondary | ICD-10-CM

## 2023-08-31 ENCOUNTER — Other Ambulatory Visit (HOSPITAL_COMMUNITY): Payer: Self-pay

## 2023-08-31 ENCOUNTER — Other Ambulatory Visit: Payer: Self-pay

## 2023-09-03 ENCOUNTER — Telehealth: Payer: Self-pay

## 2023-09-03 NOTE — Telephone Encounter (Signed)
 Called to confirm participation in next PREP class at [facility] on July 8, left voicemail requesting return call.

## 2023-09-07 ENCOUNTER — Telehealth: Payer: Self-pay

## 2023-09-07 DIAGNOSIS — G4733 Obstructive sleep apnea (adult) (pediatric): Secondary | ICD-10-CM | POA: Diagnosis not present

## 2023-09-07 NOTE — Telephone Encounter (Signed)
Called again re: PREP program referral, left voicemail requesting return call

## 2023-09-23 ENCOUNTER — Ambulatory Visit (INDEPENDENT_AMBULATORY_CARE_PROVIDER_SITE_OTHER): Admitting: Family Medicine

## 2023-09-23 ENCOUNTER — Encounter: Payer: Self-pay | Admitting: Family Medicine

## 2023-09-23 VITALS — BP 100/68 | HR 61 | Temp 98.1°F | Wt 303.0 lb

## 2023-09-23 DIAGNOSIS — U071 COVID-19: Secondary | ICD-10-CM | POA: Diagnosis not present

## 2023-09-23 DIAGNOSIS — R0981 Nasal congestion: Secondary | ICD-10-CM

## 2023-09-23 LAB — POCT RAPID STREP A (OFFICE): Rapid Strep A Screen: NEGATIVE — NL

## 2023-09-23 LAB — POC COVID19 BINAXNOW: SARS Coronavirus 2 Ag: POSITIVE — AB

## 2023-09-23 MED ORDER — NIRMATRELVIR/RITONAVIR (PAXLOVID) TABLET (RENAL DOSING)
2.0000 | ORAL_TABLET | Freq: Two times a day (BID) | ORAL | 0 refills | Status: AC
Start: 2023-09-23 — End: 2023-09-28

## 2023-09-23 NOTE — Progress Notes (Signed)
   Subjective:    Patient ID: Harold Park, male    DOB: 13-Aug-1965, 58 y.o.   MRN: [id number]  HPI Here for 3 days of stuffy head, ST, and low grade fever. No cough or SOB. Drinking fluids and taking Nyquil and Dayquil.    Review of Systems  Constitutional:  Positive for fatigue and fever.  HENT:  Positive for congestion, postnasal drip and sore throat. Negative for ear pain and sinus pain.   Eyes: Negative.   Respiratory: Negative.    Cardiovascular: Negative.   Gastrointestinal: Negative.        Objective:   Physical Exam Constitutional:      Appearance: Normal appearance.  HENT:     Right Ear: Tympanic membrane, ear canal and external ear normal.     Left Ear: Tympanic membrane, ear canal and external ear normal.     Nose: Nose normal.     Mouth/Throat:     Pharynx: Oropharynx is clear.  Eyes:     Conjunctiva/sclera: Conjunctivae normal.  Cardiovascular:     Rate and Rhythm: Normal rate and regular rhythm.     Pulses: Normal pulses.     Heart sounds: Normal heart sounds.  Pulmonary:     Effort: Pulmonary effort is normal.     Breath sounds: Normal breath sounds.  Neurological:     Mental Status: He is alert.           Assessment & Plan:  Covid infection. We will treat with 5 days of Paxlovid  (using the renal dose because he takes Eliquis ). Recheck as needed.  Garnette Olmsted, MD

## 2023-09-28 ENCOUNTER — Other Ambulatory Visit (HOSPITAL_COMMUNITY): Payer: Self-pay

## 2023-10-02 ENCOUNTER — Other Ambulatory Visit: Payer: Self-pay

## 2023-10-02 DIAGNOSIS — D751 Secondary polycythemia: Secondary | ICD-10-CM

## 2023-10-04 NOTE — Progress Notes (Signed)
 [facility]   HEMATOLOGY/ONCOLOGY CLINIC NOTE  Date of Service: 10/05/2023  Patient Care Team: Micheal [name], MD as PCP - General (Family Medicine) Shlomo Wilbert SAUNDERS, MD as PCP - Sleep Medicine (Sleep Medicine) Shlomo Wilbert SAUNDERS, MD as PCP - Cardiology (Cardiology) [name], [name], MD as PCP - Electrophysiology (Cardiology) Jolaine Rankin SQUIBB, MD as Consulting Physician (Internal Medicine) [name], MD as Consulting Physician (Pulmonary Disease)  CHIEF COMPLAINTS/PURPOSE OF CONSULTATION:  Discussion of lab results for evaluation of polycythemia  HISTORY OF PRESENTING ILLNESS:   Harold Park is a wonderful 58 y.o. male who has been referred to us  by Dr .Micheal, [name], MD for evaluation and management of polycythemia.  Patient has a history of severe obstructive sleep apnea on CPAP, systolic CHF, obesity, nonischemic cardiomyopathy, paroxysmal atrial fibrillation who has had chronic polycythemia for several years.  He had low testosterone  and was started on testosterone  that led to additional bump in his hematocrit in the early part of this year with hemoglobin of 19.6 and hematocrit of just shy of 60%.  His testosterone  was stopped.  Patient notes that he felt more energy while on testosterone .  Patient notes he is keen to get back on testosterone  if possible and would like to work-up his polycythemia to see if this might be possible. Patient notes no history of blood clots.  No history of strokes or heart attacks. His last available labs from 07/03/2021 showed a hemoglobin of 17.7 with hematocrit of 53.7, WBC count of 11.3 with platelets of 229k  He currently notes no other acute new symptoms.  He notes that he has been trying to lose weight and has been on medications for weight loss with some benefits.  He also notes that he is on diuretics and potentially could be somewhat hemoconcentrated.  No fevers no chills no night sweats.  No new bone pains.  Interval  history  Harold Park is a 58 y.o. male who was last seen by me on 10/14/2021 via telemedicine visit for evaluation and management of polycythemia, likely secondary. He was doing well overall with no acute new symptoms.   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 Patient is being considered for possible use of testosterone  for hypogonadism again. Patient notes no new VTE's or stroke since his last clinic visit with us . He has had atrial fibrillation ablation last year.  He continues to be on Eliquis . He has been using Zepbound  to try to lose weight. No other acute new symptoms.   MEDICAL HISTORY:  Past Medical History:  Diagnosis Date   Chronic bronchitis    HTN (hypertension)    NICM (nonischemic cardiomyopathy) (HCC)    Obesity (BMI 30-39.9) 12/04/2015   OSA (obstructive sleep apnea) 08/19/2015   Severe with AHI 64/hr now on CPAP at 13cm H2O   PAF (paroxysmal atrial fibrillation) (HCC)    Systolic CHF, chronic (HCC)     SURGICAL HISTORY: Past Surgical History:  Procedure Laterality Date   APPENDECTOMY     ATRIAL FIBRILLATION ABLATION N/A 12/17/2022   Procedure: ATRIAL FIBRILLATION ABLATION;  Surgeon: Nancey [name], MD;  Location: [facility];  Service: Cardiovascular;  Laterality: N/A;   BRONCHIAL WASHINGS  04/24/2020   Procedure: BRONCHIAL WASHINGS;  Surgeon: [name], MD;  Location: [facility] ENDOSCOPY;  Service: Pulmonary;;   CARDIAC CATHETERIZATION  CARDIOVERSION N/A 06/19/2017   Procedure: CARDIOVERSION;  Surgeon: [name], MD;  Location: [facility] ENDOSCOPY;  Service: Cardiovascular;  Laterality: N/A;   CARDIOVERSION N/A 10/02/2022   Procedure: CARDIOVERSION;  Surgeon: [name], MD;  Location: [facility];  Service: Cardiovascular;  Laterality: N/A;    CARDIOVERSION N/A 10/20/2022   Procedure: CARDIOVERSION;  Surgeon: [name], MD;  Location: [facility];  Service: Cardiovascular;  Laterality: N/A;   CARDIOVERSION N/A 10/23/2022   Procedure: CARDIOVERSION;  Surgeon: [name], MD;  Location: [facility];  Service: Cardiovascular;  Laterality: N/A;   CARDIOVERSION N/A 11/13/2022   Procedure: CARDIOVERSION;  Surgeon: [name], MD;  Location: [facility];  Service: Cardiovascular;  Laterality: N/A;   FINE NEEDLE ASPIRATION  04/24/2020   Procedure: FINE NEEDLE ASPIRATION (FNA) LINEAR;  Surgeon: [name], MD;  Location: [facility] ENDOSCOPY;  Service: Pulmonary;;   LIPOMA RESECTION     arms   RIGHT HEART CATH N/A 10/30/2022   Procedure: RIGHT HEART CATH;  Surgeon: Gardenia Led, DO;  Location: [facility];  Service: Cardiovascular;  Laterality: N/A;   SPLENECTOMY     SPLIT NIGHT STUDY  08/12/2015   VIDEO BRONCHOSCOPY WITH ENDOBRONCHIAL ULTRASOUND N/A 04/24/2020   Procedure: VIDEO BRONCHOSCOPY WITH ENDOBRONCHIAL ULTRASOUND;  Surgeon: [name], MD;  Location: [facility] ENDOSCOPY;  Service: Pulmonary;  Laterality: N/A;    SOCIAL HISTORY: Social History   Socioeconomic History   Marital status: Married    Spouse name: Not on file   Number of children: 3   Years of education: Not on file   Highest education level: Not on file  Occupational History   Occupation: realtor  Tobacco Use   Smoking status: Never   Smokeless tobacco: Never  Vaping Use   Vaping status: Never Used  Substance and Sexual Activity   Alcohol use: Yes    Comment: occasional   Drug use: Never   Sexual activity: Not on file  Other Topics Concern   Not on file  Social History Narrative   Not on file   Social Drivers of Health   Financial Resource Strain: Not on file  Food Insecurity: No Food Insecurity (11/11/2022)   Hunger Vital Sign    Worried About Running Out of Food in the Last Year: Never true    Ran  Out of Food in the Last Year: Never true  Transportation Needs: No Transportation Needs (11/11/2022)   PRAPARE - Administrator, Civil Service (Medical): No    Lack of Transportation (Non-Medical): No  Physical Activity: Not on file  Stress: Not on file  Social Connections: Not on file  Intimate Partner Violence: Not At Risk (11/05/2022)   Humiliation, Afraid, Rape, and Kick questionnaire    Fear of Current or Ex-Partner: No    Emotionally Abused: No    Physically Abused: No    Sexually Abused: No    FAMILY HISTORY: Family History  Problem Relation Age of Onset   Breast cancer Mother    Cancer Father        blood cancer   Hypertension Other    Heart disease Other    Stomach cancer Neg Hx    Colon cancer Neg Hx    Esophageal cancer Neg Hx    Pancreatic cancer Neg Hx    Rectal cancer Neg Hx     ALLERGIES:  is allergic to bee venom.  MEDICATIONS:  Current Outpatient  Medications  Medication Sig Dispense Refill   albuterol  (VENTOLIN  HFA) 108 (90 Base) MCG/ACT inhaler TAKE 2 PUFFS BY MOUTH EVERY 6 HOURS AS NEEDED FOR WHEEZE OR SHORTNESS OF BREATH 8.5 each 2   apixaban  (ELIQUIS ) 5 MG TABS tablet TAKE 1 TABLET BY MOUTH TWICE A DAY 180 tablet 3   digoxin  (LANOXIN ) 0.125 MG tablet Take 1 tablet (0.125 mg total) by mouth daily. 30 tablet 5   DULoxetine  (CYMBALTA ) 20 MG capsule TAKE 1 CAPSULE BY MOUTH EVERY DAY 90 capsule 3   empagliflozin  (JARDIANCE ) 10 MG TABS tablet Take 1 tablet (10 mg total) by mouth daily. 30 tablet 5   losartan  (COZAAR ) 25 MG tablet Take 1 tablet (25 mg total) by mouth daily. 30 tablet 6   Magnesium 250 MG TABS Take 250 mg by mouth every evening. Gummy     ondansetron  (ZOFRAN ) 4 MG tablet Take 1 tablet (4 mg total) by mouth every 8 (eight) hours as needed for nausea or vomiting. 60 tablet 1   potassium chloride  SA (KLOR-CON  M) 20 MEQ tablet Take 2 tablets (40 mEq total) by mouth 2 (two) times daily. 360 tablet 3   spironolactone  (ALDACTONE ) 25 MG  tablet Take 1 tablet (25 mg total) by mouth daily. 30 tablet 5   tamsulosin  (FLOMAX ) 0.4 MG CAPS capsule Take 1 capsule (0.4 mg total) by mouth daily after supper. 30 capsule 6   tirzepatide  (ZEPBOUND ) 10 MG/0.5ML Pen Inject 10 mg into the skin once a week. 2 mL 11   torsemide  (DEMADEX ) 20 MG tablet Take 2 tablets (40 mg total) by mouth 2 (two) times daily. 360 tablet 0   No current facility-administered medications for this visit.    REVIEW OF SYSTEMS:    10 Point review of Systems was done is negative except as noted above.  PHYSICAL EXAMINATION: .BP 116/71 (BP Location: Left Arm, Patient Position: Sitting)   Pulse 97   Temp 97.7 F (36.5 C) (Oral)   Resp 15   Wt (!) 302 lb 1.6 oz (137 kg)   SpO2 96%   BMI 37.76 kg/m   GENERAL:alert, in no acute distress and comfortable SKIN: no acute rashes, no significant lesions EYES: conjunctiva are pink and non-injected, sclera anicteric OROPHARYNX: MMM, no exudates, no oropharyngeal erythema or ulceration NECK: supple, no JVD LYMPH:  no palpable lymphadenopathy in the cervical, axillary or inguinal regions LUNGS: clear to auscultation b/l with normal respiratory effort HEART: regular rate & rhythm ABDOMEN:  normoactive bowel sounds , non tender, not distended.  No palpable hepatosplenomegaly Extremity: no pedal edema PSYCH: alert & oriented x 3 with fluent speech NEURO: no focal motor/sensory deficits   LABORATORY DATA:  I have reviewed the data as listed  .    Latest Ref Rng & Units 10/05/2023    8:42 AM 08/06/2023    3:22 PM 05/11/2023    1:40 PM  CBC  WBC 4.0 - 10.5 K/uL 13.3  13.1  13.1   Hemoglobin 13.0 - 17.0 g/dL 82.0  80.9  82.4   Hematocrit 39.0 - 52.0 % 52.7  56.1  52.0   Platelets 150 - 400 K/uL 261  276  297.0     .    Latest Ref Rng & Units 10/05/2023    8:42 AM 08/06/2023    3:22 PM 05/11/2023    1:40 PM  CMP  Glucose 70 - 99 mg/dL 884  897  849   BUN 6 - 20 mg/dL 18  17  23  Creatinine 0.61 - 1.24 mg/dL  8.84  8.68  8.72   Sodium 135 - 145 mmol/L 138  138  138   Potassium 3.5 - 5.1 mmol/L 4.0  3.8  3.9   Chloride 98 - 111 mmol/L 106  104  103   CO2 22 - 32 mmol/L 24  24  24    Calcium  8.9 - 10.3 mg/dL 9.2  9.2  9.6   Total Protein 6.5 - 8.1 g/dL 6.7  7.5  7.3   Total Bilirubin 0.0 - 1.2 mg/dL 0.6  0.8  0.4   Alkaline Phos 38 - 126 U/L 68  50  72   AST 15 - 41 U/L 19  28  23    ALT 0 - 44 U/L 22  29  26         RADIOGRAPHIC STUDIES: I have personally reviewed the radiological images as listed and agreed with the findings in the report. No results found.  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  Polycythemia vera ruled out with negative clonal molecular markers on testing. Patient has had chronic polycythemia for a long time with multiple secondary risk factors including severe sleep apnea, systolic CHF, hemoconcentration from diuretics and Jardiance , use of testosterone  previously earlier this year.  PLAN: - Patient has not been referred back to us  for input regarding his polycythemia. - We discussed that for treatment of secondary polycythemia are very different than treatment of polycythemia vera which is a new bone marrow problem. - His previous workup showed negative clonal markers making polycythemia vera less than 1% likely. - At this time patient's hematocrit showed pattern of significant variations from 49 about 11 months ago to a high of 60.7.  This supposed to be a function of his hydration status medication use and treatment of sleep apnea. - Labs today show a hematocrit of 52.7. - We discussed that there is no clear guidelines on the appropriate hematocrit to target with secondary polycythemia since the increases in hematocrit primarily reflect body's compensation from other medical issues. - The primary treatment for secondary polycythemia is to optimize and manage his underlying  medical issues. - In his case it would be optimization of treatment of his sleep apnea with a CPAP machine and if needed using additional oxygen, management of diuretics and other medications to avoid excessive dehydration, low-salt diet and adequate hydration and avoiding other medications that could increase his RBC mass suggest testosterone . - If family had significant cardiac and VTE risk factors sometimes phlebotomies are done to try to keep the hematocrit less than 55% if treating underlying secondary causes does not help enough. - At this time is of adequate is does not 55 and there is no clear indication for transfusion support. - He was recommended to maintain close follow-up with his primary care physician and cardiologist to optimize management of his underlying medical issues. - The use of testosterone  would be a risk versus benefit decision but would increase his hematocrit.  In general if it was determined that the use of testosterone  is unavoidable then the lowest possible dose that would cause therapeutic effects would be recommended. - Due to cardiac issues he will not tolerate therapeutic phlebotomies well. - All pros and cons of consideration of testosterone  were discussed with the patient but the final decision rests between him and the prescribing physician. - If testosterone  is used he will need to find a blood donation  center that might allow him to donate blood a couple times a year to try to control his hematocrits if they remain persistently elevated above 55.  FOLLOW-UP: Return to Dr. Onesimo as needed  The total time spent in the appointment was 30 minutes* .  All of the patient's questions were answered with apparent satisfaction. The patient knows to call the clinic with any problems, questions or concerns.   Emaline Onesimo MD MS AAHIVMS [facility] Hematology/Oncology Physician [facility]  .*Total Encounter Time as defined by the Centers for Medicare and  Medicaid Services includes, in addition to the face-to-face time of a patient visit (documented in the note above) non-face-to-face time: obtaining and reviewing outside history, ordering and reviewing medications, tests or procedures, care coordination (communications with other health care professionals or caregivers) and documentation in the medical record.    I,Mitra Faeizi,acting as a Neurosurgeon for Emaline Onesimo, MD.,have documented all relevant documentation on the behalf of Emaline Onesimo, MD,as directed by  Emaline Onesimo, MD while in the presence of Emaline Onesimo, MD.  .I have reviewed the above documentation for accuracy and completeness, and I agree with the above. [facility].Ellyce Lafevers Kishore Bryannah Boston MD

## 2023-10-05 ENCOUNTER — Inpatient Hospital Stay: Attending: Hematology

## 2023-10-05 ENCOUNTER — Inpatient Hospital Stay (HOSPITAL_BASED_OUTPATIENT_CLINIC_OR_DEPARTMENT_OTHER): Admitting: Hematology

## 2023-10-05 VITALS — BP 116/71 | HR 97 | Temp 97.7°F | Resp 15 | Wt 302.1 lb

## 2023-10-05 DIAGNOSIS — Z808 Family history of malignant neoplasm of other organs or systems: Secondary | ICD-10-CM | POA: Insufficient documentation

## 2023-10-05 DIAGNOSIS — D751 Secondary polycythemia: Secondary | ICD-10-CM | POA: Diagnosis not present

## 2023-10-05 DIAGNOSIS — Z803 Family history of malignant neoplasm of breast: Secondary | ICD-10-CM | POA: Diagnosis not present

## 2023-10-05 LAB — CBC WITH DIFFERENTIAL (CANCER CENTER ONLY)
Abs Immature Granulocytes: 0.25 K/uL — ABNORMAL HIGH (ref 0.00–0.07)
Basophils Absolute: 0.2 K/uL — ABNORMAL HIGH (ref 0.0–0.1)
Basophils Relative: 1 %
Eosinophils Absolute: 0.7 K/uL — ABNORMAL HIGH (ref 0.0–0.5)
Eosinophils Relative: 5 %
HCT: 52.7 % — ABNORMAL HIGH (ref 39.0–52.0)
Hemoglobin: 17.9 g/dL — ABNORMAL HIGH (ref 13.0–17.0)
Immature Granulocytes: 2 %
Lymphocytes Relative: 18 %
Lymphs Abs: 2.4 K/uL (ref 0.7–4.0)
MCH: 29.5 pg (ref 26.0–34.0)
MCHC: 34 g/dL (ref 30.0–36.0)
MCV: 87 fL (ref 80.0–100.0)
Monocytes Absolute: 1.4 K/uL — ABNORMAL HIGH (ref 0.1–1.0)
Monocytes Relative: 10 %
Neutro Abs: 8.4 K/uL — ABNORMAL HIGH (ref 1.7–7.7)
Neutrophils Relative %: 64 %
Platelet Count: 261 K/uL (ref 150–400)
RBC: 6.06 MIL/uL — ABNORMAL HIGH (ref 4.22–5.81)
RDW: 13.5 % (ref 11.5–15.5)
WBC Count: 13.3 K/uL — ABNORMAL HIGH (ref 4.0–10.5)
nRBC: 0 % (ref 0.0–0.2)

## 2023-10-05 LAB — CMP (CANCER CENTER ONLY)
ALT: 22 U/L (ref 0–44)
AST: 19 U/L (ref 15–41)
Albumin: 3.8 g/dL (ref 3.5–5.0)
Alkaline Phosphatase: 68 U/L (ref 38–126)
Anion gap: 8 (ref 5–15)
BUN: 18 mg/dL (ref 6–20)
CO2: 24 mmol/L (ref 22–32)
Calcium: 9.2 mg/dL (ref 8.9–10.3)
Chloride: 106 mmol/L (ref 98–111)
Creatinine: 1.15 mg/dL (ref 0.61–1.24)
GFR, Estimated: >60 mL/min (ref >60)
Glucose, Bld: 115 mg/dL — ABNORMAL HIGH (ref 70–99)
Potassium: 4 mmol/L (ref 3.5–5.1)
Sodium: 138 mmol/L (ref 135–145)
Total Bilirubin: 0.6 mg/dL (ref 0.0–1.2)
Total Protein: 6.7 g/dL (ref 6.5–8.1)

## 2023-10-05 LAB — FERRITIN: Ferritin: 332 ng/mL (ref 24–336)

## 2023-10-05 LAB — IRON AND IRON BINDING CAPACITY (CC-WL,HP ONLY)
Iron: 120 ug/dL (ref 45–182)
Saturation Ratios: 41 % — ABNORMAL HIGH (ref 17.9–39.5)
TIBC: 290 ug/dL (ref 250–450)
UIBC: 170 ug/dL (ref 117–376)

## 2023-10-06 LAB — ERYTHROPOIETIN: Erythropoietin: 6.9 m[IU]/mL (ref 2.6–18.5)

## 2023-10-07 DIAGNOSIS — G4733 Obstructive sleep apnea (adult) (pediatric): Secondary | ICD-10-CM | POA: Diagnosis not present

## 2023-10-21 DIAGNOSIS — E66811 Obesity, class 1: Secondary | ICD-10-CM

## 2023-10-28 ENCOUNTER — Other Ambulatory Visit (HOSPITAL_COMMUNITY): Payer: Self-pay

## 2023-10-28 MED ORDER — ZEPBOUND 12.5 MG/0.5ML ~~LOC~~ SOAJ
12.5000 mg | SUBCUTANEOUS | 5 refills | Status: DC
  Filled 2023-10-28 – 2023-11-17 (×4): qty 2, 28d supply, fill #0
  Filled 2023-11-26: qty 2, 28d supply, fill #1

## 2023-10-28 NOTE — Addendum Note (Signed)
 Addended by: DARRELL BRUCKNER on: 10/28/2023 01:01 PM   Modules accepted: Orders

## 2023-11-02 ENCOUNTER — Other Ambulatory Visit (HOSPITAL_COMMUNITY): Payer: Self-pay

## 2023-11-05 DIAGNOSIS — G4733 Obstructive sleep apnea (adult) (pediatric): Secondary | ICD-10-CM | POA: Diagnosis not present

## 2023-11-17 ENCOUNTER — Other Ambulatory Visit: Payer: Self-pay

## 2023-11-17 ENCOUNTER — Other Ambulatory Visit (HOSPITAL_COMMUNITY): Payer: Self-pay

## 2023-11-17 ENCOUNTER — Encounter: Payer: Self-pay | Admitting: Family Medicine

## 2023-11-26 ENCOUNTER — Telehealth: Payer: Self-pay | Admitting: *Deleted

## 2023-11-26 ENCOUNTER — Encounter: Payer: Self-pay | Admitting: Cardiology

## 2023-11-26 ENCOUNTER — Other Ambulatory Visit (HOSPITAL_BASED_OUTPATIENT_CLINIC_OR_DEPARTMENT_OTHER): Payer: Self-pay | Admitting: Internal Medicine

## 2023-11-26 NOTE — Telephone Encounter (Signed)
 [facility] (Vendor/Third Party)   Subject     Topic  General - Other    Communication  Reason for CRM: Staff from [facility] called due to receiving orders for a phlebotomy, possible pt's name is [name], staff stated that no DOB or any other info was sent, orders are missing frequency and parameters, please call back at [phone].

## 2023-11-27 ENCOUNTER — Other Ambulatory Visit: Payer: Self-pay

## 2023-11-27 ENCOUNTER — Other Ambulatory Visit (HOSPITAL_COMMUNITY): Payer: Self-pay

## 2023-11-27 MED ORDER — SPIRONOLACTONE 25 MG PO TABS
25.0000 mg | ORAL_TABLET | Freq: Every day | ORAL | 5 refills | Status: DC
  Filled 2023-11-27: qty 30, 30d supply, fill #0

## 2023-12-02 ENCOUNTER — Ambulatory Visit: Attending: Cardiovascular Disease | Admitting: Cardiovascular Disease

## 2023-12-02 ENCOUNTER — Ambulatory Visit: Payer: Self-pay | Admitting: Cardiovascular Disease

## 2023-12-02 ENCOUNTER — Encounter: Payer: Self-pay | Admitting: Cardiovascular Disease

## 2023-12-02 VITALS — BP 120/76 | HR 118 | Ht 75.0 in | Wt 295.0 lb

## 2023-12-02 DIAGNOSIS — I4892 Unspecified atrial flutter: Secondary | ICD-10-CM | POA: Diagnosis not present

## 2023-12-02 DIAGNOSIS — I4819 Other persistent atrial fibrillation: Secondary | ICD-10-CM

## 2023-12-02 DIAGNOSIS — Z01812 Encounter for preprocedural laboratory examination: Secondary | ICD-10-CM | POA: Diagnosis not present

## 2023-12-02 LAB — BASIC METABOLIC PANEL WITH GFR
BUN/Creatinine Ratio: 12 (ref 9–20)
BUN: 17 mg/dL (ref 6–24)
CO2: 22 mmol/L (ref 20–29)
Calcium: 9.7 mg/dL (ref 8.7–10.2)
Chloride: 99 mmol/L (ref 96–106)
Creatinine, Ser: 1.41 mg/dL — ABNORMAL HIGH (ref 0.76–1.27)
Glucose: 101 mg/dL — ABNORMAL HIGH (ref 70–99)
Potassium: 4.9 mmol/L (ref 3.5–5.2)
Sodium: 137 mmol/L (ref 134–144)
eGFR: 57 mL/min/1.73 — ABNORMAL LOW (ref >59)

## 2023-12-02 LAB — CBC
Hematocrit: 51.2 % — ABNORMAL HIGH (ref 37.5–51.0)
Hemoglobin: 17.2 g/dL (ref 13.0–17.7)
MCH: 29.3 pg (ref 26.6–33.0)
MCHC: 33.6 g/dL (ref 31.5–35.7)
MCV: 87 fL (ref 79–97)
Platelets: 274 x10E3/uL (ref 150–450)
RBC: 5.87 x10E6/uL — ABNORMAL HIGH (ref 4.14–5.80)
RDW: 12.9 % (ref 11.6–15.4)
WBC: 10.4 x10E3/uL (ref 3.4–10.8)

## 2023-12-02 MED ORDER — AMIODARONE HCL 200 MG PO TABS: 200.0000 mg | ORAL_TABLET | Freq: Every day | ORAL | 1 refills | Status: DC

## 2023-12-02 MED ORDER — AMIODARONE HCL 200 MG PO TABS: 400.0000 mg | ORAL_TABLET | Freq: Two times a day (BID) | ORAL | 0 refills | Status: DC

## 2023-12-02 NOTE — Progress Notes (Signed)
 Electrophysiology Office Note:    Date:  12/02/2023   [name], DOB 04-09-1965, MRN [id number]  PCP:  Micheal [name], MD   [facility] Providers Cardiologist:  Wilbert Bihari, MD Electrophysiologist:  Eulas FORBES Furbish, MD  Sleep Medicine:  Wilbert Bihari, MD     Referring MD: Micheal [name], MD   History of Present Illness:    Harold Park is a 58 y.o. male with a medical history significant for recurrent admissions for atrial flutter and fibrillation, congestive heart failure with reduced ejection fraction who presents for EP follow-up.     According to previous notes, he was first diagnosed with paroxysmal atrial flutter in November 2011.  His ejection fraction has been depressed with intermittent partial recovery for over a decade, was 30 to 35% in November 2011.  He was diagnosed with atrial fibrillation in April 2017 and converted to sinus rhythm prior to planned cardioversion.  He first came to [facility] health in May 2017.  At that time he was started on Entresto .  Sleep study showed severe sleep apnea.  He was seen by Dr. Fernande for ICD consideration. Holter monitor showed 6.3% PVCs, and his MRI did not show any LGE. ICD was deferred due to a possible reversible cause, and Ranexa  was tried to suppress his PVCs.   He tried amiodarone  but has not been able to tolerate it due to severe nausea.    He underwent DC cardioversion in April 2019 for recurrent AF and was seen in AF clinic days later but felt not to be a good candidate for ablation at the time due to his obesity (~330 lbs at the time) and LA dimension of 51 cm.  Continue to have recurrences of atrial fibrillation flutter and was again seen in A-fib clinic on August 2 and considered for Tikosyn load.  However, he has had recurrent admissions for AF and severe acute CHF and cardiogenic shock for which he received IV amiodarone  loads. On at least one occasion, he presented with a very wide QRS rhythm,  likely due to antiarrhythmic drug effects.  He was able to maintain sinus rhythm on amiodarone , which he had tolerated poorly in the past.  He underwent ablation for atrial fibrillation on October 9 with pulsed with ablation of the pulmonary veins and posterior wall of the left atrium.  Additionally, a CTI line was performed.  During the ablation he converted to an atypical flutter that broke during ablation of the posterior wall.      Amiodarone  was discontinued during the last visit.  According to his Apple Watch, he has not had any recurrence of AF.  We discussed the indication for ICD previously, but he was hesitant to proceed.  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  EKGs/Labs/Other Studies Reviewed Today:    Echocardiogram:  Echocardiogram October 28, 2022 LVEF 25 to 30%.  Severe LV dilation.  Left atrium severely dilated, 5.2 cm; right atrium moderately dilated.   Monitors:   Stress testing:   Advanced imaging:  CTA coronary December 2021 0 Agastson units  Cardiac MRI February 2025 LVEF 28%.  Moderately dilated RV EF 33%.  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  Cardiac catherization   EKG:         Physical Exam:    VS:  There were no vitals taken for this visit.    Wt  Readings from Last 3 Encounters:  10/05/23 (!) 302 lb 1.6 oz (137 kg)  09/23/23 (!) 303 lb (137.4 kg)  08/06/23 (!) 303 lb 6.4 oz (137.6 kg)     GEN: Well nourished, well developed in no acute distress CARDIAC: RRR, no murmurs, rubs, gallops RESPIRATORY:  Normal work of breathing MUSCULOSKELETAL: no edema    ASSESSMENT & PLAN:     Persistent atrial fibrillation Has difficult to control RVR, resulting in multiple CHF admissions Status post ablation December 17, 2022 -- now with atypical atrial flutter Rates have consistently been difficult to control.  I am  concerned that he may need an AVJ ablation in the future if he has recurrence of AF   Atrial flutter -- appearing both typical and atypical S/p ablation of the CTI and posterior wall of the LA  With atypical atrial flutter today, rates uncontrolled Will start amiodarone  400 mg twice daily --if he cannot tolerate this dose, he can cut back to 200 3 times daily, or feeling that 200 twice daily Schedule DC cardioversion Schedule repeat ablation for what I suspect is a perimitral atypical atrial flutter  We discussed the indication, rationale, logistics, anticipated benefits, and potential risks of the ablation procedure including but not limited to -- bleed at the groin access site, chest pain, damage to nearby organs such as the diaphragm, lungs, or esophagus, need for a drainage tube, or prolonged hospitalization. I explained that the risk for stroke, heart attack, need for open chest surgery, or even death is very low but not zero. he  expressed understanding and wishes to proceed.   CHFrEF Appears euvolemic and compensated today Repeat TTE shows persistently decreased EF (25-30%) despite 90 days of GDMT and rhythm control CMR shows depressed EF and LGE; I again recommended ICD LGE pattern is concerning for sarcoid; He has follow-up with Dr. Bensimhon within a few weeks. 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   We discussed the importance of weight loss for maintenance of sinus rhythm    Signed, Eulas FORBES Furbish, MD  12/02/2023 8:40 AM    [facility]

## 2023-12-02 NOTE — Patient Instructions (Signed)
 Medication Instructions:  Your physician has recommended you make the following change in your medication:   Begin Amiodarone  200mg  - 2 tablet by mouth twice daily for 2 weeks then decrease to Amiodarone  200mg  - 1 tablet by mouth daily  *If you need a refill on your cardiac medications before your next appointment, please call your pharmacy*  Lab Work: CBC and BMET today If you have labs (blood work) drawn today and your tests are completely normal, you will receive your results only by: MyChart Message (if you have MyChart) OR A paper copy in the mail If you have any lab test that is abnormal or we need to change your treatment, we will call you to review the results.  Testing/Procedures: Your physician has recommended that you have a Cardioversion (DCCV). Electrical Cardioversion uses a jolt of electricity to your heart either through paddles or wired patches attached to your chest. This is a controlled, usually prescheduled, procedure. Defibrillation is done under light anesthesia in the hospital, and you usually go home the day of the procedure. This is done to get your heart back into a normal rhythm. You are not awake for the procedure. Please see the instruction sheet given to you today.   Your physician has recommended that you have an ablation. Catheter ablation is a medical procedure used to treat some cardiac arrhythmias (irregular heartbeats). During catheter ablation, a long, thin, flexible tube is put into a blood vessel in your groin (upper thigh), or neck. This tube is called an ablation catheter. It is then guided to your heart through the blood vessel. Radio frequency waves destroy small areas of heart tissue where abnormal heartbeats may cause an arrhythmia to start. Please see the instruction sheet given to you today.   Follow-Up: At [facility], you and your health needs are our priority.  As part of our continuing mission to provide you with exceptional heart care,  our providers are all part of one team.  This team includes your primary Cardiologist (physician) and Advanced Practice Providers or APPs (Physician Assistants and Nurse Practitioners) who all work together to provide you with the care you need, when you need it.  Your next appointment:   To be scheduled      Dear Harold Park  You are scheduled for a Cardioversion on Thursday, October 2 with Dr. Michele.  Please arrive at the [facility] (Main Entrance A) at [facility]: [address] [place], [place] [address] at 8:00 AM (This time is 1 hour(s) before your procedure to ensure your preparation).   Free valet parking service is available. You will check in at ADMITTING.   *Please Note: You will receive a call the day before your procedure to confirm the appointment time. That time may have changed from the original time based on the schedule for that day.*   DIET:  Nothing to eat or drink after midnight except a sip of water with medications (see medication instructions below)  MEDICATION INSTRUCTIONS: !!IF ANY NEW MEDICATIONS ARE STARTED AFTER TODAY, PLEASE NOTIFY YOUR PROVIDER AS SOON AS POSSIBLE!!  FYI: Medications such as Semaglutide  (Ozempic , Wegovy ), Tirzepatide  (Mounjaro , Zepbound ), Dulaglutide (Trulicity), etc (GLP1 agonists) AND Canagliflozin (Invokana), Dapagliflozin (Farxiga), Empagliflozin  (Jardiance ), Ertugliflozin (Steglatro), Bexagliflozin [employer]) or any combination with one of these drugs such as Invokamet (Canagliflozin/Metformin), Synjardy (Empagliflozin /Metformin), etc (SGLT2 inhibitors) must be held around the time of a procedure. This is not a comprehensive list of all of these drugs. Please review all of your  medications and talk to your provider if you take any one of these. If you are not sure, ask your provider.   HOLD: Tirzepatide  (Mounjaro , Zepbound ) for 7 days prior to the procedure. Do not take your dose this Sunday 12/06/2023 as you normally  do.  HOLD: Empagliflozin  (Jardiance ) for 3 days prior to the procedure. Last dose on Sunday, September 28.   Continue taking your anticoagulant (blood thinner): Apixaban  (Eliquis ).   Do not take Torsemide  the morning of your procedure.  You may take your other medications with enough water to get them down safely. You will need to continue this after your procedure until you are told by your provider that it is safe to stop.    LABS:  CBC and BMET today  FYI:  For your safety, and to allow us  to monitor your vital signs accurately during the surgery/procedure we request: If you have artificial nails, gel coating, SNS etc, please have those removed prior to your surgery/procedure. Not having the nail coverings /polish removed may result in cancellation or delay of your surgery/procedure.  Your support person will be asked to wait in the waiting room during your procedure.  It is OK to have someone drop you off and come back when you are ready to be discharged.  You cannot drive after the procedure and will need someone to drive you home.  Bring your insurance cards.  *Special Note: Every effort is made to have your procedure done on time. Occasionally there are emergencies that occur at the hospital that may cause delays. Please be patient if a delay does occur.

## 2023-12-02 NOTE — H&P (View-Only) (Signed)
 Electrophysiology Office Note:    Date:  12/02/2023   [name], DOB 04-09-1965, MRN [id number]  PCP:  Micheal [name], MD   [facility] Providers Cardiologist:  Wilbert Bihari, MD Electrophysiologist:  Eulas FORBES Furbish, MD  Sleep Medicine:  Wilbert Bihari, MD     Referring MD: Micheal [name], MD   History of Present Illness:    Harold Park is a 58 y.o. male with a medical history significant for recurrent admissions for atrial flutter and fibrillation, congestive heart failure with reduced ejection fraction who presents for EP follow-up.     According to previous notes, he was first diagnosed with paroxysmal atrial flutter in November 2011.  His ejection fraction has been depressed with intermittent partial recovery for over a decade, was 30 to 35% in November 2011.  He was diagnosed with atrial fibrillation in April 2017 and converted to sinus rhythm prior to planned cardioversion.  He first came to [facility] in May 2017.  At that time he was started on Entresto .  Sleep study showed severe sleep apnea.  He was seen by Dr. Fernande for ICD consideration. Holter monitor showed 6.3% PVCs, and his MRI did not show any LGE. ICD was deferred due to a possible reversible cause, and Ranexa  was tried to suppress his PVCs.   He tried amiodarone  but has not been able to tolerate it due to severe nausea.    He underwent DC cardioversion in April 2019 for recurrent AF and was seen in AF clinic days later but felt not to be a good candidate for ablation at the time due to his obesity (~330 lbs at the time) and LA dimension of 51 cm.  Continue to have recurrences of atrial fibrillation flutter and was again seen in A-fib clinic on August 2 and considered for Tikosyn load.  However, he has had recurrent admissions for AF and severe acute CHF and cardiogenic shock for which he received IV amiodarone  loads. On at least one occasion, he presented with a very wide QRS rhythm,  likely due to antiarrhythmic drug effects.  He was able to maintain sinus rhythm on amiodarone , which he had tolerated poorly in the past.  He underwent ablation for atrial fibrillation on October 9 with pulsed with ablation of the pulmonary veins and posterior wall of the left atrium.  Additionally, a CTI line was performed.  During the ablation he converted to an atypical flutter that broke during ablation of the posterior wall.      Amiodarone  was discontinued during the last visit.  According to his Apple Watch, he has not had any recurrence of AF.  We discussed the indication for ICD previously, but he was hesitant to proceed.  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  EKGs/Labs/Other Studies Reviewed Today:    Echocardiogram:  Echocardiogram October 28, 2022 LVEF 25 to 30%.  Severe LV dilation.  Left atrium severely dilated, 5.2 cm; right atrium moderately dilated.   Monitors:   Stress testing:   Advanced imaging:  CTA coronary December 2021 0 Agastson units  Cardiac MRI February 2025 LVEF 28%.  Moderately dilated RV EF 33%.  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  Cardiac catherization   EKG:         Physical Exam:    VS:  There were no vitals taken for this visit.    Wt  Readings from Last 3 Encounters:  10/05/23 (!) 302 lb 1.6 oz (137 kg)  09/23/23 (!) 303 lb (137.4 kg)  08/06/23 (!) 303 lb 6.4 oz (137.6 kg)     GEN: Well nourished, well developed in no acute distress CARDIAC: RRR, no murmurs, rubs, gallops RESPIRATORY:  Normal work of breathing MUSCULOSKELETAL: no edema    ASSESSMENT & PLAN:     Persistent atrial fibrillation Has difficult to control RVR, resulting in multiple CHF admissions Status post ablation December 17, 2022 -- now with atypical atrial flutter Rates have consistently been difficult to control.  I am  concerned that he may need an AVJ ablation in the future if he has recurrence of AF   Atrial flutter -- appearing both typical and atypical S/p ablation of the CTI and posterior wall of the LA  With atypical atrial flutter today, rates uncontrolled Will start amiodarone  400 mg twice daily --if he cannot tolerate this dose, he can cut back to 200 3 times daily, or feeling that 200 twice daily Schedule DC cardioversion Schedule repeat ablation for what I suspect is a perimitral atypical atrial flutter  We discussed the indication, rationale, logistics, anticipated benefits, and potential risks of the ablation procedure including but not limited to -- bleed at the groin access site, chest pain, damage to nearby organs such as the diaphragm, lungs, or esophagus, need for a drainage tube, or prolonged hospitalization. I explained that the risk for stroke, heart attack, need for open chest surgery, or even death is very low but not zero. he  expressed understanding and wishes to proceed.   CHFrEF Appears euvolemic and compensated today Repeat TTE shows persistently decreased EF (25-30%) despite 90 days of GDMT and rhythm control CMR shows depressed EF and LGE; I again recommended ICD LGE pattern is concerning for sarcoid; He has follow-up with Dr. Bensimhon within a few weeks. 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   We discussed the importance of weight loss for maintenance of sinus rhythm    Signed, Eulas FORBES Furbish, MD  12/02/2023 8:40 AM    [facility]

## 2023-12-03 NOTE — Addendum Note (Signed)
 Addended by: CASIMIR ALDONA BRAVO on: 12/03/2023 05:15 PM   Modules accepted: Orders

## 2023-12-03 NOTE — Addendum Note (Signed)
 Addended by: CASIMIR ALDONA BRAVO on: 12/03/2023 04:58 PM   Modules accepted: Orders

## 2023-12-08 ENCOUNTER — Telehealth: Payer: Self-pay | Admitting: *Deleted

## 2023-12-08 NOTE — Telephone Encounter (Signed)
 Copied from CRM #[id number]. Topic: General - Other >> Dec 08, 2023 11:57 AM Mesmerise C wrote: Reason for CRM: Conard from [facility] stated they received orders but need the parameters and hemoglobin can be reached at [phone] and fax # [phone]

## 2023-12-09 NOTE — Progress Notes (Addendum)
 Called patient with pre-procedure instructions for tomorrow. Left message on identified voicemail box:  Time to arrive for procedure (0800). Remain NPO past midnight.  Must have a ride home and a responsible adult to remain with them for 24 hours post procedure. Instructed to take am meds with sip of water and emphasized blood thinner consistency, stopping GLP-2s three days before procedure.  Callback number left for additional questions.

## 2023-12-09 NOTE — Telephone Encounter (Signed)
 I spoke with the infusion center and they stated they need parameters for frequency of phlebotomy and also they need a note on order stating that if Hemoglobin is less than 12 or 12.5 then order for phlebotomy can be placed on hold.

## 2023-12-10 ENCOUNTER — Ambulatory Visit (HOSPITAL_BASED_OUTPATIENT_CLINIC_OR_DEPARTMENT_OTHER)
Admission: RE | Admit: 2023-12-10 | Discharge: 2023-12-10 | Disposition: A | Discharge status: Home / Self Care | Source: Home / Self Care | Attending: Cardiology | Admitting: Cardiology

## 2023-12-10 ENCOUNTER — Encounter (HOSPITAL_COMMUNITY)
Admission: RE | Disposition: A | Discharge status: Home / Self Care | Payer: Self-pay | Source: Home / Self Care | Attending: Cardiology

## 2023-12-10 ENCOUNTER — Ambulatory Visit (HOSPITAL_COMMUNITY): Admitting: Anesthesiology

## 2023-12-10 ENCOUNTER — Other Ambulatory Visit: Payer: Self-pay

## 2023-12-10 DIAGNOSIS — E669 Obesity, unspecified: Secondary | ICD-10-CM | POA: Insufficient documentation

## 2023-12-10 DIAGNOSIS — D869 Sarcoidosis, unspecified: Secondary | ICD-10-CM | POA: Diagnosis not present

## 2023-12-10 DIAGNOSIS — Z7189 Other specified counseling: Secondary | ICD-10-CM | POA: Diagnosis not present

## 2023-12-10 DIAGNOSIS — Z79899 Other long term (current) drug therapy: Secondary | ICD-10-CM | POA: Insufficient documentation

## 2023-12-10 DIAGNOSIS — E1122 Type 2 diabetes mellitus with diabetic chronic kidney disease: Secondary | ICD-10-CM | POA: Diagnosis present

## 2023-12-10 DIAGNOSIS — G4733 Obstructive sleep apnea (adult) (pediatric): Secondary | ICD-10-CM | POA: Diagnosis not present

## 2023-12-10 DIAGNOSIS — I110 Hypertensive heart disease with heart failure: Secondary | ICD-10-CM | POA: Diagnosis not present

## 2023-12-10 DIAGNOSIS — I484 Atypical atrial flutter: Secondary | ICD-10-CM | POA: Diagnosis not present

## 2023-12-10 DIAGNOSIS — J42 Unspecified chronic bronchitis: Secondary | ICD-10-CM | POA: Diagnosis present

## 2023-12-10 DIAGNOSIS — Y848 Other medical procedures as the cause of abnormal reaction of the patient, or of later complication, without mention of misadventure at the time of the procedure: Secondary | ICD-10-CM | POA: Diagnosis present

## 2023-12-10 DIAGNOSIS — K7200 Acute and subacute hepatic failure without coma: Secondary | ICD-10-CM | POA: Diagnosis not present

## 2023-12-10 DIAGNOSIS — I5023 Acute on chronic systolic (congestive) heart failure: Secondary | ICD-10-CM | POA: Diagnosis present

## 2023-12-10 DIAGNOSIS — Z515 Encounter for palliative care: Secondary | ICD-10-CM | POA: Diagnosis not present

## 2023-12-10 DIAGNOSIS — R34 Anuria and oliguria: Secondary | ICD-10-CM | POA: Diagnosis not present

## 2023-12-10 DIAGNOSIS — I5031 Acute diastolic (congestive) heart failure: Secondary | ICD-10-CM | POA: Diagnosis not present

## 2023-12-10 DIAGNOSIS — E119 Type 2 diabetes mellitus without complications: Secondary | ICD-10-CM | POA: Insufficient documentation

## 2023-12-10 DIAGNOSIS — I4892 Unspecified atrial flutter: Secondary | ICD-10-CM | POA: Diagnosis not present

## 2023-12-10 DIAGNOSIS — N1832 Chronic kidney disease, stage 3b: Secondary | ICD-10-CM | POA: Diagnosis present

## 2023-12-10 DIAGNOSIS — R578 Other shock: Secondary | ICD-10-CM | POA: Diagnosis not present

## 2023-12-10 DIAGNOSIS — I472 Ventricular tachycardia, unspecified: Secondary | ICD-10-CM | POA: Diagnosis not present

## 2023-12-10 DIAGNOSIS — D6869 Other thrombophilia: Secondary | ICD-10-CM | POA: Insufficient documentation

## 2023-12-10 DIAGNOSIS — G4730 Sleep apnea, unspecified: Secondary | ICD-10-CM | POA: Insufficient documentation

## 2023-12-10 DIAGNOSIS — D84821 Immunodeficiency due to drugs: Secondary | ICD-10-CM | POA: Diagnosis present

## 2023-12-10 DIAGNOSIS — G9341 Metabolic encephalopathy: Secondary | ICD-10-CM | POA: Diagnosis not present

## 2023-12-10 DIAGNOSIS — Z1152 Encounter for screening for COVID-19: Secondary | ICD-10-CM | POA: Diagnosis not present

## 2023-12-10 DIAGNOSIS — Z7901 Long term (current) use of anticoagulants: Secondary | ICD-10-CM | POA: Insufficient documentation

## 2023-12-10 DIAGNOSIS — I129 Hypertensive chronic kidney disease with stage 1 through stage 4 chronic kidney disease, or unspecified chronic kidney disease: Secondary | ICD-10-CM | POA: Diagnosis not present

## 2023-12-10 DIAGNOSIS — Z6835 Body mass index (BMI) 35.0-35.9, adult: Secondary | ICD-10-CM | POA: Diagnosis not present

## 2023-12-10 DIAGNOSIS — Z87891 Personal history of nicotine dependence: Secondary | ICD-10-CM | POA: Insufficient documentation

## 2023-12-10 DIAGNOSIS — Z66 Do not resuscitate: Secondary | ICD-10-CM | POA: Diagnosis not present

## 2023-12-10 DIAGNOSIS — E1159 Type 2 diabetes mellitus with other circulatory complications: Secondary | ICD-10-CM | POA: Diagnosis not present

## 2023-12-10 DIAGNOSIS — I4891 Unspecified atrial fibrillation: Secondary | ICD-10-CM | POA: Diagnosis not present

## 2023-12-10 DIAGNOSIS — N1831 Chronic kidney disease, stage 3a: Secondary | ICD-10-CM | POA: Diagnosis not present

## 2023-12-10 DIAGNOSIS — N179 Acute kidney failure, unspecified: Secondary | ICD-10-CM | POA: Diagnosis present

## 2023-12-10 DIAGNOSIS — J9601 Acute respiratory failure with hypoxia: Secondary | ICD-10-CM | POA: Diagnosis present

## 2023-12-10 DIAGNOSIS — I10 Essential (primary) hypertension: Secondary | ICD-10-CM | POA: Diagnosis not present

## 2023-12-10 DIAGNOSIS — E871 Hypo-osmolality and hyponatremia: Secondary | ICD-10-CM | POA: Diagnosis not present

## 2023-12-10 DIAGNOSIS — I4819 Other persistent atrial fibrillation: Secondary | ICD-10-CM | POA: Insufficient documentation

## 2023-12-10 DIAGNOSIS — I5022 Chronic systolic (congestive) heart failure: Secondary | ICD-10-CM | POA: Insufficient documentation

## 2023-12-10 DIAGNOSIS — D696 Thrombocytopenia, unspecified: Secondary | ICD-10-CM | POA: Diagnosis not present

## 2023-12-10 DIAGNOSIS — I340 Nonrheumatic mitral (valve) insufficiency: Secondary | ICD-10-CM | POA: Diagnosis not present

## 2023-12-10 DIAGNOSIS — I130 Hypertensive heart and chronic kidney disease with heart failure and stage 1 through stage 4 chronic kidney disease, or unspecified chronic kidney disease: Secondary | ICD-10-CM | POA: Diagnosis present

## 2023-12-10 DIAGNOSIS — G928 Other toxic encephalopathy: Secondary | ICD-10-CM | POA: Diagnosis not present

## 2023-12-10 DIAGNOSIS — R0609 Other forms of dyspnea: Secondary | ICD-10-CM | POA: Diagnosis not present

## 2023-12-10 DIAGNOSIS — I4821 Permanent atrial fibrillation: Secondary | ICD-10-CM | POA: Diagnosis not present

## 2023-12-10 DIAGNOSIS — I509 Heart failure, unspecified: Secondary | ICD-10-CM | POA: Diagnosis not present

## 2023-12-10 DIAGNOSIS — I77810 Thoracic aortic ectasia: Secondary | ICD-10-CM | POA: Diagnosis not present

## 2023-12-10 DIAGNOSIS — I4510 Unspecified right bundle-branch block: Secondary | ICD-10-CM | POA: Diagnosis not present

## 2023-12-10 DIAGNOSIS — F32A Depression, unspecified: Secondary | ICD-10-CM | POA: Diagnosis present

## 2023-12-10 DIAGNOSIS — R570 Cardiogenic shock: Secondary | ICD-10-CM | POA: Diagnosis present

## 2023-12-10 DIAGNOSIS — I5082 Biventricular heart failure: Secondary | ICD-10-CM | POA: Diagnosis not present

## 2023-12-10 DIAGNOSIS — I480 Paroxysmal atrial fibrillation: Secondary | ICD-10-CM | POA: Diagnosis not present

## 2023-12-10 DIAGNOSIS — I483 Typical atrial flutter: Secondary | ICD-10-CM | POA: Diagnosis not present

## 2023-12-10 DIAGNOSIS — E66812 Obesity, class 2: Secondary | ICD-10-CM | POA: Diagnosis not present

## 2023-12-10 HISTORY — PX: CARDIOVERSION: EP1203

## 2023-12-10 SURGERY — CARDIOVERSION (CATH LAB)
Anesthesia: General

## 2023-12-10 MED ORDER — SODIUM CHLORIDE 0.9 % IV SOLN: INTRAVENOUS | Status: DC

## 2023-12-10 MED ORDER — PROPOFOL 10 MG/ML IV BOLUS
INTRAVENOUS | Status: DC | PRN
  Administered 2023-12-10: 90 mg via INTRAVENOUS

## 2023-12-10 MED ORDER — LIDOCAINE 2% (20 MG/ML) 5 ML SYRINGE
INTRAMUSCULAR | Status: DC | PRN
  Administered 2023-12-10: 100 mg via INTRAVENOUS

## 2023-12-10 SURGICAL SUPPLY — 1 items: PAD DEFIB RADIO PHYSIO CONN (PAD) ×1 IMPLANT

## 2023-12-10 NOTE — CV Procedure (Addendum)
   DIRECT CURRENT CARDIOVERSION  NAME:  Harold Park    MRN: [id number] DOB:  04/10/1965    ADMIT DATE: 12/10/2023  Indication:  Symptomatic atrial flutter   Procedure Note:  The patient signed informed consent.  He has been on therapeutic anticoagulation with Eliquis  greater than or equal to 3 weeks.  Anesthesia was administered by Dr. Boone.  Adequate airway was maintained throughout and vital followed per protocol.  He was cardioverted x 1 with 200J of biphasic synchronized energy.  Post procedure rhythm was sinus tachycardia. There were no apparent complications.  The patient had normal neuro status and respiratory status post procedure with vitals stable as recorded elsewhere.    Follow up:  Continue on current medical therapy and follow up with cardiology as scheduled. Spoke to his wife over the phone post-procedure.   Madonna Michele HAS, [facility]  A Division of [facility] [address] [place],  [address]  9:03 AM

## 2023-12-10 NOTE — Anesthesia Procedure Notes (Signed)
 Procedure Name: General with mask airway Date/Time: 12/10/2023 8:53 AM  Performed by: Vertie Arthea RAMAN, CRNAPre-anesthesia Checklist: Timeout performed, Patient being monitored, Suction available, Emergency Drugs available and Patient identified Patient Re-evaluated:Patient Re-evaluated prior to induction Oxygen Delivery Method: Nasal cannula and Simple face mask Preoxygenation: Pre-oxygenation with 100% oxygen Induction Type: IV induction

## 2023-12-10 NOTE — Telephone Encounter (Signed)
 Faxed

## 2023-12-10 NOTE — Anesthesia Postprocedure Evaluation (Signed)
 Anesthesia Post Note  Patient: Harold Park  Procedure(s) Performed: CARDIOVERSION     Patient location during evaluation: Cath Lab Anesthesia Type: General Level of consciousness: awake and alert Pain management: pain level controlled Vital Signs Assessment: post-procedure vital signs reviewed and stable Respiratory status: spontaneous breathing, nonlabored ventilation, respiratory function stable and patient connected to nasal cannula oxygen Cardiovascular status: blood pressure returned to baseline and stable Postop Assessment: no apparent nausea or vomiting Anesthetic complications: no   No notable events documented.  Last Vitals:  Vitals:   12/10/23 0935 12/10/23 0940  BP: 95/75   Pulse: (!) 103 (!) 103  Resp: (!) 24 (!) 29  Temp:  36.6 C  SpO2: 94% 95%    Last Pain:  Vitals:   12/10/23 0940  TempSrc: Temporal  PainSc: 0-No pain                 Rome Ade

## 2023-12-10 NOTE — Transfer of Care (Signed)
 Immediate Anesthesia Transfer of Care Note  Patient: Harold Park  Procedure(s) Performed: CARDIOVERSION  Patient Location: PACU  Anesthesia Type:General  Level of Consciousness: drowsy  Airway & Oxygen Therapy: Patient Spontanous Breathing and Patient connected to nasal cannula oxygen  Post-op Assessment: Report given to RN and Post -op Vital signs reviewed and stable  Post vital signs: Reviewed and stable  Last Vitals:  Vitals Value Taken Time  BP    Temp    Pulse    Resp 30 12/10/23 09:07  SpO2    Vitals shown include unfiled device data.  Last Pain:  Vitals:   12/10/23 0821  TempSrc:   PainSc: 0-No pain         Complications: No notable events documented.

## 2023-12-10 NOTE — Anesthesia Preprocedure Evaluation (Signed)
 Anesthesia Evaluation  Patient identified by MRN, date of birth, ID band Patient awake    Reviewed: Allergy & Precautions, NPO status , Patient's Chart, lab work & pertinent test results  History of Anesthesia Complications Negative for: history of anesthetic complications  Airway Mallampati: II  TM Distance: >3 FB Neck ROM: Full    Dental no notable dental hx. (+) Teeth Intact   Pulmonary shortness of breath, sleep apnea and Continuous Positive Airway Pressure Ventilation , neg COPD, Patient abstained from smoking.Not current smoker   Pulmonary exam normal breath sounds clear to auscultation       Cardiovascular Exercise Tolerance: Good METShypertension, Pt. on medications +CHF  (-) CAD and (-) Past MI + dysrhythmias Atrial Fibrillation + Valvular Problems/Murmurs MR  Rhythm:Irregular Rate:Tachycardia - Systolic murmurs  1. Left ventricular ejection fraction, by estimation, is 30 to 35%. Left  ventricular ejection fraction by 3D volume is 36 %. The left ventricle has  moderately decreased function. The left ventricle has no regional wall  motion abnormalities. The left  ventricular internal cavity size was mildly dilated. The average left  ventricular global longitudinal strain is -7.8 %. The global longitudinal  strain is abnormal.   2. Right ventricular systolic function is normal. The right ventricular  size is normal.   3. The mitral valve is normal in structure. Moderate mitral valve  regurgitation. No evidence of mitral stenosis.   4. The aortic valve is normal in structure. Aortic valve regurgitation is  not visualized. No aortic stenosis is present.   5. Aortic dilatation noted. There is mild dilatation of the ascending  aorta, measuring 40 mm.   6. The inferior vena cava is normal in size with greater than 50%  respiratory variability, suggesting right atrial pressure of 3 mmHg.      Neuro/Psych  PSYCHIATRIC  DISORDERS  Depression    negative neurological ROS     GI/Hepatic ,neg GERD  ,,(+)     (-) substance abuse    Endo/Other  diabetes  GLP1 agonist held > 7 days. Denies GI symptoms today  Renal/GU negative Renal ROS     Musculoskeletal   Abdominal  (+) + obese  Peds  Hematology   Anesthesia Other Findings Past Medical History: No date: Chronic bronchitis No date: HTN (hypertension) No date: NICM (nonischemic cardiomyopathy) (HCC) 12/04/2015: Obesity (BMI 30-39.9) 08/19/2015: OSA (obstructive sleep apnea)     Comment:  Severe with AHI 64/hr now on CPAP at 13cm H2O No date: PAF (paroxysmal atrial fibrillation) (HCC) No date: Systolic CHF, chronic (HCC)  Reproductive/Obstetrics                              Anesthesia Physical Anesthesia Plan  ASA: 3  Anesthesia Plan: General   Post-op Pain Management: Minimal or no pain anticipated   Induction: Intravenous  PONV Risk Score and Plan: 2 and Propofol  infusion, TIVA and Ondansetron   Airway Management Planned: Nasal Cannula  Additional Equipment: None  Intra-op Plan:   Post-operative Plan:   Informed Consent: I have reviewed the patients History and Physical, chart, labs and discussed the procedure including the risks, benefits and alternatives for the proposed anesthesia with the patient or authorized representative who has indicated his/her understanding and acceptance.     Dental advisory given  Plan Discussed with: CRNA and Surgeon  Anesthesia Plan Comments: (Discussed risks of anesthesia with patient, including possibility of difficulty with spontaneous ventilation under anesthesia necessitating airway intervention,  PONV, and rare risks such as cardiac or respiratory or neurological events, and allergic reactions. Discussed the role of CRNA in patient's perioperative care. Patient understands.)        Anesthesia Quick Evaluation

## 2023-12-10 NOTE — Interval H&P Note (Signed)
 History and Physical Interval Note:  12/10/2023 8:40 AM  Harold Park  has presented today for surgery, with the diagnosis of AFLUTTER.  The various methods of treatment have been discussed with the patient and family. After consideration of risks, benefits and other options for treatment, the patient has consented to  Procedure(s): CARDIOVERSION (N/A) as a surgical intervention.  The patient's history has been reviewed, patient examined, no change in status, stable for surgery.  I have reviewed the patient's chart and labs.  Questions were answered to the patient's satisfaction.    Informed Consent   Shared Decision Making/Informed Consent The risks (stroke, cardiac arrhythmias rarely resulting in the need for a temporary or permanent pacemaker, skin irritation or burns and complications associated with conscious sedation including aspiration, arrhythmia, respiratory failure and death), benefits (restoration of normal sinus rhythm) and alternatives of a direct current cardioversion were explained in detail to [name] and he agrees to proceed.      No missed doses of Eliquis .  Contact person: Wife Rollo [phone]  Madonna Large

## 2023-12-11 ENCOUNTER — Emergency Department (HOSPITAL_BASED_OUTPATIENT_CLINIC_OR_DEPARTMENT_OTHER): Admitting: Radiology

## 2023-12-11 ENCOUNTER — Other Ambulatory Visit: Payer: Self-pay

## 2023-12-11 ENCOUNTER — Encounter (HOSPITAL_COMMUNITY): Payer: Self-pay | Admitting: Cardiology

## 2023-12-11 ENCOUNTER — Encounter: Payer: Self-pay | Admitting: Cardiology

## 2023-12-11 ENCOUNTER — Inpatient Hospital Stay (HOSPITAL_BASED_OUTPATIENT_CLINIC_OR_DEPARTMENT_OTHER)
Admission: EM | Admit: 2023-12-11 | Discharge: 2024-01-09 | DRG: 286 | Disposition: E | Attending: Cardiology | Admitting: Cardiology

## 2023-12-11 DIAGNOSIS — R140 Abdominal distension (gaseous): Secondary | ICD-10-CM | POA: Diagnosis present

## 2023-12-11 DIAGNOSIS — N179 Acute kidney failure, unspecified: Secondary | ICD-10-CM | POA: Diagnosis present

## 2023-12-11 DIAGNOSIS — E1122 Type 2 diabetes mellitus with diabetic chronic kidney disease: Secondary | ICD-10-CM | POA: Diagnosis present

## 2023-12-11 DIAGNOSIS — I484 Atypical atrial flutter: Secondary | ICD-10-CM | POA: Diagnosis present

## 2023-12-11 DIAGNOSIS — I493 Ventricular premature depolarization: Secondary | ICD-10-CM | POA: Diagnosis not present

## 2023-12-11 DIAGNOSIS — I340 Nonrheumatic mitral (valve) insufficiency: Secondary | ICD-10-CM | POA: Diagnosis present

## 2023-12-11 DIAGNOSIS — K7200 Acute and subacute hepatic failure without coma: Secondary | ICD-10-CM | POA: Diagnosis not present

## 2023-12-11 DIAGNOSIS — I130 Hypertensive heart and chronic kidney disease with heart failure and stage 1 through stage 4 chronic kidney disease, or unspecified chronic kidney disease: Principal | ICD-10-CM | POA: Diagnosis present

## 2023-12-11 DIAGNOSIS — Z79631 Long term (current) use of antimetabolite agent: Secondary | ICD-10-CM

## 2023-12-11 DIAGNOSIS — Z803 Family history of malignant neoplasm of breast: Secondary | ICD-10-CM

## 2023-12-11 DIAGNOSIS — I4819 Other persistent atrial fibrillation: Secondary | ICD-10-CM | POA: Diagnosis present

## 2023-12-11 DIAGNOSIS — I483 Typical atrial flutter: Secondary | ICD-10-CM | POA: Diagnosis present

## 2023-12-11 DIAGNOSIS — G4730 Sleep apnea, unspecified: Secondary | ICD-10-CM | POA: Diagnosis present

## 2023-12-11 DIAGNOSIS — E440 Moderate protein-calorie malnutrition: Secondary | ICD-10-CM | POA: Insufficient documentation

## 2023-12-11 DIAGNOSIS — I5082 Biventricular heart failure: Secondary | ICD-10-CM | POA: Diagnosis present

## 2023-12-11 DIAGNOSIS — K7682 Hepatic encephalopathy: Secondary | ICD-10-CM | POA: Diagnosis not present

## 2023-12-11 DIAGNOSIS — Z79899 Other long term (current) drug therapy: Secondary | ICD-10-CM

## 2023-12-11 DIAGNOSIS — E1159 Type 2 diabetes mellitus with other circulatory complications: Secondary | ICD-10-CM | POA: Diagnosis present

## 2023-12-11 DIAGNOSIS — Z7984 Long term (current) use of oral hypoglycemic drugs: Secondary | ICD-10-CM

## 2023-12-11 DIAGNOSIS — Z1152 Encounter for screening for COVID-19: Secondary | ICD-10-CM

## 2023-12-11 DIAGNOSIS — Z66 Do not resuscitate: Secondary | ICD-10-CM | POA: Diagnosis not present

## 2023-12-11 DIAGNOSIS — Z9081 Acquired absence of spleen: Secondary | ICD-10-CM

## 2023-12-11 DIAGNOSIS — G4733 Obstructive sleep apnea (adult) (pediatric): Secondary | ICD-10-CM | POA: Diagnosis present

## 2023-12-11 DIAGNOSIS — I480 Paroxysmal atrial fibrillation: Secondary | ICD-10-CM | POA: Diagnosis present

## 2023-12-11 DIAGNOSIS — E871 Hypo-osmolality and hyponatremia: Secondary | ICD-10-CM | POA: Diagnosis not present

## 2023-12-11 DIAGNOSIS — D751 Secondary polycythemia: Secondary | ICD-10-CM | POA: Diagnosis present

## 2023-12-11 DIAGNOSIS — I5023 Acute on chronic systolic (congestive) heart failure: Secondary | ICD-10-CM | POA: Diagnosis present

## 2023-12-11 DIAGNOSIS — G928 Other toxic encephalopathy: Secondary | ICD-10-CM | POA: Diagnosis not present

## 2023-12-11 DIAGNOSIS — I77810 Thoracic aortic ectasia: Secondary | ICD-10-CM | POA: Diagnosis present

## 2023-12-11 DIAGNOSIS — N1831 Chronic kidney disease, stage 3a: Secondary | ICD-10-CM | POA: Diagnosis present

## 2023-12-11 DIAGNOSIS — E872 Acidosis, unspecified: Secondary | ICD-10-CM | POA: Diagnosis present

## 2023-12-11 DIAGNOSIS — I97191 Other postprocedural cardiac functional disturbances following other surgery: Secondary | ICD-10-CM | POA: Diagnosis present

## 2023-12-11 DIAGNOSIS — D84821 Immunodeficiency due to drugs: Secondary | ICD-10-CM | POA: Diagnosis present

## 2023-12-11 DIAGNOSIS — Z91030 Bee allergy status: Secondary | ICD-10-CM

## 2023-12-11 DIAGNOSIS — E66812 Obesity, class 2: Secondary | ICD-10-CM | POA: Diagnosis present

## 2023-12-11 DIAGNOSIS — Z8679 Personal history of other diseases of the circulatory system: Secondary | ICD-10-CM

## 2023-12-11 DIAGNOSIS — Z8249 Family history of ischemic heart disease and other diseases of the circulatory system: Secondary | ICD-10-CM

## 2023-12-11 DIAGNOSIS — I428 Other cardiomyopathies: Secondary | ICD-10-CM | POA: Diagnosis present

## 2023-12-11 DIAGNOSIS — N1832 Chronic kidney disease, stage 3b: Secondary | ICD-10-CM | POA: Diagnosis present

## 2023-12-11 DIAGNOSIS — I452 Bifascicular block: Secondary | ICD-10-CM | POA: Diagnosis not present

## 2023-12-11 DIAGNOSIS — I509 Heart failure, unspecified: Secondary | ICD-10-CM

## 2023-12-11 DIAGNOSIS — J189 Pneumonia, unspecified organism: Secondary | ICD-10-CM | POA: Diagnosis not present

## 2023-12-11 DIAGNOSIS — Z7985 Long-term (current) use of injectable non-insulin antidiabetic drugs: Secondary | ICD-10-CM

## 2023-12-11 DIAGNOSIS — F32A Depression, unspecified: Secondary | ICD-10-CM | POA: Diagnosis present

## 2023-12-11 DIAGNOSIS — E669 Obesity, unspecified: Secondary | ICD-10-CM | POA: Diagnosis present

## 2023-12-11 DIAGNOSIS — J9601 Acute respiratory failure with hypoxia: Principal | ICD-10-CM | POA: Diagnosis present

## 2023-12-11 DIAGNOSIS — R000 Tachycardia, unspecified: Secondary | ICD-10-CM

## 2023-12-11 DIAGNOSIS — I10 Essential (primary) hypertension: Secondary | ICD-10-CM | POA: Diagnosis present

## 2023-12-11 DIAGNOSIS — Z6835 Body mass index (BMI) 35.0-35.9, adult: Secondary | ICD-10-CM

## 2023-12-11 DIAGNOSIS — I4892 Unspecified atrial flutter: Secondary | ICD-10-CM

## 2023-12-11 DIAGNOSIS — Z87891 Personal history of nicotine dependence: Secondary | ICD-10-CM

## 2023-12-11 DIAGNOSIS — F05 Delirium due to known physiological condition: Secondary | ICD-10-CM | POA: Diagnosis not present

## 2023-12-11 DIAGNOSIS — R570 Cardiogenic shock: Secondary | ICD-10-CM | POA: Diagnosis present

## 2023-12-11 DIAGNOSIS — Y848 Other medical procedures as the cause of abnormal reaction of the patient, or of later complication, without mention of misadventure at the time of the procedure: Secondary | ICD-10-CM | POA: Diagnosis present

## 2023-12-11 DIAGNOSIS — I5084 End stage heart failure: Secondary | ICD-10-CM | POA: Diagnosis present

## 2023-12-11 DIAGNOSIS — I472 Ventricular tachycardia, unspecified: Secondary | ICD-10-CM | POA: Diagnosis not present

## 2023-12-11 DIAGNOSIS — I440 Atrioventricular block, first degree: Secondary | ICD-10-CM | POA: Diagnosis not present

## 2023-12-11 DIAGNOSIS — R34 Anuria and oliguria: Secondary | ICD-10-CM | POA: Diagnosis not present

## 2023-12-11 DIAGNOSIS — R590 Localized enlarged lymph nodes: Secondary | ICD-10-CM | POA: Diagnosis present

## 2023-12-11 DIAGNOSIS — R578 Other shock: Secondary | ICD-10-CM | POA: Diagnosis not present

## 2023-12-11 DIAGNOSIS — J42 Unspecified chronic bronchitis: Secondary | ICD-10-CM | POA: Diagnosis present

## 2023-12-11 DIAGNOSIS — D696 Thrombocytopenia, unspecified: Secondary | ICD-10-CM | POA: Diagnosis not present

## 2023-12-11 DIAGNOSIS — D869 Sarcoidosis, unspecified: Secondary | ICD-10-CM | POA: Diagnosis present

## 2023-12-11 DIAGNOSIS — D6869 Other thrombophilia: Secondary | ICD-10-CM | POA: Diagnosis present

## 2023-12-11 DIAGNOSIS — Z515 Encounter for palliative care: Secondary | ICD-10-CM

## 2023-12-11 DIAGNOSIS — D8689 Sarcoidosis of other sites: Secondary | ICD-10-CM | POA: Diagnosis present

## 2023-12-11 DIAGNOSIS — Z7901 Long term (current) use of anticoagulants: Secondary | ICD-10-CM

## 2023-12-11 DIAGNOSIS — E1165 Type 2 diabetes mellitus with hyperglycemia: Secondary | ICD-10-CM | POA: Diagnosis present

## 2023-12-11 LAB — CBC
HCT: 52 % (ref 39.0–52.0)
Hemoglobin: 17.1 g/dL — ABNORMAL HIGH (ref 13.0–17.0)
MCH: 29.4 pg (ref 26.0–34.0)
MCHC: 32.9 g/dL (ref 30.0–36.0)
MCV: 89.3 fL (ref 80.0–100.0)
Platelets: 243 K/uL (ref 150–400)
RBC: 5.82 MIL/uL — ABNORMAL HIGH (ref 4.22–5.81)
RDW: 14.6 % (ref 11.5–15.5)
WBC: 13.1 K/uL — ABNORMAL HIGH (ref 4.0–10.5)
nRBC: 0.2 % (ref 0.0–0.2)

## 2023-12-11 LAB — RESP PANEL BY RT-PCR (RSV, FLU A&B, COVID)  RVPGX2
Influenza A by PCR: NEGATIVE
Influenza B by PCR: NEGATIVE
Resp Syncytial Virus by PCR: NEGATIVE
SARS Coronavirus 2 by RT PCR: NEGATIVE

## 2023-12-11 LAB — BASIC METABOLIC PANEL WITH GFR
Anion gap: 14 (ref 5–15)
BUN: 29 mg/dL — ABNORMAL HIGH (ref 6–20)
CO2: 21 mmol/L — ABNORMAL LOW (ref 22–32)
Calcium: 9.6 mg/dL (ref 8.9–10.3)
Chloride: 97 mmol/L — ABNORMAL LOW (ref 98–111)
Creatinine, Ser: 1.74 mg/dL — ABNORMAL HIGH (ref 0.61–1.24)
GFR, Estimated: 45 mL/min — ABNORMAL LOW (ref >60)
Glucose, Bld: 140 mg/dL — ABNORMAL HIGH (ref 70–99)
Potassium: 4.8 mmol/L (ref 3.5–5.1)
Sodium: 132 mmol/L — ABNORMAL LOW (ref 135–145)

## 2023-12-11 LAB — MAGNESIUM: Magnesium: 2.5 mg/dL — ABNORMAL HIGH (ref 1.7–2.4)

## 2023-12-11 LAB — TROPONIN T, HIGH SENSITIVITY
Troponin T High Sensitivity: 60 ng/L — ABNORMAL HIGH (ref 0–19)
Troponin T High Sensitivity: 56 ng/L — ABNORMAL HIGH (ref 0–19)

## 2023-12-11 LAB — PRO BRAIN NATRIURETIC PEPTIDE: Pro Brain Natriuretic Peptide: 6368 pg/mL — ABNORMAL HIGH (ref <300.0)

## 2023-12-11 MED ORDER — FUROSEMIDE 10 MG/ML IJ SOLN
40.0000 mg | Freq: Once | INTRAMUSCULAR | Status: AC
  Administered 2023-12-11: 40 mg via INTRAVENOUS
  Filled 2023-12-11: qty 4

## 2023-12-11 NOTE — ED Provider Notes (Signed)
 [place] EMERGENCY DEPARTMENT AT [facility] Provider Note   CSN: [id number] Arrival date & time: 12/11/23  1728     Patient presents with: No chief complaint on file.   Harold Park is a 58 y.o. male.  With a history of atrial flutter on Eliquis , heart failure, nonischemic cardiomyopathy and type 2 diabetes who presents to the ED for shortness of breath.  Patient underwent electrocardioversion with Dr.Tolia  Yesterday for atrial flutter.  He tolerated the procedure well and was discharged back home in stable condition and sinus tachycardia low 100s.  He became concerned today that his heart rate was up to 115 he had also reported an 8 pound weight gain in the last 24 hours and increased shortness of breath.  No chest pain nausea vomiting fevers chills   HPI     Prior to Admission medications   Medication Sig Start Date End Date Taking? Authorizing Provider  albuterol  (VENTOLIN  HFA) 108 (90 Base) MCG/ACT inhaler TAKE 2 PUFFS BY MOUTH EVERY 6 HOURS AS NEEDED FOR WHEEZE OR SHORTNESS OF BREATH 02/24/22   [name], MD  amiodarone  (PACERONE ) 200 MG tablet Take 2 tablets (400 mg total) by mouth 2 (two) times daily. 12/02/23   [name], [name], MD  amiodarone  (PACERONE ) 200 MG tablet Take 1 tablet (200 mg total) by mouth daily. 12/02/23   [name], [name], MD  apixaban  (ELIQUIS ) 5 MG TABS tablet TAKE 1 TABLET BY MOUTH TWICE A DAY 07/23/23   Bensimhon, [name], MD  digoxin  (LANOXIN ) 0.125 MG tablet Take 1 tablet (0.125 mg total) by mouth daily. 06/29/23   Bensimhon, [name], MD  DULoxetine  (CYMBALTA ) 20 MG capsule TAKE 1 CAPSULE BY MOUTH EVERY DAY 12/02/22   Bensimhon, [name], MD  empagliflozin  (JARDIANCE ) 10 MG TABS tablet Take 1 tablet (10 mg total) by mouth daily. 07/07/23   [name], NP  losartan  (COZAAR ) 25 MG tablet Take 1 tablet (25 mg total) by mouth daily. 08/06/23   Bensimhon, [name], MD  Magnesium 250 MG TABS Take 250 mg by mouth every evening. Gummy     [provider]  potassium chloride  SA (KLOR-CON  M) 20 MEQ tablet Take 2 tablets (40 mEq total) by mouth 2 (two) times daily. 12/03/22   [name], [name], MD  spironolactone  (ALDACTONE ) 25 MG tablet Take 1 tablet (25 mg total) by mouth daily. 11/27/23   Bensimhon, [name], MD  tamsulosin  (FLOMAX ) 0.4 MG CAPS capsule Take 1 capsule (0.4 mg total) by mouth daily after supper. 08/06/23   Bensimhon, [name], MD  tirzepatide  (ZEPBOUND ) 12.5 MG/0.5ML Pen Inject 12.5 mg into the skin once a week. 10/28/23   [name], MD  torsemide  (DEMADEX ) 20 MG tablet Take 2 tablets (40 mg total) by mouth 2 (two) times daily. 08/05/23   [name], [name], MD    Allergies: Bee venom    Review of Systems  Updated Vital Signs BP 110/88 (BP Location: Right Arm)   Pulse (!) 118   Temp (!) 97.5 F (36.4 C)   Resp 20   SpO2 99%   Physical Exam Vitals and nursing note reviewed.  HENT:     Head: Normocephalic and atraumatic.  Eyes:     Pupils: Pupils are equal, round, and reactive to light.  Cardiovascular:     Rate and Rhythm: Regular rhythm. Tachycardia present.  Pulmonary:     Effort: Pulmonary effort is normal.     Breath sounds: Normal breath sounds.  Abdominal:     Palpations: Abdomen is soft.     Tenderness: There is no abdominal tenderness.  Skin:    General: Skin is warm and dry.  Neurological:     Mental Status: He is alert.  Psychiatric:        Mood and Affect: Mood normal.     (all labs ordered are listed, but only abnormal results are displayed) Labs Reviewed  BASIC METABOLIC PANEL WITH GFR - Abnormal; Notable for the following components:      Result Value   Sodium 132 (*)    Chloride 97 (*)    CO2 21 (*)    Glucose, Bld 140 (*)    BUN 29 (*)    Creatinine, Ser 1.74 (*)    GFR, Estimated 45 (*)    All other components within normal limits  CBC - Abnormal; Notable for the following components:   WBC 13.1 (*)    RBC 5.82 (*)    Hemoglobin 17.1 (*)    All other  components within normal limits  PRO BRAIN NATRIURETIC PEPTIDE - Abnormal; Notable for the following components:   Pro Brain Natriuretic Peptide 6,368.0 (*)    All other components within normal limits  MAGNESIUM - Abnormal; Notable for the following components:   Magnesium 2.5 (*)    All other components within normal limits  TROPONIN T, HIGH SENSITIVITY - Abnormal; Notable for the following components:   Troponin T High Sensitivity 60 (*)    All other components within normal limits  RESP PANEL BY RT-PCR (RSV, FLU A&B, COVID)  RVPGX2  TROPONIN T, HIGH SENSITIVITY    EKG: EKG Interpretation Date/Time:  Friday December 11 2023 17:33:06 EDT Ventricular Rate:  110 PR Interval:  240 QRS Duration:  134 QT Interval:  314 QTC Calculation: 424 R Axis:   251  Text Interpretation: Sinus tachycardia with 1st degree A-V block with Fusion complexes Possible Left atrial enlargement Right bundle branch block Possible Lateral infarct , age undetermined T wave abnormality, consider inferior ischemia Abnormal ECG When compared with ECG of 10-Dec-2023 09:07, Fusion complexes are now Present Right bundle branch block has replaced Non-specific intra-ventricular conduction block Confirmed by Pamella Sharper [phone]) on 12/11/2023 7:49:45 PM  Radiology: [facility] Chest 2 View Result Date: 12/11/2023 CLINICAL DATA:  Chest pain. EXAM: CHEST - 2 VIEW COMPARISON:  November 03, 2022. FINDINGS: Stable cardiomegaly with mild central pulmonary vascular congestion. No consolidative process. Bony thorax is unremarkable. IMPRESSION: Stable cardiomegaly with mild central pulmonary vascular congestion. Electronically Signed   By: Lynwood Landy Raddle M.D.   On: 12/11/2023 18:11   EP STUDY Result Date: 12/10/2023 See surgical note for result.    Procedures   Medications Ordered in the ED  furosemide  (LASIX ) injection 40 mg (40 mg Intravenous Given 12/11/23 1857)    Clinical Course as of 12/11/23 1949  Fri Dec 11, 2023  1948  Initial BNP significantly elevated over 6000.  Initial troponin of 60 significantly elevated not surprisingly after cardioversion yesterday and acute heart failure exacerbation.  Will obtain delta troponin here.  No active chest pain.  EKG shows sinus tachycardia.  Chest x-ray shows pulmonary congestion.  Patient stable on 3 L nasal cannula.  Discussed with admitting hospitalist accept patient for admission for heart failure exacerbation. [MP]    Clinical Course User Index [MP] Pamella Sharper LABOR, DO  Medical Decision Making 58 year old male with history as above including electrocardioversion by cardiology yesterday presenting for shortness of breath weight gain.  Sinus tachycardia in the low 100s.  He was discharged after cardioversion sinus tachycardia.  No ischemic changes.  No active chest pain.  Feeling short of breath.  Increased peripheral edema.  Most concerning for heart failure exacerbation.  Will obtain laboratory workup including high-sensitivity troponin delta troponin BNP.  Admission likely.  Amount and/or Complexity of Data Reviewed Labs: ordered. Radiology: ordered.  Risk Prescription drug management. Decision regarding hospitalization.        Final diagnoses:  Acute hypoxemic respiratory failure (HCC)  Acute on chronic congestive heart failure, unspecified heart failure type (HCC)  Sinus tachycardia  History of atrial flutter    ED Discharge Orders     None          Pamella Ozell LABOR, DO 12/11/23 1950

## 2023-12-11 NOTE — Plan of Care (Signed)
 Plan of Care Note for accepted transfer   Patient name: Harold Park FMW:[id number] DOB: February 05, 1966  Facility requesting transfer: [facility] ED Requesting Provider: Dr. Pamella Reason for transfer: Acute on chronic HFrEF Facility course: 58 year old male with history of hypertension, OSA, HFrEF, persistent A-fib/flutter underwent cardioversion yesterday presenting with complaints of shortness of breath and weight gain.  Noted to be in sinus tachycardia with heart rate in the 110s.  Oxygen saturation in the low 90s on room air, placed on 3 L [place].  Chest x-ray showing mild central pulmonary vascular congestion.  proBNP [id number].  Troponin 60 but no active chest pain reported.  Creatinine 1.7 (baseline 1.2).  IV Lasix  40 mg given.  Plan of care: The patient is accepted for admission to Progressive unit at [facility].  [facility] will assume care on arrival to accepting facility. Until arrival, care as per EDP. However, [facility] available 24/7 for questions and assistance.  Check [email or web address] for on-call coverage.  Nursing staff, please call [facility] Admits & Consults System-Wide number under Amion on patient's arrival so appropriate admitting provider can evaluate the pt.

## 2023-12-11 NOTE — ED Notes (Signed)
 Pt placed on 3 lpm [place] per DR Pamella.

## 2023-12-11 NOTE — ED Triage Notes (Signed)
 Cardioversion yesterday. Sent home still tachycardic. This AM feeling CP, SOB, and over 7lbs heavier, swelling in extremities.

## 2023-12-12 ENCOUNTER — Other Ambulatory Visit: Payer: Self-pay

## 2023-12-12 ENCOUNTER — Encounter (HOSPITAL_COMMUNITY): Payer: Self-pay

## 2023-12-12 ENCOUNTER — Inpatient Hospital Stay (HOSPITAL_COMMUNITY)

## 2023-12-12 DIAGNOSIS — I472 Ventricular tachycardia, unspecified: Secondary | ICD-10-CM | POA: Diagnosis not present

## 2023-12-12 DIAGNOSIS — I340 Nonrheumatic mitral (valve) insufficiency: Secondary | ICD-10-CM | POA: Diagnosis present

## 2023-12-12 DIAGNOSIS — Z1152 Encounter for screening for COVID-19: Secondary | ICD-10-CM | POA: Diagnosis not present

## 2023-12-12 DIAGNOSIS — E1122 Type 2 diabetes mellitus with diabetic chronic kidney disease: Secondary | ICD-10-CM | POA: Diagnosis present

## 2023-12-12 DIAGNOSIS — I480 Paroxysmal atrial fibrillation: Secondary | ICD-10-CM | POA: Diagnosis not present

## 2023-12-12 DIAGNOSIS — Z7189 Other specified counseling: Secondary | ICD-10-CM | POA: Diagnosis not present

## 2023-12-12 DIAGNOSIS — Y848 Other medical procedures as the cause of abnormal reaction of the patient, or of later complication, without mention of misadventure at the time of the procedure: Secondary | ICD-10-CM | POA: Diagnosis present

## 2023-12-12 DIAGNOSIS — I5031 Acute diastolic (congestive) heart failure: Secondary | ICD-10-CM | POA: Diagnosis not present

## 2023-12-12 DIAGNOSIS — I4819 Other persistent atrial fibrillation: Secondary | ICD-10-CM | POA: Diagnosis present

## 2023-12-12 DIAGNOSIS — I5023 Acute on chronic systolic (congestive) heart failure: Secondary | ICD-10-CM | POA: Diagnosis present

## 2023-12-12 DIAGNOSIS — I130 Hypertensive heart and chronic kidney disease with heart failure and stage 1 through stage 4 chronic kidney disease, or unspecified chronic kidney disease: Secondary | ICD-10-CM | POA: Diagnosis present

## 2023-12-12 DIAGNOSIS — J42 Unspecified chronic bronchitis: Secondary | ICD-10-CM | POA: Diagnosis present

## 2023-12-12 DIAGNOSIS — I4510 Unspecified right bundle-branch block: Secondary | ICD-10-CM | POA: Diagnosis not present

## 2023-12-12 DIAGNOSIS — F32A Depression, unspecified: Secondary | ICD-10-CM | POA: Diagnosis present

## 2023-12-12 DIAGNOSIS — N1832 Chronic kidney disease, stage 3b: Secondary | ICD-10-CM | POA: Diagnosis present

## 2023-12-12 DIAGNOSIS — R578 Other shock: Secondary | ICD-10-CM | POA: Diagnosis not present

## 2023-12-12 DIAGNOSIS — E66812 Obesity, class 2: Secondary | ICD-10-CM

## 2023-12-12 DIAGNOSIS — Z66 Do not resuscitate: Secondary | ICD-10-CM | POA: Diagnosis not present

## 2023-12-12 DIAGNOSIS — N1831 Chronic kidney disease, stage 3a: Secondary | ICD-10-CM | POA: Diagnosis not present

## 2023-12-12 DIAGNOSIS — Z6835 Body mass index (BMI) 35.0-35.9, adult: Secondary | ICD-10-CM | POA: Diagnosis not present

## 2023-12-12 DIAGNOSIS — J9601 Acute respiratory failure with hypoxia: Secondary | ICD-10-CM | POA: Diagnosis present

## 2023-12-12 DIAGNOSIS — D84821 Immunodeficiency due to drugs: Secondary | ICD-10-CM | POA: Diagnosis present

## 2023-12-12 DIAGNOSIS — I77810 Thoracic aortic ectasia: Secondary | ICD-10-CM | POA: Diagnosis present

## 2023-12-12 DIAGNOSIS — D869 Sarcoidosis, unspecified: Secondary | ICD-10-CM

## 2023-12-12 DIAGNOSIS — N179 Acute kidney failure, unspecified: Secondary | ICD-10-CM | POA: Diagnosis present

## 2023-12-12 DIAGNOSIS — I10 Essential (primary) hypertension: Secondary | ICD-10-CM

## 2023-12-12 DIAGNOSIS — E1159 Type 2 diabetes mellitus with other circulatory complications: Secondary | ICD-10-CM

## 2023-12-12 DIAGNOSIS — I4821 Permanent atrial fibrillation: Secondary | ICD-10-CM | POA: Diagnosis not present

## 2023-12-12 DIAGNOSIS — I4892 Unspecified atrial flutter: Secondary | ICD-10-CM | POA: Diagnosis not present

## 2023-12-12 DIAGNOSIS — R570 Cardiogenic shock: Secondary | ICD-10-CM | POA: Diagnosis present

## 2023-12-12 DIAGNOSIS — R0609 Other forms of dyspnea: Secondary | ICD-10-CM | POA: Diagnosis not present

## 2023-12-12 DIAGNOSIS — D696 Thrombocytopenia, unspecified: Secondary | ICD-10-CM | POA: Diagnosis not present

## 2023-12-12 DIAGNOSIS — I4891 Unspecified atrial fibrillation: Secondary | ICD-10-CM | POA: Diagnosis not present

## 2023-12-12 DIAGNOSIS — K7200 Acute and subacute hepatic failure without coma: Secondary | ICD-10-CM | POA: Diagnosis not present

## 2023-12-12 DIAGNOSIS — I483 Typical atrial flutter: Secondary | ICD-10-CM | POA: Diagnosis present

## 2023-12-12 DIAGNOSIS — Z515 Encounter for palliative care: Secondary | ICD-10-CM | POA: Diagnosis not present

## 2023-12-12 DIAGNOSIS — I5082 Biventricular heart failure: Secondary | ICD-10-CM | POA: Diagnosis not present

## 2023-12-12 DIAGNOSIS — G928 Other toxic encephalopathy: Secondary | ICD-10-CM | POA: Diagnosis not present

## 2023-12-12 DIAGNOSIS — R34 Anuria and oliguria: Secondary | ICD-10-CM | POA: Diagnosis not present

## 2023-12-12 LAB — ECHOCARDIOGRAM COMPLETE
Area-P 1/2: 7.44 cm2
Est EF: <10
Height: 77 in
S' Lateral: 6.4 cm
Weight: 4825.6 [oz_av]

## 2023-12-12 LAB — CBC
HCT: 48.1 % (ref 39.0–52.0)
HCT: 47.1 % (ref 39.0–52.0)
Hemoglobin: 16 g/dL (ref 13.0–17.0)
Hemoglobin: 15.7 g/dL (ref 13.0–17.0)
MCH: 29.2 pg (ref 26.0–34.0)
MCH: 29.1 pg (ref 26.0–34.0)
MCHC: 33.3 g/dL (ref 30.0–36.0)
MCHC: 33.3 g/dL (ref 30.0–36.0)
MCV: 87.8 fL (ref 80.0–100.0)
MCV: 87.4 fL (ref 80.0–100.0)
Platelets: 231 K/uL (ref 150–400)
Platelets: 242 K/uL (ref 150–400)
RBC: 5.48 MIL/uL (ref 4.22–5.81)
RBC: 5.39 MIL/uL (ref 4.22–5.81)
RDW: 14.1 % (ref 11.5–15.5)
RDW: 14 % (ref 11.5–15.5)
WBC: 12.2 K/uL — ABNORMAL HIGH (ref 4.0–10.5)
WBC: 12.7 K/uL — ABNORMAL HIGH (ref 4.0–10.5)
nRBC: 0.2 % (ref 0.0–0.2)
nRBC: 0.2 % (ref 0.0–0.2)

## 2023-12-12 LAB — BLOOD GAS, VENOUS
Acid-base deficit: 2.2 mmol/L — ABNORMAL HIGH (ref 0.0–2.0)
Bicarbonate: 22.4 mmol/L (ref 20.0–28.0)
O2 Saturation: 93.1 %
Patient temperature: 36.4
pCO2, Ven: 36 mmHg — ABNORMAL LOW (ref 44–60)
pH, Ven: 7.4 (ref 7.25–7.43)
pO2, Ven: 62 mmHg — ABNORMAL HIGH (ref 32–45)

## 2023-12-12 LAB — COMPREHENSIVE METABOLIC PANEL WITH GFR
ALT: 52 U/L — ABNORMAL HIGH (ref 0–44)
ALT: 51 U/L — ABNORMAL HIGH (ref 0–44)
AST: 49 U/L — ABNORMAL HIGH (ref 15–41)
AST: 45 U/L — ABNORMAL HIGH (ref 15–41)
Albumin: 3 g/dL — ABNORMAL LOW (ref 3.5–5.0)
Albumin: 3 g/dL — ABNORMAL LOW (ref 3.5–5.0)
Alkaline Phosphatase: 56 U/L (ref 38–126)
Alkaline Phosphatase: 55 U/L (ref 38–126)
Anion gap: 11 (ref 5–15)
Anion gap: 10 (ref 5–15)
BUN: 31 mg/dL — ABNORMAL HIGH (ref 6–20)
BUN: 29 mg/dL — ABNORMAL HIGH (ref 6–20)
CO2: 19 mmol/L — ABNORMAL LOW (ref 22–32)
CO2: 23 mmol/L (ref 22–32)
Calcium: 8.3 mg/dL — ABNORMAL LOW (ref 8.9–10.3)
Calcium: 8.3 mg/dL — ABNORMAL LOW (ref 8.9–10.3)
Chloride: 101 mmol/L (ref 98–111)
Chloride: 100 mmol/L (ref 98–111)
Creatinine, Ser: 1.65 mg/dL — ABNORMAL HIGH (ref 0.61–1.24)
Creatinine, Ser: 1.65 mg/dL — ABNORMAL HIGH (ref 0.61–1.24)
GFR, Estimated: 48 mL/min — ABNORMAL LOW (ref >60)
GFR, Estimated: 48 mL/min — ABNORMAL LOW (ref >60)
Glucose, Bld: 135 mg/dL — ABNORMAL HIGH (ref 70–99)
Glucose, Bld: 137 mg/dL — ABNORMAL HIGH (ref 70–99)
Potassium: 4.4 mmol/L (ref 3.5–5.1)
Potassium: 4 mmol/L (ref 3.5–5.1)
Sodium: 131 mmol/L — ABNORMAL LOW (ref 135–145)
Sodium: 133 mmol/L — ABNORMAL LOW (ref 135–145)
Total Bilirubin: 1.4 mg/dL — ABNORMAL HIGH (ref 0.0–1.2)
Total Bilirubin: 1.1 mg/dL (ref 0.0–1.2)
Total Protein: 5.9 g/dL — ABNORMAL LOW (ref 6.5–8.1)
Total Protein: 6.2 g/dL — ABNORMAL LOW (ref 6.5–8.1)

## 2023-12-12 LAB — HIV ANTIBODY (ROUTINE TESTING W REFLEX): HIV Screen 4th Generation wRfx: NONREACTIVE

## 2023-12-12 LAB — COOXEMETRY PANEL
Carboxyhemoglobin: 0.9 % (ref 0.5–1.5)
Methemoglobin: 0.8 % (ref 0.0–1.5)
O2 Saturation: 57.3 %
Total hemoglobin: 14.1 g/dL (ref 12.0–16.0)

## 2023-12-12 LAB — BASIC METABOLIC PANEL WITH GFR
Anion gap: 11 (ref 5–15)
BUN: 29 mg/dL — ABNORMAL HIGH (ref 6–20)
CO2: 21 mmol/L — ABNORMAL LOW (ref 22–32)
Calcium: 8.8 mg/dL — ABNORMAL LOW (ref 8.9–10.3)
Chloride: 100 mmol/L (ref 98–111)
Creatinine, Ser: 1.81 mg/dL — ABNORMAL HIGH (ref 0.61–1.24)
GFR, Estimated: 43 mL/min — ABNORMAL LOW (ref >60)
Glucose, Bld: 159 mg/dL — ABNORMAL HIGH (ref 70–99)
Potassium: 4.9 mmol/L (ref 3.5–5.1)
Sodium: 132 mmol/L — ABNORMAL LOW (ref 135–145)

## 2023-12-12 LAB — LACTIC ACID, PLASMA
Lactic Acid, Venous: 2.1 mmol/L (ref 0.5–1.9)
Lactic Acid, Venous: 1.8 mmol/L (ref 0.5–1.9)
Lactic Acid, Venous: 1.3 mmol/L (ref 0.5–1.9)
Lactic Acid, Venous: 1.7 mmol/L (ref 0.5–1.9)

## 2023-12-12 LAB — MAGNESIUM
Magnesium: 2.3 mg/dL (ref 1.7–2.4)
Magnesium: 2.2 mg/dL (ref 1.7–2.4)

## 2023-12-12 LAB — PHOSPHORUS: Phosphorus: 4.4 mg/dL (ref 2.5–4.6)

## 2023-12-12 LAB — GLUCOSE, CAPILLARY: Glucose-Capillary: 163 mg/dL — ABNORMAL HIGH (ref 70–99)

## 2023-12-12 MED ORDER — SODIUM CHLORIDE 0.9% FLUSH
3.0000 mL | Freq: Two times a day (BID) | INTRAVENOUS | Status: DC
  Administered 2023-12-12 – 2023-12-29 (×32): 3 mL via INTRAVENOUS

## 2023-12-12 MED ORDER — APIXABAN 5 MG PO TABS
5.0000 mg | ORAL_TABLET | Freq: Two times a day (BID) | ORAL | Status: DC
  Administered 2023-12-12 (×2): 5 mg via ORAL
  Filled 2023-12-12 (×2): qty 1

## 2023-12-12 MED ORDER — FUROSEMIDE 10 MG/ML IJ SOLN: 40.0000 mg | Freq: Two times a day (BID) | INTRAMUSCULAR | Status: DC

## 2023-12-12 MED ORDER — CHLORHEXIDINE GLUCONATE CLOTH 2 % EX PADS
6.0000 | MEDICATED_PAD | Freq: Every day | CUTANEOUS | Status: DC
  Administered 2023-12-12 – 2023-12-29 (×18): 6 via TOPICAL

## 2023-12-12 MED ORDER — PERFLUTREN LIPID MICROSPHERE
1.0000 mL | INTRAVENOUS | Status: AC | PRN
  Administered 2023-12-12: 4 mL via INTRAVENOUS

## 2023-12-12 MED ORDER — ACETAMINOPHEN 650 MG RE SUPP: 650.0000 mg | Freq: Four times a day (QID) | RECTAL | Status: DC | PRN

## 2023-12-12 MED ORDER — PROCHLORPERAZINE EDISYLATE 10 MG/2ML IJ SOLN
5.0000 mg | Freq: Four times a day (QID) | INTRAMUSCULAR | Status: DC | PRN
  Administered 2023-12-12 – 2023-12-17 (×3): 5 mg via INTRAVENOUS
  Filled 2023-12-12 (×3): qty 2

## 2023-12-12 MED ORDER — MILRINONE LACTATE IN DEXTROSE 20-5 MG/100ML-% IV SOLN
0.2500 ug/kg/min | INTRAVENOUS | Status: DC
  Administered 2023-12-12: 0.25 ug/kg/min via INTRAVENOUS
  Filled 2023-12-12: qty 100

## 2023-12-12 MED ORDER — OXYCODONE HCL 5 MG PO TABS
5.0000 mg | ORAL_TABLET | ORAL | Status: DC | PRN
  Administered 2023-12-14 – 2023-12-21 (×6): 5 mg via ORAL
  Filled 2023-12-12 (×6): qty 1

## 2023-12-12 MED ORDER — SODIUM CHLORIDE 0.9% FLUSH: 10.0000 mL | INTRAVENOUS | Status: DC | PRN

## 2023-12-12 MED ORDER — AMIODARONE HCL 200 MG PO TABS
400.0000 mg | ORAL_TABLET | Freq: Two times a day (BID) | ORAL | Status: DC
Start: 2023-12-12 — End: 2023-12-13
  Administered 2023-12-12 – 2023-12-13 (×3): 400 mg via ORAL
  Filled 2023-12-12 (×3): qty 2

## 2023-12-12 MED ORDER — LORAZEPAM 2 MG/ML IJ SOLN
INTRAMUSCULAR | Status: AC
  Filled 2023-12-12: qty 1

## 2023-12-12 MED ORDER — DEXTROSE 5 % IV SOLN
20.0000 mg/h | INTRAVENOUS | Status: DC
  Administered 2023-12-12 – 2023-12-13 (×3): 20 mg/h via INTRAVENOUS
  Filled 2023-12-12 (×4): qty 20

## 2023-12-12 MED ORDER — AMIODARONE IV BOLUS ONLY 150 MG/100ML
INTRAVENOUS | Status: AC
  Filled 2023-12-12: qty 100

## 2023-12-12 MED ORDER — FENTANYL CITRATE PF 50 MCG/ML IJ SOSY
PREFILLED_SYRINGE | INTRAMUSCULAR | Status: AC
  Administered 2023-12-12: 50 ug
  Filled 2023-12-12: qty 1

## 2023-12-12 MED ORDER — SENNA 8.6 MG PO TABS
1.0000 | ORAL_TABLET | Freq: Every day | ORAL | Status: DC | PRN
  Administered 2023-12-17: 8.6 mg via ORAL
  Filled 2023-12-12: qty 1

## 2023-12-12 MED ORDER — SODIUM CHLORIDE 0.9% FLUSH
10.0000 mL | Freq: Two times a day (BID) | INTRAVENOUS | Status: DC
  Administered 2023-12-13 – 2023-12-14 (×3): 10 mL
  Administered 2023-12-14: 20 mL
  Administered 2023-12-15 – 2023-12-28 (×19): 10 mL
  Administered 2023-12-29: 20 mL

## 2023-12-12 MED ORDER — DIGOXIN 125 MCG PO TABS
0.1250 mg | ORAL_TABLET | Freq: Every day | ORAL | Status: DC
  Administered 2023-12-12: 0.125 mg via ORAL
  Filled 2023-12-12: qty 1

## 2023-12-12 MED ORDER — FUROSEMIDE 10 MG/ML IJ SOLN
60.0000 mg | Freq: Two times a day (BID) | INTRAMUSCULAR | Status: DC
  Administered 2023-12-12: 60 mg via INTRAVENOUS
  Filled 2023-12-12: qty 6

## 2023-12-12 MED ORDER — SODIUM CHLORIDE 0.9% FLUSH
10.0000 mL | Freq: Two times a day (BID) | INTRAVENOUS | Status: DC
  Administered 2023-12-13 – 2023-12-14 (×3): 10 mL
  Administered 2023-12-14: 20 mL
  Administered 2023-12-15 – 2023-12-29 (×23): 10 mL

## 2023-12-12 MED ORDER — ACETAMINOPHEN 325 MG PO TABS
650.0000 mg | ORAL_TABLET | Freq: Four times a day (QID) | ORAL | Status: DC | PRN
  Administered 2023-12-14 – 2023-12-19 (×4): 650 mg via ORAL
  Filled 2023-12-12 (×4): qty 2

## 2023-12-12 MED ORDER — TAMSULOSIN HCL 0.4 MG PO CAPS
0.4000 mg | ORAL_CAPSULE | Freq: Every day | ORAL | Status: DC
Start: 2023-12-12 — End: 2023-12-24
  Administered 2023-12-12 – 2023-12-23 (×12): 0.4 mg via ORAL
  Filled 2023-12-12 (×12): qty 1

## 2023-12-12 MED ORDER — DULOXETINE HCL 20 MG PO CPEP
20.0000 mg | ORAL_CAPSULE | Freq: Every day | ORAL | Status: DC
Start: 2023-12-12 — End: 2023-12-24
  Administered 2023-12-12 – 2023-12-23 (×12): 20 mg via ORAL
  Filled 2023-12-12 (×13): qty 1

## 2023-12-12 NOTE — Significant Event (Signed)
 Rapid Response Event Note   Reason for Call :  Tachycardia-220s, hypotension(64/47)  RN placing zoll pads on pt.  Initial Focused Assessment:  Pt lying in bed with eyes open. He is in a wide complex tachycardia with a rate of 220s. SBP-60s.  He is c/o chest/jaw pain. Skin dusky/diaphoretic.   Dr. Hillary arrived at the same time as RR RN.  Pt given 50mcg fentanyl  IV. Pt then given synchronized shock at 200J. This converted the pt to SR/ST-90-100s, BP-102/88. Pt felt better after interventions.   EKG then done, pt given amiod bolus, milrinone  gtt titrated down, and labs drawn. Plan tx to 2H.    Interventions:  50mcg fentanyl  IV 200J synchronized shock given at 2145 EKG-Sinus rhythm with 1st degree A-V block with occasional Premature ventricular complexes and Fusion complexes. Possible Left atrial enlargement. Right bundle branch block. Left posterior fascicular block. Bifascicular block. Possible Inferior infarct , age undetermined 150mg  Amiodarone  IV bolus Milrinone  decreased to 0.125mct CBC/CMP/Mg/Phos/LA/Co-ox Tx to 2H.  Plan of Care:  Tx to 2H02   Event Summary:   MD Notified: Dr. Hillary notified and came to bedside.  Call Upfz:[phone] Arrival Time:2140 End Time:  Tish Graeme Piety, RN

## 2023-12-12 NOTE — Plan of Care (Signed)
  Problem: Education: Goal: Knowledge of General Education information will improve Description: Including pain rating scale, medication(s)/side effects and non-pharmacologic comfort measures Outcome: Progressing   Problem: Health Behavior/Discharge Planning: Goal: Ability to manage health-related needs will improve Outcome: Progressing   Problem: Clinical Measurements: Goal: Ability to maintain clinical measurements within normal limits will improve Outcome: Progressing   Problem: Nutrition: Goal: Adequate nutrition will be maintained Outcome: Progressing   Problem: Elimination: Goal: Will not experience complications related to bowel motility Outcome: Progressing Goal: Will not experience complications related to urinary retention Outcome: Progressing   Problem: Pain Managment: Goal: General experience of comfort will improve and/or be controlled Outcome: Progressing   Problem: Safety: Goal: Ability to remain free from injury will improve Outcome: Progressing   Problem: Skin Integrity: Goal: Risk for impaired skin integrity will decrease Outcome: Progressing   Problem: Education: Goal: Ability to demonstrate management of disease process will improve Outcome: Progressing Goal: Ability to verbalize understanding of medication therapies will improve Outcome: Progressing Goal: Individualized Educational Video(s) Outcome: Progressing   Problem: Activity: Goal: Capacity to carry out activities will improve Outcome: Progressing   Problem: Cardiac: Goal: Ability to achieve and maintain adequate cardiopulmonary perfusion will improve Outcome: Progressing   Problem: Clinical Measurements: Goal: Diagnostic test results will improve Outcome: Not Progressing Goal: Respiratory complications will improve Outcome: Not Progressing Goal: Cardiovascular complication will be avoided Outcome: Not Progressing   Problem: Coping: Goal: Level of anxiety will decrease Outcome:  Not Progressing

## 2023-12-12 NOTE — Progress Notes (Signed)
 Progress Note   Patient: Harold Park FMW:[id number] DOB: [birth year] DOA: 12/11/2023     0 DOS: the patient was seen and examined on 10/Harold/2025   Brief hospital course: Harold Park was admitted to the hospital with the working diagnosis of heart failure exacerbation,   [name] with the past medical history of hypertension, heart failure, atrial fibrillation, OSA and obesity who presented with dyspnea.  Reported worsening dyspnea for several days prior to admission, generalized malaise and increased abdominal girth.  Lower extremity edema and 7 lbs of rapid weight gain  10/02 had direct current cardioversion, 200 J x1, biphasic synchronized energy, with restoration of sinus rhythm.  At home post procedure he continue to be tachycardic and symptomatic, prompting him to come the ED.  On his initial physical examination his blood pressure was 109/74, HR 108, RR 20 and 02 saturation 98 % Lungs with no wheezing or rhonchi, heart with S1 and S2 present and regular with no gallops, rubs or murmurs, abdomen with no distention and no lower extremity edema.   Na 132, K Harold.8 Cl 97 bicarbonate 21, glucose 140 bun 29 cr 1,74  Mg 2.5  BNP 6,368  High sensitive troponin 60 and 56  Wbc 13.1 hgb 17.1 plt 243  Sars covid 19 negative Influenza negative RSV negative   Chest radiograph with cardiomegaly, bilateral hilar vascular congestion with bilateral central interstitial infiltrates, small left pleural effusion.   EKG 110 bpm, left axis deviation, left anterior fascicular block, right bundle branch block, qtc 424, sinus rhythm with 1st degree AV block, PAC with aberrancy, poor RR wave progression, no significant ST segment or T wave changes.   Assessment and Plan: * Acute on chronic systolic CHF (congestive heart failure) (HCC) Echocardiogram with reduced LV systolic function EF 30 to 35%, mild dilatated LV cavity, RV systolic function preserved, moderate mitral valve regurgitation. Normal size LA  and RA  Akinetic mid and distal anterior septum, mid and distal inferior wall, mid inferoseptal segment and apex.   Documented urine output is 480 Continue volume overloaded.  Systolic blood pressure 100 mmHg range.   Continue diuresis with furosemide  60 mg IV bid Limited medical therapy due to hypotension.  Continue digoxin .  Check lactate.   Essential hypertension Continue blood pressure monitoring  Keep MAP 60 or greater.   PAF (paroxysmal atrial fibrillation) (HCC) Telemetry with sinus rhythm, 1st degree AV block with frequent ectopy.  Continue amiodarone  and anticoagulation with apixaban .  Continue telemetry monitoring,   CKD stage 3a, GFR 45-59 ml/min [facility]) Patient with worsening renal function, will check renal panel today.  Continue diuresis with furosemide .   Type 2 diabetes mellitus with cardiac complication (HCC) Glucose has been stable, will continue to hold on insulin    OSA (obstructive sleep apnea) Cpap  Sarcoidosis Follow up as outpatient  Depression Continue with duloxetine ,   Obesity, class 2 Calculated BMI is 35.7         Subjective: Patient continue to have dyspnea and orthopnea, abdominal distended, no much lower extremity edema   Physical Exam: Vitals:   12/11/23 2200 12/12/23 0107 12/12/23 0600 12/12/23 0743  BP: 113/71 100/60 90/66 (!) 89/74  Pulse: (!) 104 (!) 108 71 100  Resp: 16 19 20 20   Temp: 98.5 F (36.9 C) 98.1 F (36.7 C) 97.9 F (36.6 C) 97.7 F (36.5 C)  TempSrc: Oral Oral Oral Oral  SpO2: 97% 98% 98% 98%  Weight:  (!) 136.8 kg    Height:  6' 5 (1.956  m)     Neurology awake and alert ENT with mild pallor with no icterus Cardiovascular with S1 and S2 present, irregular with no gallop or rubs, positive systolic murmur at theapex Respiratory with mild rales at bases with no wheezing or rhonchi  Abdomen with mild distention, soft and non tender No lower extremity edema   Data Reviewed:    Family Communication:  no family at the bedside   Disposition: Status is: Inpatient Remains inpatient appropriate because: IV diuresis   Planned Discharge Destination: Home     Author: Elidia Toribio Furnace, MD 10/Harold/2025 8:21 AM  For on call review [email or web address].

## 2023-12-12 NOTE — Progress Notes (Signed)
 Patient found to have soft BP, 81/57 (66) and recheck 90/66 (75). On call hospitalist, Dr. Alfornia, notified. No new orders given. Dr. Alfornia advised to check with day physician before administering AM dose of IV lasix . Information reported to oncoming RN.

## 2023-12-12 NOTE — Assessment & Plan Note (Signed)
 Patient with worsening renal function, will check renal panel today.  Continue diuresis with furosemide .

## 2023-12-12 NOTE — H&P (Signed)
 History & Physical   Patient ID: Harold Park MRN: [id number]; DOB: October 22, 1965 Admit date: 12/11/2023 Date of Consult: 12/12/2023  Primary Care Provider: Micheal Park ORN, Harold Park Primary Cardiologist: Harold Park, Harold Park  Primary Electrophysiologist:  Harold Park, Harold Park   History of Present Illness:  Harold Park is a 58 y.o. male with a hx of persistent Afib s/p ablation (10/24), Aflutter s/p CTI who is admitted to cardiology service due to worsening CHF. He was seen in EP clinic and started on amio 400 BID for atrial flutter and underwent DCCV on 10/2. After discharge from the procedure, he started feeling worse and reports gain 10 IBS total. He reports dyspnea, orthopnea, and PND, and also has had some nausea  Past Medical History: Persistent Afib s/p ablation (10/24), Aflutter s/p CTI 10/24, and DCCV 12/2023; moderate MR, HFrEF 36% 5/25  Past Surgical History: s/p PVI 10/24, CTI 10/24  Allergies:    Allergies  Allergen Reactions   Bee Venom Anaphylaxis    Social History: Noncontributory  Family History:   Noncontributory  ROS:  All other ROS reviewed and negative. Pertinent positives noted in the HPI.     Physical Exam/Data:   Vitals:   12/12/23 0107 12/12/23 0600 12/12/23 0743 12/12/23 1124  BP: 100/60 90/66 (!) 89/74 115/87  Pulse: (!) 108 71 100 99  Resp: 19 20 20 16   Temp: 98.1 F (36.7 C) 97.9 F (36.6 C) 97.7 F (36.5 C) 97.6 F (36.4 C)  TempSrc: Oral Oral Oral Oral  SpO2: 98% 98% 98% 98%  Weight: (!) 136.8 kg     Height: 6' 5 (1.956 m)       Intake/Output Summary (Last 24 hours) at 12/12/2023 1600 Last data filed at 12/12/2023 1124 Gross per 24 hour  Intake --  Output 1030 ml  Net -1030 ml       12/12/2023    1:07 AM 12/10/2023    8:05 AM 12/02/2023    8:54 AM  Last 3 Weights  Weight (lbs) 301 lb 9.6 oz 296 lb 295 lb  Weight (kg) 136.805 kg 134.265 kg 133.811 kg    Body mass index is 35.76 kg/m.  General: Well nourished, well developed, in no  acute distress HEENT: Atraumatic, no JVD Cardiac: Normal S1, S2; RRR; no murmurs, rubs, or gallops Lungs:CTAB, no wheezing, rhonchi or rales  Abd: Soft, nontender, no hepatomegaly  Ext: No edema, pulses 2+ Skin: Warm and dry, no rashes     Relevant CV Studies: Trop/BNP: 6.3k TTE 5/25: 36%, mildly dilated LV, mod MR, AA 4.0 cm Assessment and Plan:  Harold Park is a 57 y.o. male with a hx of persistent Afib s/p ablation (10/24), Aflutter s/p CTI who is admitted to cardiology service due to worsening CHF  #ADHF c/f low output state #Persistent Afib #Aflutter -Presenting with worsening HF and minimal urine output despite diuresis. Exam cold and wet and borderline BP. Worsening kidney function with Cr up to 1.8 (from 1.4). Lactate up to 2.1. ECG with sinus tachycardia. Repeat TTE here with EF < 10%, severe biatrial dilation, and severely dilated LV.  -Start lasix  20cc/hr -Start milrinone  0.25 -Trend lactate Q6H - Plan for PICC line placement to obtain co-ox and CVP - Cont amio 400 BID, d/c Eliquis  and digoxin  - CHF to follow in AM  For questions or updates, please contact [facility] Please consult [email or web address] for contact info under    CRITICAL CARE Performed by: Harold Park Harold Park   Total critical care time:  45 minutes  Critical care time was exclusive of separately billable procedures and treating other patients.  Critical care was necessary to treat or prevent imminent or life-threatening deterioration.  Critical care was time spent personally by me on the following activities: development of treatment plan with patient and/or surrogate as well as nursing, discussions with consultants, evaluation of patient's response to treatment, examination of patient, obtaining history from patient or surrogate, ordering and performing treatments and interventions, ordering and review of laboratory studies, ordering and review of radiographic studies, pulse oximetry and  re-evaluation of patient's condition.  Signed, Harold Park, Harold Park, Harold Park  [facility]  12/12/2023 4:00 PM

## 2023-12-12 NOTE — Assessment & Plan Note (Signed)
Calculated BMI is 35.7  ?

## 2023-12-12 NOTE — Hospital Course (Signed)
 Mr. Mellin was admitted to the hospital with the working diagnosis of heart failure exacerbation,   58 yo male with the past medical history of hypertension, heart failure, atrial fibrillation, OSA and obesity who presented with dyspnea.  Reported worsening dyspnea for several days prior to admission, generalized malaise and increased abdominal girth.  Lower extremity edema and 7 lbs of rapid weight gain  10/02 had direct current cardioversion, 200 J x1, biphasic synchronized energy, with restoration of sinus rhythm.  At home post procedure he continue to be tachycardic and symptomatic, prompting him to come the ED.  On his initial physical examination his blood pressure was 109/74, HR 108, RR 20 and 02 saturation 98 % Lungs with no wheezing or rhonchi, heart with S1 and S2 present and regular with no gallops, rubs or murmurs, abdomen with no distention and no lower extremity edema.   Na 132, K 4.8 Cl 97 bicarbonate 21, glucose 140 bun 29 cr 1,74  Mg 2.5  BNP 6,368  High sensitive troponin 60 and 56  Wbc 13.1 hgb 17.1 plt 243  Sars covid 19 negative Influenza negative RSV negative   Chest radiograph with cardiomegaly, bilateral hilar vascular congestion with bilateral central interstitial infiltrates, small left pleural effusion.   EKG 110 bpm, left axis deviation, left anterior fascicular block, right bundle branch block, qtc 424, sinus rhythm with 1st degree AV block, PAC with aberrancy, poor RR wave progression, no significant ST segment or T wave changes.

## 2023-12-12 NOTE — Progress Notes (Signed)
 Peripherally Inserted Central Catheter Placement  The IV Nurse has discussed with the patient and/or persons authorized to consent for the patient, the purpose of this procedure and the potential benefits and risks involved with this procedure.  The benefits include less needle sticks, lab draws from the catheter, and the patient may be discharged home with the catheter. Risks include, but not limited to, infection, bleeding, blood clot (thrombus formation), and puncture of an artery; nerve damage and irregular heartbeat and possibility to perform a PICC exchange if needed/ordered by physician.  Alternatives to this procedure were also discussed.  Bard Power PICC patient education guide, fact sheet on infection prevention and patient information card has been provided to patient /or left at bedside.    PICC Placement Documentation  PICC Double Lumen 12/12/23 Right Brachial 44 cm 0 cm (Active)  Indication for Insertion or Continuance of Line Chronic illness with exacerbations (CF, Sickle Cell, etc.) 12/12/23 1855  Exposed Catheter (cm) 0 cm 12/12/23 1855  Site Assessment Clean, Dry, Intact 12/12/23 1855  Lumen #1 Status Flushed;Saline locked;Blood return noted 12/12/23 1855  Lumen #2 Status Flushed;Saline locked;Blood return noted 12/12/23 1855  Dressing Type Transparent;Securing device 12/12/23 1855  Dressing Status Antimicrobial disc/dressing in place;Clean, Dry, Intact 12/12/23 1855  Line Care Connections checked and tightened 12/12/23 1855  Line Adjustment (NICU/IV Team Only) No 12/12/23 1855  Dressing Intervention New dressing;Adhesive placed at insertion site (IV team only);Adhesive placed around edges of dressing (IV team/ICU RN only) 12/12/23 1855  Dressing Change Due 12/19/23 12/12/23 1855       Harold Park 12/12/2023, 6:56 PM

## 2023-12-12 NOTE — Assessment & Plan Note (Signed)
 Cpap

## 2023-12-12 NOTE — Assessment & Plan Note (Signed)
 Continue with duloxetine ,

## 2023-12-12 NOTE — Progress Notes (Signed)
 Patient with worsening renal function and elevated lactate, possible developing low output state. Will consult Cardiology

## 2023-12-12 NOTE — Assessment & Plan Note (Addendum)
 Echocardiogram with reduced LV systolic function EF 30 to 35%, mild dilatated LV cavity, RV systolic function preserved, moderate mitral valve regurgitation. Normal size LA and RA  Akinetic mid and distal anterior septum, mid and distal inferior wall, mid inferoseptal segment and apex.   Documented urine output is 480 Continue volume overloaded.  Systolic blood pressure 100 mmHg range.   Continue diuresis with furosemide  60 mg IV bid Limited medical therapy due to hypotension.  Continue digoxin .  Check lactate.

## 2023-12-12 NOTE — Assessment & Plan Note (Addendum)
 Telemetry with sinus rhythm, 1st degree AV block with frequent ectopy.  Continue amiodarone  and anticoagulation with apixaban .  Continue telemetry monitoring,

## 2023-12-12 NOTE — Assessment & Plan Note (Signed)
 Continue blood pressure monitoring  Keep MAP 60 or greater.

## 2023-12-12 NOTE — H&P (Signed)
 History and Physical    [name]:[id number] DOB: 1965-09-11 DOA: 12/11/2023  PCP: Micheal [name], MD   Patient coming from: Home   Chief Complaint: SOB, weight gain, abdominal distension   HPI: Harold Park is a 58 y.o. male with medical history significant for hypertension, OSA on CPAP, HFrEF, atrial fibrillation status post ablation 1 year ago, and atrial flutter status post DCCV yesterday who now presents with shortness of breath, weight gain, and abdominal distention.  Patient reports that he had been experiencing shortness of breath, general malaise, and sensation of fluid accumulation in his abdomen.  He had DCCV performed yesterday for atrial flutter, tolerated the procedure well and went home and sinus tachycardia with rates in the low 100s, but has had worsening shortness of breath, sensation of fluid accumulation in his abdomen, and 8 pound weight gain in 1 day.  [facility] ED Course: Upon arrival to the ED, patient is found to be afebrile and saturating 90% on room air with mild tachycardia and stable blood pressure.  Labs are most notable for sodium 132, creatinine 1.74, WBC 13,100, hemoglobin 17.1, troponin 60, and proBNP 6368.  Chest x-ray reveals stable cardiomegaly and mild central pulmonary vascular congestion.  Patient was treated with 40 mg IV Lasix  and transported to [facility] for admission.  Review of Systems:  All other systems reviewed and apart from HPI, are negative.  Past Medical History:  Diagnosis Date   Chronic bronchitis    HTN (hypertension)    NICM (nonischemic cardiomyopathy) (HCC)    Obesity (BMI 30-39.9) 12/04/2015   OSA (obstructive sleep apnea) 08/19/2015   Severe with AHI 64/hr now on CPAP at 13cm H2O   PAF (paroxysmal atrial fibrillation) (HCC)    Systolic CHF, chronic (HCC)     Past Surgical History:  Procedure Laterality Date   APPENDECTOMY     ATRIAL FIBRILLATION ABLATION N/A 12/17/2022   Procedure:  ATRIAL FIBRILLATION ABLATION;  Surgeon: Nancey [name], MD;  Location: [facility] INVASIVE CV LAB;  Service: Cardiovascular;  Laterality: N/A;   BRONCHIAL WASHINGS  04/24/2020   Procedure: BRONCHIAL WASHINGS;  Surgeon: [name], MD;  Location: [facility] ENDOSCOPY;  Service: Pulmonary;;   CARDIAC CATHETERIZATION     CARDIOVERSION N/A 06/19/2017   Procedure: CARDIOVERSION;  Surgeon: [name], MD;  Location: [facility] ENDOSCOPY;  Service: Cardiovascular;  Laterality: N/A;   CARDIOVERSION N/A 10/02/2022   Procedure: CARDIOVERSION;  Surgeon: [name], MD;  Location: [facility] INVASIVE CV LAB;  Service: Cardiovascular;  Laterality: N/A;   CARDIOVERSION N/A 10/20/2022   Procedure: CARDIOVERSION;  Surgeon: [name], MD;  Location: [facility] INVASIVE CV LAB;  Service: Cardiovascular;  Laterality: N/A;   CARDIOVERSION N/A 10/23/2022   Procedure: CARDIOVERSION;  Surgeon: [name], MD;  Location: [facility] INVASIVE CV LAB;  Service: Cardiovascular;  Laterality: N/A;   CARDIOVERSION N/A 11/13/2022   Procedure: CARDIOVERSION;  Surgeon: [name], MD;  Location: [facility] INVASIVE CV LAB;  Service: Cardiovascular;  Laterality: N/A;   CARDIOVERSION N/A 12/10/2023   Procedure: CARDIOVERSION;  Surgeon: Michele Richardson, DO;  Location: [facility] INVASIVE CV LAB;  Service: Cardiovascular;  Laterality: N/A;   FINE NEEDLE ASPIRATION  04/24/2020   Procedure: FINE NEEDLE ASPIRATION (FNA) LINEAR;  Surgeon: [name], MD;  Location: [facility] ENDOSCOPY;  Service: Pulmonary;;   LIPOMA RESECTION     arms   RIGHT HEART CATH N/A 10/30/2022   Procedure: RIGHT HEART CATH;  Surgeon: Gardenia Led, DO;  Location: [facility] INVASIVE  CV LAB;  Service: Cardiovascular;  Laterality: N/A;   SPLENECTOMY     SPLIT NIGHT STUDY  08/12/2015   VIDEO BRONCHOSCOPY WITH ENDOBRONCHIAL ULTRASOUND N/A 04/24/2020   Procedure: VIDEO BRONCHOSCOPY WITH ENDOBRONCHIAL ULTRASOUND;  Surgeon: [name], MD;  Location: [facility] ENDOSCOPY;  Service: Pulmonary;   Laterality: N/A;    Social History:   reports that he has never smoked. He has never used smokeless tobacco. He reports current alcohol use. He reports that he does not use drugs.  Allergies  Allergen Reactions   Bee Venom Anaphylaxis    Family History  Problem Relation Age of Onset   Breast cancer Mother    Cancer Father        blood cancer   Hypertension Other    Heart disease Other    Stomach cancer Neg Hx    Colon cancer Neg Hx    Esophageal cancer Neg Hx    Pancreatic cancer Neg Hx    Rectal cancer Neg Hx      Prior to Admission medications   Medication Sig Start Date End Date Taking? Authorizing Provider  albuterol  (VENTOLIN  HFA) 108 (90 Base) MCG/ACT inhaler TAKE 2 PUFFS BY MOUTH EVERY 6 HOURS AS NEEDED FOR WHEEZE OR SHORTNESS OF BREATH 02/24/22   [name], MD  amiodarone  (PACERONE ) 200 MG tablet Take 2 tablets (400 mg total) by mouth 2 (two) times daily. 12/02/23   [name], [name], MD  amiodarone  (PACERONE ) 200 MG tablet Take 1 tablet (200 mg total) by mouth daily. 12/02/23   [name], [name], MD  apixaban  (ELIQUIS ) 5 MG TABS tablet TAKE 1 TABLET BY MOUTH TWICE A DAY 07/23/23   Bensimhon, [name], MD  digoxin  (LANOXIN ) 0.125 MG tablet Take 1 tablet (0.125 mg total) by mouth daily. 06/29/23   Bensimhon, [name], MD  DULoxetine  (CYMBALTA ) 20 MG capsule TAKE 1 CAPSULE BY MOUTH EVERY DAY 12/02/22   Bensimhon, [name], MD  empagliflozin  (JARDIANCE ) 10 MG TABS tablet Take 1 tablet (10 mg total) by mouth daily. 07/07/23   [name], NP  losartan  (COZAAR ) 25 MG tablet Take 1 tablet (25 mg total) by mouth daily. 08/06/23   Bensimhon, [name], MD  Magnesium 250 MG TABS Take 250 mg by mouth every evening. Gummy    [provider]  potassium chloride  SA (KLOR-CON  M) 20 MEQ tablet Take 2 tablets (40 mEq total) by mouth 2 (two) times daily. 12/03/22   [name], [name], MD  spironolactone  (ALDACTONE ) 25 MG tablet Take 1 tablet (25 mg total) by mouth daily. 11/27/23    Bensimhon, [name], MD  tamsulosin  (FLOMAX ) 0.4 MG CAPS capsule Take 1 capsule (0.4 mg total) by mouth daily after supper. 08/06/23   Bensimhon, [name], MD  tirzepatide  (ZEPBOUND ) 12.5 MG/0.5ML Pen Inject 12.5 mg into the skin once a week. 10/28/23   [name], MD  torsemide  (DEMADEX ) 20 MG tablet Take 2 tablets (40 mg total) by mouth 2 (two) times daily. 08/05/23   Micheal [name], MD    Physical Exam: Vitals:   12/11/23 1900 12/11/23 2126 12/11/23 2200 12/12/23 0107  BP: 109/74  113/71 100/60  Pulse: (!) 106  (!) 104 (!) 108  Resp: 20  16 19   Temp:  98.5 F (36.9 C) 98.5 F (36.9 C) 98.1 F (36.7 C)  TempSrc:  Oral Oral Oral  SpO2: 98%  97% 98%  Weight:    (!) 136.8 kg  Height:    6' 5 (  1.956 m)    Constitutional: NAD, no pallor or diaphoresis  Eyes: PERTLA, lids and conjunctivae normal ENMT: Mucous membranes are moist. Posterior pharynx clear of any exudate or lesions.   Neck: supple, no masses  Respiratory: no wheezing, no crackles. No accessory muscle use.  Cardiovascular: Rate ~100 and regular. No extremity edema.   Abdomen: No tenderness, soft. Bowel sounds active.  Musculoskeletal: no clubbing / cyanosis. No joint deformity upper and lower extremities.   Skin: no significant rashes, lesions, ulcers. Warm, dry, well-perfused. Neurologic: CN 2-12 grossly intact. Moving all extremities. Alert and oriented.  Psychiatric: Calm. Cooperative.    Labs and Imaging on Admission: I have personally reviewed following labs and imaging studies  CBC: Recent Labs  Lab 12/11/23 1807  WBC 13.1*  HGB 17.1*  HCT 52.0  MCV 89.3  PLT 243   Basic Metabolic Panel: Recent Labs  Lab 12/11/23 1807  NA 132*  K 4.8  CL 97*  CO2 21*  GLUCOSE 140*  BUN 29*  CREATININE 1.74*  CALCIUM  9.6  MG 2.5*   GFR: Estimated Creatinine Clearance: 70 mL/min (A) (by C-G formula based on SCr of 1.74 mg/dL (H)). Liver Function Tests: No results for input(s): AST, ALT,  ALKPHOS, BILITOT, PROT, ALBUMIN  in the last 168 hours. No results for input(s): LIPASE, AMYLASE in the last 168 hours. No results for input(s): AMMONIA in the last 168 hours. Coagulation Profile: No results for input(s): INR, PROTIME in the last 168 hours. Cardiac Enzymes: No results for input(s): CKTOTAL, CKMB, CKMBINDEX, TROPONINI in the last 168 hours. BNP (last 3 results) Recent Labs    12/11/23 1807  PROBNP 6,368.0*   HbA1C: No results for input(s): HGBA1C in the last 72 hours. CBG: No results for input(s): GLUCAP in the last 168 hours. Lipid Profile: No results for input(s): CHOL, HDL, LDLCALC, TRIG, CHOLHDL, LDLDIRECT in the last 72 hours. Thyroid  Function Tests: No results for input(s): TSH, T4TOTAL, FREET4, T3FREE, THYROIDAB in the last 72 hours. Anemia Panel: No results for input(s): VITAMINB12, FOLATE, FERRITIN, TIBC, IRON, RETICCTPCT in the last 72 hours. Urine analysis: No results found for: COLORURINE, APPEARANCEUR, LABSPEC, PHURINE, GLUCOSEU, HGBUR, BILIRUBINUR, KETONESUR, PROTEINUR, UROBILINOGEN, NITRITE, LEUKOCYTESUR Sepsis Labs: @LABRCNTIP (procalcitonin:4,lacticidven:4) ) Recent Results (from the past 240 hours)  Resp panel by RT-PCR (RSV, Flu A&B, Covid) Anterior Nasal Swab     Status: None   Collection Time: 12/11/23  7:05 PM   Specimen: Anterior Nasal Swab  Result Value Ref Range Status   SARS Coronavirus 2 by RT PCR NEGATIVE NEGATIVE Final    Comment: (NOTE) SARS-CoV-2 target nucleic acids are NOT DETECTED.  The SARS-CoV-2 RNA is generally detectable in upper respiratory specimens during the acute phase of infection. The lowest concentration of SARS-CoV-2 viral copies this assay can detect is 138 copies/mL. A negative result does not preclude SARS-Cov-2 infection and should not be used as the sole basis for treatment or other patient management decisions. A negative  result may occur with  improper specimen collection/handling, submission of specimen other than nasopharyngeal swab, presence of viral mutation(s) within the areas targeted by this assay, and inadequate number of viral copies(<138 copies/mL). A negative result must be combined with clinical observations, patient history, and epidemiological information. The expected result is Negative.  Fact Sheet for Patients:  BloggerCourse.com  Fact Sheet for Healthcare Providers:  SeriousBroker.it  This test is no t yet approved or cleared by the [place]  FDA and  has been authorized for detection and/or diagnosis of SARS-CoV-2 by  FDA under an Emergency Use Authorization (EUA). This EUA will remain  in effect (meaning this test can be used) for the duration of the COVID-19 declaration under Section 564(b)(1) of the Act, 21 U.S.C.section 360bbb-3(b)(1), unless the authorization is terminated  or revoked sooner.       Influenza A by PCR NEGATIVE NEGATIVE Final   Influenza B by PCR NEGATIVE NEGATIVE Final    Comment: (NOTE) The Xpert Xpress SARS-CoV-2/FLU/RSV plus assay is intended as an aid in the diagnosis of influenza from Nasopharyngeal swab specimens and should not be used as a sole basis for treatment. Nasal washings and aspirates are unacceptable for Xpert Xpress SARS-CoV-2/FLU/RSV testing.  Fact Sheet for Patients: BloggerCourse.com  Fact Sheet for Healthcare Providers: SeriousBroker.it  This test is not yet approved or cleared by the [place]  FDA and has been authorized for detection and/or diagnosis of SARS-CoV-2 by FDA under an Emergency Use Authorization (EUA). This EUA will remain in effect (meaning this test can be used) for the duration of the COVID-19 declaration under Section 564(b)(1) of the Act, 21 U.S.C. section 360bbb-3(b)(1), unless the authorization is  terminated or revoked.     Resp Syncytial Virus by PCR NEGATIVE NEGATIVE Final    Comment: (NOTE) Fact Sheet for Patients: BloggerCourse.com  Fact Sheet for Healthcare Providers: SeriousBroker.it  This test is not yet approved or cleared by the [place]  FDA and has been authorized for detection and/or diagnosis of SARS-CoV-2 by FDA under an Emergency Use Authorization (EUA). This EUA will remain in effect (meaning this test can be used) for the duration of the COVID-19 declaration under Section 564(b)(1) of the Act, 21 U.S.C. section 360bbb-3(b)(1), unless the authorization is terminated or revoked.  Performed at [facility], [address], [place], [place] [address]      Radiological Exams on Admission: DG Chest 2 View Result Date: 12/11/2023 CLINICAL DATA:  Chest pain. EXAM: CHEST - 2 VIEW COMPARISON:  November 03, 2022. FINDINGS: Stable cardiomegaly with mild central pulmonary vascular congestion. No consolidative process. Bony thorax is unremarkable. IMPRESSION: Stable cardiomegaly with mild central pulmonary vascular congestion. Electronically Signed   By: Lynwood Landy Raddle M.D.   On: 12/11/2023 18:11   EP STUDY Result Date: 12/10/2023 See surgical note for result.   EKG: Independently reviewed. Sinus tachycardia, rate 110, 1st degree AV block, RBBB.   Assessment/Plan   1. Acute on chronic HFrEF  - EF was 30-35% on echo from May 2025  - Continue diuresis with IV Lasix , monitor weight and I/Os, monitor renal function and electrolytes    2. Atrial fibrillation, atrial flutter  - S/p a fib ablation in October 2024 and then DCCV 12/10/23 for atrial flutter  - Currently in sinus tachycardia with rate low 100s  - Scheduled for repeat ablation next month  - Continue Eliquis , amiodarone     3. AKI  - SCr is 1.74, up from 1.41 on September 2025, baseline appears to be closer to 1.2  - Hold losartan ,  renally-dose medications, monitor closely while diuresing    4. Depression  - Cymbalta     5. OSA  - CPAP while sleeping    6. Polycythemia  - Secondary, followed by hematology, appears stable     DVT prophylaxis: Eliquis   Code Status: Full  Level of Care: Level of care: Progressive Family Communication: None present  Disposition Plan:  Patient is from: home  Anticipated d/c is to: Home  Anticipated d/c date is: 12/14/23  Patient currently: Pending improved volume  status, stable renal function  Consults called: None  Admission status: Inpatient     Evalene GORMAN Sprinkles, MD [facility]  12/12/2023, 2:20 AM

## 2023-12-12 NOTE — Plan of Care (Addendum)
 Brief Cardiology Note  Called to bedside for wide complex tachycardia with HR of 230 bpm and associated hypotension with SBP in the 60's-70's. Patient awake but in acute distress, moaning, complaining of chest pain. 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  Milrinone  decreased to 0.125 mcg/kg/min. Labs drawn and pending.  Transfer order placed for 2-Heart.

## 2023-12-12 NOTE — Assessment & Plan Note (Signed)
 Glucose has been stable, will continue to hold on insulin 

## 2023-12-12 NOTE — Assessment & Plan Note (Signed)
 Follow up as outpatient

## 2023-12-13 DIAGNOSIS — I4819 Other persistent atrial fibrillation: Secondary | ICD-10-CM

## 2023-12-13 DIAGNOSIS — I472 Ventricular tachycardia, unspecified: Secondary | ICD-10-CM | POA: Diagnosis not present

## 2023-12-13 DIAGNOSIS — I5023 Acute on chronic systolic (congestive) heart failure: Secondary | ICD-10-CM | POA: Diagnosis not present

## 2023-12-13 LAB — BASIC METABOLIC PANEL WITH GFR
Anion gap: 12 (ref 5–15)
Anion gap: 10 (ref 5–15)
BUN: 28 mg/dL — ABNORMAL HIGH (ref 6–20)
BUN: 30 mg/dL — ABNORMAL HIGH (ref 6–20)
CO2: 22 mmol/L (ref 22–32)
CO2: 24 mmol/L (ref 22–32)
Calcium: 8.4 mg/dL — ABNORMAL LOW (ref 8.9–10.3)
Calcium: 8.2 mg/dL — ABNORMAL LOW (ref 8.9–10.3)
Chloride: 99 mmol/L (ref 98–111)
Chloride: 98 mmol/L (ref 98–111)
Creatinine, Ser: 1.6 mg/dL — ABNORMAL HIGH (ref 0.61–1.24)
Creatinine, Ser: 1.72 mg/dL — ABNORMAL HIGH (ref 0.61–1.24)
GFR, Estimated: 49 mL/min — ABNORMAL LOW (ref >60)
GFR, Estimated: 45 mL/min — ABNORMAL LOW (ref >60)
Glucose, Bld: 122 mg/dL — ABNORMAL HIGH (ref 70–99)
Glucose, Bld: 149 mg/dL — ABNORMAL HIGH (ref 70–99)
Potassium: 3.7 mmol/L (ref 3.5–5.1)
Potassium: 3.7 mmol/L (ref 3.5–5.1)
Sodium: 133 mmol/L — ABNORMAL LOW (ref 135–145)
Sodium: 132 mmol/L — ABNORMAL LOW (ref 135–145)

## 2023-12-13 LAB — APTT: aPTT: 64 s — ABNORMAL HIGH (ref 24–36)

## 2023-12-13 LAB — MRSA NEXT GEN BY PCR, NASAL: MRSA by PCR Next Gen: NOT DETECTED

## 2023-12-13 LAB — COOXEMETRY PANEL
Carboxyhemoglobin: 1.6 % — ABNORMAL HIGH (ref 0.5–1.5)
Methemoglobin: <0.7 % (ref 0.0–1.5)
O2 Saturation: 68.6 %
Total hemoglobin: 16.3 g/dL — ABNORMAL HIGH (ref 12.0–16.0)

## 2023-12-13 LAB — MAGNESIUM: Magnesium: 2.4 mg/dL (ref 1.7–2.4)

## 2023-12-13 MED ORDER — HEPARIN (PORCINE) 25000 UT/250ML-% IV SOLN
1450.0000 [IU]/h | INTRAVENOUS | Status: DC
  Administered 2023-12-13: 1600 [IU]/h via INTRAVENOUS
  Administered 2023-12-14: 1450 [IU]/h via INTRAVENOUS
  Filled 2023-12-13 (×3): qty 250

## 2023-12-13 MED ORDER — POTASSIUM CHLORIDE CRYS ER 10 MEQ PO TBCR
30.0000 meq | EXTENDED_RELEASE_TABLET | ORAL | Status: AC
  Administered 2023-12-13 (×2): 30 meq via ORAL
  Filled 2023-12-13 (×2): qty 1

## 2023-12-13 MED ORDER — TORSEMIDE 20 MG PO TABS
40.0000 mg | ORAL_TABLET | Freq: Two times a day (BID) | ORAL | Status: DC
Start: 2023-12-14 — End: 2023-12-13

## 2023-12-13 MED ORDER — FUROSEMIDE 10 MG/ML IJ SOLN
20.0000 mg/h | INTRAVENOUS | Status: DC
  Administered 2023-12-13 – 2023-12-14 (×5): 20 mg/h via INTRAVENOUS
  Filled 2023-12-13 (×5): qty 20

## 2023-12-13 MED ORDER — AMIODARONE HCL IN DEXTROSE 360-4.14 MG/200ML-% IV SOLN
60.0000 mg/h | INTRAVENOUS | Status: AC
  Administered 2023-12-13 (×2): 60 mg/h via INTRAVENOUS
  Filled 2023-12-13: qty 200

## 2023-12-13 MED ORDER — METOLAZONE 2.5 MG PO TABS
2.5000 mg | ORAL_TABLET | Freq: Once | ORAL | Status: AC
  Administered 2023-12-13: 2.5 mg via ORAL
  Filled 2023-12-13: qty 1

## 2023-12-13 MED ORDER — SPIRONOLACTONE 25 MG PO TABS
25.0000 mg | ORAL_TABLET | Freq: Every day | ORAL | Status: DC
  Administered 2023-12-14 – 2023-12-16 (×3): 25 mg via ORAL
  Filled 2023-12-13 (×3): qty 1

## 2023-12-13 MED ORDER — DIGOXIN 125 MCG PO TABS
0.1250 mg | ORAL_TABLET | Freq: Every day | ORAL | Status: DC
  Administered 2023-12-13: 0.125 mg via ORAL
  Filled 2023-12-13: qty 1

## 2023-12-13 MED ORDER — AMIODARONE HCL IN DEXTROSE 360-4.14 MG/200ML-% IV SOLN
30.0000 mg/h | INTRAVENOUS | Status: DC
Start: 2023-12-13 — End: 2023-12-30
  Administered 2023-12-13 – 2023-12-14 (×2): 30 mg/h via INTRAVENOUS
  Administered 2023-12-15 – 2023-12-17 (×10): 60 mg/h via INTRAVENOUS
  Administered 2023-12-18: 30 mg/h via INTRAVENOUS
  Administered 2023-12-18: 60 mg/h via INTRAVENOUS
  Administered 2023-12-19 – 2023-12-29 (×20): 30 mg/h via INTRAVENOUS
  Filled 2023-12-13 (×2): qty 200
  Filled 2023-12-13: qty 400
  Filled 2023-12-13 (×22): qty 200
  Filled 2023-12-13: qty 400
  Filled 2023-12-13 (×5): qty 200
  Filled 2023-12-13: qty 400
  Filled 2023-12-13 (×4): qty 200

## 2023-12-13 MED ORDER — POTASSIUM CHLORIDE CRYS ER 10 MEQ PO TBCR
30.0000 meq | EXTENDED_RELEASE_TABLET | ORAL | Status: AC
  Administered 2023-12-13 (×2): 30 meq via ORAL
  Filled 2023-12-13 (×3): qty 1

## 2023-12-13 MED ORDER — AMIODARONE LOAD VIA INFUSION
150.0000 mg | Freq: Once | INTRAVENOUS | Status: AC
Start: 2023-12-13 — End: 2023-12-13
  Administered 2023-12-13: 150 mg via INTRAVENOUS
  Filled 2023-12-13: qty 83.34

## 2023-12-13 NOTE — Consult Note (Addendum)
 Advanced Heart Failure Team Consult Note   Primary Physician: Harold Park ORN, Park Cardiologist:  Harold Bihari, Park  Reason for Consultation: Cardiogenic shock  HPI:    Harold Park is seen today for evaluation of cardiogenic shock at the request of Dr. Obed.   Echo 2011: EF 30-35%. Cath 2011 with normal cors. Echo 10/17: EF 35-40%.   Underwent DC-CV on 06/19/17 for recurrent AF Seen in AF Clinic on 07/02/17. Felt not to be ideal candidate for AF ablation   Echo 12/21 EF 40-45%   CT angio in 2019 aortic root was 4.6 cm CT chest 03/06/20 to evaluate hilar fullness seen on CXR. Interval development of numerous enlarged mediastinal and bilateral hilar lymph nodes. Underwent bronchoscopy with biopsy of mediastinal nodes. 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 Treated with prednisone .  CT coronary 03/06/20. Calcium  score 0. Poor arterial imaging but no obvious CAD - CMRI 2017 LVEF 20%. No LGE - Cardiac PET 2023- no evidence for cardiac sarcoid or inflammatory. LVEF 38%.   Follow up 7/24, back in AFL. Diuretics increased, sp DCCV to NSR 7/225/24. Follow up in AF clinic, back in AFL with RVR. Ranexa  stopped in anticipation of Tikosyn loading.    Admitted 8/24 with AF with RVR. He was sedated and underwent cardioversion in the ED. Subsequently developed shock. Bedside POCUS with EF 15%. Echo with EF 25-30%, RV moderately down. Had recurrent Afib with RVR and PVCs. DCCV attempted X 2 on 10/19/22. Amiodarone  load resumed and had repeat DCCV on 10/23/22 with conversion to SR.    Readmitted 10/27/22 with recurrent AFL with RVR and acute on chronic CHF with low-output. Converted to SR with IV amiodarone . He required inotrope support with milrinone . RHC on milrinone  with elevated filling pressures and low-output Fick CI 2.1 and TD CI 1.7. He was diuresed with IV lasix  and metolazone . Course further complicated by AKI on CKD and PNA, for which he was treated with antibiotics.    Recurrent atrial  flutter, and underwent DCCV to SR on 11/13/22.    S/p AFL ablation 10/24.   Echo 12/24 EF 25-30% RV ok. Echo 525 EF 30-35%  cMRI 2/25 EF 28 % RV 33% LGE pattern concerning for sarcoid   Had been doing relatively well in NSR. Saw Dr. Nancey 12/03/23 and noted to be back in atypical AFL. Amio increased. Scheduled for DC-CV and repeat AFL ablation   Underwent outpatient DC-CV on 10/2. Admitted 10/4 with recurrent HF and 10 pound weight gain. Diuresed.  Developed VT overnight and shocked.   Echo 12/12/23 EF < 15 severe RV HK  Feels ok now. Denies CP or SOB, remaining in NSR     Home Medications Prior to Admission medications   Medication Sig Start Date End Date Taking? Authorizing Provider  albuterol  (VENTOLIN  HFA) 108 (90 Base) MCG/ACT inhaler TAKE 2 PUFFS BY MOUTH EVERY 6 HOURS AS NEEDED FOR WHEEZE OR SHORTNESS OF BREATH 02/24/22  Yes Harold Park  amiodarone  (PACERONE ) 200 MG tablet Take 2 tablets (400 mg total) by mouth 2 (two) times daily. 12/02/23  Yes Harold Park, Harold Park  apixaban  (ELIQUIS ) 5 MG TABS tablet TAKE 1 TABLET BY MOUTH TWICE A DAY 07/23/23  Yes Harold Park  digoxin  (LANOXIN ) 0.125 MG tablet Take 1 tablet (0.125 mg total) by mouth daily. 06/29/23  Yes Harold Park  DULoxetine  (CYMBALTA ) 20 MG capsule TAKE 1 CAPSULE BY MOUTH EVERY DAY 12/02/22  Yes Harold Park  empagliflozin  (JARDIANCE )  10 MG TABS tablet Take 1 tablet (10 mg total) by mouth daily. 07/07/23  Yes Harold Park  Magnesium 250 MG TABS Take 250 mg by mouth every evening. Gummy   Yes Provider, Historical, Park  potassium chloride  SA (KLOR-CON  M) 20 MEQ tablet Take 2 tablets (40 mEq total) by mouth 2 (two) times daily. 12/03/22  Yes Harold Park, Harold Park  spironolactone  (ALDACTONE ) 25 MG tablet Take 1 tablet (25 mg total) by mouth daily. 11/27/23  Yes Harold Park, Harold Park  tamsulosin  (FLOMAX ) 0.4 MG CAPS capsule Take 1 capsule (0.4 mg total) by mouth daily after supper.  08/06/23  Yes Harold Park, Harold Park  tirzepatide  (ZEPBOUND ) 12.5 MG/0.5ML Pen Inject 12.5 mg into the skin once a week. 10/28/23  Yes Harold Ezra RAMAN, Park  torsemide  (DEMADEX ) 20 MG tablet Take 2 tablets (40 mg total) by mouth 2 (two) times daily. 08/05/23  Yes Harold Park  losartan  (COZAAR ) 25 MG tablet Take 1 tablet (25 mg total) by mouth daily. Patient not taking: Reported on 12/12/2023 08/06/23   Echo Harold Park, Harold Park    Past Medical History: Past Medical History:  Diagnosis Date   Chronic bronchitis    HTN (hypertension)    NICM (nonischemic cardiomyopathy) (HCC)    Obesity (BMI 30-39.9) 12/04/2015   OSA (obstructive sleep apnea) 08/19/2015   Severe with AHI 64/hr now on CPAP at 13cm H2O   PAF (paroxysmal atrial fibrillation) (HCC)    Systolic CHF, chronic (HCC)     Past Surgical History: Past Surgical History:  Procedure Laterality Date   APPENDECTOMY     ATRIAL FIBRILLATION ABLATION N/A 12/17/2022   Procedure: ATRIAL FIBRILLATION ABLATION;  Surgeon: Harold Park;  Location: [facility];  Service: Cardiovascular;  Laterality: N/A;   BRONCHIAL WASHINGS  04/24/2020   Procedure: BRONCHIAL WASHINGS;  Surgeon: Harold Park;  Location: [facility] ENDOSCOPY;  Service: Pulmonary;;   CARDIAC CATHETERIZATION     CARDIOVERSION N/A 06/19/2017   Procedure: CARDIOVERSION;  Surgeon: Harold Park;  Location: [facility] ENDOSCOPY;  Service: Cardiovascular;  Laterality: N/A;   CARDIOVERSION N/A 10/02/2022   Procedure: CARDIOVERSION;  Surgeon: Harold Park;  Location: [facility];  Service: Cardiovascular;  Laterality: N/A;   CARDIOVERSION N/A 10/20/2022   Procedure: CARDIOVERSION;  Surgeon: Harold Park;  Location: [facility];  Service: Cardiovascular;  Laterality: N/A;   CARDIOVERSION N/A 10/23/2022   Procedure: CARDIOVERSION;  Surgeon: Harold Park;  Location: [facility];  Service: Cardiovascular;  Laterality:  N/A;   CARDIOVERSION N/A 11/13/2022   Procedure: CARDIOVERSION;  Surgeon: Harold Park;  Location: [facility];  Service: Cardiovascular;  Laterality: N/A;   CARDIOVERSION N/A 12/10/2023   Procedure: CARDIOVERSION;  Surgeon: Michele Richardson, DO;  Location: [facility];  Service: Cardiovascular;  Laterality: N/A;   FINE NEEDLE ASPIRATION  04/24/2020   Procedure: FINE NEEDLE ASPIRATION (FNA) LINEAR;  Surgeon: Harold Park;  Location: [facility] ENDOSCOPY;  Service: Pulmonary;;   LIPOMA RESECTION     arms   RIGHT HEART CATH N/A 10/30/2022   Procedure: RIGHT HEART CATH;  Surgeon: Gardenia Led, DO;  Location: [facility];  Service: Cardiovascular;  Laterality: N/A;   SPLENECTOMY     SPLIT NIGHT STUDY  08/12/2015   VIDEO BRONCHOSCOPY WITH ENDOBRONCHIAL ULTRASOUND N/A 04/24/2020   Procedure: VIDEO BRONCHOSCOPY WITH ENDOBRONCHIAL ULTRASOUND;  Surgeon: Harold Park;  Location: [facility] ENDOSCOPY;  Service: Pulmonary;  Laterality: N/A;    Family History: Family History  Problem Relation Age of Onset   Breast cancer Mother    Cancer Father        blood cancer   Hypertension Other    Heart disease Other    Stomach cancer Neg Hx    Colon cancer Neg Hx    Esophageal cancer Neg Hx    Pancreatic cancer Neg Hx    Rectal cancer Neg Hx     Social History: Social History   Socioeconomic History   Marital status: Married    Spouse name: Not on file   Number of children: 3   Years of education: Not on file   Highest education level: Not on file  Occupational History   Occupation: realtor  Tobacco Use   Smoking status: Never   Smokeless tobacco: Never  Vaping Use   Vaping status: Never Used  Substance and Sexual Activity   Alcohol use: Yes    Comment: occasional   Drug use: Never   Sexual activity: Not on file  Other Topics Concern   Not on file  Social History Narrative   Not on file   Social Drivers of Health   Financial Resource Strain: Not on file   Food Insecurity: No Food Insecurity (12/12/2023)   Hunger Vital Sign    Worried About Running Out of Food in the Last Year: Never true    Ran Out of Food in the Last Year: Never true  Transportation Needs: No Transportation Needs (12/12/2023)   PRAPARE - Administrator, Civil Service (Medical): No    Lack of Transportation (Non-Medical): No  Physical Activity: Not on file  Stress: Not on file  Social Connections: Not on file    Allergies:  Allergies  Allergen Reactions   Bee Venom Anaphylaxis    Objective:    Vital Signs:   Temp:  [97.3 F (36.3 C)-98.3 F (36.8 C)] 97.3 F (36.3 C) (10/05 0700) Pulse Rate:  [50-129] 53 (10/05 0900) Resp:  [9-32] 22 (10/05 0900) BP: (65-145)/(47-128) 104/89 (10/05 0900) SpO2:  [92 %-98 %] 93 % (10/05 0900) Weight:  [132.1 kg-137.4 kg] 132.1 kg (10/05 0400) Last BM Date : 12/10/23  Weight change: Filed Weights   12/12/23 0107 12/12/23 2324 12/13/23 0400  Weight: (!) 136.8 kg (!) 137.4 kg 132.1 kg    Intake/Output:   Intake/Output Summary (Last 24 hours) at 12/13/2023 0931 Last data filed at 12/13/2023 0801 Gross per 24 hour  Intake 594.98 ml  Output 2000 ml  Net -1405.02 ml      Physical Exam    General:  Sitting up in bed No resp difficulty HEENT: normal Neck: supple. JVP flay . Carotids 2+ bilat; no bruits. No lymphadenopathy or thyromegaly appreciated. Cor: PMI nondisplaced. Regular rate & rhythm. No rubs, gallops or murmurs. Lungs: clear Abdomen: obese  soft, nontender, nondistended. No hepatosplenomegaly. No bruits or masses. Good bowel sounds. Extremities: no cyanosis, clubbing, rash, edema Neuro: alert & orientedx3, cranial nerves grossly intact. moves all 4 extremities w/o difficulty. Affect pleasant   Telemetry   Sinus with PVCs   Labs   Basic Metabolic Panel: Recent Labs  Lab 12/11/23 1807 12/12/23 0927 12/12/23 1752 12/12/23 2203 12/13/23 0534  NA 132* 132* 131* 133* 133*  K 4.8 4.9 4.4  4.0 3.7  CL 97* 100 101 100 99  CO2 21* 21* 19* 23 22  GLUCOSE 140* 159* 135* 137* 122*  BUN 29* 29* 31* 29* 28*  CREATININE 1.74* 1.81* 1.65* 1.65* 1.60*  CALCIUM  9.6 8.8* 8.3* 8.3* 8.4*  MG 2.5* 2.3  --  2.2 2.4  PHOS  --   --   --  4.4  --     Liver Function Tests: Recent Labs  Lab 12/12/23 1752 12/12/23 2203  AST 49* 45*  ALT 52* 51*  ALKPHOS 56 55  BILITOT 1.4* 1.1  PROT 5.9* 6.2*  ALBUMIN  3.0* 3.0*   No results for input(s): LIPASE, AMYLASE in the last 168 hours. No results for input(s): AMMONIA in the last 168 hours.  CBC: Recent Labs  Lab 12/11/23 1807 12/12/23 1752 12/12/23 2203  WBC 13.1* 12.2* 12.7*  HGB 17.1* 16.0 15.7  HCT 52.0 48.1 47.1  MCV 89.3 87.8 87.4  PLT 243 231 242    Cardiac Enzymes: No results for input(s): CKTOTAL, CKMB, CKMBINDEX, TROPONINI in the last 168 hours.  BNP: BNP (last 3 results) Recent Labs    01/13/23 1126 08/06/23 1522  BNP 70.0 36.5    ProBNP (last 3 results) Recent Labs    12/11/23 1807  PROBNP 6,368.0*     CBG: Recent Labs  Lab 12/12/23 2259  GLUCAP 163*    Coagulation Studies: No results for input(s): LABPROT, INR in the last 72 hours.   Imaging   ECHOCARDIOGRAM COMPLETE Result Date: 12/12/2023    ECHOCARDIOGRAM REPORT   Patient Name:   Harold Park Date of Exam: 12/12/2023 Medical Rec #:  [id number]       Height:       77.0 in Accession #:    [id number]      Weight:       301.6 lb Date of Birth:  Dec 21, 1965       BSA:          2.663 m Patient Age:    58 years        BP:           115/87 mmHg Patient Gender: M               HR:           100 bpm. Exam Location:  Inpatient Procedure: 2D Echo, Cardiac Doppler, Color Doppler and Intracardiac            Opacification Agent (Both Spectral and Color Flow Doppler were            utilized during procedure). Indications:    Dyspnea  History:        Patient has prior history of Echocardiogram examinations, most                 recent  08/07/2023. CHF, Arrythmias:PVC and Tachycardia; Risk                 Factors:Hypertension, Diabetes and Sleep Apnea. CKD.  Sonographer:    Philomena Daring Referring Phys: MAURICIO Jashua Knaak ARRIEN IMPRESSIONS  1. No LV thrombus by Definity . Left ventricular ejection fraction, by estimation, is <10%. The left ventricle has severely decreased function. The left ventricle has no regional wall motion abnormalities. The left ventricular internal cavity size was moderately to severely dilated. There is mild left ventricular hypertrophy. Left ventricular diastolic parameters are indeterminate.  2. Right ventricular systolic function is severely reduced. The right ventricular size is moderately enlarged. There is normal pulmonary artery systolic pressure.  3. Left atrial size was severely dilated.  4. Right atrial size was mild to moderately dilated.  5. The mitral  valve is grossly normal. Mild mitral valve regurgitation. No evidence of mitral stenosis.  6. The aortic valve was not well visualized. Aortic valve regurgitation is not visualized. No aortic stenosis is present.  7. The inferior vena cava is dilated in size with <50% respiratory variability, suggesting right atrial pressure of 15 mmHg.  8. Evidence of atrial level shunting detected by color flow Doppler. Comparison(s): Changes from prior study are noted. Severe biventricular failure. FINDINGS  Left Ventricle: No LV thrombus by Definity . Left ventricular ejection fraction, by estimation, is <10%. The left ventricle has severely decreased function. The left ventricle has no regional wall motion abnormalities. Definity  contrast agent was given IV to delineate the left ventricular endocardial borders. Strain was performed and the global longitudinal strain is indeterminate. The left ventricular internal cavity size was moderately to severely dilated. There is mild left ventricular hypertrophy. Left ventricular diastolic parameters are indeterminate. Right Ventricle: The  right ventricular size is moderately enlarged. No increase in right ventricular wall thickness. Right ventricular systolic function is severely reduced. There is normal pulmonary artery systolic pressure. The tricuspid regurgitant velocity is 1.97 m/s, and with an assumed right atrial pressure of 15 mmHg, the estimated right ventricular systolic pressure is 30.5 mmHg. Left Atrium: Left atrial size was severely dilated. Right Atrium: Right atrial size was mild to moderately dilated. Pericardium: There is no evidence of pericardial effusion. Mitral Valve: The mitral valve is grossly normal. Mild mitral valve regurgitation. No evidence of mitral valve stenosis. Tricuspid Valve: The tricuspid valve is grossly normal. Tricuspid valve regurgitation is mild . No evidence of tricuspid stenosis. Aortic Valve: The aortic valve was not well visualized. Aortic valve regurgitation is not visualized. No aortic stenosis is present. Pulmonic Valve: The pulmonic valve was not well visualized. Pulmonic valve regurgitation is not visualized. No evidence of pulmonic stenosis. Aorta: The aortic root and ascending aorta are structurally normal, with no evidence of dilitation. Venous: The inferior vena cava is dilated in size with less than 50% respiratory variability, suggesting right atrial pressure of 15 mmHg. IAS/Shunts: Evidence of atrial level shunting detected by color flow Doppler. Additional Comments: 3D was performed not requiring image post processing on an independent workstation and was indeterminate.  LEFT VENTRICLE PLAX 2D LVIDd:         6.60 cm   Diastology LVIDs:         6.40 cm   LV Park' medial:    6.31 cm/s LV PW:         1.30 cm   LV Park/Park' medial:  13.7 LV IVS:        1.30 cm   LV Park' lateral:   7.29 cm/s LVOT diam:     2.30 cm   LV Park/Park' lateral: 11.9 LV SV:         16 LV SV Index:   6 LVOT Area:     4.15 cm  RIGHT VENTRICLE            IVC RV S prime:     9.03 cm/s  IVC diam: 3.50 cm TAPSE (M-mode): 1.5 cm LEFT ATRIUM               Index        RIGHT ATRIUM           Index LA diam:        4.90 cm  1.84 cm/m   RA Area:     28.20 cm LA Vol (A2C):   234.0 ml 87.85  ml/m  RA Volume:   85.10 ml  31.95 ml/m LA Vol (A4C):   187.0 ml 70.21 ml/m LA Biplane Vol: 224.0 ml 84.10 ml/m  AORTIC VALVE LVOT Vmax:   32.30 cm/s LVOT Vmean:  21.300 cm/s LVOT VTI:    0.038 m  AORTA Ao Root diam: 3.40 cm Ao Asc diam:  3.80 cm MITRAL VALVE               TRICUSPID VALVE MV Area (PHT): 7.44 cm    TR Peak grad:   15.5 mmHg MV Decel Time: 102 msec    TR Vmax:        197.00 cm/s MV Park velocity: 86.40 cm/s                            SHUNTS                            Systemic VTI:  0.04 m                            Systemic Diam: 2.30 cm Vishnu Priya Mallipeddi Electronically signed by Diannah Late Mallipeddi Signature Date/Time: 12/12/2023/12:43:45 PM    Final    US  EKG SITE RITE Result Date: 12/12/2023 If Site Rite image not attached, placement could not be confirmed due to current cardiac rhythm.    Medications:     Current Medications:  amiodarone   400 mg Oral BID   Chlorhexidine  Gluconate Cloth  6 each Topical Daily   DULoxetine   20 mg Oral Daily   LORazepam       potassium chloride   30 mEq Oral Q4H   sodium chloride  flush  10-40 mL Intracatheter Q12H   sodium chloride  flush  10-40 mL Intracatheter Q12H   sodium chloride  flush  3 mL Intravenous Q12H   tamsulosin   0.4 mg Oral QPC supper    Infusions:  amiodarone      furosemide  (LASIX ) 200 mg in dextrose  5 % 100 mL (2 mg/mL) infusion 20 mg/hr (12/13/23 0815)      Assessment/Plan   1. Acute on chronic systolic HF due to NICM - CT coronary 03/06/20. Calcium  score 0. Poor arterial imaging but no obvious CAD - CMRI 2017 LVEF 20%. No LGE - Cardiac PET 2023- no evidence for cardiac sarcoid or inflammatory. LVEF 38%. - Echo 5/25 EF 30-35% -cMRI 2/25 EF 28 % RV 33% LGE pattern concerning for sarcoid  - Echo 12/12/23 EF < 15 severe RV HK - EF back down in setting of recurrent  AFL - cMRI now showing evidence of potential cardiac sarcoid (previous MRI and PET did not show this) - Will need maintenance of NSR as well as repeat PET scan with eye towards immunosuppression - Volume status improved. Restart GDMT  (Addendum CVP 17-18 -> will continue lasix  gtt) - We again discussed need for possible advanced therapies  - check CVP and co-ox  2. Persistent AF/AFL - S/p AFL ablation 10/24. - underwent DC-CV on 12/10/23 - remains in NSR - start IV amio - Start heparin   - will have EP see for potential AFL ablation (versus AVN/pacer) + ICD this admit  3. Sustained VT - s/p emergent DC-CV on 10/4 - start IV amio (was on 400 daily at home) - I am tempted to start steroids empirically given MRI findings for sarcoid but given potential for ICD and  AFL ablation this admit will hold until we talk to EF (or recurrent VT occurs)  - Keep K > 4.0 Mg > 2,0   4. CKD 3a -  Scr stable at 1,6  5. OSA - continue CPAP  6. 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 Aortic root dilation - following   CRITICAL CARE Performed by: Harold Sieving  Total critical care time: 50 minutes  Critical care time was exclusive of separately billable procedures and treating other patients.  Critical care was necessary to treat or prevent imminent or life-threatening deterioration.  Critical care was time spent personally by me (independent of midlevel providers or residents) on the following activities: development of treatment plan with patient and/or surrogate as well as nursing, discussions with consultants, evaluation of patient's response to treatment, examination of patient, obtaining history from patient or surrogate, ordering and performing treatments and interventions, ordering and review of laboratory studies, ordering and review of radiographic studies, pulse oximetry and re-evaluation of patient's condition.  Length of Stay:  1  Sieving Cherrie, Park  12/13/2023, 9:31 AM    Advanced Heart Failure Team Pager [phone] (M-F; 7a - 5p)  Please contact [facility] for night-coverage after hours (4p -7a ) and weekends on [email or web address]

## 2023-12-13 NOTE — Progress Notes (Signed)
 Called at the bed side by a fellow RN who was checking on the patient and found him tachycardic with a HR of 231, patient was sweaty and c/o CP. S. Spradley at the bed side and called rapid response team and T. Gershon, the Information systems manager. The MD at the bed side. After a shock patient's HR came back to SR/ST in the 90s. Ordered labs drawn and send down the lab service. New order received to transfer patient to 2- heart for further evaluation and monitoring.

## 2023-12-13 NOTE — Progress Notes (Signed)
 PHARMACY - ANTICOAGULATION CONSULT NOTE  Pharmacy Consult for heparin  Indication: atrial fibrillation  Allergies  Allergen Reactions   Bee Venom Anaphylaxis    Patient Measurements: Height: 6' 5 (195.6 cm) Weight: 132.1 kg (291 lb 3.6 oz) IBW/kg (Calculated) : 89.1 HEPARIN  DW (KG): 119  Vital Signs: Temp: 97.3 F (36.3 C) (10/05 0700) Temp Source: Oral (10/05 0700) BP: 95/81 (10/05 1800) Pulse Rate: 95 (10/05 1800)  Labs: Recent Labs    12/11/23 1807 12/12/23 0927 12/12/23 1752 12/12/23 2203 12/13/23 0534 12/13/23 1404 12/13/23 1748  HGB 17.1*  --  16.0 15.7  --   --   --   HCT 52.0  --  48.1 47.1  --   --   --   PLT 243  --  231 242  --   --   --   APTT  --   --   --   --   --   --  64*  CREATININE 1.74*   < > 1.65* 1.65* 1.60* 1.72*  --    < > = values in this interval not displayed.    Estimated Creatinine Clearance: 69.5 mL/min (A) (by C-G formula based on SCr of 1.72 mg/dL (H)).   Medications:   Infusions:   amiodarone  60 mg/hr (12/13/23 1700)   Followed by   amiodarone      furosemide  (LASIX ) 200 mg in dextrose  5 % 100 mL (2 mg/mL) infusion 20 mg/hr (12/13/23 1700)   heparin  1,600 Units/hr (12/13/23 1700)    Assessment: 58 YOM admitted with atrial fibrillation with rapid ventricular response. Patient underwent DCCV on 12/10/23 prior to this admission. Patient was shocked on 12/12/23 PM, now in NSR. Patient on Eliquis  prior to hospitalization, last dose on 12/11/23. Plan to start heparin  infusion prior to possible procedures. Pharmacy consulted for heparin  dosing.   12/13/23: Hgb 15.7, PLT 242, stable. Due to recent Eliquis  administration, will monitor heparin  levels and aPTT until correlating.   12/13/23 PM: aPTT drawn early, heparin  infusion only running for ~3 hours, close to therapeutic range at 64. Since this was drawn early but is close to therapeutic will continue rate and check another level in 8 hours.  Goal of Therapy:  Heparin  level 0.3-0.7  units/ml aPTT 66-102 Monitor platelets by anticoagulation protocol: Yes   Plan:  Continue Heparin  1600 units/hr Monitor aPTT in 8 hours Monitor aPTT, heparin  level, CBC, and s/sx of bleeding daily  Larraine Brazier, PharmD Clinical Pharmacist 12/13/2023  6:30 PM **Pharmacist phone directory can now be found on [email or web address] ([email or web address]).  Listed under [facility].

## 2023-12-13 NOTE — Progress Notes (Signed)
 PHARMACY - ANTICOAGULATION CONSULT NOTE  Pharmacy Consult for heparin  Indication: atrial fibrillation  Allergies  Allergen Reactions   Bee Venom Anaphylaxis    Patient Measurements: Height: 6' 5 (195.6 cm) Weight: 132.1 kg (291 lb 3.6 oz) IBW/kg (Calculated) : 89.1 HEPARIN  DW (KG): 119  Vital Signs: Temp: 97.3 F (36.3 C) (10/05 0700) Temp Source: Oral (10/05 0700) BP: 95/83 (10/05 1300) Pulse Rate: 52 (10/05 1300)  Labs: Recent Labs    12/11/23 1807 12/12/23 0927 12/12/23 1752 12/12/23 2203 12/13/23 0534  HGB 17.1*  --  16.0 15.7  --   HCT 52.0  --  48.1 47.1  --   PLT 243  --  231 242  --   CREATININE 1.74*   < > 1.65* 1.65* 1.60*   < > = values in this interval not displayed.    Estimated Creatinine Clearance: 74.7 mL/min (A) (by C-G formula based on SCr of 1.6 mg/dL (H)).   Medical History: Past Medical History:  Diagnosis Date   Chronic bronchitis    HTN (hypertension)    NICM (nonischemic cardiomyopathy) (HCC)    Obesity (BMI 30-39.9) 12/04/2015   OSA (obstructive sleep apnea) 08/19/2015   Severe with AHI 64/hr now on CPAP at 13cm H2O   PAF (paroxysmal atrial fibrillation) (HCC)    Systolic CHF, chronic (HCC)     Medications:  Scheduled:   amiodarone   150 mg Intravenous Once   Chlorhexidine  Gluconate Cloth  6 each Topical Daily   digoxin   0.125 mg Oral Daily   DULoxetine   20 mg Oral Daily   sodium chloride  flush  10-40 mL Intracatheter Q12H   sodium chloride  flush  10-40 mL Intracatheter Q12H   sodium chloride  flush  3 mL Intravenous Q12H   [START ON 12/14/2023] spironolactone   25 mg Oral Daily   tamsulosin   0.4 mg Oral QPC supper   [START ON 12/14/2023] torsemide   40 mg Oral BID   Infusions:   amiodarone      Followed by   amiodarone       Assessment: 58 YOM admitted with atrial fibrillation with rapid ventricular response. Patient underwent DCCV on 12/10/23 prior to this admission. Patient was shocked on 12/12/23 PM, now in NSR. Patient on  Eliquis  prior to hospitalization, last dose on 12/11/23. Plan to start heparin  infusion prior to possible procedures. Pharmacy consulted for heparin  dosing.   12/13/23: Hgb 15.7, PLT 242, stable. Due to recent Eliquis  administration, will monitor heparin  levels and aPTT until correlating.   Goal of Therapy:  Heparin  level 0.3-0.7 units/ml Monitor platelets by anticoagulation protocol: Yes   Plan:  Heparin  1600 units/hr Monitor aPTT in 6 hours Monitor aPTT, heparin  level, CBC, and s/sx of bleeding daily  Morna Breach, PharmD PGY2 Cardiology Pharmacy Resident 12/13/2023 1:39 PM

## 2023-12-14 ENCOUNTER — Other Ambulatory Visit: Payer: Self-pay

## 2023-12-14 DIAGNOSIS — R570 Cardiogenic shock: Secondary | ICD-10-CM | POA: Diagnosis not present

## 2023-12-14 DIAGNOSIS — I472 Ventricular tachycardia, unspecified: Secondary | ICD-10-CM | POA: Diagnosis not present

## 2023-12-14 DIAGNOSIS — I4892 Unspecified atrial flutter: Secondary | ICD-10-CM | POA: Diagnosis not present

## 2023-12-14 DIAGNOSIS — I5023 Acute on chronic systolic (congestive) heart failure: Secondary | ICD-10-CM | POA: Diagnosis not present

## 2023-12-14 LAB — COOXEMETRY PANEL
Carboxyhemoglobin: 0.5 % (ref 0.5–1.5)
Carboxyhemoglobin: 0.8 % (ref 0.5–1.5)
Carboxyhemoglobin: 0.8 % (ref 0.5–1.5)
Methemoglobin: 0.9 % (ref 0.0–1.5)
Methemoglobin: 1 % (ref 0.0–1.5)
Methemoglobin: 1.2 % (ref 0.0–1.5)
O2 Saturation: 46.2 %
O2 Saturation: 48.7 %
O2 Saturation: 58.2 %
Total hemoglobin: 16.5 g/dL — ABNORMAL HIGH (ref 12.0–16.0)
Total hemoglobin: 16.2 g/dL — ABNORMAL HIGH (ref 12.0–16.0)
Total hemoglobin: 16.1 g/dL — ABNORMAL HIGH (ref 12.0–16.0)

## 2023-12-14 LAB — GLUCOSE, CAPILLARY
Glucose-Capillary: 151 mg/dL — ABNORMAL HIGH (ref 70–99)
Glucose-Capillary: 114 mg/dL — ABNORMAL HIGH (ref 70–99)
Glucose-Capillary: 158 mg/dL — ABNORMAL HIGH (ref 70–99)
Glucose-Capillary: 111 mg/dL — ABNORMAL HIGH (ref 70–99)

## 2023-12-14 LAB — COMPREHENSIVE METABOLIC PANEL WITH GFR
ALT: 74 U/L — ABNORMAL HIGH (ref 0–44)
AST: 61 U/L — ABNORMAL HIGH (ref 15–41)
Albumin: 2.8 g/dL — ABNORMAL LOW (ref 3.5–5.0)
Alkaline Phosphatase: 52 U/L (ref 38–126)
Anion gap: 20 — ABNORMAL HIGH (ref 5–15)
BUN: 29 mg/dL — ABNORMAL HIGH (ref 6–20)
CO2: 22 mmol/L (ref 22–32)
Calcium: 7.6 mg/dL — ABNORMAL LOW (ref 8.9–10.3)
Chloride: 84 mmol/L — ABNORMAL LOW (ref 98–111)
Creatinine, Ser: 1.74 mg/dL — ABNORMAL HIGH (ref 0.61–1.24)
GFR, Estimated: 45 mL/min — ABNORMAL LOW (ref >60)
Glucose, Bld: 396 mg/dL — ABNORMAL HIGH (ref 70–99)
Potassium: 3.5 mmol/L (ref 3.5–5.1)
Sodium: 126 mmol/L — ABNORMAL LOW (ref 135–145)
Total Bilirubin: 1 mg/dL (ref 0.0–1.2)
Total Protein: 5.6 g/dL — ABNORMAL LOW (ref 6.5–8.1)

## 2023-12-14 LAB — BASIC METABOLIC PANEL WITH GFR
Anion gap: 16 — ABNORMAL HIGH (ref 5–15)
Anion gap: 14 (ref 5–15)
BUN: 28 mg/dL — ABNORMAL HIGH (ref 6–20)
BUN: 31 mg/dL — ABNORMAL HIGH (ref 6–20)
CO2: 23 mmol/L (ref 22–32)
CO2: 22 mmol/L (ref 22–32)
Calcium: 7.9 mg/dL — ABNORMAL LOW (ref 8.9–10.3)
Calcium: 8.1 mg/dL — ABNORMAL LOW (ref 8.9–10.3)
Chloride: 90 mmol/L — ABNORMAL LOW (ref 98–111)
Chloride: 90 mmol/L — ABNORMAL LOW (ref 98–111)
Creatinine, Ser: 1.61 mg/dL — ABNORMAL HIGH (ref 0.61–1.24)
Creatinine, Ser: 1.76 mg/dL — ABNORMAL HIGH (ref 0.61–1.24)
GFR, Estimated: 49 mL/min — ABNORMAL LOW (ref >60)
GFR, Estimated: 44 mL/min — ABNORMAL LOW (ref >60)
Glucose, Bld: 352 mg/dL — ABNORMAL HIGH (ref 70–99)
Glucose, Bld: 235 mg/dL — ABNORMAL HIGH (ref 70–99)
Potassium: 3 mmol/L — ABNORMAL LOW (ref 3.5–5.1)
Potassium: 3.9 mmol/L (ref 3.5–5.1)
Sodium: 129 mmol/L — ABNORMAL LOW (ref 135–145)
Sodium: 126 mmol/L — ABNORMAL LOW (ref 135–145)

## 2023-12-14 LAB — APTT
aPTT: 117 s — ABNORMAL HIGH (ref 24–36)
aPTT: 100 s — ABNORMAL HIGH (ref 24–36)
aPTT: 96 s — ABNORMAL HIGH (ref 24–36)

## 2023-12-14 LAB — LACTATE DEHYDROGENASE: LDH: 187 U/L (ref 98–192)

## 2023-12-14 LAB — RAPID URINE DRUG SCREEN, HOSP PERFORMED
Amphetamines: NOT DETECTED
Barbiturates: NOT DETECTED
Benzodiazepines: NOT DETECTED
Cocaine: NOT DETECTED
Opiates: NOT DETECTED
Tetrahydrocannabinol: NOT DETECTED

## 2023-12-14 LAB — MAGNESIUM: Magnesium: 2.1 mg/dL (ref 1.7–2.4)

## 2023-12-14 LAB — HEMOGLOBIN A1C
Hgb A1c MFr Bld: 5.5 % (ref 4.8–5.6)
Mean Plasma Glucose: 111.15 mg/dL

## 2023-12-14 LAB — LACTIC ACID, PLASMA: Lactic Acid, Venous: 1.3 mmol/L (ref 0.5–1.9)

## 2023-12-14 LAB — DIGOXIN LEVEL: Digoxin Level: 0.9 ng/mL (ref 0.8–2.0)

## 2023-12-14 MED ORDER — POTASSIUM CHLORIDE 10 MEQ/50ML IV SOLN
10.0000 meq | INTRAVENOUS | Status: AC
  Administered 2023-12-14 – 2023-12-15 (×5): 10 meq via INTRAVENOUS
  Filled 2023-12-14: qty 50

## 2023-12-14 MED ORDER — MILRINONE LACTATE IN DEXTROSE 20-5 MG/100ML-% IV SOLN
0.1250 ug/kg/min | INTRAVENOUS | Status: DC
  Administered 2023-12-14: 0.125 ug/kg/min via INTRAVENOUS
  Administered 2023-12-15 – 2023-12-18 (×7): 0.25 ug/kg/min via INTRAVENOUS
  Administered 2023-12-19 – 2023-12-20 (×3): 0.125 ug/kg/min via INTRAVENOUS
  Filled 2023-12-14 (×13): qty 100

## 2023-12-14 MED ORDER — LIDOCAINE BOLUS VIA INFUSION
100.0000 mg | Freq: Once | INTRAVENOUS | Status: DC
Start: 2023-12-14 — End: 2023-12-16
  Filled 2023-12-14: qty 100

## 2023-12-14 MED ORDER — POTASSIUM CHLORIDE CRYS ER 20 MEQ PO TBCR: 40.0000 meq | EXTENDED_RELEASE_TABLET | Freq: Once | ORAL | Status: DC

## 2023-12-14 MED ORDER — AMIODARONE IV BOLUS ONLY 150 MG/100ML: 150.0000 mg | Freq: Once | INTRAVENOUS | Status: DC

## 2023-12-14 MED ORDER — POTASSIUM CHLORIDE CRYS ER 20 MEQ PO TBCR
40.0000 meq | EXTENDED_RELEASE_TABLET | Freq: Three times a day (TID) | ORAL | Status: AC
  Administered 2023-12-14 (×2): 40 meq via ORAL
  Filled 2023-12-14 (×2): qty 2

## 2023-12-14 MED ORDER — LIDOCAINE HCL (CARDIAC) PF 100 MG/5ML IV SOSY
PREFILLED_SYRINGE | INTRAVENOUS | Status: AC
  Administered 2023-12-14: 100 mg
  Filled 2023-12-14: qty 5

## 2023-12-14 MED ORDER — ASPIRIN 81 MG PO CHEW
81.0000 mg | CHEWABLE_TABLET | ORAL | Status: AC
  Administered 2023-12-15: 81 mg via ORAL
  Filled 2023-12-14: qty 1

## 2023-12-14 MED ORDER — AMIODARONE LOAD VIA INFUSION
150.0000 mg | Freq: Once | INTRAVENOUS | Status: AC
  Administered 2023-12-14: 150 mg via INTRAVENOUS

## 2023-12-14 MED ORDER — INSULIN ASPART 100 UNIT/ML IJ SOLN
0.0000 [IU] | Freq: Three times a day (TID) | INTRAMUSCULAR | Status: DC
  Administered 2023-12-14 (×2): 3 [IU] via SUBCUTANEOUS
  Administered 2023-12-15: 2 [IU] via SUBCUTANEOUS
  Administered 2023-12-16: 5 [IU] via SUBCUTANEOUS

## 2023-12-14 MED ORDER — LIDOCAINE IN D5W 4-5 MG/ML-% IV SOLN
1.0000 mg/min | INTRAVENOUS | Status: DC
Start: 2023-12-14 — End: 2023-12-17
  Administered 2023-12-16: 1 mg/min via INTRAVENOUS
  Filled 2023-12-14 (×2): qty 500

## 2023-12-14 MED ORDER — DIGOXIN 125 MCG PO TABS
0.0625 mg | ORAL_TABLET | Freq: Every day | ORAL | Status: DC
  Administered 2023-12-14 – 2023-12-16 (×3): 0.0625 mg via ORAL
  Filled 2023-12-14 (×3): qty 1

## 2023-12-14 MED ORDER — MIDAZOLAM HCL 2 MG/2ML IJ SOLN
INTRAMUSCULAR | Status: AC
  Administered 2023-12-14: 1.5 mg
  Filled 2023-12-14: qty 2

## 2023-12-14 MED ORDER — POTASSIUM CHLORIDE CRYS ER 20 MEQ PO TBCR: 40.0000 meq | EXTENDED_RELEASE_TABLET | ORAL | Status: DC

## 2023-12-14 MED ORDER — POTASSIUM CHLORIDE CRYS ER 20 MEQ PO TBCR
40.0000 meq | EXTENDED_RELEASE_TABLET | Freq: Three times a day (TID) | ORAL | Status: DC
  Administered 2023-12-14: 40 meq via ORAL
  Filled 2023-12-14: qty 2

## 2023-12-14 MED ORDER — PHENYLEPHRINE 80 MCG/ML (10ML) SYRINGE FOR IV PUSH (FOR BLOOD PRESSURE SUPPORT)
PREFILLED_SYRINGE | INTRAVENOUS | Status: AC
  Filled 2023-12-14: qty 10

## 2023-12-14 MED ORDER — SODIUM CHLORIDE 0.9 % IV SOLN: INTRAVENOUS | Status: DC

## 2023-12-14 MED ORDER — LIDOCAINE IN D5W 4-5 MG/ML-% IV SOLN
INTRAVENOUS | Status: AC
  Administered 2023-12-14: 1 mg/min via INTRAVENOUS
  Filled 2023-12-14: qty 500

## 2023-12-14 NOTE — Telephone Encounter (Signed)
 Patient is currently admitted

## 2023-12-14 NOTE — H&P (View-Only) (Signed)
 Advanced Heart Failure Rounding Note  Cardiologist: Wilbert Bihari, MD  Chief Complaint: A/C HF Subjective:    CO-OX  49%. QX Calculate Fick CO 4 CI 1.2.    Denies SOB. Complaining of fatigue.    Objective:   Weight Range: 129.4 kg Body mass index is 33.83 kg/m.   Vital Signs:   Temp:  [96.2 F (35.7 C)-97.7 F (36.5 C)] 96.2 F (35.7 C) (10/06 0400) Pulse Rate:  [52-98] 79 (10/06 0600) Resp:  [17-34] 17 (10/06 0600) BP: (72-147)/(59-135) 92/68 (10/06 0600) SpO2:  [85 %-100 %] 93 % (10/06 0600) FiO2 (%):  [40 %] 40 % (10/06 0400) Weight:  [129.4 kg] 129.4 kg (10/06 0500) Last BM Date : 12/10/23  Weight change: Filed Weights   12/12/23 2324 12/13/23 0400 12/14/23 0500  Weight: (!) 137.4 kg 132.1 kg 129.4 kg    Intake/Output:   Intake/Output Summary (Last 24 hours) at 12/14/2023 0654 Last data filed at 12/14/2023 0600 Gross per 24 hour  Intake 2042.81 ml  Output 4650 ml  Net -2607.19 ml      Physical Exam   General:   No resp difficulty Neck: no JVD.  Cor: Regular rate & rhythm.  Lungs: clear Abdomen: soft, nontender, nondistended.  Extremities: no  edema Neuro: alert & oriented x3   Telemetry   SR with frequent PVCs.  EKG   N/A   Labs    CBC Recent Labs    12/12/23 1752 12/12/23 2203  WBC 12.2* 12.7*  HGB 16.0 15.7  HCT 48.1 47.1  MCV 87.8 87.4  PLT 231 242   Basic Metabolic Panel Recent Labs    89/95/74 2203 12/13/23 0534 12/13/23 1404 12/14/23 0437  NA 133* 133* 132* 129*  K 4.0 3.7 3.7 3.0*  CL 100 99 98 90*  CO2 23 22 24 23   GLUCOSE 137* 122* 149* 352*  BUN 29* 28* 30* 28*  CREATININE 1.65* 1.60* 1.72* 1.61*  CALCIUM  8.3* 8.4* 8.2* 7.9*  MG 2.2 2.4  --  2.1  PHOS 4.4  --   --   --    Liver Function Tests Recent Labs    12/12/23 1752 12/12/23 2203  AST 49* 45*  ALT 52* 51*  ALKPHOS 56 55  BILITOT 1.4* 1.1  PROT 5.9* 6.2*  ALBUMIN  3.0* 3.0*   No results for input(s): LIPASE, AMYLASE in the last 72  hours. Cardiac Enzymes No results for input(s): CKTOTAL, CKMB, CKMBINDEX, TROPONINI in the last 72 hours.  BNP: BNP (last 3 results) Recent Labs    01/13/23 1126 08/06/23 1522  BNP 70.0 36.5    ProBNP (last 3 results) Recent Labs    12/11/23 1807  PROBNP 6,368.0*     D-Dimer No results for input(s): DDIMER in the last 72 hours. Hemoglobin A1C No results for input(s): HGBA1C in the last 72 hours. Fasting Lipid Panel No results for input(s): CHOL, HDL, LDLCALC, TRIG, CHOLHDL, LDLDIRECT in the last 72 hours. Thyroid  Function Tests No results for input(s): TSH, T4TOTAL, T3FREE, THYROIDAB in the last 72 hours.  Invalid input(s): FREET3  Other results:   Imaging    No results found.   Medications:     Scheduled Medications:  Chlorhexidine  Gluconate Cloth  6 each Topical Daily   digoxin   0.125 mg Oral Daily   DULoxetine   20 mg Oral Daily   insulin  aspart  0-15 Units Subcutaneous TID WC   potassium chloride   40 mEq Oral TID   sodium chloride  flush  10-40  mL Intracatheter Q12H   sodium chloride  flush  10-40 mL Intracatheter Q12H   sodium chloride  flush  3 mL Intravenous Q12H   spironolactone   25 mg Oral Daily   tamsulosin   0.4 mg Oral QPC supper    Infusions:  amiodarone  30 mg/hr (12/14/23 0600)   furosemide  (LASIX ) 200 mg in dextrose  5 % 100 mL (2 mg/mL) infusion 20 mg/hr (12/14/23 0600)   heparin  1,450 Units/hr (12/14/23 0600)    PRN Medications: acetaminophen  **OR** acetaminophen , oxyCODONE , prochlorperazine, senna, sodium chloride  flush, sodium chloride  flush    Patient Profile   58 year old with a history of HFrEF, PAF, OSA, VT, CKD Stage IIIa.   HF following for cardiogenic shock.   Assessment/Plan   A/C HFrEF, Cardiogenic Shock  CT coronary 03/06/20. Calcium  score 0. Poor arterial imaging but no obvious CAD - CMRI 2017 LVEF 20%. No LGE - Cardiac PET 2023- no evidence for cardiac sarcoid or inflammatory.  LVEF 38%. - Echo 5/25 EF 30-35% -cMRI 2/25 EF 28 % RV 33% LGE pattern concerning for sarcoid  - Echo 12/12/23 EF < 15 severe RV HK - CO-OX 49%. QX Calculate Fick CO 4 CI 1.2. Add milrinone  0.125 mcg. Repeat CO-OX later this afternoon.  - Volume status improving. CVP 13. Continue lasix  drip.  - Dig level  0.9. Cut back digoxin  0.0625  - Check BMET this afternoon.  -GDMT limited by hypotension.  -Plan LHC/RHC tomorrow.  Informed Consent   Shared Decision Making/Informed Consent The risks [stroke (1 in 1000), death (1 in 1000), kidney failure [usually temporary] (1 in 500), bleeding (1 in 200), allergic reaction [possibly serious] (1 in 200)], benefits (diagnostic support and management of coronary artery disease) and alternatives of a cardiac catheterization were discussed in detail with Mr. Mckesson and he is willing to proceed.      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 - underwent DC-CV on 12/10/23 - Maintaining SR. Continue IV amio.Of note he has previously had difficulty taking amio due to nausea.  - Continue heparin   -Consult EP for potential AFL ablation (versus AVN/pacer) + ICD this admit   VT -S/P DC-CV  -Continue amio drip.  -EP consulted   Possible Sarcoid -PET? Consulted EP. -Will need PET once stabilized.   CKD Stage IIIa  -Creatinine baseline ~ 1.6-1.8 -Stable today.   OSA  -Continue CPAP   Length of Stay: 2  Greig Mosses, NP  12/14/2023, 6:54 AM  Advanced Heart Failure Team Pager [phone] (M-F; 7a - 5p)  Please contact [facility] for night-coverage after hours (5p -7a ) and weekends on [email or web address]

## 2023-12-14 NOTE — Progress Notes (Signed)
 PHARMACY - ANTICOAGULATION CONSULT NOTE  Pharmacy Consult for heparin  Indication: atrial fibrillation  Allergies  Allergen Reactions   Bee Venom Anaphylaxis    Patient Measurements: Height: 6' 5 (195.6 cm) Weight: 129.4 kg (285 lb 4.4 oz) IBW/kg (Calculated) : 89.1 HEPARIN  DW (KG): 119  Vital Signs: Temp: 98.1 F (36.7 C) (10/06 1131) Temp Source: Oral (10/06 1131) BP: 95/69 (10/06 1300) Pulse Rate: 88 (10/06 1300)  Labs: Recent Labs    12/11/23 1807 12/12/23 0927 12/12/23 1752 12/12/23 2203 12/13/23 0534 12/13/23 1404 12/13/23 1748 12/14/23 0058 12/14/23 0437 12/14/23 1322  HGB 17.1*  --  16.0 15.7  --   --   --   --   --   --   HCT 52.0  --  48.1 47.1  --   --   --   --   --   --   PLT 243  --  231 242  --   --   --   --   --   --   APTT  --   --   --   --   --   --    < > 117* 100* 96*  CREATININE 1.74*   < > 1.65* 1.65* 1.60* 1.72*  --   --  1.61*  --    < > = values in this interval not displayed.    Estimated Creatinine Clearance: 73.5 mL/min (A) (by C-G formula based on SCr of 1.61 mg/dL (H)).   Medications:   Infusions:   [START ON 12/15/2023] sodium chloride      amiodarone  30 mg/hr (12/14/23 1200)   furosemide  (LASIX ) 200 mg in dextrose  5 % 100 mL (2 mg/mL) infusion 20 mg/hr (12/14/23 1200)   heparin  1,450 Units/hr (12/14/23 1200)   milrinone  0.125 mcg/kg/min (12/14/23 1200)    Assessment: 58 YOM admitted with atrial fibrillation with rapid ventricular response. Patient underwent DCCV on 12/10/23 prior to this admission. Patient was shocked on 12/12/23 PM, now in NSR. Patient on Eliquis  prior to hospitalization, last dose on 12/11/23. Plan to start heparin  infusion prior to possible procedures. Pharmacy consulted for heparin  dosing.   aPTT this morning came back at 100 4 hours after rate reduction. aPTT is therapeutic at 96, on 1450 units/hr. No s/sx of bleeding or infusion issues.   Goal of Therapy:  Heparin  level 0.3-0.7 units/ml aPTT  66-102 Monitor platelets by anticoagulation protocol: Yes   Plan:  Continue heparin  infusion at 1450 units/hr Monitor aPTT, heparin  level, CBC, and s/sx of bleeding daily  Thank you for allowing pharmacy to participate in this patient's care,  Suzen Sour, PharmD, BCCCP Clinical Pharmacist  Phone: [phone] 12/14/2023 3:00 PM  Please check AMION for all [facility] Pharm[facility] phone numbers After 10:00 PM, call Main Pharmacy [phone]

## 2023-12-14 NOTE — Consult Note (Cosign Needed Addendum)
 ELECTROPHYSIOLOGY CONSULT NOTE    Patient ID: Harold Park MRN: [id number], DOB/AGE: Aug 05, 1965 58 y.o.  Admit date: 12/11/2023 Date of Consult: 12/14/2023  Primary Physician: Micheal [name], MD Primary Cardiologist: Wilbert Bihari, MD  Electrophysiologist: Dr. Nancey   Referring Provider: Dr. Zenaida  Patient Profile: Harold Park is a 58 y.o. male with a history of persistent AF, Typical and atypical AFL, Chronic biventricular failure, and Obesity who is being seen today for the evaluation of recurrent and poorly tolerated atrial arrhythmias at the request of Dr. Zenaida.  HPI:  Harold Park is a 58 y.o. male with medical history as above. Well know to Dr. Nancey with recent visit. Had planned for AF/AFL ablation with BIV ICD consideration later.   Pt underwent DCC on amiodarone  10/2. Reported to ED 10/3 with worsening dyspnea, orthopnea and 10 lb weight gain. Repeat TTE with EF <10% and RV failure, started on lasix  gtt and admitted. Evening of 10/4 he went into a WCT in 220s with chest pain and diaphoresis. Was urgently defibrillated and moved to unit.   Given history and recent planning for EP procedures, EP team asked to consult to help navigate his arrhythmia and procedural planning.   This am, no SOB at rest. Is complaining of fatigue. Milrinone  just started this am. Understands with VT he will require ICD prior to discharge, and will consider AV nodal ablation at the same time.   Labs Potassium3.0* (10/06 9562) Magnesium  2.1 (10/06 0437) Creatinine, ser  1.61* (10/06 0437) PLT  242 (10/04 2203) HGB  15.7 (10/04 2203) WBC 12.7* (10/04 2203)  .    Past Medical History:  Diagnosis Date   Chronic bronchitis    HTN (hypertension)    NICM (nonischemic cardiomyopathy) (HCC)    Obesity (BMI 30-39.9) 12/04/2015   OSA (obstructive sleep apnea) 08/19/2015   Severe with AHI 64/hr now on CPAP at 13cm H2O   PAF (paroxysmal atrial fibrillation) (HCC)    Systolic CHF,  chronic (HCC)      Surgical History:  Past Surgical History:  Procedure Laterality Date   APPENDECTOMY     ATRIAL FIBRILLATION ABLATION N/A 12/17/2022   Procedure: ATRIAL FIBRILLATION ABLATION;  Surgeon: Nancey [name], MD;  Location: [facility];  Service: Cardiovascular;  Laterality: N/A;   BRONCHIAL WASHINGS  04/24/2020   Procedure: BRONCHIAL WASHINGS;  Surgeon: [name], MD;  Location: [facility] ENDOSCOPY;  Service: Pulmonary;;   CARDIAC CATHETERIZATION     CARDIOVERSION N/A 06/19/2017   Procedure: CARDIOVERSION;  Surgeon: [name], MD;  Location: [facility] ENDOSCOPY;  Service: Cardiovascular;  Laterality: N/A;   CARDIOVERSION N/A 10/02/2022   Procedure: CARDIOVERSION;  Surgeon: [name], MD;  Location: [facility];  Service: Cardiovascular;  Laterality: N/A;   CARDIOVERSION N/A 10/20/2022   Procedure: CARDIOVERSION;  Surgeon: [name], MD;  Location: [facility];  Service: Cardiovascular;  Laterality: N/A;   CARDIOVERSION N/A 10/23/2022   Procedure: CARDIOVERSION;  Surgeon: [name], MD;  Location: [facility];  Service: Cardiovascular;  Laterality: N/A;   CARDIOVERSION N/A 11/13/2022   Procedure: CARDIOVERSION;  Surgeon: [name], MD;  Location: [facility];  Service: Cardiovascular;  Laterality: N/A;   CARDIOVERSION N/A 12/10/2023   Procedure: CARDIOVERSION;  Surgeon: Michele Richardson, DO;  Location: [facility];  Service: Cardiovascular;  Laterality: N/A;   FINE NEEDLE ASPIRATION  04/24/2020   Procedure: FINE NEEDLE ASPIRATION (FNA) LINEAR;  Surgeon: Shelah,  [name], MD;  Location: [facility] ENDOSCOPY;  Service: Pulmonary;;   LIPOMA RESECTION     arms   RIGHT HEART CATH N/A 10/30/2022   Procedure: RIGHT HEART CATH;  Surgeon: Gardenia Led, DO;  Location: [facility];  Service: Cardiovascular;  Laterality: N/A;   SPLENECTOMY     SPLIT NIGHT STUDY  08/12/2015   VIDEO BRONCHOSCOPY WITH ENDOBRONCHIAL  ULTRASOUND N/A 04/24/2020   Procedure: VIDEO BRONCHOSCOPY WITH ENDOBRONCHIAL ULTRASOUND;  Surgeon: [name], MD;  Location: [facility] ENDOSCOPY;  Service: Pulmonary;  Laterality: N/A;     Medications Prior to Admission  Medication Sig Dispense Refill Last Dose/Taking   albuterol  (VENTOLIN  HFA) 108 (90 Base) MCG/ACT inhaler TAKE 2 PUFFS BY MOUTH EVERY 6 HOURS AS NEEDED FOR WHEEZE OR SHORTNESS OF BREATH 8.5 each 2 Past Week   amiodarone  (PACERONE ) 200 MG tablet Take 2 tablets (400 mg total) by mouth 2 (two) times daily. 56 tablet 0 12/11/2023 Morning   apixaban  (ELIQUIS ) 5 MG TABS tablet TAKE 1 TABLET BY MOUTH TWICE A DAY 180 tablet 3 12/11/2023 Morning   digoxin  (LANOXIN ) 0.125 MG tablet Take 1 tablet (0.125 mg total) by mouth daily. 30 tablet 5 12/11/2023 Morning   DULoxetine  (CYMBALTA ) 20 MG capsule TAKE 1 CAPSULE BY MOUTH EVERY DAY 90 capsule 3 12/11/2023 Morning   empagliflozin  (JARDIANCE ) 10 MG TABS tablet Take 1 tablet (10 mg total) by mouth daily. 30 tablet 5 12/10/2023   Magnesium 250 MG TABS Take 250 mg by mouth every evening. Gummy   Past Week   potassium chloride  SA (KLOR-CON  M) 20 MEQ tablet Take 2 tablets (40 mEq total) by mouth 2 (two) times daily. 360 tablet 3 12/11/2023 Morning   spironolactone  (ALDACTONE ) 25 MG tablet Take 1 tablet (25 mg total) by mouth daily. 30 tablet 5 12/10/2023   tamsulosin  (FLOMAX ) 0.4 MG CAPS capsule Take 1 capsule (0.4 mg total) by mouth daily after supper. 30 capsule 6 12/09/2023   tirzepatide  (ZEPBOUND ) 12.5 MG/0.5ML Pen Inject 12.5 mg into the skin once a week. 2 mL 5 11/29/2023   torsemide  (DEMADEX ) 20 MG tablet Take 2 tablets (40 mg total) by mouth 2 (two) times daily. 360 tablet 0 12/11/2023 Morning   losartan  (COZAAR ) 25 MG tablet Take 1 tablet (25 mg total) by mouth daily. (Patient not taking: Reported on 12/12/2023) 30 tablet 6 Not Taking    Inpatient Medications:   Chlorhexidine  Gluconate Cloth  6 each Topical Daily   digoxin   0.0625 mg Oral Daily    DULoxetine   20 mg Oral Daily   insulin  aspart  0-15 Units Subcutaneous TID WC   potassium chloride   40 mEq Oral TID   sodium chloride  flush  10-40 mL Intracatheter Q12H   sodium chloride  flush  10-40 mL Intracatheter Q12H   sodium chloride  flush  3 mL Intravenous Q12H   spironolactone   25 mg Oral Daily   tamsulosin   0.4 mg Oral QPC supper    Allergies:  Allergies  Allergen Reactions   Bee Venom Anaphylaxis    Family History  Problem Relation Age of Onset   Breast cancer Mother    Cancer Father        blood cancer   Hypertension Other    Heart disease Other    Stomach cancer Neg Hx    Colon cancer Neg Hx    Esophageal cancer Neg Hx    Pancreatic cancer Neg Hx    Rectal cancer Neg Hx      Physical Exam:  Vitals:   12/14/23 0815 12/14/23 0830 12/14/23 0845 12/14/23 0900  BP: (!) 85/73   99/80  Pulse: 79 82 83 (!) 113  Resp: (!) 28 (!) 26 (!) 29 (!) 26  Temp:      TempSrc:      SpO2: 100% 96% 97% 94%  Weight:      Height:        GEN- NAD HEENT: Normocephalic, atraumatic Lungs- Normal effort.  Heart- Regular rate and rhythm, with occasional ectopy. GI- Soft, NT, ND.  Extremities- No clubbing, cyanosis, or edema   Radiology/Studies: ECHOCARDIOGRAM COMPLETE Result Date: 12/12/2023    ECHOCARDIOGRAM REPORT   Patient Name:   Lynford Espinoza Date of Exam: 12/12/2023 Medical Rec #:  [id number]       Height:       77.0 in Accession #:    [id number]      Weight:       301.6 lb Date of Birth:  03-18-1965       BSA:          2.663 m Patient Age:    58 years        BP:           115/87 mmHg Patient Gender: M               HR:           100 bpm. Exam Location:  Inpatient Procedure: 2D Echo, Cardiac Doppler, Color Doppler and Intracardiac            Opacification Agent (Both Spectral and Color Flow Doppler were            utilized during procedure). Indications:    Dyspnea  History:        Patient has prior history of Echocardiogram examinations, most                 recent 08/07/2023.  CHF, Arrythmias:PVC and Tachycardia; Risk                 Factors:Hypertension, Diabetes and Sleep Apnea. CKD.  Sonographer:    Philomena Daring Referring Phys: MAURICIO DANIEL ARRIEN IMPRESSIONS  1. No LV thrombus by Definity . Left ventricular ejection fraction, by estimation, is <10%. The left ventricle has severely decreased function. The left ventricle has no regional wall motion abnormalities. The left ventricular internal cavity size was moderately to severely dilated. There is mild left ventricular hypertrophy. Left ventricular diastolic parameters are indeterminate.  2. Right ventricular systolic function is severely reduced. The right ventricular size is moderately enlarged. There is normal pulmonary artery systolic pressure.  3. Left atrial size was severely dilated.  4. Right atrial size was mild to moderately dilated.  5. The mitral valve is grossly normal. Mild mitral valve regurgitation. No evidence of mitral stenosis.  6. The aortic valve was not well visualized. Aortic valve regurgitation is not visualized. No aortic stenosis is present.  7. The inferior vena cava is dilated in size with <50% respiratory variability, suggesting right atrial pressure of 15 mmHg.  8. Evidence of atrial level shunting detected by color flow Doppler. Comparison(s): Changes from prior study are noted. Severe biventricular failure. FINDINGS  Left Ventricle: No LV thrombus by Definity . Left ventricular ejection fraction, by estimation, is <10%. The left ventricle has severely decreased function. The left ventricle has no regional wall motion abnormalities. Definity  contrast agent was given IV to delineate the left ventricular endocardial borders. Strain was performed and the global longitudinal strain is indeterminate.  The left ventricular internal cavity size was moderately to severely dilated. There is mild left ventricular hypertrophy. Left ventricular diastolic parameters are indeterminate. Right Ventricle: The right  ventricular size is moderately enlarged. No increase in right ventricular wall thickness. Right ventricular systolic function is severely reduced. There is normal pulmonary artery systolic pressure. The tricuspid regurgitant velocity is 1.97 m/s, and with an assumed right atrial pressure of 15 mmHg, the estimated right ventricular systolic pressure is 30.5 mmHg. Left Atrium: Left atrial size was severely dilated. Right Atrium: Right atrial size was mild to moderately dilated. Pericardium: There is no evidence of pericardial effusion. Mitral Valve: The mitral valve is grossly normal. Mild mitral valve regurgitation. No evidence of mitral valve stenosis. Tricuspid Valve: The tricuspid valve is grossly normal. Tricuspid valve regurgitation is mild . No evidence of tricuspid stenosis. Aortic Valve: The aortic valve was not well visualized. Aortic valve regurgitation is not visualized. No aortic stenosis is present. Pulmonic Valve: The pulmonic valve was not well visualized. Pulmonic valve regurgitation is not visualized. No evidence of pulmonic stenosis. Aorta: The aortic root and ascending aorta are structurally normal, with no evidence of dilitation. Venous: The inferior vena cava is dilated in size with less than 50% respiratory variability, suggesting right atrial pressure of 15 mmHg. IAS/Shunts: Evidence of atrial level shunting detected by color flow Doppler. Additional Comments: 3D was performed not requiring image post processing on an independent workstation and was indeterminate.  LEFT VENTRICLE PLAX 2D LVIDd:         6.60 cm   Diastology LVIDs:         6.40 cm   LV e' medial:    6.31 cm/s LV PW:         1.30 cm   LV E/e' medial:  13.7 LV IVS:        1.30 cm   LV e' lateral:   7.29 cm/s LVOT diam:     2.30 cm   LV E/e' lateral: 11.9 LV SV:         16 LV SV Index:   6 LVOT Area:     4.15 cm  RIGHT VENTRICLE            IVC RV S prime:     9.03 cm/s  IVC diam: 3.50 cm TAPSE (M-mode): 1.5 cm LEFT ATRIUM               Index        RIGHT ATRIUM           Index LA diam:        4.90 cm  1.84 cm/m   RA Area:     28.20 cm LA Vol (A2C):   234.0 ml 87.85 ml/m  RA Volume:   85.10 ml  31.95 ml/m LA Vol (A4C):   187.0 ml 70.21 ml/m LA Biplane Vol: 224.0 ml 84.10 ml/m  AORTIC VALVE LVOT Vmax:   32.30 cm/s LVOT Vmean:  21.300 cm/s LVOT VTI:    0.038 m  AORTA Ao Root diam: 3.40 cm Ao Asc diam:  3.80 cm MITRAL VALVE               TRICUSPID VALVE MV Area (PHT): 7.44 cm    TR Peak grad:   15.5 mmHg MV Decel Time: 102 msec    TR Vmax:        197.00 cm/s MV E velocity: 86.40 cm/s  SHUNTS                            Systemic VTI:  0.04 m                            Systemic Diam: 2.30 cm Vishnu Priya Mallipeddi Electronically signed by Diannah Late Mallipeddi Signature Date/Time: 12/12/2023/12:43:45 PM    Final    US  EKG SITE RITE Result Date: 12/12/2023 If Site Rite image not attached, placement could not be confirmed due to current cardiac rhythm.  DG Chest 2 View Result Date: 12/11/2023 CLINICAL DATA:  Chest pain. EXAM: CHEST - 2 VIEW COMPARISON:  November 03, 2022. FINDINGS: Stable cardiomegaly with mild central pulmonary vascular congestion. No consolidative process. Bony thorax is unremarkable. IMPRESSION: Stable cardiomegaly with mild central pulmonary vascular congestion. Electronically Signed   By: Lynwood Landy Raddle M.D.   On: 12/11/2023 18:11   EP STUDY Result Date: 12/10/2023 See surgical note for result.   EKG: on 10/4 showed sinus tach with PVCs at 100 bpm (personally reviewed)  TELEMETRY: NSR with frequent PVCs. VT from 10/4 reviewed. (personally reviewed)  Assessment/Plan:  Paroxysmal AF Paroxysmal AFL, typical and atypical Tolerates very poorly. Had planned for ablation then ICD, but given recurrence may require AV nodal ablation and CRT-D.  Given VT this admission and need for shock Heparin  for procedures.   VT In setting of cardiogenic shock Will need CRT-D prior to discharge.   Continue amiodarone .  Will need to review [place] driving restrictions prior to discharge.   Acute on chronic biventricular failure HF team following [place] 49% this am > milrinone  0.125 mcg started Lasix  gtt on-going. CVP 13 today.   Ideally, would be optimized and have PICC line out for 24 hrs prior to device implantation.    For questions or updates, please contact [facility] Please consult [email or web address] for contact info under     Signed, [name], PA-C  12/14/2023, 9:57 AM

## 2023-12-14 NOTE — Plan of Care (Signed)
 Progressing Add All Education: Knowledge of General Education information will improve Add Today at 0023 - Progressing by Rolfe Corean HERO, RN Add Health Behavior/Discharge Planning: Ability to manage health-related needs will improve Add Today at 0023 - Progressing by Rolfe Corean HERO, RN Add Clinical Measurements: Ability to maintain clinical measurements within normal limits will improve Add Today at 0023 - Progressing by Rolfe Corean HERO, RN Add Will remain free from infection Add Today at 0023 - Progressing by Rolfe Corean HERO, RN Add Diagnostic test results will improve Add Today at 0023 - Progressing by Rolfe Corean HERO, RN Add Respiratory complications will improve Add Today at 0023 - Progressing by Rolfe Corean HERO, RN Add Cardiovascular complication will be avoided Add Today at 0023 - Progressing by Rolfe Corean HERO, RN Add Activity: Risk for activity intolerance will decrease Add Today at 0023 - Progressing by Rolfe Corean HERO, RN Add Nutrition: Adequate nutrition will be maintained Add Today at 0023 - Progressing by Rolfe Corean HERO, RN Add Coping: Level of anxiety will decrease Add Today at 0023 - Progressing by Rolfe Corean HERO, RN Add Elimination: Will not experience complications related to bowel motility Add Today at 0023 - Progressing by Rolfe Corean HERO, RN Add Will not experience complications related to urinary retention Add Today at 0023 - Progressing by Rolfe Corean HERO, RN Add Pain Managment: General experience of comfort will improve and/or be controlled Add Today at 0023 - Progressing by Rolfe Corean HERO, RN Add Safety: Ability to remain free from injury will improve Add Today at 0023 - Progressing by Rolfe Corean HERO, RN Add Skin Integrity: Risk for impaired skin integrity will decrease Add Today at 0023 - Progressing by Rolfe Corean HERO, RN Add Education: Ability to demonstrate management of disease process will improve Add Today at 0023 - Progressing by Rolfe Corean HERO, RN Add Ability to verbalize understanding of medication therapies will improve Add Today at 0023 - Progressing by Rolfe Corean HERO, RN Add Individualized Educational Video(s) Add Today at 0023 - Progressing by Rolfe Corean HERO, RN Add Activity: Capacity to carry out activities will improve Add Today at 0023 - Progressing by Rolfe Corean HERO, RN Add Cardiac: Ability to achieve and maintain adequate cardiopulmonary perfusion will improve Add Today at 0023 - Progressing by Rolfe Corean HERO, RN

## 2023-12-14 NOTE — Progress Notes (Signed)
 At 2218 patient went into VT with HR at 205. Patient was alert and talking, complaining of burning and tightness in his chest. Cardiology was paged and came to bedside. Patient received 150mg  amiodarone  bolus, 100mg  lidocaine  bolus followed by 1mg /min lidocaine  gtt, 1.5mg  versed  push, and was cardioverted with 200j at 2247. Patient in NSR with HR in 90s. Wife called and updated.

## 2023-12-14 NOTE — Progress Notes (Signed)
   12/14/23 2332  BiPAP/CPAP/SIPAP  BiPAP/CPAP/SIPAP Pt Type Adult  BiPAP/CPAP/SIPAP Resmed  Mask Type Nasal pillows  Dentures removed? Not applicable  EPAP 6 cmH2O  FiO2 (%) 40 %  Patient Home Machine Yes  Safety Check Completed by RT for Home Unit Yes, no issues noted  Patient Home Mask Yes  Patient Home Tubing Yes  CPAP/SIPAP surface wiped down Yes  Device Plugged into RED Power Outlet Yes

## 2023-12-14 NOTE — Progress Notes (Signed)
 PHARMACY - ANTICOAGULATION Pharmacy Consult for heparin  Indication: atrial fibrillation Brief A/P: aPTT supratherapeutic Decrease Heparin  rate  Allergies  Allergen Reactions   Bee Venom Anaphylaxis    Patient Measurements: Height: 6' 5 (195.6 cm) Weight: 132.1 kg (291 lb 3.6 oz) IBW/kg (Calculated) : 89.1 HEPARIN  DW (KG): 119  Vital Signs: Temp: 97.7 F (36.5 C) (10/06 0000) Temp Source: Oral (10/06 0000) BP: 87/73 (10/06 0000) Pulse Rate: 90 (10/06 0000)  Labs: Recent Labs    12/11/23 1807 12/12/23 0927 12/12/23 1752 12/12/23 2203 12/13/23 0534 12/13/23 1404 12/13/23 1748 12/14/23 0058  HGB 17.1*  --  16.0 15.7  --   --   --   --   HCT 52.0  --  48.1 47.1  --   --   --   --   PLT 243  --  231 242  --   --   --   --   APTT  --   --   --   --   --   --  64* 117*  CREATININE 1.74*   < > 1.65* 1.65* 1.60* 1.72*  --   --    < > = values in this interval not displayed.    Estimated Creatinine Clearance: 69.5 mL/min (A) (by C-G formula based on SCr of 1.72 mg/dL (H)).   Assessment: 58 y.o. male with h/o Afib, Eliquis  on hold, for heparin   Goal of Therapy:  Heparin  level 0.3-0.7 units/ml aPTT 66-102 sec Monitor platelets by anticoagulation protocol: Yes   Plan:  Decrease Heparin  1450 units/hr Follow-up am labs.     Cathlyn Arrant, PharmD, BCPS

## 2023-12-14 NOTE — Progress Notes (Addendum)
 Advanced Heart Failure Rounding Note  Cardiologist: Wilbert Bihari, MD  Chief Complaint: A/C HF Subjective:    CO-OX  49%. QX Calculate Fick CO 4 CI 1.2.    Denies SOB. Complaining of fatigue.    Objective:   Weight Range: 129.4 kg Body mass index is 33.83 kg/m.   Vital Signs:   Temp:  [96.2 F (35.7 C)-97.7 F (36.5 C)] 96.2 F (35.7 C) (10/06 0400) Pulse Rate:  [52-98] 79 (10/06 0600) Resp:  [17-34] 17 (10/06 0600) BP: (72-147)/(59-135) 92/68 (10/06 0600) SpO2:  [85 %-100 %] 93 % (10/06 0600) FiO2 (%):  [40 %] 40 % (10/06 0400) Weight:  [129.4 kg] 129.4 kg (10/06 0500) Last BM Date : 12/10/23  Weight change: Filed Weights   12/12/23 2324 12/13/23 0400 12/14/23 0500  Weight: (!) 137.4 kg 132.1 kg 129.4 kg    Intake/Output:   Intake/Output Summary (Last 24 hours) at 12/14/2023 0654 Last data filed at 12/14/2023 0600 Gross per 24 hour  Intake 2042.81 ml  Output 4650 ml  Net -2607.19 ml      Physical Exam   General:   No resp difficulty Neck: no JVD.  Cor: Regular rate & rhythm.  Lungs: clear Abdomen: soft, nontender, nondistended.  Extremities: no  edema Neuro: alert & oriented x3   Telemetry   SR with frequent PVCs.  EKG   N/A   Labs    CBC Recent Labs    12/12/23 1752 12/12/23 2203  WBC 12.2* 12.7*  HGB 16.0 15.7  HCT 48.1 47.1  MCV 87.8 87.4  PLT 231 242   Basic Metabolic Panel Recent Labs    89/95/74 2203 12/13/23 0534 12/13/23 1404 12/14/23 0437  NA 133* 133* 132* 129*  K 4.0 3.7 3.7 3.0*  CL 100 99 98 90*  CO2 23 22 24 23   GLUCOSE 137* 122* 149* 352*  BUN 29* 28* 30* 28*  CREATININE 1.65* 1.60* 1.72* 1.61*  CALCIUM  8.3* 8.4* 8.2* 7.9*  MG 2.2 2.4  --  2.1  PHOS 4.4  --   --   --    Liver Function Tests Recent Labs    12/12/23 1752 12/12/23 2203  AST 49* 45*  ALT 52* 51*  ALKPHOS 56 55  BILITOT 1.4* 1.1  PROT 5.9* 6.2*  ALBUMIN  3.0* 3.0*   No results for input(s): LIPASE, AMYLASE in the last 72  hours. Cardiac Enzymes No results for input(s): CKTOTAL, CKMB, CKMBINDEX, TROPONINI in the last 72 hours.  BNP: BNP (last 3 results) Recent Labs    01/13/23 1126 08/06/23 1522  BNP 70.0 36.5    ProBNP (last 3 results) Recent Labs    12/11/23 1807  PROBNP 6,368.0*     D-Dimer No results for input(s): DDIMER in the last 72 hours. Hemoglobin A1C No results for input(s): HGBA1C in the last 72 hours. Fasting Lipid Panel No results for input(s): CHOL, HDL, LDLCALC, TRIG, CHOLHDL, LDLDIRECT in the last 72 hours. Thyroid  Function Tests No results for input(s): TSH, T4TOTAL, T3FREE, THYROIDAB in the last 72 hours.  Invalid input(s): FREET3  Other results:   Imaging    No results found.   Medications:     Scheduled Medications:  Chlorhexidine  Gluconate Cloth  6 each Topical Daily   digoxin   0.125 mg Oral Daily   DULoxetine   20 mg Oral Daily   insulin  aspart  0-15 Units Subcutaneous TID WC   potassium chloride   40 mEq Oral TID   sodium chloride  flush  10-40  mL Intracatheter Q12H   sodium chloride  flush  10-40 mL Intracatheter Q12H   sodium chloride  flush  3 mL Intravenous Q12H   spironolactone   25 mg Oral Daily   tamsulosin   0.4 mg Oral QPC supper    Infusions:  amiodarone  30 mg/hr (12/14/23 0600)   furosemide  (LASIX ) 200 mg in dextrose  5 % 100 mL (2 mg/mL) infusion 20 mg/hr (12/14/23 0600)   heparin  1,450 Units/hr (12/14/23 0600)    PRN Medications: acetaminophen  **OR** acetaminophen , oxyCODONE , prochlorperazine, senna, sodium chloride  flush, sodium chloride  flush    Patient Profile   58 year old with a history of HFrEF, PAF, OSA, VT, CKD Stage IIIa.   HF following for cardiogenic shock.   Assessment/Plan   A/C HFrEF, Cardiogenic Shock  CT coronary 03/06/20. Calcium  score 0. Poor arterial imaging but no obvious CAD - CMRI 2017 LVEF 20%. No LGE - Cardiac PET 2023- no evidence for cardiac sarcoid or inflammatory.  LVEF 38%. - Echo 5/25 EF 30-35% -cMRI 2/25 EF 28 % RV 33% LGE pattern concerning for sarcoid  - Echo 12/12/23 EF < 15 severe RV HK - CO-OX 49%. QX Calculate Fick CO 4 CI 1.2. Add milrinone  0.125 mcg. Repeat CO-OX later this afternoon.  - Volume status improving. CVP 13. Continue lasix  drip.  - Dig level  0.9. Cut back digoxin  0.0625  - Check BMET this afternoon.  -GDMT limited by hypotension.  -Plan LHC/RHC tomorrow.  Informed Consent   Shared Decision Making/Informed Consent The risks [stroke (1 in 1000), death (1 in 1000), kidney failure [usually temporary] (1 in 500), bleeding (1 in 200), allergic reaction [possibly serious] (1 in 200)], benefits (diagnostic support and management of coronary artery disease) and alternatives of a cardiac catheterization were discussed in detail with Mr. Mckesson and he is willing to proceed.      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 - underwent DC-CV on 12/10/23 - Maintaining SR. Continue IV amio.Of note he has previously had difficulty taking amio due to nausea.  - Continue heparin   -Consult EP for potential AFL ablation (versus AVN/pacer) + ICD this admit   VT -S/P DC-CV  -Continue amio drip.  -EP consulted   Possible Sarcoid -PET? Consulted EP. -Will need PET once stabilized.   CKD Stage IIIa  -Creatinine baseline ~ 1.6-1.8 -Stable today.   OSA  -Continue CPAP   Length of Stay: 2  Greig Mosses, NP  12/14/2023, 6:54 AM  Advanced Heart Failure Team Pager [phone] (M-F; 7a - 5p)  Please contact [facility] for night-coverage after hours (5p -7a ) and weekends on [email or web address]

## 2023-12-14 NOTE — Progress Notes (Signed)
 Heart Failure Navigator Progress Note  Assessed for Heart & Vascular TOC clinic readiness.  Patient does not meet criteria due to Advanced Heart Failure Team patient of Dr. Gala Romney. .   Navigator will sign off at this time.   Rhae Hammock, BSN, Scientist, clinical (histocompatibility and immunogenetics) Only

## 2023-12-14 NOTE — Progress Notes (Deleted)
   12/14/23 2330  BiPAP/CPAP/SIPAP  BiPAP/CPAP/SIPAP Pt Type Adult  Reason BIPAP/CPAP not in use Non-compliant

## 2023-12-15 ENCOUNTER — Encounter (HOSPITAL_COMMUNITY): Admission: EM | Disposition: E | Payer: Self-pay | Source: Home / Self Care | Attending: Cardiology

## 2023-12-15 DIAGNOSIS — I4892 Unspecified atrial flutter: Secondary | ICD-10-CM | POA: Diagnosis not present

## 2023-12-15 DIAGNOSIS — R570 Cardiogenic shock: Secondary | ICD-10-CM | POA: Diagnosis not present

## 2023-12-15 DIAGNOSIS — I5023 Acute on chronic systolic (congestive) heart failure: Secondary | ICD-10-CM | POA: Diagnosis not present

## 2023-12-15 DIAGNOSIS — I472 Ventricular tachycardia, unspecified: Secondary | ICD-10-CM | POA: Diagnosis not present

## 2023-12-15 HISTORY — PX: RIGHT HEART CATH AND CORONARY ANGIOGRAPHY: CATH118264

## 2023-12-15 LAB — POCT I-STAT EG7
Acid-Base Excess: 0 mmol/L (ref 0.0–2.0)
Acid-Base Excess: 0 mmol/L (ref 0.0–2.0)
Bicarbonate: 25.1 mmol/L (ref 20.0–28.0)
Bicarbonate: 25.2 mmol/L (ref 20.0–28.0)
Calcium, Ion: 1.11 mmol/L — ABNORMAL LOW (ref 1.15–1.40)
Calcium, Ion: 1.12 mmol/L — ABNORMAL LOW (ref 1.15–1.40)
HCT: 49 % (ref 39.0–52.0)
HCT: 49 % (ref 39.0–52.0)
Hemoglobin: 16.7 g/dL (ref 13.0–17.0)
Hemoglobin: 16.7 g/dL (ref 13.0–17.0)
O2 Saturation: 67 %
O2 Saturation: 67 %
Potassium: 3.7 mmol/L (ref 3.5–5.1)
Potassium: 3.8 mmol/L (ref 3.5–5.1)
Sodium: 130 mmol/L — ABNORMAL LOW (ref 135–145)
Sodium: 131 mmol/L — ABNORMAL LOW (ref 135–145)
TCO2: 26 mmol/L (ref 22–32)
TCO2: 26 mmol/L (ref 22–32)
pCO2, Ven: 42.2 mmHg — ABNORMAL LOW (ref 44–60)
pCO2, Ven: 42.6 mmHg — ABNORMAL LOW (ref 44–60)
pH, Ven: 7.378 (ref 7.25–7.43)
pH, Ven: 7.384 (ref 7.25–7.43)
pO2, Ven: 36 mmHg (ref 32–45)
pO2, Ven: 36 mmHg (ref 32–45)

## 2023-12-15 LAB — APTT: aPTT: 82 s — ABNORMAL HIGH (ref 24–36)

## 2023-12-15 LAB — TYPE AND SCREEN
ABO/RH(D): O POS
Antibody Screen: NEGATIVE

## 2023-12-15 LAB — HEPARIN LEVEL (UNFRACTIONATED)
Heparin Unfractionated: 0.53 [IU]/mL (ref 0.30–0.70)
Heparin Unfractionated: 0.33 [IU]/mL (ref 0.30–0.70)

## 2023-12-15 LAB — BASIC METABOLIC PANEL WITH GFR
Anion gap: 11 (ref 5–15)
Anion gap: 16 — ABNORMAL HIGH (ref 5–15)
BUN: 29 mg/dL — ABNORMAL HIGH (ref 6–20)
BUN: 28 mg/dL — ABNORMAL HIGH (ref 6–20)
CO2: 24 mmol/L (ref 22–32)
CO2: 21 mmol/L — ABNORMAL LOW (ref 22–32)
Calcium: 8.1 mg/dL — ABNORMAL LOW (ref 8.9–10.3)
Calcium: 7.6 mg/dL — ABNORMAL LOW (ref 8.9–10.3)
Chloride: 91 mmol/L — ABNORMAL LOW (ref 98–111)
Chloride: 86 mmol/L — ABNORMAL LOW (ref 98–111)
Creatinine, Ser: 1.79 mg/dL — ABNORMAL HIGH (ref 0.61–1.24)
Creatinine, Ser: 1.74 mg/dL — ABNORMAL HIGH (ref 0.61–1.24)
GFR, Estimated: 43 mL/min — ABNORMAL LOW (ref >60)
GFR, Estimated: 45 mL/min — ABNORMAL LOW (ref >60)
Glucose, Bld: 252 mg/dL — ABNORMAL HIGH (ref 70–99)
Glucose, Bld: 400 mg/dL — ABNORMAL HIGH (ref 70–99)
Potassium: 3.6 mmol/L (ref 3.5–5.1)
Potassium: 3.2 mmol/L — ABNORMAL LOW (ref 3.5–5.1)
Sodium: 126 mmol/L — ABNORMAL LOW (ref 135–145)
Sodium: 123 mmol/L — ABNORMAL LOW (ref 135–145)

## 2023-12-15 LAB — CBC
HCT: 45.4 % (ref 39.0–52.0)
Hemoglobin: 15.6 g/dL (ref 13.0–17.0)
MCH: 29.6 pg (ref 26.0–34.0)
MCHC: 34.4 g/dL (ref 30.0–36.0)
MCV: 86.1 fL (ref 80.0–100.0)
Platelets: 226 K/uL (ref 150–400)
RBC: 5.27 MIL/uL (ref 4.22–5.81)
RDW: 14.1 % (ref 11.5–15.5)
WBC: 11.7 K/uL — ABNORMAL HIGH (ref 4.0–10.5)
nRBC: 0.3 % — ABNORMAL HIGH (ref 0.0–0.2)

## 2023-12-15 LAB — MAGNESIUM
Magnesium: 2 mg/dL (ref 1.7–2.4)
Magnesium: 2 mg/dL (ref 1.7–2.4)

## 2023-12-15 LAB — COOXEMETRY PANEL
Carboxyhemoglobin: 1.5 % (ref 0.5–1.5)
Carboxyhemoglobin: 1.5 % (ref 0.5–1.5)
Methemoglobin: 1 % (ref 0.0–1.5)
Methemoglobin: <0.7 % (ref 0.0–1.5)
O2 Saturation: 60.4 %
O2 Saturation: 60 %
Total hemoglobin: 15.9 g/dL (ref 12.0–16.0)
Total hemoglobin: 15.3 g/dL (ref 12.0–16.0)

## 2023-12-15 LAB — GLUCOSE, CAPILLARY
Glucose-Capillary: 153 mg/dL — ABNORMAL HIGH (ref 70–99)
Glucose-Capillary: 117 mg/dL — ABNORMAL HIGH (ref 70–99)
Glucose-Capillary: 126 mg/dL — ABNORMAL HIGH (ref 70–99)
Glucose-Capillary: 218 mg/dL — ABNORMAL HIGH (ref 70–99)

## 2023-12-15 LAB — LIDOCAINE LEVEL: Lidocaine Lvl: 3.6 ug/mL (ref 1.5–5.0)

## 2023-12-15 LAB — POCT ACTIVATED CLOTTING TIME: Activated Clotting Time: 141 s

## 2023-12-15 SURGERY — RIGHT HEART CATH AND CORONARY ANGIOGRAPHY
Anesthesia: LOCAL

## 2023-12-15 MED ORDER — SODIUM CHLORIDE 0.9 % IV SOLN
1000.0000 mg | INTRAVENOUS | Status: AC
  Administered 2023-12-15 – 2023-12-17 (×3): 1000 mg via INTRAVENOUS
  Filled 2023-12-15 (×4): qty 16

## 2023-12-15 MED ORDER — STERILE WATER FOR INJECTION IJ SOLN
INTRAMUSCULAR | Status: AC
  Administered 2023-12-15: 5 mL
  Filled 2023-12-15: qty 10

## 2023-12-15 MED ORDER — LIDOCAINE HCL (PF) 1 % IJ SOLN
INTRAMUSCULAR | Status: DC | PRN
  Administered 2023-12-15: 5 mL
  Administered 2023-12-15 (×2): 2 mL

## 2023-12-15 MED ORDER — HEPARIN SODIUM (PORCINE) 1000 UNIT/ML IJ SOLN
INTRAMUSCULAR | Status: DC | PRN
  Administered 2023-12-15: 5000 [IU] via INTRAVENOUS

## 2023-12-15 MED ORDER — VERAPAMIL HCL 2.5 MG/ML IV SOLN
INTRAVENOUS | Status: AC
  Filled 2023-12-15: qty 2

## 2023-12-15 MED ORDER — POTASSIUM CHLORIDE CRYS ER 20 MEQ PO TBCR: 40.0000 meq | EXTENDED_RELEASE_TABLET | Freq: Once | ORAL | Status: DC

## 2023-12-15 MED ORDER — MIDAZOLAM HCL 2 MG/2ML IJ SOLN
INTRAMUSCULAR | Status: AC
  Filled 2023-12-15: qty 2

## 2023-12-15 MED ORDER — FUROSEMIDE 10 MG/ML IJ SOLN
80.0000 mg | Freq: Once | INTRAMUSCULAR | Status: AC
  Administered 2023-12-15: 80 mg via INTRAVENOUS
  Filled 2023-12-15: qty 8

## 2023-12-15 MED ORDER — FENTANYL CITRATE (PF) 100 MCG/2ML IJ SOLN
INTRAMUSCULAR | Status: DC | PRN
  Administered 2023-12-15: 25 ug via INTRAVENOUS

## 2023-12-15 MED ORDER — ACETAZOLAMIDE SODIUM 500 MG IJ SOLR
500.0000 mg | Freq: Four times a day (QID) | INTRAMUSCULAR | Status: AC
  Administered 2023-12-15 (×2): 500 mg via INTRAVENOUS
  Filled 2023-12-15 (×3): qty 500

## 2023-12-15 MED ORDER — LIDOCAINE HCL (PF) 1 % IJ SOLN
INTRAMUSCULAR | Status: AC
  Filled 2023-12-15: qty 30

## 2023-12-15 MED ORDER — HEPARIN (PORCINE) IN NACL 1000-0.9 UT/500ML-% IV SOLN
INTRAVENOUS | Status: DC | PRN
  Administered 2023-12-15: 1500 mL

## 2023-12-15 MED ORDER — IOHEXOL 350 MG/ML SOLN
INTRAVENOUS | Status: DC | PRN
  Administered 2023-12-15: 25 mL

## 2023-12-15 MED ORDER — HEPARIN (PORCINE) 25000 UT/250ML-% IV SOLN
2100.0000 [IU]/h | INTRAVENOUS | Status: DC
  Administered 2023-12-16 – 2023-12-25 (×13): 1450 [IU]/h via INTRAVENOUS
  Administered 2023-12-25: 1500 [IU]/h via INTRAVENOUS
  Administered 2023-12-26: 1600 [IU]/h via INTRAVENOUS
  Administered 2023-12-27 (×2): 1700 [IU]/h via INTRAVENOUS
  Administered 2023-12-28: 1800 [IU]/h via INTRAVENOUS
  Administered 2023-12-29: 2100 [IU]/h via INTRAVENOUS
  Filled 2023-12-15 (×21): qty 250

## 2023-12-15 MED ORDER — VERAPAMIL HCL 2.5 MG/ML IV SOLN
INTRAVENOUS | Status: DC | PRN
  Administered 2023-12-15: 10 mL via INTRA_ARTERIAL

## 2023-12-15 MED ORDER — FUROSEMIDE 10 MG/ML IJ SOLN
30.0000 mg/h | INTRAVENOUS | Status: AC
  Administered 2023-12-15 – 2023-12-16 (×6): 30 mg/h via INTRAVENOUS
  Filled 2023-12-15 (×6): qty 20

## 2023-12-15 MED ORDER — MIDAZOLAM HCL 2 MG/2ML IJ SOLN
INTRAMUSCULAR | Status: DC | PRN
  Administered 2023-12-15: 1 mg via INTRAVENOUS

## 2023-12-15 MED ORDER — POTASSIUM CHLORIDE CRYS ER 20 MEQ PO TBCR
40.0000 meq | EXTENDED_RELEASE_TABLET | ORAL | Status: AC
Start: 2023-12-15 — End: 2023-12-15
  Administered 2023-12-15 (×3): 40 meq via ORAL
  Filled 2023-12-15 (×3): qty 2

## 2023-12-15 MED ORDER — FENTANYL CITRATE (PF) 100 MCG/2ML IJ SOLN
INTRAMUSCULAR | Status: AC
  Filled 2023-12-15: qty 2

## 2023-12-15 SURGICAL SUPPLY — 15 items
CATH 5FR JL3.5 JR4 ANG PIG MP (CATHETERS) IMPLANT
CATH INFINITI 5F JL4 125CM (CATHETERS) IMPLANT
CATH INFINITI 5FR JR4 125CM (CATHETERS) IMPLANT
CATH SWAN GANZ 7F STRAIGHT (CATHETERS) IMPLANT
DEVICE RAD COMP TR BAND LRG (VASCULAR PRODUCTS) IMPLANT
GLIDESHEATH SLEND SS 6F .021 (SHEATH) IMPLANT
GLIDESHEATH SLENDER 7FR .021G (SHEATH) IMPLANT
GUIDEWIRE .025 260CM (WIRE) IMPLANT
GUIDEWIRE INQWIRE 1.5J.035X260 (WIRE) IMPLANT
KIT SYRINGE INJ CVI SPIKEX1 (MISCELLANEOUS) IMPLANT
PACK CARDIAC CATHETERIZATION (CUSTOM PROCEDURE TRAY) ×1 IMPLANT
SET ATX-X65L (MISCELLANEOUS) IMPLANT
SHEATH PINNACLE 5F 10CM (SHEATH) IMPLANT
SHEATH PROBE COVER 6X72 (BAG) IMPLANT
WIRE MICRO SET SILHO 5FR 7 (SHEATH) IMPLANT

## 2023-12-15 NOTE — Progress Notes (Addendum)
 PHARMACY - ANTICOAGULATION CONSULT NOTE  Pharmacy Consult for heparin  Indication: atrial fibrillation  Allergies  Allergen Reactions   Bee Venom Anaphylaxis    Patient Measurements: Height: 6' 5 (195.6 cm) Weight: (!) 136.4 kg (300 lb 11.3 oz) IBW/kg (Calculated) : 89.1 HEPARIN  DW (KG): 119  Vital Signs: Temp: 98.1 F (36.7 C) (10/06 2340) Temp Source: Oral (10/06 2340) BP: 118/73 (10/07 0928) Pulse Rate: 85 (10/07 0928)  Labs: Recent Labs    12/12/23 1752 12/12/23 2203 12/13/23 0534 12/14/23 0437 12/14/23 1322 12/14/23 2251 12/15/23 0406  HGB 16.0 15.7  --   --   --   --  15.6  HCT 48.1 47.1  --   --   --   --  45.4  PLT 231 242  --   --   --   --  226  APTT  --   --    < > 100* 96*  --  82*  HEPARINUNFRC  --   --   --   --   --   --  0.53  CREATININE 1.65* 1.65*   < > 1.61* 1.76* 1.74* 1.79*   < > = values in this interval not displayed.    Estimated Creatinine Clearance: 67.9 mL/min (A) (by C-G formula based on SCr of 1.79 mg/dL (H)).   Medications:   Infusions:   amiodarone  30 mg/hr (12/15/23 0700)   heparin  1,450 Units/hr (12/15/23 0700)   lidocaine  1 mg/min (12/15/23 0700)   milrinone  0.125 mcg/kg/min (12/15/23 0700)    Assessment: 58 YOM admitted with atrial fibrillation with rapid ventricular response. Patient underwent DCCV on 12/10/23 prior to this admission. Patient was shocked on 12/12/23 PM, now in NSR. Patient on Eliquis  prior to hospitalization, last dose on 12/11/23. Plan to start heparin  infusion prior to possible procedures. Pharmacy consulted for heparin  dosing.   12/15/23 AM: Heparin  level 0.53 and aPTT 82, correlating and both therapeutic on heparin  1450 units/hr. Will now utilize heparin  levels for dosing. No issues with infusion running or signs of bleeding per RN. CBC stable (Hgb 15.6, PLT 226). Patient now back for cath lab, heparin  infusion was paused and remains off. Per HF MD, plan to restart heparin  3 hours after sheath removal.    Update: Per RN, sheath removed at ~1130. Will restart heparin  1450 units/hr at 1500.   Goal of Therapy:  Heparin  level 0.3-0.7 units/ml aPTT 66-102 Monitor platelets by anticoagulation protocol: Yes   Plan:  Restart heparin  infusion at 1450 units/hr at 1500 (3 hours after sheath removal) Monitor heparin  level 6 hours after heparin  resumed Monitor heparin  level, CBC, and s/sx of bleeding daily  Thank you for allowing pharmacy to participate in this patient's care,  Morna Breach, PharmD PGY2 Cardiology Pharmacy Resident 12/15/2023 10:18 AM  Please check AMION for all [facility] Pharmacy phone numbers After 10:00 PM, call Main Pharmacy [phone]

## 2023-12-15 NOTE — Progress Notes (Signed)
 PHARMACY - ANTICOAGULATION CONSULT NOTE  Pharmacy Consult for heparin  Indication: atrial fibrillation  Allergies  Allergen Reactions   Bee Venom Anaphylaxis    Patient Measurements: Height: 6' 5 (195.6 cm) Weight: (!) 136.4 kg (300 lb 11.3 oz) IBW/kg (Calculated) : 89.1 HEPARIN  DW (KG): 119  Vital Signs: Temp: 97.6 F (36.4 C) (10/07 2000) Temp Source: Axillary (10/07 2000) BP: 89/78 (10/07 2100) Pulse Rate: 84 (10/07 2100)  Labs: Recent Labs    12/12/23 2203 12/13/23 0534 12/14/23 0437 12/14/23 1322 12/14/23 2251 12/15/23 0406 12/15/23 0831 12/15/23 1739 12/15/23 2038  HGB 15.7  --   --   --   --  15.6 16.7  16.7  --   --   HCT 47.1  --   --   --   --  45.4 49.0  49.0  --   --   PLT 242  --   --   --   --  226  --   --   --   APTT  --    < > 100* 96*  --  82*  --   --   --   HEPARINUNFRC  --   --   --   --   --  0.53  --   --  0.33  CREATININE 1.65*   < > 1.61* 1.76* 1.74* 1.79*  --  1.74*  --    < > = values in this interval not displayed.    Estimated Creatinine Clearance: 69.8 mL/min (A) (by C-G formula based on SCr of 1.74 mg/dL (H)).   Medications:   Infusions:   amiodarone  60 mg/hr (12/15/23 2100)   furosemide  (LASIX ) 200 mg in dextrose  5 % 100 mL (2 mg/mL) infusion 30 mg/hr (12/15/23 2100)   heparin  1,450 Units/hr (12/15/23 2100)   lidocaine  1 mg/min (12/15/23 2100)   methylPREDNISolone  (SOLU-MEDROL ) injection Stopped (12/15/23 1726)   milrinone  0.25 mcg/kg/min (12/15/23 2100)    Assessment: 58 YOM admitted with atrial fibrillation with rapid ventricular response. Patient underwent DCCV on 12/10/23 prior to this admission. Patient was shocked on 12/12/23 PM, now in NSR. Patient on Eliquis  prior to hospitalization, last dose on 12/11/23. Plan to start heparin  infusion prior to possible procedures. Pharmacy consulted for heparin  dosing.   12/15/23 AM: Heparin  level 0.53 and aPTT 82, correlating and both therapeutic on heparin  1450 units/hr. Will now  utilize heparin  levels for dosing. No issues with infusion running or signs of bleeding per RN. CBC stable (Hgb 15.6, PLT 226). Patient now back for cath lab, heparin  infusion was paused and remains off. Per HF MD, plan to restart heparin  3 hours after sheath removal.   Update: Per RN, sheath removed at ~1130. Will restart heparin  1450 units/hr at 1500.   10/7 PM: Heparin  level therapeutic at 0.33 with heparin  running at 1450 units/h. Pt previously therapeutic on this rate. No signs of bleeding or issues with the heparin  infusion noted.    Goal of Therapy:  Heparin  level 0.3-0.7 units/ml aPTT 66-102 Monitor platelets by anticoagulation protocol: Yes   Plan:  Continue heparin  infusion at 1450 units/hr Monitor heparin  level, CBC, and s/sx of bleeding daily  Thank you for allowing pharmacy to participate in this patient's care,  Massie Fila, PharmD Clinical Pharmacist  12/15/2023 9:10 PM ;

## 2023-12-15 NOTE — Progress Notes (Signed)
  Patient Name: Harold Park Date of Encounter: 12/15/2023  Primary Cardiologist: Wilbert Bihari, MD Electrophysiologist: Eulas FORBES Furb[place], MD  Interval Summary   Pt with VT overnight requiring urgent defib. Fatigued this am, just getting back from [facility].   Vital Signs    Vitals:   12/15/23 0913 12/15/23 0918 12/15/23 0923 12/15/23 0928  BP: 114/67 123/83 123/69 118/73  Pulse: 84 83 86 85  Resp: (!) 25 (!) 22 (!) 21 (!) 25  Temp:      TempSrc:      SpO2: 94% 91% 96% 97%  Weight:      Height:        Intake/Output Summary (Last 24 hours) at 12/15/2023 0936 Last data filed at 12/15/2023 0700 Gross per 24 hour  Intake 1470.28 ml  Output 1750 ml  Net -279.72 ml   Filed Weights   12/14/23 0500 12/14/23 1800 12/15/23 0500  Weight: 129.4 kg (!) 136.3 kg (!) 136.4 kg    Physical Exam    GEN- NAD, Alert and oriented  Lungs- Clear to ausculation bilaterally, normal work of breathing Cardiac- Regular rate and rhythm with frequent ectopy, no murmurs, rubs or gallops GI- soft, NT, ND, + BS Extremities- no clubbing or cyanosis. No edema  Telemetry    NSR with frequent PVCs, episode of MMVT noted overnight requiring defib (personally reviewed)  Hospital Course    Harold Park is a 58 y.o. male with a history of persistent AF, Typical and atypical AFL, Chronic biventricular failure, and Obesity who is being seen today for the evaluation of recurrent and poorly tolerated atrial arrhythmias at the request of Dr. Zenaida.   Assessment & Plan     VT In setting of cardiogenic shock Will need CRT-D prior to discharge pending course Further overnight Continue IV amiodarone .  No driving x 6 months.    Paroxysmal AF Paroxysmal AFL, typical and atypical Tolerates very poorly. Had planned for ablation then ICD, but given recurrence will likely require AV nodal ablation and CRT-D given VT this admission and need for shock Heparin  for procedures.   Acute on chronic  biventricular failure HF team following With VT overnight taken for L/RHC this am.  Formal report pending but by verbal report continued low output and elevated filling pressures.  Milrinone  increased post cath. Lasix  gtt on-going.   Patient is critically ill and in danger of multiple system organ failure.  Prognosis remains guarded.    For questions or updates, please contact [facility] Please consult [email or web address] for contact info under     Signed, [name], PA-C  12/15/2023, 9:36 AM

## 2023-12-15 NOTE — Progress Notes (Signed)
 Called to bedside by ICU team. Patient was in monotrophic VT with MAPs in the high 60s (see 12 lead). Patient with some chest discomfort but mentating well. Immediately given amio and lido bolus and initiated lido drip but at subsequent BP measurement MAP was in the 40s and so admistered DCCV @ 200J x1 with successful cardioversion to SR. Patient tolerated the cardioversion well. Versed  given just prior to cardioversion for comfort.

## 2023-12-15 NOTE — Progress Notes (Signed)
 Advanced Heart Failure Rounding Note  Cardiologist: Wilbert Bihari, MD  Chief Complaint: A/C HF Subjective:   10/6 Started on Milirnone 0.125 for low output.    Developed VT last night with shock x1. Sleeping at the time but described some chest burning. Lidocaine  added. K supplemented  Complaining of fatigue.   Objective:   Weight Range: (!) 136.4 kg Body mass index is 35.66 kg/m.   Vital Signs:   Temp:  [97.7 F (36.5 C)-99 F (37.2 C)] 98.1 F (36.7 C) (10/06 2340) Pulse Rate:  [55-203] 82 (10/07 0645) Resp:  [16-32] 22 (10/07 0645) BP: (51-141)/(40-115) 87/60 (10/07 0645) SpO2:  [74 %-100 %] 74 % (10/07 0645) FiO2 (%):  [40 %] 40 % (10/06 2332) Weight:  [136.3 kg-136.4 kg] 136.4 kg (10/07 0500) Last BM Date : 12/14/23  Weight change: Filed Weights   12/14/23 0500 12/14/23 1800 12/15/23 0500  Weight: 129.4 kg (!) 136.3 kg (!) 136.4 kg    Intake/Output:   Intake/Output Summary (Last 24 hours) at 12/15/2023 0702 Last data filed at 12/15/2023 0600 Gross per 24 hour  Intake 1521.89 ml  Output 2450 ml  Net -928.11 ml      Physical Exam   General:   No resp difficulty Neck: no JVD.  Cor: Regular rate & rhythm. Lungs: clear Abdomen: soft, nontender, nondistended.  Extremities: no  edema Neuro: alert & oriented x3   Telemetry   SR with PVCs.   EKG   N/A   Labs    CBC Recent Labs    12/12/23 2203 12/15/23 0406  WBC 12.7* 11.7*  HGB 15.7 15.6  HCT 47.1 45.4  MCV 87.4 86.1  PLT 242 226   Basic Metabolic Panel Recent Labs    89/95/74 2203 12/13/23 0534 12/14/23 2251 12/15/23 0406  NA 133*   < > 126* 126*  K 4.0   < > 3.5 3.6  CL 100   < > 84* 91*  CO2 23   < > 22 24  GLUCOSE 137*   < > 396* 252*  BUN 29*   < > 29* 29*  CREATININE 1.65*   < > 1.74* 1.79*  CALCIUM  8.3*   < > 7.6* 8.1*  MG 2.2   < > 2.0 2.0  PHOS 4.4  --   --   --    < > = values in this interval not displayed.   Liver Function Tests Recent Labs     12/12/23 2203 12/14/23 2251  AST 45* 61*  ALT 51* 74*  ALKPHOS 55 52  BILITOT 1.1 1.0  PROT 6.2* 5.6*  ALBUMIN  3.0* 2.8*   No results for input(s): LIPASE, AMYLASE in the last 72 hours. Cardiac Enzymes No results for input(s): CKTOTAL, CKMB, CKMBINDEX, TROPONINI in the last 72 hours.  BNP: BNP (last 3 results) Recent Labs    01/13/23 1126 08/06/23 1522  BNP 70.0 36.5    ProBNP (last 3 results) Recent Labs    12/11/23 1807  PROBNP 6,368.0*     D-Dimer No results for input(s): DDIMER in the last 72 hours. Hemoglobin A1C Recent Labs    12/14/23 0738  HGBA1C 5.5   Fasting Lipid Panel No results for input(s): CHOL, HDL, LDLCALC, TRIG, CHOLHDL, LDLDIRECT in the last 72 hours. Thyroid  Function Tests No results for input(s): TSH, T4TOTAL, T3FREE, THYROIDAB in the last 72 hours.  Invalid input(s): FREET3  Other results:   Imaging    No results found.   Medications:  Scheduled Medications:  Chlorhexidine  Gluconate Cloth  6 each Topical Daily   digoxin   0.0625 mg Oral Daily   DULoxetine   20 mg Oral Daily   insulin  aspart  0-15 Units Subcutaneous TID WC   lidocaine   100 mg Intravenous Once   sodium chloride  flush  10-40 mL Intracatheter Q12H   sodium chloride  flush  10-40 mL Intracatheter Q12H   sodium chloride  flush  3 mL Intravenous Q12H   spironolactone   25 mg Oral Daily   tamsulosin   0.4 mg Oral QPC supper    Infusions:  sodium chloride  10 mL/hr at 12/15/23 0601   amiodarone  30 mg/hr (12/15/23 0600)   furosemide  (LASIX ) 200 mg in dextrose  5 % 100 mL (2 mg/mL) infusion 20 mg/hr (12/15/23 0600)   heparin  1,450 Units/hr (12/15/23 0600)   lidocaine  1 mg/min (12/15/23 0600)   milrinone  0.125 mcg/kg/min (12/15/23 0600)    PRN Medications: acetaminophen  **OR** acetaminophen , oxyCODONE , prochlorperazine, senna, sodium chloride  flush, sodium chloride  flush    Patient Profile   58 year old with a history of  HFrEF, PAF, OSA, VT, CKD Stage IIIa.   HF following for cardiogenic shock.   Assessment/Plan   A/C HFrEF, Cardiogenic Shock  CT coronary 03/06/20. Calcium  score 0. Poor arterial imaging but no obvious CAD - CMRI 2017 LVEF 20%. No LGE - Cardiac PET 2023- no evidence for cardiac sarcoid or inflammatory. LVEF 38%. - Echo 5/25 EF 30-35% -cMRI 2/25 EF 28 % RV 33% LGE pattern concerning for sarcoid  - Echo 12/12/23 EF < 15 severe RV HK - CO-OX 49%. QX Calculate Fick CO 4 CI 1.2. Add milrinone  0.125 mcg.  - Dig level  0.9-->Cut back digoxin  0.0625  - Continue milrinone .  - Hold lasix  drip and adjust post cath.   -GDMT limited by hypotension.  -Plan LHC/RHC today    A Fib RVR S/p AFL ablation 10/24. - underwent DC-CV on 12/10/23 - Maintaining SR. Continue IV amio.Of note he has previously had difficulty taking amio due to nausea.  - Continue heparin   -Consult EP for potential AFL ablation (versus AVN/pacer) + ICD this admit   VT -S/P DC-CV 10/4  -S/P DC- CV 10/6  -Continue amio drip + lidocaine  1. Check lidocaine  level.  -EP following.   Possible Sarcoid -PET? Consulted EP. -Will need PET once stabilized.   CKD Stage IIIa  -Creatinine baseline ~ 1.6-1.8  OSA  -Continue CPAP   Cath this morning. Adjust diuretics post cath.   Length of Stay: 3  Harold Gudino, NP  12/15/2023, 7:02 AM  Advanced Heart Failure Team Pager [phone] (M-F; 7a - 5p)  Please contact [facility] for night-coverage after hours (5p -7a ) and weekends on [email or web address]

## 2023-12-15 NOTE — Progress Notes (Signed)
   12/15/23 2259  BiPAP/CPAP/SIPAP  BiPAP/CPAP/SIPAP Pt Type Adult  BiPAP/CPAP/SIPAP Resmed  Mask Type Nasal pillows  Dentures removed? Not applicable  EPAP 6 cmH2O  FiO2 (%) 28 %  Patient Home Machine Yes  Safety Check Completed by RT for Home Unit Yes, no issues noted  Patient Home Mask Yes  Patient Home Tubing Yes  Auto Titrate No  CPAP/SIPAP surface wiped down Yes  Device Plugged into RED Power Outlet Yes  BiPAP/CPAP /SiPAP Vitals  Pulse Rate 84  Resp (!) 21  SpO2 97 %  MEWS Score/Color  MEWS Score 2  MEWS Score Color Yellow

## 2023-12-15 NOTE — Interval H&P Note (Signed)
 History and Physical Interval Note:  12/15/2023 8:12 AM  [name]  has presented today for surgery, with the diagnosis of hf.  The various methods of treatment have been discussed with the patient and family. After consideration of risks, benefits and other options for treatment, the patient has consented to  Procedure(s): RIGHT/LEFT HEART CATH AND CORONARY ANGIOGRAPHY (N/A) as a surgical intervention.  The patient's history has been reviewed, patient examined, no change in status, stable for surgery.  I have reviewed the patient's chart and labs.  Questions were answered to the patient's satisfaction.     Harold Park

## 2023-12-16 DIAGNOSIS — I4892 Unspecified atrial flutter: Secondary | ICD-10-CM | POA: Diagnosis not present

## 2023-12-16 DIAGNOSIS — I5023 Acute on chronic systolic (congestive) heart failure: Secondary | ICD-10-CM | POA: Diagnosis not present

## 2023-12-16 DIAGNOSIS — R570 Cardiogenic shock: Secondary | ICD-10-CM | POA: Diagnosis not present

## 2023-12-16 DIAGNOSIS — I472 Ventricular tachycardia, unspecified: Secondary | ICD-10-CM | POA: Diagnosis not present

## 2023-12-16 LAB — CBC WITH DIFFERENTIAL/PLATELET
Abs Immature Granulocytes: 0.08 K/uL — ABNORMAL HIGH (ref 0.00–0.07)
Basophils Absolute: 0 K/uL (ref 0.0–0.1)
Basophils Relative: 0 %
Eosinophils Absolute: 0 K/uL (ref 0.0–0.5)
Eosinophils Relative: 0 %
HCT: 41.9 % (ref 39.0–52.0)
Hemoglobin: 14.4 g/dL (ref 13.0–17.0)
Immature Granulocytes: 1 %
Lymphocytes Relative: 4 %
Lymphs Abs: 0.5 K/uL — ABNORMAL LOW (ref 0.7–4.0)
MCH: 29.3 pg (ref 26.0–34.0)
MCHC: 34.4 g/dL (ref 30.0–36.0)
MCV: 85.3 fL (ref 80.0–100.0)
Monocytes Absolute: 0.2 K/uL (ref 0.1–1.0)
Monocytes Relative: 1 %
Neutro Abs: 11.1 K/uL — ABNORMAL HIGH (ref 1.7–7.7)
Neutrophils Relative %: 94 %
Platelets: 198 K/uL (ref 150–400)
RBC: 4.91 MIL/uL (ref 4.22–5.81)
RDW: 14.1 % (ref 11.5–15.5)
Smear Review: NORMAL
WBC: 11.9 K/uL — ABNORMAL HIGH (ref 4.0–10.5)
nRBC: 0.2 % (ref 0.0–0.2)

## 2023-12-16 LAB — APTT: aPTT: 88 s — ABNORMAL HIGH (ref 24–36)

## 2023-12-16 LAB — BASIC METABOLIC PANEL WITH GFR
Anion gap: 15 (ref 5–15)
Anion gap: 18 — ABNORMAL HIGH (ref 5–15)
BUN: 34 mg/dL — ABNORMAL HIGH (ref 6–20)
BUN: 40 mg/dL — ABNORMAL HIGH (ref 6–20)
CO2: 20 mmol/L — ABNORMAL LOW (ref 22–32)
CO2: 19 mmol/L — ABNORMAL LOW (ref 22–32)
Calcium: 8.2 mg/dL — ABNORMAL LOW (ref 8.9–10.3)
Calcium: 8.2 mg/dL — ABNORMAL LOW (ref 8.9–10.3)
Chloride: 84 mmol/L — ABNORMAL LOW (ref 98–111)
Chloride: 82 mmol/L — ABNORMAL LOW (ref 98–111)
Creatinine, Ser: 1.86 mg/dL — ABNORMAL HIGH (ref 0.61–1.24)
Creatinine, Ser: 2.18 mg/dL — ABNORMAL HIGH (ref 0.61–1.24)
GFR, Estimated: 41 mL/min — ABNORMAL LOW (ref >60)
GFR, Estimated: 34 mL/min — ABNORMAL LOW (ref >60)
Glucose, Bld: 388 mg/dL — ABNORMAL HIGH (ref 70–99)
Glucose, Bld: 369 mg/dL — ABNORMAL HIGH (ref 70–99)
Potassium: 3.6 mmol/L (ref 3.5–5.1)
Potassium: 4.2 mmol/L (ref 3.5–5.1)
Sodium: 119 mmol/L — CL (ref 135–145)
Sodium: 119 mmol/L — CL (ref 135–145)

## 2023-12-16 LAB — GLUCOSE, CAPILLARY
Glucose-Capillary: 202 mg/dL — ABNORMAL HIGH (ref 70–99)
Glucose-Capillary: 169 mg/dL — ABNORMAL HIGH (ref 70–99)
Glucose-Capillary: 166 mg/dL — ABNORMAL HIGH (ref 70–99)
Glucose-Capillary: 277 mg/dL — ABNORMAL HIGH (ref 70–99)

## 2023-12-16 LAB — HEPATIC FUNCTION PANEL
ALT: 55 U/L — ABNORMAL HIGH (ref 0–44)
AST: 36 U/L (ref 15–41)
Albumin: 2.6 g/dL — ABNORMAL LOW (ref 3.5–5.0)
Alkaline Phosphatase: 49 U/L (ref 38–126)
Bilirubin, Direct: 0.2 mg/dL (ref 0.0–0.2)
Indirect Bilirubin: 0.6 mg/dL (ref 0.3–0.9)
Total Bilirubin: 0.8 mg/dL (ref 0.0–1.2)
Total Protein: 5.5 g/dL — ABNORMAL LOW (ref 6.5–8.1)

## 2023-12-16 LAB — COOXEMETRY PANEL
Carboxyhemoglobin: 1.7 % — ABNORMAL HIGH (ref 0.5–1.5)
Methemoglobin: <0.7 % (ref 0.0–1.5)
O2 Saturation: 70.5 %
Total hemoglobin: 15.6 g/dL (ref 12.0–16.0)

## 2023-12-16 LAB — LIDOCAINE LEVEL: Lidocaine Lvl: 4.7 ug/mL (ref 1.5–5.0)

## 2023-12-16 LAB — MAGNESIUM: Magnesium: 2.2 mg/dL (ref 1.7–2.4)

## 2023-12-16 LAB — HEPARIN LEVEL (UNFRACTIONATED): Heparin Unfractionated: 0.44 [IU]/mL (ref 0.30–0.70)

## 2023-12-16 MED ORDER — INSULIN ASPART 100 UNIT/ML IJ SOLN
0.0000 [IU] | Freq: Three times a day (TID) | INTRAMUSCULAR | Status: DC
  Administered 2023-12-16 (×2): 4 [IU] via SUBCUTANEOUS
  Administered 2023-12-17 – 2023-12-18 (×4): 7 [IU] via SUBCUTANEOUS
  Administered 2023-12-18: 4 [IU] via SUBCUTANEOUS
  Administered 2023-12-18: 7 [IU] via SUBCUTANEOUS
  Administered 2023-12-19: 11 [IU] via SUBCUTANEOUS
  Administered 2023-12-19: 7 [IU] via SUBCUTANEOUS
  Administered 2023-12-19: 11 [IU] via SUBCUTANEOUS
  Administered 2023-12-20 (×3): 4 [IU] via SUBCUTANEOUS
  Administered 2023-12-21: 3 [IU] via SUBCUTANEOUS
  Administered 2023-12-21 (×2): 4 [IU] via SUBCUTANEOUS
  Administered 2023-12-22: 3 [IU] via SUBCUTANEOUS

## 2023-12-16 MED ORDER — VITAMIN D 25 MCG (1000 UNIT) PO TABS
1000.0000 [IU] | ORAL_TABLET | Freq: Every day | ORAL | Status: DC
  Administered 2023-12-16 – 2023-12-23 (×8): 1000 [IU] via ORAL
  Filled 2023-12-16 (×8): qty 1

## 2023-12-16 MED ORDER — INSULIN GLARGINE 100 UNIT/ML ~~LOC~~ SOLN
10.0000 [IU] | Freq: Every day | SUBCUTANEOUS | Status: DC
  Administered 2023-12-16: 10 [IU] via SUBCUTANEOUS
  Filled 2023-12-16 (×2): qty 0.1

## 2023-12-16 MED ORDER — MYCOPHENOLATE MOFETIL 250 MG PO CAPS
500.0000 mg | ORAL_CAPSULE | Freq: Two times a day (BID) | ORAL | Status: DC
  Filled 2023-12-16: qty 2

## 2023-12-16 MED ORDER — PANTOPRAZOLE SODIUM 40 MG PO TBEC
40.0000 mg | DELAYED_RELEASE_TABLET | Freq: Every day | ORAL | Status: DC
  Administered 2023-12-16 – 2023-12-23 (×8): 40 mg via ORAL
  Filled 2023-12-16 (×8): qty 1

## 2023-12-16 MED ORDER — INSULIN ASPART 100 UNIT/ML IJ SOLN
0.0000 [IU] | Freq: Every day | INTRAMUSCULAR | Status: DC
  Administered 2023-12-16: 3 [IU] via SUBCUTANEOUS
  Administered 2023-12-17 – 2023-12-18 (×2): 2 [IU] via SUBCUTANEOUS

## 2023-12-16 MED ORDER — OYSTER SHELL CALCIUM/D3 500-5 MG-MCG PO TABS: 2.0000 | ORAL_TABLET | Freq: Every day | ORAL | Status: DC

## 2023-12-16 MED ORDER — POTASSIUM CHLORIDE CRYS ER 20 MEQ PO TBCR
40.0000 meq | EXTENDED_RELEASE_TABLET | ORAL | Status: AC
  Administered 2023-12-16 (×2): 40 meq via ORAL
  Filled 2023-12-16 (×2): qty 2

## 2023-12-16 MED ORDER — MYCOPHENOLATE MOFETIL 250 MG PO CAPS
500.0000 mg | ORAL_CAPSULE | Freq: Two times a day (BID) | ORAL | Status: DC
  Administered 2023-12-17 – 2023-12-18 (×3): 500 mg via ORAL
  Filled 2023-12-16 (×4): qty 2

## 2023-12-16 MED ORDER — CALCIUM CITRATE 950 (200 CA) MG PO TABS
200.0000 mg | ORAL_TABLET | Freq: Every day | ORAL | Status: DC
  Administered 2023-12-16 – 2023-12-23 (×8): 950 mg via ORAL
  Filled 2023-12-16 (×10): qty 1

## 2023-12-16 NOTE — Plan of Care (Signed)
  Problem: Education: Goal: Knowledge of General Education information will improve Description: Including pain rating scale, medication(s)/side effects and non-pharmacologic comfort measures Outcome: Progressing   Problem: Health Behavior/Discharge Planning: Goal: Ability to manage health-related needs will improve Outcome: Progressing   Problem: Clinical Measurements: Goal: Ability to maintain clinical measurements within normal limits will improve Outcome: Progressing Goal: Will remain free from infection Outcome: Progressing   Problem: Nutrition: Goal: Adequate nutrition will be maintained Outcome: Progressing   Problem: Elimination: Goal: Will not experience complications related to urinary retention Outcome: Progressing   Problem: Pain Managment: Goal: General experience of comfort will improve and/or be controlled Outcome: Progressing   Problem: Safety: Goal: Ability to remain free from injury will improve Outcome: Progressing   Problem: Activity: Goal: Capacity to carry out activities will improve Outcome: Progressing   Problem: Cardiac: Goal: Ability to achieve and maintain adequate cardiopulmonary perfusion will improve Outcome: Progressing   Problem: Coping: Goal: Ability to adjust to condition or change in health will improve Outcome: Progressing   Problem: Metabolic: Goal: Ability to maintain appropriate glucose levels will improve Outcome: Progressing   Problem: Nutritional: Goal: Maintenance of adequate nutrition will improve Outcome: Progressing Goal: Progress toward achieving an optimal weight will improve Outcome: Progressing   Problem: Tissue Perfusion: Goal: Adequacy of tissue perfusion will improve Outcome: Progressing   Problem: Cardiovascular: Goal: Vascular access site(s) Level 0-1 will be maintained Outcome: Progressing

## 2023-12-16 NOTE — Plan of Care (Signed)
  Problem: Clinical Measurements: Goal: Will remain free from infection Outcome: Progressing Goal: Diagnostic test results will improve Outcome: Progressing Goal: Respiratory complications will improve Outcome: Progressing Goal: Cardiovascular complication will be avoided Outcome: Progressing   Problem: Nutrition: Goal: Adequate nutrition will be maintained Outcome: Progressing   Problem: Coping: Goal: Level of anxiety will decrease Outcome: Progressing   Problem: Pain Managment: Goal: General experience of comfort will improve and/or be controlled Outcome: Progressing

## 2023-12-16 NOTE — Progress Notes (Addendum)
  Patient Name: Harold Park Date of Encounter: 12/16/2023  Primary Cardiologist: Wilbert Bihari, MD Electrophysiologist: Eulas FORBES Furbish, MD  Interval Summary   The patient is doing well today.  At this time, the patient denies chest pain, shortness of breath, or any new concerns.  Vital Signs    Vitals:   12/16/23 0600 12/16/23 0630 12/16/23 0702 12/16/23 0800  BP: 96/76 (!) 88/72 92/73 98/74   Pulse: 82 80 85 82  Resp: 20 (!) 23 (!) 22 18  Temp:      TempSrc:      SpO2: 96% 96% 94% 93%  Weight:      Height:        Intake/Output Summary (Last 24 hours) at 12/16/2023 0912 Last data filed at 12/16/2023 0800 Gross per 24 hour  Intake 1768.73 ml  Output 2850 ml  Net -1081.27 ml   Filed Weights   12/14/23 1800 12/15/23 0500 12/16/23 0530  Weight: (!) 136.3 kg (!) 136.4 kg 133.9 kg    Physical Exam    GEN- NAD, Alert and oriented  Lungs- Clear to ausculation bilaterally, normal work of breathing Cardiac- Regular rate and rhythm, no murmurs, rubs or gallops GI- soft, NT, ND, + BS Extremities- no clubbing or cyanosis. No edema  Telemetry    NSR 70-80s with occasional PVCs (personally reviewed)  Hospital Course    Harold Park is a 58 y.o. male with a history of persistent AF, Typical and atypical AFL, Chronic biventricular failure, and Obesity who is being seen today for the evaluation of recurrent and poorly tolerated atrial arrhythmias and VT at the request of Dr. Zenaida.   Assessment & Plan    VT In setting of cardiogenic shock Will need CRT-D prior to discharge pending course Now quiescent.  Continue IV amiodarone  while on inotropes.  Will discuss timing of lidocaine  transition to mexitil No driving x 6 months.    Paroxysmal AF Paroxysmal AFL, typical and atypical Tolerates very poorly. Had planned for ablation then ICD, but given recurrence will likely require AV nodal ablation and CRT-D given VT this admission and need for shock Continue heparin  as  bridge for procedures.    Acute on chronic biventricular failure Possible Sarcoidosis HF team following L/RHC yesterday, formal report pending but by verbal report continued low output and elevated filling pressures.  Milrinone  per HF team.  Coox 70% this am.  Lasix  gtt on-going.   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   Steroids initiated to treated for suspected Sarcoid. Will continue to discuss timing of PET, if can be inpatient or more likely outpatient.   Patient is critically ill and in danger of multiple system organ failure.     For questions or updates, please contact [facility] Please consult [email or web address] for contact info under     Signed, [name], PA-C  12/16/2023, 9:12 AM

## 2023-12-16 NOTE — TOC Initial Note (Signed)
 Transition of Care [facility]) - Initial/Assessment Note    Patient Details  Name: Harold Park MRN: [id number] Date of Birth: Nov 07, 1965  Transition of Care [facility]) CM/SW Contact:    Justina Delcia Czar, RN Phone Number: [phone] 12/16/2023, 7:09 AM  Clinical Narrative:                  Spoke to pt and was independent pta. Drives to appts. Has CPAP and scale at home. Will continue to follow for dc needs.   Expected Discharge Plan: Home/Self Care Barriers to Discharge: Continued Medical Work up   Patient Goals and CMS Choice Patient states their goals for this hospitalization and ongoing recovery are:: wants to recover          Expected Discharge Plan and Services   Discharge Planning Services: CM Consult   Living arrangements for the past 2 months: Single Family Home                                      Prior Living Arrangements/Services Living arrangements for the past 2 months: Single Family Home Lives with:: Spouse, Parents Patient language and need for interpreter reviewed:: Yes Do you feel safe going back to the place where you live?: Yes      Need for Family Participation in Patient Care: No (Comment) Care giver support system in place?: No (comment) Current home services: DME (CPAP) Criminal Activity/Legal Involvement Pertinent to Current Situation/Hospitalization: No - Comment as needed  Activities of Daily Living   ADL Screening (condition at time of admission) Independently performs ADLs?: Yes (appropriate for developmental age) Is the patient deaf or have difficulty hearing?: No Does the patient have difficulty seeing, even when wearing glasses/contacts?: No Does the patient have difficulty concentrating, remembering, or making decisions?: No  Permission Sought/Granted Permission sought to share information with : Case Manager, PCP, Family Supports Permission granted to share information with : Yes, Verbal Permission Granted  Share  Information with NAME: Alijah Akram  Permission granted to share info w AGENCY: DME, PCP  Permission granted to share info w Relationship: wife  Permission granted to share info w Contact Information: [phone]  Emotional Assessment Appearance:: Appears stated age Attitude/Demeanor/Rapport: Engaged Affect (typically observed): Accepting Orientation: : Oriented to Self, Oriented to Place, Oriented to  Time, Oriented to Situation   Psych Involvement: No (comment)  Admission diagnosis:  Sinus tachycardia [R00.0] History of atrial flutter [Z86.79] Acute hypoxemic respiratory failure (HCC) [J96.01] Acute on chronic HFrEF (heart failure with reduced ejection fraction) (HCC) [I50.23] Acute on chronic congestive heart failure, unspecified heart failure type (HCC) [I50.9] Patient Active Problem List   Diagnosis Date Noted   AKI (acute kidney injury) 12/12/2023   CKD stage 3a, GFR 45-59 ml/min (HCC) 12/12/2023   Acute on chronic systolic CHF (congestive heart failure) (HCC) 12/11/2023   Type 2 diabetes mellitus with cardiac complication (HCC) 02/10/2023   Persistent atrial fibrillation (HCC) 01/20/2023   Hyperkalemia 10/27/2022   Hypotension after procedure 10/15/2022   Heart failure (HCC) 10/14/2022   Hypercoagulable state due to paroxysmal atrial fibrillation (HCC) 10/10/2022   Secondary polycythemia 03/22/2021   Low testosterone  in male 10/23/2020   Sarcoidosis 04/09/2020   Obesity, class 2 12/04/2015   Chronic systolic heart failure (HCC) 08/28/2015   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 [place] [address] Phone: [phone] Fax: [phone]  [place] [facility] [address] [place] [address] Phone: [phone] Fax: [phone]     Social Drivers of Health (SDOH) Social History: SDOH Screenings   Food Insecurity: No Food Insecurity (12/12/2023)  Housing: Low Risk  (12/12/2023)  Transportation Needs: No Transportation Needs (12/12/2023)  Utilities: Not At Risk (12/12/2023)  Depression (PHQ2-9): Medium Risk (03/06/2023)  Tobacco Use: Low Risk  (12/12/2023)   SDOH Interventions:     Readmission Risk Interventions     No data to display

## 2023-12-16 NOTE — Progress Notes (Signed)
   12/16/23 2033  BiPAP/CPAP/SIPAP  BiPAP/CPAP/SIPAP Pt Type Adult  BiPAP/CPAP/SIPAP Resmed  Reason BIPAP/CPAP not in use Other(comment) (pt states he will put his CPAP on himself)  Mask Type Nasal pillows  Dentures removed? Not applicable  EPAP 6 cmH2O  FiO2 (%) 28 %  Patient Home Machine Yes  Safety Check Completed by RT for Home Unit Yes, no issues noted  Patient Home Mask Yes  Patient Home Tubing Yes  Auto Titrate No  CPAP/SIPAP surface wiped down Yes  Device Plugged into RED Power Outlet Yes

## 2023-12-16 NOTE — Progress Notes (Signed)
 Advanced Heart Failure Rounding Note  Cardiologist: Wilbert Bihari, MD  Chief Complaint: A/C HF Subjective:   10/6 Started on Milirnone 0.125 for low output.  Developed VTwith shock x1. Sleeping at the time but described some chest burning. Lidocaine  added. K supplemented.  10/7 Cath - coronaries ok. Elevated filling pressures, CI 1.7. Milrinone  increased to 0.25 mcg. Started on pulse steroids for possible sarcoid  CO-OX 70% on Milrinone . Remains on Amio and Lidocaine .   Denies SOB. Having a hard time sleeping.    Objective:   Weight Range: 133.9 kg Body mass index is 35.01 kg/m.   Vital Signs:   Temp:  [96.9 F (36.1 C)-99.6 F (37.6 C)] 99.6 F (37.6 C) (10/08 0000) Pulse Rate:  [42-105] 80 (10/08 0630) Resp:  [16-29] 23 (10/08 0630) BP: (70-127)/(60-108) 88/72 (10/08 0630) SpO2:  [90 %-99 %] 96 % (10/08 0630) FiO2 (%):  [28 %] 28 % (10/07 2259) Weight:  [133.9 kg] 133.9 kg (10/08 0530) Last BM Date : 12/14/23  Weight change: Filed Weights   12/14/23 1800 12/15/23 0500 12/16/23 0530  Weight: (!) 136.3 kg (!) 136.4 kg 133.9 kg    Intake/Output:   Intake/Output Summary (Last 24 hours) at 12/16/2023 0655 Last data filed at 12/16/2023 0615 Gross per 24 hour  Intake 1721.13 ml  Output 2850 ml  Net -1128.87 ml     CVP 14-15  Physical Exam  General:   No resp difficulty Neck: JVP to jaw  Cor: Regular rate & rhythm.  Lungs: clear Abdomen: soft, nontender, nondistended.  Extremities: no  edema Neuro: alert & oriented x3     Telemetry  SR with PVCs.  EKG   N/A   Labs    CBC Recent Labs    12/15/23 0406 12/15/23 0831  WBC 11.7*  --   HGB 15.6 16.7  16.7  HCT 45.4 49.0  49.0  MCV 86.1  --   PLT 226  --    Basic Metabolic Panel Recent Labs    89/93/74 2251 12/15/23 0406 12/15/23 0831 12/15/23 1739  NA 126* 126* 131*  130* 123*  K 3.5 3.6 3.7  3.8 3.2*  CL 84* 91*  --  86*  CO2 22 24  --  21*  GLUCOSE 396* 252*  --  400*  BUN 29*  29*  --  28*  CREATININE 1.74* 1.79*  --  1.74*  CALCIUM  7.6* 8.1*  --  7.6*  MG 2.0 2.0  --   --    Liver Function Tests Recent Labs    12/14/23 2251  AST 61*  ALT 74*  ALKPHOS 52  BILITOT 1.0  PROT 5.6*  ALBUMIN  2.8*   No results for input(s): LIPASE, AMYLASE in the last 72 hours. Cardiac Enzymes No results for input(s): CKTOTAL, CKMB, CKMBINDEX, TROPONINI in the last 72 hours.  BNP: BNP (last 3 results) Recent Labs    01/13/23 1126 08/06/23 1522  BNP 70.0 36.5    ProBNP (last 3 results) Recent Labs    12/11/23 1807  PROBNP 6,368.0*     D-Dimer No results for input(s): DDIMER in the last 72 hours. Hemoglobin A1C Recent Labs    12/14/23 0738  HGBA1C 5.5   Fasting Lipid Panel No results for input(s): CHOL, HDL, LDLCALC, TRIG, CHOLHDL, LDLDIRECT in the last 72 hours. Thyroid  Function Tests No results for input(s): TSH, T4TOTAL, T3FREE, THYROIDAB in the last 72 hours.  Invalid input(s): FREET3  Other results:   Imaging    No  results found.   Medications:     Scheduled Medications:  Chlorhexidine  Gluconate Cloth  6 each Topical Daily   digoxin   0.0625 mg Oral Daily   DULoxetine   20 mg Oral Daily   insulin  aspart  0-15 Units Subcutaneous TID WC   lidocaine   100 mg Intravenous Once   sodium chloride  flush  10-40 mL Intracatheter Q12H   sodium chloride  flush  10-40 mL Intracatheter Q12H   sodium chloride  flush  3 mL Intravenous Q12H   spironolactone   25 mg Oral Daily   tamsulosin   0.4 mg Oral QPC supper    Infusions:  amiodarone  60 mg/hr (12/16/23 0615)   furosemide  (LASIX ) 200 mg in dextrose  5 % 100 mL (2 mg/mL) infusion 30 mg/hr (12/16/23 0615)   heparin  1,450 Units/hr (12/16/23 0615)   lidocaine  1 mg/min (12/16/23 0615)   methylPREDNISolone  (SOLU-MEDROL ) injection Stopped (12/15/23 1726)   milrinone  0.25 mcg/kg/min (12/16/23 0615)    PRN Medications: acetaminophen  **OR** acetaminophen , oxyCODONE ,  prochlorperazine, senna, sodium chloride  flush, sodium chloride  flush    Patient Profile   58 year old with a history of HFrEF, PAF, OSA, VT, CKD Stage IIIa.   HF following for cardiogenic shock.   Assessment/Plan   A/C HFrEF, Cardiogenic Shock  CT coronary 03/06/20. Calcium  score 0. Poor arterial imaging but no obvious CAD - CMRI 2017 LVEF 20%. No LGE - Cardiac PET 2023- no evidence for cardiac sarcoid or inflammatory. LVEF 38%. - Echo 5/25 EF 30-35% -cMRI 2/25 EF 28 % RV 33% LGE pattern concerning for sarcoid  - Echo 12/12/23 EF < 15 severe RV HK - CO-OX 49%. QX Calculate Fick CO 4 CI 1.2. Post cath Add milrinone  0.125 mcg.  - Dig level  0.9-->Cut back digoxin  0.0625  - CO-OX stable Continue milrinone  0.25 mcg.  - CVP 14-15. Continue to diurese. -GDMT limited by hypotension.    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 - underwent DC-CV on 12/10/23 - Maintaining SR. Continue IV amio.Of note he has previously had difficulty taking amio due to nausea.  - Continue heparin   -Consult EP for potential AFL ablation (versus AVN/pacer) + ICD this admit   VT -S/P DC-CV 10/4  -S/P DC- CV 10/6  -Continue amio drip + lidocaine  1. Check lidocaine  level.  -EP following.   Possible Sarcoid -PET? Consulted EP. -Starting Sarcoid Protocol. Giving pulse steroids x 3 days and starting immunosuppression.  -Check labs. At this point will hold off on vaccines.   CKD Stage IIIa  -Creatinine baseline ~ 1.6-1.8  OSA  -Continue CPAP  Hyponatremia      Length of Stay: 4  Kiril Hippe, NP  12/16/2023, 6:55 AM  Advanced Heart Failure Team Pager [phone] (M-F; 7a - 5p)  Please contact [facility] for night-coverage after hours (5p -7a ) and weekends on [email or web address]

## 2023-12-16 NOTE — Progress Notes (Signed)
 PHARMACY - ANTICOAGULATION CONSULT NOTE  Pharmacy Consult for heparin  Indication: atrial fibrillation  Allergies  Allergen Reactions   Bee Venom Anaphylaxis    Patient Measurements: Height: 6' 5 (195.6 cm) Weight: 133.9 kg (295 lb 3.1 oz) IBW/kg (Calculated) : 89.1 HEPARIN  DW (KG): 119  Vital Signs: Temp: 99.6 F (37.6 C) (10/08 0000) Temp Source: Oral (10/08 0000) BP: 97/74 (10/08 1100) Pulse Rate: 78 (10/08 1100)  Labs: Recent Labs    12/14/23 1322 12/14/23 2251 12/15/23 0406 12/15/23 0831 12/15/23 1739 12/15/23 2038 12/16/23 0512 12/16/23 0949  HGB  --    < > 15.6 16.7  16.7  --   --   --  14.4  HCT  --   --  45.4 49.0  49.0  --   --   --  41.9  PLT  --   --  226  --   --   --   --  198  APTT 96*  --  82*  --   --   --  88*  --   HEPARINUNFRC  --   --  0.53  --   --  0.33 0.44  --   CREATININE 1.76*   < > 1.79*  --  1.74*  --  1.86*  --    < > = values in this interval not displayed.    Estimated Creatinine Clearance: 64.7 mL/min (A) (by C-G formula based on SCr of 1.86 mg/dL (H)).   Medications:   Infusions:   amiodarone  60 mg/hr (12/16/23 1100)   furosemide  (LASIX ) 200 mg in dextrose  5 % 100 mL (2 mg/mL) infusion 30 mg/hr (12/16/23 1100)   heparin  1,450 Units/hr (12/16/23 1100)   lidocaine  1 mg/min (12/16/23 1100)   methylPREDNISolone  (SOLU-MEDROL ) injection Stopped (12/15/23 1726)   milrinone  0.25 mcg/kg/min (12/16/23 1100)    Assessment: 58 YOM admitted with atrial fibrillation with rapid ventricular response. Patient underwent DCCV on 12/10/23 prior to this admission. Patient was shocked on 12/12/23 PM, now in NSR. Patient on Eliquis  prior to hospitalization, last dose on 12/11/23. Plan to start heparin  infusion prior to possible procedures. Pharmacy consulted for heparin  dosing.   12/16/23 AM: Heparin  level 0.44 and aPTT 88, both therapeutic on heparin  1450 units/hr. No issues with infusion running or signs of bleeding noted. CBC stable (Hgb 14.4,  PLT 198).   Goal of Therapy:  Heparin  level 0.3-0.7 units/ml aPTT 66-102 Monitor platelets by anticoagulation protocol: Yes   Plan:  Continue heparin  infusion at 1450 units/hr Monitor heparin  level, CBC, and s/sx of bleeding daily  Thank you for allowing pharmacy to participate in this patient's care,  Morna Breach, PharmD PGY2 Cardiology Pharmacy Resident 12/16/2023 11:03 AM

## 2023-12-17 ENCOUNTER — Inpatient Hospital Stay (HOSPITAL_COMMUNITY): Admission: RE | Admit: 2023-12-17 | Source: Ambulatory Visit

## 2023-12-17 ENCOUNTER — Telehealth (HOSPITAL_COMMUNITY): Payer: Self-pay | Admitting: Pharmacy Technician

## 2023-12-17 ENCOUNTER — Other Ambulatory Visit (HOSPITAL_COMMUNITY): Payer: Self-pay

## 2023-12-17 DIAGNOSIS — I5023 Acute on chronic systolic (congestive) heart failure: Secondary | ICD-10-CM | POA: Diagnosis not present

## 2023-12-17 DIAGNOSIS — R570 Cardiogenic shock: Secondary | ICD-10-CM | POA: Diagnosis not present

## 2023-12-17 DIAGNOSIS — I472 Ventricular tachycardia, unspecified: Secondary | ICD-10-CM | POA: Diagnosis not present

## 2023-12-17 DIAGNOSIS — I4892 Unspecified atrial flutter: Secondary | ICD-10-CM | POA: Diagnosis not present

## 2023-12-17 LAB — BASIC METABOLIC PANEL WITH GFR
Anion gap: 17 — ABNORMAL HIGH (ref 5–15)
Anion gap: 18 — ABNORMAL HIGH (ref 5–15)
Anion gap: 16 — ABNORMAL HIGH (ref 5–15)
BUN: 44 mg/dL — ABNORMAL HIGH (ref 6–20)
BUN: 49 mg/dL — ABNORMAL HIGH (ref 6–20)
BUN: 54 mg/dL — ABNORMAL HIGH (ref 6–20)
CO2: 19 mmol/L — ABNORMAL LOW (ref 22–32)
CO2: 18 mmol/L — ABNORMAL LOW (ref 22–32)
CO2: 17 mmol/L — ABNORMAL LOW (ref 22–32)
Calcium: 8.2 mg/dL — ABNORMAL LOW (ref 8.9–10.3)
Calcium: 8.1 mg/dL — ABNORMAL LOW (ref 8.9–10.3)
Calcium: 8.7 mg/dL — ABNORMAL LOW (ref 8.9–10.3)
Chloride: 82 mmol/L — ABNORMAL LOW (ref 98–111)
Chloride: 80 mmol/L — ABNORMAL LOW (ref 98–111)
Chloride: 86 mmol/L — ABNORMAL LOW (ref 98–111)
Creatinine, Ser: 2.45 mg/dL — ABNORMAL HIGH (ref 0.61–1.24)
Creatinine, Ser: 2.77 mg/dL — ABNORMAL HIGH (ref 0.61–1.24)
Creatinine, Ser: 3.06 mg/dL — ABNORMAL HIGH (ref 0.61–1.24)
GFR, Estimated: 30 mL/min — ABNORMAL LOW (ref >60)
GFR, Estimated: 26 mL/min — ABNORMAL LOW (ref >60)
GFR, Estimated: 23 mL/min — ABNORMAL LOW (ref >60)
Glucose, Bld: 426 mg/dL — ABNORMAL HIGH (ref 70–99)
Glucose, Bld: 387 mg/dL — ABNORMAL HIGH (ref 70–99)
Glucose, Bld: 211 mg/dL — ABNORMAL HIGH (ref 70–99)
Potassium: 4.1 mmol/L (ref 3.5–5.1)
Potassium: 4.1 mmol/L (ref 3.5–5.1)
Potassium: 4.4 mmol/L (ref 3.5–5.1)
Sodium: 118 mmol/L — CL (ref 135–145)
Sodium: 116 mmol/L — CL (ref 135–145)
Sodium: 119 mmol/L — CL (ref 135–145)

## 2023-12-17 LAB — MAGNESIUM: Magnesium: 2.2 mg/dL (ref 1.7–2.4)

## 2023-12-17 LAB — GLUCOSE 6 PHOSPHATE DEHYDROGENASE
G6PDH: 11.1 U/g{Hb} (ref 5.5–14.2)
Hemoglobin: 12.7 g/dL — ABNORMAL LOW (ref 13.0–17.7)

## 2023-12-17 LAB — COOXEMETRY PANEL
Carboxyhemoglobin: 1.3 % (ref 0.5–1.5)
Carboxyhemoglobin: 2.1 % — ABNORMAL HIGH (ref 0.5–1.5)
Methemoglobin: 0.8 % (ref 0.0–1.5)
Methemoglobin: <0.7 % (ref 0.0–1.5)
O2 Saturation: 68 %
O2 Saturation: 63.4 %
Total hemoglobin: 15.3 g/dL (ref 12.0–16.0)
Total hemoglobin: 14.8 g/dL (ref 12.0–16.0)

## 2023-12-17 LAB — HEPARIN LEVEL (UNFRACTIONATED): Heparin Unfractionated: 0.43 [IU]/mL (ref 0.30–0.70)

## 2023-12-17 LAB — GLUCOSE, CAPILLARY
Glucose-Capillary: 221 mg/dL — ABNORMAL HIGH (ref 70–99)
Glucose-Capillary: 208 mg/dL — ABNORMAL HIGH (ref 70–99)
Glucose-Capillary: 204 mg/dL — ABNORMAL HIGH (ref 70–99)
Glucose-Capillary: 210 mg/dL — ABNORMAL HIGH (ref 70–99)
Glucose-Capillary: 207 mg/dL — ABNORMAL HIGH (ref 70–99)

## 2023-12-17 LAB — LIDOCAINE LEVEL: Lidocaine Lvl: 3.4

## 2023-12-17 LAB — MISC LABCORP TEST (SEND OUT): Labcorp test code: 83935

## 2023-12-17 LAB — HEPATITIS PANEL, ACUTE
HCV Ab: NONREACTIVE
Hep A IgM: NONREACTIVE
Hep B C IgM: NONREACTIVE
Hepatitis B Surface Ag: NONREACTIVE

## 2023-12-17 LAB — CG4 I-STAT (LACTIC ACID): Lactic Acid, Venous: 2.7 mmol/L (ref 0.5–1.9)

## 2023-12-17 MED ORDER — PREDNISONE 20 MG PO TABS
30.0000 mg | ORAL_TABLET | Freq: Every day | ORAL | Status: DC
  Administered 2023-12-18 – 2023-12-23 (×6): 30 mg via ORAL
  Filled 2023-12-17 (×7): qty 2

## 2023-12-17 MED ORDER — INSULIN GLARGINE 100 UNIT/ML ~~LOC~~ SOLN
20.0000 [IU] | Freq: Every day | SUBCUTANEOUS | Status: DC
  Administered 2023-12-17: 20 [IU] via SUBCUTANEOUS
  Filled 2023-12-17 (×2): qty 0.2

## 2023-12-17 MED ORDER — MEXILETINE HCL 150 MG PO CAPS
150.0000 mg | ORAL_CAPSULE | Freq: Three times a day (TID) | ORAL | Status: DC
  Administered 2023-12-17 – 2023-12-24 (×22): 150 mg via ORAL
  Filled 2023-12-17 (×22): qty 1

## 2023-12-17 NOTE — Plan of Care (Signed)
  Problem: Metabolic: Goal: Ability to maintain appropriate glucose levels will improve Outcome: Not Progressing   Problem: Education: Goal: Knowledge of General Education information will improve Description: Including pain rating scale, medication(s)/side effects and non-pharmacologic comfort measures Outcome: Progressing   Problem: Health Behavior/Discharge Planning: Goal: Ability to manage health-related needs will improve Outcome: Progressing   Problem: Coping: Goal: Level of anxiety will decrease Outcome: Progressing   Problem: Pain Managment: Goal: General experience of comfort will improve and/or be controlled Outcome: Progressing   Problem: Safety: Goal: Ability to remain free from injury will improve Outcome: Progressing

## 2023-12-17 NOTE — Plan of Care (Signed)
  Problem: Education: Goal: Knowledge of General Education information will improve Description: Including pain rating scale, medication(s)/side effects and non-pharmacologic comfort measures 12/17/2023 1344 by Isaias Claretta SAILOR, RN Outcome: Progressing 12/17/2023 1344 by Isaias Claretta SAILOR, RN Outcome: Progressing   Problem: Health Behavior/Discharge Planning: Goal: Ability to manage health-related needs will improve Outcome: Progressing   Problem: Clinical Measurements: Goal: Will remain free from infection Outcome: Progressing Goal: Respiratory complications will improve Outcome: Progressing   Problem: Safety: Goal: Ability to remain free from injury will improve Outcome: Progressing   Problem: Skin Integrity: Goal: Risk for impaired skin integrity will decrease Outcome: Progressing

## 2023-12-17 NOTE — Telephone Encounter (Signed)
 Patient Product/process development scientist completed.    The patient is insured through [facility]. Patient has [employer], may use a copay card, and/or apply for patient assistance if available.    Ran test claim for Dapsone 100 mg and the current 30 day co-pay is $0.00.   This test claim was processed through [facility]- copay amounts may vary at other pharmacies due to pharmacy/plan contracts, or as the patient moves through the different stages of their insurance plan.     Reyes Sharps, CPHT Pharmacy Technician Patient Advocate Specialist Lead [facility] Patient Advocate Team Direct Number: [phone]  Fax: [phone]

## 2023-12-17 NOTE — Progress Notes (Signed)
 Advanced Heart Failure Rounding Note  Cardiologist: Wilbert Bihari, MD  Chief Complaint: A/C HF Subjective:   10/6 Started on Milirnone 0.125 for low output.  Developed VTwith shock x1. Sleeping at the time but described some chest burning. Lidocaine  added. K supplemented.  10/7 Cath - coronaries ok. Elevated filling pressures, CI 1.7. Milrinone  increased to 0.25 mcg. Started on pulse steroids for possible sarcoid  CO-OX 68% on Milrinone  0.25. Remains on Amio @60  and Lidocaine  @ 0.5.   CVP 13, lasix  gtt stopped yesterday  Na 118, corrected to 126.   SCr 2.18>2.45  Sitting up in the chair. Denies CP/SOB. Tired of being in bed and wants to walk around.   Objective:   Weight Range: 134.8 kg Body mass index is 35.24 kg/m.   Vital Signs:   Temp:  [97.5 F (36.4 C)-98.9 F (37.2 C)] 98.9 F (37.2 C) (10/09 0330) Pulse Rate:  [78-88] 86 (10/09 0805) Resp:  [14-27] 24 (10/09 0805) BP: (75-112)/(51-76) 77/59 (10/09 0805) SpO2:  [91 %-97 %] 94 % (10/09 0805) FiO2 (%):  [28 %] 28 % (10/08 2033) Weight:  [134.8 kg] 134.8 kg (10/09 0500) Last BM Date : 12/14/23  Weight change: Filed Weights   12/15/23 0500 12/16/23 0530 12/17/23 0500  Weight: (!) 136.4 kg 133.9 kg 134.8 kg    Intake/Output:   Intake/Output Summary (Last 24 hours) at 12/17/2023 0810 Last data filed at 12/17/2023 0800 Gross per 24 hour  Intake 1930.8 ml  Output 1350 ml  Net 580.8 ml     CVP 13 Physical Exam  General:  chronically ill appearing.  No respiratory difficulty Neck: JVD ~12 cm.  Cor: Regular rate & rhythm. No murmurs. Lungs: clear Extremities: trace BLE edema  Neuro: alert & oriented x 3. Affect pleasant.   Telemetry  NSR 80s, 2-5 PVCs (Personally reviewed)    Labs    CBC Recent Labs    12/15/23 0406 12/15/23 0831 12/16/23 0949  WBC 11.7*  --  11.9*  NEUTROABS  --   --  11.1*  HGB 15.6 16.7  16.7 14.4  HCT 45.4 49.0  49.0 41.9  MCV 86.1  --  85.3  PLT 226  --  198    Basic Metabolic Panel Recent Labs    89/91/74 0512 12/16/23 2124 12/17/23 0459  NA 119* 119* 118*  K 3.6 4.2 4.1  CL 84* 82* 82*  CO2 20* 19* 19*  GLUCOSE 388* 369* 426*  BUN 34* 40* 44*  CREATININE 1.86* 2.18* 2.45*  CALCIUM  8.2* 8.2* 8.2*  MG 2.2  --  2.2   Liver Function Tests Recent Labs    12/14/23 2251 12/16/23 0949  AST 61* 36  ALT 74* 55*  ALKPHOS 52 49  BILITOT 1.0 0.8  PROT 5.6* 5.5*  ALBUMIN  2.8* 2.6*   No results for input(s): LIPASE, AMYLASE in the last 72 hours. Cardiac Enzymes No results for input(s): CKTOTAL, CKMB, CKMBINDEX, TROPONINI in the last 72 hours.  BNP: BNP (last 3 results) Recent Labs    01/13/23 1126 08/06/23 1522  BNP 70.0 36.5    ProBNP (last 3 results) Recent Labs    12/11/23 1807  PROBNP 6,368.0*     D-Dimer No results for input(s): DDIMER in the last 72 hours. Hemoglobin A1C No results for input(s): HGBA1C in the last 72 hours.  Fasting Lipid Panel No results for input(s): CHOL, HDL, LDLCALC, TRIG, CHOLHDL, LDLDIRECT in the last 72 hours. Thyroid  Function Tests No results for input(s): TSH,  T4TOTAL, T3FREE, THYROIDAB in the last 72 hours.  Invalid input(s): FREET3  Other results:   Imaging    No results found.   Medications:     Scheduled Medications:  calcium  citrate  200 mg of elemental calcium  Oral Daily   Chlorhexidine  Gluconate Cloth  6 each Topical Daily   cholecalciferol  1,000 Units Oral Daily   digoxin   0.0625 mg Oral Daily   DULoxetine   20 mg Oral Daily   insulin  aspart  0-20 Units Subcutaneous TID WC   insulin  aspart  0-5 Units Subcutaneous QHS   insulin  glargine  20 Units Subcutaneous Daily   mycophenolate  500 mg Oral BID   pantoprazole  40 mg Oral Daily   sodium chloride  flush  10-40 mL Intracatheter Q12H   sodium chloride  flush  10-40 mL Intracatheter Q12H   sodium chloride  flush  3 mL Intravenous Q12H   spironolactone   25 mg Oral Daily    tamsulosin   0.4 mg Oral QPC supper    Infusions:  amiodarone  60 mg/hr (12/17/23 0800)   heparin  1,450 Units/hr (12/17/23 0800)   lidocaine  0.5 mg/min (12/17/23 0800)   methylPREDNISolone  (SOLU-MEDROL ) injection Stopped (12/16/23 1806)   milrinone  0.25 mcg/kg/min (12/17/23 0800)    PRN Medications: acetaminophen  **OR** acetaminophen , oxyCODONE , prochlorperazine, senna, sodium chloride  flush, sodium chloride  flush Patient Profile  58 year old with a history of HFrEF, PAF, OSA, VT, CKD Stage IIIa.   HF following for cardiogenic shock.  Assessment/Plan   A/C HFrEF, Cardiogenic Shock  CT coronary 03/06/20. Calcium  score 0. Poor arterial imaging but no obvious CAD - CMRI 2017 LVEF 20%. No LGE - Cardiac PET 2023- no evidence for cardiac sarcoid or inflammatory. LVEF 38%. - Echo 5/25 EF 30-35% -cMRI 2/25 EF 28 % RV 33% LGE pattern concerning for sarcoid  - Echo 12/12/23 EF < 15 severe RV HK - CO-OX 68%. QX Calculate Fick CO 6.7 CI 2.47. Continue milrinone  0.25 mcg.  - CVP 13. Hold diuresis. Check BMET this afternoon.  - GDMT limited by hypotension.  - Hold spiro and digoxin  with elevated SCr. 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 - underwent DC-CV on 12/10/23 - Maintaining SR. Continue IV amio. Of note he has previously had difficulty taking amio due to nausea.  - Continue heparin   - Consult EP for potential AFL ablation (versus AVN/pacer) + ICD this admit   VT -S/P DC-CV 10/4  -S/P DC- CV 10/6  -Continue amio drip + lidocaine  0.5. Check lidocaine  level.  -EP following.   Possible Sarcoid -PET? Consulted EP. -Now on Sarcoid Protocol. Giving pulse steroids x 3 days and starting immunosuppression.  -Check labs. At this point will hold off on vaccines.   AKI on CKD Stage IIIa  -Creatinine baseline ~ 1.6-1.8 - SCr up to 2.45 today - Avoid hypotension - Holding further diuretics  OSA  -Continue CPAP  Pseudohyponatremia  - Corrected Na 126 - Follow  CRITICAL  CARE Performed by: Beckey LITTIE Coe   Total critical care time: 14 minutes  Critical care time was exclusive of separately billable procedures and treating other patients.  Critical care was necessary to treat or prevent imminent or life-threatening deterioration.  Critical care was time spent personally by me on the following activities: development of treatment plan with patient and/or surrogate as well as nursing, discussions with consultants, evaluation of patient's response to treatment, examination of patient, obtaining history from patient or surrogate, ordering and performing treatments and interventions, ordering and  review of laboratory studies, ordering and review of radiographic studies, pulse oximetry and re-evaluation of patient's condition.     Length of Stay: 5  Beckey LITTIE Coe, NP  12/17/2023, 8:10 AM  Advanced Heart Failure Team Pager [phone] (M-F; 7a - 5p)  Please contact [facility] for night-coverage after hours (5p -7a ) and weekends on [email or web address]

## 2023-12-17 NOTE — Progress Notes (Signed)
  Patient Name: Harold Park Date of Encounter: 12/17/2023  Primary Cardiologist: Wilbert Bihari, MD Electrophysiologist: Eulas FORBES Furbish, MD  Interval Summary   Coox 68% on Milrinone  0.25  Remains fatigued. Denies CP or SOB  Vital Signs    Vitals:   12/17/23 0700 12/17/23 0704 12/17/23 0805 12/17/23 0837  BP:  (!) 86/68 (!) 77/59 (!) 148/128  Pulse: 86 87 86 88  Resp: 19 (!) 24 (!) 24 (!) 26  Temp:      TempSrc:      SpO2: 94% 97% 94% 97%  Weight:      Height:        Intake/Output Summary (Last 24 hours) at 12/17/2023 0843 Last data filed at 12/17/2023 0800 Gross per 24 hour  Intake 1930.8 ml  Output 1350 ml  Net 580.8 ml   Filed Weights   12/15/23 0500 12/16/23 0530 12/17/23 0500  Weight: (!) 136.4 kg 133.9 kg 134.8 kg    Physical Exam    GEN- NAD, Alert and oriented  Lungs- Clear to ausculation bilaterally, normal work of breathing Cardiac- Regular rate and rhythm, no murmurs, rubs or gallops GI- soft, NT, ND, + BS Extremities- no clubbing or cyanosis. No edema  Telemetry    NSR 80a, occasional PVCs (personally reviewed)  Hospital Course    [name] is a 58 y.o. male with a history of persistent AF, Typical and atypical AFL, Chronic biventricular failure, and Obesity who is being seen today for the evaluation of recurrent and poorly tolerated atrial arrhythmias and VT at the request of Dr. Zenaida.   Assessment & Plan    VT In setting of cardiogenic shock Will need CRT-D prior to discharge pending course Remains quiescent.  Continue IV amiodarone  while on inotropes.  Stop lidocaine . Start Mexitil.  No driving x 6 months.    Paroxysmal AF Paroxysmal AFL, typical and atypical Tolerates very poorly. Had planned for ablation then ICD, but given recurrence will likely require AV nodal ablation and CRT-D given VT this admission and need for shock Continue heparin  as bridge for procedures.    Acute on chronic biventricular failure Possible  Sarcoidosis HF team following [facility] 10/7 with continued low output and elevated filling pressures.  Milrinone  per HF team.  Coox 68% this am.  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   Dr. Furbish has seen.    For questions or updates, please contact [facility] Please consult [email or web address] for contact info under     Signed, [name], PA-C  12/17/2023, 8:43 AM

## 2023-12-17 NOTE — Progress Notes (Signed)
 PHARMACY - ANTICOAGULATION CONSULT NOTE  Pharmacy Consult for heparin  Indication: atrial fibrillation  Allergies  Allergen Reactions   Bee Venom Anaphylaxis    Patient Measurements: Height: 6' 5 (195.6 cm) Weight: 134.8 kg (297 lb 2.9 oz) IBW/kg (Calculated) : 89.1 HEPARIN  DW (KG): 119  Vital Signs: Temp: 98.9 F (37.2 C) (10/09 0330) Temp Source: Axillary (10/09 0330) BP: 148/128 (10/09 0837) Pulse Rate: 88 (10/09 0837)  Labs: Recent Labs    12/14/23 1322 12/14/23 2251 12/15/23 0406 12/15/23 0831 12/15/23 1739 12/15/23 2038 12/16/23 0512 12/16/23 0949 12/16/23 2124 12/17/23 0459  HGB  --    < > 15.6 16.7  16.7  --   --   --  14.4  --   --   HCT  --   --  45.4 49.0  49.0  --   --   --  41.9  --   --   PLT  --   --  226  --   --   --   --  198  --   --   APTT 96*  --  82*  --   --   --  88*  --   --   --   HEPARINUNFRC  --    < > 0.53  --   --  0.33 0.44  --   --  0.43  CREATININE 1.76*   < > 1.79*  --    < >  --  1.86*  --  2.18* 2.45*   < > = values in this interval not displayed.    Estimated Creatinine Clearance: 49.3 mL/min (A) (by C-G formula based on SCr of 2.45 mg/dL (H)).   Medications:   Infusions:   amiodarone  60 mg/hr (12/17/23 0800)   heparin  1,450 Units/hr (12/17/23 0800)   methylPREDNISolone  (SOLU-MEDROL ) injection Stopped (12/16/23 1806)   milrinone  0.25 mcg/kg/min (12/17/23 0800)    Assessment: 58 YOM admitted with atrial fibrillation with rapid ventricular response. Patient underwent DCCV on 12/10/23 prior to this admission. Patient was shocked on 12/12/23 PM, now in NSR. Patient on Eliquis  prior to hospitalization, last dose on 12/11/23. Plan to start heparin  infusion prior to possible procedures. Pharmacy consulted for heparin  dosing.   12/17/23 AM: Heparin  level 0.43, therapeutic on heparin  1450 units/hr. No issues with infusion running or signs of bleeding noted. CBC stable (Hgb 14.4, PLT 198).   Goal of Therapy:  Heparin  level  0.3-0.7 units/ml aPTT 66-102 Monitor platelets by anticoagulation protocol: Yes   Plan:  Continue heparin  infusion at 1450 units/hr Monitor heparin  level, CBC, and s/sx of bleeding daily  Thank you for allowing pharmacy to participate in this patient's care,  Morna Breach, PharmD PGY2 Cardiology Pharmacy Resident 12/17/2023 8:58 AM

## 2023-12-18 ENCOUNTER — Encounter (HOSPITAL_COMMUNITY): Payer: Self-pay | Admitting: Cardiology

## 2023-12-18 DIAGNOSIS — I472 Ventricular tachycardia, unspecified: Secondary | ICD-10-CM | POA: Diagnosis not present

## 2023-12-18 DIAGNOSIS — I4892 Unspecified atrial flutter: Secondary | ICD-10-CM | POA: Diagnosis not present

## 2023-12-18 DIAGNOSIS — R570 Cardiogenic shock: Secondary | ICD-10-CM | POA: Diagnosis not present

## 2023-12-18 DIAGNOSIS — I5023 Acute on chronic systolic (congestive) heart failure: Secondary | ICD-10-CM | POA: Diagnosis not present

## 2023-12-18 LAB — URINALYSIS, ROUTINE W REFLEX MICROSCOPIC
Bilirubin Urine: NEGATIVE
Glucose, UA: NEGATIVE mg/dL
Hgb urine dipstick: NEGATIVE
Ketones, ur: NEGATIVE mg/dL
Leukocytes,Ua: NEGATIVE
Nitrite: NEGATIVE
Protein, ur: NEGATIVE mg/dL
Specific Gravity, Urine: 1.014 (ref 1.005–1.030)
pH: 5 (ref 5.0–8.0)

## 2023-12-18 LAB — COOXEMETRY PANEL
Carboxyhemoglobin: 1.3 % (ref 0.5–1.5)
Carboxyhemoglobin: 2.5 % — ABNORMAL HIGH (ref 0.5–1.5)
Methemoglobin: <0.7 % (ref 0.0–1.5)
Methemoglobin: 0.7 % (ref 0.0–1.5)
O2 Saturation: 75.9 %
O2 Saturation: 87.1 %
Total hemoglobin: 14.7 g/dL (ref 12.0–16.0)
Total hemoglobin: 14.2 g/dL (ref 12.0–16.0)

## 2023-12-18 LAB — BASIC METABOLIC PANEL WITH GFR
Anion gap: 20 — ABNORMAL HIGH (ref 5–15)
Anion gap: 17 — ABNORMAL HIGH (ref 5–15)
BUN: 58 mg/dL — ABNORMAL HIGH (ref 6–20)
BUN: 69 mg/dL — ABNORMAL HIGH (ref 6–20)
CO2: 17 mmol/L — ABNORMAL LOW (ref 22–32)
CO2: 15 mmol/L — ABNORMAL LOW (ref 22–32)
Calcium: 7.8 mg/dL — ABNORMAL LOW (ref 8.9–10.3)
Calcium: 8.7 mg/dL — ABNORMAL LOW (ref 8.9–10.3)
Chloride: 76 mmol/L — ABNORMAL LOW (ref 98–111)
Chloride: 86 mmol/L — ABNORMAL LOW (ref 98–111)
Creatinine, Ser: 3.25 mg/dL — ABNORMAL HIGH (ref 0.61–1.24)
Creatinine, Ser: 3.68 mg/dL — ABNORMAL HIGH (ref 0.61–1.24)
GFR, Estimated: 21 mL/min — ABNORMAL LOW (ref >60)
GFR, Estimated: 18 mL/min — ABNORMAL LOW (ref >60)
Glucose, Bld: 489 mg/dL — ABNORMAL HIGH (ref 70–99)
Glucose, Bld: 229 mg/dL — ABNORMAL HIGH (ref 70–99)
Potassium: 4 mmol/L (ref 3.5–5.1)
Potassium: 4.7 mmol/L (ref 3.5–5.1)
Sodium: 113 mmol/L — CL (ref 135–145)
Sodium: 118 mmol/L — CL (ref 135–145)

## 2023-12-18 LAB — DIGOXIN LEVEL: Digoxin Level: 0.9 ng/mL (ref 0.8–2.0)

## 2023-12-18 LAB — GLUCOSE, CAPILLARY
Glucose-Capillary: 203 mg/dL — ABNORMAL HIGH (ref 70–99)
Glucose-Capillary: 230 mg/dL — ABNORMAL HIGH (ref 70–99)
Glucose-Capillary: 354 mg/dL — ABNORMAL HIGH (ref 70–99)
Glucose-Capillary: 195 mg/dL — ABNORMAL HIGH (ref 70–99)
Glucose-Capillary: 209 mg/dL — ABNORMAL HIGH (ref 70–99)
Glucose-Capillary: 225 mg/dL — ABNORMAL HIGH (ref 70–99)

## 2023-12-18 LAB — CBC
HCT: 40.5 % (ref 39.0–52.0)
Hemoglobin: 14.4 g/dL (ref 13.0–17.0)
MCH: 29.4 pg (ref 26.0–34.0)
MCHC: 35.6 g/dL (ref 30.0–36.0)
MCV: 82.8 fL (ref 80.0–100.0)
Platelets: 183 K/uL (ref 150–400)
RBC: 4.89 MIL/uL (ref 4.22–5.81)
RDW: 13.8 % (ref 11.5–15.5)
WBC: 16.5 K/uL — ABNORMAL HIGH (ref 4.0–10.5)
nRBC: 1.1 % — ABNORMAL HIGH (ref 0.0–0.2)

## 2023-12-18 LAB — OSMOLALITY, URINE: Osmolality, Ur: 313 mosm/kg (ref 300–900)

## 2023-12-18 LAB — OSMOLALITY: Osmolality: 274 mosm/kg — ABNORMAL LOW (ref 275–295)

## 2023-12-18 LAB — MAGNESIUM: Magnesium: 2.1 mg/dL (ref 1.7–2.4)

## 2023-12-18 LAB — CG4 I-STAT (LACTIC ACID): Lactic Acid, Venous: 1.8 mmol/L (ref 0.5–1.9)

## 2023-12-18 LAB — HEPARIN LEVEL (UNFRACTIONATED): Heparin Unfractionated: 0.49 [IU]/mL (ref 0.30–0.70)

## 2023-12-18 LAB — SODIUM, URINE, RANDOM: Sodium, Ur: <30 mmol/L

## 2023-12-18 LAB — CREATININE, URINE, RANDOM: Creatinine, Urine: 97 mg/dL

## 2023-12-18 MED ORDER — POLYETHYLENE GLYCOL 3350 17 G PO PACK
17.0000 g | PACK | Freq: Two times a day (BID) | ORAL | Status: DC
  Administered 2023-12-18 – 2023-12-23 (×11): 17 g via ORAL
  Filled 2023-12-18 (×11): qty 1

## 2023-12-18 MED ORDER — METHOTREXATE SODIUM 2.5 MG PO TABS
5.0000 mg | ORAL_TABLET | ORAL | Status: DC
  Administered 2023-12-19: 5 mg via ORAL
  Filled 2023-12-18 (×2): qty 2

## 2023-12-18 MED ORDER — FUROSEMIDE 10 MG/ML IJ SOLN
160.0000 mg | Freq: Once | INTRAVENOUS | Status: AC
  Administered 2023-12-18: 160 mg via INTRAVENOUS
  Filled 2023-12-18: qty 16

## 2023-12-18 MED ORDER — NOREPINEPHRINE 4 MG/250ML-% IV SOLN: 0.0000 ug/min | INTRAVENOUS | Status: DC

## 2023-12-18 MED ORDER — INSULIN ASPART 100 UNIT/ML IJ SOLN
4.0000 [IU] | Freq: Three times a day (TID) | INTRAMUSCULAR | Status: DC
  Administered 2023-12-19 – 2023-12-20 (×6): 4 [IU] via SUBCUTANEOUS

## 2023-12-18 MED ORDER — SODIUM CHLORIDE 0.9 % IV SOLN: INTRAVENOUS | Status: AC | PRN

## 2023-12-18 MED ORDER — FUROSEMIDE 10 MG/ML IJ SOLN
40.0000 mg/h | INTRAVENOUS | Status: DC
  Administered 2023-12-18 – 2023-12-23 (×22): 40 mg/h via INTRAVENOUS
  Filled 2023-12-18 (×24): qty 20

## 2023-12-18 MED ORDER — NOREPINEPHRINE 16 MG/250ML-% IV SOLN
0.0000 ug/min | INTRAVENOUS | Status: DC
  Administered 2023-12-18: 11 ug/min via INTRAVENOUS
  Administered 2023-12-19: 17 ug/min via INTRAVENOUS
  Administered 2023-12-19: 16 ug/min via INTRAVENOUS
  Administered 2023-12-20: 9 ug/min via INTRAVENOUS
  Administered 2023-12-21: 20 ug/min via INTRAVENOUS
  Administered 2023-12-22: 26 ug/min via INTRAVENOUS
  Administered 2023-12-22: 29 ug/min via INTRAVENOUS
  Administered 2023-12-23: 30 ug/min via INTRAVENOUS
  Administered 2023-12-23: 39 ug/min via INTRAVENOUS
  Administered 2023-12-23: 30 ug/min via INTRAVENOUS
  Administered 2023-12-24: 40 ug/min via INTRAVENOUS
  Administered 2023-12-24: 41 ug/min via INTRAVENOUS
  Administered 2023-12-24: 42 ug/min via INTRAVENOUS
  Administered 2023-12-25: 48 ug/min via INTRAVENOUS
  Administered 2023-12-25: 34 ug/min via INTRAVENOUS
  Administered 2023-12-25: 50 ug/min via INTRAVENOUS
  Administered 2023-12-25: 55 ug/min via INTRAVENOUS
  Administered 2023-12-25: 50 ug/min via INTRAVENOUS
  Administered 2023-12-26: 45 ug/min via INTRAVENOUS
  Administered 2023-12-26: 38 ug/min via INTRAVENOUS
  Administered 2023-12-27 (×2): 50 ug/min via INTRAVENOUS
  Administered 2023-12-28: 51 ug/min via INTRAVENOUS
  Administered 2023-12-28: 46 ug/min via INTRAVENOUS
  Administered 2023-12-28 (×2): 51 ug/min via INTRAVENOUS
  Administered 2023-12-29 (×3): 60 ug/min via INTRAVENOUS
  Filled 2023-12-18 (×22): qty 250
  Filled 2023-12-18: qty 500
  Filled 2023-12-18 (×3): qty 250
  Filled 2023-12-18: qty 500
  Filled 2023-12-18 (×7): qty 250

## 2023-12-18 MED ORDER — DAPSONE 100 MG PO TABS
100.0000 mg | ORAL_TABLET | Freq: Every day | ORAL | Status: DC
  Administered 2023-12-18 – 2023-12-23 (×6): 100 mg via ORAL
  Filled 2023-12-18 (×7): qty 1

## 2023-12-18 MED ORDER — NOREPINEPHRINE 4 MG/250ML-% IV SOLN
0.0000 ug/min | INTRAVENOUS | Status: DC
  Administered 2023-12-18: 2 ug/min via INTRAVENOUS
  Filled 2023-12-18: qty 250

## 2023-12-18 MED ORDER — INSULIN GLARGINE 100 UNIT/ML ~~LOC~~ SOLN
25.0000 [IU] | Freq: Every day | SUBCUTANEOUS | Status: DC
  Administered 2023-12-18 – 2023-12-22 (×5): 25 [IU] via SUBCUTANEOUS
  Filled 2023-12-18 (×6): qty 0.25

## 2023-12-18 MED ORDER — SODIUM CHLORIDE 3 % IV BOLUS
150.0000 mL | Freq: Three times a day (TID) | INTRAVENOUS | Status: AC
  Administered 2023-12-18 (×2): 150 mL via INTRAVENOUS
  Filled 2023-12-18 (×2): qty 500

## 2023-12-18 MED ORDER — SODIUM CHLORIDE 3% (HYPERTONIC SALINE) BOLUS VIA INFUSION
150.0000 mL | Freq: Two times a day (BID) | INTRAVENOUS | Status: DC
  Filled 2023-12-18 (×2): qty 150

## 2023-12-18 MED ORDER — VASOPRESSIN 20 UNITS/100 ML INFUSION FOR SHOCK
0.0400 [IU]/min | INTRAVENOUS | Status: DC
  Administered 2023-12-18 – 2023-12-29 (×30): 0.04 [IU]/min via INTRAVENOUS
  Filled 2023-12-18 (×30): qty 100
  Filled 2023-12-18: qty 200
  Filled 2023-12-18: qty 100

## 2023-12-18 MED ORDER — SENNA 8.6 MG PO TABS
2.0000 | ORAL_TABLET | Freq: Every day | ORAL | Status: DC
  Administered 2023-12-18 – 2023-12-23 (×6): 17.2 mg via ORAL
  Filled 2023-12-18 (×6): qty 2

## 2023-12-18 NOTE — Progress Notes (Signed)
 PHARMACY - ANTICOAGULATION CONSULT NOTE  Pharmacy Consult for heparin  Indication: atrial fibrillation  Allergies  Allergen Reactions   Bee Venom Anaphylaxis    Patient Measurements: Height: 6' 5 (195.6 cm) Weight: (!) 136.7 kg (301 lb 5.9 oz) IBW/kg (Calculated) : 89.1 HEPARIN  DW (KG): 119  Vital Signs: Temp: 97 F (36.1 C) (10/10 0700) Temp Source: Axillary (10/10 0700) BP: 94/56 (10/10 0700) Pulse Rate: 81 (10/10 0700)  Labs: Recent Labs    12/15/23 0831 12/15/23 1739 12/16/23 0512 12/16/23 0949 12/16/23 2124 12/17/23 0459 12/17/23 1256 12/17/23 1556 12/18/23 0506  HGB 16.7  16.7  --   --  14.4  12.7*  --   --   --   --  14.4  HCT 49.0  49.0  --   --  41.9  --   --   --   --  40.5  PLT  --   --   --  198  --   --   --   --  183  APTT  --   --  88*  --   --   --   --   --   --   HEPARINUNFRC  --    < > 0.44  --   --  0.43  --   --  0.49  CREATININE  --    < > 1.86*  --    < > 2.45* 2.77* 3.06* 3.25*   < > = values in this interval not displayed.    Estimated Creatinine Clearance: 37.4 mL/min (A) (by C-G formula based on SCr of 3.25 mg/dL (H)).   Medications:   Infusions:   amiodarone  30 mg/hr (12/18/23 0751)   heparin  1,450 Units/hr (12/18/23 0751)   milrinone  0.25 mcg/kg/min (12/18/23 0751)    Assessment: 58 YOM admitted with atrial fibrillation with rapid ventricular response. Patient underwent DCCV on 12/10/23 prior to this admission. Patient was shocked on 12/12/23 PM, now in NSR. Patient on Eliquis  prior to hospitalization, last dose on 12/11/23. Plan to start heparin  infusion prior to possible procedures. Pharmacy consulted for heparin  dosing.   12/18/23 AM: Heparin  level 0.49, therapeutic on heparin  1450 units/hr. No issues with infusion running or signs of bleeding noted. CBC stable (Hgb 14.4, PLT 183).   Goal of Therapy:  Heparin  level 0.3-0.7 units/ml aPTT 66-102 Monitor platelets by anticoagulation protocol: Yes   Plan:  Continue heparin   infusion at 1450 units/hr Monitor heparin  level, CBC, and s/sx of bleeding daily  Thank you for allowing pharmacy to participate in this patient's care,  Morna Breach, PharmD PGY2 Cardiology Pharmacy Resident 12/18/2023 8:05 AM

## 2023-12-18 NOTE — Progress Notes (Signed)
 Patient with worsening tremor, not at rest. Discussed with PharmD, Dr. Zenaida and EP. On multiple agents that can cause tremors (meds in order of risk of tremors: duloxetine >amiodarone >mexiletine>mycophenolate).   Decision made to stop mycophenolate and transition to methotrexate, will renally dose.   Beckey LITTIE Coe AGACNP-BC Advanced Heart Failure Team  12/18/23

## 2023-12-18 NOTE — Procedures (Signed)
 Arterial Line Insertion Start/End10/12/2023 8:27 AM, 12/18/2023 9:10 AM  Patient location: ICU. Preanesthetic checklist: patient identified, site marked, risks and benefits discussed, surgical consent, monitors and equipment checked and timeout performed Left, radial was placed Catheter size: 20 G Hand hygiene performed  and maximum sterile barriers used  Allen's test indicative of satisfactory collateral circulation Attempts: 2 Following insertion, dressing applied and Biopatch. Post procedure assessment: normal  Patient tolerated the procedure well with no immediate complications.

## 2023-12-18 NOTE — Progress Notes (Addendum)
 Patient Name: Harold Park Date of Encounter: 12/18/2023  Primary Cardiologist: Harold Bihari, MD Electrophysiologist: Harold FORBES Furbish, MD  Interval Summary   The patient is doing well today.  At this time, the patient denies chest pain, shortness of breath, or any new concerns.  Vital Signs    Vitals:   12/18/23 0445 12/18/23 0500 12/18/23 0600 12/18/23 0700  BP:  (!) 95/57 (!) 92/50 (!) 94/56  Pulse: 82 81 80 81  Resp: 19 17 17 19   Temp:    (!) 97 F (36.1 C)  TempSrc:    Axillary  SpO2: 96% 97% 97% 95%  Weight:  (!) 136.7 kg    Height:        Intake/Output Summary (Last 24 hours) at 12/18/2023 0840 Last data filed at 12/18/2023 0751 Gross per 24 hour  Intake 1457.77 ml  Output --  Net 1457.77 ml   Filed Weights   12/16/23 0530 12/17/23 0500 12/18/23 0500  Weight: 133.9 kg 134.8 kg (!) 136.7 kg    Physical Exam    GEN- Chronicall ill appearing.  Lungs- Normal effort.  Cardiac- Regular rate and rhythm GI- soft, NT, ND, + BS Extremities- no clubbing or cyanosis. Trace edema  Telemetry    NSR 80s, occasional PVCs no further VT (personally reviewed)  Hospital Course    Harold Park is a 58 y.o. male with a history of persistent AF, Typical and atypical AFL, Chronic biventricular failure, and Obesity who is being seen today for the evaluation of recurrent and poorly tolerated atrial arrhythmias and VT at the request of Dr. Zenaida.   Assessment & Plan    VT In setting of cardiogenic shock Harold Park need CRT-D prior to discharge pending course Remains quiescent. Continue IV amiodarone  while on inotropes. OK to decreased to 30mg /hr Continue mexitil .  No driving x 6 months.    Paroxysmal AF Paroxysmal AFL, typical and atypical Tolerates very poorly. Had planned for ablation then ICD, but given recurrence Harold Park likely require AV nodal ablation and CRT-D given VT this admission and need for shock Continue heparin  as bridge for procedures.    Acute on  chronic biventricular failure Possible Sarcoidosis HF team following [facility] 10/7 with continued low output and elevated filling pressures.  Milrinone  per HF team.  Coox 75.9% this am.    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   Patient is critically ill and in danger of multiple system organ failure.     For questions or updates, please contact Harold Park Please consult [email or web address] for contact info under     Signed, Harold Prentice Passey, PA-C  12/18/2023, 8:40 AM     Harold Park was seen by me today along with Harold Park. I have personally performed an evaluation on this patient.  My findings are as follows: 58 y.o. male remains in sinus rhythm.  Creatinine has worsened.  Complains of fatigue and weakness..  Data: EKG(s) and pertinent labs, studies, etc were personally reviewed and interpreted by me:  Daily labs, EKG, telemetry Otherwise, I agree with data as outlined by the advanced practice provider.  Exam performed by me: Gen: No acute distress Neck: + JVD Cardiac: Regular rhythm Lungs: Normal work of breathing Extremities: Trace edema  My Assessment and Plan:  1.  Ventricular tachycardia: Occurred in the setting of cardiogenic shock.  On IV amiodarone .  Harold Park reduce to 30 mg/h.  Harold Park need ICD implant  prior to discharge.  Continue mexiletine.  2.  Paroxysmal atrial fibrillation/typical and atypical atrial flutter: Tolerates very poorly.  Has remained in sinus rhythm with IV amiodarone .  Harold Park potentially plan for AV nodal ablation at the time of CRT-D.  3.  Acute on chronic systolic heart failure, possible sarcoidosis: Managed by heart failure team.  4.  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  CRT-D at next availability once critical illness has improved.  He to see as needed through the  weekend.  Please call us  back if there are any issues that arise.  Signed,  Harold Park Gladis Norton, MD  12/18/2023 9:14 AM

## 2023-12-18 NOTE — Progress Notes (Signed)
 Advanced Heart Failure Rounding Note  Cardiologist: Wilbert Bihari, MD  Chief Complaint: A/C HF Subjective:   10/6 Started on Milirnone 0.125 for low output.  Developed VTwith shock x1. Sleeping at the time but described some chest burning. Lidocaine  added. K supplemented.  10/7 Cath - coronaries ok. Elevated filling pressures, CI 1.7. Milrinone  increased to 0.25 mcg. Started on pulse steroids for possible sarcoid  CO-OX 76% on Milrinone  0.25. Remains on Amio @60 . Now off lidocaine  gtt, on mexiletine.   CVP 14, lasix  gtt stopped 10/8  Lactic acid 2.7 yesterday  Na 113, corrected to 122.   SCr 2.18>2.45>2.77>3.06>3.25 today  Resting comfortably in bed, was able to get some rest last night. Family at bedside.   Objective:   Weight Range: (!) 136.7 kg Body mass index is 35.74 kg/m.   Vital Signs:   Temp:  [97.6 F (36.4 C)] 97.6 F (36.4 C) (10/09 2330) Pulse Rate:  [80-89] 81 (10/10 0700) Resp:  [16-28] 19 (10/10 0700) BP: (74-148)/(43-128) 94/56 (10/10 0700) SpO2:  [92 %-99 %] 95 % (10/10 0700) Weight:  [136.7 kg] 136.7 kg (10/10 0500) Last BM Date : 12/14/23  Weight change: Filed Weights   12/16/23 0530 12/17/23 0500 12/18/23 0500  Weight: 133.9 kg 134.8 kg (!) 136.7 kg    Intake/Output:   Intake/Output Summary (Last 24 hours) at 12/18/2023 0715 Last data filed at 12/18/2023 0600 Gross per 24 hour  Intake 1416.43 ml  Output --  Net 1416.43 ml     CVP 13 Physical Exam  General:  chronically ill appearing.  No respiratory difficulty Neck: JVD ~14 cm.  Cor: Regular rate & rhythm. No murmurs. Lungs: clear, diminished bases Extremities: trace BLE edema, + ted hose Neuro: alert & oriented x 3. Affect pleasant.   Telemetry  NSR 80s, 4-9 PVCs (Personally reviewed)    Labs    CBC Recent Labs    12/16/23 0949 12/18/23 0506  WBC 11.9* 16.5*  NEUTROABS 11.1*  --   HGB 14.4  12.7* 14.4  HCT 41.9 40.5  MCV 85.3 82.8  PLT 198 183   Basic Metabolic  Panel Recent Labs    12/16/23 0512 12/16/23 2124 12/17/23 0459 12/17/23 1256 12/17/23 1556 12/18/23 0506  NA 119*   < > 118*   < > 119* 113*  K 3.6   < > 4.1   < > 4.4 4.0  CL 84*   < > 82*   < > 86* 76*  CO2 20*   < > 19*   < > 17* 17*  GLUCOSE 388*   < > 426*   < > 211* 489*  BUN 34*   < > 44*   < > 54* 58*  CREATININE 1.86*   < > 2.45*   < > 3.06* 3.25*  CALCIUM  8.2*   < > 8.2*   < > 8.7* 7.8*  MG 2.2  --  2.2  --   --   --    < > = values in this interval not displayed.   Liver Function Tests Recent Labs    12/16/23 0949  AST 36  ALT 55*  ALKPHOS 49  BILITOT 0.8  PROT 5.5*  ALBUMIN  2.6*   No results for input(s): LIPASE, AMYLASE in the last 72 hours. Cardiac Enzymes No results for input(s): CKTOTAL, CKMB, CKMBINDEX, TROPONINI in the last 72 hours.  BNP: BNP (last 3 results) Recent Labs    01/13/23 1126 08/06/23 1522  BNP 70.0 36.5  ProBNP (last 3 results) Recent Labs    12/11/23 1807  PROBNP 6,368.0*     D-Dimer No results for input(s): DDIMER in the last 72 hours. Hemoglobin A1C No results for input(s): HGBA1C in the last 72 hours.  Fasting Lipid Panel No results for input(s): CHOL, HDL, LDLCALC, TRIG, CHOLHDL, LDLDIRECT in the last 72 hours. Thyroid  Function Tests No results for input(s): TSH, T4TOTAL, T3FREE, THYROIDAB in the last 72 hours.  Invalid input(s): FREET3  Other results:   Imaging    No results found.   Medications:     Scheduled Medications:  calcium  citrate  200 mg of elemental calcium  Oral Daily   Chlorhexidine  Gluconate Cloth  6 each Topical Daily   cholecalciferol  1,000 Units Oral Daily   DULoxetine   20 mg Oral Daily   insulin  aspart  0-20 Units Subcutaneous TID WC   insulin  aspart  0-5 Units Subcutaneous QHS   insulin  glargine  20 Units Subcutaneous Daily   mexiletine  150 mg Oral Q8H   mycophenolate  500 mg Oral BID   pantoprazole  40 mg Oral Daily   predniSONE   30  mg Oral Q breakfast   sodium chloride  flush  10-40 mL Intracatheter Q12H   sodium chloride  flush  10-40 mL Intracatheter Q12H   sodium chloride  flush  3 mL Intravenous Q12H   tamsulosin   0.4 mg Oral QPC supper    Infusions:  amiodarone  60 mg/hr (12/18/23 0600)   heparin  1,450 Units/hr (12/18/23 0600)   milrinone  0.25 mcg/kg/min (12/18/23 0600)    PRN Medications: acetaminophen  **OR** acetaminophen , oxyCODONE , prochlorperazine, senna, sodium chloride  flush, sodium chloride  flush Patient Profile  58 year old with a history of HFrEF, PAF, OSA, VT, CKD Stage IIIa.   HF following for cardiogenic shock.  Assessment/Plan   A/C HFrEF, Cardiogenic Shock  CT coronary 03/06/20. Calcium  score 0. Poor arterial imaging but no obvious CAD - CMRI 2017 LVEF 20%. No LGE - Cardiac PET 2023- no evidence for cardiac sarcoid or inflammatory. LVEF 38%. - Echo 5/25 EF 30-35% -cMRI 2/25 EF 28 % RV 33% LGE pattern concerning for sarcoid  - Echo 12/12/23 EF < 15 severe RV HK - CO-OX 76%. QX Calculate Fick CO 9.2 CI 3.37. Continue milrinone  0.25 mcg.  - Lactic acid up to 2.7 yesterday. Will repeat this morning.  - Start NE @ fixed rate of 2.  - Place A line to monitor BPs as they have been fluctuating. Aim for goal MAP >70 - CVP 14. Have been holding diuresis, weight trending up. Will give 160 IV lasix  x1 and start lasix  gtt at 40mg /hr.  - Will also give 3% 150 Q12 for 2 doses to help with diuresis.  - GDMT limited by hypotension.  - Hold spiro and digoxin  with elevated SCr. 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 - underwent DC-CV on 12/10/23 - Maintaining SR. Continue IV amio. Of note he has previously had difficulty taking amio due to nausea.  - Continue heparin   - Consult EP for potential AFL ablation (versus AVN/pacer) + ICD this admit   VT -S/P DC-CV 10/4  -S/P DC- CV 10/6  -Continue amio drip  -Lidocaine  gtt stopped 10/9. Now on mexiletine.  -EP following.   Possible  Sarcoid -PET? Consulted EP. -Now on Sarcoid Protocol. Giving pulse steroids x 3 days and on immunosuppression.  -Check labs. At this point will hold off on vaccines.   AKI on CKD Stage IIIa  -Creatinine baseline ~  1.6-1.8 - SCr up to 3.25 today - Avoid hypotension - Plan to trial high diuretic doses today. Diuretics as above - Check Urina Na, Osm, Cr and UA - Bladder scan Q6  OSA  -Continue CPAP  Hyponatremia  - Corrected Na 122 - 3% today as above - Follow   CRITICAL CARE Performed by: Beckey LITTIE Coe   Total critical care time: 13 minutes  Critical care time was exclusive of separately billable procedures and treating other patients.  Critical care was necessary to treat or prevent imminent or life-threatening deterioration.  Critical care was time spent personally by me on the following activities: development of treatment plan with patient and/or surrogate as well as nursing, discussions with consultants, evaluation of patient's response to treatment, examination of patient, obtaining history from patient or surrogate, ordering and performing treatments and interventions, ordering and review of laboratory studies, ordering and review of radiographic studies, pulse oximetry and re-evaluation of patient's condition.     Length of Stay: 6  Beckey LITTIE Coe, NP  12/18/2023, 7:15 AM  Advanced Heart Failure Team Pager [phone] (M-F; 7a - 5p)  Please contact [facility] for night-coverage after hours (5p -7a ) and weekends on [email or web address]

## 2023-12-19 DIAGNOSIS — R570 Cardiogenic shock: Secondary | ICD-10-CM | POA: Diagnosis not present

## 2023-12-19 DIAGNOSIS — I5023 Acute on chronic systolic (congestive) heart failure: Secondary | ICD-10-CM | POA: Diagnosis not present

## 2023-12-19 LAB — BASIC METABOLIC PANEL WITH GFR
Anion gap: 16 — ABNORMAL HIGH (ref 5–15)
BUN: 74 mg/dL — ABNORMAL HIGH (ref 6–20)
CO2: 15 mmol/L — ABNORMAL LOW (ref 22–32)
Calcium: 8.5 mg/dL — ABNORMAL LOW (ref 8.9–10.3)
Chloride: 84 mmol/L — ABNORMAL LOW (ref 98–111)
Creatinine, Ser: 3.74 mg/dL — ABNORMAL HIGH (ref 0.61–1.24)
GFR, Estimated: 18 mL/min — ABNORMAL LOW (ref >60)
Glucose, Bld: 252 mg/dL — ABNORMAL HIGH (ref 70–99)
Potassium: 4.8 mmol/L (ref 3.5–5.1)
Sodium: 115 mmol/L — CL (ref 135–145)

## 2023-12-19 LAB — COMPREHENSIVE METABOLIC PANEL WITH GFR
ALT: 49 U/L — ABNORMAL HIGH (ref 0–44)
ALT: 52 U/L — ABNORMAL HIGH (ref 0–44)
AST: 36 U/L (ref 15–41)
AST: 34 U/L (ref 15–41)
Albumin: 2.9 g/dL — ABNORMAL LOW (ref 3.5–5.0)
Albumin: 3 g/dL — ABNORMAL LOW (ref 3.5–5.0)
Alkaline Phosphatase: 56 U/L (ref 38–126)
Alkaline Phosphatase: 56 U/L (ref 38–126)
Anion gap: 14 (ref 5–15)
Anion gap: 15 (ref 5–15)
BUN: 77 mg/dL — ABNORMAL HIGH (ref 6–20)
BUN: 78 mg/dL — ABNORMAL HIGH (ref 6–20)
CO2: 17 mmol/L — ABNORMAL LOW (ref 22–32)
CO2: 15 mmol/L — ABNORMAL LOW (ref 22–32)
Calcium: 8.1 mg/dL — ABNORMAL LOW (ref 8.9–10.3)
Calcium: 8.2 mg/dL — ABNORMAL LOW (ref 8.9–10.3)
Chloride: 84 mmol/L — ABNORMAL LOW (ref 98–111)
Chloride: 85 mmol/L — ABNORMAL LOW (ref 98–111)
Creatinine, Ser: 3.62 mg/dL — ABNORMAL HIGH (ref 0.61–1.24)
Creatinine, Ser: 3.62 mg/dL — ABNORMAL HIGH (ref 0.61–1.24)
GFR, Estimated: 19 mL/min — ABNORMAL LOW (ref >60)
GFR, Estimated: 19 mL/min — ABNORMAL LOW (ref >60)
Glucose, Bld: 271 mg/dL — ABNORMAL HIGH (ref 70–99)
Glucose, Bld: 221 mg/dL — ABNORMAL HIGH (ref 70–99)
Potassium: 4.5 mmol/L (ref 3.5–5.1)
Potassium: 4.4 mmol/L (ref 3.5–5.1)
Sodium: 115 mmol/L — CL (ref 135–145)
Sodium: 115 mmol/L — CL (ref 135–145)
Total Bilirubin: 1.3 mg/dL — ABNORMAL HIGH (ref 0.0–1.2)
Total Bilirubin: 1.2 mg/dL (ref 0.0–1.2)
Total Protein: 5.7 g/dL — ABNORMAL LOW (ref 6.5–8.1)
Total Protein: 5.6 g/dL — ABNORMAL LOW (ref 6.5–8.1)

## 2023-12-19 LAB — GLUCOSE, CAPILLARY
Glucose-Capillary: 294 mg/dL — ABNORMAL HIGH (ref 70–99)
Glucose-Capillary: 301 mg/dL — ABNORMAL HIGH (ref 70–99)
Glucose-Capillary: 262 mg/dL — ABNORMAL HIGH (ref 70–99)
Glucose-Capillary: 213 mg/dL — ABNORMAL HIGH (ref 70–99)

## 2023-12-19 LAB — COOXEMETRY PANEL
Carboxyhemoglobin: 1.8 % — ABNORMAL HIGH (ref 0.5–1.5)
Methemoglobin: 0.9 % (ref 0.0–1.5)
O2 Saturation: 66.9 %
Total hemoglobin: 14.9 g/dL (ref 12.0–16.0)

## 2023-12-19 LAB — CBC
HCT: 44.5 % (ref 39.0–52.0)
Hemoglobin: 15.7 g/dL (ref 13.0–17.0)
MCH: 28.7 pg (ref 26.0–34.0)
MCHC: 35.3 g/dL (ref 30.0–36.0)
MCV: 81.4 fL (ref 80.0–100.0)
Platelets: 183 K/uL (ref 150–400)
RBC: 5.47 MIL/uL (ref 4.22–5.81)
RDW: 13.9 % (ref 11.5–15.5)
WBC: 16.2 K/uL — ABNORMAL HIGH (ref 4.0–10.5)
nRBC: 2.2 % — ABNORMAL HIGH (ref 0.0–0.2)

## 2023-12-19 LAB — PHOSPHORUS: Phosphorus: 6.3 mg/dL — ABNORMAL HIGH (ref 2.5–4.6)

## 2023-12-19 LAB — MAGNESIUM: Magnesium: 2.5 mg/dL — ABNORMAL HIGH (ref 1.7–2.4)

## 2023-12-19 LAB — HEPARIN LEVEL (UNFRACTIONATED)
Heparin Unfractionated: >1.1 [IU]/mL — ABNORMAL HIGH (ref 0.30–0.70)
Heparin Unfractionated: 0.51 [IU]/mL (ref 0.30–0.70)

## 2023-12-19 MED ORDER — SODIUM CHLORIDE 3 % IV BOLUS
150.0000 mL | Freq: Three times a day (TID) | INTRAVENOUS | Status: AC
Start: 2023-12-19 — End: 2023-12-19
  Administered 2023-12-19 (×2): 150 mL via INTRAVENOUS
  Filled 2023-12-19 (×2): qty 500

## 2023-12-19 MED ORDER — SODIUM CHLORIDE 0.9 % IV SOLN: INTRAVENOUS | Status: AC | PRN

## 2023-12-19 NOTE — Progress Notes (Signed)
 Advanced Heart Failure Rounding Note  Cardiologist: Wilbert Bihari, MD  Chief Complaint: A/C HF Subjective:   10/6 Started on Milirnone 0.125 for low output.  Developed VTwith shock x1. Sleeping at the time but described some chest burning. Lidocaine  added. K supplemented.  10/7 Cath - coronaries ok. Elevated filling pressures, CI 1.7. Milrinone  increased to 0.25 mcg. Started on pulse steroids for possible sarcoid  Coox 67 on reduced dose of milrinone . CVP remains elevated at 18. Slowly increasing urine output on high dose lasix  gtt, but worsening metabolic acidosis and renal function. Repeat HTS today as well as lasix  gtt. Bcx with no growth to date.     Objective:   Weight Range: (!) 136.8 kg Body mass index is 35.76 kg/m.   Vital Signs:   Temp:  [96.5 F (35.8 C)] 96.5 F (35.8 C) (10/11 0700) Pulse Rate:  [89-101] 95 (10/11 1006) Resp:  [14-30] 20 (10/11 1006) SpO2:  [86 %-96 %] 95 % (10/11 1006) Arterial Line BP: (65-108)/(48-84) 94/69 (10/11 1006) Weight:  [136.8 kg] 136.8 kg (10/11 0500) Last BM Date : 12/19/23  Weight change: Filed Weights   12/17/23 0500 12/18/23 0500 12/19/23 0500  Weight: 134.8 kg (!) 136.7 kg (!) 136.8 kg    Intake/Output:   Intake/Output Summary (Last 24 hours) at 12/19/2023 1206 Last data filed at 12/19/2023 1118 Gross per 24 hour  Intake 2502.39 ml  Output 1225 ml  Net 1277.39 ml     CVP 13 Physical Exam  GENERAL: ill appearing PULM:  Normal work of breathing CARDIAC:  JVP: moderately elevated        Normal rate and rhythm, systolic murmur, 1+ LE edema ABDOMEN: Soft, non-tender, non-distended. NEUROLOGIC: Patient is oriented x3 with no focal or lateralizing neurologic deficits.    Telemetry  NSR 80s, 4-9 PVCs (Personally reviewed)    Labs    CBC Recent Labs    12/18/23 0506 12/19/23 0500  WBC 16.5* 16.2*  HGB 14.4 15.7  HCT 40.5 44.5  MCV 82.8 81.4  PLT 183 183   Basic Metabolic Panel Recent Labs     12/18/23 0506 12/18/23 1427 12/19/23 0500  NA 113* 118* 115*  K 4.0 4.7 4.8  CL 76* 86* 84*  CO2 17* 15* 15*  GLUCOSE 489* 229* 252*  BUN 58* 69* 74*  CREATININE 3.25* 3.68* 3.74*  CALCIUM  7.8* 8.7* 8.5*  MG 2.1  --  2.5*  PHOS  --   --  6.3*   Liver Function Tests No results for input(s): AST, ALT, ALKPHOS, BILITOT, PROT, ALBUMIN  in the last 72 hours.  No results for input(s): LIPASE, AMYLASE in the last 72 hours. Cardiac Enzymes No results for input(s): CKTOTAL, CKMB, CKMBINDEX, TROPONINI in the last 72 hours.  BNP: BNP (last 3 results) Recent Labs    01/13/23 1126 08/06/23 1522  BNP 70.0 36.5    ProBNP (last 3 results) Recent Labs    12/11/23 1807  PROBNP 6,368.0*     Medications:     Scheduled Medications:  calcium  citrate  200 mg of elemental calcium  Oral Daily   Chlorhexidine  Gluconate Cloth  6 each Topical Daily   cholecalciferol  1,000 Units Oral Daily   dapsone  100 mg Oral Daily   DULoxetine   20 mg Oral Daily   insulin  aspart  0-20 Units Subcutaneous TID WC   insulin  aspart  0-5 Units Subcutaneous QHS   insulin  aspart  4 Units Subcutaneous TID WC   insulin  glargine  25 Units  Subcutaneous Daily   methotrexate  5 mg Oral Weekly   mexiletine  150 mg Oral Q8H   pantoprazole  40 mg Oral Daily   polyethylene glycol  17 g Oral BID   predniSONE   30 mg Oral Q breakfast   senna  2 tablet Oral Daily   sodium chloride  flush  10-40 mL Intracatheter Q12H   sodium chloride  flush  10-40 mL Intracatheter Q12H   sodium chloride  flush  3 mL Intravenous Q12H   tamsulosin   0.4 mg Oral QPC supper    Infusions:  sodium chloride  20 mL/hr at 12/19/23 1133   amiodarone  30 mg/hr (12/19/23 1131)   furosemide  (LASIX ) 200 mg in dextrose  5 % 100 mL (2 mg/mL) infusion 40 mg/hr (12/19/23 1132)   heparin  1,450 Units/hr (12/19/23 1118)   milrinone  0.125 mcg/kg/min (12/19/23 1118)   norepinephrine  (LEVOPHED ) Adult infusion 18 mcg/min (12/19/23 1118)    sodium chloride  3% (hypertonic) Stopped (12/19/23 1049)   vasopressin 0.04 Units/min (12/19/23 1118)    PRN Medications: sodium chloride , acetaminophen  **OR** acetaminophen , oxyCODONE , prochlorperazine, sodium chloride  flush, sodium chloride  flush Patient Profile  58 year old with a history of HFrEF, PAF, OSA, VT, CKD Stage IIIa.   HF following for cardiogenic shock.  Assessment/Plan   A/C HFrEF, Cardiogenic Shock  - Normal coronary arteriography 10/0 - Cardiac PET 2023- no evidence for cardiac sarcoid or inflammatory. LVEF 38%. -cMRI 2/25 EF 28 % RV 33% LGE pattern concerning for sarcoid  - Echo 12/12/23 EF < 15 severe RV HK - CO-OX 67, reduced milrinone  to 0.125 yesterday given hypotension - Remains on NE as well as vasopressin for distributive shock - Etiology of low SVR unclear, infectious workup negative thus far, on steroids.  - If worsening can consider empiric abx -Arterial line for BP monitoring - Continue IV lasix  gtt at 40mg /hour - Continue 3% 150 Q12 for 2 doses to help with diuresis.  - GDMT limited by hypotension.  - Hold spiro and digoxin  with elevated SCr.    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 - underwent DC-CV on 12/10/23 - Maintaining SR. Continue IV amio. Of note he has previously had difficulty taking amio due to nausea.  - Continue heparin   - Consult EP for potential AFL ablation (versus AVN/pacer) + ICD this admit. He is not at all optimized for this procedure   VT -S/P DC-CV 10/4  -S/P DC- CV 10/6  -Continue amio drip  -Lidocaine  gtt stopped 10/9. Now on mexiletine.  -EP following.   Possible Sarcoid -Consulted EP. -Now on Sarcoid Protocol. Giving pulse steroids x 3 days and on immunosuppression with methotrexate and prednisone  - No further VT -Check labs. At this point will hold off on vaccines.   AKI on CKD Stage IIIa  -Creatinine baseline ~ 1.6-1.8 - SCr continues to rise - If not significantly improving tomorrow will likely need trial of  CRRT - Avoid hypotension - Continue high dose diuretics - Low urine sodium, bland urine - Foley placement - Unna boots  OSA  -Continue CPAP  Hyponatremia  - 3% today as above - Follow  CRITICAL CARE Performed by: Morene JINNY Brownie   Total critical care time: 42 minutes  Critical care time was exclusive of separately billable procedures and treating other patients.  Critical care was necessary to treat or prevent imminent or life-threatening deterioration.  Critical care was time spent personally by me on the following activities: development of treatment plan with patient and/or surrogate as well as nursing, discussions with  consultants, evaluation of patient's response to treatment, examination of patient, obtaining history from patient or surrogate, ordering and performing treatments and interventions, ordering and review of laboratory studies, ordering and review of radiographic studies, pulse oximetry and re-evaluation of patient's condition.     Length of Stay: 7  Morene JINNY Brownie, MD  12/19/2023, 12:06 PM  Advanced Heart Failure Team Pager [phone] (M-F; 7a - 5p)  Please contact [facility] for night-coverage after hours (5p -7a ) and weekends on [email or web address]

## 2023-12-19 NOTE — Progress Notes (Signed)
 Orthopedic Tech Progress Note Patient Details:  Harold Park 1965/08/14 [id number]  Ortho Devices Type of Ortho Device: Radio broadcast assistant Ortho Device/Splint Location: bi-lateral Ortho Device/Splint Interventions: Ordered, Application, Adjustment   Post Interventions Patient Tolerated: Well Instructions Provided: Care of device, Adjustment of device  Chandra Dorn PARAS 12/19/2023, 8:03 PM

## 2023-12-19 NOTE — Progress Notes (Signed)
 PHARMACY - ANTICOAGULATION CONSULT NOTE  Pharmacy Consult for heparin  Indication: atrial fibrillation  Allergies  Allergen Reactions   Bee Venom Anaphylaxis    Patient Measurements: Height: 6' 5 (195.6 cm) Weight: (!) 136.8 kg (301 lb 9.4 oz) IBW/kg (Calculated) : 89.1 HEPARIN  DW (KG): 119  Vital Signs: Temp: 96.5 F (35.8 C) (10/11 0700) Temp Source: Axillary (10/11 0700) Pulse Rate: 95 (10/11 0805)  Labs: Recent Labs    12/16/23 0949 12/16/23 2124 12/18/23 0506 12/18/23 1427 12/19/23 0500 12/19/23 0723  HGB 14.4  12.7*  --  14.4  --  15.7  --   HCT 41.9  --  40.5  --  44.5  --   PLT 198  --  183  --  183  --   HEPARINUNFRC  --    < > 0.49  --  >1.10* 0.51  CREATININE  --    < > 3.25* 3.68* 3.74*  --    < > = values in this interval not displayed.    Estimated Creatinine Clearance: 32.5 mL/min (A) (by C-G formula based on SCr of 3.74 mg/dL (H)).   Medications:   Infusions:   sodium chloride  Stopped (12/18/23 1132)   amiodarone  30 mg/hr (12/19/23 0800)   furosemide  (LASIX ) 200 mg in dextrose  5 % 100 mL (2 mg/mL) infusion 40 mg/hr (12/19/23 0800)   heparin  1,450 Units/hr (12/19/23 0800)   milrinone  0.125 mcg/kg/min (12/19/23 0800)   norepinephrine  (LEVOPHED ) Adult infusion 18 mcg/min (12/19/23 0800)   sodium chloride  3% (hypertonic)     vasopressin 0.04 Units/min (12/19/23 0800)    Assessment: 58 YOM admitted with atrial fibrillation with rapid ventricular response. Patient underwent DCCV on 12/10/23 prior to this admission. Patient was shocked on 12/12/23 PM, now in NSR. Patient on Eliquis  prior to hospitalization, last dose on 12/11/23. Plan to start heparin  infusion prior to possible procedures. Pharmacy consulted for heparin  dosing.   First draw this am with heparin  level > 1.1. Likely lab error, redraw heparin  level 0.51 is therapeutic with heparin  running at 1450 units/hr. Hgb (15.7) and PLTs (183) are stable. Per RN, no report of pauses, issues with the  line, or signs of bleeding.    Goal of Therapy:  Heparin  level 0.3-0.7 units/ml Monitor platelets by anticoagulation protocol: Yes   Plan:  Continue heparin  infusion at 1450 units/hr Monitor heparin  level, CBC, and s/sx of bleeding daily  Thank you for allowing pharmacy to be a part of this patient's care.   Nidia Schaffer, PharmD PGY2 Cardiology Pharmacy Resident  Please check AMION for all [facility] Pharmacy phone numbers After 10:00 PM, call Main Pharmacy [phone] 12/19/2023 8:38 AM

## 2023-12-20 DIAGNOSIS — R570 Cardiogenic shock: Secondary | ICD-10-CM | POA: Diagnosis not present

## 2023-12-20 DIAGNOSIS — I5023 Acute on chronic systolic (congestive) heart failure: Secondary | ICD-10-CM | POA: Diagnosis not present

## 2023-12-20 LAB — BASIC METABOLIC PANEL WITH GFR
Anion gap: 12 (ref 5–15)
Anion gap: 13 (ref 5–15)
BUN: 83 mg/dL — ABNORMAL HIGH (ref 6–20)
BUN: 83 mg/dL — ABNORMAL HIGH (ref 6–20)
CO2: 15 mmol/L — ABNORMAL LOW (ref 22–32)
CO2: 17 mmol/L — ABNORMAL LOW (ref 22–32)
Calcium: 8.2 mg/dL — ABNORMAL LOW (ref 8.9–10.3)
Calcium: 7.8 mg/dL — ABNORMAL LOW (ref 8.9–10.3)
Chloride: 87 mmol/L — ABNORMAL LOW (ref 98–111)
Chloride: 89 mmol/L — ABNORMAL LOW (ref 98–111)
Creatinine, Ser: 3.4 mg/dL — ABNORMAL HIGH (ref 0.61–1.24)
Creatinine, Ser: 3.17 mg/dL — ABNORMAL HIGH (ref 0.61–1.24)
GFR, Estimated: 20 mL/min — ABNORMAL LOW (ref >60)
GFR, Estimated: 22 mL/min — ABNORMAL LOW (ref >60)
Glucose, Bld: 204 mg/dL — ABNORMAL HIGH (ref 70–99)
Glucose, Bld: 190 mg/dL — ABNORMAL HIGH (ref 70–99)
Potassium: 4.4 mmol/L (ref 3.5–5.1)
Potassium: 4 mmol/L (ref 3.5–5.1)
Sodium: 114 mmol/L — CL (ref 135–145)
Sodium: 119 mmol/L — CL (ref 135–145)

## 2023-12-20 LAB — QUANTIFERON-TB GOLD PLUS (RQFGPL)
QuantiFERON Mitogen Value: 0.24 [IU]/mL
QuantiFERON Nil Value: 0.03 [IU]/mL
QuantiFERON TB1 Ag Value: 0.02 [IU]/mL
QuantiFERON TB2 Ag Value: 0.02 [IU]/mL

## 2023-12-20 LAB — CBC
HCT: 44.8 % (ref 39.0–52.0)
Hemoglobin: 15.9 g/dL (ref 13.0–17.0)
MCH: 28.8 pg (ref 26.0–34.0)
MCHC: 35.5 g/dL (ref 30.0–36.0)
MCV: 81 fL (ref 80.0–100.0)
Platelets: 179 K/uL (ref 150–400)
RBC: 5.53 MIL/uL (ref 4.22–5.81)
RDW: 14.4 % (ref 11.5–15.5)
WBC: 18.4 K/uL — ABNORMAL HIGH (ref 4.0–10.5)
nRBC: 3.2 % — ABNORMAL HIGH (ref 0.0–0.2)

## 2023-12-20 LAB — GLUCOSE, CAPILLARY
Glucose-Capillary: 173 mg/dL — ABNORMAL HIGH (ref 70–99)
Glucose-Capillary: 197 mg/dL — ABNORMAL HIGH (ref 70–99)
Glucose-Capillary: 170 mg/dL — ABNORMAL HIGH (ref 70–99)
Glucose-Capillary: 160 mg/dL — ABNORMAL HIGH (ref 70–99)
Glucose-Capillary: 123 mg/dL — ABNORMAL HIGH (ref 70–99)

## 2023-12-20 LAB — QUANTIFERON-TB GOLD PLUS: QuantiFERON-TB Gold Plus: UNDETERMINED — AB

## 2023-12-20 LAB — COOXEMETRY PANEL
Carboxyhemoglobin: 1.4 % (ref 0.5–1.5)
Carboxyhemoglobin: 1.4 % (ref 0.5–1.5)
Methemoglobin: <0.7 % (ref 0.0–1.5)
Methemoglobin: 0.8 % (ref 0.0–1.5)
O2 Saturation: 54.4 %
O2 Saturation: 68.8 %
Total hemoglobin: 15.4 g/dL (ref 12.0–16.0)
Total hemoglobin: 15.2 g/dL (ref 12.0–16.0)

## 2023-12-20 LAB — MAGNESIUM: Magnesium: 2.5 mg/dL — ABNORMAL HIGH (ref 1.7–2.4)

## 2023-12-20 LAB — HEPARIN LEVEL (UNFRACTIONATED): Heparin Unfractionated: 0.45 [IU]/mL (ref 0.30–0.70)

## 2023-12-20 MED ORDER — SODIUM CHLORIDE 3 % IV BOLUS
200.0000 mL | Freq: Three times a day (TID) | INTRAVENOUS | Status: AC
  Administered 2023-12-20 (×2): 200 mL via INTRAVENOUS
  Filled 2023-12-20 (×2): qty 500

## 2023-12-20 NOTE — Progress Notes (Signed)
 PHARMACY - ANTICOAGULATION CONSULT NOTE  Pharmacy Consult for heparin  Indication: atrial fibrillation  Allergies  Allergen Reactions   Bee Venom Anaphylaxis    Patient Measurements: Height: 6' 5 (195.6 cm) Weight: (!) 144.1 kg (317 lb 10.9 oz) IBW/kg (Calculated) : 89.1 HEPARIN  DW (KG): 119  Vital Signs: Temp: 97.5 F (36.4 C) (10/12 0713) Temp Source: Axillary (10/12 0713) Pulse Rate: 86 (10/12 0713)  Labs: Recent Labs    12/18/23 0506 12/18/23 1427 12/19/23 0500 12/19/23 0723 12/19/23 1405 12/19/23 1447 12/20/23 0514  HGB 14.4  --  15.7  --   --   --  15.9  HCT 40.5  --  44.5  --   --   --  44.8  PLT 183  --  183  --   --   --  179  HEPARINUNFRC 0.49  --  >1.10* 0.51  --   --  0.45  CREATININE 3.25*   < > 3.74*  --  3.62* 3.62* 3.40*   < > = values in this interval not displayed.    Estimated Creatinine Clearance: 36.8 mL/min (A) (by C-G formula based on SCr of 3.4 mg/dL (H)).   Medications:   Infusions:   sodium chloride  20 mL/hr at 12/20/23 0700   amiodarone  30 mg/hr (12/20/23 0700)   furosemide  (LASIX ) 200 mg in dextrose  5 % 100 mL (2 mg/mL) infusion 40 mg/hr (12/20/23 0700)   heparin  1,450 Units/hr (12/20/23 0700)   milrinone  0.125 mcg/kg/min (12/20/23 0700)   norepinephrine  (LEVOPHED ) Adult infusion 12 mcg/min (12/20/23 0700)   vasopressin 0.04 Units/min (12/20/23 0700)    Assessment: 58 YOM admitted with atrial fibrillation with rapid ventricular response. Patient underwent DCCV on 12/10/23 prior to this admission. Patient was shocked on 12/12/23 PM, now in NSR. Patient on Eliquis  prior to hospitalization, last dose on 12/11/23. Plan to start heparin  infusion prior to possible procedures. Pharmacy consulted for heparin  dosing.   Heparin  level 0.45 is therapeutic with heparin  running at 1450 units/hr. Hgb (15.9) and PLTs (179) are stable. Per RN, no report of pauses, issues with the line, or signs of bleeding.   Goal of Therapy:  Heparin  level 0.3-0.7  units/ml Monitor platelets by anticoagulation protocol: Yes   Plan:  Continue heparin  infusion at 1450 units/hr Monitor heparin  level, CBC, and s/sx of bleeding daily  Thank you for allowing pharmacy to be a part of this patient's care.   Nidia Schaffer, PharmD PGY2 Cardiology Pharmacy Resident  Please check AMION for all [facility] Pharmacy phone numbers After 10:00 PM, call Main Pharmacy [phone] 12/20/2023 8:25 AM

## 2023-12-20 NOTE — Progress Notes (Signed)
 Advanced Heart Failure Rounding Note  Cardiologist: Wilbert Bihari, MD  Chief Complaint: A/C HF Subjective:   10/6 Started on Milirnone 0.125 for low output.  Developed VTwith shock x1. Sleeping at the time but described some chest burning. Lidocaine  added. K supplemented.  10/7 Cath - coronaries ok. Elevated filling pressures, CI 1.7. Milrinone  increased to 0.25 mcg. Started on pulse steroids for possible sarcoid  Urine output starting to pick up on increased diuretic dosing, creatinine slowly trending down as well. He remains fatigued but has had decreasing pressor doses overnight, now on 8 of NE and vaso at 0.04.   Remains on extremely high dose diuretics with ongoing hyponatremia. While his trend is improving, given his weight gain and extensive fluid intake may still need to consider CRRT if his urine output does not significantly improve.     Objective:   Weight Range: (!) 144.1 kg Body mass index is 37.67 kg/m.   Vital Signs:   Temp:  [97.2 F (36.2 C)-97.5 F (36.4 C)] 97.2 F (36.2 C) (10/12 1100) Pulse Rate:  [82-95] 83 (10/12 1034) Resp:  [8-31] 18 (10/12 1034) SpO2:  [85 %-96 %] 93 % (10/12 1034) Arterial Line BP: (53-115)/(27-107) 90/69 (10/12 1034) Weight:  [144.1 kg] 144.1 kg (10/12 0500) Last BM Date : 12/19/23  Weight change: Filed Weights   12/18/23 0500 12/19/23 0500 12/20/23 0500  Weight: (!) 136.7 kg (!) 136.8 kg (!) 144.1 kg    Intake/Output:   Intake/Output Summary (Last 24 hours) at 12/20/2023 1207 Last data filed at 12/20/2023 1136 Gross per 24 hour  Intake 2979.2 ml  Output 1925 ml  Net 1054.2 ml     CVP 13 Physical Exam  GENERAL: ill appearing PULM: Normal WOB CARDIAC:  JVP: moderately elevated       Normal rate and rhythm, systolic murmur, 1+ LE edema ABDOMEN: Soft, non-tender, non-distended. NEUROLOGIC: Patient is oriented x3 with no focal or lateralizing neurologic deficits.    Telemetry  NSR 80s, 4-9 PVCs (Personally  reviewed)    Labs    CBC Recent Labs    12/19/23 0500 12/20/23 0514  WBC 16.2* 18.4*  HGB 15.7 15.9  HCT 44.5 44.8  MCV 81.4 81.0  PLT 183 179   Basic Metabolic Panel Recent Labs    89/88/74 0500 12/19/23 1405 12/19/23 1447 12/20/23 0514  NA 115*   < > 115* 114*  K 4.8   < > 4.4 4.4  CL 84*   < > 85* 87*  CO2 15*   < > 15* 15*  GLUCOSE 252*   < > 221* 204*  BUN 74*   < > 78* 83*  CREATININE 3.74*   < > 3.62* 3.40*  CALCIUM  8.5*   < > 8.2* 8.2*  MG 2.5*  --   --  2.5*  PHOS 6.3*  --   --   --    < > = values in this interval not displayed.   Liver Function Tests Recent Labs    12/19/23 1405 12/19/23 1447  AST 36 34  ALT 49* 52*  ALKPHOS 56 56  BILITOT 1.3* 1.2  PROT 5.7* 5.6*  ALBUMIN  2.9* 3.0*    Medications:     Scheduled Medications:  calcium  citrate  200 mg of elemental calcium  Oral Daily   Chlorhexidine  Gluconate Cloth  6 each Topical Daily   cholecalciferol  1,000 Units Oral Daily   dapsone  100 mg Oral Daily   DULoxetine   20 mg Oral Daily  insulin  aspart  0-20 Units Subcutaneous TID WC   insulin  aspart  0-5 Units Subcutaneous QHS   insulin  aspart  4 Units Subcutaneous TID WC   insulin  glargine  25 Units Subcutaneous Daily   methotrexate  5 mg Oral Weekly   mexiletine  150 mg Oral Q8H   pantoprazole  40 mg Oral Daily   polyethylene glycol  17 g Oral BID   predniSONE   30 mg Oral Q breakfast   senna  2 tablet Oral Daily   sodium chloride  flush  10-40 mL Intracatheter Q12H   sodium chloride  flush  10-40 mL Intracatheter Q12H   sodium chloride  flush  3 mL Intravenous Q12H   tamsulosin   0.4 mg Oral QPC supper    Infusions:  amiodarone  30 mg/hr (12/20/23 1000)   furosemide  (LASIX ) 200 mg in dextrose  5 % 100 mL (2 mg/mL) infusion 40 mg/hr (12/20/23 1116)   heparin  1,450 Units/hr (12/20/23 1000)   milrinone  0.125 mcg/kg/min (12/20/23 1000)   norepinephrine  (LEVOPHED ) Adult infusion 8 mcg/min (12/20/23 1000)   sodium chloride  3% (hypertonic)  200 mL (12/20/23 1117)   vasopressin 0.04 Units/min (12/20/23 1000)    PRN Medications: acetaminophen  **OR** acetaminophen , oxyCODONE , prochlorperazine, sodium chloride  flush, sodium chloride  flush Patient Profile  58 year old with a history of HFrEF, PAF, OSA, VT, CKD Stage IIIa.   HF following for cardiogenic shock.  Assessment/Plan   A/C HFrEF, Cardiogenic Shock  - Normal coronary arteriography 12/2023 - Cardiac PET 2023- no evidence for cardiac sarcoid or inflammatory. LVEF 38%. -cMRI 2/25 EF 28 % RV 33% LGE pattern concerning for sarcoid  - Echo 12/12/23 EF < 15 severe RV HK - Coox dropped again to 54, but given moderate improvement in renal function will hold at current dose - Appears to be doing better from a VT standpoint, may be able to increase - Remains on NE as well as vasopressin for distributive shock - SVR appears to be improving  - WBC elevated, but in the setting of steroids -Arterial line for BP monitoring - Continue IV lasix  gtt at 40mg /hour - Continue 3% Q12 for 2 doses to help with diuresis.  - GDMT limited by hypotension.  - Hold spiro and digoxin  with elevated SCr.    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 - underwent DC-CV on 12/10/23 - Maintaining SR. Continue IV amio. Of note he has previously had difficulty taking amio due to nausea.  - Continue heparin   - Consult EP for potential AFL ablation (versus AVN/pacer) + ICD this admit. He is not at all optimized for this procedure   VT -S/P DC-CV 10/4  -S/P DC- CV 10/6  -Continue amio drip  -Lidocaine  gtt stopped 10/9. Now on mexiletine.  -EP following.  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 -Now on Sarcoid Protocol. Giving pulse steroids x 3 days and on immunosuppression with methotrexate and prednisone  - No further VT -Check labs. At this point will hold off on vaccines.   AKI on CKD Stage IIIa  -Creatinine baseline ~ 1.6-1.8 - Creatinine slowly  downtrending, but urine output still poor. If not improving by tomorrow may need CRRT for clearance - Avoid hypotension - Continue high dose diuretics - Low urine sodium, bland urine - Foley placement - Unna boots  OSA  -Continue CPAP  Hyponatremia  - Continue 3% today as above - Follow  CRITICAL CARE Performed by: Morene JINNY Brownie   Total critical care time: 45 minutes  Critical care time was exclusive of separately billable procedures and treating other patients.  Critical care was necessary to treat or prevent imminent or life-threatening deterioration.  Critical care was time spent personally by me on the following activities: development of treatment plan with patient and/or surrogate as well as nursing, discussions with consultants, evaluation of patient's response to treatment, examination of patient, obtaining history from patient or surrogate, ordering and performing treatments and interventions, ordering and review of laboratory studies, ordering and review of radiographic studies, pulse oximetry and re-evaluation of patient's condition.      Length of Stay: 8  Morene JINNY Brownie, MD  12/20/2023, 12:07 PM  Advanced Heart Failure Team Pager [phone] (M-F; 7a - 5p)  Please contact [facility] for night-coverage after hours (5p -7a ) and weekends on [email or web address]

## 2023-12-21 ENCOUNTER — Inpatient Hospital Stay (HOSPITAL_COMMUNITY)

## 2023-12-21 DIAGNOSIS — I4510 Unspecified right bundle-branch block: Secondary | ICD-10-CM

## 2023-12-21 DIAGNOSIS — I4892 Unspecified atrial flutter: Secondary | ICD-10-CM | POA: Diagnosis not present

## 2023-12-21 DIAGNOSIS — I5082 Biventricular heart failure: Secondary | ICD-10-CM | POA: Diagnosis not present

## 2023-12-21 DIAGNOSIS — I472 Ventricular tachycardia, unspecified: Secondary | ICD-10-CM | POA: Diagnosis not present

## 2023-12-21 DIAGNOSIS — I5023 Acute on chronic systolic (congestive) heart failure: Secondary | ICD-10-CM | POA: Diagnosis not present

## 2023-12-21 LAB — COOXEMETRY PANEL
Carboxyhemoglobin: 2 % — ABNORMAL HIGH (ref 0.5–1.5)
Carboxyhemoglobin: 1.7 % — ABNORMAL HIGH (ref 0.5–1.5)
Methemoglobin: <0.7 % (ref 0.0–1.5)
Methemoglobin: 1.2 % (ref 0.0–1.5)
O2 Saturation: 64 %
O2 Saturation: 70.8 %
Total hemoglobin: 15 g/dL (ref 12.0–16.0)
Total hemoglobin: 15.7 g/dL (ref 12.0–16.0)

## 2023-12-21 LAB — BASIC METABOLIC PANEL WITH GFR
Anion gap: 15 (ref 5–15)
Anion gap: 15 (ref 5–15)
Anion gap: 12 (ref 5–15)
BUN: 78 mg/dL — ABNORMAL HIGH (ref 6–20)
BUN: 81 mg/dL — ABNORMAL HIGH (ref 6–20)
BUN: 78 mg/dL — ABNORMAL HIGH (ref 6–20)
CO2: 17 mmol/L — ABNORMAL LOW (ref 22–32)
CO2: 15 mmol/L — ABNORMAL LOW (ref 22–32)
CO2: 16 mmol/L — ABNORMAL LOW (ref 22–32)
Calcium: 7.7 mg/dL — ABNORMAL LOW (ref 8.9–10.3)
Calcium: 8.2 mg/dL — ABNORMAL LOW (ref 8.9–10.3)
Calcium: 8.1 mg/dL — ABNORMAL LOW (ref 8.9–10.3)
Chloride: 88 mmol/L — ABNORMAL LOW (ref 98–111)
Chloride: 90 mmol/L — ABNORMAL LOW (ref 98–111)
Chloride: 92 mmol/L — ABNORMAL LOW (ref 98–111)
Creatinine, Ser: 2.95 mg/dL — ABNORMAL HIGH (ref 0.61–1.24)
Creatinine, Ser: 2.89 mg/dL — ABNORMAL HIGH (ref 0.61–1.24)
Creatinine, Ser: 2.9 mg/dL — ABNORMAL HIGH (ref 0.61–1.24)
GFR, Estimated: 24 mL/min — ABNORMAL LOW (ref >60)
GFR, Estimated: 24 mL/min — ABNORMAL LOW (ref >60)
GFR, Estimated: 24 mL/min — ABNORMAL LOW (ref >60)
Glucose, Bld: 254 mg/dL — ABNORMAL HIGH (ref 70–99)
Glucose, Bld: 161 mg/dL — ABNORMAL HIGH (ref 70–99)
Glucose, Bld: 182 mg/dL — ABNORMAL HIGH (ref 70–99)
Potassium: 3.8 mmol/L (ref 3.5–5.1)
Potassium: 4 mmol/L (ref 3.5–5.1)
Potassium: 4 mmol/L (ref 3.5–5.1)
Sodium: 120 mmol/L — ABNORMAL LOW (ref 135–145)
Sodium: 120 mmol/L — ABNORMAL LOW (ref 135–145)
Sodium: 120 mmol/L — ABNORMAL LOW (ref 135–145)

## 2023-12-21 LAB — CBC
HCT: 42.4 % (ref 39.0–52.0)
Hemoglobin: 15 g/dL (ref 13.0–17.0)
MCH: 29.2 pg (ref 26.0–34.0)
MCHC: 35.4 g/dL (ref 30.0–36.0)
MCV: 82.5 fL (ref 80.0–100.0)
Platelets: 143 K/uL — ABNORMAL LOW (ref 150–400)
RBC: 5.14 MIL/uL (ref 4.22–5.81)
RDW: 14.6 % (ref 11.5–15.5)
WBC: 15.2 K/uL — ABNORMAL HIGH (ref 4.0–10.5)
nRBC: 4 % — ABNORMAL HIGH (ref 0.0–0.2)

## 2023-12-21 LAB — HEPATIC FUNCTION PANEL
ALT: 55 U/L — ABNORMAL HIGH (ref 0–44)
AST: 38 U/L (ref 15–41)
Albumin: 3.1 g/dL — ABNORMAL LOW (ref 3.5–5.0)
Alkaline Phosphatase: 64 U/L (ref 38–126)
Bilirubin, Direct: 0.9 mg/dL — ABNORMAL HIGH (ref 0.0–0.2)
Indirect Bilirubin: 1.3 mg/dL — ABNORMAL HIGH (ref 0.3–0.9)
Total Bilirubin: 2.2 mg/dL — ABNORMAL HIGH (ref 0.0–1.2)
Total Protein: 5.7 g/dL — ABNORMAL LOW (ref 6.5–8.1)

## 2023-12-21 LAB — RENAL FUNCTION PANEL
Albumin: 3 g/dL — ABNORMAL LOW (ref 3.5–5.0)
Anion gap: 11 (ref 5–15)
BUN: 69 mg/dL — ABNORMAL HIGH (ref 6–20)
CO2: 16 mmol/L — ABNORMAL LOW (ref 22–32)
Calcium: 7.7 mg/dL — ABNORMAL LOW (ref 8.9–10.3)
Chloride: 95 mmol/L — ABNORMAL LOW (ref 98–111)
Creatinine, Ser: 2.49 mg/dL — ABNORMAL HIGH (ref 0.61–1.24)
GFR, Estimated: 29 mL/min — ABNORMAL LOW (ref >60)
Glucose, Bld: 188 mg/dL — ABNORMAL HIGH (ref 70–99)
Phosphorus: 5.5 mg/dL — ABNORMAL HIGH (ref 2.5–4.6)
Potassium: 3.8 mmol/L (ref 3.5–5.1)
Sodium: 122 mmol/L — ABNORMAL LOW (ref 135–145)

## 2023-12-21 LAB — POCT I-STAT 7, (LYTES, BLD GAS, ICA,H+H)
Acid-base deficit: 9 mmol/L — ABNORMAL HIGH (ref 0.0–2.0)
Bicarbonate: 14.8 mmol/L — ABNORMAL LOW (ref 20.0–28.0)
Calcium, Ion: 1.12 mmol/L — ABNORMAL LOW (ref 1.15–1.40)
HCT: 52 % (ref 39.0–52.0)
Hemoglobin: 17.7 g/dL — ABNORMAL HIGH (ref 13.0–17.0)
O2 Saturation: 98 %
Potassium: 4.1 mmol/L (ref 3.5–5.1)
Sodium: 123 mmol/L — ABNORMAL LOW (ref 135–145)
TCO2: 16 mmol/L — ABNORMAL LOW (ref 22–32)
pCO2 arterial: 26.3 mmHg — ABNORMAL LOW (ref 32–48)
pH, Arterial: 7.358 (ref 7.35–7.45)
pO2, Arterial: 106 mmHg (ref 83–108)

## 2023-12-21 LAB — MAGNESIUM: Magnesium: 2.6 mg/dL — ABNORMAL HIGH (ref 1.7–2.4)

## 2023-12-21 LAB — HEPARIN LEVEL (UNFRACTIONATED): Heparin Unfractionated: 0.52 [IU]/mL (ref 0.30–0.70)

## 2023-12-21 LAB — GLUCOSE, CAPILLARY
Glucose-Capillary: 145 mg/dL — ABNORMAL HIGH (ref 70–99)
Glucose-Capillary: 156 mg/dL — ABNORMAL HIGH (ref 70–99)
Glucose-Capillary: 180 mg/dL — ABNORMAL HIGH (ref 70–99)
Glucose-Capillary: 186 mg/dL — ABNORMAL HIGH (ref 70–99)
Glucose-Capillary: 159 mg/dL — ABNORMAL HIGH (ref 70–99)

## 2023-12-21 LAB — KAPPA/LAMBDA LIGHT CHAINS
Kappa free light chain: 28.5 mg/L — ABNORMAL HIGH (ref 3.3–19.4)
Kappa, lambda light chain ratio: 1.47 (ref 0.26–1.65)
Lambda free light chains: 19.4 mg/L (ref 5.7–26.3)

## 2023-12-21 LAB — SODIUM: Sodium: 126 mmol/L — ABNORMAL LOW (ref 135–145)

## 2023-12-21 MED ORDER — RENA-VITE PO TABS
1.0000 | ORAL_TABLET | Freq: Every day | ORAL | Status: DC
  Administered 2023-12-21: 1
  Filled 2023-12-21: qty 1

## 2023-12-21 MED ORDER — TOLVAPTAN 15 MG PO TABS
15.0000 mg | ORAL_TABLET | Freq: Once | ORAL | Status: AC
  Administered 2023-12-21: 15 mg via ORAL
  Filled 2023-12-21: qty 1

## 2023-12-21 MED ORDER — DOBUTAMINE-DEXTROSE 4-5 MG/ML-% IV SOLN
10.0000 ug/kg/min | INTRAVENOUS | Status: DC
  Administered 2023-12-21 – 2023-12-22 (×2): 5 ug/kg/min via INTRAVENOUS
  Administered 2023-12-23 – 2023-12-29 (×9): 7.5 ug/kg/min via INTRAVENOUS
  Administered 2023-12-29: 10 ug/kg/min via INTRAVENOUS
  Filled 2023-12-21 (×12): qty 250

## 2023-12-21 MED ORDER — SODIUM CHLORIDE 0.9 % IV SOLN
INTRAVENOUS | Status: AC
Start: 2023-12-21 — End: 2023-12-24

## 2023-12-21 MED ORDER — PRISMASOL BGK 4/2.5 32-4-2.5 MEQ/L EC SOLN: Status: DC

## 2023-12-21 MED ORDER — HEPARIN SODIUM (PORCINE) 1000 UNIT/ML DIALYSIS
1000.0000 [IU] | INTRAMUSCULAR | Status: DC | PRN
  Administered 2023-12-22: 2000 [IU] via INTRAVENOUS_CENTRAL
  Filled 2023-12-21 (×2): qty 6
  Filled 2023-12-21: qty 2

## 2023-12-21 MED ORDER — PROSOURCE PLUS PO LIQD
30.0000 mL | Freq: Two times a day (BID) | ORAL | Status: DC
  Administered 2023-12-21 – 2023-12-22 (×2): 30 mL via ORAL
  Filled 2023-12-21 (×2): qty 30

## 2023-12-21 MED ORDER — ORAL CARE MOUTH RINSE: 15.0000 mL | OROMUCOSAL | Status: DC | PRN

## 2023-12-21 MED ORDER — ENSURE PLUS HIGH PROTEIN PO LIQD
237.0000 mL | Freq: Three times a day (TID) | ORAL | Status: DC
  Administered 2023-12-21 – 2023-12-23 (×3): 237 mL via ORAL

## 2023-12-21 MED ORDER — HEPARIN SODIUM (PORCINE) 1000 UNIT/ML IJ SOLN
INTRAMUSCULAR | Status: AC
  Administered 2023-12-21: 2400 [IU] via INTRAVENOUS_CENTRAL
  Filled 2023-12-21: qty 6

## 2023-12-21 MED ORDER — SODIUM CHLORIDE 3 % IV BOLUS
200.0000 mL | Freq: Three times a day (TID) | INTRAVENOUS | Status: DC
  Administered 2023-12-21 – 2023-12-22 (×3): 200 mL via INTRAVENOUS
  Filled 2023-12-21 (×4): qty 500

## 2023-12-21 MED ORDER — RENA-VITE PO TABS
1.0000 | ORAL_TABLET | Freq: Every day | ORAL | Status: DC
Start: 2023-12-22 — End: 2023-12-24
  Administered 2023-12-22 – 2023-12-23 (×2): 1 via ORAL
  Filled 2023-12-21 (×2): qty 1

## 2023-12-21 MED ORDER — HEPARIN (PORCINE) 2000 UNITS/L FOR CRRT
INTRAVENOUS_CENTRAL | Status: DC | PRN
  Administered 2023-12-22: 2000 mL via INTRAVENOUS_CENTRAL

## 2023-12-21 MED ORDER — POTASSIUM CHLORIDE CRYS ER 20 MEQ PO TBCR
20.0000 meq | EXTENDED_RELEASE_TABLET | Freq: Once | ORAL | Status: AC
  Administered 2023-12-21: 20 meq via ORAL
  Filled 2023-12-21: qty 1

## 2023-12-21 NOTE — Progress Notes (Signed)
 PHARMACY - ANTICOAGULATION CONSULT NOTE  Pharmacy Consult for heparin  Indication: atrial fibrillation  Allergies  Allergen Reactions   Bee Venom Anaphylaxis    Patient Measurements: Height: 6' 5 (195.6 cm) Weight: (!) 149.3 kg (329 lb 2.4 oz) IBW/kg (Calculated) : 89.1 HEPARIN  DW (KG): 119  Vital Signs: Pulse Rate: 84 (10/13 0445)  Labs: Recent Labs    12/19/23 0500 12/19/23 0723 12/19/23 1405 12/20/23 0514 12/20/23 1415 12/21/23 0454  HGB 15.7  --   --  15.9  --  15.0  HCT 44.5  --   --  44.8  --  42.4  PLT 183  --   --  179  --  143*  HEPARINUNFRC >1.10* 0.51  --  0.45  --  0.52  CREATININE 3.74*  --    < > 3.40* 3.17* 2.95*   < > = values in this interval not displayed.    Estimated Creatinine Clearance: 43.2 mL/min (A) (by C-G formula based on SCr of 2.95 mg/dL (H)).   Medications:   Infusions:   amiodarone  30 mg/hr (12/21/23 0700)   furosemide  (LASIX ) 200 mg in dextrose  5 % 100 mL (2 mg/mL) infusion 40 mg/hr (12/21/23 0700)   heparin  1,450 Units/hr (12/21/23 0700)   milrinone  0.125 mcg/kg/min (12/21/23 0700)   norepinephrine  (LEVOPHED ) Adult infusion 17 mcg/min (12/21/23 0700)   vasopressin 0.04 Units/min (12/21/23 0700)    Assessment: 58 YOM admitted with atrial fibrillation with rapid ventricular response. Patient underwent DCCV on 12/10/23 prior to this admission. Patient was shocked on 12/12/23 PM, now in NSR. Patient on Eliquis  prior to hospitalization, last dose on 12/11/23. Plan to start heparin  infusion prior to possible procedures. Pharmacy consulted for heparin  dosing.   12/21/23 AM: Heparin  level 0.52, therapeutic on heparin  1450 units/hr. No issues with infusion running or signs of bleeding per RN. CBC stable (Hgb 15.0, PLT 143).   Goal of Therapy:  Heparin  level 0.3-0.7 units/ml Monitor platelets by anticoagulation protocol: Yes   Plan:  Continue heparin  infusion at 1450 units/hr Monitor heparin  level, CBC, and s/sx of bleeding  daily  Thank you for allowing pharmacy to be a part of this patient's care.   Morna Breach, PharmD PGY2 Cardiology Pharmacy Resident 12/21/2023 7:06 AM

## 2023-12-21 NOTE — Consult Note (Signed)
 Reason for Consult: AKI Referring Physician: Rolan, MD  Harold Park is an 58 y.o. male has a PMH significant for persistent atrial fibrillation (s/p ablation, DCCV, biventricular heart failure, OSA on CPAP, who pesented to [facility] on 12/10/23 with worsening SOB, increased abdominal girth, and generalized malaise following DCCV the day before.  In the ED, Temp 97.5, Bp 110/88, HR 118, SpO2 80% and placed on 3 L [place].  Labs were notable for Na 132, BUN 29, Cr 1.74, WBC 13.1, Hgb 17.1, BNP 6,368, Mg 2.5.  CXR with mild pulmonary vascular congestion and stable cardiomegaly.  He was admitted with acute on chronic CHF.  He developed V tach and transferred to the ICU on 12/12/23.  ECHO with EF of <15% and severe RV hypokinesis.  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  He has continued to have worsening CHF despite milrinone  drip, lasix  drip, IV amiodarone , and 3% saline prn for persistent hyponatremia.  We have been consulted to consider CRRT in setting of AKI and refractory biventricular failure. CVP remains elevated at 18.  The trend in Scr is seen below.  He is a possible heart transplant candidate per advanced heart failure team.    He is currently lethargic but able to hold a conversation and understands about the possible need for CRRT.  He would be willing to try CRRT if/when needed as well as placement of HD catheter.  He denies any N/V but is weak.  No SOB or chest pain.  Trend in Creatinine:  Creatinine, Ser  Date/Time Value Ref Range Status  12/21/2023 04:54 AM 2.95 (H) 0.61 - 1.24 mg/dL Final  89/87/7974 97:84 PM 3.17 (H) 0.61 - 1.24 mg/dL Final  89/87/7974 94:85 AM 3.40 (H) 0.61 - 1.24 mg/dL Final  89/88/7974 97:52 PM 3.62 (H) 0.61 - 1.24 mg/dL Final  89/88/7974 97:94 PM 3.62 (H) 0.61 - 1.24 mg/dL Final  89/88/7974 94:99 AM 3.74 (H) 0.61 - 1.24 mg/dL Final  89/89/7974 97:72 PM 3.68 (H) 0.61 - 1.24 mg/dL Final  89/89/7974 94:93 AM  3.25 (H) 0.61 - 1.24 mg/dL Final  89/90/7974 96:43 PM 3.06 (H) 0.61 - 1.24 mg/dL Final  89/90/7974 87:43 PM 2.77 (H) 0.61 - 1.24 mg/dL Final  89/90/7974 95:40 AM 2.45 (H) 0.61 - 1.24 mg/dL Final  89/91/7974 90:75 PM 2.18 (H) 0.61 - 1.24 mg/dL Final  89/91/7974 94:87 AM 1.86 (H) 0.61 - 1.24 mg/dL Final  89/92/7974 94:60 PM 1.74 (H) 0.61 - 1.24 mg/dL Final  89/92/7974 95:93 AM 1.79 (H) 0.61 - 1.24 mg/dL Final  89/93/7974 89:48 PM 1.74 (H) 0.61 - 1.24 mg/dL Final  89/93/7974 98:77 PM 1.76 (H) 0.61 - 1.24 mg/dL Final  89/93/7974 95:62 AM 1.61 (H) 0.61 - 1.24 mg/dL Final  89/94/7974 97:95 PM 1.72 (H) 0.61 - 1.24 mg/dL Final  89/94/7974 94:65 AM 1.60 (H) 0.61 - 1.24 mg/dL Final  89/95/7974 89:96 PM 1.65 (H) 0.61 - 1.24 mg/dL Final  89/95/7974 94:47 PM 1.65 (H) 0.61 - 1.24 mg/dL Final  89/95/7974 90:72 AM 1.81 (H) 0.61 - 1.24 mg/dL Final  89/96/7974 93:92 PM 1.74 (H) 0.61 - 1.24 mg/dL Final  90/75/7974 89:84 AM 1.41 (H) 0.76 - 1.27 mg/dL Final  92/71/7974 91:57 AM 1.15    10/01/2021 12:07 PM 1.07    08/06/2023 03:22 PM 1.31 (H) 0.61 - 1.24 mg/dL Final  96/96/7974 98:59 PM 1.27 0.40 - 1.50 mg/dL Final  88/94/7975 88:73 AM 1.44 (H) 0.61 - 1.24 mg/dL Final  89/91/7975 97:89 PM  1.53 (H) 0.61 - 1.24 mg/dL Final  90/76/7975 88:65 AM 1.25 0.76 - 1.27 mg/dL Final  90/80/7975 88:77 AM 1.49 (H) 0.76 - 1.27 mg/dL Final  90/93/7975 98:54 PM 2.01 (H) 0.40 - 1.50 mg/dL Final  90/94/7975 89:51 AM 2.10 (H) 0.61 - 1.24 mg/dL Final  91/68/7975 94:80 AM 2.00 (H) 0.61 - 1.24 mg/dL Final  91/69/7975 97:49 AM 1.89 (H) 0.61 - 1.24 mg/dL Final  91/70/7975 95:64 AM 1.61 (H) 0.61 - 1.24 mg/dL Final  91/71/7975 95:85 AM 1.64 (H) 0.61 - 1.24 mg/dL Final  91/72/7975 95:83 AM 1.65 (H) 0.61 - 1.24 mg/dL Final  91/73/7975 94:59 AM 1.74 (H) 0.61 - 1.24 mg/dL Final  91/74/7975 96:95 AM 1.87 (H) 0.61 - 1.24 mg/dL Final  91/75/7975 97:99 PM 1.71 (H) 0.61 - 1.24 mg/dL Final  91/75/7975 95:97 AM 1.79 (H) 0.61 - 1.24 mg/dL  Final  91/76/7975 93:72 PM 2.02 (H) 0.61 - 1.24 mg/dL Final  91/76/7975 89:49 AM 1.89 (H) 0.61 - 1.24 mg/dL Final  91/76/7975 95:44 AM 1.80 (H) 0.61 - 1.24 mg/dL Final  91/77/7975 88:89 PM 1.89 (H) 0.61 - 1.24 mg/dL Final  91/77/7975 95:92 AM 2.10 (H) 0.61 - 1.24 mg/dL Final  91/78/7975 97:45 PM 2.14 (H) 0.61 - 1.24 mg/dL Final  91/78/7975 96:94 AM 2.14 (H) 0.61 - 1.24 mg/dL Final  91/79/7975 95:59 AM 2.25 (H) 0.61 - 1.24 mg/dL Final  91/80/7975 92:44 PM 2.36 (H) 0.61 - 1.24 mg/dL Final  91/80/7975 96:65 PM 2.23 (H) 0.61 - 1.24 mg/dL Final    PMH:   Past Medical History:  Diagnosis Date   Chronic bronchitis    HTN (hypertension)    NICM (nonischemic cardiomyopathy) (HCC)    Obesity (BMI 30-39.9) 12/04/2015   OSA (obstructive sleep apnea) 08/19/2015   Severe with AHI 64/hr now on CPAP at 13cm H2O   PAF (paroxysmal atrial fibrillation) (HCC)    Systolic CHF, chronic (HCC)     PSH:   Past Surgical History:  Procedure Laterality Date   APPENDECTOMY     ATRIAL FIBRILLATION ABLATION N/A 12/17/2022   Procedure: ATRIAL FIBRILLATION ABLATION;  Surgeon: Nancey [name], MD;  Location: [facility];  Service: Cardiovascular;  Laterality: N/A;   BRONCHIAL WASHINGS  04/24/2020   Procedure: BRONCHIAL WASHINGS;  Surgeon: [name], MD;  Location: [facility] ENDOSCOPY;  Service: Pulmonary;;   CARDIAC CATHETERIZATION     CARDIOVERSION N/A 06/19/2017   Procedure: CARDIOVERSION;  Surgeon: [name], MD;  Location: [facility] ENDOSCOPY;  Service: Cardiovascular;  Laterality: N/A;   CARDIOVERSION N/A 10/02/2022   Procedure: CARDIOVERSION;  Surgeon: [name], MD;  Location: [facility];  Service: Cardiovascular;  Laterality: N/A;   CARDIOVERSION N/A 10/20/2022   Procedure: CARDIOVERSION;  Surgeon: [name], MD;  Location: [facility];  Service: Cardiovascular;  Laterality: N/A;   CARDIOVERSION N/A 10/23/2022   Procedure: CARDIOVERSION;  Surgeon: [name], MD;  Location: [facility];  Service: Cardiovascular;  Laterality: N/A;   CARDIOVERSION N/A 11/13/2022   Procedure: CARDIOVERSION;  Surgeon: [name], MD;  Location: [facility];  Service: Cardiovascular;  Laterality: N/A;   CARDIOVERSION N/A 12/10/2023   Procedure: CARDIOVERSION;  Surgeon: Michele Richardson, DO;  Location: [facility];  Service: Cardiovascular;  Laterality: N/A;   FINE NEEDLE ASPIRATION  04/24/2020   Procedure: FINE NEEDLE ASPIRATION (FNA) LINEAR;  Surgeon: [name], MD;  Location: [facility] ENDOSCOPY;  Service: Pulmonary;;   LIPOMA RESECTION  arms   RIGHT HEART CATH N/A 10/30/2022   Procedure: RIGHT HEART CATH;  Surgeon: Gardenia Led, DO;  Location: [facility];  Service: Cardiovascular;  Laterality: N/A;   RIGHT HEART CATH AND CORONARY ANGIOGRAPHY N/A 12/15/2023   Procedure: RIGHT HEART CATH AND CORONARY ANGIOGRAPHY;  Surgeon: Zenaida Morene PARAS, MD;  Location: [facility];  Service: Cardiovascular;  Laterality: N/A;   SPLENECTOMY     SPLIT NIGHT STUDY  08/12/2015   VIDEO BRONCHOSCOPY WITH ENDOBRONCHIAL ULTRASOUND N/A 04/24/2020   Procedure: VIDEO BRONCHOSCOPY WITH ENDOBRONCHIAL ULTRASOUND;  Surgeon: [name], MD;  Location: [facility] ENDOSCOPY;  Service: Pulmonary;  Laterality: N/A;    Allergies:  Allergies  Allergen Reactions   Bee Venom Anaphylaxis    Medications:   Prior to Admission medications   Medication Sig Start Date End Date Taking? Authorizing Provider  albuterol  (VENTOLIN  HFA) 108 (90 Base) MCG/ACT inhaler TAKE 2 PUFFS BY MOUTH EVERY 6 HOURS AS NEEDED FOR WHEEZE OR SHORTNESS OF BREATH 02/24/22  Yes [name], [name], MD  amiodarone  (PACERONE ) 200 MG tablet Take 2 tablets (400 mg total) by mouth 2 (two) times daily. 12/02/23  Yes [name], [name], MD  apixaban  (ELIQUIS ) 5 MG TABS tablet TAKE 1 TABLET BY MOUTH TWICE A DAY 07/23/23  Yes Bensimhon, [name], MD  digoxin  (LANOXIN ) 0.125 MG tablet Take 1 tablet  (0.125 mg total) by mouth daily. 06/29/23  Yes Bensimhon, [name], MD  DULoxetine  (CYMBALTA ) 20 MG capsule TAKE 1 CAPSULE BY MOUTH EVERY DAY 12/02/22  Yes Bensimhon, [name], MD  empagliflozin  (JARDIANCE ) 10 MG TABS tablet Take 1 tablet (10 mg total) by mouth daily. 07/07/23  Yes [name], NP  Magnesium 250 MG TABS Take 250 mg by mouth every evening. Gummy   Yes [provider]  potassium chloride  SA (KLOR-CON  M) 20 MEQ tablet Take 2 tablets (40 mEq total) by mouth 2 (two) times daily. 12/03/22  Yes [name], [name], MD  spironolactone  (ALDACTONE ) 25 MG tablet Take 1 tablet (25 mg total) by mouth daily. 11/27/23  Yes Bensimhon, [name], MD  tamsulosin  (FLOMAX ) 0.4 MG CAPS capsule Take 1 capsule (0.4 mg total) by mouth daily after supper. 08/06/23  Yes Bensimhon, [name], MD  tirzepatide  (ZEPBOUND ) 12.5 MG/0.5ML Pen Inject 12.5 mg into the skin once a week. 10/28/23  Yes [name], MD  torsemide  (DEMADEX ) 20 MG tablet Take 2 tablets (40 mg total) by mouth 2 (two) times daily. 08/05/23  Yes [name], [name], MD  losartan  (COZAAR ) 25 MG tablet Take 1 tablet (25 mg total) by mouth daily. Patient not taking: Reported on 12/12/2023 08/06/23   Bensimhon, [name], MD    Inpatient medications:  calcium  citrate  200 mg of elemental calcium  Oral Daily   Chlorhexidine  Gluconate Cloth  6 each Topical Daily   cholecalciferol  1,000 Units Oral Daily   dapsone  100 mg Oral Daily   DULoxetine   20 mg Oral Daily   insulin  aspart  0-20 Units Subcutaneous TID WC   insulin  aspart  0-5 Units Subcutaneous QHS   insulin  aspart  4 Units Subcutaneous TID WC   insulin  glargine  25 Units Subcutaneous Daily   methotrexate  5 mg Oral Weekly   mexiletine  150 mg Oral Q8H   pantoprazole  40 mg Oral Daily   polyethylene glycol  17 g Oral BID   predniSONE   30 mg Oral Q breakfast   senna  2 tablet Oral Daily  sodium chloride  flush  10-40 mL Intracatheter Q12H   sodium chloride  flush  10-40 mL  Intracatheter Q12H   sodium chloride  flush  3 mL Intravenous Q12H   tamsulosin   0.4 mg Oral QPC supper   tolvaptan  15 mg Oral Once    Discontinued Meds:   Medications Discontinued During This Encounter  Medication Reason   furosemide  (LASIX ) injection 40 mg    digoxin  (LANOXIN ) tablet 0.125 mg    furosemide  (LASIX ) injection 60 mg    apixaban  (ELIQUIS ) tablet 5 mg    amiodarone  (PACERONE ) 200 MG tablet Duplicate   milrinone  (PRIMACOR ) 20 MG/100 ML (0.2 mg/mL) infusion    furosemide  (LASIX ) 200 mg in dextrose  5 % 100 mL (2 mg/mL) infusion    amiodarone  (PACERONE ) tablet 400 mg    torsemide  (DEMADEX ) tablet 40 mg    digoxin  (LANOXIN ) tablet 0.125 mg    potassium chloride  SA (KLOR-CON  M) CR tablet 40 mEq    potassium chloride  SA (KLOR-CON  M) CR tablet 40 mEq    potassium chloride  SA (KLOR-CON  M) CR tablet 40 mEq    potassium chloride  SA (KLOR-CON  M) CR tablet 40 mEq    phenylephrine  80 mcg/10 mL injection Returned to ADS   amiodarone  (NEXTERONE ) 1.5 mg/mL IV bolus only 150 mg    furosemide  (LASIX ) 200 mg in dextrose  5 % 100 mL (2 mg/mL) infusion    iohexol  (OMNIPAQUE ) 350 MG/ML injection Patient Discharge   heparin  sodium (porcine) injection Patient Discharge   Radial Cocktail/Verapamil only Patient Discharge   Heparin  (Porcine) in NaCl 1000-0.9 UT/500ML-% SOLN Patient Discharge   midazolam  (VERSED ) injection Patient Discharge   fentaNYL  (SUBLIMAZE ) injection Patient Discharge   lidocaine  (PF) (XYLOCAINE ) 1 % injection Patient Discharge   0.9 %  sodium chloride  infusion Patient Transfer   potassium chloride  SA (KLOR-CON  M) CR tablet 40 mEq    heparin  ADULT infusion 100 units/mL (25000 units/250mL)    insulin  aspart (novoLOG ) injection 0-15 Units    mycophenolate (CELLCEPT) capsule 500 mg    calcium -vitamin D (OSCAL WITH D) 500-5 MG-MCG per tablet 2 tablet    lidocaine  (XYLOCAINE ) 4 mg/mL bolus via infusion 100 mg    insulin  glargine (LANTUS) injection 10 Units    spironolactone   (ALDACTONE ) tablet 25 mg    digoxin  (LANOXIN ) tablet 0.0625 mg    lidocaine  (cardiac) 2000 mg in dextrose  5% 500 mL (4mg /mL) IV infusion    insulin  glargine (LANTUS) injection 20 Units    senna (SENOKOT) tablet 8.6 mg    norepinephrine  (LEVOPHED ) 4mg  in (0.016 mg/mL) premix infusion    sodium chloride  3% bolus via infusion 150 mL    mycophenolate (CELLCEPT) capsule 500 mg    norepinephrine  (LEVOPHED ) 4mg  in (0.016 mg/mL) premix infusion     Social History:  reports that he has never smoked. He has never used smokeless tobacco. He reports current alcohol use. He reports that he does not use drugs.  Family History:   Family History  Problem Relation Age of Onset   Breast cancer Mother    Cancer Father        blood cancer   Hypertension Other    Heart disease Other    Stomach cancer Neg Hx    Colon cancer Neg Hx    Esophageal cancer Neg Hx    Pancreatic cancer Neg Hx    Rectal cancer Neg Hx     Pertinent items are noted in HPI. Weight change: 5.2 kg  Intake/Output Summary (Last 24  hours) at 12/21/2023 0857 Last data filed at 12/21/2023 0847 Gross per 24 hour  Intake 2891.18 ml  Output 3300 ml  Net -408.82 ml   BP (!) 109/39   Pulse 84   Temp (!) 97.2 F (36.2 C) (Axillary)   Resp (!) 21   Ht 6' 5 (1.956 m)   Wt (!) 149.3 kg   SpO2 96%   BMI 39.03 kg/m  Vitals:   12/21/23 0731 12/21/23 0732 12/21/23 0733 12/21/23 0734  BP:      Pulse: 84     Resp: (!) 23 (!) 22 (!) 21 (!) 21  Temp:      TempSrc:      SpO2: 96%     Weight:      Height:         General appearance: fatigued, no distress, moderately obese, and slowed mentation Head: Normocephalic, without obvious abnormality, atraumatic Resp: clear to auscultation bilaterally Cardio: regular rate and rhythm, S1, S2 normal, no murmur, click, rub or gallop GI: soft, non-tender; bowel sounds normal; no masses,  no organomegaly Extremities: Unna boots in place, no presacral edema  Labs: Basic  Metabolic Panel: Recent Labs  Lab 12/14/23 2251 12/15/23 0406 12/16/23 0949 12/16/23 2124 12/19/23 0500 12/19/23 1405 12/19/23 1447 12/20/23 0514 12/20/23 1415 12/21/23 0454 12/21/23 0810  NA 126*   < >  --    < > 115* 115* 115* 114* 119* 120* 120*  K 3.5   < >  --    < > 4.8 4.5 4.4 4.4 4.0 3.8 4.0  CL 84*   < >  --    < > 84* 84* 85* 87* 89* 88* 90*  CO2 22   < >  --    < > 15* 17* 15* 15* 17* 17* 15*  GLUCOSE 396*   < >  --    < > 252* 271* 221* 204* 190* 254* 161*  BUN 29*   < >  --    < > 74* 77* 78* 83* 83* 78* 81*  CREATININE 1.74*   < >  --    < > 3.74* 3.62* 3.62* 3.40* 3.17* 2.95* 2.89*  ALBUMIN  2.8*  --  2.6*  --   --  2.9* 3.0*  --   --   --  3.1*  CALCIUM  7.6*   < >  --    < > 8.5* 8.1* 8.2* 8.2* 7.8* 7.7* 8.2*  PHOS  --   --   --   --  6.3*  --   --   --   --   --   --    < > = values in this interval not displayed.   Liver Function Tests: Recent Labs  Lab 12/16/23 0949 12/19/23 1405 12/19/23 1447  AST 36 36 34  ALT 55* 49* 52*  ALKPHOS 49 56 56  BILITOT 0.8 1.3* 1.2  PROT 5.5* 5.7* 5.6*  ALBUMIN  2.6* 2.9* 3.0*   No results for input(s): LIPASE, AMYLASE in the last 168 hours. No results for input(s): AMMONIA in the last 168 hours. CBC: Recent Labs  Lab 12/16/23 0949 12/18/23 0506 12/19/23 0500 12/20/23 0514 12/21/23 0454  WBC 11.9* 16.5* 16.2* 18.4* 15.2*  NEUTROABS 11.1*  --   --   --   --   HGB 14.4  12.7* 14.4 15.7 15.9 15.0  HCT 41.9 40.5 44.5 44.8 42.4  MCV 85.3 82.8 81.4 81.0 82.5  PLT 198 183 183 179 143*   PT/INR: @  LABRCNTIP(inr:5) Cardiac Enzymes: )No results for input(s): CKTOTAL, CKMB, CKMBINDEX, TROPONINI in the last 168 hours. CBG: Recent Labs  Lab 12/20/23 0717 12/20/23 1118 12/20/23 1643 12/20/23 2153 12/21/23 0638  GLUCAP 197* 170* 160* 123* 145*    Iron Studies: No results for input(s): IRON, TIBC, TRANSFERRIN, FERRITIN in the last 168 hours.  Xrays/Other Studies: No results  found.   Assessment/Plan:  AKI/CKD stage IIIa - in setting of biventricular failure and ongoing diuresis +/- IV contrast for heart cath on 12/15/23.  He is not responding well to maximal medical therapy, although his Na and Scr have slightly improved.  Will likely benefit from CRRT to help manage his volume and hyponatremia. PCCM has been notified about possible need for HD catheter placement today and initiation of CRRT.  I did discuss the procedure with the patient as well as HD catheter placement and he would be willing to proceed with CRRT. Avoid nephrotoxic medications including NSAIDs and iodinated intravenous contrast exposure unless the latter is absolutely indicated.   Preferred narcotic agents for pain control are hydromorphone, fentanyl , and methadone. Morphine should not be used.  Avoid Baclofen and avoid oral sodium phosphate  and magnesium citrate based laxatives / bowel preps.  Continue strict Input and Output monitoring. Will monitor the patient closely with you and intervene or adjust therapy as indicated by changes in clinical status/labs  Acute on chronic biventricular heart failure - normal cardiac cath on 12/15/23.  CVP remains elevated.  Currently on IV lasix  gtt at 40 mg/hr, milrinone , levophed , and vasopressin.  UOP up to 2550 overnight.  Continue with current regimen but will likely require CRRT in the next 24 hours.  GDMT limited by hypotension. 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  Started on pulse steroids now on methotrexate and prednisone .  Plan per advanced heart failure team. Persistent atrial fibrillation - despite ablation and DCCV.  Currently on IV amiodarone .  EP following. H/o V tach - lidocaine  gtt stopped 12/17/23.  Now on mexiletine.  EP following. OSA - continue with CPAP Hypervolemic hyponatremia - also with AKI.  Has received 3% saline but Na stable at 120.  Will likely improve once CRRT is initiated.   [facility] LABOR Alva Kuenzel 12/21/2023,  8:57 AM    The patient is critically ill with acute on chronic biventricular heart failure and which includes my role to primarily manage hyponatremia, AKI, and refractory volume overload.  This requires high complexity decision making.  Total critical care time: 30 minutes    Critical care time was exclusive of treating other patients.   Critical care was necessary to treat or prevent imminent or life-threatening deterioration.   Critical care was time spent personally by me on the following activities:   development of treatment plan with patient and/or surrogate as well as nursing,   discussions with other provider evaluation of patient's response to treatment  examination of patient  obtaining history from patient or surrogate  ordering and performing treatments and interventions  ordering and review of laboratory studies  ordering and review of radiographic studies

## 2023-12-21 NOTE — Progress Notes (Signed)
 CalcQx Fick CO/CI 6/2.1. Numbers have gotten progressively lower from last week.   Now on NE at 18 and vaso 0.04.   Discussed with Dr. Zenaida, with AKI and lower CI, will stop milrinone  and start DBA at 5. Check co-ox 2 hours post start.   Beckey LITTIE Coe AGACNP-BC Advanced Heart Failure Team  12/21/23

## 2023-12-21 NOTE — Progress Notes (Addendum)
 Advanced Heart Failure Rounding Note  Cardiologist: Wilbert Bihari, MD  Chief Complaint: A/C HF Subjective:   10/6 Started on Milirnone 0.125 for low output.  Developed VTwith shock x1. Sleeping at the time but described some chest burning. Lidocaine  added. K supplemented.  10/7 Cath - coronaries ok. Elevated filling pressures, CI 1.7. Milrinone  increased to 0.25 mcg. Started on pulse steroids for possible sarcoid  On milrinone  0.125, NE at 17 and vaso at 0.04.   On lasix  gtt at 40 and s/p 3% hypertonic saline yesterday. Weight up 12 lbs. -2.5 L UOP. CVP 15  Feels ok just very tired. Sitting up in chair.   Objective:   Weight Range: (!) 149.3 kg Body mass index is 39.03 kg/m.   Vital Signs:   Temp:  [97.2 F (36.2 C)] 97.2 F (36.2 C) (10/12 1100) Pulse Rate:  [77-214] 84 (10/13 0445) Resp:  [13-32] 17 (10/13 0700) SpO2:  [79 %-98 %] 93 % (10/13 0445) Arterial Line BP: (70-148)/(43-127) 94/64 (10/13 0700) Weight:  [149.3 kg] 149.3 kg (10/13 0602) Last BM Date : 12/19/23  Weight change: Filed Weights   12/19/23 0500 12/20/23 0500 12/21/23 0602  Weight: (!) 136.8 kg (!) 144.1 kg (!) 149.3 kg    Intake/Output:   Intake/Output Summary (Last 24 hours) at 12/21/2023 0715 Last data filed at 12/21/2023 0700 Gross per 24 hour  Intake 3112.98 ml  Output 2550 ml  Net 562.98 ml     CVP 15 Physical Exam  General: pale, chronically ill / fatigued appearing.  No respiratory difficulty Neck: JVD ~14 cm.  Cor: Regular rate & rhythm. Lungs: clear, diminished bases Extremities: +1 BLE, + UNNA boots Neuro: alert & oriented x 3. Very tired.   Telemetry   NSR 80s, intt PVCs (Personally reviewed)    Labs    CBC Recent Labs    12/20/23 0514 12/21/23 0454  WBC 18.4* 15.2*  HGB 15.9 15.0  HCT 44.8 42.4  MCV 81.0 82.5  PLT 179 143*   Basic Metabolic Panel Recent Labs    89/88/74 0500 12/19/23 1405 12/20/23 0514 12/20/23 1415 12/21/23 0454  NA 115*   < > 114*  119* 120*  K 4.8   < > 4.4 4.0 3.8  CL 84*   < > 87* 89* 88*  CO2 15*   < > 15* 17* 17*  GLUCOSE 252*   < > 204* 190* 254*  BUN 74*   < > 83* 83* 78*  CREATININE 3.74*   < > 3.40* 3.17* 2.95*  CALCIUM  8.5*   < > 8.2* 7.8* 7.7*  MG 2.5*  --  2.5*  --  2.6*  PHOS 6.3*  --   --   --   --    < > = values in this interval not displayed.   Liver Function Tests Recent Labs    12/19/23 1405 12/19/23 1447  AST 36 34  ALT 49* 52*  ALKPHOS 56 56  BILITOT 1.3* 1.2  PROT 5.7* 5.6*  ALBUMIN  2.9* 3.0*    Medications:     Scheduled Medications:  calcium  citrate  200 mg of elemental calcium  Oral Daily   Chlorhexidine  Gluconate Cloth  6 each Topical Daily   cholecalciferol  1,000 Units Oral Daily   dapsone  100 mg Oral Daily   DULoxetine   20 mg Oral Daily   insulin  aspart  0-20 Units Subcutaneous TID WC   insulin  aspart  0-5 Units Subcutaneous QHS   insulin  aspart  4  Units Subcutaneous TID WC   insulin  glargine  25 Units Subcutaneous Daily   methotrexate  5 mg Oral Weekly   mexiletine  150 mg Oral Q8H   pantoprazole  40 mg Oral Daily   polyethylene glycol  17 g Oral BID   potassium chloride   20 mEq Oral Once   predniSONE   30 mg Oral Q breakfast   senna  2 tablet Oral Daily   sodium chloride  flush  10-40 mL Intracatheter Q12H   sodium chloride  flush  10-40 mL Intracatheter Q12H   sodium chloride  flush  3 mL Intravenous Q12H   tamsulosin   0.4 mg Oral QPC supper    Infusions:  amiodarone  30 mg/hr (12/21/23 0700)   furosemide  (LASIX ) 200 mg in dextrose  5 % 100 mL (2 mg/mL) infusion 40 mg/hr (12/21/23 0700)   heparin  1,450 Units/hr (12/21/23 0700)   milrinone  0.125 mcg/kg/min (12/21/23 0700)   norepinephrine  (LEVOPHED ) Adult infusion 17 mcg/min (12/21/23 0700)   vasopressin 0.04 Units/min (12/21/23 0700)    PRN Medications: acetaminophen  **OR** acetaminophen , oxyCODONE , prochlorperazine, sodium chloride  flush, sodium chloride  flush Patient Profile  58 year old with a history  of HFrEF, PAF, OSA, VT, CKD Stage IIIa.   HF following for cardiogenic shock.  Assessment/Plan   A/C HFrEF, Cardiogenic Shock  - Normal coronary arteriography 12/2023 - Cardiac PET 2023- no evidence for cardiac sarcoid or inflammatory. LVEF 38%. - cMRI 2/25 EF 28 % RV 33% LGE pattern concerning for sarcoid.  Has hilar adenopathy on lung imaging.  - Echo 12/12/23 EF < 15 severe RV HK - Coox 64% - Appears to be doing better from a VT standpoint, may be able to increase milrinone  - Remains on NE as well as vasopressin for distributive shock - WBC elevated, but in the setting of steroids - Arterial line for BP monitoring - Continue IV lasix  gtt at 40mg /hour - Continue lasix  gtt at 40 mg/hr. Will discuss with MD need for 3% again today vs Tolvaptan.  - GDMT limited by hypotension.  - Hold spiro and digoxin  with elevated SCr.    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 - underwent DC-CV on 12/10/23 - Maintaining SR. Continue IV amio. Of note he has previously had difficulty taking amio due to nausea.  - Continue heparin   - EP following. Potential AFL ablation (versus AVN/pacer) + ICD this admit. He is not at all optimized for this procedure   VT -S/P DC-CV 10/4  -S/P DC- CV 10/6  - Continue amio drip  - Lidocaine  gtt stopped 10/9. Now on mexiletine.  - EP following.  - Has been electrically quiet since start of steroids  Possible Sarcoid -Consulted EP. -Now on Sarcoid Protocol. Giving pulse steroids x 3 days and on immunosuppression with methotrexate and prednisone  - No further VT - Check labs. At this point will hold off on vaccines.   AKI on CKD Stage IIIa  -Creatinine baseline ~ 1.6-1.8 - Creatinine slowly downtrending, but urine output still poor.  - Consider CRRT/nephrology consult - Avoid hypotension - Continue high dose diuretics - Low urine sodium, bland urine - Foley placement - Unna boots  OSA  -Continue CPAP  Hyponatremia  - Na 120, corrected at 124 - 3% vs  Tolvaptan today - Follow  CRITICAL CARE Performed by: Beckey LITTIE Coe   Total critical care time: 15 minutes  Critical care time was exclusive of separately billable procedures and treating other patients.  Critical care was necessary to treat or prevent imminent or life-threatening deterioration.  Critical care was time spent personally by me on the following activities: development of treatment plan with patient and/or surrogate as well as nursing, discussions with consultants, evaluation of patient's response to treatment, examination of patient, obtaining history from patient or surrogate, ordering and performing treatments and interventions, ordering and review of laboratory studies, ordering and review of radiographic studies, pulse oximetry and re-evaluation of patient's condition.  Length of Stay: 9  Beckey LITTIE Coe, NP  12/21/2023, 7:15 AM  Advanced Heart Failure Team Pager [phone] (M-F; 7a - 5p)  Please contact [facility] for night-coverage after hours (5p -7a ) and weekends on [email or web address]  Patient seen with NP, I formulated the plan agree with the above note.   Creatinine 3.17 => 2.95 today with UOP 2550 cc but still Is > Os.  On milrinone  0.125 + NE 17 + vasopressin 0.04 with co-ox 64%.  Na remains 119 => 120, corrected sodium around 124.  He is on Lasix  gtt 40 mg/hr + 3% saline 200 cc q8 hrs.  CVP remains 17.   He remains in NSR with 1st degree AVB and occasional PVCs on amiodarone  30 mg/hr and mexiletine.   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   Tired/lethargic this morning.  Sitting in chair.   General: NAD Neck: JVP 16 cm, no thyromegaly or thyroid  nodule.  Lungs: Clear to auscultation bilaterally with normal respiratory effort. CV: Nondisplaced PMI.  Heart regular S1/S2, no S3/S4, no murmur.  1+ edema to knees.   Abdomen: Soft, nontender, no hepatosplenomegaly, no distention.  Skin: Intact without lesions or rashes.   Neurologic: Sleepy but wakes up and answers questions Extremities: No clubbing or cyanosis.  HEENT: Normal.   Multiple episodes of VT initially, now on amiodarone  gtt 30 mg/hr + mexiletine.  Improved since started on steroids.   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  Cardiac PET in 6/23 did not show active inflammation.  He is now being treated empirically with prednisone  30 mg daily + methotrexate 5 mg weekly, ventricular rhythm improved.   Cardiogenic shock with AKI.  Co-ox 64% this morning on milrinone  0.125, NE 17, vasopressin 0.04.  He is markedly volume overloaded with CVP 17, not making much progress with diuresis.  2550 cc UOP but Is>Os.  Creatinine 3.17 => 2.98.  - Continue Lasix  40 mg/hr.  - Continue 3% saline 200 cc q8 hrs.  - Add tolvaptan 15 mg x 1.  - If UOP does not pick up this morning, would favor CVVH for volume removal.  Hopefully could wean off after volume corrected.  Will consult nephrology.   Hypervolemic hyponatremia has been resistant to 3% saline.  Na 120 today, corrected 124 for glucose.  - Repeat 3% saline 200 cc q8 hrs as above. Have been doing this for several days.  - Will give tolvaptan.   Ultimately, I think he is going to need to be considered for heart transplantation but need to stabilize him first.   CRITICAL CARE Performed by: Ezra Shuck  Total critical care time: 45 minutes  Critical care time was exclusive of separately billable procedures and treating other patients.  Critical care was necessary to treat or prevent imminent or life-threatening deterioration.  Critical care was time spent personally by me on the following activities: development of treatment plan with patient and/or surrogate as well as nursing, discussions with consultants, evaluation of patient's response to treatment, examination  of patient,  obtaining history from patient or surrogate, ordering and performing treatments and interventions, ordering and review of laboratory studies, ordering and review of radiographic studies, pulse oximetry and re-evaluation of patient's condition.  Ezra Shuck 12/21/2023 8:00 AM

## 2023-12-21 NOTE — Progress Notes (Signed)
 Initial Nutrition Assessment  DOCUMENTATION CODES:   Not applicable  INTERVENTION:   Recommend liberalizing diet to Regular while on CRRT with poor po intake; continue fluid restriction  Ensure Plus High Protein po TID, each supplement provides 350 kcal and 20 grams of protein  30 ml ProSource Plus BID, each supplement provides 100 kcals and 15 grams protein.   Add Renal MVI daily   NUTRITION DIAGNOSIS:   Increased nutrient needs related to acute illness as evidenced by estimated needs.  GOAL:   Patient will meet greater than or equal to 90% of their needs   MONITOR:   PO intake, Supplement acceptance, Labs, Weight trends, I & O's  REASON FOR ASSESSMENT:   Consult Assessment of nutrition requirement/status  ASSESSMENT:   58 yo male admitted with decompensated HF, A.fib with RVR and a flutter requiring multiple DCCV, +concern for cardiac sarcoidosis. Pt with +Cardiogenic shock, worsening AKI  requiring CRRT. PMH includes HTN, NICM, CHF, OSA, PAF  10/04 Admitted, Echo EF <15%, DC-CV 10/05 Started in milrinone  for low output; VT with shock x 1 10/07 Started on steroids for possible sarcoidosis 10/10 Levophed  initiated  Pt lethargic on visit today, arousable but falls back asleep quickly. Able to obtain limited nutrition hx at this time Levophed  at 17 Vasopressin 0.04 Lasix  gtt  Nephrology consulted for possible CRRT in setting of AKI with refractory biventricular HF CVP 18 UOP 2.6 L in 24 hours, 1.7 L thus far today  Noted concern for sarcoidosis, plan for PET CT  Recorded po intake 10% of meals. Only 2 meals documented since admission however. Pt reports he was eating well up until around Wed/Thurs last week, appetite started to decline, worsened over the weekend.   Weight trending up; current wt 149.3 kg (329 pounds). Lowest wt this admission 129.4/132.1 kg. Pt reports UBW around 300 pounds, also acknowledges wt loss PTA but unable to clarify how much at this  time. Pt confirms he is 6'5 tall.   Noted hyponatremia slowly improving, sodium 120 currently, as low as 113 on 10/10  Last BM documented 10/06 (large type 6). Pt with scheduled Miralax , Senna. Pt indicates to RN today that he had BM yesterday.   Lab Results  Component Value Date   HGBA1C 5.5 12/14/2023    Labs: Sodium 120 (L)-noted 113 on 10/10 Potassium 4.0 (wdl) Magnesium 2.6 (H) BUN 81 Creatinine 2.89 CBGs 123-197 (goal 140-180) Phosphorus 6.3 (H) on 10/11  Meds:  Vit D3 Lasix  gtt SS novolog  Novolog  with meals Lantus Methotrexate Prednisone  Senna Protonix Miralax  BID Tolvaptan x 1  NUTRITION - FOCUSED PHYSICAL EXAM:  Flowsheet Row Most Recent Value  Orbital Region Moderate depletion  Upper Arm Region Unable to assess  Thoracic and Lumbar Region Unable to assess  [edema]  Buccal Region Moderate depletion  Temple Region Moderate depletion  Clavicle Bone Region Mild depletion  Clavicle and Acromion Bone Region Mild depletion  Scapular Bone Region Mild depletion  Dorsal Hand Unable to assess  Patellar Region Mild depletion  Anterior Thigh Region Mild depletion  Posterior Calf Region Unable to assess  Edema (RD Assessment) Moderate  Hair Reviewed  Eyes Unable to assess  Mouth Unable to assess  Skin Reviewed  Nails Reviewed    Diet Order:   Diet Order             Diet heart healthy/carb modified Room service appropriate? Yes; Fluid consistency: Thin; Fluid restriction: 1800 mL Fluid  Diet effective now  EDUCATION NEEDS:   Not appropriate for education at this time  Skin:  Skin Assessment: Reviewed RN Assessment  Last BM:  10/6  Height:   Ht Readings from Last 1 Encounters:  12/12/23 6' 5 (1.956 m)    Weight:   Wt Readings from Last 1 Encounters:  12/21/23 (!) 149.3 kg      BMI:  Body mass index is 39.03 kg/m.  Estimated Nutritional Needs:   Kcal:  2300-2500 kcals  Protein:  140-170 g  Fluid:  1.8  L   Betsey Finger MS, RDN, LDN, CNSC Registered Dietitian 3 Clinical Nutrition RD Inpatient Contact Info in Amion

## 2023-12-21 NOTE — Progress Notes (Signed)
 Orthopedic Tech Progress Note Patient Details:  Harold Park May 02, 1965 [id number]  Ortho Devices Type of Ortho Device: Ace wrap, Unna boot Ortho Device/Splint Location: BLE Ortho Device/Splint Interventions: Ordered, Application, Adjustment   Post Interventions Patient Tolerated: Well Instructions Provided: Care of device  [name] Pac 12/21/2023, 6:58 PM

## 2023-12-21 NOTE — Procedures (Signed)
 Central Venous Catheter Insertion Procedure Note  Harold Park  [id number]  03-Jul-1965  Date:12/21/23  Time:12:07 PM   Provider Performing:Harold Park   Procedure: Insertion of Non-tunneled Central Venous Catheter(36556)with US  guidance (23062)    Indication(s) Difficult access  Consent Risks of the procedure as well as the alternatives and risks of each were explained to the patient and/or caregiver.  Consent for the procedure was obtained and is signed in the bedside chart  Anesthesia Topical only with 1% lidocaine    Timeout Verified patient identification, verified procedure, site/side was marked, verified correct patient position, special equipment/implants available, medications/allergies/relevant history reviewed, required imaging and test results available.  Sterile Technique Maximal sterile technique including full sterile barrier drape, hand hygiene, sterile gown, sterile gloves, mask, hair covering, sterile ultrasound probe cover (if used).  Procedure Description Area of catheter insertion was cleaned with chlorhexidine  and draped in sterile fashion.   With real-time ultrasound guidance a HD catheter was placed into the right internal jugular vein.  Nonpulsatile blood flow and easy flushing noted in all ports.  The catheter was sutured in place and sterile dressing applied.  Complications/Tolerance None; patient tolerated the procedure well. Chest X-ray is ordered to verify placement for internal jugular or subclavian cannulation.  Chest x-ray is not ordered for femoral cannulation.  EBL Minimal  Specimen(s) None

## 2023-12-21 NOTE — Progress Notes (Signed)
  Patient Name: Harold Park Date of Encounter: 12/21/2023  Primary Cardiologist: Wilbert Bihari, MD Electrophysiologist: Eulas FORBES Furbish, MD  Interval Summary   Pt remains on milrinone  0.125mcg/kg/min, NE 17mcg, vasopressin 0.04 units/min, amiodarone  30 mg/hr, lasix  gtt 40mg /hr  Up to chair. Pt reports he rested well overnight.  UOP 2.5L / +525ml in last 24h   Vital Signs    Vitals:   12/21/23 0430 12/21/23 0445 12/21/23 0602 12/21/23 0615  BP:      Pulse: 84 84    Resp: 18 17  18   Temp:      TempSrc:      SpO2: 93% 93%    Weight:   (!) 149.3 kg   Height:        Intake/Output Summary (Last 24 hours) at 12/21/2023 9362 Last data filed at 12/21/2023 0600 Gross per 24 hour  Intake 3423.2 ml  Output 2930 ml  Net 493.2 ml   Filed Weights   12/19/23 0500 12/20/23 0500 12/21/23 0602  Weight: (!) 136.8 kg (!) 144.1 kg (!) 149.3 kg    Physical Exam    GEN- ill appearing male, up to chair in NAD, alert / oriented to place / self / year Lungs- Clear to ausculation bilaterally, normal work of breathing Cardiac- Regular rate and rhythm with frequent ectopy / PVC's, no murmurs, rubs or gallops GI- soft, NT, ND, + BS Extremities- no clubbing or cyanosis. LE's wrapped   Telemetry    SR 80's with frequent PVC's (personally reviewed)  Hospital Course    Jarrah Babich is a 58 y.o. male with PMH of HFrEF, PAF, OSA, VT, CKD Stage IIIa admitted with SOB, weight gain. Found to have decompensated HF / cardiogenic shock, AFwRVR / AFL s/p DCCV 12/10/23 (200jx1 > ST).  Pt had VT with DCCV on 10/4 (WCT 230bpm, 200j shock > ST), 10/6.  Required lidocaine  infusion > transitioned to mexiletine. Pre-existing concerns for possible cardiac sarcoidosis.  He was started on sarcoid protocol with steroids & methotrexate.  No further VT after steroids initiated.   Assessment & Plan    VT  Cardiogenic Shock  Possible Cardiac Sarcoidosis  LVEF <10% -steroids / methotrexate 5mg  weekly per  Advanced HF team  -mexiletine 150 mg Q8 -s/p DCCV on 10/2 as outpt as above -no VT on tele post steroids, brief NSVT -continue amiodarone  gtt  -?transplant candidacy > defer to AHF Team  -will need secondary prevention ICD if not transferred for transplant eval, +/- AV node ablation -consider cPET as outpatient  AFwRVR  AFL s/p ablation 12/2022 -maintaining SR currently with frequent PVC's  -amio as above   CKD IIIa -Trend BMP / urinary output -Replace electrolytes as indicated -Avoid nephrotoxic agents, ensure adequate renal perfusion  OSA  -CPAP       For questions or updates, please contact [facility] Please consult [email or web address] for contact info under     Signed, Daphne Barrack, NP-C, AGACNP-BC [facility] - Electrophysiology  12/21/2023, 7:42 AM

## 2023-12-22 DIAGNOSIS — I5023 Acute on chronic systolic (congestive) heart failure: Secondary | ICD-10-CM | POA: Diagnosis not present

## 2023-12-22 LAB — MAGNESIUM: Magnesium: 2.7 mg/dL — ABNORMAL HIGH (ref 1.7–2.4)

## 2023-12-22 LAB — COOXEMETRY PANEL
Carboxyhemoglobin: 1.7 % — ABNORMAL HIGH (ref 0.5–1.5)
Carboxyhemoglobin: 2 % — ABNORMAL HIGH (ref 0.5–1.5)
Methemoglobin: 1 % (ref 0.0–1.5)
Methemoglobin: <0.7 % (ref 0.0–1.5)
O2 Saturation: 65.4 %
O2 Saturation: 58.2 %
Total hemoglobin: 16.4 g/dL — ABNORMAL HIGH (ref 12.0–16.0)
Total hemoglobin: 16.2 g/dL — ABNORMAL HIGH (ref 12.0–16.0)

## 2023-12-22 LAB — RENAL FUNCTION PANEL
Albumin: 3 g/dL — ABNORMAL LOW (ref 3.5–5.0)
Albumin: 3 g/dL — ABNORMAL LOW (ref 3.5–5.0)
Anion gap: 12 (ref 5–15)
Anion gap: 11 (ref 5–15)
BUN: 50 mg/dL — ABNORMAL HIGH (ref 6–20)
BUN: 43 mg/dL — ABNORMAL HIGH (ref 6–20)
CO2: 18 mmol/L — ABNORMAL LOW (ref 22–32)
CO2: 18 mmol/L — ABNORMAL LOW (ref 22–32)
Calcium: 8.1 mg/dL — ABNORMAL LOW (ref 8.9–10.3)
Calcium: 8 mg/dL — ABNORMAL LOW (ref 8.9–10.3)
Chloride: 99 mmol/L (ref 98–111)
Chloride: 100 mmol/L (ref 98–111)
Creatinine, Ser: 1.93 mg/dL — ABNORMAL HIGH (ref 0.61–1.24)
Creatinine, Ser: 1.86 mg/dL — ABNORMAL HIGH (ref 0.61–1.24)
GFR, Estimated: 39 mL/min — ABNORMAL LOW (ref >60)
GFR, Estimated: 41 mL/min — ABNORMAL LOW (ref >60)
Glucose, Bld: 129 mg/dL — ABNORMAL HIGH (ref 70–99)
Glucose, Bld: 102 mg/dL — ABNORMAL HIGH (ref 70–99)
Phosphorus: 3.8 mg/dL (ref 2.5–4.6)
Phosphorus: 3.5 mg/dL (ref 2.5–4.6)
Potassium: 3.8 mmol/L (ref 3.5–5.1)
Potassium: 4.1 mmol/L (ref 3.5–5.1)
Sodium: 129 mmol/L — ABNORMAL LOW (ref 135–145)
Sodium: 129 mmol/L — ABNORMAL LOW (ref 135–145)

## 2023-12-22 LAB — CBC
HCT: 47.1 % (ref 39.0–52.0)
Hemoglobin: 16.8 g/dL (ref 13.0–17.0)
MCH: 29 pg (ref 26.0–34.0)
MCHC: 35.7 g/dL (ref 30.0–36.0)
MCV: 81.3 fL (ref 80.0–100.0)
Platelets: 140 K/uL — ABNORMAL LOW (ref 150–400)
RBC: 5.79 MIL/uL (ref 4.22–5.81)
RDW: 14.8 % (ref 11.5–15.5)
WBC: 14.9 K/uL — ABNORMAL HIGH (ref 4.0–10.5)
nRBC: 4.4 % — ABNORMAL HIGH (ref 0.0–0.2)

## 2023-12-22 LAB — GLUCOSE, CAPILLARY
Glucose-Capillary: 118 mg/dL — ABNORMAL HIGH (ref 70–99)
Glucose-Capillary: 128 mg/dL — ABNORMAL HIGH (ref 70–99)
Glucose-Capillary: 134 mg/dL — ABNORMAL HIGH (ref 70–99)
Glucose-Capillary: 106 mg/dL — ABNORMAL HIGH (ref 70–99)
Glucose-Capillary: 66 mg/dL — ABNORMAL LOW (ref 70–99)
Glucose-Capillary: 90 mg/dL (ref 70–99)

## 2023-12-22 LAB — HEPARIN LEVEL (UNFRACTIONATED): Heparin Unfractionated: 0.4 [IU]/mL (ref 0.30–0.70)

## 2023-12-22 MED ORDER — POTASSIUM CHLORIDE CRYS ER 20 MEQ PO TBCR
20.0000 meq | EXTENDED_RELEASE_TABLET | Freq: Once | ORAL | Status: AC
  Administered 2023-12-22: 20 meq via ORAL
  Filled 2023-12-22: qty 1

## 2023-12-22 MED ORDER — MELATONIN 3 MG PO TABS
3.0000 mg | ORAL_TABLET | Freq: Every day | ORAL | Status: DC
  Administered 2023-12-22 – 2023-12-23 (×2): 3 mg via ORAL
  Filled 2023-12-22 (×2): qty 1

## 2023-12-22 MED ORDER — TRAZODONE HCL 50 MG PO TABS
50.0000 mg | ORAL_TABLET | Freq: Once | ORAL | Status: AC
  Administered 2023-12-22: 50 mg via ORAL
  Filled 2023-12-22: qty 1

## 2023-12-22 NOTE — Plan of Care (Signed)
  Problem: Education: Goal: Knowledge of General Education information will improve Description: Including pain rating scale, medication(s)/side effects and non-pharmacologic comfort measures Outcome: Progressing   Problem: Health Behavior/Discharge Planning: Goal: Ability to manage health-related needs will improve Outcome: Progressing   Problem: Clinical Measurements: Goal: Ability to maintain clinical measurements within normal limits will improve Outcome: Progressing Goal: Will remain free from infection Outcome: Progressing Goal: Diagnostic test results will improve Outcome: Progressing Goal: Respiratory complications will improve Outcome: Progressing Goal: Cardiovascular complication will be avoided Outcome: Progressing   Problem: Activity: Goal: Risk for activity intolerance will decrease Outcome: Progressing   Problem: Nutrition: Goal: Adequate nutrition will be maintained Outcome: Progressing   Problem: Coping: Goal: Level of anxiety will decrease Outcome: Progressing   Problem: Elimination: Goal: Will not experience complications related to bowel motility Outcome: Progressing Goal: Will not experience complications related to urinary retention Outcome: Progressing   Problem: Pain Managment: Goal: General experience of comfort will improve and/or be controlled Outcome: Progressing   Problem: Safety: Goal: Ability to remain free from injury will improve Outcome: Progressing   Problem: Skin Integrity: Goal: Risk for impaired skin integrity will decrease Outcome: Progressing   Problem: Education: Goal: Ability to demonstrate management of disease process will improve Outcome: Progressing Goal: Ability to verbalize understanding of medication therapies will improve Outcome: Progressing   Problem: Activity: Goal: Capacity to carry out activities will improve Outcome: Progressing   Problem: Cardiac: Goal: Ability to achieve and maintain adequate  cardiopulmonary perfusion will improve Outcome: Progressing   Problem: Education: Goal: Ability to describe self-care measures that may prevent or decrease complications (Diabetes Survival Skills Education) will improve Outcome: Progressing   Problem: Coping: Goal: Ability to adjust to condition or change in health will improve Outcome: Progressing   Problem: Fluid Volume: Goal: Ability to maintain a balanced intake and output will improve Outcome: Progressing   Problem: Health Behavior/Discharge Planning: Goal: Ability to identify and utilize available resources and services will improve Outcome: Progressing Goal: Ability to manage health-related needs will improve Outcome: Progressing   Problem: Metabolic: Goal: Ability to maintain appropriate glucose levels will improve Outcome: Progressing   Problem: Nutritional: Goal: Maintenance of adequate nutrition will improve Outcome: Progressing Goal: Progress toward achieving an optimal weight will improve Outcome: Progressing   Problem: Skin Integrity: Goal: Risk for impaired skin integrity will decrease Outcome: Progressing   Problem: Tissue Perfusion: Goal: Adequacy of tissue perfusion will improve Outcome: Progressing   Problem: Education: Goal: Understanding of CV disease, CV risk reduction, and recovery process will improve Outcome: Progressing   Problem: Activity: Goal: Ability to return to baseline activity level will improve Outcome: Progressing   Problem: Cardiovascular: Goal: Ability to achieve and maintain adequate cardiovascular perfusion will improve Outcome: Progressing Goal: Vascular access site(s) Level 0-1 will be maintained Outcome: Progressing   Problem: Health Behavior/Discharge Planning: Goal: Ability to safely manage health-related needs after discharge will improve Outcome: Progressing

## 2023-12-22 NOTE — TOC Progression Note (Addendum)
 Transition of Care [facility]) - Progression Note    Patient Details  Name: Harold Park MRN: [id number] Date of Birth: 1965/12/15  Transition of [facility] CM/SW Contact  Justina Delcia Czar, RN Phone Number: [phone] 12/22/2023, 4:11 PM  Clinical Narrative:    Spoke to wife at bedside. States she plans to start disability. He tried in the past and was denied.   Chart reviewed for discharge readiness started on CRRT, patient not medically stable for d/c.   CIR is following for IP rehab.   Inpatient CM/CSW will continue to monitor pt's advancement through interdisciplinary progression rounds. If new pt transition needs arise, MD please place a TOC consult.    Expected Discharge Plan: Home/Self Care Barriers to Discharge: Continued Medical Work up   Expected Discharge Plan and Services   Discharge Planning Services: CM Consult   Living arrangements for the past 2 months: Single Family Home                   Social Drivers of Health (SDOH) Interventions SDOH Screenings   Food Insecurity: No Food Insecurity (12/12/2023)  Housing: Low Risk  (12/12/2023)  Transportation Needs: No Transportation Needs (12/12/2023)  Utilities: Not At Risk (12/12/2023)  Depression (PHQ2-9): Medium Risk (03/06/2023)  Tobacco Use: Low Risk  (12/12/2023)    Readmission Risk Interventions     No data to display

## 2023-12-22 NOTE — Procedures (Signed)
 I was present at this session of CRRT. I have reviewed the session itself and made appropriate changes.   Vital signs in last 24 hours:  Temp:  [96.7 F (35.9 C)-97 F (36.1 C)] 96.9 F (36.1 C) (10/14 0700) Pulse Rate:  [78-219] 100 (10/14 0800) Resp:  [15-32] 25 (10/14 0800) SpO2:  [77 %-98 %] 96 % (10/14 0800) Arterial Line BP: (76-304)/(23-93) 93/64 (10/14 0800) Weight:  [147.1 kg] 147.1 kg (10/14 0500) Weight change: -2.2 kg Filed Weights   12/20/23 0500 12/21/23 0602 12/22/23 0500  Weight: (!) 144.1 kg (!) 149.3 kg (!) 147.1 kg    Recent Labs  Lab 12/22/23 0432  NA 129*  K 3.8  CL 99  CO2 18*  GLUCOSE 129*  BUN 50*  CREATININE 1.93*  CALCIUM  8.1*  PHOS 3.8    Recent Labs  Lab 12/16/23 0949 12/18/23 0506 12/20/23 0514 12/21/23 0454 12/21/23 1138 12/22/23 0432  WBC 11.9*   < > 18.4* 15.2*  --  14.9*  NEUTROABS 11.1*  --   --   --   --   --   HGB 14.4  12.7*   < > 15.9 15.0 17.7* 16.8  HCT 41.9   < > 44.8 42.4 52.0 47.1  MCV 85.3   < > 81.0 82.5  --  81.3  PLT 198   < > 179 143*  --  140*   < > = values in this interval not displayed.    Scheduled Meds:  (feeding supplement) PROSource Plus  30 mL Oral BID WC   calcium  citrate  200 mg of elemental calcium  Oral Daily   Chlorhexidine  Gluconate Cloth  6 each Topical Daily   cholecalciferol  1,000 Units Oral Daily   dapsone  100 mg Oral Daily   DULoxetine   20 mg Oral Daily   feeding supplement  237 mL Oral TID BM   insulin  aspart  0-20 Units Subcutaneous TID WC   insulin  aspart  0-5 Units Subcutaneous QHS   insulin  aspart  4 Units Subcutaneous TID WC   insulin  glargine  25 Units Subcutaneous Daily   methotrexate  5 mg Oral Weekly   mexiletine  150 mg Oral Q8H   multivitamin  1 tablet Oral QHS   pantoprazole  40 mg Oral Daily   polyethylene glycol  17 g Oral BID   predniSONE   30 mg Oral Q breakfast   senna  2 tablet Oral Daily   sodium chloride  flush  10-40 mL Intracatheter Q12H   sodium chloride   flush  10-40 mL Intracatheter Q12H   sodium chloride  flush  3 mL Intravenous Q12H   tamsulosin   0.4 mg Oral QPC supper   Continuous Infusions:  sodium chloride  20 mL/hr at 12/22/23 0805   amiodarone  30 mg/hr (12/22/23 0805)   DOBUTamine 5 mcg/kg/min (12/22/23 0805)   furosemide  (LASIX ) 200 mg in dextrose  5 % 100 mL (2 mg/mL) infusion 40 mg/hr (12/22/23 0805)   heparin  1,450 Units/hr (12/22/23 0805)   norepinephrine  (LEVOPHED ) Adult infusion 26 mcg/min (12/22/23 0805)   prismasol BGK 4/2.5 400 mL/hr at 12/22/23 0158   prismasol BGK 4/2.5 400 mL/hr at 12/22/23 0159   prismasol BGK 4/2.5 1,500 mL/hr at 12/22/23 9378   vasopressin 0.04 Units/min (12/22/23 0805)   PRN Meds:.acetaminophen  **OR** acetaminophen , heparin , heparin , mouth rinse, oxyCODONE , prochlorperazine, sodium chloride  flush, sodium chloride  flush    Assessment/Plan:  AKI/CKD stage IIIa - in setting of biventricular failure and ongoing diuresis +/- IV contrast for heart cath on  12/15/23.  He is not responding well to maximal medical therapy, although his Na and Scr have slightly improved.  RIJ Temp HD cath placed 12/21/23 by PCCM and initiated CRRT to help manage his volume and hyponatremia. Able to UF 4.5L with CRRT and 3.5 with UOP, CVP now 17.  All fluids 4K/2.5Ca, no heparin  since he is on systemic heparin  for cardiac issues.  UF goal 50-150 mL/hr.  Will continue with current settings.  Continue with lasix  gtt for now as well. Avoid nephrotoxic medications including NSAIDs and iodinated intravenous contrast exposure unless the latter is absolutely indicated.   Preferred narcotic agents for pain control are hydromorphone, fentanyl , and methadone. Morphine should not be used.  Avoid Baclofen and avoid oral sodium phosphate  and magnesium citrate based laxatives / bowel preps.  Continue strict Input and Output monitoring. Will monitor the patient closely with you and intervene or adjust therapy as indicated by changes in clinical  status/labs  Acute on chronic biventricular heart failure - normal cardiac cath on 12/15/23.  CVP remains elevated.  Currently on IV lasix  gtt at 40 mg/hr, milrinone , levophed , and vasopressin.  UOP up to 2550 overnight.  Continue with current regimen but will likely require CRRT in the next 24 hours.  GDMT limited by hypotension. 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  Started on pulse steroids now on methotrexate and prednisone .  Plan per advanced heart failure team. Persistent atrial fibrillation - despite ablation and DCCV.  Currently on IV amiodarone .  EP following. H/o V tach - lidocaine  gtt stopped 12/17/23.  Now on mexiletine.  EP following. OSA - continue with CPAP Hypervolemic hyponatremia - also with AKI.  Has received 3% saline but Na stable at 120.  Will likely improve once CRRT is initiated.  Harold DELENA Sellar,  MD 12/22/2023, 9:03 AM

## 2023-12-22 NOTE — Progress Notes (Signed)
 Inpatient Rehab Admissions Coordinator:   Per therapy recommendations pt was screened for CIR by Reche Lowers, PT, DPT.  At this time note just started on CRRT 10/13.  Will follow peripherally for now until closer to discharge and then assess his rehab candidacy.    Reche Lowers, PT, DPT Admissions Coordinator [phone] 12/22/23  3:04 PM

## 2023-12-22 NOTE — Evaluation (Signed)
 Physical Therapy Evaluation Patient Details Name: Harold Park MRN: [id number] DOB: 1965/10/13 Today's Date: 12/22/2023  History of Present Illness  58 y.o. male admitted 10/3 with SOB, weight gain. Found to have decompensated HF / cardiogenic shock, AFwRVR / AFL s/p DCCV 12/10/23. CRRT started 10/13. Pt had VT with DCCV on 10/4. Possible cardiac sarcoidosis. PMH: HFrEF, PAF, OSA, VT, CKD Stage IIIa.   Clinical Impression  Pt admitted with above diagnosis. Limited history, pt very lethargic and requires constant engagement to converse but still minimal. He is oriented to self and location. Able to upright unsupported in egress position and stand with mod assist +2 for about 40 seconds with RW to steady but shows heavy posterior lean. Patient MAP 70 reclined, 65 seated, and 60 in standing (although a-line not zeroed between positions.) Hopeful for good functional progress once pt more alert but favor CIR follow to monitor during acute admission for possible need. Will continue to work with pt as tolerated. Pt currently with functional limitations due to the deficits listed below (see PT Problem List). Pt will benefit from acute skilled PT to increase their independence and safety with mobility to allow discharge.           If plan is discharge home, recommend the following: Two people to help with walking and/or transfers;Two people to help with bathing/dressing/bathroom;Assistance with cooking/housework;Direct supervision/assist for medications management;Direct supervision/assist for financial management;Assist for transportation;Help with stairs or ramp for entrance;Supervision due to cognitive status   Can travel by private vehicle        Equipment Recommendations Other (comment) (TBD - limited assessment)  Recommendations for Other Services  Rehab consult    Functional Status Assessment Patient has had a recent decline in their functional status and demonstrates the ability to make  significant improvements in function in a reasonable and predictable amount of time.     Precautions / Restrictions Precautions Precautions: Fall Recall of Precautions/Restrictions: Impaired Restrictions Weight Bearing Restrictions Per Provider Order: No      Mobility  Bed Mobility Overal bed mobility: Needs Assistance             General bed mobility comments: Due to lethargy and reported vagal episodes, opted to use egress mode on bed. Patient tolerated well, able to progress with unsupported sitting with CGA. Uses rails to pull himself forward and sit upright. Tolerated majority of visit in this position.    Transfers Overall transfer level: Needs assistance Equipment used: Rolling walker (2 wheels) Transfers: Sit to/from Stand Sit to Stand: Mod assist, +2 physical assistance, +2 safety/equipment, From elevated surface           General transfer comment: Performed sit to stand successfully 1 of 2 attempts today with mod assist +2 for boost and balance. Demonstrates posterior instability without buckling. Performed from seated position in egress mode. Cues for technique and upright posture, a little improvement when using multimodal cues to shift weight forward.    Ambulation/Gait                  Stairs            Wheelchair Mobility     Tilt Bed    Modified Rankin (Stroke Patients Only)       Balance Overall balance assessment: Needs assistance Sitting-balance support: No upper extremity supported, Feet supported Sitting balance-Leahy Scale: Fair Sitting balance - Comments: close CGA due to lethargy.   Standing balance support: Bilateral upper extremity supported Standing balance-Leahy Scale: Poor  Pertinent Vitals/Pain Pain Assessment Pain Assessment: PAINAD Breathing: normal Negative Vocalization: none Facial Expression: smiling or inexpressive Body Language: relaxed Consolability: no need  to console PAINAD Score: 0 Pain Intervention(s): Monitored during session    Home Living Family/patient expects to be discharged to:: Unsure Living Arrangements: Spouse/significant other                 Additional Comments: Poor level of arousal, very limited history. Reports from nursing staff that he has a wife who is an Charity fundraiser for [employer]. 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 ADLs Comments: unknown at this time.     Extremity/Trunk Assessment   Upper Extremity Assessment Upper Extremity Assessment: Defer to OT evaluation    Lower Extremity Assessment Lower Extremity Assessment: Generalized weakness;Difficult to assess due to impaired cognition       Communication   Communication Communication: Impaired Factors Affecting Communication: Difficulty expressing self    Cognition Arousal: Lethargic Behavior During Therapy: Flat affect   PT - Cognitive impairments: No family/caregiver present to determine baseline, Attention, Initiation, Sequencing, Problem solving, Difficult to assess Difficult to assess due to: Level of arousal                     PT - Cognition Comments: Limited by level of arousal but was able to state he is at [place] Following commands: Impaired Following commands impaired: Follows one step commands with increased time, Follows one step commands inconsistently     Cueing Cueing Techniques: Verbal cues, Gestural cues, Tactile cues, Visual cues     General Comments General comments (skin integrity, edema, etc.): Pre-activity, reclined in bed BP 85/63 MAP 70. During activity: Avg around MAP 65 sitting upright without dizziness. Standing MAP dropped to 60 (Pt reported weakness after standing about 40 seconds.)    Exercises General Exercises - Lower  Extremity Ankle Circles/Pumps: AAROM, Both, 20 reps, Seated Quad Sets: Strengthening, Both, 5 reps, Supine Short Arc Quad: Strengthening, AAROM, Both, 10 reps, Seated   Assessment/Plan    PT Assessment Patient needs continued PT services  PT Problem List Decreased strength;Decreased activity tolerance;Decreased balance;Decreased mobility;Decreased coordination;Decreased cognition;Decreased knowledge of use of DME;Decreased safety awareness;Decreased knowledge of precautions;Cardiopulmonary status limiting activity;Obesity       PT Treatment Interventions DME instruction;Gait training;Functional mobility training;Therapeutic activities;Therapeutic exercise;Balance training;Neuromuscular re-education;Patient/family education;Cognitive remediation    PT Goals (Current goals can be found in the Care Plan section)  Acute Rehab PT Goals Patient Stated Goal: none stated PT Goal Formulation: Patient unable to participate in goal setting Time For Goal Achievement: 01/05/24 Potential to Achieve Goals: Fair    Frequency Min 2X/week     Co-evaluation               AM-PAC PT 6 Clicks Mobility  Outcome Measure Help needed turning from your back to your side while in a flat bed without using bedrails?: A Little Help needed moving from lying on your back to sitting on the side of a flat bed without using bedrails?: A Lot Help needed moving to and from a bed to a chair (including a wheelchair)?: A Lot Help needed standing up from a chair using your arms (e.g., wheelchair or bedside chair)?: A Lot Help needed to walk in hospital room?: Total  Help needed climbing 3-5 steps with a railing? : Total 6 Click Score: 11    End of Session   Activity Tolerance: Patient limited by lethargy;Patient limited by fatigue Patient left: in bed;with call bell/phone within reach;with nursing/sitter in room Nurse Communication: Mobility status;Need for lift equipment PT Visit Diagnosis: Unsteadiness on  feet (R26.81);Other abnormalities of gait and mobility (R26.89);Muscle weakness (generalized) (M62.81);Difficulty in walking, not elsewhere classified (R26.2);Other symptoms and signs involving the nervous system (R29.898)    Time: 8867-8842 PT Time Calculation (min) (ACUTE ONLY): 25 min   Charges:   PT Evaluation $PT Eval High Complexity: 1 High PT Treatments $Therapeutic Activity: 8-22 mins PT General Charges $$ ACUTE PT VISIT: 1 Visit         Leontine Roads, PT, DPT [facility]  Rehabilitation Services Physical Therapist Office: [phone] Website: [email or web address]   Leontine GORMAN Roads 12/22/2023, 2:04 PM

## 2023-12-22 NOTE — Progress Notes (Signed)
 PHARMACY - ANTICOAGULATION CONSULT NOTE  Pharmacy Consult for heparin  Indication: atrial fibrillation  Allergies  Allergen Reactions   Bee Venom Anaphylaxis    Patient Measurements: Height: 6' 5 (195.6 cm) Weight: (!) 147.1 kg (324 lb 4.8 oz) IBW/kg (Calculated) : 89.1 HEPARIN  DW (KG): 119  Vital Signs: Temp: 97 F (36.1 C) (10/14 0400) Temp Source: Axillary (10/14 0400) Pulse Rate: 100 (10/14 0700)  Labs: Recent Labs    12/20/23 0514 12/20/23 1415 12/21/23 0454 12/21/23 0810 12/21/23 1138 12/21/23 1259 12/21/23 1542 12/22/23 0432  HGB 15.9  --  15.0  --  17.7*  --   --  16.8  HCT 44.8  --  42.4  --  52.0  --   --  47.1  PLT 179  --  143*  --   --   --   --  140*  HEPARINUNFRC 0.45  --  0.52  --   --   --   --  0.40  CREATININE 3.40*   < > 2.95*   < >  --  2.90* 2.49* 1.93*   < > = values in this interval not displayed.    Estimated Creatinine Clearance: 65.5 mL/min (A) (by C-G formula based on SCr of 1.93 mg/dL (H)).   Medications:   Infusions:   sodium chloride  20 mL/hr at 12/22/23 0700   amiodarone  30 mg/hr (12/22/23 0700)   DOBUTamine 5 mcg/kg/min (12/22/23 0700)   furosemide  (LASIX ) 200 mg in dextrose  5 % 100 mL (2 mg/mL) infusion 40 mg/hr (12/22/23 0700)   heparin  1,450 Units/hr (12/22/23 0700)   norepinephrine  (LEVOPHED ) Adult infusion 26 mcg/min (12/22/23 0700)   prismasol BGK 4/2.5 400 mL/hr at 12/22/23 0158   prismasol BGK 4/2.5 400 mL/hr at 12/22/23 0159   prismasol BGK 4/2.5 1,500 mL/hr at 12/22/23 9378   sodium chloride  3% (hypertonic) Stopped (12/22/23 0117)   vasopressin 0.04 Units/min (12/22/23 0700)    Assessment: 58 YOM admitted with atrial fibrillation with rapid ventricular response. Patient underwent DCCV on 12/10/23 prior to this admission. Patient was shocked on 12/12/23 PM, now in NSR. Patient on Eliquis  prior to hospitalization, last dose on 12/11/23. Plan to start heparin  infusion prior to possible procedures. Pharmacy consulted for  heparin  dosing.   Heparin  level is therapeutic at 0.4, on heparin  1450 units/hr. No issues with infusion running or signs of bleeding per RN. CBC stable (Hgb 16.8, PLT 140).   Goal of Therapy:  Heparin  level 0.3-0.7 units/ml Monitor platelets by anticoagulation protocol: Yes   Plan:  Continue heparin  infusion at 1450 units/hr Monitor heparin  level, CBC, and s/sx of bleeding daily  Thank you for allowing pharmacy to participate in this patient's care,  Suzen Sour, PharmD, BCCCP Clinical Pharmacist  Phone: [phone] 12/22/2023 7:29 AM  Please check AMION for all [facility] Pharmacy phone numbers After 10:00 PM, call Main Pharmacy [phone]

## 2023-12-22 NOTE — Progress Notes (Signed)
 EP Chart Review:    Patient Profile:  57 y.o. male with PMH of HFrEF, PAF, OSA, VT, CKD Stage IIIa admitted with SOB, weight gain. Found to have decompensated HF / cardiogenic shock, AFwRVR / AFL s/p DCCV 12/10/23 (200jx1 > ST).  Pt had VT with DCCV on 10/4 (WCT 230bpm, 200j shock > ST), 10/6.  Required lidocaine  infusion > transitioned to mexiletine. Pre-existing concerns for possible cardiac sarcoidosis.  He was started on sarcoid protocol with steroids & methotrexate.  No further VT after steroids initiated.    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 Reviewed with AHF NP.  Call sooner if new needs arise.    Daphne Barrack, NP-C, AGACNP-BC [facility] - Electrophysiology  12/22/2023, 8:42 AM

## 2023-12-22 NOTE — Progress Notes (Signed)
   12/22/23 2108  BiPAP/CPAP/SIPAP  Reason BIPAP/CPAP not in use Non-compliant   Pt not wearing CPAP tonight. Last night, he only wore for a short time. RN stated patient is not sleeping. RN will contact RT if patient needs.

## 2023-12-22 NOTE — Progress Notes (Addendum)
 Advanced Heart Failure Rounding Note  Cardiologist: Wilbert Bihari, MD  Chief Complaint: A/C HF Subjective:   10/6 Started on Milirnone 0.125 for low output.  Developed VTwith shock x1. Sleeping at the time but described some chest burning. Lidocaine  added. K supplemented.  10/7 Cath - coronaries ok. Elevated filling pressures, CI 1.7. Milrinone  increased to 0.25 mcg. Started on pulse steroids for possible sarcoid  Milrinone  switched to DBA yesterday. Now on DBA @5 , NE at 24 and vaso at 0.04.   Coox 65%. CalcQx Fick CO/CI 5.5/1.9  On lasix  gtt at 40 and s/p 3% hypertonic saline + tolvaptan yesterday. Weight down 5 lbs. -3.5L UOP, -4.4L removed on CRRT. Net (-) 3.5. CVP 14/15  Still tired today but feels better. States his breathing feels less labored today too. Denies CP.   Objective:   Weight Range: (!) 147.1 kg Body mass index is 38.46 kg/m.   Vital Signs:   Temp:  [96.7 F (35.9 C)-97 F (36.1 C)] 97 F (36.1 C) (10/14 0400) Pulse Rate:  [63-219] 100 (10/14 0700) Resp:  [15-32] 27 (10/14 0700) SpO2:  [77 %-99 %] 93 % (10/14 0700) Arterial Line BP: (76-304)/(23-93) 89/63 (10/14 0700) Weight:  [147.1 kg] 147.1 kg (10/14 0500) Last BM Date : 12/19/23  Weight change: Filed Weights   12/20/23 0500 12/21/23 0602 12/22/23 0500  Weight: (!) 144.1 kg (!) 149.3 kg (!) 147.1 kg    Intake/Output:   Intake/Output Summary (Last 24 hours) at 12/22/2023 0712 Last data filed at 12/22/2023 0700 Gross per 24 hour  Intake 4446.29 ml  Output 8032.6 ml  Net -3586.31 ml     CVP 14/15 Physical Exam  General:  chronically ill / fatigued appearing.  No respiratory difficulty Neck: JVD ~14 cm.  Cor: Regular rate & rhythm. No murmurs. Lungs: clear, diminished bases Extremities: trace BLE edema. + UNNA boots Neuro: alert & oriented x 3. Affect drowsy.   Telemetry   ST low 100s, 2-4 PVCs/hr. Short NSVT run (Personally reviewed)    Labs    CBC Recent Labs    12/21/23 0454  12/21/23 1138 12/22/23 0432  WBC 15.2*  --  14.9*  HGB 15.0 17.7* 16.8  HCT 42.4 52.0 47.1  MCV 82.5  --  81.3  PLT 143*  --  140*   Basic Metabolic Panel Recent Labs    89/86/74 0454 12/21/23 0810 12/21/23 1542 12/21/23 2308 12/22/23 0432  NA 120*   < > 122* 126* 129*  K 3.8   < > 3.8  --  3.8  CL 88*   < > 95*  --  99  CO2 17*   < > 16*  --  18*  GLUCOSE 254*   < > 188*  --  129*  BUN 78*   < > 69*  --  50*  CREATININE 2.95*   < > 2.49*  --  1.93*  CALCIUM  7.7*   < > 7.7*  --  8.1*  MG 2.6*  --   --   --  2.7*  PHOS  --   --  5.5*  --  3.8   < > = values in this interval not displayed.   Liver Function Tests Recent Labs    12/19/23 1447 12/21/23 0810 12/21/23 1542 12/22/23 0432  AST 34 38  --   --   ALT 52* 55*  --   --   ALKPHOS 56 64  --   --   BILITOT 1.2 2.2*  --   --  PROT 5.6* 5.7*  --   --   ALBUMIN  3.0* 3.1* 3.0* 3.0*    Medications:     Scheduled Medications:  (feeding supplement) PROSource Plus  30 mL Oral BID WC   calcium  citrate  200 mg of elemental calcium  Oral Daily   Chlorhexidine  Gluconate Cloth  6 each Topical Daily   cholecalciferol  1,000 Units Oral Daily   dapsone  100 mg Oral Daily   DULoxetine   20 mg Oral Daily   feeding supplement  237 mL Oral TID BM   insulin  aspart  0-20 Units Subcutaneous TID WC   insulin  aspart  0-5 Units Subcutaneous QHS   insulin  aspart  4 Units Subcutaneous TID WC   insulin  glargine  25 Units Subcutaneous Daily   methotrexate  5 mg Oral Weekly   mexiletine  150 mg Oral Q8H   multivitamin  1 tablet Oral QHS   pantoprazole  40 mg Oral Daily   polyethylene glycol  17 g Oral BID   predniSONE   30 mg Oral Q breakfast   senna  2 tablet Oral Daily   sodium chloride  flush  10-40 mL Intracatheter Q12H   sodium chloride  flush  10-40 mL Intracatheter Q12H   sodium chloride  flush  3 mL Intravenous Q12H   tamsulosin   0.4 mg Oral QPC supper    Infusions:  sodium chloride  20 mL/hr at 12/22/23 0700   amiodarone   30 mg/hr (12/22/23 0700)   DOBUTamine 5 mcg/kg/min (12/22/23 0700)   furosemide  (LASIX ) 200 mg in dextrose  5 % 100 mL (2 mg/mL) infusion 40 mg/hr (12/22/23 0700)   heparin  1,450 Units/hr (12/22/23 0700)   norepinephrine  (LEVOPHED ) Adult infusion 26 mcg/min (12/22/23 0700)   prismasol BGK 4/2.5 400 mL/hr at 12/22/23 0158   prismasol BGK 4/2.5 400 mL/hr at 12/22/23 0159   prismasol BGK 4/2.5 1,500 mL/hr at 12/22/23 9378   sodium chloride  3% (hypertonic) Stopped (12/22/23 0117)   vasopressin 0.04 Units/min (12/22/23 0700)    PRN Medications: acetaminophen  **OR** acetaminophen , heparin , heparin , mouth rinse, oxyCODONE , prochlorperazine, sodium chloride  flush, sodium chloride  flush Patient Profile  58 year old with a history of HFrEF, PAF, OSA, VT, CKD Stage IIIa.   HF following for cardiogenic shock.  Assessment/Plan   A/C HFrEF, Cardiogenic Shock  - Normal coronary arteriography 12/2023 - Cardiac PET 2023- no evidence for cardiac sarcoid or inflammatory. LVEF 38%. - cMRI 2/25 EF 28 % RV 33% LGE pattern concerning for sarcoid.  Has hilar adenopathy on lung imaging.  - Echo 12/12/23 EF < 15 severe RV HK - Coox 65%. CalcQx Fick CO/CI 5.5/1.9 - Appears to be doing better from a VT standpoint. Now on DBA @5 , consider increasing to 7.5.  - Remains on NE as well as vasopressin for distributive shock - WBC elevated, but in the setting of steroids - Continue arterial line for BP monitoring - Continue IV lasix  gtt at 40mg /hour - GDMT limited by hypotension.  - Hold spiro and digoxin  with elevated SCr.    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 - underwent DC-CV on 12/10/23 - Maintaining SR. Continue IV amio. Of note he has previously had difficulty taking amio due to nausea.  - Continue heparin   - EP following. Potential AFL ablation (versus AVN/pacer) + ICD this admit. He is not at all optimized for this procedure   VT -S/P DC-CV 10/4  -S/P DC- CV 10/6  - Continue amio drip  - Lidocaine  gtt  stopped 10/9. Now on mexiletine.  - EP following.  -  Has been electrically quiet since start of steroids  Possible Sarcoid -Consulted EP. -Now on Sarcoid Protocol. Giving pulse steroids x 3 days and on immunosuppression with methotrexate and prednisone  - No further VT - At this point will hold off on vaccines.   AKI on CKD Stage IIIa  - Creatinine baseline ~ 1.6-1.8 - Creatinine slowly downtrending, UOP finally picking up.  - Now followed by nephrology, CRRT started 10/13 - Avoid hypotension - Continue high dose diuretics - Low urine sodium, bland urine - Continue foley - Unna boots  OSA  -Continue CPAP  Hyponatremia  - Na 129 - S/p 3% Q8 + Tolvaptan 10/13 - Follow  CRITICAL CARE Performed by: Beckey LITTIE Coe   Total critical care time: 16 minutes  Critical care time was exclusive of separately billable procedures and treating other patients.  Critical care was necessary to treat or prevent imminent or life-threatening deterioration.  Critical care was time spent personally by me on the following activities: development of treatment plan with patient and/or surrogate as well as nursing, discussions with consultants, evaluation of patient's response to treatment, examination of patient, obtaining history from patient or surrogate, ordering and performing treatments and interventions, ordering and review of laboratory studies, ordering and review of radiographic studies, pulse oximetry and re-evaluation of patient's condition.  Length of Stay: 10  Beckey LITTIE Coe, NP  12/22/2023, 7:12 AM  Advanced Heart Failure Team Pager [phone] (M-F; 7a - 5p)  Please contact [facility] for night-coverage after hours (5p -7a ) and weekends on [email or web address]  Patient seen with NP, I formulated the plan and agree with the above note.   CVVH started yesterday with marked volume overload, hyponatremia, and inadequate response to diuretics.   He is on dobutamine 5, NE 26, and vasopressin 0.04  this morning with stable MAP. Co-ox 65%.    He is in NSR with occasional PVCs on amiodarone  30 mg/hr.    He is getting CVVH pulling net negative 100 cc/hr with Lasix  gtt running 40 mg/hr.  Still making good urine, UOP 3540 yesterday.  Weight down 5 lbs. CVP remains 20.   General: NAD, lethargic Neck: JVP 16+, no thyromegaly or thyroid  nodule.  Lungs: Clear to auscultation bilaterally with normal respiratory effort. CV: Nondisplaced PMI.  Heart regular S1/S2, no S3/S4, no murmur.  1+ edema to knees.   Abdomen: Soft, nontender, no hepatosplenomegaly, no distention.  Skin: Intact without lesions or rashes.  Neurologic: Lethargic but responds appropriately to questions.  Extremities: No clubbing or cyanosis.  HEENT: Normal.   Multiple episodes of VT initially, now on amiodarone  gtt 30 mg/hr + mexiletine.  Improved since started on steroids. Only few short NSVT runs overnight and occasional PVCs.    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  Cardiac PET in 6/23 did not show active inflammation.  He is now being treated empirically with prednisone  30 mg daily + methotrexate 5 mg weekly, ventricular rhythm improved.    Cardiogenic shock with AKI.  Co-ox 65% this morning on dobutamine 5, NE 26, vasopressin 0.04.  CVVH started yesterday with intractable volume overload, AKI, and intractable hyponatremia.  Weight down 5 lbs.  He remains volume overloaded with CVP 20 on my read.  He is still making reasonable UOP on Lasix  gtt 40 mg/hr and CVVH now pulling 100 cc/hr net negative UF.  - Would continue CVVH pulling UF net negative 100 cc/hr.  Will  not push harder at this time given good UOP and would like to preserve renal function as much as we can.  Avoid hypotension.   - Continue Lasix  40 mg/hr for now as long as ok with nephrology.     Ultimately, I think he is going to need  to be considered for heart transplantation but need to stabilize him first.   CRITICAL CARE Performed by: Ezra Shuck  Total critical care time: 40 minutes  Critical care time was exclusive of separately billable procedures and treating other patients.  Critical care was necessary to treat or prevent imminent or life-threatening deterioration.  Critical care was time spent personally by me on the following activities: development of treatment plan with patient and/or surrogate as well as nursing, discussions with consultants, evaluation of patient's response to treatment, examination of patient, obtaining history from patient or surrogate, ordering and performing treatments and interventions, ordering and review of laboratory studies, ordering and review of radiographic studies, pulse oximetry and re-evaluation of patient's condition.  Ezra Shuck 12/22/2023 8:44 AM

## 2023-12-22 NOTE — Progress Notes (Signed)
 Nutrition Follow-up  DOCUMENTATION CODES:   Non-severe (moderate) malnutrition in context of chronic illness  INTERVENTION:   Discussed nutrition poc with Dr. Rolan and plan for Cortrak tomorrow  Downgrade diet to Dysphagia 3 (easy to chew). Recommend holding meal tray if pt not alert enough for safe po Continue Ensure Plus High Protein po TID, each supplement provides 350 kcal and 20 grams of protein.  D/C ProSource modular by mouth  Tube Feeding via Cortrak:  Vital 1.5 at at 60 ml/hr with Pro-Source TF20 60 mL TID Begin TF at rate of 20 ml/hr, titrate by 10 mL q 8 hours until goal rate of 60 ml/hr TF goal regimen provides 2400 kcals, 157 g of protein and 1094 mL of free water  Recommend considering increasing bowel regimen if no BM over the next 24 hours  Renal MVI daily  NUTRITION DIAGNOSIS:   Moderate Malnutrition related to chronic illness as evidenced by moderate fat depletion, mild muscle depletion, moderate muscle depletion.   GOAL:   Patient will meet greater than or equal to 90% of their needs   MONITOR:   PO intake, Supplement acceptance, Labs, Weight trends, I & O's  REASON FOR ASSESSMENT:   Consult Assessment of nutrition requirement/status  ASSESSMENT:   58 yo male admitted with decompensated HF, A.fib with RVR and a flutter requiring multiple DCCV, +concern for cardiac sarcoidosis. Pt with +Cardiogenic shock, worsening AKI  requiring CRRT. PMH includes HTN, NICM, CHF, OSA, PAF  10/04 Admitted, Echo EF <15%, DC-CV 10/05 Started in milrinone  for low output; VT with shock x 1 10/07 Started on steroids for possible sarcoidosis 10/10 Levophed  initiated 10/13 CRRT initiated, RD initial assessment  Remains on CRRT Levophed  up to 24 Vasopressin at 0.04 Dobutamine at 5  Pt remains lethargic, arousable but falls back a sleep quickly, speech is garbled.   Recorded po intake 10% at breakfast with 0% of lunch and dinner. Pt has taken meds with applesauce  but nothing else this AM  Need to closely monitor bowel function. Yesterday pt reported to RN that last BM was 10/11 but last documented BM was 10/06.   UOP 3540 mL in 24 hours, 475 mL thus far today. Noted 4.5 L volume removed via CRRT  Weight down to 147 kg from 149 kg yesterday. Lowest wt this admission 129.4 kg, dry weight unknown  Labs: Sodium 129, up from 120 yesterday Potassium 3.8 (wd) Phosphorus 3.8 (wdl) Magnesium 2.7 (H) CBGs 118-186  Meds: SS novolog  Lasix  gtt Novolog  TID with meals Lantus  Rena-Vite Methotrexate Prednisone  Senna  NUTRITION - FOCUSED PHYSICAL EXAM:  Flowsheet Row Most Recent Value  Orbital Region Moderate depletion  Upper Arm Region Unable to assess  Thoracic and Lumbar Region Unable to assess  [edema]  Buccal Region Moderate depletion  Temple Region Moderate depletion  Clavicle Bone Region Mild depletion  Clavicle and Acromion Bone Region Mild depletion  Scapular Bone Region Mild depletion  Dorsal Hand Unable to assess  Patellar Region Mild depletion  Anterior Thigh Region Mild depletion  Posterior Calf Region Unable to assess  Edema (RD Assessment) Moderate  Hair Reviewed  Eyes Unable to assess  Mouth Unable to assess  Skin Reviewed  Nails Reviewed    Diet Order:   Diet Order             DIET DYS 3 Room service appropriate? Yes; Fluid consistency: Thin; Fluid restriction: 1800 mL Fluid  Diet effective now  EDUCATION NEEDS:   Not appropriate for education at this time  Skin:  Skin Assessment: Reviewed RN Assessment  Last BM:  10/6 documented, 10/11 per pt report  Height:   Ht Readings from Last 1 Encounters:  12/12/23 6' 5 (1.956 m)    Weight:   Wt Readings from Last 1 Encounters:  12/22/23 (!) 147.1 kg    BMI:  Body mass index is 38.46 kg/m.  Estimated Nutritional Needs:   Kcal:  2300-2500 kcals  Protein:  140-170 g  Fluid:  1.8 L   Betsey Finger MS, RDN, LDN, CNSC Registered  Dietitian 3 Clinical Nutrition RD Inpatient Contact Info in Amion

## 2023-12-23 ENCOUNTER — Inpatient Hospital Stay (HOSPITAL_COMMUNITY)

## 2023-12-23 DIAGNOSIS — I5023 Acute on chronic systolic (congestive) heart failure: Secondary | ICD-10-CM | POA: Diagnosis not present

## 2023-12-23 LAB — HEPATIC FUNCTION PANEL
ALT: 55 U/L — ABNORMAL HIGH (ref 0–44)
AST: 47 U/L — ABNORMAL HIGH (ref 15–41)
Albumin: 3.1 g/dL — ABNORMAL LOW (ref 3.5–5.0)
Alkaline Phosphatase: 62 U/L (ref 38–126)
Bilirubin, Direct: 1.8 mg/dL — ABNORMAL HIGH (ref 0.0–0.2)
Indirect Bilirubin: 3.1 mg/dL — ABNORMAL HIGH (ref 0.3–0.9)
Total Bilirubin: 4.9 mg/dL — ABNORMAL HIGH (ref 0.0–1.2)
Total Protein: 5.7 g/dL — ABNORMAL LOW (ref 6.5–8.1)

## 2023-12-23 LAB — RENAL FUNCTION PANEL
Albumin: 2.9 g/dL — ABNORMAL LOW (ref 3.5–5.0)
Albumin: 2.9 g/dL — ABNORMAL LOW (ref 3.5–5.0)
Anion gap: 12 (ref 5–15)
Anion gap: 13 (ref 5–15)
BUN: 36 mg/dL — ABNORMAL HIGH (ref 6–20)
BUN: 36 mg/dL — ABNORMAL HIGH (ref 6–20)
CO2: 18 mmol/L — ABNORMAL LOW (ref 22–32)
CO2: 17 mmol/L — ABNORMAL LOW (ref 22–32)
Calcium: 8.1 mg/dL — ABNORMAL LOW (ref 8.9–10.3)
Calcium: 8 mg/dL — ABNORMAL LOW (ref 8.9–10.3)
Chloride: 99 mmol/L (ref 98–111)
Chloride: 99 mmol/L (ref 98–111)
Creatinine, Ser: 1.77 mg/dL — ABNORMAL HIGH (ref 0.61–1.24)
Creatinine, Ser: 1.92 mg/dL — ABNORMAL HIGH (ref 0.61–1.24)
GFR, Estimated: 44 mL/min — ABNORMAL LOW
GFR, Estimated: 40 mL/min — ABNORMAL LOW (ref >60)
Glucose, Bld: 106 mg/dL — ABNORMAL HIGH (ref 70–99)
Glucose, Bld: 204 mg/dL — ABNORMAL HIGH (ref 70–99)
Phosphorus: 2.6 mg/dL (ref 2.5–4.6)
Phosphorus: 3.2 mg/dL (ref 2.5–4.6)
Potassium: 4.5 mmol/L (ref 3.5–5.1)
Potassium: 4.6 mmol/L (ref 3.5–5.1)
Sodium: 129 mmol/L — ABNORMAL LOW (ref 135–145)
Sodium: 129 mmol/L — ABNORMAL LOW (ref 135–145)

## 2023-12-23 LAB — COOXEMETRY PANEL
Carboxyhemoglobin: 1.3 % (ref 0.5–1.5)
Carboxyhemoglobin: 1.6 % — ABNORMAL HIGH (ref 0.5–1.5)
Carboxyhemoglobin: 1.6 % — ABNORMAL HIGH (ref 0.5–1.5)
Methemoglobin: 0.8 % (ref 0.0–1.5)
Methemoglobin: <0.7 % (ref 0.0–1.5)
Methemoglobin: <0.7 % (ref 0.0–1.5)
O2 Saturation: 54 %
O2 Saturation: 56.4 %
O2 Saturation: 58.3 %
Total hemoglobin: 15 g/dL (ref 12.0–16.0)
Total hemoglobin: 16.4 g/dL — ABNORMAL HIGH (ref 12.0–16.0)
Total hemoglobin: 16.8 g/dL — ABNORMAL HIGH (ref 12.0–16.0)

## 2023-12-23 LAB — CBC
HCT: 47.6 % (ref 39.0–52.0)
Hemoglobin: 16.6 g/dL (ref 13.0–17.0)
MCH: 28.8 pg (ref 26.0–34.0)
MCHC: 34.9 g/dL (ref 30.0–36.0)
MCV: 82.5 fL (ref 80.0–100.0)
Platelets: 119 K/uL — ABNORMAL LOW (ref 150–400)
RBC: 5.77 MIL/uL (ref 4.22–5.81)
RDW: 15.3 % (ref 11.5–15.5)
WBC: 18.4 K/uL — ABNORMAL HIGH (ref 4.0–10.5)
nRBC: 2 % — ABNORMAL HIGH (ref 0.0–0.2)

## 2023-12-23 LAB — GLUCOSE, CAPILLARY
Glucose-Capillary: 107 mg/dL — ABNORMAL HIGH (ref 70–99)
Glucose-Capillary: 109 mg/dL — ABNORMAL HIGH (ref 70–99)
Glucose-Capillary: 175 mg/dL — ABNORMAL HIGH (ref 70–99)
Glucose-Capillary: 195 mg/dL — ABNORMAL HIGH (ref 70–99)
Glucose-Capillary: 233 mg/dL — ABNORMAL HIGH (ref 70–99)
Glucose-Capillary: 56 mg/dL — ABNORMAL LOW (ref 70–99)
Glucose-Capillary: 76 mg/dL (ref 70–99)
Glucose-Capillary: 89 mg/dL (ref 70–99)
Glucose-Capillary: 99 mg/dL (ref 70–99)

## 2023-12-23 LAB — URINALYSIS, W/ REFLEX TO CULTURE (INFECTION SUSPECTED)
Bilirubin Urine: NEGATIVE
Glucose, UA: NEGATIVE mg/dL
Ketones, ur: NEGATIVE mg/dL
Leukocytes,Ua: NEGATIVE
Nitrite: NEGATIVE
Protein, ur: 30 mg/dL — AB
Specific Gravity, Urine: 1.014 (ref 1.005–1.030)
pH: 5 (ref 5.0–8.0)

## 2023-12-23 LAB — MAGNESIUM: Magnesium: 2.5 mg/dL — ABNORMAL HIGH (ref 1.7–2.4)

## 2023-12-23 LAB — POCT I-STAT 7, (LYTES, BLD GAS, ICA,H+H)
Acid-base deficit: 2 mmol/L (ref 0.0–2.0)
Bicarbonate: 18.9 mmol/L — ABNORMAL LOW (ref 20.0–28.0)
Calcium, Ion: 1.08 mmol/L — ABNORMAL LOW (ref 1.15–1.40)
HCT: 50 % (ref 39.0–52.0)
Hemoglobin: 17 g/dL (ref 13.0–17.0)
O2 Saturation: 98 %
Patient temperature: 98.4
Potassium: 4.1 mmol/L (ref 3.5–5.1)
Sodium: 132 mmol/L — ABNORMAL LOW (ref 135–145)
TCO2: 20 mmol/L — ABNORMAL LOW (ref 22–32)
pCO2 arterial: 25 mmHg — ABNORMAL LOW (ref 32–48)
pH, Arterial: 7.486 — ABNORMAL HIGH (ref 7.35–7.45)
pO2, Arterial: 95 mmHg (ref 83–108)

## 2023-12-23 LAB — HEPARIN LEVEL (UNFRACTIONATED): Heparin Unfractionated: 0.41 [IU]/mL (ref 0.30–0.70)

## 2023-12-23 LAB — CULTURE, BLOOD (ROUTINE X 2)
Culture: NO GROWTH
Culture: NO GROWTH

## 2023-12-23 LAB — CG4 I-STAT (LACTIC ACID)
Lactic Acid, Venous: 1.9 mmol/L (ref 0.5–1.9)
Lactic Acid, Venous: 2.1 mmol/L (ref 0.5–1.9)
Lactic Acid, Venous: 1.8 mmol/L (ref 0.5–1.9)

## 2023-12-23 LAB — AMMONIA: Ammonia: 52 umol/L — ABNORMAL HIGH (ref 9–35)

## 2023-12-23 MED ORDER — PROSOURCE TF20 ENFIT COMPATIBL EN LIQD
60.0000 mL | Freq: Three times a day (TID) | ENTERAL | Status: DC
  Administered 2023-12-23 – 2023-12-25 (×7): 60 mL
  Filled 2023-12-23 (×7): qty 60

## 2023-12-23 MED ORDER — TRAZODONE HCL 50 MG PO TABS: 50.0000 mg | ORAL_TABLET | Freq: Every day | ORAL | Status: DC

## 2023-12-23 MED ORDER — INSULIN ASPART 100 UNIT/ML IJ SOLN
0.0000 [IU] | INTRAMUSCULAR | Status: DC
  Administered 2023-12-23: 2 [IU] via SUBCUTANEOUS
  Administered 2023-12-23: 3 [IU] via SUBCUTANEOUS
  Administered 2023-12-23: 2 [IU] via SUBCUTANEOUS
  Administered 2023-12-24: 3 [IU] via SUBCUTANEOUS
  Administered 2023-12-24: 2 [IU] via SUBCUTANEOUS
  Administered 2023-12-24 (×2): 3 [IU] via SUBCUTANEOUS
  Administered 2023-12-24: 2 [IU] via SUBCUTANEOUS
  Administered 2023-12-24 – 2023-12-25 (×2): 3 [IU] via SUBCUTANEOUS

## 2023-12-23 MED ORDER — VANCOMYCIN HCL 1500 MG/300ML IV SOLN
1500.0000 mg | INTRAVENOUS | Status: DC
  Administered 2023-12-24 – 2023-12-28 (×5): 1500 mg via INTRAVENOUS
  Filled 2023-12-23 (×6): qty 300

## 2023-12-23 MED ORDER — LACTULOSE 10 GM/15ML PO SOLN
20.0000 g | Freq: Three times a day (TID) | ORAL | Status: DC
Start: 2023-12-23 — End: 2023-12-24
  Administered 2023-12-23: 20 g via ORAL
  Filled 2023-12-23 (×2): qty 30

## 2023-12-23 MED ORDER — INSULIN GLARGINE 100 UNIT/ML ~~LOC~~ SOLN
20.0000 [IU] | Freq: Every day | SUBCUTANEOUS | Status: DC
  Filled 2023-12-23: qty 0.2

## 2023-12-23 MED ORDER — INSULIN GLARGINE-YFGN 100 UNIT/ML ~~LOC~~ SOLN
20.0000 [IU] | Freq: Every day | SUBCUTANEOUS | Status: DC
  Administered 2023-12-23 – 2023-12-28 (×6): 20 [IU] via SUBCUTANEOUS
  Filled 2023-12-23 (×8): qty 0.2

## 2023-12-23 MED ORDER — PIPERACILLIN-TAZOBACTAM 3.375 G IVPB 30 MIN
3.3750 g | Freq: Four times a day (QID) | INTRAVENOUS | Status: DC
  Administered 2023-12-23 – 2023-12-29 (×24): 3.375 g via INTRAVENOUS
  Filled 2023-12-23 (×24): qty 50

## 2023-12-23 MED ORDER — VANCOMYCIN HCL 2000 MG/400ML IV SOLN
2000.0000 mg | Freq: Once | INTRAVENOUS | Status: AC
  Administered 2023-12-23: 2000 mg via INTRAVENOUS
  Filled 2023-12-23: qty 400

## 2023-12-23 MED ORDER — VITAL 1.5 CAL PO LIQD
1000.0000 mL | ORAL | Status: DC
  Administered 2023-12-23 – 2023-12-25 (×3): 1000 mL

## 2023-12-23 MED ORDER — INSULIN GLARGINE 100 UNIT/ML ~~LOC~~ SOLN
15.0000 [IU] | Freq: Every day | SUBCUTANEOUS | Status: DC
  Filled 2023-12-23: qty 0.15

## 2023-12-23 NOTE — Procedures (Signed)
 Cortrak  Person Inserting Tube:  Harold Park, RD Tube Type:  Cortrak - 43 inches Tube Size:  10 Tube Location:  Left nare Secured by: Bridle Initial Placement:  Gastric Technique Used to Measure Tube Placement:  Marking at nare/corner of mouth Cortrak Secured At:  65 cm Initial Placement Verification:  Xray  Cortrak Tube Team Note:  Consult received to place a Cortrak feeding tube.   Ordered xray to confirm placement.   If the tube becomes dislodged please keep the tube and contact the Cortrak team at [email or web address] for replacement.  If after hours and replacement cannot be delayed, place a NG tube and confirm placement with an abdominal x-ray.    Josette Elihue, MS, RDN, LDN Clinical Dietitian I Please reach out via secure chat

## 2023-12-23 NOTE — Progress Notes (Signed)
 Pharmacy Antibiotic Note  Harold Park is a 58 y.o. male admitted on 12/11/2023 with sepsis.  Pharmacy has been consulted for vancomycin dosing.  WBC increased to 18, LA 1.9, afebrile on CRRT. Still requiring high doses of pressors. Remains on steroids/methotrexate/dapsone for sarcoid treatment.   Plan: Order vancomycin 2g IV once then 1500 mg IV every 24 hours Order zosyn 3.375g IV every 6 hours  Monitor CRRT plans, cx results, clinical pic, and for vanc levels as needed  Height: 6' 5 (195.6 cm) Weight: (!) 146 kg (321 lb 14 oz) IBW/kg (Calculated) : 89.1  Temp (24hrs), Avg:97.4 F (36.3 C), Min:96.9 F (36.1 C), Max:98.5 F (36.9 C)  Recent Labs  Lab 12/17/23 1717 12/18/23 0506 12/18/23 0751 12/18/23 1427 12/19/23 0500 12/19/23 1405 12/20/23 0514 12/20/23 1415 12/21/23 0454 12/21/23 0810 12/21/23 1259 12/21/23 1542 12/22/23 0432 12/22/23 1613 12/23/23 0411 12/23/23 0814  WBC  --    < >  --   --  16.2*  --  18.4*  --  15.2*  --   --   --  14.9*  --  18.4*  --   CREATININE  --    < >  --    < > 3.74*   < > 3.40*   < > 2.95*   < > 2.90* 2.49* 1.93* 1.86* 1.77*  --   LATICACIDVEN 2.7*  --  1.8  --   --   --   --   --   --   --   --   --   --   --   --  1.9   < > = values in this interval not displayed.    Estimated Creatinine Clearance: 71.1 mL/min (A) (by C-G formula based on SCr of 1.77 mg/dL (H)).    Allergies  Allergen Reactions   Bee Venom Anaphylaxis    Antimicrobials this admission: Vancomycin 10/15 >>  Zosyn 10/15 >>   Dose adjustments this admission: N/A  Microbiology results: 10/15 BCx: sent 10/10 Bcx: ngtd  10/4 MRSA PCR: neg 10/3 COVID/Flu/RSV PCR: neg  Thank you for allowing pharmacy to participate in this patient's care,  Suzen Sour, PharmD, BCCCP Clinical Pharmacist  Phone: [phone] 12/23/2023 8:58 AM  Please check AMION for all [facility] phone numbers After 10:00 PM, call Main Pharmacy [phone]

## 2023-12-23 NOTE — Procedures (Signed)
 Cortrak  Person Inserting Tube:  Harold Park, RD Tube Type:  Cortrak - 43 inches Tube Size:  10 Tube Location:  Left nare Secured by: Bridle Initial Placement:  Gastric Technique Used to Measure Tube Placement:  Marking at nare/corner of mouth Cortrak Secured At:  73 cm Initial Placement Verification:  Xray  Cortrak Tube Team Note:  Consult received to place a Cortrak feeding tube. Returned to reposition tube, x ray required to confirm repositioned placement.   If the tube becomes dislodged please keep the tube and contact the Cortrak team at [email or web address] for replacement.  If after hours and replacement cannot be delayed, place a NG tube and confirm placement with an abdominal x-ray.    Harold Elihue, MS, RDN, LDN Clinical Dietitian I Please reach out via secure chat

## 2023-12-23 NOTE — Progress Notes (Signed)
 Brief Nutrition Support Note  Pt remains drowsy and with minimal PO intake. SLP downgraded to full liquid diet today.   Pt did have BM today x 2 after receiving lactulose.   Pt underwent Cortrak placement this AM. Will enter TF regimen as recommended in previous RD note. For last full assessment, see RD note from 10/14  Recommend the following: Tube Feeding via Cortrak:  Vital 1.5 at at 60 ml/hr with Pro-Source TF20 60 mL TID Begin TF at rate of 20 ml/hr, titrate by 10 mL q 8 hours until goal rate of 60 ml/hr TF goal regimen provides 2400 kcals, 157 g of protein and 1094 mL of free water    Vernell Lukes, RD, LDN, CNSC Registered Dietitian II Please reach out via secure chat

## 2023-12-23 NOTE — Plan of Care (Signed)
  Problem: Education: Goal: Knowledge of General Education information will improve Description: Including pain rating scale, medication(s)/side effects and non-pharmacologic comfort measures Outcome: Progressing   Problem: Health Behavior/Discharge Planning: Goal: Ability to manage health-related needs will improve Outcome: Progressing   Problem: Clinical Measurements: Goal: Ability to maintain clinical measurements within normal limits will improve Outcome: Progressing Goal: Will remain free from infection Outcome: Progressing Goal: Diagnostic test results will improve Outcome: Progressing Goal: Respiratory complications will improve Outcome: Progressing Goal: Cardiovascular complication will be avoided Outcome: Progressing   Problem: Activity: Goal: Risk for activity intolerance will decrease Outcome: Progressing   Problem: Nutrition: Goal: Adequate nutrition will be maintained Outcome: Progressing   Problem: Coping: Goal: Level of anxiety will decrease Outcome: Progressing   Problem: Elimination: Goal: Will not experience complications related to bowel motility Outcome: Progressing Goal: Will not experience complications related to urinary retention Outcome: Progressing   Problem: Pain Managment: Goal: General experience of comfort will improve and/or be controlled Outcome: Progressing   Problem: Safety: Goal: Ability to remain free from injury will improve Outcome: Progressing   Problem: Skin Integrity: Goal: Risk for impaired skin integrity will decrease Outcome: Progressing   Problem: Education: Goal: Ability to demonstrate management of disease process will improve Outcome: Progressing Goal: Ability to verbalize understanding of medication therapies will improve Outcome: Progressing   Problem: Activity: Goal: Capacity to carry out activities will improve Outcome: Progressing   Problem: Cardiac: Goal: Ability to achieve and maintain adequate  cardiopulmonary perfusion will improve Outcome: Progressing   Problem: Education: Goal: Ability to describe self-care measures that may prevent or decrease complications (Diabetes Survival Skills Education) will improve Outcome: Progressing   Problem: Coping: Goal: Ability to adjust to condition or change in health will improve Outcome: Progressing   Problem: Fluid Volume: Goal: Ability to maintain a balanced intake and output will improve Outcome: Progressing   Problem: Health Behavior/Discharge Planning: Goal: Ability to identify and utilize available resources and services will improve Outcome: Progressing Goal: Ability to manage health-related needs will improve Outcome: Progressing   Problem: Metabolic: Goal: Ability to maintain appropriate glucose levels will improve Outcome: Progressing   Problem: Nutritional: Goal: Maintenance of adequate nutrition will improve Outcome: Progressing Goal: Progress toward achieving an optimal weight will improve Outcome: Progressing   Problem: Skin Integrity: Goal: Risk for impaired skin integrity will decrease Outcome: Progressing   Problem: Tissue Perfusion: Goal: Adequacy of tissue perfusion will improve Outcome: Progressing   Problem: Education: Goal: Understanding of CV disease, CV risk reduction, and recovery process will improve Outcome: Progressing   Problem: Activity: Goal: Ability to return to baseline activity level will improve Outcome: Progressing   Problem: Cardiovascular: Goal: Ability to achieve and maintain adequate cardiovascular perfusion will improve Outcome: Progressing Goal: Vascular access site(s) Level 0-1 will be maintained Outcome: Progressing   Problem: Health Behavior/Discharge Planning: Goal: Ability to safely manage health-related needs after discharge will improve Outcome: Progressing

## 2023-12-23 NOTE — Inpatient Diabetes Management (Addendum)
 Inpatient Diabetes Program Recommendations  AACE/ADA: New Consensus Statement on Inpatient Glycemic Control (2015)  Target Ranges:  Prepandial:   less than 140 mg/dL      Peak postprandial:   less than 180 mg/dL (1-2 hours)      Critically ill patients:  140 - 180 mg/dL   Lab Results  Component Value Date   GLUCAP 99 12/23/2023   HGBA1C 5.5 12/14/2023    Latest Reference Range & Units 12/22/23 11:05 12/22/23 16:34 12/22/23 21:17 12/22/23 21:39 12/22/23 23:54 12/23/23 02:11 12/23/23 02:13 12/23/23 04:07 12/23/23 06:08  Glucose-Capillary 70 - 99 mg/dL 865 (H) Novolog  3 units 106 (H) 66 (L) 90 89 56 (L) 76 107 (H) 99  (H): Data is abnormally high (L): Data is abnormally low  Diabetes history: DM2 Outpatient Diabetes medications: Jardiance  10 mg daily, Zepbound  12.5 mg daily Current orders for Inpatient glycemic control: Lantus 15 units daily Novolog  0-20 units tid, 0-5 units hs Prednisone  30 mg daily  Inpatient Diabetes Program Recommendations:   Patient had hypoglycemia post Novolog  correction. Please consider: -Decrease Novolog  correction to 0-9 units q 4 hrs. -Increase Lantus to 20 units daily Discussed with pharmacist Suzen Sour. Noted consult for Cortrack feeding tube.  Thank you, Artia Singley E. Khaleesi Gruel, RN, MSN, CNS, CDCES  Diabetes Coordinator Inpatient Glycemic Control Team Team Pager [phone] (8am-5pm) 12/23/2023 10:34 AM

## 2023-12-23 NOTE — Progress Notes (Addendum)
 Advanced Heart Failure Rounding Note  Cardiologist: Wilbert Bihari, MD  Chief Complaint: A/C HF Subjective:   10/6 Started on Milirnone 0.125 for low output.  Developed VTwith shock x1. Sleeping at the time but described some chest burning. Lidocaine  added. K supplemented.  10/7 Cath - coronaries ok. Elevated filling pressures, CI 1.7. Milrinone  increased to 0.25 mcg. Started on pulse steroids for possible sarcoid 10/13: Milrinone  switched to DBA  Now on DBA @5 , NE at 30 and vaso at 0.04.   Coox 56%. CalcQx Fick CO/CI 4/1.42  On lasix  gtt at 40. Weight down 3 lbs. -1.3L UOP, -4.5L removed on CRRT. Net (-) . CVP 16  Very drowsy today. Able to follow commands and answer some questions. Did get Trazodone around midnight.   Objective:   Weight Range: (!) 146 kg Body mass index is 38.17 kg/m.   Vital Signs:   Temp:  [96.9 F (36.1 C)-98.5 F (36.9 C)] 98.5 F (36.9 C) (10/15 0400) Pulse Rate:  [87-110] 100 (10/15 0700) Resp:  [18-33] 26 (10/15 0700) SpO2:  [91 %-98 %] 95 % (10/15 0700) Arterial Line BP: (72-99)/(44-79) 84/62 (10/15 0700) Weight:  [146 kg] 146 kg (10/15 0500) Last BM Date : 12/19/23  Weight change: Filed Weights   12/21/23 0602 12/22/23 0500 12/23/23 0500  Weight: (!) 149.3 kg (!) 147.1 kg (!) 146 kg    Intake/Output:   Intake/Output Summary (Last 24 hours) at 12/23/2023 0710 Last data filed at 12/23/2023 0700 Gross per 24 hour  Intake 3715.37 ml  Output 4552.8 ml  Net -837.43 ml     CVP 16 Physical Exam  General:  chronically ill/ drowsy.  No respiratory difficulty Neck: JVD ~15 cm.  Cor: Regular rate & rhythm. No murmurs. Lungs: clear Extremities: trace BLE edema. + UNNA boots Neuro: alert & oriented x 3. Affect drowsy.   Telemetry   ST low 100s, 2-5 PVCs/hr. (Personally reviewed)    Labs    CBC Recent Labs    12/22/23 0432 12/23/23 0411  WBC 14.9* 18.4*  HGB 16.8 16.6  HCT 47.1 47.6  MCV 81.3 82.5  PLT 140* 119*    Basic Metabolic Panel Recent Labs    89/85/74 0432 12/22/23 1613 12/23/23 0411  NA 129* 129* 129*  K 3.8 4.1 4.5  CL 99 100 99  CO2 18* 18* 18*  GLUCOSE 129* 102* 106*  BUN 50* 43* 36*  CREATININE 1.93* 1.86* 1.77*  CALCIUM  8.1* 8.0* 8.1*  MG 2.7*  --  2.5*  PHOS 3.8 3.5 2.6   Liver Function Tests Recent Labs    12/21/23 0810 12/21/23 1542 12/22/23 1613 12/23/23 0411  AST 38  --   --   --   ALT 55*  --   --   --   ALKPHOS 64  --   --   --   BILITOT 2.2*  --   --   --   PROT 5.7*  --   --   --   ALBUMIN  3.1*   < > 3.0* 2.9*   < > = values in this interval not displayed.    Medications:     Scheduled Medications:  calcium  citrate  200 mg of elemental calcium  Oral Daily   Chlorhexidine  Gluconate Cloth  6 each Topical Daily   cholecalciferol  1,000 Units Oral Daily   dapsone  100 mg Oral Daily   DULoxetine   20 mg Oral Daily   feeding supplement  237 mL Oral TID BM  insulin  aspart  0-20 Units Subcutaneous TID WC   insulin  aspart  0-5 Units Subcutaneous QHS   insulin  aspart  4 Units Subcutaneous TID WC   insulin  glargine  25 Units Subcutaneous Daily   melatonin  3 mg Oral QHS   methotrexate  5 mg Oral Weekly   mexiletine  150 mg Oral Q8H   multivitamin  1 tablet Oral QHS   pantoprazole  40 mg Oral Daily   polyethylene glycol  17 g Oral BID   predniSONE   30 mg Oral Q breakfast   senna  2 tablet Oral Daily   sodium chloride  flush  10-40 mL Intracatheter Q12H   sodium chloride  flush  10-40 mL Intracatheter Q12H   sodium chloride  flush  3 mL Intravenous Q12H   tamsulosin   0.4 mg Oral QPC supper    Infusions:  sodium chloride  20 mL/hr at 12/23/23 0700   amiodarone  30 mg/hr (12/23/23 0700)   DOBUTamine 5 mcg/kg/min (12/23/23 0700)   furosemide  (LASIX ) 200 mg in dextrose  5 % 100 mL (2 mg/mL) infusion 40 mg/hr (12/23/23 0700)   heparin  1,450 Units/hr (12/23/23 0700)   norepinephrine  (LEVOPHED ) Adult infusion 29 mcg/min (12/23/23 0700)   prismasol BGK 4/2.5  400 mL/hr at 12/23/23 0258   prismasol BGK 4/2.5 400 mL/hr at 12/23/23 0302   prismasol BGK 4/2.5 1,500 mL/hr at 12/23/23 0656   vasopressin 0.04 Units/min (12/23/23 0700)    PRN Medications: acetaminophen  **OR** acetaminophen , heparin , heparin , mouth rinse, oxyCODONE , prochlorperazine, sodium chloride  flush, sodium chloride  flush Patient Profile  59 year old with a history of HFrEF, PAF, OSA, VT, CKD Stage IIIa.   HF following for cardiogenic shock.  Assessment/Plan   A/C HFrEF, Cardiogenic Shock  - Normal coronary arteriography 12/2023 - Cardiac PET 2023- no evidence for cardiac sarcoid or inflammatory. LVEF 38%. - cMRI 2/25 EF 28 % RV 33% LGE pattern concerning for sarcoid.  Has hilar adenopathy on lung imaging.  - Echo 12/12/23 EF < 15 severe RV HK - Coox 56%. CalcQx Fick CO/CI 4/1.42 - Appears to be doing better from a VT standpoint. Now on DBA @5 , will increase to 7.5 with worsening hemodynamics.  - Remains on NE as well as vasopressin for distributive shock - WBC elevated, but in the setting of steroids - Continue arterial line for BP monitoring - Stop IV lasix  with decreased UOP to allow for further removal from CRRT - GDMT limited by hypotension.  - Hold spiro and digoxin  with elevated SCr.    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 - underwent DC-CV on 12/10/23 - Maintaining SR. Continue IV amio. Of note he has previously had difficulty taking amio due to nausea.  - Continue heparin   - EP following. Potential AFL ablation (versus AVN/pacer) + ICD this admit. He is not at all optimized for this procedure   VT -S/P DC-CV 10/4  -S/P DC- CV 10/6  - Continue amio drip  - Lidocaine  gtt stopped 10/9. Now on mexiletine.  - EP following.  - Has been electrically quiet since start of steroids  Possible Sarcoid -Consulted EP. -Now on Sarcoid Protocol. Giving pulse steroids x 3 days and on immunosuppression with methotrexate and prednisone  - No further VT - At this point will  hold off on vaccines.   AKI on CKD Stage IIIa  - Creatinine baseline ~ 1.6-1.8 - Creatinine slowly downtrending, UOP finally picking up.  - Now followed by nephrology, CRRT started 10/13 - Avoid hypotension. Goal MAP >70.  - Stop IV  lasix  today with decreased UOP.  - Low urine sodium.  - Urine now dark.  - Continue foley - Unna boots  OSA  -Continue CPAP  Hyponatremia  - Na 129 - S/p 3% Q8 several days + Tolvaptan 10/13 - Follow  AMS - Slightly altered and pulling lines overnight.  - Check ammonia and lactic acid - Start delirium precautions - Getting cortrak today - Will order trazodone for night time  CRITICAL CARE Performed by: Beckey LITTIE Coe   Total critical care time: 15 minutes  Critical care time was exclusive of separately billable procedures and treating other patients.  Critical care was necessary to treat or prevent imminent or life-threatening deterioration.  Critical care was time spent personally by me on the following activities: development of treatment plan with patient and/or surrogate as well as nursing, discussions with consultants, evaluation of patient's response to treatment, examination of patient, obtaining history from patient or surrogate, ordering and performing treatments and interventions, ordering and review of laboratory studies, ordering and review of radiographic studies, pulse oximetry and re-evaluation of patient's condition.  Length of Stay: 11  Beckey LITTIE Coe, NP  12/23/2023, 7:10 AM  Advanced Heart Failure Team Pager [phone] (M-F; 7a - 5p)  Please contact [facility] for night-coverage after hours (5p -7a ) and weekends on [email or web address]  Patient seen with NP, I formulated the plan and agree with the above note.   Mental status has not improved with CVVH, he is lethargic but awakens and can tell me where he is.   MAP remains marginal, has limited CVVH UF.  Have been aiming for 100 cc/hr net negative.  Still making reasonable urine  with Lasix  gtt 40 mg/hr (1305 charted but more around foley). He is now on NE 30, vasopressin 0.04, and dobutamine 5 with co-ox 56%.  CVP remains 17 on my read.  Weight down 3 lbs.  Lactate 1.9.   General: NAD Neck: Thick. JVP 16 cm, no thyromegaly or thyroid  nodule.  Lungs: Clear to auscultation bilaterally with normal respiratory effort. CV: Nondisplaced PMI.  Heart regular S1/S2, no S3/S4, no murmur.  2+ edema to thighs.  Abdomen: Soft, nontender, no hepatosplenomegaly, no distention.  Skin: Intact without lesions or rashes.  Neurologic: Lethargic but awakens and can tell me where he is.  Extremities: No clubbing or cyanosis.  HEENT: Normal.   Mental status not improving as much as I had hoped with CVVH, still very lethargic. Sending NH3 and checking LFTs.    With low BP and rising WBCs (18.4 today, though on steroids), will panculture, get CXR, and given critical illness will start empirically on Zosyn and vancomycin (discussed with pharmacist). He is immunosuppressed with steroids and methotrexate.   Multiple episodes of VT initially, now on amiodarone  gtt 30 mg/hr + mexiletine.  Improved since started on steroids. Only few short NSVT runs overnight and occasional PVCs. He is in NSR.    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  Cardiac PET in 6/23 did not show active inflammation.  He is now being treated empirically with prednisone  30 mg daily + methotrexate 5 mg weekly, ventricular rhythm improved. Will keep this going with empiric abx.    Cardiogenic shock with AKI.  Co-ox 56% this morning on dobutamine 5, NE 30, vasopressin 0.04.  CVVH started 10/13 with intractable volume overload, AKI, and intractable hyponatremia.  Weight down 3 lbs.  He remains volume overloaded  with CVP 17 on my read.  Still with good UOP on Lasix  gtt 40 mg/hr but soft BP has limited  CVVH (getting about 60 cc/hr net negative).  - Increase dobutamine to 7.5 with marginal co-ox.  - I am going to stop Lasix  gtt for now to see if we can get more BP room.  Will aim for CVVH UF net negative 100-150 cc/hr today.     Ultimately, I think he is going to need to be considered for heart transplantation if we can get him stabilized.  I worry now with multisystem organ failure and delirium that his prognosis is increasingly poor.   CRITICAL CARE Performed by: Ezra Shuck  Total critical care time: 45 minutes  Critical care time was exclusive of separately billable procedures and treating other patients.  Critical care was necessary to treat or prevent imminent or life-threatening deterioration.  Critical care was time spent personally by me on the following activities: development of treatment plan with patient and/or surrogate as well as nursing, discussions with consultants, evaluation of patient's response to treatment, examination of patient, obtaining history from patient or surrogate, ordering and performing treatments and interventions, ordering and review of laboratory studies, ordering and review of radiographic studies, pulse oximetry and re-evaluation of patient's condition.  Ezra Shuck 12/23/2023 8:52 AM

## 2023-12-23 NOTE — Evaluation (Signed)
 Clinical/Bedside Swallow Evaluation Patient Details  Name: Harold Park MRN: [id number] Date of Birth: [birth year]  Today's Date: 12/23/2023 Time: SLP Start Time (ACUTE ONLY): 1247 SLP Stop Time (ACUTE ONLY): 1300 SLP Time Calculation (min) (ACUTE ONLY): 13 min  Past Medical History:  Past Medical History:  Diagnosis Date   Chronic bronchitis    HTN (hypertension)    NICM (nonischemic cardiomyopathy) (HCC)    Obesity (BMI 30-39.9) 12/04/2015   OSA (obstructive sleep apnea) 08/19/2015   Severe with AHI 64/hr now on CPAP at 13cm H2O   PAF (paroxysmal atrial fibrillation) (HCC)    Systolic CHF, chronic (HCC)    Past Surgical History:  Past Surgical History:  Procedure Laterality Date   APPENDECTOMY     ATRIAL FIBRILLATION ABLATION N/A 12/17/2022   Procedure: ATRIAL FIBRILLATION ABLATION;  Surgeon: Nancey [name], MD;  Location: [facility];  Service: Cardiovascular;  Laterality: N/A;   BRONCHIAL WASHINGS  04/24/2020   Procedure: BRONCHIAL WASHINGS;  Surgeon: [name], MD;  Location: [facility] ENDOSCOPY;  Service: Pulmonary;;   CARDIAC CATHETERIZATION     CARDIOVERSION N/A 06/19/2017   Procedure: CARDIOVERSION;  Surgeon: [name], MD;  Location: [facility] ENDOSCOPY;  Service: Cardiovascular;  Laterality: N/A;   CARDIOVERSION N/A 10/02/2022   Procedure: CARDIOVERSION;  Surgeon: [name], MD;  Location: [facility];  Service: Cardiovascular;  Laterality: N/A;   CARDIOVERSION N/A 10/20/2022   Procedure: CARDIOVERSION;  Surgeon: [name], MD;  Location: [facility];  Service: Cardiovascular;  Laterality: N/A;   CARDIOVERSION N/A 10/23/2022   Procedure: CARDIOVERSION;  Surgeon: [name], MD;  Location: [facility];  Service: Cardiovascular;  Laterality: N/A;   CARDIOVERSION N/A 11/13/2022   Procedure: CARDIOVERSION;  Surgeon: [name], MD;  Location: [facility];  Service: Cardiovascular;  Laterality: N/A;    CARDIOVERSION N/A 12/10/2023   Procedure: CARDIOVERSION;  Surgeon: Michele Richardson, DO;  Location: [facility];  Service: Cardiovascular;  Laterality: N/A;   FINE NEEDLE ASPIRATION  04/24/2020   Procedure: FINE NEEDLE ASPIRATION (FNA) LINEAR;  Surgeon: [name], MD;  Location: [facility] ENDOSCOPY;  Service: Pulmonary;;   LIPOMA RESECTION     arms   RIGHT HEART CATH N/A 10/30/2022   Procedure: RIGHT HEART CATH;  Surgeon: Gardenia Led, DO;  Location: [facility];  Service: Cardiovascular;  Laterality: N/A;   RIGHT HEART CATH AND CORONARY ANGIOGRAPHY N/A 12/15/2023   Procedure: RIGHT HEART CATH AND CORONARY ANGIOGRAPHY;  Surgeon: Zenaida Morene PARAS, MD;  Location: [facility];  Service: Cardiovascular;  Laterality: N/A;   SPLENECTOMY     SPLIT NIGHT STUDY  08/12/2015   VIDEO BRONCHOSCOPY WITH ENDOBRONCHIAL ULTRASOUND N/A 04/24/2020   Procedure: VIDEO BRONCHOSCOPY WITH ENDOBRONCHIAL ULTRASOUND;  Surgeon: [name], MD;  Location: [facility] ENDOSCOPY;  Service: Pulmonary;  Laterality: N/A;   HPI:  58 y.o. male admitted 10/3 with SOB, weight gain. Found to have decompensated HF / cardiogenic shock, AFwRVR / AFL s/p DCCV 12/10/23. CRRT started 10/13. Pt had VT with DCCV on 10/4. Possible cardiac sarcoidosis. Cortrak 10/15. PMH: HFrEF, PAF, OSA, VT, CKD Stage IIIa.    Assessment / Plan / Recommendation  Clinical Impression  Pt presents with functional oropharyngeal swallowing with soft purees and thin liquids.  He was not alert enough to eat regular solids, so they were deferred. Mental status is the primary obstacle to motivation and safety when eating.  He demonstrated overall  slowed function and response time, but there were no concerns for aspiration.  Recommend downgrading diet to full liquids until mentation improves. Pt's wife and RN agree with plan.  SLP will follow.   SLP Visit Diagnosis: Dysphagia, unspecified (R13.10)    Aspiration Risk   (unknown)    Diet Recommendation    Other (Comment) (full  liquids)  Medication Administration: Whole meds with puree    Other  Recommendations Oral Care Recommendations: Oral care BID     Assistance Recommended at Discharge    Functional Status Assessment Patient has had a recent decline in their functional status and demonstrates the ability to make significant improvements in function in a reasonable and predictable amount of time.  Frequency and Duration min 2x/week  2 weeks       Prognosis Prognosis for improved oropharyngeal function: Good      Swallow Study   General Date of Onset: 12/11/23 HPI: 58 y.o. male admitted 10/3 with SOB, weight gain. Found to have decompensated HF / cardiogenic shock, AFwRVR / AFL s/p DCCV 12/10/23. CRRT started 10/13. Pt had VT with DCCV on 10/4. Possible cardiac sarcoidosis. PMH: HFrEF, PAF, OSA, VT, CKD Stage IIIa. Type of Study: Bedside Swallow Evaluation Previous Swallow Assessment: no Diet Prior to this Study: Regular;Thin liquids (Level 0) Temperature Spikes Noted: No Respiratory Status: Room air History of Recent Intubation: No Behavior/Cognition: Lethargic/Drowsy Oral Cavity Assessment: Within Functional Limits Oral Care Completed by SLP: Recent completion by staff Oral Cavity - Dentition: Adequate natural dentition Self-Feeding Abilities: Needs assist Patient Positioning: Upright in bed Baseline Vocal Quality: Normal Volitional Swallow: Able to elicit    Oral/Motor/Sensory Function Overall Oral Motor/Sensory Function: Within functional limits   Ice Chips Ice chips: Within functional limits   Thin Liquid Thin Liquid: Within functional limits    Nectar Thick Nectar Thick Liquid: Not tested   Honey Thick Honey Thick Liquid: Not tested   Puree Puree: Within functional limits   Solid     Solid: Not tested      Harold Park 12/23/2023,2:47 PM  [name], MA CCC/SLP Clinical Specialist - Acute Care SLP Acute Rehabilitation Services Office  number [phone]

## 2023-12-23 NOTE — Progress Notes (Signed)
 PHARMACY - ANTICOAGULATION CONSULT NOTE  Pharmacy Consult for heparin  Indication: atrial fibrillation  Allergies  Allergen Reactions   Bee Venom Anaphylaxis    Patient Measurements: Height: 6' 5 (195.6 cm) Weight: (!) 146 kg (321 lb 14 oz) IBW/kg (Calculated) : 89.1 HEPARIN  DW (KG): 119  Vital Signs: Temp: 98.5 F (36.9 C) (10/15 0400) Temp Source: Axillary (10/15 0400) Pulse Rate: 100 (10/15 0700)  Labs: Recent Labs    12/21/23 0454 12/21/23 0810 12/21/23 1138 12/21/23 1259 12/22/23 0432 12/22/23 1613 12/23/23 0411  HGB 15.0  --  17.7*  --  16.8  --  16.6  HCT 42.4  --  52.0  --  47.1  --  47.6  PLT 143*  --   --   --  140*  --  119*  HEPARINUNFRC 0.52  --   --   --  0.40  --  0.41  CREATININE 2.95*   < >  --    < > 1.93* 1.86* 1.77*   < > = values in this interval not displayed.    Estimated Creatinine Clearance: 71.1 mL/min (A) (by C-G formula based on SCr of 1.77 mg/dL (H)).   Medications:   Infusions:   sodium chloride  20 mL/hr at 12/23/23 0700   amiodarone  30 mg/hr (12/23/23 0700)   DOBUTamine 5 mcg/kg/min (12/23/23 0700)   heparin  1,450 Units/hr (12/23/23 0700)   norepinephrine  (LEVOPHED ) Adult infusion 29 mcg/min (12/23/23 0700)   prismasol BGK 4/2.5 400 mL/hr at 12/23/23 0258   prismasol BGK 4/2.5 400 mL/hr at 12/23/23 0302   prismasol BGK 4/2.5 1,500 mL/hr at 12/23/23 0656   vasopressin 0.04 Units/min (12/23/23 0700)    Assessment: 58 YOM admitted with atrial fibrillation with rapid ventricular response. Patient underwent DCCV on 12/10/23 prior to this admission. Patient was shocked on 12/12/23 PM, now in NSR. Patient on Eliquis  prior to hospitalization, last dose on 12/11/23. Plan to start heparin  infusion prior to possible procedures. Pharmacy consulted for heparin  dosing.   Heparin  level is therapeutic at 0.41, on heparin  1450 units/hr. No issues with infusion running or signs of bleeding per RN. CBC stable (Hgb 16.6, PLT trending down to 119).    Goal of Therapy:  Heparin  level 0.3-0.7 units/ml Monitor platelets by anticoagulation protocol: Yes   Plan:  Continue heparin  infusion at 1450 units/hr Monitor heparin  level, CBC, and s/sx of bleeding daily  Thank you for allowing pharmacy to participate in this patient's care,  Suzen Sour, PharmD, BCCCP Clinical Pharmacist  Phone: [phone] 12/23/2023 7:30 AM  Please check AMION for all [facility] phone numbers After 10:00 PM, call Main Pharmacy [phone]

## 2023-12-23 NOTE — Procedures (Signed)
 I was present at this session of CRRT. I have reviewed the session itself and made appropriate changes.  Discussed with advanced heart failure team, will stop lasix  drip and rely solely on CRRT for now given low bp's and inability to UF more than 60 mL/hr.  Vital signs in last 24 hours:  Temp:  [96.9 F (36.1 C)-98.5 F (36.9 C)] 98.5 F (36.9 C) (10/15 0400) Pulse Rate:  [87-110] 100 (10/15 0700) Resp:  [18-33] 26 (10/15 0700) SpO2:  [91 %-98 %] 95 % (10/15 0700) Arterial Line BP: (72-99)/(44-79) 84/62 (10/15 0700) Weight:  [146 kg] 146 kg (10/15 0500) Weight change: -1.1 kg Filed Weights   12/21/23 0602 12/22/23 0500 12/23/23 0500  Weight: (!) 149.3 kg (!) 147.1 kg (!) 146 kg    Recent Labs  Lab 12/23/23 0411  NA 129*  K 4.5  CL 99  CO2 18*  GLUCOSE 106*  BUN 36*  CREATININE 1.77*  CALCIUM  8.1*  PHOS 2.6    Recent Labs  Lab 12/16/23 0949 12/18/23 0506 12/21/23 0454 12/21/23 1138 12/22/23 0432 12/23/23 0411  WBC 11.9*   < > 15.2*  --  14.9* 18.4*  NEUTROABS 11.1*  --   --   --   --   --   HGB 14.4  12.7*   < > 15.0 17.7* 16.8 16.6  HCT 41.9   < > 42.4 52.0 47.1 47.6  MCV 85.3   < > 82.5  --  81.3 82.5  PLT 198   < > 143*  --  140* 119*   < > = values in this interval not displayed.    Scheduled Meds:  calcium  citrate  200 mg of elemental calcium  Oral Daily   Chlorhexidine  Gluconate Cloth  6 each Topical Daily   cholecalciferol  1,000 Units Oral Daily   dapsone  100 mg Oral Daily   DULoxetine   20 mg Oral Daily   feeding supplement  237 mL Oral TID BM   insulin  aspart  0-20 Units Subcutaneous TID WC   insulin  aspart  0-5 Units Subcutaneous QHS   insulin  glargine  15 Units Subcutaneous QHS   melatonin  3 mg Oral QHS   methotrexate  5 mg Oral Weekly   mexiletine  150 mg Oral Q8H   multivitamin  1 tablet Oral QHS   pantoprazole  40 mg Oral Daily   polyethylene glycol  17 g Oral BID   predniSONE   30 mg Oral Q breakfast   senna  2 tablet Oral Daily    sodium chloride  flush  10-40 mL Intracatheter Q12H   sodium chloride  flush  10-40 mL Intracatheter Q12H   sodium chloride  flush  3 mL Intravenous Q12H   tamsulosin   0.4 mg Oral QPC supper   traZODone  50 mg Oral QHS   Continuous Infusions:  sodium chloride  20 mL/hr at 12/23/23 0815   amiodarone  30 mg/hr (12/23/23 0815)   DOBUTamine 7.5 mcg/kg/min (12/23/23 0815)   heparin  1,450 Units/hr (12/23/23 0815)   norepinephrine  (LEVOPHED ) Adult infusion 30 mcg/min (12/23/23 0815)   prismasol BGK 4/2.5 400 mL/hr at 12/23/23 0258   prismasol BGK 4/2.5 400 mL/hr at 12/23/23 0302   prismasol BGK 4/2.5 1,500 mL/hr at 12/23/23 0656   vasopressin 0.04 Units/min (12/23/23 0815)   PRN Meds:.acetaminophen  **OR** acetaminophen , heparin , heparin , mouth rinse, oxyCODONE , prochlorperazine, sodium chloride  flush, sodium chloride  flush    Assessment/Plan:  AKI/CKD stage IIIa - in setting of biventricular failure and ongoing diuresis +/- IV contrast for  heart cath on 12/15/23.  He is not responding well to maximal medical therapy, although his Na and Scr have slightly improved.  RIJ Temp HD cath placed 12/21/23 by PCCM and initiated CRRT to help manage his volume and hyponatremia. Able to UF 4.5L with CRRT and 3.5 with UOP, CVP now 17.  All fluids 4K/2.5Ca, no heparin  since he is on systemic heparin  for cardiac issues.  UF goal 50-150 mL/hr.  Will continue with current settings.  Continue with lasix  gtt for now as well. Avoid nephrotoxic medications including NSAIDs and iodinated intravenous contrast exposure unless the latter is absolutely indicated.   Preferred narcotic agents for pain control are hydromorphone, fentanyl , and methadone. Morphine should not be used.  Avoid Baclofen and avoid oral sodium phosphate  and magnesium citrate based laxatives / bowel preps.  Continue strict Input and Output monitoring. Will monitor the patient closely with you and intervene or adjust therapy as indicated by changes in clinical  status/labs  Acute on chronic biventricular heart failure - normal cardiac cath on 12/15/23.  CVP remains elevated.  Currently on IV lasix  gtt at 40 mg/hr, milrinone , levophed , and vasopressin.  UOP down to 1600 overnight but leaked around foley.  Will stop lasix  drip and follow.  GDMT limited by hypotension. 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  Started on pulse steroids now on methotrexate and prednisone .  Plan per advanced heart failure team. Persistent atrial fibrillation - despite ablation and DCCV.  Currently on IV amiodarone .  EP following. H/o V tach - lidocaine  gtt stopped 12/17/23.  Now on mexiletine.  EP following. OSA - continue with CPAP Hypervolemic hyponatremia - also with AKI.  Improved with CRRT.  Harold DELENA Sellar,  MD 12/23/2023, 8:37 AM

## 2023-12-24 DIAGNOSIS — I5082 Biventricular heart failure: Secondary | ICD-10-CM | POA: Diagnosis not present

## 2023-12-24 DIAGNOSIS — J9601 Acute respiratory failure with hypoxia: Secondary | ICD-10-CM | POA: Diagnosis not present

## 2023-12-24 DIAGNOSIS — E871 Hypo-osmolality and hyponatremia: Secondary | ICD-10-CM

## 2023-12-24 DIAGNOSIS — N179 Acute kidney failure, unspecified: Secondary | ICD-10-CM | POA: Diagnosis not present

## 2023-12-24 DIAGNOSIS — I4891 Unspecified atrial fibrillation: Secondary | ICD-10-CM | POA: Diagnosis not present

## 2023-12-24 DIAGNOSIS — E440 Moderate protein-calorie malnutrition: Secondary | ICD-10-CM | POA: Insufficient documentation

## 2023-12-24 DIAGNOSIS — I5023 Acute on chronic systolic (congestive) heart failure: Secondary | ICD-10-CM | POA: Diagnosis not present

## 2023-12-24 LAB — RENAL FUNCTION PANEL
Albumin: 3 g/dL — ABNORMAL LOW (ref 3.5–5.0)
Albumin: 2.8 g/dL — ABNORMAL LOW (ref 3.5–5.0)
Anion gap: 10 (ref 5–15)
Anion gap: 10 (ref 5–15)
BUN: 35 mg/dL — ABNORMAL HIGH (ref 6–20)
BUN: 34 mg/dL — ABNORMAL HIGH (ref 6–20)
CO2: 18 mmol/L — ABNORMAL LOW (ref 22–32)
CO2: 23 mmol/L (ref 22–32)
Calcium: 8 mg/dL — ABNORMAL LOW (ref 8.9–10.3)
Calcium: 7.8 mg/dL — ABNORMAL LOW (ref 8.9–10.3)
Chloride: 101 mmol/L (ref 98–111)
Chloride: 100 mmol/L (ref 98–111)
Creatinine, Ser: 1.82 mg/dL — ABNORMAL HIGH (ref 0.61–1.24)
Creatinine, Ser: 1.8 mg/dL — ABNORMAL HIGH (ref 0.61–1.24)
GFR, Estimated: 42 mL/min — ABNORMAL LOW (ref >60)
GFR, Estimated: 43 mL/min — ABNORMAL LOW (ref >60)
Glucose, Bld: 218 mg/dL — ABNORMAL HIGH (ref 70–99)
Glucose, Bld: 210 mg/dL — ABNORMAL HIGH (ref 70–99)
Phosphorus: 2.8 mg/dL (ref 2.5–4.6)
Phosphorus: 2.7 mg/dL (ref 2.5–4.6)
Potassium: 4.4 mmol/L (ref 3.5–5.1)
Potassium: 4.5 mmol/L (ref 3.5–5.1)
Sodium: 129 mmol/L — ABNORMAL LOW (ref 135–145)
Sodium: 133 mmol/L — ABNORMAL LOW (ref 135–145)

## 2023-12-24 LAB — COOXEMETRY PANEL
Carboxyhemoglobin: 1.6 % — ABNORMAL HIGH (ref 0.5–1.5)
Carboxyhemoglobin: 1.9 % — ABNORMAL HIGH (ref 0.5–1.5)
Carboxyhemoglobin: 0.9 % (ref 0.5–1.5)
Methemoglobin: 1.6 % — ABNORMAL HIGH (ref 0.0–1.5)
Methemoglobin: <0.7 % (ref 0.0–1.5)
Methemoglobin: 1.1 % (ref 0.0–1.5)
O2 Saturation: 58.2 %
O2 Saturation: 53.8 %
O2 Saturation: 55.1 %
Total hemoglobin: 15.1 g/dL (ref 12.0–16.0)
Total hemoglobin: 14.7 g/dL (ref 12.0–16.0)
Total hemoglobin: 16.6 g/dL — ABNORMAL HIGH (ref 12.0–16.0)

## 2023-12-24 LAB — CBC
HCT: 47.3 % (ref 39.0–52.0)
Hemoglobin: 16.2 g/dL (ref 13.0–17.0)
MCH: 28.7 pg (ref 26.0–34.0)
MCHC: 34.2 g/dL (ref 30.0–36.0)
MCV: 83.9 fL (ref 80.0–100.0)
Platelets: 116 K/uL — ABNORMAL LOW (ref 150–400)
RBC: 5.64 MIL/uL (ref 4.22–5.81)
RDW: 15.7 % — ABNORMAL HIGH (ref 11.5–15.5)
WBC: 26.2 K/uL — ABNORMAL HIGH (ref 4.0–10.5)
nRBC: 1.3 % — ABNORMAL HIGH (ref 0.0–0.2)

## 2023-12-24 LAB — MULTIPLE MYELOMA PANEL, SERUM
Albumin SerPl Elph-Mcnc: 3.1 g/dL (ref 2.9–4.4)
Albumin/Glob SerPl: 1.2 (ref 0.7–1.7)
Alpha 1: 0.3 g/dL (ref 0.0–0.4)
Alpha2 Glob SerPl Elph-Mcnc: 0.6 g/dL (ref 0.4–1.0)
B-Globulin SerPl Elph-Mcnc: 0.9 g/dL (ref 0.7–1.3)
Gamma Glob SerPl Elph-Mcnc: 1 g/dL (ref 0.4–1.8)
Globulin, Total: 2.8 g/dL (ref 2.2–3.9)
IgA: 352 mg/dL (ref 90–386)
IgG (Immunoglobin G), Serum: 1107 mg/dL (ref 603–1613)
IgM (Immunoglobulin M), Srm: 49 mg/dL (ref 20–172)
Total Protein ELP: 5.9 g/dL — ABNORMAL LOW (ref 6.0–8.5)

## 2023-12-24 LAB — UPEP/UIFE/LIGHT CHAINS/TP, 24-HR UR
% BETA, Urine: 13.5 %
ALPHA 1 URINE: 4.1 %
Albumin, U: 55.5 %
Alpha 2, Urine: 10.2 %
Free Kappa Lt Chains,Ur: 12.94 mg/L (ref 1.17–86.46)
Free Kappa/Lambda Ratio: 4.92 (ref 1.83–14.26)
Free Lambda Lt Chains,Ur: 2.63 mg/L (ref 0.27–15.21)
GAMMA GLOBULIN URINE: 16.8 %
Total Protein, Urine-Ur/day: 276 mg/(24.h) — ABNORMAL HIGH (ref 30–150)
Total Protein, Urine: 25.1 mg/dL
Total Volume: 1100

## 2023-12-24 LAB — CG4 I-STAT (LACTIC ACID)
Lactic Acid, Venous: 1.9 mmol/L (ref 0.5–1.9)
Lactic Acid, Venous: 1.9 mmol/L (ref 0.5–1.9)
Lactic Acid, Venous: 2 mmol/L (ref 0.5–1.9)

## 2023-12-24 LAB — GLUCOSE, CAPILLARY
Glucose-Capillary: 202 mg/dL — ABNORMAL HIGH (ref 70–99)
Glucose-Capillary: 207 mg/dL — ABNORMAL HIGH (ref 70–99)
Glucose-Capillary: 196 mg/dL — ABNORMAL HIGH (ref 70–99)
Glucose-Capillary: 189 mg/dL — ABNORMAL HIGH (ref 70–99)
Glucose-Capillary: 221 mg/dL — ABNORMAL HIGH (ref 70–99)
Glucose-Capillary: 225 mg/dL — ABNORMAL HIGH (ref 70–99)

## 2023-12-24 LAB — AMMONIA: Ammonia: 51 umol/L — ABNORMAL HIGH (ref 9–35)

## 2023-12-24 LAB — HEPARIN LEVEL (UNFRACTIONATED): Heparin Unfractionated: 0.41 [IU]/mL (ref 0.30–0.70)

## 2023-12-24 LAB — MAGNESIUM: Magnesium: 2.7 mg/dL — ABNORMAL HIGH (ref 1.7–2.4)

## 2023-12-24 LAB — PROCALCITONIN: Procalcitonin: 0.22 ng/mL

## 2023-12-24 MED ORDER — INSULIN ASPART 100 UNIT/ML IJ SOLN
3.0000 [IU] | INTRAMUSCULAR | Status: DC
  Administered 2023-12-24 – 2023-12-25 (×6): 3 [IU] via SUBCUTANEOUS

## 2023-12-24 MED ORDER — ENSURE PLUS HIGH PROTEIN PO LIQD: 237.0000 mL | Freq: Three times a day (TID) | ORAL | Status: DC

## 2023-12-24 MED ORDER — DAPSONE 100 MG PO TABS
100.0000 mg | ORAL_TABLET | Freq: Every day | ORAL | Status: DC
Start: 2023-12-24 — End: 2023-12-30
  Administered 2023-12-24 – 2023-12-29 (×6): 100 mg
  Filled 2023-12-24 (×7): qty 1

## 2023-12-24 MED ORDER — PANTOPRAZOLE SODIUM 40 MG IV SOLR
40.0000 mg | INTRAVENOUS | Status: DC
  Administered 2023-12-24 – 2023-12-29 (×6): 40 mg via INTRAVENOUS
  Filled 2023-12-24 (×6): qty 10

## 2023-12-24 MED ORDER — PREDNISONE 20 MG PO TABS
30.0000 mg | ORAL_TABLET | Freq: Every day | ORAL | Status: DC
  Administered 2023-12-24 – 2023-12-29 (×6): 30 mg
  Filled 2023-12-24 (×5): qty 2

## 2023-12-24 MED ORDER — ACETAMINOPHEN 325 MG PO TABS
650.0000 mg | ORAL_TABLET | Freq: Four times a day (QID) | ORAL | Status: DC | PRN
  Administered 2023-12-25: 650 mg
  Filled 2023-12-24: qty 2

## 2023-12-24 MED ORDER — MELATONIN 5 MG PO TABS
5.0000 mg | ORAL_TABLET | Freq: Every day | ORAL | Status: DC
  Administered 2023-12-24 – 2023-12-28 (×5): 5 mg
  Filled 2023-12-24 (×5): qty 1

## 2023-12-24 MED ORDER — CALCIUM CITRATE 950 (200 CA) MG PO TABS
200.0000 mg | ORAL_TABLET | Freq: Every day | ORAL | Status: DC
  Administered 2023-12-24 – 2023-12-29 (×6): 950 mg
  Filled 2023-12-24 (×7): qty 1

## 2023-12-24 MED ORDER — EPINEPHRINE HCL 5 MG/250ML IV SOLN IN NS
6.0000 ug/min | INTRAVENOUS | Status: DC
  Administered 2023-12-24: 1 ug/min via INTRAVENOUS
  Administered 2023-12-25 – 2023-12-26 (×2): 6 ug/min via INTRAVENOUS
  Filled 2023-12-24 (×3): qty 250

## 2023-12-24 MED ORDER — MEXILETINE HCL 150 MG PO CAPS
150.0000 mg | ORAL_CAPSULE | Freq: Three times a day (TID) | ORAL | Status: DC
Start: 2023-12-24 — End: 2023-12-30
  Administered 2023-12-24 – 2023-12-29 (×16): 150 mg
  Filled 2023-12-24 (×16): qty 1

## 2023-12-24 MED ORDER — OXYCODONE HCL 5 MG PO TABS
5.0000 mg | ORAL_TABLET | ORAL | Status: DC | PRN
  Administered 2023-12-26: 5 mg
  Filled 2023-12-24: qty 1

## 2023-12-24 MED ORDER — ACETAMINOPHEN 650 MG RE SUPP: 650.0000 mg | Freq: Four times a day (QID) | RECTAL | Status: DC | PRN

## 2023-12-24 MED ORDER — VITAMIN D 25 MCG (1000 UNIT) PO TABS
1000.0000 [IU] | ORAL_TABLET | Freq: Every day | ORAL | Status: DC
  Administered 2023-12-24 – 2023-12-29 (×6): 1000 [IU]
  Filled 2023-12-24 (×6): qty 1

## 2023-12-24 MED ORDER — RIFAXIMIN 550 MG PO TABS
550.0000 mg | ORAL_TABLET | Freq: Two times a day (BID) | ORAL | Status: DC
  Administered 2023-12-24 – 2023-12-29 (×11): 550 mg
  Filled 2023-12-24 (×11): qty 1

## 2023-12-24 MED ORDER — ORAL CARE MOUTH RINSE
15.0000 mL | OROMUCOSAL | Status: DC
  Administered 2023-12-24 – 2023-12-29 (×22): 15 mL via OROMUCOSAL

## 2023-12-24 MED ORDER — SODIUM CHLORIDE 0.9 % IV SOLN: INTRAVENOUS | Status: AC

## 2023-12-24 MED ORDER — RENA-VITE PO TABS
1.0000 | ORAL_TABLET | Freq: Every day | ORAL | Status: DC
  Administered 2023-12-24 – 2023-12-28 (×5): 1
  Filled 2023-12-24 (×5): qty 1

## 2023-12-24 MED ORDER — ORAL CARE MOUTH RINSE
15.0000 mL | OROMUCOSAL | Status: DC | PRN
Start: 2023-12-24 — End: 2023-12-30

## 2023-12-24 MED ORDER — TRAZODONE HCL 50 MG PO TABS
25.0000 mg | ORAL_TABLET | Freq: Every evening | ORAL | Status: DC | PRN
  Administered 2023-12-24 – 2023-12-28 (×5): 25 mg
  Filled 2023-12-24 (×5): qty 1

## 2023-12-24 NOTE — Procedures (Signed)
 I was present at this session of CRRT. I have reviewed the session itself and made appropriate changes.   Vital signs in last 24 hours:  Temp:  [98.5 F (36.9 C)-99.5 F (37.5 C)] 99.5 F (37.5 C) (10/16 0400) Pulse Rate:  [86-108] 101 (10/16 0815) Resp:  [15-30] 25 (10/16 0815) SpO2:  [89 %-99 %] 98 % (10/16 0815) Arterial Line BP: (71-101)/(47-65) 86/60 (10/16 0815) Weight:  [144 kg] 144 kg (10/16 0500) Weight change: -2 kg Filed Weights   12/22/23 0500 12/23/23 0500 12/24/23 0500  Weight: (!) 147.1 kg (!) 146 kg (!) 144 kg    Recent Labs  Lab 12/24/23 0425  NA 129*  K 4.4  CL 101  CO2 18*  GLUCOSE 218*  BUN 35*  CREATININE 1.82*  CALCIUM  8.0*  PHOS 2.8    Recent Labs  Lab 12/22/23 0432 12/23/23 0411 12/23/23 2041 12/24/23 0425  WBC 14.9* 18.4*  --  26.2*  HGB 16.8 16.6 17.0 16.2  HCT 47.1 47.6 50.0 47.3  MCV 81.3 82.5  --  83.9  PLT 140* 119*  --  116*    Scheduled Meds:  calcium  citrate  200 mg of elemental calcium  Per Tube Daily   Chlorhexidine  Gluconate Cloth  6 each Topical Daily   cholecalciferol  1,000 Units Per Tube Daily   dapsone  100 mg Per Tube Daily   feeding supplement  237 mL Oral TID BM   feeding supplement (PROSource TF20)  60 mL Per Tube TID   insulin  aspart  0-9 Units Subcutaneous Q4H   insulin  aspart  3 Units Subcutaneous Q4H   insulin  glargine-yfgn  20 Units Subcutaneous QHS   methotrexate  5 mg Oral Weekly   mexiletine  150 mg Per Tube Q8H   multivitamin  1 tablet Per Tube QHS   pantoprazole (PROTONIX) IV  40 mg Intravenous Q24H   predniSONE   30 mg Per Tube Q breakfast   rifaximin  550 mg Per Tube BID   sodium chloride  flush  10-40 mL Intracatheter Q12H   sodium chloride  flush  10-40 mL Intracatheter Q12H   sodium chloride  flush  3 mL Intravenous Q12H   Continuous Infusions:  sodium chloride  20 mL/hr at 12/24/23 0800   amiodarone  30 mg/hr (12/24/23 0800)   DOBUTamine 7.5 mcg/kg/min (12/24/23 0800)   epinephrine 1 mcg/min  (12/24/23 0800)   feeding supplement (VITAL 1.5 CAL) 40 mL/hr at 12/24/23 0800   heparin  1,450 Units/hr (12/24/23 0806)   norepinephrine  (LEVOPHED ) Adult infusion 44 mcg/min (12/24/23 0800)   piperacillin-tazobactam Stopped (12/24/23 0444)   prismasol BGK 4/2.5 400 mL/hr at 12/24/23 0436   prismasol BGK 4/2.5 400 mL/hr at 12/24/23 0438   prismasol BGK 4/2.5 1,500 mL/hr at 12/24/23 9357   vancomycin     vasopressin 0.04 Units/min (12/24/23 0800)   PRN Meds:.acetaminophen  **OR** acetaminophen , heparin , heparin , mouth rinse, oxyCODONE , prochlorperazine, sodium chloride  flush, sodium chloride  flush    Assessment/Plan:  AKI/CKD stage IIIa - in setting of biventricular failure and ongoing diuresis +/- IV contrast for heart cath on 12/15/23.  He is not responding well to maximal medical therapy, although his Na and Scr have slightly improved.  RIJ Temp HD cath placed 12/21/23 by PCCM and initiated CRRT to help manage his volume and hyponatremia. Able to UF 4.5L with CRRT and 3.5 with UOP, CVP now 17.  All fluids 4K/2.5Ca, no heparin  since he is on systemic heparin  for cardiac issues.  UF goal 50-150 mL/hr, but only able to set 100  mL/hr due to low MAP.  Will continue with current settings.  Continue with lasix  gtt for now as well. Avoid nephrotoxic medications including NSAIDs and iodinated intravenous contrast exposure unless the latter is absolutely indicated.   Preferred narcotic agents for pain control are hydromorphone, fentanyl , and methadone. Morphine should not be used.  Avoid Baclofen and avoid oral sodium phosphate  and magnesium citrate based laxatives / bowel preps.  Continue strict Input and Output monitoring. Will monitor the patient closely with you and intervene or adjust therapy as indicated by changes in clinical status/labs  Acute on chronic biventricular heart failure - normal cardiac cath on 12/15/23.  CVP remains elevated.  Currently on IV lasix  gtt at 40 mg/hr, milrinone , levophed ,  and vasopressin.  UOP down to 1600 overnight but leaked around foley.  Stopped lasix  drip on 12/23/23 with drop in UOP.  GDMT limited by hypotension.  Epi added to drips per advanced heart failure team. 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  Started on pulse steroids now on methotrexate and prednisone .  Plan per advanced heart failure team. Persistent atrial fibrillation - despite ablation and DCCV.  Currently on IV amiodarone .  EP following. H/o V tach - lidocaine  gtt stopped 12/17/23.  Now on mexiletine.  EP following. OSA - continue with CPAP Hypervolemic hyponatremia - also with AKI.  Improved with CRRT but stuck at 129. Disposition - prognosis is guarded as he has not really improved since starting CRRT.  Need to discuss goals of care with family.  Fairy Harold Sellar,  MD 12/24/2023, 8:47 AM

## 2023-12-24 NOTE — Progress Notes (Signed)
 PHARMACY - ANTICOAGULATION CONSULT NOTE  Pharmacy Consult for heparin  Indication: atrial fibrillation  Allergies  Allergen Reactions   Bee Venom Anaphylaxis    Patient Measurements: Height: 6' 5 (195.6 cm) Weight: (!) 144 kg (317 lb 7.4 oz) (WITH FOOTBOARD) IBW/kg (Calculated) : 89.1 HEPARIN  DW (KG): 119  Vital Signs: Temp: 99.5 F (37.5 C) (10/16 0400) Temp Source: Axillary (10/16 0400) Pulse Rate: 99 (10/16 0700)  Labs: Recent Labs    12/22/23 0432 12/22/23 1613 12/23/23 0411 12/23/23 1600 12/23/23 2041 12/24/23 0425  HGB 16.8  --  16.6  --  17.0 16.2  HCT 47.1  --  47.6  --  50.0 47.3  PLT 140*  --  119*  --   --  116*  HEPARINUNFRC 0.40  --  0.41  --   --  0.41  CREATININE 1.93*   < > 1.77* 1.92*  --  1.82*   < > = values in this interval not displayed.    Estimated Creatinine Clearance: 68.7 mL/min (A) (by C-G formula based on SCr of 1.82 mg/dL (H)).   Medications:   Infusions:   sodium chloride  20 mL/hr at 12/24/23 0700   amiodarone  30 mg/hr (12/24/23 0700)   DOBUTamine 7.5 mcg/kg/min (12/24/23 0700)   feeding supplement (VITAL 1.5 CAL) 40 mL/hr at 12/24/23 0700   heparin  1,450 Units/hr (12/24/23 0700)   norepinephrine  (LEVOPHED ) Adult infusion 40 mcg/min (12/24/23 0700)   piperacillin-tazobactam Stopped (12/24/23 0444)   prismasol BGK 4/2.5 400 mL/hr at 12/24/23 0436   prismasol BGK 4/2.5 400 mL/hr at 12/24/23 0438   prismasol BGK 4/2.5 1,500 mL/hr at 12/24/23 0642   vancomycin     vasopressin 0.04 Units/min (12/24/23 0700)    Assessment: 58 YOM admitted with atrial fibrillation with rapid ventricular response. Patient underwent DCCV on 12/10/23 prior to this admission. Patient was shocked on 12/12/23 PM, now in NSR. Patient on Eliquis  prior to hospitalization, last dose on 12/11/23. Plan to start heparin  infusion prior to possible procedures. Pharmacy consulted for heparin  dosing.   Heparin  level is therapeutic at 0.41, on heparin  1450 units/hr. No  issues with infusion running or signs of bleeding per RN. CBC stable (Hgb 16.2, PLT trending down to 116).   Goal of Therapy:  Heparin  level 0.3-0.7 units/ml Monitor platelets by anticoagulation protocol: Yes   Plan:  Continue heparin  infusion at 1450 units/hr Monitor heparin  level, CBC, and s/sx of bleeding daily  Thank you for allowing pharmacy to participate in this patient's care,  Suzen Sour, PharmD, BCCCP Clinical Pharmacist  Phone: [phone] 12/24/2023 7:20 AM  Please check AMION for all [facility] Pharmacy phone numbers After 10:00 PM, call Main Pharmacy [phone]

## 2023-12-24 NOTE — Progress Notes (Addendum)
 Advanced Heart Failure Rounding Note  Cardiologist: Wilbert Bihari, MD  Chief Complaint: A/C HF Subjective:   10/6 Started on Milirnone 0.125 for low output.  Developed VTwith shock x1. Sleeping at the time but described some chest burning. Lidocaine  added. K supplemented.  10/7 Cath - coronaries ok. Elevated filling pressures, CI 1.7. Milrinone  increased to 0.25 mcg. Started on pulse steroids for possible sarcoid 10/13: Milrinone  switched to DBA  Now on DBA @7 .5 (increased yesterday), NE at 40 and vaso at 0.04.   Coox 58%. CalcQx Fick CO/CI 3.9/1.39 SVR 1005  Ammonia 52>51. Got lactulose x1 yesterday with >3 BMs  Lactic overnight 2.1>1.8>1.9  Now off lasix  gtt. Weight down 4 lbs. - UOP, -4.2L removed on CRRT. Net (-) 1.2L. CVP 16  Very delirious overnight. Able to tell me his wifes name and his bday this morning. Mumbled answers to other questions.   Objective:   Weight Range: (!) 144 kg Body mass index is 37.65 kg/m.   Vital Signs:   Temp:  [97.6 F (36.4 C)-99.5 F (37.5 C)] 99.5 F (37.5 C) (10/16 0400) Pulse Rate:  [86-108] 99 (10/16 0700) Resp:  [15-30] 23 (10/16 0700) SpO2:  [89 %-99 %] 99 % (10/16 0700) Arterial Line BP: (71-101)/(47-66) 85/58 (10/16 0700) Weight:  [144 kg] 144 kg (10/16 0500) Last BM Date : 12/23/23  Weight change: Filed Weights   12/22/23 0500 12/23/23 0500 12/24/23 0500  Weight: (!) 147.1 kg (!) 146 kg (!) 144 kg    Intake/Output:   Intake/Output Summary (Last 24 hours) at 12/24/2023 0711 Last data filed at 12/24/2023 0700 Gross per 24 hour  Intake 4484.18 ml  Output 5764.9 ml  Net -1280.72 ml     CVP 16 Physical Exam  General:  Confused, answers some questions.  No respiratory difficulty Neck: JVD ~15 cm.  Cor: Regular rate & rhythm. No murmurs. Lungs: clear, diminished bases Extremities: trace BLE edema.  Neuro: alert & oriented x 1. Affect drowsy.   Telemetry   ST low 100s, intt PVCs/hr. (Personally reviewed)     Labs    CBC Recent Labs    12/23/23 0411 12/23/23 2041 12/24/23 0425  WBC 18.4*  --  26.2*  HGB 16.6 17.0 16.2  HCT 47.6 50.0 47.3  MCV 82.5  --  83.9  PLT 119*  --  116*   Basic Metabolic Panel Recent Labs    89/84/74 0411 12/23/23 1600 12/23/23 2041 12/24/23 0425  NA 129* 129* 132* 129*  K 4.5 4.6 4.1 4.4  CL 99 99  --  101  CO2 18* 17*  --  18*  GLUCOSE 106* 204*  --  218*  BUN 36* 36*  --  35*  CREATININE 1.77* 1.92*  --  1.82*  CALCIUM  8.1* 8.0*  --  8.0*  MG 2.5*  --   --  2.7*  PHOS 2.6 3.2  --  2.8   Liver Function Tests Recent Labs    12/21/23 0810 12/21/23 1542 12/23/23 0956 12/23/23 1600 12/24/23 0425  AST 38  --  47*  --   --   ALT 55*  --  55*  --   --   ALKPHOS 64  --  62  --   --   BILITOT 2.2*  --  4.9*  --   --   PROT 5.7*  --  5.7*  --   --   ALBUMIN  3.1*   < > 3.1* 2.9* 3.0*   < > = values  in this interval not displayed.    Medications:     Scheduled Medications:  calcium  citrate  200 mg of elemental calcium  Oral Daily   Chlorhexidine  Gluconate Cloth  6 each Topical Daily   cholecalciferol  1,000 Units Oral Daily   dapsone  100 mg Oral Daily   DULoxetine   20 mg Oral Daily   feeding supplement  237 mL Oral TID BM   feeding supplement (PROSource TF20)  60 mL Per Tube TID   insulin  aspart  0-9 Units Subcutaneous Q4H   insulin  glargine-yfgn  20 Units Subcutaneous QHS   lactulose  20 g Oral TID   melatonin  3 mg Oral QHS   methotrexate  5 mg Oral Weekly   mexiletine  150 mg Oral Q8H   multivitamin  1 tablet Oral QHS   pantoprazole  40 mg Oral Daily   polyethylene glycol  17 g Oral BID   predniSONE   30 mg Oral Q breakfast   senna  2 tablet Oral Daily   sodium chloride  flush  10-40 mL Intracatheter Q12H   sodium chloride  flush  10-40 mL Intracatheter Q12H   sodium chloride  flush  3 mL Intravenous Q12H   tamsulosin   0.4 mg Oral QPC supper   traZODone  50 mg Oral QHS    Infusions:  sodium chloride  20 mL/hr at 12/24/23 0700    amiodarone  30 mg/hr (12/24/23 0700)   DOBUTamine 7.5 mcg/kg/min (12/24/23 0700)   feeding supplement (VITAL 1.5 CAL) 40 mL/hr at 12/24/23 0700   heparin  1,450 Units/hr (12/24/23 0700)   norepinephrine  (LEVOPHED ) Adult infusion 40 mcg/min (12/24/23 0700)   piperacillin-tazobactam Stopped (12/24/23 0444)   prismasol BGK 4/2.5 400 mL/hr at 12/24/23 0436   prismasol BGK 4/2.5 400 mL/hr at 12/24/23 0438   prismasol BGK 4/2.5 1,500 mL/hr at 12/24/23 9357   vancomycin     vasopressin 0.04 Units/min (12/24/23 0700)    PRN Medications: acetaminophen  **OR** acetaminophen , heparin , heparin , mouth rinse, oxyCODONE , prochlorperazine, sodium chloride  flush, sodium chloride  flush Patient Profile  58 year old with a history of HFrEF, PAF, OSA, VT, CKD Stage IIIa.   HF following for cardiogenic shock.  Assessment/Plan   A/C HFrEF, Cardiogenic Shock  - Normal coronary arteriography 12/2023 - Cardiac PET 2023- no evidence for cardiac sarcoid or inflammatory. LVEF 38%. - cMRI 2/25 EF 28 % RV 33% LGE pattern concerning for sarcoid.  Has hilar adenopathy on lung imaging.  - Echo 12/12/23 EF < 15 severe RV HK - Coox 58%. CalcQx Fick CO/CI 3.9/1.39 SVR 1005 - Appears to be doing better from a VT standpoint. Now on DBA @7 .5 - Remains on NE as well as vasopressin for distributive shock. Will add Epi @ 1 today - WBC elevated, in the setting of steroids? Now on broad spectrum abx.  - Continue arterial line for BP monitoring - Now off IV lasix  with decreased UOP to allow for further removal from CRRT - GDMT limited by hypotension.  - Off spiro and digoxin  with elevated SCr.    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 - underwent DC-CV on 12/10/23 - Maintaining SR/ST. Continue IV amio. Of note he has previously had difficulty taking amio due to nausea.  - Continue heparin   - EP following. Potential AFL ablation (versus AVN/pacer) + ICD this admit. He is not at all optimized for this procedure   VT -S/P DC-CV  10/4  -S/P DC- CV 10/6  - Continue amio drip  - Lidocaine  gtt stopped 10/9. Now on  mexiletine.  - EP following.  - Has been electrically quiet since start of steroids  Possible Sarcoid -Consulted EP. -Now on Sarcoid Protocol. Giving pulse steroids x 3 days and on immunosuppression with methotrexate and prednisone  - No further VT - At this point will hold off on vaccines.   AKI on CKD Stage IIIa  - Creatinine baseline ~ 1.6-1.8 - Creatinine slowly downtrending, UOP finally picking up.  - Now followed by nephrology, CRRT started 10/13 - Avoid hypotension. Goal MAP >70.  - Stop IV lasix  today with decreased UOP.  - Low urine sodium.  - Urine now dark.  - Continue foley - Unna boots as tolerated  OSA  -Continue CPAP  Hyponatremia  - Na 129 - S/p 3% Q8 several days + Tolvaptan 10/13 - Follow  AMS Elevated LFTs - AST/ALT: 47/55 - Got a dose of lactulose yesterday but had >3 BMs. Switch to rifaximin today.  - Ammonia 52>51 - Continue delirium precautions - Now with cortrak - WBC 18.4>26.2. steroids? On broad spectrum abx. Blood cx 10/15 NGTD  CRITICAL CARE Performed by: Beckey LITTIE Coe   Total critical care time: 17 minutes  Critical care time was exclusive of separately billable procedures and treating other patients.  Critical care was necessary to treat or prevent imminent or life-threatening deterioration.  Critical care was time spent personally by me on the following activities: development of treatment plan with patient and/or surrogate as well as nursing, discussions with consultants, evaluation of patient's response to treatment, examination of patient, obtaining history from patient or surrogate, ordering and performing treatments and interventions, ordering and review of laboratory studies, ordering and review of radiographic studies, pulse oximetry and re-evaluation of patient's condition.  Length of Stay: 12  Beckey LITTIE Coe, NP  12/24/2023, 7:11 AM  Advanced  Heart Failure Team Pager [phone] (M-F; 7a - 5p)  Please contact [facility] for night-coverage after hours (5p -7a ) and weekends on [email or web address]  Patient seen with NP, I formulated the plan and agree with the above note.   Co-ox 58% this morning on dobutamine 7.5, NE 40, vasopressin 0.04.  Having trouble pulling fluid via CVVH due to low BP.  However, I/Os net negative 1279 for the last day.  CVP 16 this morning.  Lactate 1.9.   NH3 51, having multiple BMs so not getting lactulose.    He remains in NSR with no VT on amiodarone  and also heparin  gtt.   He is on vancomycin/Zosyn with rising WBCs to 26 today.   Lethargic, but more awake when his wife is in the room.   General: NAD Neck: JVP 16 cm, no thyromegaly or thyroid  nodule.  Lungs: Decreased BS at bases.  CV: Nondisplaced PMI.  Heart regular S1/S2, no S3/S4, no murmur. 1+ ankle edema.  Abdomen: Soft, nontender, no hepatosplenomegaly, no distention.  Skin: Intact without lesions or rashes.  Neurologic: Lethargic, wakes up and answers questions.  Extremities: No clubbing or cyanosis.  HEENT: Normal.   Mental status not improving as much as I had hoped with CVVH, still very lethargic. Suspect liver failure with elevated NH3 and component of hepatic encephalopathy.  NH3 51 today, has not been able to get lactulose due to frequent BMs.  - Will start rifaximin.     WBCs continue to rise to 26K.  He is immunosuppressed with steroids and methotrexate.  With low BP, cultures sent and we are covering empirically with Zosyn and vancomycin.    Multiple episodes of VT initially, now  on amiodarone  gtt 30 mg/hr + mexiletine.  Improved since started on steroids. Only intermittent PVCs. He is in NSR.    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  Cardiac PET in 6/23 did not show active inflammation.  He is now being  treated empirically with prednisone  30 mg daily + methotrexate 5 mg weekly, ventricular rhythm improved. Will keep this going with empiric abx.    Cardiogenic shock with AKI.  Co-ox 58% this morning on dobutamine 7.5, NE 40, vasopressin 0.04. Pressor requirement has been gradually escalating. CVVH started 10/13 with intractable volume overload, AKI, and intractable hyponatremia.  Weight down 4 lbs.  He remains volume overloaded with CVP 16 on my read.  Moving slowly with CVVH given low BP.  UOP has slowed considerably.  Lactate normal.  - Low CI calculated from co-ox.  Will add epinephrine 1 today and increase to 2 if rhythm remains stable.  - Continue CVVH UF net negative 50-100 cc/hr today as BP tolerates.     I worry now with multisystem organ failure (heart, liver, kidney) and delirium that his prognosis is increasingly poor.  He is not an LVAD candidate and I do not think that he is a transplant candidate at this point.  I would not place him on [place] [facility] as I do not think we have an end point for him.  Will continue current management with CVVH trying slowly pull volume with pressors support and broad spectrum antibiotics.  I spoke with his wife today and told her that I worry about our ability to pull him through this hospitalization.   CRITICAL CARE Performed by: Ezra Shuck  Total critical care time: 45 minutes  Critical care time was exclusive of separately billable procedures and treating other patients.  Critical care was necessary to treat or prevent imminent or life-threatening deterioration.  Critical care was time spent personally by me on the following activities: development of treatment plan with patient and/or surrogate as well as nursing, discussions with consultants, evaluation of patient's response to treatment, examination of patient, obtaining history from patient or surrogate, ordering and performing treatments and interventions, ordering and review of laboratory studies,  ordering and review of radiographic studies, pulse oximetry and re-evaluation of patient's condition.  Ezra Shuck 12/24/2023 9:39 AM

## 2023-12-24 NOTE — Progress Notes (Signed)
 Called by RN before midnight with concern that patient a bit more confused and lactate going up (2.1 from normal). Based on notes, patients has been confused during day shift in similar manner to that found during my assessment. However, pressor requirements also going up with difficulty maintaining a negative UF. NE dose from high 20s to 40 but still unable to keep negative. I lowered the dobutamine dose from 7.5->5 in an attempt to stabilize NE requirements and allow for UF but no change, so dobuta changed back to 7.5. Ultimately able to achieve a UF of 50 with NE dose in the mid 40s. Lactate normalized on 2 checks remainder of the night. Given escalating pressor requirements on 3 agents and difficulty with UF may need to consider additional support.

## 2023-12-24 NOTE — Progress Notes (Signed)
 Spoke to [employer] at this time in regards to placing [employer] on patient per provider order.  Verified that patient does not have wounds on BLE. Skin is CDI.  Tech states that they would work patient in this afternoon. No further questions.

## 2023-12-24 NOTE — Consult Note (Signed)
 NAME:  Harold Park, MRN:  [id number], DOB:  1965/09/09, LOS: 12 ADMISSION DATE:  12/11/2023, CONSULTATION DATE:  10/16 REFERRING MD:  Vergil, CHIEF COMPLAINT:  multiple organ failure and shock    History of Present Illness:  58 year old male patient with history of heart failure with reduced ejection fraction (EF 35%), chronic atrial fibrillation/flutter status post previous attempts at cardioversion and ablation, initially admitted on 10/3 after being treated in the outpatient setting for ongoing atrial flutter and VT which she was cardioverted on 10/2, in the days following developed progressive increased shortness of breath, orthopnea, paroxysmal nocturnal dyspnea, intermittent nausea, and 10 pound weight gain.  He had not responded to diuretics in the outpatient setting, had mild elevated lactate at 2.1, and new transthoracic echocardiogram showing EF of 10% with biatrial dilation and severely dilated LV.  He was admitted to the advanced heart failure service, placed on Lasix  and milrinone  infusion admitted to the intensive care.    10/6 he underwent left and right heart cath.  His right atrial pressure was 20 mmHg, RV 50/14 PA 50/32 with mean 38, pulmonary capillary wedge pressure of 32 mmHg, estimated Fick was 5.95 L/min with cardiac index at 2.24, thermodilution readings: Cardiac output 4.5 with index of 1.7.  Angiography showed sluggish coronary flow felt secondary to catheter size and severely elevated LVEDP but showed no arteriosclerotic disease he has severely elevated biventricular filling pressures with severely reduced papi score of less than 1.  Ongoing diuretic and inotropic support recommended.  Pulse steroids started on 10/7 for concern about cardiac sarcoid, in addition to mycophenolate 500 mg p.o. twice daily.  Was also followed by electrophysiology who recommended ongoing amiodarone  and planned on possible ICD prior to discharge pending Outcome. On 10/10 renal function worse,  volume status worse.  Sodium continued to fall, and treated with 3% hypertonic saline prednisone  decreased to 30 mg daily, mycophenolate decreased for tremor which was new onset after starting, this was eventually discontinued later that afternoon. 10/11 still in shock.  Still requiring high dose diuresis, 10/13 nephrology consulted, dialysis catheter placed for CRRT.  Milrinone  switched to dobutamine 10/14 weight down 5 pounds with CRRT.  Patient was feeling a little better 10/15: Still on Lasix  drip.  Still on CRRT, CVP 16, requiring high dose vasopressors to maintain mean arterial pressure.  Unfortunately Colax remains 56% with Fick cardiac index at 1.42 10/16: Increased confusion, slight elevation of lactate up to 2.1 from normal.  Pressor requirements continuing to climb norepinephrine  up to 40 mics per minute unable to achieve goal UF because of hypotension.  By a.m. rounds lactate from 2.1-1.9.  Lasix  discontinued.  Very delirious.  Ammonia in the 50s. 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  Required defibrillation for ventricular tachycardia.  Placed on lidocaine  in addition to amiodarone .  Initially treated with milrinone  and Lasix  10/7 cardiac catheterization showing normal but slow flow in coronary arteries, severe biventricular heart failure with cardiac index of 1.7 milrinone  adjusted.  Given concern about cardiac sarcoid placed on steroid pulsed dosing 10/10 prednisone  be changed to 30 mg.  mycophelenate discontinued given new onset of tremors  10/13  ongoing   cardiogenic shock, hyponatremia requiring 3% saline, and worsening renal failure dialysis catheter placed and started on CRRT  10/13 through 10/16 in spite of inotropic support, and volume removal via CRRT had progressive fracture cardiogenic shock developed worsening delirium and confusion.  Broad spec abx coverage added in case of sepsis on 10/15 Critical care asked to evaluate Interval history/subjective     Worsening delirium overnight.  Worsening multiorgan failure.  Critical care asked to evaluate Objective    Blood pressure (!) 109/39, pulse 100, temperature 99.5 F (37.5 C), temperature source Axillary, resp. rate (!) 23, height 6' 5 (1.956 m), weight (!) 144 kg, SpO2 98%. CVP:  [12 mmHg-23 mmHg] 16 mmHg      Intake/Output Summary (Last 24 hours) at 12/24/2023 1027 Last data filed at 12/24/2023 1000 Gross per 24 hour  Intake 4464.85 ml  Output 5456.1 ml  Net -991.25 ml   Filed Weights   12/22/23 0500 12/23/23 0500 12/24/23 0500  Weight: (!) 147.1 kg (!) 146 kg (!) 144 kg    Examination: General: critically ill appearing male. 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 oximetry 58.2 on 7.5 mics per minute dobutamine.  Lactate had peaked at 2.9 earlier today now at 1.9.  Remains on high-dose norepinephrine  as well as high-dose vasopressin.  Added epinephrine Plan Continuing CRRT for volume removal Continue norepinephrine  and vasopressin for goal mean  arterial pressure greater than 65, continuing dobutamine at 7.5, added epinephrine for additional blood pressure/inotropic support. Spironolactone  and digoxin  held Continuing broad-spectrum antibiotics given ongoing shock state Continuing prednisone , and methotrexate for possible sarcoid Prophylactic dapsone DNR/DNI if arrests (I discussed this and confirmed with wife at bedside: see IPAL)  Atrial fibrillation with RVR.  Status post prior ablation and DC cardioversion. Currently followed by EP. Plan Continuing amiodarone  Continue telemetry  Ventricular tachycardia, status post DC cardioversion on 10/4 as well as 10/6 Plan Continue amiodarone  Mexiletine per EP  Acute on chronic renal failure w/ CKD stage IIIa Plan Ensure MAP > 65 Renal dose meds Strict I&O CRRT  Keep even   Acute metabolic and hepatic encephalopathy 2/2 on-going shock state, hepatic congestion w. Elevated ammonia and also renal failure Plan Cont supportive care Changed to rifaximin  Fluid and electrolyte imbalance: hyponatremia due to acute renal failure -stable at 129 Plan CRRT Trend and replace lytes as able   H/o OSA  Plan CPAP as able (currently not a candidate due to encephalopathy)   Labs   CBC: Recent Labs  Lab 12/20/23 0514 12/21/23 0454 12/21/23 1138 12/22/23 0432 12/23/23 0411 12/23/23 2041 12/24/23 0425  WBC 18.4* 15.2*  --  14.9* 18.4*  --  26.2*  HGB 15.9 15.0 17.7* 16.8 16.6 17.0 16.2  HCT 44.8 42.4 52.0 47.1 47.6 50.0 47.3  MCV 81.0 82.5  --  81.3 82.5  --  83.9  PLT 179 143*  --  140* 119*  --  116*    Basic Metabolic Panel: Recent Labs  Lab 12/20/23 0514 12/20/23 1415 12/21/23 0454 12/21/23 0810 12/22/23 0432 12/22/23 1613 12/23/23 0411 12/23/23 1600 12/23/23 2041 12/24/23 0425  NA 114*   < > 120*   < > 129* 129* 129* 129* 132* 129*  K 4.4   < > 3.8   < > 3.8 4.1 4.5 4.6 4.1 4.4  CL 87*   < > 88*   < > 99 100 99 99  --  101  CO2 15*   < > 17*   < > 18* 18* 18*  17*  --  18*  GLUCOSE 204*   < > 254*   < > 129* 102* 106* 204*  --  218*  BUN 83*   < > 78*   < > 50* 43* 36* 36*  --  35*  CREATININE 3.40*   < > 2.95*   < > 1.93* 1.86* 1.77* 1.92*  --  1.82*  CALCIUM  8.2*   < > 7.7*   < > 8.1* 8.0* 8.1* 8.0*  --  8.0*  MG 2.5*  --  2.6*  --  2.7*  --  2.5*  --   --  2.7*  PHOS  --   --   --    < > 3.8 3.5 2.6 3.2  --  2.8   < > = values in this interval not displayed.   GFR: Estimated Creatinine Clearance: 68.7 mL/min (A) (by C-G formula based on SCr of 1.82 mg/dL (H)). Recent Labs  Lab 12/21/23 0454 12/22/23 0432 12/23/23 0411 12/23/23 0814 12/23/23 2052 12/23/23 2256 12/24/23 0231 12/24/23 0424 12/24/23 0425 12/24/23 9192  PROCALCITON  --   --   --   --   --   --   --   --   --  0.22  WBC 15.2* 14.9* 18.4*  --   --   --   --   --  26.2*  --   LATICACIDVEN  --   --   --    < > 2.1* 1.8 1.9 1.9  --   --    < > = values in this interval not displayed.    Liver Function Tests: Recent Labs  Lab 12/19/23 1405 12/19/23 1447 12/21/23 0810 12/21/23 1542 12/22/23 1613 12/23/23 0411 12/23/23 0956 12/23/23 1600 12/24/23 0425  AST 36 34 38  --   --   --  47*  --   --   ALT 49* 52* 55*  --   --   --  55*  --   --   ALKPHOS 56 56 64  --   --   --  62  --   --   BILITOT 1.3* 1.2 2.2*  --   --   --  4.9*  --   --   PROT 5.7* 5.6* 5.7*  --   --   --  5.7*  --   --   ALBUMIN  2.9* 3.0* 3.1*   < > 3.0* 2.9* 3.1* 2.9* 3.0*   < > = values in this interval not displayed.   No results for input(s): LIPASE, AMYLASE in the last 168 hours. Recent Labs  Lab 12/23/23 0810 12/24/23 0425  AMMONIA 52* 51*    ABG    Component Value Date/Time   PHART 7.486 (H) 12/23/2023 2041   PCO2ART 25.0 (L) 12/23/2023 2041   PO2ART 95 12/23/2023 2041   HCO3 18.9 (L) 12/23/2023 2041   TCO2 20 (L) 12/23/2023 2041   ACIDBASEDEF 2.0 12/23/2023 2041  O2SAT 58.2 12/24/2023 0425     Coagulation Profile: No results for input(s): INR, PROTIME in the last  168 hours.  Cardiac Enzymes: No results for input(s): CKTOTAL, CKMB, CKMBINDEX, TROPONINI in the last 168 hours.  HbA1C: Hgb A1c MFr Bld  Date/Time Value Ref Range Status  12/14/2023 07:38 AM 5.5 4.8 - 5.6 % Final    Comment:    (NOTE) Diagnosis of Diabetes The following HbA1c ranges recommended by the American Diabetes Association (ADA) may be used as an aid in the diagnosis of diabetes mellitus.  Hemoglobin             Suggested A1C NGSP%              Diagnosis  <5.7                   Non Diabetic  5.7-6.4                Pre-Diabetic  >6.4                   Diabetic  <7.0                   Glycemic control for                       adults with diabetes.    05/11/2023 01:40 PM 5.7 4.6 - 6.5 % Final    Comment:    Glycemic Control Guidelines for People with Diabetes:Non Diabetic:  <6%Goal of Therapy: <7%Additional Action Suggested:  >8%     CBG: Recent Labs  Lab 12/23/23 1633 12/23/23 1925 12/23/23 2336 12/24/23 0421 12/24/23 0746  GLUCAP 233* 175* 195* 202* 207*    Review of Systems:   Not able   Past Medical History:  He,  has a past medical history of Chronic bronchitis, HTN (hypertension), NICM (nonischemic cardiomyopathy) (HCC), Obesity (BMI 30-39.9) (12/04/2015), OSA (obstructive sleep apnea) (08/19/2015), PAF (paroxysmal atrial fibrillation) (HCC), and Systolic CHF, chronic (HCC).   Surgical History:   Past Surgical History:  Procedure Laterality Date   APPENDECTOMY     ATRIAL FIBRILLATION ABLATION N/A 12/17/2022   Procedure: ATRIAL FIBRILLATION ABLATION;  Surgeon: Nancey [name], MD;  Location: [facility];  Service: Cardiovascular;  Laterality: N/A;   BRONCHIAL WASHINGS  04/24/2020   Procedure: BRONCHIAL WASHINGS;  Surgeon: [name], MD;  Location: [facility] ENDOSCOPY;  Service: Pulmonary;;   CARDIAC CATHETERIZATION     CARDIOVERSION N/A 06/19/2017   Procedure: CARDIOVERSION;  Surgeon: [name], MD;  Location: [facility]  ENDOSCOPY;  Service: Cardiovascular;  Laterality: N/A;   CARDIOVERSION N/A 10/02/2022   Procedure: CARDIOVERSION;  Surgeon: [name], MD;  Location: [facility];  Service: Cardiovascular;  Laterality: N/A;   CARDIOVERSION N/A 10/20/2022   Procedure: CARDIOVERSION;  Surgeon: [name], MD;  Location: [facility];  Service: Cardiovascular;  Laterality: N/A;   CARDIOVERSION N/A 10/23/2022   Procedure: CARDIOVERSION;  Surgeon: [name], MD;  Location: [facility];  Service: Cardiovascular;  Laterality: N/A;   CARDIOVERSION N/A 11/13/2022   Procedure: CARDIOVERSION;  Surgeon: [name], MD;  Location: [facility];  Service: Cardiovascular;  Laterality: N/A;   CARDIOVERSION N/A 12/10/2023   Procedure: CARDIOVERSION;  Surgeon: Michele Richardson, DO;  Location: [facility];  Service: Cardiovascular;  Laterality: N/A;   FINE NEEDLE ASPIRATION  04/24/2020   Procedure: FINE NEEDLE ASPIRATION (FNA)  LINEAR;  Surgeon: [name], MD;  Location: [facility] ENDOSCOPY;  Service: Pulmonary;;   LIPOMA RESECTION     arms   RIGHT HEART CATH N/A 10/30/2022   Procedure: RIGHT HEART CATH;  Surgeon: Gardenia Led, DO;  Location: [facility];  Service: Cardiovascular;  Laterality: N/A;   RIGHT HEART CATH AND CORONARY ANGIOGRAPHY N/A 12/15/2023   Procedure: RIGHT HEART CATH AND CORONARY ANGIOGRAPHY;  Surgeon: Zenaida Morene PARAS, MD;  Location: [facility];  Service: Cardiovascular;  Laterality: N/A;   SPLENECTOMY     SPLIT NIGHT STUDY  08/12/2015   VIDEO BRONCHOSCOPY WITH ENDOBRONCHIAL ULTRASOUND N/A 04/24/2020   Procedure: VIDEO BRONCHOSCOPY WITH ENDOBRONCHIAL ULTRASOUND;  Surgeon: [name], MD;  Location: [facility] ENDOSCOPY;  Service: Pulmonary;  Laterality: N/A;     Social History:   reports that he has never smoked. He has never used smokeless tobacco. He reports current alcohol use. He reports that he does not use drugs.   Family  History:  His family history includes Breast cancer in his mother; Cancer in his father; Heart disease in an other family member; Hypertension in an other family member. There is no history of Stomach cancer, Colon cancer, Esophageal cancer, Pancreatic cancer, or Rectal cancer.   Allergies Allergies  Allergen Reactions   Bee Venom Anaphylaxis     Home Medications  Prior to Admission medications   Medication Sig Start Date End Date Taking? Authorizing Provider  albuterol  (VENTOLIN  HFA) 108 (90 Base) MCG/ACT inhaler TAKE 2 PUFFS BY MOUTH EVERY 6 HOURS AS NEEDED FOR WHEEZE OR SHORTNESS OF BREATH 02/24/22  Yes [name], [name], MD  amiodarone  (PACERONE ) 200 MG tablet Take 2 tablets (400 mg total) by mouth 2 (two) times daily. 12/02/23  Yes [name], [name], MD  apixaban  (ELIQUIS ) 5 MG TABS tablet TAKE 1 TABLET BY MOUTH TWICE A DAY 07/23/23  Yes Bensimhon, [name], MD  digoxin  (LANOXIN ) 0.125 MG tablet Take 1 tablet (0.125 mg total) by mouth daily. 06/29/23  Yes Bensimhon, [name], MD  DULoxetine  (CYMBALTA ) 20 MG capsule TAKE 1 CAPSULE BY MOUTH EVERY DAY 12/02/22  Yes Bensimhon, [name], MD  empagliflozin  (JARDIANCE ) 10 MG TABS tablet Take 1 tablet (10 mg total) by mouth daily. 07/07/23  Yes [name], NP  Magnesium 250 MG TABS Take 250 mg by mouth every evening. Gummy   Yes [provider]  potassium chloride  SA (KLOR-CON  M) 20 MEQ tablet Take 2 tablets (40 mEq total) by mouth 2 (two) times daily. 12/03/22  Yes [name], [name], MD  spironolactone  (ALDACTONE ) 25 MG tablet Take 1 tablet (25 mg total) by mouth daily. 11/27/23  Yes Bensimhon, [name], MD  tamsulosin  (FLOMAX ) 0.4 MG CAPS capsule Take 1 capsule (0.4 mg total) by mouth daily after supper. 08/06/23  Yes Bensimhon, [name], MD  tirzepatide  (ZEPBOUND ) 12.5 MG/0.5ML Pen Inject 12.5 mg into the skin once a week. 10/28/23  Yes [name], MD  torsemide  (DEMADEX ) 20 MG tablet Take 2 tablets (40 mg total) by mouth 2 (two) times  daily. 08/05/23  Yes [name], [name], MD  losartan  (COZAAR ) 25 MG tablet Take 1 tablet (25 mg total) by mouth daily. Patient not taking: Reported on 12/12/2023 08/06/23   Bensimhon, [name], MD     Critical care time: 32 min

## 2023-12-24 NOTE — Plan of Care (Signed)
  Problem: Education: Goal: Knowledge of General Education information will improve Description: Including pain rating scale, medication(s)/side effects and non-pharmacologic comfort measures Outcome: Progressing   Problem: Health Behavior/Discharge Planning: Goal: Ability to manage health-related needs will improve Outcome: Progressing   Problem: Clinical Measurements: Goal: Ability to maintain clinical measurements within normal limits will improve Outcome: Progressing Goal: Will remain free from infection Outcome: Progressing Goal: Diagnostic test results will improve Outcome: Progressing Goal: Respiratory complications will improve Outcome: Progressing Goal: Cardiovascular complication will be avoided Outcome: Progressing   Problem: Activity: Goal: Risk for activity intolerance will decrease Outcome: Progressing   Problem: Nutrition: Goal: Adequate nutrition will be maintained Outcome: Progressing   Problem: Coping: Goal: Level of anxiety will decrease Outcome: Progressing   Problem: Elimination: Goal: Will not experience complications related to bowel motility Outcome: Progressing Goal: Will not experience complications related to urinary retention Outcome: Progressing   Problem: Pain Managment: Goal: General experience of comfort will improve and/or be controlled Outcome: Progressing   Problem: Safety: Goal: Ability to remain free from injury will improve Outcome: Progressing   Problem: Skin Integrity: Goal: Risk for impaired skin integrity will decrease Outcome: Progressing   Problem: Education: Goal: Ability to demonstrate management of disease process will improve Outcome: Progressing Goal: Ability to verbalize understanding of medication therapies will improve Outcome: Progressing   Problem: Activity: Goal: Capacity to carry out activities will improve Outcome: Progressing   Problem: Cardiac: Goal: Ability to achieve and maintain adequate  cardiopulmonary perfusion will improve Outcome: Progressing   Problem: Education: Goal: Ability to describe self-care measures that may prevent or decrease complications (Diabetes Survival Skills Education) will improve Outcome: Progressing   Problem: Coping: Goal: Ability to adjust to condition or change in health will improve Outcome: Progressing   Problem: Fluid Volume: Goal: Ability to maintain a balanced intake and output will improve Outcome: Progressing   Problem: Health Behavior/Discharge Planning: Goal: Ability to identify and utilize available resources and services will improve Outcome: Progressing Goal: Ability to manage health-related needs will improve Outcome: Progressing   Problem: Metabolic: Goal: Ability to maintain appropriate glucose levels will improve Outcome: Progressing   Problem: Nutritional: Goal: Maintenance of adequate nutrition will improve Outcome: Progressing Goal: Progress toward achieving an optimal weight will improve Outcome: Progressing   Problem: Skin Integrity: Goal: Risk for impaired skin integrity will decrease Outcome: Progressing   Problem: Tissue Perfusion: Goal: Adequacy of tissue perfusion will improve Outcome: Progressing   Problem: Education: Goal: Understanding of CV disease, CV risk reduction, and recovery process will improve Outcome: Progressing   Problem: Activity: Goal: Ability to return to baseline activity level will improve Outcome: Progressing   Problem: Cardiovascular: Goal: Ability to achieve and maintain adequate cardiovascular perfusion will improve Outcome: Progressing Goal: Vascular access site(s) Level 0-1 will be maintained Outcome: Progressing   Problem: Health Behavior/Discharge Planning: Goal: Ability to safely manage health-related needs after discharge will improve Outcome: Progressing

## 2023-12-24 NOTE — Progress Notes (Signed)
 Speech Language Pathology Treatment: Dysphagia  Patient Details Name: Harold Park MRN: [id number] DOB: Dec 16, 1965 Today's Date: 12/24/2023 Time: 9087-9075 SLP Time Calculation (min) (ACUTE ONLY): 12 min  Assessment / Plan / Recommendation Clinical Impression  Harold Park was lethargic but rousable.  His wife is at the bedside.  Oral care provided. Accepted ice chips with adequate recognition and effort.  He is protecting his airway when alert and with HOB elevated.  Recommend frequent oral care, offer ice chips and provide sips of liquid when he is sufficiently alert.  SLP will follow.   HPI HPI: 58 y.o. male admitted 10/3 with SOB, weight gain. Found to have decompensated HF / cardiogenic shock, AFwRVR / AFL s/p DCCV 12/10/23. CRRT started 10/13. Pt had VT with DCCV on 10/4. Possible cardiac sarcoidosis. PMH: HFrEF, PAF, OSA, VT, CKD Stage IIIa.      SLP Plan  Continue with current plan of care          Recommendations  Diet recommendations: Thin liquid Liquids provided via: Straw Medication Administration: Via alternative means Supervision: Trained caregiver to feed patient Compensations: Minimize environmental distractions                  Oral care BID     Dysphagia, unspecified (R13.10)     Continue with current plan of care   Harold Cottone L. Vona, MA CCC/SLP Clinical Specialist - Acute Care SLP Acute Rehabilitation Services Office number [phone]   Harold Park  12/24/2023, 9:29 AM

## 2023-12-24 NOTE — Plan of Care (Signed)
  Problem: Education: Goal: Knowledge of General Education information will improve Description: Including pain rating scale, medication(s)/side effects and non-pharmacologic comfort measures Outcome: Not Progressing   Problem: Health Behavior/Discharge Planning: Goal: Ability to manage health-related needs will improve Outcome: Not Progressing   Problem: Clinical Measurements: Goal: Ability to maintain clinical measurements within normal limits will improve Outcome: Not Progressing Goal: Will remain free from infection Outcome: Not Progressing Goal: Diagnostic test results will improve Outcome: Not Progressing Goal: Respiratory complications will improve Outcome: Not Progressing Goal: Cardiovascular complication will be avoided Outcome: Not Progressing   Problem: Activity: Goal: Risk for activity intolerance will decrease Outcome: Not Progressing   Problem: Nutrition: Goal: Adequate nutrition will be maintained Outcome: Not Progressing   Problem: Coping: Goal: Level of anxiety will decrease Outcome: Not Progressing   Problem: Elimination: Goal: Will not experience complications related to bowel motility Outcome: Not Progressing Goal: Will not experience complications related to urinary retention Outcome: Not Progressing   Problem: Pain Managment: Goal: General experience of comfort will improve and/or be controlled Outcome: Not Progressing   Problem: Safety: Goal: Ability to remain free from injury will improve Outcome: Not Progressing   Problem: Skin Integrity: Goal: Risk for impaired skin integrity will decrease Outcome: Not Progressing   Problem: Education: Goal: Ability to demonstrate management of disease process will improve Outcome: Not Progressing Goal: Ability to verbalize understanding of medication therapies will improve Outcome: Not Progressing   Problem: Activity: Goal: Capacity to carry out activities will improve Outcome: Not Progressing    Problem: Cardiac: Goal: Ability to achieve and maintain adequate cardiopulmonary perfusion will improve Outcome: Not Progressing   Problem: Education: Goal: Ability to describe self-care measures that may prevent or decrease complications (Diabetes Survival Skills Education) will improve Outcome: Not Progressing   Problem: Coping: Goal: Ability to adjust to condition or change in health will improve Outcome: Not Progressing   Problem: Fluid Volume: Goal: Ability to maintain a balanced intake and output will improve Outcome: Not Progressing   Problem: Health Behavior/Discharge Planning: Goal: Ability to identify and utilize available resources and services will improve Outcome: Not Progressing Goal: Ability to manage health-related needs will improve Outcome: Not Progressing   Problem: Metabolic: Goal: Ability to maintain appropriate glucose levels will improve Outcome: Not Progressing   Problem: Nutritional: Goal: Maintenance of adequate nutrition will improve Outcome: Not Progressing Goal: Progress toward achieving an optimal weight will improve Outcome: Not Progressing   Problem: Skin Integrity: Goal: Risk for impaired skin integrity will decrease Outcome: Not Progressing   Problem: Tissue Perfusion: Goal: Adequacy of tissue perfusion will improve Outcome: Not Progressing   Problem: Education: Goal: Understanding of CV disease, CV risk reduction, and recovery process will improve Outcome: Not Progressing   Problem: Activity: Goal: Ability to return to baseline activity level will improve Outcome: Not Progressing   Problem: Cardiovascular: Goal: Ability to achieve and maintain adequate cardiovascular perfusion will improve Outcome: Not Progressing Goal: Vascular access site(s) Level 0-1 will be maintained Outcome: Not Progressing   Problem: Health Behavior/Discharge Planning: Goal: Ability to safely manage health-related needs after discharge will  improve Outcome: Not Progressing

## 2023-12-24 NOTE — Progress Notes (Signed)
 Physical Therapy Treatment Patient Details Name: Harold Park MRN: [id number] DOB: 06-Jul-1965 Today's Date: 12/24/2023   History of Present Illness 58 y.o. male admitted 10/3 with SOB, weight gain. Found to have decompensated HF / cardiogenic shock, AFwRVR / AFL s/p DCCV 12/10/23. CRRT started 10/13. Pt had VT with DCCV on 10/4. Possible cardiac sarcoidosis. PMH: HFrEF, PAF, OSA, VT, CKD Stage IIIa.    PT Comments  Tolerated standing a little better today. Still sits upright unsupported with close CGA and line management while bed in egress position. Mod assist to rise and balance, with posterior lean +2 for safety and line management; difficulty shifting weight anteriorly at first but gradually improved. Reviewed LE exercises but needs active assist to complete. Frequent engagement needed to remain alert but following commands >50% of the time with multimodal cues.  MAP appeared to range 67-70s with activity but did not zero between transitional positions. He did deny dizziness this visit which was limiting pt last visit. Patient will continue to benefit from skilled physical therapy services to further improve independence with functional mobility.    If plan is discharge home, recommend the following: Two people to help with walking and/or transfers;Two people to help with bathing/dressing/bathroom;Assistance with cooking/housework;Direct supervision/assist for medications management;Direct supervision/assist for financial management;Assist for transportation;Help with stairs or ramp for entrance;Supervision due to cognitive status   Can travel by private vehicle        Equipment Recommendations  Other (comment) (TBD - limited assessment)    Recommendations for Other Services Rehab consult     Precautions / Restrictions Precautions Precautions: Fall Recall of Precautions/Restrictions: Impaired Restrictions Weight Bearing Restrictions Per Provider Order: No     Mobility  Bed  Mobility Overal bed mobility: Needs Assistance             General bed mobility comments: Due to lethargy egress mode on bed. Patient tolerated well, able to progress with unsupported sitting with CGA. Intermittent cues for engagement and leaning back. Pulls through therapist's hands to rise. Tolerated majority of visit in this position again. Denied dizziness.    Transfers Overall transfer level: Needs assistance Equipment used: Rolling walker (2 wheels) Transfers: Sit to/from Stand Sit to Stand: Mod assist, +2 physical assistance, +2 safety/equipment, From elevated surface           General transfer comment: Mod assist +2 for for boost and equipment management to stand from egress position slightly elevated due to height. Tolerated approx 1 minute in standing, denies dizziness this time but felt very weak. MAP appeared lowest at 67 but A-line not zeroed between transitions. Pt with moderate posterior lean. Having a difficult time pushing down through RW but did improve with cues for anterior weight shift over therapist's shoulder with forward facing guard and light knee block for safety.    Ambulation/Gait                   Stairs             Wheelchair Mobility     Tilt Bed    Modified Rankin (Stroke Patients Only)       Balance Overall balance assessment: Needs assistance Sitting-balance support: No upper extremity supported, Feet supported Sitting balance-Leahy Scale: Fair Sitting balance - Comments: close CGA due to lethargy.   Standing balance support: Bilateral upper extremity supported Standing balance-Leahy Scale: Poor Standing balance comment: posterior instability  Communication Communication Communication: Impaired Factors Affecting Communication: Difficulty expressing self;Reduced clarity of speech  Cognition Arousal: Lethargic Behavior During Therapy: Flat affect   PT - Cognitive impairments: No  family/caregiver present to determine baseline, Attention, Initiation, Sequencing, Problem solving, Difficult to assess, Safety/Judgement Difficult to assess due to: Level of arousal                     PT - Cognition Comments: Limited by level of arousal but was answering simple questions intermittently Following commands: Impaired Following commands impaired: Follows one step commands with increased time, Follows one step commands inconsistently    Cueing Cueing Techniques: Verbal cues, Gestural cues, Tactile cues, Visual cues  Exercises General Exercises - Lower Extremity Ankle Circles/Pumps: AAROM, Both, 20 reps, Seated Quad Sets: Strengthening, Both, 5 reps, Supine Short Arc Quad: Strengthening, AAROM, Both, 10 reps, Seated    General Comments General comments (skin integrity, edema, etc.): MAP 67-70s with activity although a-line not zeroed between transitions. SpO2 97% on [place], HR 100.      Pertinent Vitals/Pain Pain Assessment Pain Assessment: CPOT Facial Expression: Relaxed, neutral Body Movements: Protection Muscle Tension: Relaxed Compliance with ventilator (intubated pts.): N/A Vocalization (extubated pts.): Talking in normal tone or no sound CPOT Total: 1 Pain Intervention(s): Monitored during session    Home Living                          Prior Function            PT Goals (current goals can now be found in the care plan section) Acute Rehab PT Goals Patient Stated Goal: none stated PT Goal Formulation: Patient unable to participate in goal setting Time For Goal Achievement: 01/05/24 Potential to Achieve Goals: Fair Progress towards PT goals: Progressing toward goals    Frequency    Min 2X/week      PT Plan      Co-evaluation              AM-PAC PT 6 Clicks Mobility   Outcome Measure  Help needed turning from your back to your side while in a flat bed without using bedrails?: A Little Help needed moving from lying on  your back to sitting on the side of a flat bed without using bedrails?: A Lot Help needed moving to and from a bed to a chair (including a wheelchair)?: A Lot Help needed standing up from a chair using your arms (e.g., wheelchair or bedside chair)?: A Lot Help needed to walk in hospital room?: Total Help needed climbing 3-5 steps with a railing? : Total 6 Click Score: 11    End of Session Equipment Utilized During Treatment: Oxygen Activity Tolerance: Patient limited by lethargy;Patient limited by fatigue Patient left: in bed;with call bell/phone within reach;with nursing/sitter in room Nurse Communication: Mobility status;Need for lift equipment PT Visit Diagnosis: Unsteadiness on feet (R26.81);Other abnormalities of gait and mobility (R26.89);Muscle weakness (generalized) (M62.81);Difficulty in walking, not elsewhere classified (R26.2);Other symptoms and signs involving the nervous system (R29.898)     Time: 8640-8575 PT Time Calculation (min) (ACUTE ONLY): 25 min  Charges:    $Therapeutic Activity: 23-37 mins PT General Charges $$ ACUTE PT VISIT: 1 Visit                     Leontine Roads, PT, DPT [facility]  Rehabilitation Services Physical Therapist Office: [phone] Website: [email or web address]    Leontine GORMAN Roads 12/24/2023, 3:37 PM

## 2023-12-24 NOTE — IPAL (Signed)
  Interdisciplinary Goals of Care Family Meeting   Date carried out: 12/24/2023  Location of the meeting: Bedside  Member's involved: Nurse Practitioner, Bedside Registered Nurse, and Family Member or next of kin  Durable Power of Attorney or acting medical decision maker: wife Harold Park    Discussion: We discussed goals of care for [name] .  We discussed current level of care.end-stage CM w/ refractory shock, no further options such as mechanical bridging support for transplant. [name] had shared in past he would not want CPR of prolonged vent support.  Confirmed and agreed to DNR continue current measures No CPR/No intubation If arrests only comfort measures   Code status:   Code Status: Do not attempt resuscitation (DNR) - Comfort care   Disposition: Continue current acute care  Time spent for the meeting: 23     Jeralyn FORBES Banner, NP  12/24/2023, 11:32 AM

## 2023-12-25 DIAGNOSIS — I5082 Biventricular heart failure: Secondary | ICD-10-CM | POA: Diagnosis not present

## 2023-12-25 DIAGNOSIS — I4891 Unspecified atrial fibrillation: Secondary | ICD-10-CM

## 2023-12-25 DIAGNOSIS — N179 Acute kidney failure, unspecified: Secondary | ICD-10-CM | POA: Diagnosis not present

## 2023-12-25 DIAGNOSIS — R570 Cardiogenic shock: Secondary | ICD-10-CM | POA: Diagnosis not present

## 2023-12-25 DIAGNOSIS — I5023 Acute on chronic systolic (congestive) heart failure: Secondary | ICD-10-CM | POA: Diagnosis not present

## 2023-12-25 DIAGNOSIS — G9341 Metabolic encephalopathy: Secondary | ICD-10-CM

## 2023-12-25 LAB — CBC
HCT: 49 % (ref 39.0–52.0)
Hemoglobin: 16.7 g/dL (ref 13.0–17.0)
MCH: 28.6 pg (ref 26.0–34.0)
MCHC: 34.1 g/dL (ref 30.0–36.0)
MCV: 84 fL (ref 80.0–100.0)
Platelets: 118 K/uL — ABNORMAL LOW (ref 150–400)
RBC: 5.83 MIL/uL — ABNORMAL HIGH (ref 4.22–5.81)
RDW: 17.2 % — ABNORMAL HIGH (ref 11.5–15.5)
WBC: 27.1 K/uL — ABNORMAL HIGH (ref 4.0–10.5)
nRBC: 7.8 % — ABNORMAL HIGH (ref 0.0–0.2)

## 2023-12-25 LAB — RENAL FUNCTION PANEL
Albumin: 2.8 g/dL — ABNORMAL LOW (ref 3.5–5.0)
Albumin: 2.9 g/dL — ABNORMAL LOW (ref 3.5–5.0)
Anion gap: 10 (ref 5–15)
Anion gap: 12 (ref 5–15)
BUN: 38 mg/dL — ABNORMAL HIGH (ref 6–20)
BUN: 39 mg/dL — ABNORMAL HIGH (ref 6–20)
CO2: 18 mmol/L — ABNORMAL LOW (ref 22–32)
CO2: 18 mmol/L — ABNORMAL LOW (ref 22–32)
Calcium: 8 mg/dL — ABNORMAL LOW (ref 8.9–10.3)
Calcium: 7.9 mg/dL — ABNORMAL LOW (ref 8.9–10.3)
Chloride: 102 mmol/L (ref 98–111)
Chloride: 102 mmol/L (ref 98–111)
Creatinine, Ser: 1.83 mg/dL — ABNORMAL HIGH (ref 0.61–1.24)
Creatinine, Ser: 1.91 mg/dL — ABNORMAL HIGH (ref 0.61–1.24)
GFR, Estimated: 42 mL/min — ABNORMAL LOW (ref >60)
GFR, Estimated: 40 mL/min — ABNORMAL LOW (ref >60)
Glucose, Bld: 250 mg/dL — ABNORMAL HIGH (ref 70–99)
Glucose, Bld: 197 mg/dL — ABNORMAL HIGH (ref 70–99)
Phosphorus: 2.7 mg/dL (ref 2.5–4.6)
Phosphorus: 2.7 mg/dL (ref 2.5–4.6)
Potassium: 4.3 mmol/L (ref 3.5–5.1)
Potassium: 4.5 mmol/L (ref 3.5–5.1)
Sodium: 130 mmol/L — ABNORMAL LOW (ref 135–145)
Sodium: 132 mmol/L — ABNORMAL LOW (ref 135–145)

## 2023-12-25 LAB — GLUCOSE, CAPILLARY
Glucose-Capillary: 234 mg/dL — ABNORMAL HIGH (ref 70–99)
Glucose-Capillary: 229 mg/dL — ABNORMAL HIGH (ref 70–99)
Glucose-Capillary: 210 mg/dL — ABNORMAL HIGH (ref 70–99)
Glucose-Capillary: 157 mg/dL — ABNORMAL HIGH (ref 70–99)
Glucose-Capillary: 134 mg/dL — ABNORMAL HIGH (ref 70–99)
Glucose-Capillary: 117 mg/dL — ABNORMAL HIGH (ref 70–99)
Glucose-Capillary: 105 mg/dL — ABNORMAL HIGH (ref 70–99)

## 2023-12-25 LAB — COOXEMETRY PANEL
Carboxyhemoglobin: 0.7 % (ref 0.5–1.5)
Carboxyhemoglobin: 2.2 % — ABNORMAL HIGH (ref 0.5–1.5)
Methemoglobin: 1.1 % (ref 0.0–1.5)
Methemoglobin: 1.4 % (ref 0.0–1.5)
O2 Saturation: 43.3 %
O2 Saturation: 51.1 %
Total hemoglobin: 17.1 g/dL — ABNORMAL HIGH (ref 12.0–16.0)
Total hemoglobin: 14.6 g/dL (ref 12.0–16.0)

## 2023-12-25 LAB — AMMONIA: Ammonia: 68 umol/L — ABNORMAL HIGH (ref 9–35)

## 2023-12-25 LAB — HEPARIN LEVEL (UNFRACTIONATED): Heparin Unfractionated: 0.3 [IU]/mL (ref 0.30–0.70)

## 2023-12-25 LAB — MAGNESIUM: Magnesium: 2.6 mg/dL — ABNORMAL HIGH (ref 1.7–2.4)

## 2023-12-25 LAB — LACTIC ACID, PLASMA: Lactic Acid, Venous: 2 mmol/L (ref 0.5–1.9)

## 2023-12-25 MED ORDER — INSULIN ASPART 100 UNIT/ML IJ SOLN
0.0000 [IU] | INTRAMUSCULAR | Status: DC
  Administered 2023-12-25 (×2): 7 [IU] via SUBCUTANEOUS
  Administered 2023-12-25: 3 [IU] via SUBCUTANEOUS
  Administered 2023-12-25 – 2023-12-26 (×3): 4 [IU] via SUBCUTANEOUS
  Administered 2023-12-26: 3 [IU] via SUBCUTANEOUS
  Administered 2023-12-27: 4 [IU] via SUBCUTANEOUS
  Administered 2023-12-27 (×2): 3 [IU] via SUBCUTANEOUS
  Administered 2023-12-27 (×2): 4 [IU] via SUBCUTANEOUS
  Administered 2023-12-27 – 2023-12-28 (×6): 3 [IU] via SUBCUTANEOUS
  Administered 2023-12-29 (×4): 4 [IU] via SUBCUTANEOUS

## 2023-12-25 MED ORDER — VITAL 1.5 CAL PO LIQD
1000.0000 mL | ORAL | Status: DC
  Administered 2023-12-25 – 2023-12-28 (×4): 1000 mL
  Filled 2023-12-25: qty 1000

## 2023-12-25 MED ORDER — PROSOURCE TF20 ENFIT COMPATIBL EN LIQD
60.0000 mL | ENTERAL | Status: DC
  Administered 2023-12-25 – 2023-12-29 (×24): 60 mL
  Filled 2023-12-25 (×24): qty 60

## 2023-12-25 MED ORDER — LACTULOSE 10 GM/15ML PO SOLN
10.0000 g | Freq: Three times a day (TID) | ORAL | Status: DC
  Administered 2023-12-25 (×3): 10 g
  Filled 2023-12-25 (×3): qty 15

## 2023-12-25 MED ORDER — INSULIN ASPART 100 UNIT/ML IJ SOLN
5.0000 [IU] | INTRAMUSCULAR | Status: DC
  Administered 2023-12-25 – 2023-12-26 (×5): 5 [IU] via SUBCUTANEOUS

## 2023-12-25 NOTE — Progress Notes (Signed)
 PHARMACY - ANTICOAGULATION CONSULT NOTE  Pharmacy Consult for heparin  Indication: atrial fibrillation  Allergies  Allergen Reactions   Bee Venom Anaphylaxis    Patient Measurements: Height: 6' 5 (195.6 cm) Weight: 134.9 kg (297 lb 6.4 oz) IBW/kg (Calculated) : 89.1 HEPARIN  DW (KG): 119  Vital Signs: Temp: 97.8 F (36.6 C) (10/17 0723) Temp Source: Axillary (10/17 0723) Pulse Rate: 101 (10/17 0737)  Labs: Recent Labs    12/23/23 0411 12/23/23 1600 12/23/23 2041 12/24/23 0425 12/24/23 1552 12/25/23 0449  HGB 16.6  --  17.0 16.2  --  16.7  HCT 47.6  --  50.0 47.3  --  49.0  PLT 119*  --   --  116*  --  118*  HEPARINUNFRC 0.41  --   --  0.41  --  0.30  CREATININE 1.77*   < >  --  1.82* 1.80* 1.83*   < > = values in this interval not displayed.    Estimated Creatinine Clearance: 66 mL/min (A) (by C-G formula based on SCr of 1.83 mg/dL (H)).   Medications:   Infusions:   sodium chloride  20 mL/hr at 12/25/23 0700   amiodarone  30 mg/hr (12/25/23 0700)   DOBUTamine 7.5 mcg/kg/min (12/25/23 0700)   epinephrine 3 mcg/min (12/25/23 0700)   feeding supplement (VITAL 1.5 CAL) 60 mL/hr at 12/25/23 0700   heparin  1,450 Units/hr (12/25/23 0700)   norepinephrine  (LEVOPHED ) Adult infusion 48 mcg/min (12/25/23 0700)   piperacillin-tazobactam Stopped (12/25/23 0402)   prismasol BGK 4/2.5 400 mL/hr at 12/25/23 0530   prismasol BGK 4/2.5 400 mL/hr at 12/25/23 0530   prismasol BGK 4/2.5 1,500 mL/hr at 12/25/23 0538   vancomycin Stopped (12/24/23 1227)   vasopressin 0.04 Units/min (12/25/23 0700)    Assessment: 58 YOM admitted with atrial fibrillation with rapid ventricular response. Patient underwent DCCV on 12/10/23 prior to this admission. Patient was shocked on 12/12/23 PM, now in NSR. Patient on Eliquis  prior to hospitalization, last dose on 12/11/23. Plan to start heparin  infusion prior to possible procedures. Pharmacy consulted for heparin  dosing.   Heparin  level is  therapeutic at 0.3, on heparin  1450 units/hr. No issues with infusion running or signs of bleeding per RN. CBC stable (Hgb 16.7, PLT 118).   Goal of Therapy:  Heparin  level 0.3-0.7 units/ml Monitor platelets by anticoagulation protocol: Yes   Plan:  Increase heparin  infusion to 1500 units/hr to keep in goal range - will just check with AM labs Monitor heparin  level, CBC, and s/sx of bleeding daily  Thank you for allowing pharmacy to participate in this patient's care,  Suzen Sour, PharmD, BCCCP Clinical Pharmacist  Phone: [phone] 12/25/2023 8:05 AM  Please check AMION for all [facility] Pharmacy phone numbers After 10:00 PM, call Main Pharmacy [phone]

## 2023-12-25 NOTE — Progress Notes (Addendum)
 NAME:  Harold Park, MRN:  [id number], DOB:  [birth year], LOS: 13 ADMISSION DATE:  12/11/2023, CONSULTATION DATE:  10/16 REFERRING MD:  Vergil, CHIEF COMPLAINT:  multiple organ failure and shock    History of Present Illness:  58 year old male patient with history of heart failure with reduced ejection fraction (EF 35%), chronic atrial fibrillation/flutter status post previous attempts at cardioversion and ablation, initially admitted on 10/3 after being treated in the outpatient setting for ongoing atrial flutter and VT which she was cardioverted on 10/2, in the days following developed progressive increased shortness of breath, orthopnea, paroxysmal nocturnal dyspnea, intermittent nausea, and 10 pound weight gain.  He had not responded to diuretics in the outpatient setting, had mild elevated lactate at 2.1, and new transthoracic echocardiogram showing EF of 10% with biatrial dilation and severely dilated LV.  He was admitted to the advanced heart failure service, placed on Lasix  and milrinone  infusion admitted to the intensive care.    10/6 he underwent left and right heart cath.  His right atrial pressure was 20 mmHg, RV 50/14 PA 50/32 with mean 38, pulmonary capillary wedge pressure of 32 mmHg, estimated Fick was 5.95 L/min with cardiac index at 2.24, thermodilution readings: Cardiac output 4.5 with index of 1.7.  Angiography showed sluggish coronary flow felt secondary to catheter size and severely elevated LVEDP but showed no arteriosclerotic disease he has severely elevated biventricular filling pressures with severely reduced papi score of less than 1.  Ongoing diuretic and inotropic support recommended.  Pulse steroids started on 10/7 for concern about cardiac sarcoid, in addition to mycophenolate 500 mg p.o. twice daily.  Was also followed by electrophysiology who recommended ongoing amiodarone  and planned on possible ICD prior to discharge pending Outcome. On 10/10 renal function worse,  volume status worse.  Sodium continued to fall, and treated with 3% hypertonic saline prednisone  decreased to 30 mg daily, mycophenolate decreased for tremor which was new onset after starting, this was eventually discontinued later that afternoon. 10/11 still in shock.  Still requiring high dose diuresis, 10/13 nephrology consulted, dialysis catheter placed for CRRT.  Milrinone  switched to dobutamine 10/14 weight down 5 pounds with CRRT.  Patient was feeling a little better 10/15: Still on Lasix  drip.  Still on CRRT, CVP 16, requiring high dose vasopressors to maintain mean arterial pressure.  Unfortunately Col[place] remains 56% with Fick cardiac index at 1.42 10/16: Increased confusion, slight elevation of lactate up to 2.1 from normal.  Pressor requirements continuing to climb norepinephrine  up to 40 mics per minute unable to achieve goal UF because of hypotension.  By a.m. rounds lactate from 2.1-1.9.  Lasix  discontinued.  Very delirious.  Ammonia in the 50s. 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  Required defibrillation for ventricular tachycardia.  Placed on lidocaine  in addition to amiodarone .  Initially treated with milrinone  and Lasix  10/7 cardiac catheterization showing normal but slow flow in coronary arteries, severe biventricular heart failure with cardiac index of 1.7 milrinone  adjusted.  Given concern about cardiac sarcoid placed on steroid pulsed dosing 10/10 prednisone  be changed to 30 mg.  mycophelenate discontinued given new onset of tremors  10/13  ongoing   cardiogenic shock, hyponatremia requiring 3% saline, and worsening renal failure dialysis catheter placed and started on CRRT  10/13 through 10/16 in spite of inotropic support, and volume removal via CRRT had progressive fracture cardiogenic shock developed worsening delirium and confusion.  Broad spec abx coverage added in case of sepsis on 10/15 Critical care asked to evaluate 10/16 DNR and DNI status  Interval history/subjective    Requiring escalating epinephrine.  Still not able to pull much fluid off.  Heart failure team is escalated epinephrine dosing to 6 mics per minute in effort to try to get more volume off Objective    Blood pressure (!) 109/39, pulse 100, temperature 97.8 F (36.6 C), temperature source Axillary, resp. rate (!) 21, height 6' 5 (1.956 m), weight 134.9 kg, SpO2 92%. CVP:  [13 mmHg-22 mmHg] 16 mmHg      Intake/Output Summary (Last 24 hours) at 12/25/2023 0729 Last data filed at 12/25/2023 0700 Gross per 24 hour  Intake 4916.27 ml  Output 5576 ml  Net -659.73 ml   Filed Weights   12/23/23 0500 12/24/23 0500 12/25/23 0500  Weight: (!) 146 kg (!) 144 kg 134.9 kg    Examination: General 58 year old male patient lying in bed not in acute distress, he is slow to respond, speech is slurred, he remains encephalopathic HEENT normal cephalic atraumatic no obvious jugular venous distention is appreciated he has a right IJ triple-lumen dialysis catheter the dressing is intact Pulmonary diminished bilaterally shallow rapid breathing pattern currently no accessory use, currently oxygen needs At 2 L/min Cardiac regular rate and rhythm, sinus tach with first-degree heart block on telemetry Extremities diffuse anasarca cool, mottled, pulses audible via Doppler, increasing purple discoloration in the tips of the both feet Abdomen soft not tender tolerating tube feeds GU minimal urine output Neuro opens eyes to voice, will follow commands.  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  Current pressor/inotrope needs: DBA 7.25mcg/min; Epi 6mcg/min; NE 48 mcg/min/VPA 0.04 u/min Not really any thing else to add here. Think with pt not being candidate for destination we have maxed out what we can offer. GOC  discussion yesterday. Cont current rx. DNR/DNI Plan Plan Cont NE titration for MAP > 60 Cont fixed dose Epi/VPA and DPA; had most recently inc'd epi to 3mcg/min. Feel like further titration is futile t. Spironolactone  and digoxin  held Continuing broad-spectrum antibiotics given ongoing shock state Continuing prednisone , and methotrexate for possible sarcoid (will d/w team. If we are also covering for infxn not sure continuing methotrexate makes sense Cont dapsone for prophylaxis for PJP   DNR/DNI if arrests (I discussed this and confirmed with wife at bedside: see IPAL)  Atrial fibrillation with RVR.  Status post prior ablation and DC cardioversion. Currently followed by EP. Current rate 100 Plan Continuing amiodarone  Heparin  gtt K>4/Mg > 2 Continue telemetry  Ventricular tachycardia, status post DC cardioversion on 10/4 as well as 10/6 Incidence of VT seems to have stabilized; EP and cards believe more so since having added the prednisone  for cardiac sarcoid  Plan Continue tele and amiodarone  Mexiletine per EP   Acute on chronic renal failure w/ CKD stage IIIa Currently -7.6 liters. But  really only down about 4 lbs since admit Plan Attempt to pull volume as able w/ CRRT as long as we can cont MAP > 65 Serial chems CRRT per nephro  Acute metabolic and hepatic encephalopathy 2/2 on-going shock state, hepatic congestion w.  Elevated ammonia and also renal failure Plan Added HS melatonin to facilitate sleep/wake Cont to encourage activity during day: lights on, sitting up. PT activity and then rest at HS Cont Rifaximin Supportive medical care (CRRT, maximize CO), cont to address water balance and hyponatremia via CRRT  Fluid and electrolyte imbalance: hyponatremia due to acute renal failure -stable on CRRT. 129-->130 Plan CRRT Cont to trend lytes   Hyperglycemia, glucose >200s Plan Cont q4hr ssi; will inc to resistant scale Cont current TF coverage at 5 units q 4 No change in basal at 20 units qd   H/o OSA  Plan Given delirium and asp risk will hold off on CPAP     Critical care time: I personally  spent 46 minutes  on this patient which included: review of medical records, nursing notes, progress notes, evaluation, interpretation of lab data and diagnostic studies, taking independent history, performing exam, documenting plan, ordering diagnostics and interventions for the following critical care issues: Circulatory shock, Acute toxic and or metabolic encephalopathy, Severe metabolic derangements with the following interventions which included: titration of hemodynamic drips to desired MAP, evaluation of complex metabolic derangements and ordering appropriate interventions and or consultations , evaluation and management of acute neurological insult , End of life discussion

## 2023-12-25 NOTE — Progress Notes (Signed)
 MAP goal 60 or greater per Dr. Rolan.

## 2023-12-25 NOTE — Progress Notes (Addendum)
 Advanced Heart Failure Rounding Note  Cardiologist: Wilbert Bihari, MD  Chief Complaint: A/C HF Subjective:   10/6 Started on Milirnone 0.125 for low output.  Developed VTwith shock x1. Sleeping at the time but described some chest burning. Lidocaine  added. K supplemented.  10/7 Cath - coronaries ok. Elevated filling pressures, CI 1.7. Milrinone  increased to 0.25 mcg. Started on pulse steroids for possible sarcoid 10/13: Milrinone  switched to DBA  Continues on high pressor/inotropic support, on DBA 7.5, Epi 3, NE 48 + VP 0.04. Co-ox 43%, MAPs low 60s   On Vanc and Zosyn, WBC 26>>27K. BCx remain negative.    Remains oliguric. Continues on CRRT, only able to tolerate 50 cc/hr fluid removal d/t hypotension and increased pressor requirements. CVP 20   Scr stable at 1.8   Rhythm stable. No VT   Remains lethargic and confused.    Objective:   Weight Range: 134.9 kg Body mass index is 35.27 kg/m.   Vital Signs:   Temp:  [97.5 F (36.4 C)-98 F (36.7 C)] 97.8 F (36.6 C) (10/17 0723) Pulse Rate:  [94-104] 100 (10/17 0700) Resp:  [13-33] 21 (10/17 0700) SpO2:  [89 %-100 %] 92 % (10/17 0700) Arterial Line BP: (74-99)/(49-67) 97/54 (10/17 0700) Weight:  [134.9 kg] 134.9 kg (10/17 0500) Last BM Date : 12/23/23  Weight change: Filed Weights   12/23/23 0500 12/24/23 0500 12/25/23 0500  Weight: (!) 146 kg (!) 144 kg 134.9 kg    Intake/Output:   Intake/Output Summary (Last 24 hours) at 12/25/2023 0735 Last data filed at 12/25/2023 0700 Gross per 24 hour  Intake 4916.27 ml  Output 5576 ml  Net -659.73 ml      Physical Exam   CVP 20 GENERAL: critically ill, lethargic, no respiratory distress  EENT: + cor track  NECK: + RIJ HD cath  Lungs- diminished at the bases  CARDIAC:  JVP elevated to jaw         Normal rhythm, tachy rate. No MRG.  ABDOMEN: Soft, non-tender, non-distended.  EXTREMITIES: 1+ b/l LEE, cool distal ext, dusty toes + RUE PICC  NEUROLOGIC: lethargic  and confused    Telemetry   ST low 100s, no VT  (Personally reviewed)    Labs    CBC Recent Labs    12/24/23 0425 12/25/23 0449  WBC 26.2* 27.1*  HGB 16.2 16.7  HCT 47.3 49.0  MCV 83.9 84.0  PLT 116* 118*   Basic Metabolic Panel Recent Labs    89/83/74 0425 12/24/23 1552 12/25/23 0449  NA 129* 133* 130*  K 4.4 4.5 4.3  CL 101 100 102  CO2 18* 23 18*  GLUCOSE 218* 210* 250*  BUN 35* 34* 38*  CREATININE 1.82* 1.80* 1.83*  CALCIUM  8.0* 7.8* 8.0*  MG 2.7*  --  2.6*  PHOS 2.8 2.7 2.7   Liver Function Tests Recent Labs    12/23/23 0956 12/23/23 1600 12/24/23 1552 12/25/23 0449  AST 47*  --   --   --   ALT 55*  --   --   --   ALKPHOS 62  --   --   --   BILITOT 4.9*  --   --   --   PROT 5.7*  --   --   --   ALBUMIN  3.1*   < > 2.8* 2.8*   < > = values in this interval not displayed.    Medications:     Scheduled Medications:  calcium  citrate  200 mg of  elemental calcium  Per Tube Daily   Chlorhexidine  Gluconate Cloth  6 each Topical Daily   cholecalciferol  1,000 Units Per Tube Daily   dapsone  100 mg Per Tube Daily   feeding supplement  237 mL Per Tube TID BM   feeding supplement (PROSource TF20)  60 mL Per Tube TID   insulin  aspart  0-9 Units Subcutaneous Q4H   insulin  aspart  3 Units Subcutaneous Q4H   insulin  glargine-yfgn  20 Units Subcutaneous QHS   melatonin  5 mg Per Tube QHS   methotrexate  5 mg Oral Weekly   mexiletine  150 mg Per Tube Q8H   multivitamin  1 tablet Per Tube QHS   mouth rinse  15 mL Mouth Rinse 4 times per day   pantoprazole (PROTONIX) IV  40 mg Intravenous Q24H   predniSONE   30 mg Per Tube Q breakfast   rifaximin  550 mg Per Tube BID   sodium chloride  flush  10-40 mL Intracatheter Q12H   sodium chloride  flush  10-40 mL Intracatheter Q12H   sodium chloride  flush  3 mL Intravenous Q12H    Infusions:  sodium chloride  20 mL/hr at 12/25/23 0700   amiodarone  30 mg/hr (12/25/23 0700)   DOBUTamine 7.5 mcg/kg/min (12/25/23 0700)    epinephrine 3 mcg/min (12/25/23 0700)   feeding supplement (VITAL 1.5 CAL) 60 mL/hr at 12/25/23 0700   heparin  1,450 Units/hr (12/25/23 0700)   norepinephrine  (LEVOPHED ) Adult infusion 48 mcg/min (12/25/23 0700)   piperacillin-tazobactam Stopped (12/25/23 0402)   prismasol BGK 4/2.5 400 mL/hr at 12/25/23 0530   prismasol BGK 4/2.5 400 mL/hr at 12/25/23 0530   prismasol BGK 4/2.5 1,500 mL/hr at 12/25/23 0538   vancomycin Stopped (12/24/23 1227)   vasopressin 0.04 Units/min (12/25/23 0700)    PRN Medications: acetaminophen  **OR** acetaminophen , heparin , heparin , mouth rinse, oxyCODONE , prochlorperazine, sodium chloride  flush, sodium chloride  flush, traZODone Patient Profile  58 year old with a history of HFrEF, PAF, OSA, VT, CKD Stage IIIa.   HF following for cardiogenic shock.  Assessment/Plan   A/C HFrEF, Cardiogenic Shock  - Normal coronary arteriography 12/2023 - Cardiac PET 2023- no evidence for cardiac sarcoid or inflammatory. LVEF 38%. - cMRI 2/25 EF 28 % RV 33% LGE pattern concerning for sarcoid.  Has hilar adenopathy on lung imaging.  - Echo 12/12/23 EF < 15 severe RV HK - Appears to be doing better from a VT standpoint but c/w refractory cardiogenic/vasoplegic shock w/ MSOF. On high dose inotrope's and pressors on DBA 7.5, Epi 3, NE 48 + VP 0.04. Co-ox 43%, MAPs marginal in low 60s. Requiring CRRT w/ limited ability to tolerate volume removal d/t hypotension. CVP 20  - increase Epi to 5 for added inotropic and pressor support   - doing poorly. Family has made DNR. Will need ongoing GOC discussion. May be nearing palliative care situation.    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 - underwent DC-CV on 12/10/23 - Maintaining SR/ST. Continue IV amio. Of note he has previously had difficulty taking amio due to nausea.  - Continue heparin   - EP following. If turn around, can consider potential AFL ablation (versus AVN/pacer) + ICD this admit.    VT -S/P DC-CV 10/4  -S/P DC- CV  10/6  - Continue amio drip  - Lidocaine  gtt stopped 10/9. Now on mexiletine.  - EP following.  - Has been electrically quiet since start of steroids  Possible Sarcoid -Consulted EP. -Now on Sarcoid Protocol. Giving pulse steroids x 3 days  and on immunosuppression with methotrexate and prednisone  - No further VT - At this point will hold off on vaccines.   AKI on CKD Stage IIIa  - Creatinine baseline ~ 1.6-1.8 - Oliguric, CRRT started 10/13 - CVP 20, continue CRRT as tolerated. Appreciate nephrology assistance  - Avoid hypotension. Goal MAP >70.    OSA  -Continue CPAP  Hyponatremia  - Hypervolemic hyponatremia - S/p 3% Q8 several days + Tolvaptan 10/13 - Na 130 today, continue CRRT   AMS Elevated LFTs - AST/ALT: 47/55 - Got a dose of lactulose  but had >3 BMs. Switched to rifaximin   - Ammonia 52>51 yesterday  - Continue delirium precautions - Now with cortrak - WBC 18.4>26.2. steroids? On broad spectrum abx. Blood cx 10/15 NGTD  CRITICAL CARE Performed by: Caffie Shed   Total critical care time: 15 minutes  Critical care time was exclusive of separately billable procedures and treating other patients.  Critical care was necessary to treat or prevent imminent or life-threatening deterioration.  Critical care was time spent personally by me on the following activities: development of treatment plan with patient and/or surrogate as well as nursing, discussions with consultants, evaluation of patient's response to treatment, examination of patient, obtaining history from patient or surrogate, ordering and performing treatments and interventions, ordering and review of laboratory studies, ordering and review of radiographic studies, pulse oximetry and re-evaluation of patient's condition.    Length of Stay: [address], PA-C  12/25/2023, 7:35 AM  Advanced Heart Failure Team Pager [phone] (M-F; 7a - 5p)  Please contact [facility] for  night-coverage after hours (5p -7a ) and weekends on [email or web address]  Patient seen with PA, I formulated the plan and agree with the above note.   Wife is at bedside, he does seem more awake this morning and answers some questions though still lethargic.   Co-ox 43% this morning on epinephrine 3, dobutamine 7.5, NE 48, vasopressin 0.04.  MAP right around 65. CVP 20, I/Os net negative 660 yesterday.  Very difficult to pull fluid via CVVH due to low BP.    WBCs 27K today, he is on vancomycin/Zosyn.   No further arrhythmias on amiodarone  + mexiletine as well as sarcoidosis treatment.   He remains on rifaximin + lactulose for hepatic encephalopathy.   General: NAD Neck: JVP 16+, no thyromegaly or thyroid  nodule.  Lungs: Clear to auscultation bilaterally with normal respiratory effort. CV: Nondisplaced PMI.  Heart regular S1/S2, no S3/S4, no murmur.  1+ edema to thighs.  Abdomen: Soft, nontender, no hepatosplenomegaly, no distention.  Skin: Intact without lesions or rashes.  Neurologic: Lethargic but will answer some questions.  Extremities: No clubbing or cyanosis.  HEENT: Normal.   Somewhat more alert today with wife at bedside. Suspect liver failure with elevated NH3 and component of hepatic encephalopathy.   - Continue rifaximin. - Continue lactulose     WBCs continue to rise to 27K.  He is immunosuppressed with steroids and methotrexate.  We are covering empirically with Zosyn and vancomycin.    Multiple episodes of VT initially, now on amiodarone  gtt 30 mg/hr + mexiletine.  Improved since started on steroids. Only intermittent PVCs. He is in NSR.    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  Cardiac PET in 6/23 did not show active inflammation.  He is now being treated empirically with prednisone  30 mg daily + methotrexate 5 mg weekly,  ventricular rhythm  improved. Will keep this going with empiric abx.    Cardiogenic shock with AKI.  Co-ox 43% this morning on dobutamine 7.5, epinephrine 3, NE 48, vasopressin 0.04. Pressor requirement has been gradually escalating. CVVH started 10/13 with intractable volume overload, AKI, and intractable hyponatremia.  Have been struggling to pull fluid via CVVH due to low BP.  He remains markedly volume overloaded with CVP 20 on my read.   - With low BP and low co-ox, increasing epinephrine to 6 today.  - Continue CVVH UF net negative 50-100 cc/hr today, will target MAP > 60 to allow more fluid off.     With multisystem organ failure (heart, liver, kidney) and delirium, his prognosis is increasingly poor.  He is not an LVAD candidate and or a transplant candidate at this point.  I would not place him on [place] ECMO as I do not think we have an end point for him.  Will continue current management with CVVH trying slowly pull volume with pressors support and broad spectrum antibiotics.  After discussion with family yesterday, he is now DNR/DNI.  We will continue goals of care discussions, his wife wants to continue current level of care for the time being.  If no improvement over the next 24 hrs, will need to reassess.   CRITICAL CARE Performed by: Ezra Shuck  Total critical care time: 45 minutes  Critical care time was exclusive of separately billable procedures and treating other patients.  Critical care was necessary to treat or prevent imminent or life-threatening deterioration.  Critical care was time spent personally by me on the following activities: development of treatment plan with patient and/or surrogate as well as nursing, discussions with consultants, evaluation of patient's response to treatment, examination of patient, obtaining history from patient or surrogate, ordering and performing treatments and interventions, ordering and review of laboratory studies, ordering and review of radiographic studies,  pulse oximetry and re-evaluation of patient's condition.  Ezra Shuck 12/25/2023 9:13 AM

## 2023-12-25 NOTE — Progress Notes (Signed)
 Nutrition Follow-up  DOCUMENTATION CODES:   Non-severe (moderate) malnutrition in context of chronic illness  INTERVENTION:   Discussed nutrition poc with PCCM, plan to reduce TF to trickles today given worsening hemodynamics, increased pressor requirements  Tube Feeding via Cortrak: Change to trickle Vital 1.5 at 20 ml/hr Goal TF Vital 1.5 at 60 ml;hr with Pro-Source TF20 60 mL TID TF goal regimen provides 2400 kcals, 157 g of protein and 1094 mL of free water  Pro-Source TF20 60 mL q 4 hours while on trickle , each packet provides 20 grams of protein and 80 kcals.   Continue Renal MVI  D/C Ensure supplement for now  NUTRITION DIAGNOSIS:   Moderate Malnutrition related to chronic illness as evidenced by moderate fat depletion, mild muscle depletion, moderate muscle depletion.  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 Pt with +Cardiogenic shock, worsening AKI  requiring CRRT. PMH includes HTN, NICM, CHF, OSA, PAF  10/04 Admitted, Echo EF <15%, DC-CV 10/05 Started in milrinone  for low output; VT with shock x 1 10/07 Started on steroids for possible sarcoidosis 10/10 Levophed  initiated 10/13 CRRT initiated, RD initial assessment  Pt now DNR/DNI but continuing current care for now  Remains on CRRT, attempting to pull more than 50 ml/hr, CVP 20, oliguric. Noted pressor requirements up, partially related  Levophed  at 51, as high as 57 this AM Vasopressin 0.04 Epinephrine at 6 Dobutamine 7.5  Wife at bedside today and reports pt has been on Zepbound  over the past year. +wt loss but reports pt was still eating well with good  appetite.   Currently on [place] diet but not eating per RN, noted concerns persist re: safety of po. Pt has Cortrak now and receiving nutrition per tube, meds per tube also. Recommend considering NPO for now  Vital 1.5 at 60 ml/hr via Cortrak Abdomen soft, obese. BS present, +flatus +multiple large type 6 BMs on 10/15; none documented since. Lactulose restarted  Labs: Phosphorus 2.7 (wdl) Magnesium 2.6 (H) Potassium 4.3 (wdl) Sodium 130 (L) CBGs 157-234 (goal 140-180)  Meds: Cholecalciferol SS novolog  Novologq 4 hours Semglee Lactulose Rena-vite Prednisone    Diet Order:   Diet Order             Diet full liquid Room service appropriate? Yes with Assist; Fluid consistency: Thin  Diet effective now                   EDUCATION NEEDS:   Not appropriate for education at this time  Skin:  Skin Assessment: Reviewed RN Assessment  Last BM:  10/15 - type 6 x 2  Height:   Ht Readings from Last 1 Encounters:  12/12/23 6' 5 (1.956 m)    Weight:   Wt Readings from Last 1 Encounters:  12/25/23 134.9 kg    BMI:  Body mass index is 35.27 kg/m.  Estimated Nutritional Needs:   Kcal:  2300-2500 kcals  Protein:  140-170 g  Fluid:  1.8 L   Betsey Finger MS, RDN, LDN, CNSC Registered Dietitian 3 Clinical Nutrition RD Inpatient Contact Info in Amion

## 2023-12-25 NOTE — Procedures (Signed)
 I was present at this session of CRRT. I have reviewed the session itself and made appropriate changes.   Vital signs in last 24 hours:  Temp:  [97.5 F (36.4 C)-98 F (36.7 C)] 97.8 F (36.6 C) (10/17 0723) Pulse Rate:  [94-104] 100 (10/17 0824) Resp:  [13-34] 27 (10/17 0840) SpO2:  [87 %-100 %] 96 % (10/17 0824) Arterial Line BP: (65-99)/(37-67) 83/49 (10/17 0840) Weight:  [134.9 kg] 134.9 kg (10/17 0500) Weight change: -9.1 kg Filed Weights   12/23/23 0500 12/24/23 0500 12/25/23 0500  Weight: (!) 146 kg (!) 144 kg 134.9 kg    Recent Labs  Lab 12/25/23 0449  NA 130*  K 4.3  CL 102  CO2 18*  GLUCOSE 250*  BUN 38*  CREATININE 1.83*  CALCIUM  8.0*  PHOS 2.7    Recent Labs  Lab 12/23/23 0411 12/23/23 2041 12/24/23 0425 12/25/23 0449  WBC 18.4*  --  26.2* 27.1*  HGB 16.6 17.0 16.2 16.7  HCT 47.6 50.0 47.3 49.0  MCV 82.5  --  83.9 84.0  PLT 119*  --  116* 118*    Scheduled Meds:  calcium  citrate  200 mg of elemental calcium  Per Tube Daily   Chlorhexidine  Gluconate Cloth  6 each Topical Daily   cholecalciferol  1,000 Units Per Tube Daily   dapsone  100 mg Per Tube Daily   feeding supplement  237 mL Per Tube TID BM   feeding supplement (PROSource TF20)  60 mL Per Tube TID   insulin  aspart  0-20 Units Subcutaneous Q4H   insulin  aspart  5 Units Subcutaneous Q4H   insulin  glargine-yfgn  20 Units Subcutaneous QHS   lactulose  10 g Per Tube TID   melatonin  5 mg Per Tube QHS   methotrexate  5 mg Oral Weekly   mexiletine  150 mg Per Tube Q8H   multivitamin  1 tablet Per Tube QHS   mouth rinse  15 mL Mouth Rinse 4 times per day   pantoprazole (PROTONIX) IV  40 mg Intravenous Q24H   predniSONE   30 mg Per Tube Q breakfast   rifaximin  550 mg Per Tube BID   sodium chloride  flush  10-40 mL Intracatheter Q12H   sodium chloride  flush  10-40 mL Intracatheter Q12H   sodium chloride  flush  3 mL Intravenous Q12H   Continuous Infusions:  sodium chloride  20 mL/hr at  12/25/23 0800   amiodarone  30 mg/hr (12/25/23 0800)   DOBUTamine 7.5 mcg/kg/min (12/25/23 0800)   epinephrine 6 mcg/min (12/25/23 0912)   feeding supplement (VITAL 1.5 CAL) 60 mL/hr at 12/25/23 0800   heparin  1,450 Units/hr (12/25/23 0800)   norepinephrine  (LEVOPHED ) Adult infusion 49 mcg/min (12/25/23 0800)   piperacillin-tazobactam Stopped (12/25/23 0402)   prismasol BGK 4/2.5 400 mL/hr at 12/25/23 0530   prismasol BGK 4/2.5 400 mL/hr at 12/25/23 0530   prismasol BGK 4/2.5 1,500 mL/hr at 12/25/23 0902   vancomycin Stopped (12/24/23 1227)   vasopressin 0.04 Units/min (12/25/23 0800)   PRN Meds:.acetaminophen  **OR** acetaminophen , heparin , heparin , mouth rinse, oxyCODONE , prochlorperazine, sodium chloride  flush, sodium chloride  flush, traZODone    Assessment/Plan:  AKI/CKD stage IIIa - in setting of biventricular failure and ongoing diuresis +/- IV contrast for heart cath on 12/15/23.  He is not responding well to maximal medical therapy, although his Na and Scr have slightly improved.  RIJ Temp HD cath placed 12/21/23 by PCCM and initiated CRRT to help manage his volume and hyponatremia. Unable to UF yesterday due to  low bp but now on higher dose of epi gtt, CVP now 20.  All fluids 4K/2.5Ca, no heparin  since he is on systemic heparin  for cardiac issues.  UF goal 50-150 mL/hr, but only able to set 100 mL/hr due to low MAP.  Will continue with current settings.  Continue with lasix  gtt for now as well. Avoid nephrotoxic medications including NSAIDs and iodinated intravenous contrast exposure unless the latter is absolutely indicated.   Preferred narcotic agents for pain control are hydromorphone, fentanyl , and methadone. Morphine should not be used.  Avoid Baclofen and avoid oral sodium phosphate  and magnesium citrate based laxatives / bowel preps.  Continue strict Input and Output monitoring. Will monitor the patient closely with you and intervene or adjust therapy as indicated by changes in  clinical status/labs  Acute on chronic biventricular heart failure - normal cardiac cath on 12/15/23.  CVP remains elevated.  Currently on IV lasix  gtt at 40 mg/hr, milrinone , levophed , and vasopressin.  UOP down to 1600 overnight but leaked around foley.  Stopped lasix  drip on 12/23/23 with drop in UOP.  GDMT limited by hypotension.  Epi added to drips per advanced heart failure team. 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  Started on pulse steroids now on methotrexate and prednisone .  Plan per advanced heart failure team. Persistent atrial fibrillation - despite ablation and DCCV.  Currently on IV amiodarone .  EP following. H/o V tach - lidocaine  gtt stopped 12/17/23.  Now on mexiletine.  EP following. OSA - continue with CPAP Hypervolemic hyponatremia - also with AKI.  Improved with CRRT but stuck at 129. Disposition - prognosis is guarded as he has not really improved since starting CRRT.  Need to discuss goals of care with family.  Now DNR.  Fairy DELENA Sellar,  MD 12/25/2023, 9:22 AM

## 2023-12-26 DIAGNOSIS — I5023 Acute on chronic systolic (congestive) heart failure: Secondary | ICD-10-CM | POA: Diagnosis not present

## 2023-12-26 LAB — RENAL FUNCTION PANEL
Albumin: 2.9 g/dL — ABNORMAL LOW (ref 3.5–5.0)
Albumin: 2.8 g/dL — ABNORMAL LOW (ref 3.5–5.0)
Anion gap: 13 (ref 5–15)
Anion gap: 10 (ref 5–15)
BUN: 41 mg/dL — ABNORMAL HIGH (ref 6–20)
BUN: 45 mg/dL — ABNORMAL HIGH (ref 6–20)
CO2: 19 mmol/L — ABNORMAL LOW (ref 22–32)
CO2: 21 mmol/L — ABNORMAL LOW (ref 22–32)
Calcium: 8.3 mg/dL — ABNORMAL LOW (ref 8.9–10.3)
Calcium: 7.8 mg/dL — ABNORMAL LOW (ref 8.9–10.3)
Chloride: 102 mmol/L (ref 98–111)
Chloride: 102 mmol/L (ref 98–111)
Creatinine, Ser: 2 mg/dL — ABNORMAL HIGH (ref 0.61–1.24)
Creatinine, Ser: 1.96 mg/dL — ABNORMAL HIGH (ref 0.61–1.24)
GFR, Estimated: 38 mL/min — ABNORMAL LOW (ref >60)
GFR, Estimated: 39 mL/min — ABNORMAL LOW (ref >60)
Glucose, Bld: 97 mg/dL (ref 70–99)
Glucose, Bld: 178 mg/dL — ABNORMAL HIGH (ref 70–99)
Phosphorus: 3 mg/dL (ref 2.5–4.6)
Phosphorus: 3.1 mg/dL (ref 2.5–4.6)
Potassium: 4.3 mmol/L (ref 3.5–5.1)
Potassium: 4.4 mmol/L (ref 3.5–5.1)
Sodium: 134 mmol/L — ABNORMAL LOW (ref 135–145)
Sodium: 133 mmol/L — ABNORMAL LOW (ref 135–145)

## 2023-12-26 LAB — GLUCOSE, CAPILLARY
Glucose-Capillary: 81 mg/dL (ref 70–99)
Glucose-Capillary: 90 mg/dL (ref 70–99)
Glucose-Capillary: 116 mg/dL — ABNORMAL HIGH (ref 70–99)
Glucose-Capillary: 145 mg/dL — ABNORMAL HIGH (ref 70–99)
Glucose-Capillary: 156 mg/dL — ABNORMAL HIGH (ref 70–99)
Glucose-Capillary: 175 mg/dL — ABNORMAL HIGH (ref 70–99)
Glucose-Capillary: 181 mg/dL — ABNORMAL HIGH (ref 70–99)

## 2023-12-26 LAB — MAGNESIUM: Magnesium: 2.8 mg/dL — ABNORMAL HIGH (ref 1.7–2.4)

## 2023-12-26 LAB — COOXEMETRY PANEL
Carboxyhemoglobin: 1.5 % (ref 0.5–1.5)
Carboxyhemoglobin: 2.1 % — ABNORMAL HIGH (ref 0.5–1.5)
Methemoglobin: 0.8 % (ref 0.0–1.5)
Methemoglobin: <0.7 % (ref 0.0–1.5)
O2 Saturation: 52.5 %
O2 Saturation: 50.2 %
Total hemoglobin: 16.4 g/dL — ABNORMAL HIGH (ref 12.0–16.0)
Total hemoglobin: 16.5 g/dL — ABNORMAL HIGH (ref 12.0–16.0)

## 2023-12-26 LAB — QUANTIFERON-TB GOLD PLUS: QuantiFERON-TB Gold Plus: UNDETERMINED — AB

## 2023-12-26 LAB — CBC
HCT: 49 % (ref 39.0–52.0)
Hemoglobin: 16.9 g/dL (ref 13.0–17.0)
MCH: 29.2 pg (ref 26.0–34.0)
MCHC: 34.5 g/dL (ref 30.0–36.0)
MCV: 84.6 fL (ref 80.0–100.0)
Platelets: 101 K/uL — ABNORMAL LOW (ref 150–400)
RBC: 5.79 MIL/uL (ref 4.22–5.81)
RDW: 17.2 % — ABNORMAL HIGH (ref 11.5–15.5)
WBC: 29.1 K/uL — ABNORMAL HIGH (ref 4.0–10.5)
nRBC: 16.9 % — ABNORMAL HIGH (ref 0.0–0.2)

## 2023-12-26 LAB — CG4 I-STAT (LACTIC ACID): Lactic Acid, Venous: 1.7 mmol/L (ref 0.5–1.9)

## 2023-12-26 LAB — QUANTIFERON-TB GOLD PLUS (RQFGPL)
QuantiFERON Mitogen Value: 0.43 [IU]/mL
QuantiFERON Nil Value: 0.02 [IU]/mL
QuantiFERON TB1 Ag Value: 0.02 [IU]/mL
QuantiFERON TB2 Ag Value: 0.03 [IU]/mL

## 2023-12-26 LAB — HEPARIN LEVEL (UNFRACTIONATED)
Heparin Unfractionated: 0.22 [IU]/mL — ABNORMAL LOW (ref 0.30–0.70)
Heparin Unfractionated: 0.26 [IU]/mL — ABNORMAL LOW (ref 0.30–0.70)
Heparin Unfractionated: 0.4 [IU]/mL (ref 0.30–0.70)

## 2023-12-26 LAB — LACTIC ACID, PLASMA: Lactic Acid, Venous: 2.4 mmol/L (ref 0.5–1.9)

## 2023-12-26 MED ORDER — LACTULOSE 10 GM/15ML PO SOLN
10.0000 g | Freq: Two times a day (BID) | ORAL | Status: DC
Start: 2023-12-26 — End: 2023-12-27
  Administered 2023-12-26 (×2): 10 g
  Filled 2023-12-26 (×2): qty 15

## 2023-12-26 MED ORDER — EPINEPHRINE HCL 5 MG/250ML IV SOLN IN NS
10.0000 ug/min | INTRAVENOUS | Status: DC
  Administered 2023-12-26 – 2023-12-27 (×2): 7 ug/min via INTRAVENOUS
  Administered 2023-12-27 – 2023-12-29 (×4): 8 ug/min via INTRAVENOUS
  Filled 2023-12-26 (×7): qty 250

## 2023-12-26 NOTE — Progress Notes (Addendum)
 PHARMACY - ANTICOAGULATION CONSULT NOTE  Pharmacy Consult for heparin  Indication: atrial fibrillation  Allergies  Allergen Reactions   Bee Venom Anaphylaxis    Patient Measurements: Height: 6' 5 (195.6 cm) Weight: 132.5 kg (292 lb 1.8 oz) (weight including footboard) IBW/kg (Calculated) : 89.1 HEPARIN  DW (KG): 119  Vital Signs: Temp: 98 F (36.7 C) (10/18 0314) Temp Source: Axillary (10/18 0314) Pulse Rate: 101 (10/18 0545)  Labs: Recent Labs    12/24/23 0425 12/24/23 1552 12/25/23 0449 12/25/23 1531 12/26/23 0418  HGB 16.2  --  16.7  --  16.9  HCT 47.3  --  49.0  --  49.0  PLT 116*  --  118*  --  101*  HEPARINUNFRC 0.41  --  0.30  --  0.22*  CREATININE 1.82*   < > 1.83* 1.91* 2.00*   < > = values in this interval not displayed.    Estimated Creatinine Clearance: 59.9 mL/min (A) (by C-G formula based on SCr of 2 mg/dL (H)).   Medications:   Infusions:   sodium chloride  20 mL/hr at 12/26/23 0600   amiodarone  30 mg/hr (12/26/23 0600)   DOBUTamine 7.5 mcg/kg/min (12/26/23 0640)   epinephrine 6 mcg/min (12/26/23 0600)   feeding supplement (VITAL 1.5 CAL) 20 mL/hr at 12/26/23 0600   heparin  1,500 Units/hr (12/26/23 0600)   norepinephrine  (LEVOPHED ) Adult infusion 30 mcg/min (12/26/23 0600)   piperacillin-tazobactam Stopped (12/26/23 0511)   prismasol BGK 4/2.5 400 mL/hr at 12/25/23 1801   prismasol BGK 4/2.5 400 mL/hr at 12/25/23 1801   prismasol BGK 4/2.5 1,500 mL/hr at 12/26/23 0501   vancomycin Stopped (12/25/23 1208)   vasopressin 0.04 Units/min (12/26/23 0600)    Assessment: 58 YOM admitted with atrial fibrillation with rapid ventricular response. Patient underwent DCCV on 12/10/23 prior to this admission. Patient was shocked on 12/12/23 PM, now in NSR. Patient on Eliquis  prior to hospitalization, last dose on 12/11/23. Plan to start heparin  infusion prior to possible procedures. Pharmacy consulted for heparin  dosing.   Heparin  level is subtherapeutic at  0.22, on heparin  1500 units/hr.  No issues with infusion running or signs of bleeding per RN.  CBC stable (Hgb 16.9, PLT 101).   Goal of Therapy:  Heparin  level 0.3-0.7 units/ml Monitor platelets by anticoagulation protocol: Yes   Plan:  Increase heparin  infusion to 1600 units/hr to keep in goal range  6h heparin  level Monitor heparin  level, CBC, and s/sx of bleeding daily  ADDENDUM - 6h heparin  level 0.26 still below goal on 1600 units/hr.  No issues. Plan - increase heparin  IV to 1700 units/hr.  6h heparin  level.  Thank you for allowing pharmacy to participate in this patient's care,  Maurilio Fila, PharmD Clinical Pharmacist 12/26/2023  6:52 AM

## 2023-12-26 NOTE — Progress Notes (Signed)
 Discussed with advanced heart failure team, patient is in multisystem organ failure, on CRRT.  At this time PCCM will sign off.  Please call with questions   Valinda Novas, MD

## 2023-12-26 NOTE — Plan of Care (Signed)
  Problem: Safety: Goal: Ability to remain free from injury will improve Outcome: Progressing Note: One on one due to CRRT, frequent verbal contacts with reorientation   Problem: Skin Integrity: Goal: Risk for impaired skin integrity will decrease Outcome: Progressing   Problem: Education: Goal: Ability to demonstrate management of disease process will improve Outcome: Progressing Note: Medication to address encephalopathy   Problem: Cardiac: Goal: Ability to achieve and maintain adequate cardiopulmonary perfusion will improve Outcome: Progressing   Problem: Fluid Volume: Goal: Ability to maintain a balanced intake and output will improve Outcome: Progressing Note: CRRT, pressor, inotropic support

## 2023-12-26 NOTE — Progress Notes (Signed)
 Patient ID: Harold Park, male   DOB: [birth year], 58 y.o.   MRN: [id number] S: CRRT currently paused for replacement of system.  UF limited by hypotension.  Family agreed to change to DNR but otherwise, continue with current treatments. O:BP (!) 109/39   Pulse (!) 130   Temp 98 F (36.7 C) (Axillary)   Resp (!) 29   Ht 6' 5 (1.956 m)   Wt 132.5 kg Comment: weight including footboard  SpO2 100%   BMI 34.64 kg/m   Intake/Output Summary (Last 24 hours) at 12/26/2023 0846 Last data filed at 12/26/2023 0800 Gross per 24 hour  Intake 4892.42 ml  Output 8754 ml  Net -3861.58 ml   Intake/Output: I/O last 3 completed shifts: In: 7447.8 [I.V.:4890.7; NG/GT:1956.8; IV Piggyback:600.2] Out: 88001 [Urine:485]  Intake/Output this shift:  Total I/O In: 146.7 [I.V.:126.7; NG/GT:20] Out: -  Weight change: -2.4 kg Gen: Fatigued, ill-appearing CVS: tachy Resp: CTA Abd: obese, +BS, soft, NT/ND Ext: 1+ edema bilateral lower ext with cyanosis of toes bilaterally  Recent Labs  Lab 12/19/23 1405 12/19/23 1447 12/20/23 0514 12/21/23 0810 12/21/23 1138 12/23/23 0411 12/23/23 0956 12/23/23 1600 12/23/23 2041 12/24/23 0425 12/24/23 1552 12/25/23 0449 12/25/23 1531 12/26/23 0418  NA 115* 115*   < > 120*   < > 129*  --  129* 132* 129* 133* 130* 132* 134*  K 4.5 4.4   < > 4.0   < > 4.5  --  4.6 4.1 4.4 4.5 4.3 4.5 4.3  CL 84* 85*   < > 90*   < > 99  --  99  --  101 100 102 102 102  CO2 17* 15*   < > 15*   < > 18*  --  17*  --  18* 23 18* 18* 19*  GLUCOSE 271* 221*   < > 161*   < > 106*  --  204*  --  218* 210* 250* 197* 97  BUN 77* 78*   < > 81*   < > 36*  --  36*  --  35* 34* 38* 39* 41*  CREATININE 3.62* 3.62*   < > 2.89*   < > 1.77*  --  1.92*  --  1.82* 1.80* 1.83* 1.91* 2.00*  ALBUMIN  2.9* 3.0*  --  3.1*   < > 2.9* 3.1* 2.9*  --  3.0* 2.8* 2.8* 2.9* 2.9*  CALCIUM  8.1* 8.2*   < > 8.2*   < > 8.1*  --  8.0*  --  8.0* 7.8* 8.0* 7.9* 8.3*  PHOS  --   --   --   --    < > 2.6  --  3.2   --  2.8 2.7 2.7 2.7 3.0  AST 36 34  --  38  --   --  47*  --   --   --   --   --   --   --   ALT 49* 52*  --  55*  --   --  55*  --   --   --   --   --   --   --    < > = values in this interval not displayed.   Liver Function Tests: Recent Labs  Lab 12/19/23 1447 12/21/23 0810 12/21/23 1542 12/23/23 0956 12/23/23 1600 12/25/23 0449 12/25/23 1531 12/26/23 0418  AST 34 38  --  47*  --   --   --   --   ALT 52* 55*  --  55*  --   --   --   --   ALKPHOS 56 64  --  62  --   --   --   --   BILITOT 1.2 2.2*  --  4.9*  --   --   --   --   PROT 5.6* 5.7*  --  5.7*  --   --   --   --   ALBUMIN  3.0* 3.1*   < > 3.1*   < > 2.8* 2.9* 2.9*   < > = values in this interval not displayed.   No results for input(s): LIPASE, AMYLASE in the last 168 hours. Recent Labs  Lab 12/23/23 0810 12/24/23 0425 12/25/23 0926  AMMONIA 52* 51* 68*   CBC: Recent Labs  Lab 12/22/23 0432 12/23/23 0411 12/23/23 2041 12/24/23 0425 12/25/23 0449 12/26/23 0418  WBC 14.9* 18.4*  --  26.2* 27.1* 29.1*  HGB 16.8 16.6   < > 16.2 16.7 16.9  HCT 47.1 47.6   < > 47.3 49.0 49.0  MCV 81.3 82.5  --  83.9 84.0 84.6  PLT 140* 119*  --  116* 118* 101*   < > = values in this interval not displayed.   Cardiac Enzymes: No results for input(s): CKTOTAL, CKMB, CKMBINDEX, TROPONINI in the last 168 hours. CBG: Recent Labs  Lab 12/25/23 2118 12/25/23 2314 12/26/23 0314 12/26/23 0424 12/26/23 0755  GLUCAP 117* 105* 81 90 116*    Iron Studies: No results for input(s): IRON, TIBC, TRANSFERRIN, FERRITIN in the last 72 hours. Studies/Results: No results found.  calcium  citrate  200 mg of elemental calcium  Per Tube Daily   Chlorhexidine  Gluconate Cloth  6 each Topical Daily   cholecalciferol  1,000 Units Per Tube Daily   dapsone  100 mg Per Tube Daily   feeding supplement (PROSource TF20)  60 mL Per Tube Q4H   insulin  aspart  0-20 Units Subcutaneous Q4H   insulin  glargine-yfgn  20 Units  Subcutaneous QHS   lactulose  10 g Per Tube BID   melatonin  5 mg Per Tube QHS   mexiletine  150 mg Per Tube Q8H   multivitamin  1 tablet Per Tube QHS   mouth rinse  15 mL Mouth Rinse 4 times per day   pantoprazole (PROTONIX) IV  40 mg Intravenous Q24H   predniSONE   30 mg Per Tube Q breakfast   rifaximin  550 mg Per Tube BID   sodium chloride  flush  10-40 mL Intracatheter Q12H   sodium chloride  flush  10-40 mL Intracatheter Q12H   sodium chloride  flush  3 mL Intravenous Q12H    BMET    Component Value Date/Time   NA 134 (L) 12/26/2023 0418   NA 137 12/02/2023 1015   K 4.3 12/26/2023 0418   CL 102 12/26/2023 0418   CO2 19 (L) 12/26/2023 0418   GLUCOSE 97 12/26/2023 0418   BUN 41 (H) 12/26/2023 0418   BUN 17 12/02/2023 1015   CREATININE 2.00 (H) 12/26/2023 0418   CREATININE 1.15 10/05/2023 0842   CREATININE 1.19 06/25/2015 0939   CALCIUM  8.3 (L) 12/26/2023 0418   GFRNONAA 38 (L) 12/26/2023 0418   GFRNONAA >60 10/05/2023 0842   GFRAA >60 02/16/2019 0939   CBC    Component Value Date/Time   WBC 29.1 (H) 12/26/2023 0418   RBC 5.79 12/26/2023 0418   HGB 16.9 12/26/2023 0418   HGB 12.7 (L) 12/16/2023 0949   HCT 49.0 12/26/2023 0418   HCT  51.2 (H) 12/02/2023 1015   PLT 101 (L) 12/26/2023 0418   PLT 274 12/02/2023 1015   MCV 84.6 12/26/2023 0418   MCV 87 12/02/2023 1015   MCH 29.2 12/26/2023 0418   MCHC 34.5 12/26/2023 0418   RDW 17.2 (H) 12/26/2023 0418   RDW 12.9 12/02/2023 1015   LYMPHSABS 0.5 (L) 12/16/2023 0949   MONOABS 0.2 12/16/2023 0949   EOSABS 0.0 12/16/2023 0949   BASOSABS 0.0 12/16/2023 0949     Assessment/Plan:  AKI/CKD stage IIIa - in setting of biventricular failure and ongoing diuresis +/- IV contrast for heart cath on 12/15/23.  He is not responding well to maximal medical therapy, although his Na and Scr have slightly improved.  RIJ Temp HD cath placed 12/21/23 by PCCM and initiated CRRT to help manage his volume and hyponatremia. He was placed on a  higher dose of epi gtt on 12/25/23, CVP remains 18- 20.  All fluids 4K/2.5Ca, no heparin  since he is on systemic heparin  for cardiac issues.  UF goal 50-150 mL/hr, but only able to set 100 mL/hr due to low MAP.  Will continue with current settings.  Off of lasix  drip.  Unfortunately having to increase pressor support for volume removal.   Avoid nephrotoxic medications including NSAIDs and iodinated intravenous contrast exposure unless the latter is absolutely indicated.   Preferred narcotic agents for pain control are hydromorphone, fentanyl , and methadone. Morphine should not be used.  Avoid Baclofen and avoid oral sodium phosphate  and magnesium citrate based laxatives / bowel preps.  Continue strict Input and Output monitoring. Will monitor the patient closely with you and intervene or adjust therapy as indicated by changes in clinical status/labs  Acute on chronic biventricular heart failure - normal cardiac cath on 12/15/23.  CVP remains elevated.  Currently, dobutamine, levophed , vasopressin, and epi.  UOP down from 1600 to 330 overnight.  Stopped lasix  drip on 12/23/23 with drop in UOP.  GDMT limited by hypotension.  Epi added to drips per advanced heart failure team. 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  Started on pulse steroids now on methotrexate and prednisone .  Plan per advanced heart failure team. Persistent atrial fibrillation - despite ablation and DCCV.  Currently on IV amiodarone .  EP following. H/o V tach - lidocaine  gtt stopped 12/17/23.  Now on mexiletine.  EP following. OSA - continue with CPAP Hypervolemic hyponatremia - also with AKI.  Improved with CRRT but stuck at 129. Disposition - prognosis is guarded as he has not really improved since starting CRRT.  Need to discuss goals of care with family.  Now DNR and would recommend transition to comfort measures if his clinical status continues to decline.  Fairy RONAL Sellar, MD [facility]

## 2023-12-26 NOTE — Progress Notes (Signed)
 PHARMACY - ANTICOAGULATION CONSULT NOTE  Pharmacy Consult for heparin  Indication: atrial fibrillation  Allergies  Allergen Reactions   Bee Venom Anaphylaxis    Patient Measurements: Height: 6' 5 (195.6 cm) Weight: 132.5 kg (292 lb 1.8 oz) (weight including footboard) IBW/kg (Calculated) : 89.1 HEPARIN  DW (KG): 119  Vital Signs: Temp: 98.7 F (37.1 C) (10/18 1913) Temp Source: Axillary (10/18 1913) Pulse Rate: 109 (10/18 2130)  Labs: Recent Labs    12/24/23 0425 12/24/23 1552 12/25/23 0449 12/25/23 1531 12/26/23 0418 12/26/23 1316 12/26/23 1547 12/26/23 2118  HGB 16.2  --  16.7  --  16.9  --   --   --   HCT 47.3  --  49.0  --  49.0  --   --   --   PLT 116*  --  118*  --  101*  --   --   --   HEPARINUNFRC 0.41  --  0.30  --  0.22* 0.26*  --  0.40  CREATININE 1.82*   < > 1.83* 1.91* 2.00*  --  1.96*  --    < > = values in this interval not displayed.    Estimated Creatinine Clearance: 61.1 mL/min (A) (by C-G formula based on SCr of 1.96 mg/dL (H)).   Medications:   Infusions:   sodium chloride  20 mL/hr at 12/26/23 2100   amiodarone  30 mg/hr (12/26/23 2100)   DOBUTamine 7.5 mcg/kg/min (12/26/23 2100)   epinephrine 7 mcg/min (12/26/23 2100)   feeding supplement (VITAL 1.5 CAL) 20 mL/hr at 12/26/23 2100   heparin  1,700 Units/hr (12/26/23 2100)   norepinephrine  (LEVOPHED ) Adult infusion 45 mcg/min (12/26/23 2100)   piperacillin-tazobactam 3.375 g (12/26/23 2117)   prismasol BGK 4/2.5 400 mL/hr at 12/25/23 1801   prismasol BGK 4/2.5 400 mL/hr at 12/25/23 1801   prismasol BGK 4/2.5 1,500 mL/hr at 12/26/23 2154   vancomycin Stopped (12/26/23 1135)   vasopressin 0.04 Units/min (12/26/23 2100)    Assessment: 58 YOM admitted with atrial fibrillation with rapid ventricular response. Patient underwent DCCV on 12/10/23 prior to this admission. Patient was shocked on 12/12/23 PM, now in NSR. Patient on Eliquis  prior to hospitalization, last dose on 12/11/23. Plan to start  heparin  infusion prior to possible procedures. Pharmacy consulted for heparin  dosing.   Heparin  level is therapeutic at 0.4, on heparin  drip rate 1700 units/hr.  No issues with infusion running or signs of bleeding per RN.  CBC stable (Hgb 16.9, PLT 101).   Goal of Therapy:  Heparin  level 0.3-0.7 units/ml Monitor platelets by anticoagulation protocol: Yes   Plan:  Continue heparin  infusion 1700 units/hr  Monitor heparin  level, CBC, and s/sx of bleeding daily   Olam Chalk Pharm.D. CPP, BCPS Clinical Pharmacist [phone] 12/26/2023 10:14 PM

## 2023-12-26 NOTE — Progress Notes (Signed)
 IP rehab screen - Noted PT recommending rehab consult.  Patient screened and I will place an order for a rehab consult per protocol.  [phone]

## 2023-12-26 NOTE — Progress Notes (Signed)
 Patient ID: Harold Park, male   DOB: 06-06-1965, 58 y.o.   MRN: [id number]     Advanced Heart Failure Rounding Note  Cardiologist: Wilbert Bihari, MD  Chief Complaint: A/C HF Subjective:   10/6 Started on Milirnone 0.125 for low output.  Developed VTwith shock x1. Sleeping at the time but described some chest burning. Lidocaine  added. K supplemented.  10/7 Cath - coronaries ok. Elevated filling pressures, CI 1.7. Milrinone  increased to 0.25 mcg. Started on pulse steroids for possible sarcoid 10/13: Milrinone  switched to DBA  Continues on high pressor/inotropic support, on DBA 7.5, Epi 6, NE 38 + VP 0.04.  Early am co-ox 52%.  MAP low 60s.  We were able to pull more via CVVH yesterday, I/Os net negative 100-150 cc/hr with weight down 5 lbs.  CVP still high at 21.   On Vanc and Zosyn, WBC 26>>27>>29K. BCx remain negative.  Last PCT 0.22.   Rhythm stable. No VT on amiodarone  gtt.    Seems more awake this morning but still confused.  On lactulose and rifaximin for hepatic encephalopathy.    Objective:   Weight Range: 132.5 kg Body mass index is 34.64 kg/m.   Vital Signs:   Temp:  [97 F (36.1 C)-98.6 F (37 C)] 98 F (36.7 C) (10/18 0314) Pulse Rate:  [93-290] 98 (10/18 0715) Resp:  [16-50] 20 (10/18 0715) SpO2:  [72 %-100 %] 93 % (10/18 0715) Arterial Line BP: (67-147)/(38-114) 85/57 (10/18 0715) Weight:  [132.5 kg] 132.5 kg (10/18 0630) Last BM Date : 12/23/23  Weight change: Filed Weights   12/24/23 0500 12/25/23 0500 12/26/23 0630  Weight: (!) 144 kg 134.9 kg 132.5 kg    Intake/Output:   Intake/Output Summary (Last 24 hours) at 12/26/2023 0825 Last data filed at 12/26/2023 0700 Gross per 24 hour  Intake 4745.68 ml  Output 8754 ml  Net -4008.32 ml      Physical Exam   CVP 21 General: NAD Neck: JVP 16+, no thyromegaly or thyroid  nodule.  Lungs: Decreased at bases.  CV: Nondisplaced PMI.  Heart regular S1/S2, no S3/S4, no murmur.  2+ edema to thighs.   Abdomen: Soft, nontender, no hepatosplenomegaly, no distention.  Skin: Intact without lesions or rashes.  Neurologic: Awake, confused.  Extremities: No clubbing or cyanosis.  HEENT: Normal.   Telemetry   ST low 100s, no VT  (Personally reviewed)    Labs    CBC Recent Labs    12/25/23 0449 12/26/23 0418  WBC 27.1* 29.1*  HGB 16.7 16.9  HCT 49.0 49.0  MCV 84.0 84.6  PLT 118* 101*   Basic Metabolic Panel Recent Labs    89/82/74 0449 12/25/23 1531 12/26/23 0418  NA 130* 132* 134*  K 4.3 4.5 4.3  CL 102 102 102  CO2 18* 18* 19*  GLUCOSE 250* 197* 97  BUN 38* 39* 41*  CREATININE 1.83* 1.91* 2.00*  CALCIUM  8.0* 7.9* 8.3*  MG 2.6*  --  2.8*  PHOS 2.7 2.7 3.0   Liver Function Tests Recent Labs    12/23/23 0956 12/23/23 1600 12/25/23 1531 12/26/23 0418  AST 47*  --   --   --   ALT 55*  --   --   --   ALKPHOS 62  --   --   --   BILITOT 4.9*  --   --   --   PROT 5.7*  --   --   --   ALBUMIN  3.1*   < > 2.9* 2.9*   < > =  values in this interval not displayed.    Medications:     Scheduled Medications:  calcium  citrate  200 mg of elemental calcium  Per Tube Daily   Chlorhexidine  Gluconate Cloth  6 each Topical Daily   cholecalciferol  1,000 Units Per Tube Daily   dapsone  100 mg Per Tube Daily   feeding supplement (PROSource TF20)  60 mL Per Tube Q4H   insulin  aspart  0-20 Units Subcutaneous Q4H   insulin  glargine-yfgn  20 Units Subcutaneous QHS   lactulose  10 g Per Tube BID   melatonin  5 mg Per Tube QHS   mexiletine  150 mg Per Tube Q8H   multivitamin  1 tablet Per Tube QHS   mouth rinse  15 mL Mouth Rinse 4 times per day   pantoprazole (PROTONIX) IV  40 mg Intravenous Q24H   predniSONE   30 mg Per Tube Q breakfast   rifaximin  550 mg Per Tube BID   sodium chloride  flush  10-40 mL Intracatheter Q12H   sodium chloride  flush  10-40 mL Intracatheter Q12H   sodium chloride  flush  3 mL Intravenous Q12H    Infusions:  sodium chloride  20 mL/hr at 12/26/23  0700   amiodarone  30 mg/hr (12/26/23 0700)   DOBUTamine 7.5 mcg/kg/min (12/26/23 0700)   epinephrine     feeding supplement (VITAL 1.5 CAL) 20 mL/hr at 12/26/23 0700   heparin  1,600 Units/hr (12/26/23 0739)   norepinephrine  (LEVOPHED ) Adult infusion 30 mcg/min (12/26/23 0700)   piperacillin-tazobactam Stopped (12/26/23 0511)   prismasol BGK 4/2.5 400 mL/hr at 12/25/23 1801   prismasol BGK 4/2.5 400 mL/hr at 12/25/23 1801   prismasol BGK 4/2.5 1,500 mL/hr at 12/26/23 0501   vancomycin Stopped (12/25/23 1208)   vasopressin 0.04 Units/min (12/26/23 0700)    PRN Medications: acetaminophen  **OR** acetaminophen , heparin , heparin , mouth rinse, oxyCODONE , prochlorperazine, sodium chloride  flush, sodium chloride  flush, traZODone Patient Profile  58 year old with a history of HFrEF, PAF, OSA, VT, CKD Stage IIIa.   HF following for cardiogenic shock.  Assessment/Plan   A/C HFrEF, Cardiogenic Shock  - Normal coronary arteriography 12/2023 - cMRI 2/25 EF 28 % RV 33% LGE pattern concerning for sarcoid.   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  Cardiac PET in 6/23 did not show active inflammation.  He is now being treated empirically with prednisone  30 mg daily + methotrexate 5 mg weekly (methotrexate on hold as it cannot be crushed), ventricular rhythm improved.   - Appears to be doing better from a VT standpoint but c/w refractory cardiogenic/vasoplegic shock w/ multisystem organ failure. Gradually escalating pressors/inotropes with DBA 7.5, Epi 6, NE 38 + VP 0.04. Co-ox 52%, MAP low 60s.   - Increase epinephrine to 7 today.    - Marked volume overload with biventricular failure.  CVP still 21.  Did better with CVVH yesterday, I/Os net negative -4049.  Continue CVVH aiming for net negative 100-150 cc/hr today, tolerate MAP > 60.   - With multisystem organ failure (heart, liver, kidney) and delirium, his prognosis is increasingly poor.  He is not an LVAD candidate and or a transplant candidate at this point.  I would not place him on [place] ECMO as I do not think we have an end point for him.  Will continue current management with CVVH trying to pull volume with pressor support and  broad spectrum antibiotics.  After discussion with family, he is now DNR/DNI.  We will continue goals of care discussions, his wife wants to continue current level of care for the time being. Will continue current support as above, if he further deteriorates will need discussion of comfort care.    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 - underwent DC-CV on 12/10/23 - Maintaining SR/ST. Continue IV amio. Of note he has previously had difficulty taking amiodarone  due to nausea.  - Continue heparin   - EP following. If turns around, can consider potential AFL ablation (versus AVN/pacer) + ICD.    VT - S/P DC-CV 10/4  - S/P DC- CV 10/6  - Continue amiodarone  drip  - Lidocaine  gtt stopped 10/9. Now on mexiletine.  - EP following.  - Has been electrically quiet since start of steroids  Possible Sarcoid - Consulted EP. - Now on prednisone  + methotrexate, methotrexate held though as it cannot be crushed.  - No further VT  AKI on CKD Stage IIIa  - Creatinine baseline ~ 1.6-1.8 - Oliguric, CRRT started 10/13 - CVP 21, ongoing CVVH as above. Appreciate nephrology assistance   OSA  -Continue CPAP  Hyponatremia  - Hypervolemic hyponatremia - S/p 3% Q8 several days + Tolvaptan 10/13 - Now on CVVH for management.   AMS Elevated LFTs - Suspect component of hepatic encephalopathy with delirium.  - Ammonia 52>51  - On lactulose and rifaximin, seems a bit clearer this morning.   ID - Immunosuppressed with steroids.  - With refractory hypotension, was started on empiric vancomycin + Zosyn.  - Last PCT was only 0.22.  - WBCs 29 but on steroids. Blood  cultures NGTD.      CRITICAL CARE Performed by: Ezra Shuck  Total critical care time: 45 minutes  Critical care time was exclusive of separately billable procedures and treating other patients.  Critical care was necessary to treat or prevent imminent or life-threatening deterioration.  Critical care was time spent personally by me on the following activities: development of treatment plan with patient and/or surrogate as well as nursing, discussions with consultants, evaluation of patient's response to treatment, examination of patient, obtaining history from patient or surrogate, ordering and performing treatments and interventions, ordering and review of laboratory studies, ordering and review of radiographic studies, pulse oximetry and re-evaluation of patient's condition.  Ezra Shuck 12/26/2023 8:25 AM

## 2023-12-27 DIAGNOSIS — I5023 Acute on chronic systolic (congestive) heart failure: Secondary | ICD-10-CM | POA: Diagnosis not present

## 2023-12-27 LAB — RENAL FUNCTION PANEL
Albumin: 2.8 g/dL — ABNORMAL LOW (ref 3.5–5.0)
Albumin: 2.6 g/dL — ABNORMAL LOW (ref 3.5–5.0)
Anion gap: 16 — ABNORMAL HIGH (ref 5–15)
Anion gap: 24 — ABNORMAL HIGH (ref 5–15)
BUN: 45 mg/dL — ABNORMAL HIGH (ref 6–20)
BUN: 43 mg/dL — ABNORMAL HIGH (ref 6–20)
CO2: 21 mmol/L — ABNORMAL LOW (ref 22–32)
CO2: 19 mmol/L — ABNORMAL LOW (ref 22–32)
Calcium: 7.9 mg/dL — ABNORMAL LOW (ref 8.9–10.3)
Calcium: 7.1 mg/dL — ABNORMAL LOW (ref 8.9–10.3)
Chloride: 97 mmol/L — ABNORMAL LOW (ref 98–111)
Chloride: 91 mmol/L — ABNORMAL LOW (ref 98–111)
Creatinine, Ser: 2.1 mg/dL — ABNORMAL HIGH (ref 0.61–1.24)
Creatinine, Ser: 2.17 mg/dL — ABNORMAL HIGH (ref 0.61–1.24)
GFR, Estimated: 36 mL/min — ABNORMAL LOW (ref >60)
GFR, Estimated: 34 mL/min — ABNORMAL LOW (ref >60)
Glucose, Bld: 190 mg/dL — ABNORMAL HIGH (ref 70–99)
Glucose, Bld: 177 mg/dL — ABNORMAL HIGH (ref 70–99)
Phosphorus: 3.6 mg/dL (ref 2.5–4.6)
Phosphorus: 3.1 mg/dL (ref 2.5–4.6)
Potassium: 4.3 mmol/L (ref 3.5–5.1)
Potassium: 3.8 mmol/L (ref 3.5–5.1)
Sodium: 134 mmol/L — ABNORMAL LOW (ref 135–145)
Sodium: 134 mmol/L — ABNORMAL LOW (ref 135–145)

## 2023-12-27 LAB — COOXEMETRY PANEL
Carboxyhemoglobin: 1.5 % (ref 0.5–1.5)
Carboxyhemoglobin: 0.7 % (ref 0.5–1.5)
Methemoglobin: 0.8 % (ref 0.0–1.5)
Methemoglobin: <0.7 % (ref 0.0–1.5)
O2 Saturation: 48.6 %
O2 Saturation: 45.5 %
Total hemoglobin: 17.2 g/dL — ABNORMAL HIGH (ref 12.0–16.0)
Total hemoglobin: 17.5 g/dL — ABNORMAL HIGH (ref 12.0–16.0)

## 2023-12-27 LAB — GLUCOSE, CAPILLARY
Glucose-Capillary: 160 mg/dL — ABNORMAL HIGH (ref 70–99)
Glucose-Capillary: 139 mg/dL — ABNORMAL HIGH (ref 70–99)
Glucose-Capillary: 125 mg/dL — ABNORMAL HIGH (ref 70–99)
Glucose-Capillary: 151 mg/dL — ABNORMAL HIGH (ref 70–99)
Glucose-Capillary: 145 mg/dL — ABNORMAL HIGH (ref 70–99)
Glucose-Capillary: 139 mg/dL — ABNORMAL HIGH (ref 70–99)

## 2023-12-27 LAB — CBC
HCT: 50.7 % (ref 39.0–52.0)
Hemoglobin: 16.8 g/dL (ref 13.0–17.0)
MCH: 28.9 pg (ref 26.0–34.0)
MCHC: 33.1 g/dL (ref 30.0–36.0)
MCV: 87.3 fL (ref 80.0–100.0)
Platelets: 94 K/uL — ABNORMAL LOW (ref 150–400)
RBC: 5.81 MIL/uL (ref 4.22–5.81)
RDW: 18.3 % — ABNORMAL HIGH (ref 11.5–15.5)
WBC: 25.3 K/uL — ABNORMAL HIGH (ref 4.0–10.5)
nRBC: 20.2 % — ABNORMAL HIGH (ref 0.0–0.2)

## 2023-12-27 LAB — LACTIC ACID, PLASMA: Lactic Acid, Venous: 2 mmol/L (ref 0.5–1.9)

## 2023-12-27 LAB — HEPARIN LEVEL (UNFRACTIONATED): Heparin Unfractionated: 0.37 [IU]/mL (ref 0.30–0.70)

## 2023-12-27 LAB — AMMONIA: Ammonia: 32 umol/L (ref 9–35)

## 2023-12-27 LAB — MAGNESIUM: Magnesium: 2.7 mg/dL — ABNORMAL HIGH (ref 1.7–2.4)

## 2023-12-27 MED ORDER — GERHARDT'S BUTT CREAM: TOPICAL_CREAM | CUTANEOUS | Status: DC | PRN

## 2023-12-27 MED ORDER — SODIUM CHLORIDE 0.9 % IV SOLN: INTRAVENOUS | Status: DC

## 2023-12-27 MED ORDER — LACTULOSE 10 GM/15ML PO SOLN: 10.0000 g | Freq: Two times a day (BID) | ORAL | Status: DC | PRN

## 2023-12-27 NOTE — Progress Notes (Addendum)
 Patient ID: Harold Park, male   DOB: 04-18-1965, 58 y.o.   MRN: [id number]     Advanced Heart Failure Rounding Note  Cardiologist: Wilbert Bihari, MD  Chief Complaint: A/C HF Subjective:   10/6 Started on Milirnone 0.125 for low output.  Developed VTwith shock x1. Sleeping at the time but described some chest burning. Lidocaine  added. K supplemented.  10/7 Cath - coronaries ok. Elevated filling pressures, CI 1.7. Milrinone  increased to 0.25 mcg. Started on pulse steroids for possible sarcoid 10/13: Milrinone  switched to DBA  Continues on high pressor/inotropic support, on DBA 7.5, Epi 7, NE 50 + VP 0.04.  Early am co-ox 48%.  MAP >65 this morning.  CVVH ongoing, I/Os net negative 1972 with weight down, pulling UF 100-150 cc/hr.  CVP still high at 20.   On Vanc and Zosyn, WBC 26>>27>>29>>25K. BCx remain negative.  Last PCT 0.22.   Rhythm stable in NSR. No VT on amiodarone  gtt.    On lactulose and rifaximin for hepatic encephalopathy, NH3 down to 32.  He is lethargic but will answer simple questions and follow commands.     Objective:   Weight Range: 131 kg Body mass index is 34.25 kg/m.   Vital Signs:   Temp:  [97.7 F (36.5 C)-98.7 F (37.1 C)] 98.6 F (37 C) (10/19 0311) Pulse Rate:  [90-176] 98 (10/19 0630) Resp:  [14-39] 31 (10/19 0700) SpO2:  [83 %-100 %] 98 % (10/19 0630) Arterial Line BP: (62-104)/(45-67) 84/52 (10/19 0700) Weight:  [131 kg] 131 kg (10/19 0500) Last BM Date : 12/26/23  Weight change: Filed Weights   12/25/23 0500 12/26/23 0630 12/27/23 0500  Weight: 134.9 kg 132.5 kg 131 kg    Intake/Output:   Intake/Output Summary (Last 24 hours) at 12/27/2023 0757 Last data filed at 12/27/2023 0700 Gross per 24 hour  Intake 5186.25 ml  Output 7157.8 ml  Net -1971.55 ml      Physical Exam   CVP 20 General: NAD Neck: JVP 16+, no thyromegaly or thyroid  nodule.  Lungs: Decreased at bases.  CV: Nondisplaced PMI.  Heart regular S1/S2, no S3/S4, no  murmur.  1+ ankle edema.  Abdomen: Soft, nontender, no hepatosplenomegaly, no distention.  Skin: Intact without lesions or rashes.  Neurologic: Lethargic but answers questions/follow commands.  Extremities: No clubbing or cyanosis.  HEENT: Normal.   Telemetry   ST 90s-100s, no VT  (Personally reviewed)    Labs    CBC Recent Labs    12/26/23 0418 12/27/23 0426  WBC 29.1* 25.3*  HGB 16.9 16.8  HCT 49.0 50.7  MCV 84.6 87.3  PLT 101* 94*   Basic Metabolic Panel Recent Labs    89/81/74 0418 12/26/23 1547 12/27/23 0426  NA 134* 133* 134*  K 4.3 4.4 4.3  CL 102 102 97*  CO2 19* 21* 21*  GLUCOSE 97 178* 190*  BUN 41* 45* 45*  CREATININE 2.00* 1.96* 2.10*  CALCIUM  8.3* 7.8* 7.9*  MG 2.8*  --  2.7*  PHOS 3.0 3.1 3.6   Liver Function Tests Recent Labs    12/26/23 1547 12/27/23 0426  ALBUMIN  2.8* 2.8*    Medications:     Scheduled Medications:  calcium  citrate  200 mg of elemental calcium  Per Tube Daily   Chlorhexidine  Gluconate Cloth  6 each Topical Daily   cholecalciferol  1,000 Units Per Tube Daily   dapsone  100 mg Per Tube Daily   feeding supplement (PROSource TF20)  60 mL Per Tube Q4H   insulin   aspart  0-20 Units Subcutaneous Q4H   insulin  glargine-yfgn  20 Units Subcutaneous QHS   lactulose  10 g Per Tube BID   melatonin  5 mg Per Tube QHS   mexiletine  150 mg Per Tube Q8H   multivitamin  1 tablet Per Tube QHS   mouth rinse  15 mL Mouth Rinse 4 times per day   pantoprazole (PROTONIX) IV  40 mg Intravenous Q24H   predniSONE   30 mg Per Tube Q breakfast   rifaximin  550 mg Per Tube BID   sodium chloride  flush  10-40 mL Intracatheter Q12H   sodium chloride  flush  10-40 mL Intracatheter Q12H   sodium chloride  flush  3 mL Intravenous Q12H    Infusions:  sodium chloride  20 mL/hr at 12/27/23 0700   amiodarone  30 mg/hr (12/27/23 0700)   DOBUTamine 7.5 mcg/kg/min (12/27/23 0700)   epinephrine 7 mcg/min (12/27/23 0700)   feeding supplement (VITAL 1.5 CAL)  20 mL/hr at 12/27/23 0700   heparin  1,700 Units/hr (12/27/23 0700)   norepinephrine  (LEVOPHED ) Adult infusion 50 mcg/min (12/27/23 0700)   piperacillin-tazobactam Stopped (12/27/23 0443)   prismasol BGK 4/2.5 400 mL/hr at 12/26/23 2219   prismasol BGK 4/2.5 400 mL/hr at 12/26/23 2219   prismasol BGK 4/2.5 1,500 mL/hr at 12/27/23 0442   vancomycin Stopped (12/26/23 1135)   vasopressin 0.04 Units/min (12/27/23 0700)    PRN Medications: acetaminophen  **OR** acetaminophen , heparin , heparin , mouth rinse, oxyCODONE , prochlorperazine, sodium chloride  flush, sodium chloride  flush, traZODone Patient Profile  58 year old with a history of HFrEF, PAF, OSA, VT, CKD Stage IIIa.   HF following for cardiogenic shock.  Assessment/Plan   A/C HFrEF, Cardiogenic Shock  - Normal coronary arteriography 12/2023 - cMRI 2/25 EF 28 % RV 33% LGE pattern concerning for sarcoid.   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  Cardiac PET in 6/23 did not show active inflammation.  He is now being treated empirically with prednisone  30 mg daily + methotrexate 5 mg weekly (methotrexate on hold as it cannot be crushed), ventricular rhythm improved.   - Appears to be doing better from a VT standpoint but c/w refractory cardiogenic/vasoplegic shock w/ multisystem organ failure. Gradually escalating pressors/inotropes with DBA 7.5, Epi 7, NE 50 + VP 0.04. Co-ox 48%, MAP > 65.   - Increase epinephrine to 8 today and repeat co-ox/check lactic acid.    - Marked volume overload with biventricular failure.  CVP still 20.  Ongoing CVVH, I/Os net negative -1972 with weight down.  Continue CVVH aiming for net negative 100-150 cc/hr today, tolerate MAP > 60. I do not think we can pull any more aggressively than this given low BP.  - With multisystem organ failure  (heart, liver, kidney) and delirium, his prognosis is increasingly poor.  He is not an LVAD candidate and or a transplant candidate at this point.  I would not place him on [place] [facility] as I do not think we have an end point for him.  Will continue current management with CVVH trying to pull volume with pressor support and broad spectrum antibiotics.  After discussion with family, he is now DNR/DNI.  We will continue goals of care discussions, his wife wants to continue current level of care for the time being. Will continue current support as above, if he further deteriorates will need discussion of comfort care.  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 - underwent DC-CV on 12/10/23 - Maintaining SR/ST. Continue IV amio. Of note he has previously had difficulty taking amiodarone  due to nausea.  - Continue heparin   - EP following. If turns around, can consider potential AFL ablation (versus AVN/pacer) + ICD.    VT - S/P DC-CV 10/4  - S/P DC- CV 10/6  - Continue amiodarone  drip  - Lidocaine  gtt stopped 10/9. Now on mexiletine.  - EP following.  - Has been electrically quiet since start of steroids  Possible Sarcoid - Consulted EP. - Now on prednisone  + methotrexate, methotrexate held though as it cannot be crushed.  - No further VT  AKI on CKD Stage IIIa  - Creatinine baseline ~ 1.6-1.8 - Oliguric, CRRT started 10/13 - CVP 20 today, ongoing CVVH as above. Appreciate nephrology assistance   OSA  -Continue CPAP  Hyponatremia  - Hypervolemic hyponatremia - S/p 3% Q8 several days + Tolvaptan 10/13 - Now on CVVH for management.   AMS Elevated LFTs - Suspect component of hepatic encephalopathy with delirium.  - Ammonia 52>51>35  - On lactulose and rifaximin, think we can continue rifaximin at this point and make lactulose prn constipation given regular BMs.    ID - Immunosuppressed with steroids.  - With refractory hypotension, was started on empiric vancomycin + Zosyn.  - Last PCT was  only 0.22.  - WBCs 29>25 but on steroids. Blood cultures NGTD.   Thrombocytopenia - Very slow trend down in platelets, discussed with pharmacy and think not likely HIT (due to inflammation/acute illness).      CRITICAL CARE Performed by: Ezra Shuck  Total critical care time: 45 minutes  Critical care time was exclusive of separately billable procedures and treating other patients.  Critical care was necessary to treat or prevent imminent or life-threatening deterioration.  Critical care was time spent personally by me on the following activities: development of treatment plan with patient and/or surrogate as well as nursing, discussions with consultants, evaluation of patient's response to treatment, examination of patient, obtaining history from patient or surrogate, ordering and performing treatments and interventions, ordering and review of laboratory studies, ordering and review of radiographic studies, pulse oximetry and re-evaluation of patient's condition.  Ezra Shuck 12/27/2023 7:57 AM

## 2023-12-27 NOTE — Progress Notes (Signed)
 PHARMACY - ANTICOAGULATION CONSULT NOTE  Pharmacy Consult for heparin  Indication: atrial fibrillation  Allergies  Allergen Reactions   Bee Venom Anaphylaxis    Patient Measurements: Height: 6' 5 (195.6 cm) Weight: 131 kg (288 lb 12.8 oz) (with footboard) IBW/kg (Calculated) : 89.1 HEPARIN  DW (KG): 119  Vital Signs: Temp: 98.6 F (37 C) (10/19 0311) Temp Source: Axillary (10/19 0311) Pulse Rate: 98 (10/19 0630)  Labs: Recent Labs    12/25/23 0449 12/25/23 1531 12/26/23 0418 12/26/23 1316 12/26/23 1547 12/26/23 2118 12/27/23 0426  HGB 16.7  --  16.9  --   --   --  16.8  HCT 49.0  --  49.0  --   --   --  50.7  PLT 118*  --  101*  --   --   --  94*  HEPARINUNFRC 0.30  --  0.22* 0.26*  --  0.40 0.37  CREATININE 1.83*   < > 2.00*  --  1.96*  --  2.10*   < > = values in this interval not displayed.    Estimated Creatinine Clearance: 56.7 mL/min (A) (by C-G formula based on SCr of 2.1 mg/dL (H)).   Medications:   Infusions:   sodium chloride  20 mL/hr at 12/27/23 0700   amiodarone  30 mg/hr (12/27/23 0700)   DOBUTamine 7.5 mcg/kg/min (12/27/23 0700)   epinephrine 7 mcg/min (12/27/23 0700)   feeding supplement (VITAL 1.5 CAL) 20 mL/hr at 12/27/23 0700   heparin  1,700 Units/hr (12/27/23 0700)   norepinephrine  (LEVOPHED ) Adult infusion 50 mcg/min (12/27/23 0700)   piperacillin-tazobactam Stopped (12/27/23 0443)   prismasol BGK 4/2.5 400 mL/hr at 12/26/23 2219   prismasol BGK 4/2.5 400 mL/hr at 12/26/23 2219   prismasol BGK 4/2.5 1,500 mL/hr at 12/27/23 0442   vancomycin Stopped (12/26/23 1135)   vasopressin 0.04 Units/min (12/27/23 0700)    Assessment: 58 YOM admitted with atrial fibrillation with rapid ventricular response. Patient underwent DCCV on 12/10/23 prior to this admission. Patient was shocked on 12/12/23 PM, now in NSR. Patient on Eliquis  prior to hospitalization, last dose on 12/11/23. Plan to start heparin  infusion prior to possible procedures. Pharmacy  consulted for heparin  dosing.   Heparin  level is therapeutic at 0.37 on 1700 units/hr.  No issues with infusion running or signs of bleeding per RN.  Hgb 16.8, pltc 94 continue to trend down.   Goal of Therapy:  Heparin  level 0.3-0.7 units/ml Monitor platelets by anticoagulation protocol: Yes   Plan:  Continue heparin  infusion 1700 units/hr  Monitor heparin  level, CBC, and s/sx of bleeding daily  Maurilio Fila, PharmD Clinical Pharmacist 12/27/2023  7:58 AM

## 2023-12-27 NOTE — Procedures (Signed)
 I was present at this session of CRRT. I have reviewed the session itself and made appropriate changes.   Vital signs in last 24 hours:  Temp:  [97.7 F (36.5 C)-98.7 F (37.1 C)] 98.6 F (37 C) (10/19 0311) Pulse Rate:  [91-176] 98 (10/19 0630) Resp:  [15-39] 31 (10/19 0700) SpO2:  [83 %-100 %] 98 % (10/19 0630) Arterial Line BP: (62-104)/(47-67) 84/52 (10/19 0700) Weight:  [131 kg] 131 kg (10/19 0500) Weight change: -1.5 kg Filed Weights   12/25/23 0500 12/26/23 0630 12/27/23 0500  Weight: 134.9 kg 132.5 kg 131 kg    Recent Labs  Lab 12/27/23 0426  NA 134*  K 4.3  CL 97*  CO2 21*  GLUCOSE 190*  BUN 45*  CREATININE 2.10*  CALCIUM  7.9*  PHOS 3.6    Recent Labs  Lab 12/25/23 0449 12/26/23 0418 12/27/23 0426  WBC 27.1* 29.1* 25.3*  HGB 16.7 16.9 16.8  HCT 49.0 49.0 50.7  MCV 84.0 84.6 87.3  PLT 118* 101* 94*    Scheduled Meds:  calcium  citrate  200 mg of elemental calcium  Per Tube Daily   Chlorhexidine  Gluconate Cloth  6 each Topical Daily   cholecalciferol  1,000 Units Per Tube Daily   dapsone  100 mg Per Tube Daily   feeding supplement (PROSource TF20)  60 mL Per Tube Q4H   insulin  aspart  0-20 Units Subcutaneous Q4H   insulin  glargine-yfgn  20 Units Subcutaneous QHS   melatonin  5 mg Per Tube QHS   mexiletine  150 mg Per Tube Q8H   multivitamin  1 tablet Per Tube QHS   mouth rinse  15 mL Mouth Rinse 4 times per day   pantoprazole (PROTONIX) IV  40 mg Intravenous Q24H   predniSONE   30 mg Per Tube Q breakfast   rifaximin  550 mg Per Tube BID   sodium chloride  flush  10-40 mL Intracatheter Q12H   sodium chloride  flush  10-40 mL Intracatheter Q12H   sodium chloride  flush  3 mL Intravenous Q12H   Continuous Infusions:  sodium chloride  20 mL/hr at 12/27/23 0700   amiodarone  30 mg/hr (12/27/23 0700)   DOBUTamine 7.5 mcg/kg/min (12/27/23 0700)   epinephrine 7 mcg/min (12/27/23 0700)   feeding supplement (VITAL 1.5 CAL) 20 mL/hr at 12/27/23 0700   heparin   1,700 Units/hr (12/27/23 0700)   norepinephrine  (LEVOPHED ) Adult infusion 50 mcg/min (12/27/23 0831)   piperacillin-tazobactam Stopped (12/27/23 0443)   prismasol BGK 4/2.5 400 mL/hr at 12/26/23 2219   prismasol BGK 4/2.5 400 mL/hr at 12/26/23 2219   prismasol BGK 4/2.5 1,500 mL/hr at 12/27/23 9192   vancomycin Stopped (12/26/23 1135)   vasopressin 0.04 Units/min (12/27/23 0700)   PRN Meds:.acetaminophen  **OR** acetaminophen , heparin , heparin , lactulose, mouth rinse, oxyCODONE , prochlorperazine, sodium chloride  flush, sodium chloride  flush, traZODone    Assessment/Plan:  AKI/CKD stage IIIa - in setting of biventricular failure and ongoing diuresis +/- IV contrast for heart cath on 12/15/23.  He is not responding well to maximal medical therapy, although his Na and Scr have slightly improved.  RIJ Temp HD cath placed 12/21/23 by PCCM and initiated CRRT to help manage his volume and hyponatremia. He was placed on a higher dose of epi gtt on 12/25/23, CVP remains 18- 20.  All fluids 4K/2.5Ca, no heparin  since he is on systemic heparin  for cardiac issues.  UF goal 50-150 mL/hr, but only able to set 100 mL/hr due to low MAP.  Will continue with current settings.  Off of lasix   drip.  Unfortunately having to increase pressor support for volume removal.   Avoid nephrotoxic medications including NSAIDs and iodinated intravenous contrast exposure unless the latter is absolutely indicated.   Preferred narcotic agents for pain control are hydromorphone, fentanyl , and methadone. Morphine should not be used.  Avoid Baclofen and avoid oral sodium phosphate  and magnesium citrate based laxatives / bowel preps.  Continue strict Input and Output monitoring. Will monitor the patient closely with you and intervene or adjust therapy as indicated by changes in clinical status/labs  Acute on chronic biventricular heart failure - normal cardiac cath on 12/15/23.  CVP remains elevated.  Currently, dobutamine, levophed ,  vasopressin, and epi.  UOP down from 1600 to 330 yesterday and 170 overnight.  Stopped lasix  drip on 12/23/23 with drop in UOP.  GDMT limited by hypotension.  Epi added to drips per advanced heart failure team. 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  Started on pulse steroids now on methotrexate and prednisone .  Plan per advanced heart failure team. Persistent atrial fibrillation - despite ablation and DCCV.  Currently on IV amiodarone .  EP following. H/o V tach - lidocaine  gtt stopped 12/17/23.  Now on mexiletine.  EP following. OSA - continue with CPAP Hypervolemic hyponatremia - also with AKI.  Improved with CRRT but stuck at 129. Disposition - prognosis is guarded as he has not really improved since starting CRRT.  Need to discuss goals of care with family.  Now DNR and would recommend transition to comfort measures if his clinical status continues to decline.  Harold DELENA Sellar,  MD 12/27/2023, 8:31 AM

## 2023-12-27 NOTE — Progress Notes (Signed)
 Inpatient Rehab Admissions Coordinator:  Consult received. Pt is currently not medically ready for CIR. Pt on CRRT. AC will sign off at this. CIR admissions team with follow from a distance to monitor for medical readiness. A consult order will be placed if pt appears to be an appropriate candidate.  Tinnie Yvone Cohens, MS, CCC-SLP Admissions Coordinator [phone]

## 2023-12-28 ENCOUNTER — Telehealth: Payer: Self-pay

## 2023-12-28 DIAGNOSIS — I5023 Acute on chronic systolic (congestive) heart failure: Secondary | ICD-10-CM | POA: Diagnosis not present

## 2023-12-28 LAB — CBC
HCT: 49.7 % (ref 39.0–52.0)
Hemoglobin: 16.4 g/dL (ref 13.0–17.0)
MCH: 28.6 pg (ref 26.0–34.0)
MCHC: 33 g/dL (ref 30.0–36.0)
MCV: 86.6 fL (ref 80.0–100.0)
Platelets: 86 K/uL — ABNORMAL LOW (ref 150–400)
RBC: 5.74 MIL/uL (ref 4.22–5.81)
RDW: 18.6 % — ABNORMAL HIGH (ref 11.5–15.5)
WBC: 26.7 K/uL — ABNORMAL HIGH (ref 4.0–10.5)
nRBC: 16.6 % — ABNORMAL HIGH (ref 0.0–0.2)

## 2023-12-28 LAB — RENAL FUNCTION PANEL
Albumin: 2.1 g/dL — ABNORMAL LOW (ref 3.5–5.0)
Albumin: 2.9 g/dL — ABNORMAL LOW (ref 3.5–5.0)
Anion gap: 43 — ABNORMAL HIGH (ref 5–15)
Anion gap: 15 (ref 5–15)
BUN: 41 mg/dL — ABNORMAL HIGH (ref 6–20)
BUN: 48 mg/dL — ABNORMAL HIGH (ref 6–20)
CO2: 16 mmol/L — ABNORMAL LOW (ref 22–32)
CO2: 19 mmol/L — ABNORMAL LOW (ref 22–32)
Calcium: 5.6 mg/dL — CL (ref 8.9–10.3)
Calcium: 7.7 mg/dL — ABNORMAL LOW (ref 8.9–10.3)
Chloride: 78 mmol/L — ABNORMAL LOW (ref 98–111)
Chloride: 96 mmol/L — ABNORMAL LOW (ref 98–111)
Creatinine, Ser: 2.48 mg/dL — ABNORMAL HIGH (ref 0.61–1.24)
Creatinine, Ser: 2.41 mg/dL — ABNORMAL HIGH (ref 0.61–1.24)
GFR, Estimated: 29 mL/min — ABNORMAL LOW (ref >60)
GFR, Estimated: 30 mL/min — ABNORMAL LOW (ref >60)
Glucose, Bld: 227 mg/dL — ABNORMAL HIGH (ref 70–99)
Glucose, Bld: 145 mg/dL — ABNORMAL HIGH (ref 70–99)
Phosphorus: 3 mg/dL (ref 2.5–4.6)
Phosphorus: 3.3 mg/dL (ref 2.5–4.6)
Potassium: 3.2 mmol/L — ABNORMAL LOW (ref 3.5–5.1)
Potassium: 4.5 mmol/L (ref 3.5–5.1)
Sodium: 137 mmol/L (ref 135–145)
Sodium: 130 mmol/L — ABNORMAL LOW (ref 135–145)

## 2023-12-28 LAB — GLUCOSE, CAPILLARY
Glucose-Capillary: 141 mg/dL — ABNORMAL HIGH (ref 70–99)
Glucose-Capillary: 129 mg/dL — ABNORMAL HIGH (ref 70–99)
Glucose-Capillary: 134 mg/dL — ABNORMAL HIGH (ref 70–99)
Glucose-Capillary: 114 mg/dL — ABNORMAL HIGH (ref 70–99)
Glucose-Capillary: 124 mg/dL — ABNORMAL HIGH (ref 70–99)
Glucose-Capillary: 171 mg/dL — ABNORMAL HIGH (ref 70–99)

## 2023-12-28 LAB — COOXEMETRY PANEL
Carboxyhemoglobin: 1.4 % (ref 0.5–1.5)
Methemoglobin: <0.7 % (ref 0.0–1.5)
O2 Saturation: 48.2 %
Total hemoglobin: 15.2 g/dL (ref 12.0–16.0)

## 2023-12-28 LAB — CULTURE, BLOOD (ROUTINE X 2)
Culture: NO GROWTH
Culture: NO GROWTH
Special Requests: ADEQUATE

## 2023-12-28 LAB — MAGNESIUM: Magnesium: 1.9 mg/dL (ref 1.7–2.4)

## 2023-12-28 LAB — HEPARIN LEVEL (UNFRACTIONATED)
Heparin Unfractionated: 0.28 [IU]/mL — ABNORMAL LOW (ref 0.30–0.70)
Heparin Unfractionated: <0.1 [IU]/mL — ABNORMAL LOW (ref 0.30–0.70)
Heparin Unfractionated: 0.28 [IU]/mL — ABNORMAL LOW (ref 0.30–0.70)

## 2023-12-28 LAB — LACTIC ACID, PLASMA: Lactic Acid, Venous: 2.3 mmol/L (ref 0.5–1.9)

## 2023-12-28 MED ORDER — POTASSIUM CHLORIDE 10 MEQ/50ML IV SOLN: 10.0000 meq | INTRAVENOUS | Status: DC

## 2023-12-28 MED ORDER — ENSURE PLUS HIGH PROTEIN PO LIQD
237.0000 mL | Freq: Three times a day (TID) | ORAL | Status: DC
  Administered 2023-12-28: 237 mL via ORAL

## 2023-12-28 MED ORDER — POTASSIUM CHLORIDE CRYS ER 20 MEQ PO TBCR
40.0000 meq | EXTENDED_RELEASE_TABLET | Freq: Once | ORAL | Status: AC
  Administered 2023-12-28: 40 meq via ORAL
  Filled 2023-12-28: qty 2

## 2023-12-28 MED ORDER — MAGNESIUM SULFATE 2 GM/50ML IV SOLN
2.0000 g | Freq: Once | INTRAVENOUS | Status: AC
  Administered 2023-12-28: 2 g via INTRAVENOUS
  Filled 2023-12-28: qty 50

## 2023-12-28 NOTE — Telephone Encounter (Signed)
 Procedure Cancelled per Dr. Nancey.SABRASABRA

## 2023-12-28 NOTE — Progress Notes (Addendum)
 Patient ID: Harold Park, male   DOB: 25-Aug-1965, 58 y.o.   MRN: [id number]     Advanced Heart Failure Rounding Note  Cardiologist: Wilbert Bihari, MD  Chief Complaint: A/C HF Subjective:   10/6 Started on Milirnone 0.125 for low output.  Developed VTwith shock x1. Sleeping at the time but described some chest burning. Lidocaine  added. K supplemented.  10/7 Cath - coronaries ok. Elevated filling pressures, CI 1.7. Milrinone  increased to 0.25 mcg. Started on pulse steroids for possible sarcoid 10/13: Milrinone  switched to DBA  Continues on high pressor/inotropic support, on DBA 7.5, Epi 8, NE 46 (down from 50) + VP 0.04.    Co-ox 48%, FICK CI 1.2,  MAP 71, SVR ~1100     CVVH ongoing, I/Os net negative 4L yesterday, pulling UF 120-150 cc/hr. CVP still elevated ~20   On Vanc and Zosyn, WBC 26>>27>>29>>25>>26K. BCx remain negative. Last PCT 0.22.   Had 1 7 beat run of NSVT overnight, otherwise stable.   On lactulose and rifaximin for hepatic encephalopathy. More alert/commutative today but remains confused.    Objective:   Weight Range: 127.4 kg Body mass index is 33.31 kg/m.   Vital Signs:   Temp:  [97.9 F (36.6 C)-98.7 F (37.1 C)] 98.6 F (37 C) (10/20 0326) Pulse Rate:  [91-109] 93 (10/20 0630) Resp:  [13-37] 28 (10/20 0630) SpO2:  [78 %-100 %] 98 % (10/20 0630) Arterial Line BP: (58-100)/(34-77) 98/57 (10/20 0630) Weight:  [127.4 kg] 127.4 kg (10/20 0500) Last BM Date : 12/27/23  Weight change: Filed Weights   12/26/23 0630 12/27/23 0500 12/28/23 0500  Weight: 132.5 kg 131 kg 127.4 kg    Intake/Output:   Intake/Output Summary (Last 24 hours) at 12/28/2023 0716 Last data filed at 12/28/2023 0700 Gross per 24 hour  Intake 6006.57 ml  Output 10059.6 ml  Net -4053.03 ml      Physical Exam   GENERAL: awake and alert, confused. NAD  Lungs- diminished at the bases  CARDIAC:  JVP to jaw          Normal rate with regular rhythm. No peripheral edema, LE cold   ABDOMEN: obese, soft, non-tender, non-distended.  EXTREMITIES: cold LE w/ dusty feet bilaterally  NEUROLOGIC: confused, intermittently follows commands    Telemetry   NSR 90s, 1 7 beat run of NSVT overnight (Personally reviewed)    Labs    CBC Recent Labs    12/26/23 0418 12/27/23 0426  WBC 29.1* 25.3*  HGB 16.9 16.8  HCT 49.0 50.7  MCV 84.6 87.3  PLT 101* 94*   Basic Metabolic Panel Recent Labs    89/80/74 0426 12/27/23 1636 12/28/23 0432  NA 134* 134*  --   K 4.3 3.8  --   CL 97* 91*  --   CO2 21* 19*  --   GLUCOSE 190* 177*  --   BUN 45* 43*  --   CREATININE 2.10* 2.17*  --   CALCIUM  7.9* 7.1*  --   MG 2.7*  --  1.9  PHOS 3.6 3.1  --    Liver Function Tests Recent Labs    12/27/23 0426 12/27/23 1636  ALBUMIN  2.8* 2.6*    Medications:     Scheduled Medications:  calcium  citrate  200 mg of elemental calcium  Per Tube Daily   Chlorhexidine  Gluconate Cloth  6 each Topical Daily   cholecalciferol  1,000 Units Per Tube Daily   dapsone  100 mg Per Tube Daily   feeding supplement (PROSource TF20)  60 mL Per Tube Q4H   insulin  aspart  0-20 Units Subcutaneous Q4H   insulin  glargine-yfgn  20 Units Subcutaneous QHS   melatonin  5 mg Per Tube QHS   mexiletine  150 mg Per Tube Q8H   multivitamin  1 tablet Per Tube QHS   mouth rinse  15 mL Mouth Rinse 4 times per day   pantoprazole (PROTONIX) IV  40 mg Intravenous Q24H   predniSONE   30 mg Per Tube Q breakfast   rifaximin  550 mg Per Tube BID   sodium chloride  flush  10-40 mL Intracatheter Q12H   sodium chloride  flush  10-40 mL Intracatheter Q12H   sodium chloride  flush  3 mL Intravenous Q12H    Infusions:  sodium chloride  20 mL/hr at 12/28/23 0700   amiodarone  30 mg/hr (12/28/23 0700)   DOBUTamine 7.5 mcg/kg/min (12/28/23 0700)   epinephrine 8 mcg/min (12/28/23 0700)   feeding supplement (VITAL 1.5 CAL) 20 mL/hr at 12/28/23 0700   heparin  1,700 Units/hr (12/28/23 0700)   magnesium sulfate bolus IVPB      norepinephrine  (LEVOPHED ) Adult infusion 46 mcg/min (12/28/23 0700)   piperacillin-tazobactam Stopped (12/28/23 0451)   prismasol BGK 4/2.5 400 mL/hr at 12/27/23 2347   prismasol BGK 4/2.5 400 mL/hr at 12/27/23 2347   prismasol BGK 4/2.5 1,500 mL/hr at 12/28/23 0106   vancomycin Stopped (12/27/23 1253)   vasopressin 0.04 Units/min (12/28/23 0700)    PRN Medications: acetaminophen  **OR** acetaminophen , Gerhardt's butt cream, heparin , heparin , lactulose, mouth rinse, oxyCODONE , prochlorperazine, sodium chloride  flush, sodium chloride  flush, traZODone Patient Profile  58 year old with a history of HFrEF, PAF, OSA, VT, CKD Stage IIIa.   HF following for cardiogenic shock.  Assessment/Plan   A/C HFrEF, Cardiogenic Shock  - Normal coronary arteriography 12/2023 - cMRI 2/25 EF 28 % RV 33% LGE pattern concerning for sarcoid.   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  Cardiac PET in 6/23 did not show active inflammation.  He is now being treated empirically with prednisone  30 mg daily + methotrexate 5 mg weekly (methotrexate on hold as it cannot be crushed), ventricular rhythm improved.   - Appears to be doing better from a VT standpoint but c/w refractory cardiogenic/vasoplegic shock w/ multisystem organ failure. Remains on DBA 7.5, Epi 8, VP 0.04 and NE at 46 (down from 50 day prior). Co-ox 48%, MAP > low 70s.   - Marked volume overload with biventricular failure.  CVP still 20.  Ongoing CVVH, I/Os net negative -4L with weight down.  Continue CVVH aiming for net negative 100-150 cc/hr today, tolerate MAP > 60. I do not think we can pull any more aggressively than this given low BP.  - With multisystem organ failure (heart, liver, kidney) and delirium, his prognosis is increasingly poor.  He is not an LVAD candidate and or a  transplant candidate at this point.  I would not place him on [place] [facility]O as I do not think we have an end point for him.  Will continue current management with CVVH trying to pull volume with pressor support and broad spectrum antibiotics.  After discussion with family, he is now DNR/DNI.  We will continue goals of care discussions, his wife wants to continue current level of care for the time being. Will continue current support as above, if he further deteriorates will need discussion of  comfort care.    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 - underwent DC-CV on 12/10/23 - Maintaining SR/ST. Continue IV amio. Of note he has previously had difficulty taking amiodarone  due to nausea.  - Continue heparin   - EP following. If turns around, can consider potential AFL ablation (versus AVN/pacer) + ICD.    VT - S/P DC-CV 10/4  - S/P DC- CV 10/6  - Continue amiodarone  drip  - Lidocaine  gtt stopped 10/9. Now on mexiletine.  - EP following.  - Has been electrically quiet since start of steroids  Possible Sarcoid - Consulted EP. - Now on prednisone  + methotrexate, methotrexate held though as it cannot be crushed.  - Brief NSVT overnight but no further sustained VT  AKI on CKD Stage IIIa  - Creatinine baseline ~ 1.6-1.8 - Oliguric, CRRT started 10/13 - CVP 20 today, ongoing CVVH as above. Appreciate nephrology assistance   OSA  -Continue CPAP  Hyponatremia  - Hypervolemic hyponatremia - S/p 3% Q8 several days + Tolvaptan 10/13 - Now on CVVH for management.   AMS Elevated LFTs - Suspect component of hepatic encephalopathy with delirium.  - Ammonia 52>51>35  - On lactulose and rifaximin, think we can continue rifaximin at this point and make lactulose prn constipation given regular BMs.    ID - Immunosuppressed with steroids.  - With refractory hypotension, was started on empiric vancomycin + Zosyn. Abx day #6 - Last PCT was only 0.22.  - WBCs 29>25>26K but on steroids. Blood cultures NGTD.    Thrombocytopenia - Very slow trend down in platelets, discussed with pharmacy and think not likely HIT (due to inflammation/acute illness).   - 86K today. H/H stable. No gross bleeding. Continue to monitor   CRITICAL CARE Performed by: Caffie Shed   Total critical care time: 15 minutes  Critical care time was exclusive of separately billable procedures and treating other patients.  Critical care was necessary to treat or prevent imminent or life-threatening deterioration.  Critical care was time spent personally by me on the following activities: development of treatment plan with patient and/or surrogate as well as nursing, discussions with consultants, evaluation of patient's response to treatment, examination of patient, obtaining history from patient or surrogate, ordering and performing treatments and interventions, ordering and review of laboratory studies, ordering and review of radiographic studies, pulse oximetry and re-evaluation of patient's condition.   Caffie Shed, PA-C  12/28/2023  Patient seen with PA, I formulated the plan and agree with the above note.   Co-ox 48% this morning, lactate 2.0 yesterday.  He remains on dobutamine 7.5, epinephrine 8, NE 46, vasopressin 0.04.  I/Os negative, pulling UF 100-150 cc/hr and weight down 8 lbs.  CVP down to 17 on my read this morning (was 22 for me yesterday).   WBCs 27, he is on vancomycin/Zosyn.   On amiodarone  gtt, had short NSVT run.   He is awake, will tell me his name but not much else. Will follow commands.   General: NAD Neck: JVP 16 cm, no thyromegaly or thyroid  nodule.  Lungs: Clear to auscultation bilaterally with normal respiratory effort. CV: Nondisplaced PMI.  Heart regular S1/S2, no S3/S4, no murmur.  1+ edema 1/2 to knees.     Abdomen: Soft, nontender, no hepatosplenomegaly, no distention.  Skin: Intact without lesions or rashes.  Neurologic: Awake, follows commands, not conversational.   Extremities: No clubbing or cyanosis.  HEENT: Normal.   He is alert but not conversational today. Suspect liver failure with elevated  NH3 and component of hepatic encephalopathy, last NH3 was lower.   - Continue rifaximin.   WBCs stable at 27K.  He is immunosuppressed with steroids and methotrexate.  We are covering empirically with Zosyn and vancomycin, complete 7 days (tomorrow).    Multiple episodes of VT initially, now on amiodarone  gtt 30 mg/hr + mexiletine.  Improved since started on steroids. Only intermittent PVCs and short NSVT run last night. He is in NSR.    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  Cardiac PET in 6/23 did not show active inflammation.  He is now being treated empirically with prednisone  30 mg daily + methotrexate 5 mg weekly (methotrexate now held as it cannot be crushed for NGT) ventricular rhythm improved. Will keep this going with empiric abx.    Cardiogenic shock with AKI.  Co-ox 48% this morning on dobutamine 7.5, epinephrine 8, NE 46, vasopressin 0.04. Pressor requirement fairly stable over the last day but co-ox still low. CVVH started 10/13 with intractable volume overload, AKI, and intractable hyponatremia.  He is 8 lbs down over the last day with CVVH.  CVP still elevated at 17 but was 22 yesterday.  - Repeat lactate.  - Continue CVVH UF net negative 100-150 cc/hr today.      With multisystem organ failure (heart, liver, kidney) and delirium, his prognosis is increasingly poor.  He is not an LVAD candidate and or a transplant candidate at this point.  I would not place him on [place] [facility] as I do not think we have an end point for him.  Will continue current management with CVVH trying slowly pull volume with pressors support and broad spectrum antibiotics.  After discussion with family, he is now DNR/DNI.  We will continue goals of  care discussions, his wife wants to continue current level of care for the time being.  If no improvement after volume status is optimized (has been markedly volume overloaded), will need to reassess.   CRITICAL CARE Performed by: Ezra Shuck  Total critical care time: 40 minutes  Critical care time was exclusive of separately billable procedures and treating other patients.  Critical care was necessary to treat or prevent imminent or life-threatening deterioration.  Critical care was time spent personally by me on the following activities: development of treatment plan with patient and/or surrogate as well as nursing, discussions with consultants, evaluation of patient's response to treatment, examination of patient, obtaining history from patient or surrogate, ordering and performing treatments and interventions, ordering and review of laboratory studies, ordering and review of radiographic studies, pulse oximetry and re-evaluation of patient's condition.  Ezra Shuck 12/28/2023 8:17 AM

## 2023-12-28 NOTE — Progress Notes (Signed)
 Nutrition Follow-up  DOCUMENTATION CODES:   Non-severe (moderate) malnutrition in context of chronic illness  INTERVENTION:   Recommend continuing trickle TF with addition of protein modulars; given improvements in mental status, pt starting to take some po, plan to add ONS and monitor.  Add Ensure Plus High Protein po TD, each supplement provides 350 kcal and 20 grams of protein  Continue Tube Feeding via Cortrak:  Vital 1.5 at 20 ml/hr Goal TF Vital 1.5 at 60 ml;hr with Pro-Source TF20 60 mL TID TF goal regimen provides 2400 kcals, 157 g of protein and 1094 mL of free water   Pro-Source TF20 60 mL q 4 hours while on trickle , each packet provides 20 grams of protein and 80 kcals.    Continue Renal MVI  NUTRITION DIAGNOSIS:   Moderate Malnutrition related to chronic illness as evidenced by moderate fat depletion, mild muscle depletion, moderate muscle depletion.  Being addressed  GOAL:   Patient will meet greater than or equal to 90% of their needs  Progressing  MONITOR:   PO intake, Supplement acceptance, TF tolerance, I & O's, Labs, Weight trends  REASON FOR ASSESSMENT:   Consult Assessment of nutrition requirement/status  ASSESSMENT:   58 yo male admitted with decompensated HF, A.fib with RVR and a flutter requiring multiple DCCV, +concern for cardiac sarcoidosis. Pt with +Cardiogenic shock, worsening AKI  requiring CRRT. PMH includes HTN, NICM, CHF, OSA, PAF  10/04 Admitted, Echo EF <15%, DC-CV 10/05 Started in milrinone  for low output; VT with shock x 1 10/07 Started on steroids for possible sarcoidosis 10/10 Levophed  initiated 10/13 CRRT initiated, RD initial assessment  Pt remains on CRRT, CVP 19-20. Anuric. 9L removed via CRRT in 24 hours; net negative 4L in 24 hours. Pulling 120-150 ml/hr currently  Remains on high pressor support Levophed  at 51 mcg/min Vasopressin 0.04 units/min Epinephrine at 8 mcg/min Dobutamine 7.5 mcg/kg/min MAPs (aline):  53-70 Diastolic Pressure (aline): 40s-50s  Pt more alert and communicative but remains confused.   Taking some PO per RN. Currently on FL diet. Plan to trial Ensure supplements  Vital 1.5 at 20 ml/hr continues via Cortrak with Pro-Source modular q 4 hours  +significant amount of stool in 24 hours, almost 900 mL in FMS with additional 5 stool occurrences. Noted lactulose now prn  Noted hypocalcemia, even when calcium  corrected for serum albumin . Pt with order for Calcium  Citrate daily, 2.5 Calcium  bath for CRRT. Question if pt may benefit from further calcium  supplementation but will need to discuss with team  Weight down to 127.4 kg (lowest wt this admission) Wt 131 kg yesterday Admission Wt: around 137 kg Highest wt: 149 kg  Labs: Corrected Calcium  7.1 (serum calcium  5.6, albumin  2.1) Phosphorus 3.0 (wdl) Magnesium 1.9 (wdl) Potassium 3.2 (L) Sodium 137 (wdl)  Meds: Calcium  Citrate Cholecalciferol SS Novolog  Semglee 20 units daily Lactulose now prn Rena-Vite daily IV protonix Prednisone  Xifaxan  Diet Order:   Diet Order             Diet full liquid Room service appropriate? Yes with Assist; Fluid consistency: Thin  Diet effective now                   EDUCATION NEEDS:   Not appropriate for education at this time  Skin:  Skin Assessment: Reviewed RN Assessment  Last BM:  10/20 via FMS  Height:   Ht Readings from Last 1 Encounters:  12/12/23 6' 5 (1.956 m)    Weight:   Wt Readings  from Last 1 Encounters:  12/28/23 127.4 kg      BMI:  Body mass index is 33.31 kg/m.  Estimated Nutritional Needs:   Kcal:  2300-2500 kcals  Protein:  140-170 g  Fluid:  1.8 L   Betsey Finger MS, RDN, LDN, CNSC Registered Dietitian 3 Clinical Nutrition RD Inpatient Contact Info in Amion

## 2023-12-28 NOTE — Progress Notes (Signed)
 PHARMACY - ANTICOAGULATION CONSULT NOTE  Pharmacy Consult for heparin  Indication: atrial fibrillation  Allergies  Allergen Reactions   Bee Venom Anaphylaxis    Patient Measurements: Height: 6' 5 (195.6 cm) Weight: 127.4 kg (280 lb 13.9 oz) (with footboard) IBW/kg (Calculated) : 89.1 HEPARIN  DW (KG): 119  Vital Signs: Temp: 98.6 F (37 C) (10/20 0326) Temp Source: Axillary (10/20 0326) Pulse Rate: 93 (10/20 0630)  Labs: Recent Labs    12/26/23 0418 12/26/23 1316 12/26/23 1547 12/26/23 2118 12/27/23 0426 12/27/23 1636 12/28/23 0432 12/28/23 0624  HGB 16.9  --   --   --  16.8  --   --  16.4  HCT 49.0  --   --   --  50.7  --   --  49.7  PLT 101*  --   --   --  94*  --   --  86*  HEPARINUNFRC 0.22*   < >  --  0.40 0.37  --  0.28*  --   CREATININE 2.00*  --  1.96*  --  2.10* 2.17*  --   --    < > = values in this interval not displayed.    Estimated Creatinine Clearance: 54.1 mL/min (A) (by C-G formula based on SCr of 2.17 mg/dL (H)).   Medications:   Infusions:   sodium chloride  20 mL/hr at 12/28/23 0700   amiodarone  30 mg/hr (12/28/23 0700)   DOBUTamine 7.5 mcg/kg/min (12/28/23 0700)   epinephrine 8 mcg/min (12/28/23 0700)   feeding supplement (VITAL 1.5 CAL) 20 mL/hr at 12/28/23 0700   heparin  1,700 Units/hr (12/28/23 0700)   magnesium sulfate bolus IVPB     norepinephrine  (LEVOPHED ) Adult infusion 46 mcg/min (12/28/23 0700)   piperacillin-tazobactam Stopped (12/28/23 0451)   prismasol BGK 4/2.5 400 mL/hr at 12/27/23 2347   prismasol BGK 4/2.5 400 mL/hr at 12/27/23 2347   prismasol BGK 4/2.5 1,500 mL/hr at 12/28/23 0106   vancomycin Stopped (12/27/23 1253)   vasopressin 0.04 Units/min (12/28/23 0700)    Assessment: 58 YOM admitted with atrial fibrillation with rapid ventricular response. Patient underwent DCCV on 12/10/23 prior to this admission. Patient was shocked on 12/12/23 PM, now in NSR. Patient on Eliquis  prior to hospitalization, last dose on  12/11/23. Plan to start heparin  infusion prior to possible procedures. Pharmacy consulted for heparin  dosing.   12/28/23 AM: Heparin  level 0.28, subtherapeutic at heparin  1700 units/hr. No issues with infusion running or signs of bleeding per RN. Hgb stable at 16.4, PLT decreased to 86.  Goal of Therapy:  Heparin  level 0.3-0.7 units/ml Monitor platelets by anticoagulation protocol: Yes   Plan:  Increase heparin  infusion 1800 units/hr  Heparin  level in 6 hours Monitor heparin  level, CBC, and s/sx of bleeding daily  Morna Breach, PharmD PGY2 Cardiology Pharmacy Resident 12/28/2023 7:29 AM

## 2023-12-28 NOTE — Progress Notes (Signed)
 [facility] Progress Note    Assessment/ Plan:    AKI/CKD stage IIIa - in setting of biventricular failure and ongoing diuresis +/- IV contrast for heart cath on 12/15/23.  Did not respond well to maximal medical therapy. RIJ Temp HD cath placed 12/21/23 by PCCM and initiated CRRT to help manage his volume and hyponatremia.  All fluids 4K/2.5Ca, no heparin  since he is on systemic heparin  for cardiac issues.  UF goal net neg 100-150 mL/hr, will continue with current settings.  Pressor support to optimize volume status, CVP slightly improved but still high Avoid nephrotoxic medications including NSAIDs and iodinated intravenous contrast exposure unless the latter is absolutely indicated.  Preferred narcotic agents for pain control are hydromorphone, fentanyl , and methadone. Morphine should not be used. Avoid Baclofen and avoid oral sodium phosphate  and magnesium citrate based laxatives / bowel preps. Continue strict Input and Output monitoring. Will monitor the patient closely with you and intervene or adjust therapy as indicated by changes in clinical status/labs  Acute on chronic biventricular heart failure - normal cardiac cath on 12/15/23.  CVP remains elevated.  Currently, dobutamine, levophed , vasopressin, and epi.  Off lasix  gtt given diminishing urine output  GDMT limited by hypotension.  Epi added to drips per advanced heart failure team. 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 Sp pulse steroids, now on methotrexate and prednisone .  Plan per advanced heart failure team. Persistent atrial fibrillation - despite ablation and DCCV.  Currently on IV amiodarone  and heparin  gtt.  EP following. H/o V tach - lidocaine  gtt stopped 12/17/23.  Now on mexiletine.  EP following. OSA - continue with CPAP Hypervolemic hyponatremia - also with AKI.  Improved with CRRT/UF. Thrombocytopenia: per primary service Disposition - prognosis is guarded as he has not really improved  since starting CRRT.  Now DNR. GOC discussions per primary service  Discussed with Dr. Rolan and ICU RN.  Subjective:   Patient seen and examined on CRRT. Currently running well on CRRT. CVP slightly better, down to 17. Uop ~85cc Pressors: dobut, epi, NE, vaso Net neg ~4L   Objective:   BP (!) 109/39   Pulse 95   Temp 97.6 F (36.4 C) (Oral)   Resp (!) 29   Ht 6' 5 (1.956 m)   Wt 127.4 kg Comment: with footboard  SpO2 96%   BMI 33.31 kg/m   Intake/Output Summary (Last 24 hours) at 12/28/2023 9147 Last data filed at 12/28/2023 0800 Gross per 24 hour  Intake 6075.17 ml  Output 89912 ml  Net -4011.83 ml   Weight change: -3.6 kg  Physical Exam: Gen: ill appearing, NAD, confused CVS: RRR Resp: diminished air entry bibasilar, no w/r/r/c appreciated Abd: obese, soft Ext: dusky toes, no sig edema Neuro: confused Dialysis access: RIJ temp HD catheter in use  Imaging: No results found.  Labs: BMET Recent Labs  Lab 12/25/23 0449 12/25/23 1531 12/26/23 0418 12/26/23 1547 12/27/23 0426 12/27/23 1636 12/28/23 0432  NA 130* 132* 134* 133* 134* 134* 137  K 4.3 4.5 4.3 4.4 4.3 3.8 3.2*  CL 102 102 102 102 97* 91* 78*  CO2 18* 18* 19* 21* 21* 19* 16*  GLUCOSE 250* 197* 97 178* 190* 177* 227*  BUN 38* 39* 41* 45* 45* 43* 41*  CREATININE 1.83* 1.91* 2.00* 1.96* 2.10* 2.17* 2.48*  CALCIUM  8.0* 7.9* 8.3* 7.8* 7.9* 7.1* 5.6*  PHOS 2.7 2.7 3.0 3.1 3.6 3.1 3.0   CBC Recent Labs  Lab 12/25/23 0449 12/26/23  9581 12/27/23 0426 12/28/23 0624  WBC 27.1* 29.1* 25.3* 26.7*  HGB 16.7 16.9 16.8 16.4  HCT 49.0 49.0 50.7 49.7  MCV 84.0 84.6 87.3 86.6  PLT 118* 101* 94* 86*    Medications:     calcium  citrate  200 mg of elemental calcium  Per Tube Daily   Chlorhexidine  Gluconate Cloth  6 each Topical Daily   cholecalciferol  1,000 Units Per Tube Daily   dapsone  100 mg Per Tube Daily   feeding supplement (PROSource TF20)  60 mL Per Tube Q4H   insulin  aspart  0-20 Units  Subcutaneous Q4H   insulin  glargine-yfgn  20 Units Subcutaneous QHS   melatonin  5 mg Per Tube QHS   mexiletine  150 mg Per Tube Q8H   multivitamin  1 tablet Per Tube QHS   mouth rinse  15 mL Mouth Rinse 4 times per day   pantoprazole (PROTONIX) IV  40 mg Intravenous Q24H   potassium chloride   40 mEq Oral Once   predniSONE   30 mg Per Tube Q breakfast   rifaximin  550 mg Per Tube BID   sodium chloride  flush  10-40 mL Intracatheter Q12H   sodium chloride  flush  10-40 mL Intracatheter Q12H   sodium chloride  flush  3 mL Intravenous Q12H      Ephriam Stank, MD [facility] 12/28/2023, 8:52 AM

## 2023-12-28 NOTE — Progress Notes (Addendum)
 PHARMACY - ANTICOAGULATION CONSULT NOTE  Pharmacy Consult for heparin  Indication: atrial fibrillation  Allergies  Allergen Reactions   Bee Venom Anaphylaxis    Patient Measurements: Height: 6' 5 (195.6 cm) Weight: 127.4 kg (280 lb 13.9 oz) (with footboard) IBW/kg (Calculated) : 89.1 HEPARIN  DW (KG): 119  Vital Signs: Temp: 98.6 F (37 C) (10/20 1551) Temp Source: Oral (10/20 1551) BP: 96/42 (10/20 1121) Pulse Rate: 96 (10/20 1615)  Labs: Recent Labs    12/26/23 0418 12/26/23 1316 12/27/23 0426 12/27/23 1636 12/28/23 0432 12/28/23 0624 12/28/23 1403 12/28/23 1533  HGB 16.9  --  16.8  --   --  16.4  --   --   HCT 49.0  --  50.7  --   --  49.7  --   --   PLT 101*  --  94*  --   --  86*  --   --   HEPARINUNFRC 0.22*   < > 0.37  --  0.28*  --  <0.10* 0.28*  CREATININE 2.00*   < > 2.10* 2.17* 2.48*  --   --  2.41*   < > = values in this interval not displayed.    Estimated Creatinine Clearance: 48.7 mL/min (A) (by C-G formula based on SCr of 2.41 mg/dL (H)).   Medications:   Infusions:   sodium chloride  20 mL/hr at 12/28/23 1602   amiodarone  30 mg/hr (12/28/23 1600)   DOBUTamine 7.5 mcg/kg/min (12/28/23 1600)   epinephrine 8 mcg/min (12/28/23 1600)   feeding supplement (VITAL 1.5 CAL) 20 mL/hr at 12/28/23 1600   heparin  1,800 Units/hr (12/28/23 1600)   norepinephrine  (LEVOPHED ) Adult infusion 51 mcg/min (12/28/23 1600)   piperacillin-tazobactam 3.375 g (12/28/23 1603)   prismasol BGK 4/2.5 400 mL/hr at 12/28/23 1219   prismasol BGK 4/2.5 400 mL/hr at 12/28/23 1219   prismasol BGK 4/2.5 1,500 mL/hr at 12/28/23 1437   vancomycin Stopped (12/28/23 1253)   vasopressin 0.04 Units/min (12/28/23 1600)    Assessment: 58 YOM admitted with atrial fibrillation with rapid ventricular response. Patient underwent DCCV on 12/10/23 prior to this admission. Patient was shocked on 12/12/23 PM, now in NSR. Patient on Eliquis  prior to hospitalization, last dose on 12/11/23. Plan  to start heparin  infusion prior to possible procedures. Pharmacy consulted for heparin  dosing.   12/28/23 AM: Heparin  level 0.28, subtherapeutic at heparin  1700 units/hr. No issues with infusion running or signs of bleeding per RN. Hgb stable at 16.4, PLT decreased to 86.  12/28/23 PM: Heparin  level 0.28 again. No issues with the infusion or bleeding reported.  Goal of Therapy:  Heparin  level 0.3-0.7 units/ml Monitor platelets by anticoagulation protocol: Yes   Plan:  Increase heparin  infusion 1950 units/hr  Heparin  level in 8 hours Monitor heparin  level, CBC, and s/sx of bleeding daily  Rocky Slade, PharmD, BCPS 12/28/2023 4:44 PM  Please check AMION for all [facility] Pharmacy phone numbers After 10:00 PM, call Main Pharmacy [phone]

## 2023-12-28 NOTE — Progress Notes (Signed)
 Speech Language Pathology Treatment: Dysphagia  Patient Details Name: Harold Park MRN: [id number] DOB: 1966/02/03 Today's Date: 12/28/2023 Time: 8497-8486 SLP Time Calculation (min) (ACUTE ONLY): 11 min  Assessment / Plan / Recommendation Clinical Impression  Pt seen for SLP session to assess diet tolerance. Per discussion with RN, pt consumed ~50% of full liquid diet without significant concerns for aspiration. She reported occasional cough with thin liquids, which appears to be associated to intermittent oral holding. RN reported pt has been more alert and responsive today, though had a session with PT earlier today and is now more lethargic.   Today, pt consumed ice chips, straw sips of water (5-6oz), and x1 bite of applesauce. Observed intermittent oral holding of liquids and applesauce with delayed oral transit; improved with verbal cues. Pharyngeal swallow initiation and laryngeal elevation observed. No overt or subtle s/s of aspiration observed.   Recommend continue full liquid/thin liquid diet at this time. SLP will follow up for ongoing clinical assessment to advance or modify diet as indicated.     HPI HPI: 58 y.o. male admitted 10/3 with SOB, weight gain. Found to have decompensated HF / cardiogenic shock, AFwRVR / AFL s/p DCCV 12/10/23. CRRT started 10/13. Pt had VT with DCCV on 10/4. Possible cardiac sarcoidosis. PMH: HFrEF, PAF, OSA, VT, CKD Stage IIIa.      SLP Plan  Continue with current plan of care          Recommendations  Diet recommendations: Thin liquid (full liquids) Liquids provided via: Straw Medication Administration: Via alternative means Supervision: Trained caregiver to feed patient Compensations: Minimize environmental distractions;Slow rate;Small sips/bites                  Oral care BID     Dysphagia, unspecified (R13.10)     Continue with current plan of care     Peyton JINNY Rummer  12/28/2023, 3:30 PM

## 2023-12-28 NOTE — Progress Notes (Signed)
 Physical Therapy Treatment Patient Details Name: Harold Park MRN: [id number] DOB: June 19, 1965 Today's Date: 12/28/2023   History of Present Illness 58 y.o. male admitted 10/3 with SOB, weight gain. Found to have decompensated HF / cardiogenic shock, AFwRVR / AFL s/p DCCV 12/10/23. CRRT started 10/13. Pt had VT with DCCV on 10/4. Possible cardiac sarcoidosis. PMH: HFrEF, PAF, OSA, VT, CKD Stage IIIa.    PT Comments  Still lethargic but following simple motor commands well today, he just needs frequent engagement and multimodal cues. Tolerated LE exercises, sat upright with bed in egress mode, able to pull himself forward and sit without back support using rails on bed for up to 45 seconds at CGA level. Developed dizziness with MAP dropping to mid 50s on a-line. MAP recovers to 60 with back supported again and prolonged rest period but LEs needed elevated to return to 65 and greater. Deferred standing for this reason. Encouraged spouse to engage pt for ankle pumps periodically throughout the day. Patient will continue to benefit from skilled physical therapy services to further improve independence with functional mobility.     If plan is discharge home, recommend the following: Two people to help with walking and/or transfers;Two people to help with bathing/dressing/bathroom;Assistance with cooking/housework;Direct supervision/assist for medications management;Direct supervision/assist for financial management;Assist for transportation;Help with stairs or ramp for entrance;Supervision due to cognitive status   Can travel by private vehicle        Equipment Recommendations  Other (comment) (TBD - limited assessment)    Recommendations for Other Services Rehab consult     Precautions / Restrictions Precautions Precautions: Fall Recall of Precautions/Restrictions: Impaired Restrictions Weight Bearing Restrictions Per Provider Order: No     Mobility  Bed Mobility Overal bed mobility:  Needs Assistance             General bed mobility comments: Due to lethargy and low MAP, egress mode utilized on bed. He was able to pull himself up into an unsupported seated position with LEs dependent and touching the floor. Tolerated approx 45 seconds but had to lie back due to dizziness - MAP decreased from mid 60s to mid/upper 50s. Could not tolerate a second attempt with MAP only improving to 60 after a period of rest.    Transfers                   General transfer comment: Deferred due to symptomatic hypotension while sitting up.    Ambulation/Gait                   Stairs             Wheelchair Mobility     Tilt Bed    Modified Rankin (Stroke Patients Only)       Balance Overall balance assessment: Needs assistance Sitting-balance support: Feet supported, Bilateral upper extremity supported Sitting balance-Leahy Scale: Poor Sitting balance - Comments: Holding rails to sit without back support with bed elevated into egress.                                    Communication Communication Communication: Impaired Factors Affecting Communication: Difficulty expressing self;Reduced clarity of speech  Cognition Arousal: Lethargic Behavior During Therapy: Flat affect   PT - Cognitive impairments: Attention, Initiation, Sequencing, Problem solving, Difficult to assess, Safety/Judgement Difficult to assess due to: Level of arousal  PT - Cognition Comments: Limited by level of arousal but was answering simple questions intermittently Following commands: Impaired Following commands impaired: Follows one step commands with increased time, Follows one step commands inconsistently    Cueing Cueing Techniques: Verbal cues, Gestural cues, Tactile cues, Visual cues  Exercises General Exercises - Lower Extremity Ankle Circles/Pumps: AAROM, Both, Seated, 15 reps, Supine Quad Sets: Strengthening, Both, 5 reps,  Supine Short Arc Quad: Strengthening, AAROM, Both, 10 reps, Seated Long Arc Quad: Strengthening, Both, 10 reps, Seated, AAROM Hip Flexion/Marching: Strengthening, AAROM, Both, 5 reps, Seated    General Comments General comments (skin integrity, edema, etc.): MAP mid 60s prior to activity, dropped to 56 at nadir while pt holding himself up unsupported from behind using rails for about 45 seconds. End of session with LEs elevated MAP up to 69.  HR 94, SpO2 92% on 3L.      Pertinent Vitals/Pain Pain Assessment Pain Assessment: CPOT Facial Expression: Relaxed, neutral Body Movements: Absence of movements Muscle Tension: Relaxed Compliance with ventilator (intubated pts.): N/A Vocalization (extubated pts.): Talking in normal tone or no sound CPOT Total: 0    Home Living                          Prior Function            PT Goals (current goals can now be found in the care plan section) Acute Rehab PT Goals Patient Stated Goal: none stated PT Goal Formulation: Patient unable to participate in goal setting Time For Goal Achievement: 01/05/24 Potential to Achieve Goals: Fair Progress towards PT goals: Progressing toward goals    Frequency    Min 2X/week      PT Plan      Co-evaluation              AM-PAC PT 6 Clicks Mobility   Outcome Measure  Help needed turning from your back to your side while in a flat bed without using bedrails?: A Little Help needed moving from lying on your back to sitting on the side of a flat bed without using bedrails?: A Lot Help needed moving to and from a bed to a chair (including a wheelchair)?: Total Help needed standing up from a chair using your arms (e.g., wheelchair or bedside chair)?: Total Help needed to walk in hospital room?: Total Help needed climbing 3-5 steps with a railing? : Total 6 Click Score: 9    End of Session Equipment Utilized During Treatment: Oxygen Activity Tolerance: Patient limited by  lethargy;Patient limited by fatigue;Treatment limited secondary to medical complications (Comment) (hypotensive) Patient left: in bed;with call bell/phone within reach;with nursing/sitter in room;with family/visitor present;with bed alarm set Nurse Communication: Mobility status;Need for lift equipment PT Visit Diagnosis: Unsteadiness on feet (R26.81);Other abnormalities of gait and mobility (R26.89);Muscle weakness (generalized) (M62.81);Difficulty in walking, not elsewhere classified (R26.2);Other symptoms and signs involving the nervous system (R29.898)     Time: 1030-1049 PT Time Calculation (min) (ACUTE ONLY): 19 min  Charges:    $Therapeutic Exercise: 8-22 mins PT General Charges $$ ACUTE PT VISIT: 1 Visit                     Leontine Roads, PT, DPT [facility]  Rehabilitation Services Physical Therapist Office: [phone] Website: [email or web address]    Leontine GORMAN Roads 12/28/2023, 11:22 AM

## 2023-12-28 NOTE — Telephone Encounter (Signed)
-----   Message from Nurse Aldona R sent at 12/03/2023  4:51 PM EDT ----- Regarding: 11/10 AFib [name] Important: list procedure date as first item in subject line, followed by procedure type (e.g., 11/20/23 AFib ablation)  Precert:  MD: [name] Type of ablation: A-Flutter Diagnosis: Atypical AFlutter CPT code: A-flutter (06346) Ablation scheduled (date/time): 11/10 730am  Procedure:  Added to calendar? Yes Orders entered? No, >30 days before procedure Letter complete? No, >30 days before procedure Scheduled with cath lab? Yes Any medications to hold? Routine  Labs ordered (CBC, BMET, PT/INR if on warfarin): Yes  Mapping system: CARTO (lab 4 or 6) CARTO/OPAL rep notified? Yes Cardiac CT needed? No Dye allergy? N/A Pre-meds ordered and instructions given? N/A Letter method: MyChart H&P: 9/24 Device: No  Follow-up:  Cassie/Angel, please schedule Routine.  Covering RN - please send this message to [employer], EP scheduler, EP Scheduling pool, EP [employer], and CT scheduler ([place] Lynch/Stephanie Mogg), if indicated.

## 2023-12-29 DIAGNOSIS — Z7189 Other specified counseling: Secondary | ICD-10-CM | POA: Diagnosis not present

## 2023-12-29 DIAGNOSIS — I5082 Biventricular heart failure: Secondary | ICD-10-CM | POA: Diagnosis not present

## 2023-12-29 DIAGNOSIS — I5023 Acute on chronic systolic (congestive) heart failure: Secondary | ICD-10-CM | POA: Diagnosis not present

## 2023-12-29 DIAGNOSIS — Z515 Encounter for palliative care: Secondary | ICD-10-CM

## 2023-12-29 LAB — RENAL FUNCTION PANEL
Albumin: 2.1 g/dL — ABNORMAL LOW (ref 3.5–5.0)
Albumin: 2.4 g/dL — ABNORMAL LOW (ref 3.5–5.0)
Anion gap: 14 (ref 5–15)
Anion gap: 13 (ref 5–15)
BUN: 47 mg/dL — ABNORMAL HIGH (ref 6–20)
BUN: 53 mg/dL — ABNORMAL HIGH (ref 6–20)
CO2: 18 mmol/L — ABNORMAL LOW (ref 22–32)
CO2: 14 mmol/L — ABNORMAL LOW (ref 22–32)
Calcium: 6.4 mg/dL — CL (ref 8.9–10.3)
Calcium: 7.6 mg/dL — ABNORMAL LOW (ref 8.9–10.3)
Chloride: 102 mmol/L (ref 98–111)
Chloride: 100 mmol/L (ref 98–111)
Creatinine, Ser: 2.08 mg/dL — ABNORMAL HIGH (ref 0.61–1.24)
Creatinine, Ser: 2.59 mg/dL — ABNORMAL HIGH (ref 0.61–1.24)
GFR, Estimated: 36 mL/min — ABNORMAL LOW (ref >60)
GFR, Estimated: 28 mL/min — ABNORMAL LOW (ref >60)
Glucose, Bld: 198 mg/dL — ABNORMAL HIGH (ref 70–99)
Glucose, Bld: 111 mg/dL — ABNORMAL HIGH (ref 70–99)
Phosphorus: 2.6 mg/dL (ref 2.5–4.6)
Phosphorus: 3.7 mg/dL (ref 2.5–4.6)
Potassium: 3.6 mmol/L (ref 3.5–5.1)
Potassium: 4.5 mmol/L (ref 3.5–5.1)
Sodium: 134 mmol/L — ABNORMAL LOW (ref 135–145)
Sodium: 127 mmol/L — ABNORMAL LOW (ref 135–145)

## 2023-12-29 LAB — GLUCOSE, CAPILLARY
Glucose-Capillary: 164 mg/dL — ABNORMAL HIGH (ref 70–99)
Glucose-Capillary: 158 mg/dL — ABNORMAL HIGH (ref 70–99)
Glucose-Capillary: 159 mg/dL — ABNORMAL HIGH (ref 70–99)
Glucose-Capillary: 95 mg/dL (ref 70–99)

## 2023-12-29 LAB — COOXEMETRY PANEL
Carboxyhemoglobin: 1.1 % (ref 0.5–1.5)
Methemoglobin: <0.7 % (ref 0.0–1.5)
O2 Saturation: 46 %
Total hemoglobin: 15.1 g/dL (ref 12.0–16.0)

## 2023-12-29 LAB — CBC
HCT: 43.8 % (ref 39.0–52.0)
Hemoglobin: 14.7 g/dL (ref 13.0–17.0)
MCH: 29.1 pg (ref 26.0–34.0)
MCHC: 33.6 g/dL (ref 30.0–36.0)
MCV: 86.6 fL (ref 80.0–100.0)
Platelets: UNDETERMINED K/uL (ref 150–400)
RBC: 5.06 MIL/uL (ref 4.22–5.81)
RDW: 17.9 % — ABNORMAL HIGH (ref 11.5–15.5)
WBC: 23.9 K/uL — ABNORMAL HIGH (ref 4.0–10.5)
nRBC: 12.5 % — ABNORMAL HIGH (ref 0.0–0.2)

## 2023-12-29 LAB — POCT I-STAT 7, (LYTES, BLD GAS, ICA,H+H)
Acid-base deficit: 6 mmol/L — ABNORMAL HIGH (ref 0.0–2.0)
Bicarbonate: 15.5 mmol/L — ABNORMAL LOW (ref 20.0–28.0)
Calcium, Ion: 1.1 mmol/L — ABNORMAL LOW (ref 1.15–1.40)
HCT: 54 % — ABNORMAL HIGH (ref 39.0–52.0)
Hemoglobin: 18.4 g/dL — ABNORMAL HIGH (ref 13.0–17.0)
O2 Saturation: 99 %
Patient temperature: 97.8
Potassium: 4.4 mmol/L (ref 3.5–5.1)
Sodium: 131 mmol/L — ABNORMAL LOW (ref 135–145)
TCO2: 16 mmol/L — ABNORMAL LOW (ref 22–32)
pCO2 arterial: 23.3 mmHg — ABNORMAL LOW (ref 32–48)
pH, Arterial: 7.429 (ref 7.35–7.45)
pO2, Arterial: 125 mmHg — ABNORMAL HIGH (ref 83–108)

## 2023-12-29 LAB — MAGNESIUM: Magnesium: 2.4 mg/dL (ref 1.7–2.4)

## 2023-12-29 LAB — VANCOMYCIN, TROUGH: Vancomycin Tr: 18 ug/mL (ref 15–20)

## 2023-12-29 LAB — HEPARIN LEVEL (UNFRACTIONATED)
Heparin Unfractionated: 0.44 [IU]/mL (ref 0.30–0.70)
Heparin Unfractionated: 0.19 [IU]/mL — ABNORMAL LOW (ref 0.30–0.70)

## 2023-12-29 MED ORDER — FENTANYL 2500MCG IN NS 250ML (10MCG/ML) PREMIX INFUSION
0.0000 ug/h | INTRAVENOUS | Status: DC
  Administered 2023-12-29: 50 ug/h via INTRAVENOUS
  Filled 2023-12-29: qty 250

## 2023-12-29 MED ORDER — VANCOMYCIN HCL 1500 MG/300ML IV SOLN
1500.0000 mg | INTRAVENOUS | Status: DC
Start: 2023-12-29 — End: 2023-12-30
  Administered 2023-12-29: 1500 mg via INTRAVENOUS
  Filled 2023-12-29 (×2): qty 300

## 2023-12-29 MED ORDER — GLYCOPYRROLATE 1 MG PO TABS: 1.0000 mg | ORAL_TABLET | ORAL | Status: DC | PRN

## 2023-12-29 MED ORDER — METHYLENE BLUE (ANTIDOTE) 1 % IV SOLN
120.0000 mg | Freq: Once | INTRAVENOUS | Status: DC
  Filled 2023-12-29: qty 12

## 2023-12-29 MED ORDER — LORAZEPAM 2 MG/ML IJ SOLN
2.0000 mg | INTRAMUSCULAR | Status: DC | PRN
  Administered 2023-12-29: 2 mg via INTRAVENOUS
  Filled 2023-12-29: qty 1

## 2023-12-29 MED ORDER — GLYCOPYRROLATE 0.2 MG/ML IJ SOLN: 0.2000 mg | INTRAMUSCULAR | Status: DC | PRN

## 2023-12-29 MED ORDER — FENTANYL BOLUS VIA INFUSION
100.0000 ug | INTRAVENOUS | Status: DC | PRN
  Administered 2023-12-29: 100 ug via INTRAVENOUS

## 2023-12-29 MED ORDER — PIPERACILLIN-TAZOBACTAM 3.375 G IVPB 30 MIN
3.3750 g | Freq: Four times a day (QID) | INTRAVENOUS | Status: DC
  Administered 2023-12-29 (×2): 3.375 g via INTRAVENOUS
  Filled 2023-12-29 (×2): qty 50

## 2023-12-29 MED ORDER — SODIUM CHLORIDE 0.9 % IV SOLN
10.0000 ug/min | INTRAVENOUS | Status: DC
  Filled 2023-12-29: qty 10

## 2023-12-29 MED ORDER — HALOPERIDOL LACTATE 5 MG/ML IJ SOLN: 2.5000 mg | INTRAMUSCULAR | Status: DC | PRN

## 2023-12-29 MED ORDER — POTASSIUM PHOSPHATES 15 MMOLE/5ML IV SOLN
15.0000 mmol | Freq: Once | INTRAVENOUS | Status: AC
  Administered 2023-12-29: 15 mmol via INTRAVENOUS
  Filled 2023-12-29: qty 5

## 2023-12-29 MED ORDER — CALCIUM GLUCONATE-NACL 1-0.675 GM/50ML-% IV SOLN
1.0000 g | Freq: Once | INTRAVENOUS | Status: DC
  Filled 2023-12-29: qty 50

## 2023-12-29 MED ORDER — METHYLENE BLUE (ANTIDOTE) 1 % IV SOLN
120.0000 mg | Freq: Once | INTRAVENOUS | Status: AC
  Administered 2023-12-29: 120 mg via INTRAVENOUS
  Filled 2023-12-29: qty 12

## 2023-12-29 MED ORDER — SODIUM CHLORIDE 0.9 % IV SOLN: INTRAVENOUS | Status: DC

## 2023-12-29 MED ORDER — SODIUM CHLORIDE 0.9 % IV SOLN
10.0000 ug/min | INTRAVENOUS | Status: DC
  Administered 2023-12-29: 10 ug/min via INTRAVENOUS
  Filled 2023-12-29: qty 10

## 2023-12-29 MED ORDER — POLYVINYL ALCOHOL 1.4 % OP SOLN: 1.0000 [drp] | Freq: Four times a day (QID) | OPHTHALMIC | Status: DC | PRN

## 2023-12-29 MED ORDER — GLYCOPYRROLATE 0.2 MG/ML IJ SOLN
0.2000 mg | INTRAMUSCULAR | Status: DC | PRN
  Administered 2023-12-29: 0.2 mg via INTRAVENOUS
  Filled 2023-12-29: qty 1

## 2023-12-29 MED ORDER — CALCIUM GLUCONATE-NACL 1-0.675 GM/50ML-% IV SOLN
1.0000 g | Freq: Once | INTRAVENOUS | Status: AC
  Administered 2023-12-29: 1000 mg via INTRAVENOUS
  Filled 2023-12-29: qty 50

## 2023-12-29 NOTE — Progress Notes (Addendum)
 Patient ID: Harold Park, male   DOB: [birth year], 58 y.o.   MRN: [id number]     Advanced Heart Failure Rounding Note  Cardiologist: Wilbert Bihari, MD  Chief Complaint: A/C HF Subjective:   10/6 Started on Milirnone 0.125 for low output.  Developed VTwith shock x1. Sleeping at the time but described some chest burning. Lidocaine  added. K supplemented.  10/7 Cath - coronaries ok. Elevated filling pressures, CI 1.7. Milrinone  increased to 0.25 mcg. Started on pulse steroids for possible sarcoid 10/13: Milrinone  switched to DBA  Remains on high support w/ increased pressor requirements overnight. CRRT fluid removal paused. Currently on NE 60, Epi 8, VP 0.04 + DBA 7.5. MAPs low 50s. Co-ox 46%, CVP 11 on my check. CRRT currently running positive.    Grimaces to painful stimuli but not currently following commands.  No further VT.    Remains on Vanc + Zosyn. WBCs downtrending (also getting steroids)   Objective:   Weight Range: 126.7 kg Body mass index is 33.12 kg/m.   Vital Signs:   Temp:  [97.5 F (36.4 C)-98.6 F (37 C)] 98.2 F (36.8 C) (10/21 0307) Pulse Rate:  [87-98] 91 (10/21 0700) Resp:  [16-38] 31 (10/21 0700) BP: (96)/(42) 96/42 (10/20 1121) SpO2:  [87 %-100 %] 91 % (10/21 0700) Arterial Line BP: (68-96)/(38-71) 79/46 (10/21 0700) Weight:  [126.7 kg] 126.7 kg (10/21 0500) Last BM Date : 12/28/23  Weight change: Filed Weights   12/27/23 0500 12/28/23 0500 12/29/23 0500  Weight: 131 kg 127.4 kg 126.7 kg    Intake/Output:   Intake/Output Summary (Last 24 hours) at 12/29/2023 0741 Last data filed at 12/29/2023 0700 Gross per 24 hour  Intake 6352.61 ml  Output 7681.7 ml  Net -1329.09 ml      Physical Exam   CVP 11  GENERAL: critically ill, grimaces to painful stimuli but not following commands. No respiratory difficulty  Lungs- clear  CARDIAC:  JVP not well visualized           Normal rate with regular rhythm. No MRG. Trace RLE pretibial edema ABDOMEN:  Soft, non-tender, non-distended.  EXTREMITIES: trace pretibial edem, distal extremities cold and dusty  NEUROLOGIC: grimaces to painful stimuli not following commands    Telemetry   NSR 80s. No VT  (Personally reviewed)    Labs    CBC Recent Labs    12/28/23 0624 12/29/23 0351  WBC 26.7* 23.9*  HGB 16.4 14.7  HCT 49.7 43.8  MCV 86.6 86.6  PLT 86* PLATELET CLUMPS NOTED ON SMEAR, UNABLE TO ESTIMATE   Basic Metabolic Panel Recent Labs    89/79/74 0432 12/28/23 1533 12/29/23 0351  NA 137 130* 134*  K 3.2* 4.5 3.6  CL 78* 96* 102  CO2 16* 19* 18*  GLUCOSE 227* 145* 198*  BUN 41* 48* 47*  CREATININE 2.48* 2.41* 2.08*  CALCIUM  5.6* 7.7* 6.4*  MG 1.9  --  2.4  PHOS 3.0 3.3 2.6   Liver Function Tests Recent Labs    12/28/23 1533 12/29/23 0351  ALBUMIN  2.9* 2.1*    Medications:     Scheduled Medications:  calcium  citrate  200 mg of elemental calcium  Per Tube Daily   Chlorhexidine  Gluconate Cloth  6 each Topical Daily   cholecalciferol  1,000 Units Per Tube Daily   dapsone  100 mg Per Tube Daily   feeding supplement  237 mL Oral TID BM   feeding supplement (PROSource TF20)  60 mL Per Tube Q4H   insulin  aspart  0-20 Units Subcutaneous Q4H   insulin  glargine-yfgn  20 Units Subcutaneous QHS   melatonin  5 mg Per Tube QHS   mexiletine  150 mg Per Tube Q8H   multivitamin  1 tablet Per Tube QHS   mouth rinse  15 mL Mouth Rinse 4 times per day   pantoprazole (PROTONIX) IV  40 mg Intravenous Q24H   predniSONE   30 mg Per Tube Q breakfast   rifaximin  550 mg Per Tube BID   sodium chloride  flush  10-40 mL Intracatheter Q12H   sodium chloride  flush  10-40 mL Intracatheter Q12H   sodium chloride  flush  3 mL Intravenous Q12H    Infusions:  sodium chloride  20 mL/hr at 12/29/23 0700   amiodarone  30 mg/hr (12/29/23 0700)   DOBUTamine 7.5 mcg/kg/min (12/29/23 0700)   epinephrine 8 mcg/min (12/29/23 0700)   feeding supplement (VITAL 1.5 CAL) 20 mL/hr at 12/29/23 0700    heparin  1,950 Units/hr (12/29/23 0700)   norepinephrine  (LEVOPHED ) Adult infusion 60 mcg/min (12/29/23 0700)   piperacillin-tazobactam Stopped (12/29/23 0410)   potassium PHOSPHATE IVPB (in mmol)     prismasol BGK 4/2.5 400 mL/hr at 12/28/23 2320   prismasol BGK 4/2.5 400 mL/hr at 12/28/23 2320   prismasol BGK 4/2.5 1,500 mL/hr at 12/29/23 0604   vancomycin Stopped (12/28/23 1253)   vasopressin 0.04 Units/min (12/29/23 0700)    PRN Medications: acetaminophen  **OR** acetaminophen , Gerhardt's butt cream, heparin , heparin , lactulose, mouth rinse, oxyCODONE , prochlorperazine, sodium chloride  flush, sodium chloride  flush, traZODone Patient Profile  58 year old with a history of HFrEF, PAF, OSA, VT, CKD Stage IIIa.   HF following for cardiogenic shock.  Assessment/Plan   A/C HFrEF, Cardiogenic Shock  - Normal coronary arteriography 12/2023 - cMRI 2/25 EF 28 % RV 33% LGE pattern concerning for sarcoid.   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  Cardiac PET in 6/23 did not show active inflammation.  He is now being treated empirically with prednisone  30 mg daily + methotrexate 5 mg weekly (methotrexate on hold as it cannot be crushed), ventricular rhythm improved.   - Appears to be doing better from a VT standpoint but c/w refractory cardiogenic/vasoplegic shock w/ multisystem organ failure. Remains on DBA 7.5, Epi 8, VP 0.04 and NE at 60 (up from 46 prior). MAPs now in the low 50s. Co-ox 46%. CVP 11.  - Increase Epi support to 10 mcg - CRRT now running +  - continue broad spectrum abx  - With multisystem organ failure (heart, liver, kidney) and delirium, his prognosis is increasingly poor.  He is not an LVAD candidate and or a transplant candidate at this point.  I would not place him on [place] ECMO as I do not think we have an end  point for him.  Will continue current management with CVVH trying to pull volume with pressor support and broad spectrum antibiotics.  After discussion with family, he is now DNR/DNI.  We will continue goals of care discussions, his wife wants to continue current level of care for the time being. Will continue current support as above, if he further deteriorates will need discussion of comfort care.    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 - underwent DC-CV on 12/10/23 - Maintaining SR/ST. Continue IV amio. Of note he has previously had difficulty taking amiodarone  due to nausea.  - Continue heparin   -  EP following. If turns around, can consider potential AFL ablation (versus AVN/pacer) + ICD.    VT - S/P DC-CV 10/4  - S/P DC- CV 10/6  - Continue amiodarone  drip  - Lidocaine  gtt stopped 10/9. Now on mexiletine.  - EP following.  - Has been electrically quiet since start of steroids  Possible Sarcoid - Consulted EP. - Now on prednisone  + methotrexate, methotrexate held though as it cannot be crushed.  - Brief NSVT overnight but no further sustained VT  AKI on CKD Stage IIIa  - Creatinine baseline ~ 1.6-1.8 - Oliguric, CRRT started 10/13 - CVP 11 today. Will pause pulling off CRRT given worsening hypotension -  Appreciate nephrology assistance   OSA  -Continue CPAP  Hyponatremia  - Hypervolemic hyponatremia - S/p 3% Q8 several days + Tolvaptan 10/13 - Now on CVVH for management.   AMS Elevated LFTs - Suspect component of hepatic encephalopathy with delirium.  - Ammonia 52>51>35  - On lactulose and rifaximin, think we can continue rifaximin at this point and make lactulose prn constipation given regular BMs.    ID - Immunosuppressed with steroids.  - With refractory hypotension, was started on empiric vancomycin + Zosyn. Abx day #7 - Last PCT was only 0.22.  - WBCs 29>25>26K but on steroids. Blood cultures NGTD.  - Continue broad spectrum abx for now   Thrombocytopenia -  Very slow trend down in platelets, discussed with pharmacy and think not likely HIT (due to inflammation/acute illness).  - unable to calculate on today's CBC d/t clumping  - oozing from internal jugular HD site but H/H stable.  Monitor closely    CRITICAL CARE Performed by: Caffie Shed   Total critical care time: 15 minutes  Critical care time was exclusive of separately billable procedures and treating other patients.  Critical care was necessary to treat or prevent imminent or life-threatening deterioration.  Critical care was time spent personally by me on the following activities: development of treatment plan with patient and/or surrogate as well as nursing, discussions with consultants, evaluation of patient's response to treatment, examination of patient, obtaining history from patient or surrogate, ordering and performing treatments and interventions, ordering and review of laboratory studies, ordering and review of radiographic studies, pulse oximetry and re-evaluation of patient's condition.  Caffie Shed, PA-C   Patient seen with PA, I formulated the plan and agree with the above note.   Worsening hypotension overnight, now running CVVH slightly positive.  He is on epinephrine 8, NE 60, vasopressin 0.04, dobutamine 7.5.  CVP now 14, I/Os net negative 1329 for the last day. Co-ox remains low at 46%, SVR around 1200.   He remains on vancomycin/Zosyn.    In NSR on amiodarone .   He is more lethargic today, wakes up some and will follow commands with stimulation.   General: NAD Neck: JVP 14 cm, no thyromegaly or thyroid  nodule.  Lungs: Decreased at bases.  CV: Nondisplaced PMI.  Heart regular S1/S2, no S3/S4, no murmur.  1+ edema to knees.   Abdomen: Soft, nontender, no hepatosplenomegaly, no distention.  Skin: Intact without lesions or rashes.  Neurologic: Drowsy/lethargic.  Extremities: No clubbing or cyanosis.  HEENT: Normal.   He is much less alert this  morning, MAP low.  Will wake up to follow commands.    WBCs lower at 24K.  He is immunosuppressed with steroids.  We are covering empirically with Zosyn and vancomycin, will continue this given worsening clinical situation this morning.    Multiple  episodes of VT initially, now on amiodarone  gtt 30 mg/hr + mexiletine.  Improved since started on steroids. He is in NSR.    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  Cardiac PET in 6/23 did not show active inflammation.  He is now being treated empirically with prednisone  30 mg daily + methotrexate 5 mg weekly (methotrexate now held as it cannot be crushed for NGT) ventricular rhythm improved. Will keep this going with empiric abx.    Cardiogenic shock with AKI.  Co-ox 46% this morning on dobutamine 7.5, epinephrine 8, NE 60, vasopressin 0.04.  MAP dropping today and co-ox consistently low.  CVVH started 10/13 with intractable volume overload, AKI, and intractable hyponatremia.  He is 1 lb down over the last day with CVVH.  CVP down to 14.  - With worsening pressor requirement, need to keep CVVH slightly positive for now.  - Increase dobutamine to 10 - Increase epinephrine to 10 - Will not use methylene blue with high SVR.      With multisystem organ failure (heart, liver, kidney) and delirium, his prognosis is increasingly poor.  He is not an LVAD candidate and or a transplant candidate at this point.  I would not place him on [place] ECMO as I do not think we have an end point for him. Pressor requirement increasing today and mental status worsening.  I do not think we have a good path forwards for him at this point.  Palliative care now following. Will need to discuss with family.   CRITICAL CARE Performed by: Ezra Shuck  Total critical care time: 45 minutes  Critical care time was exclusive of separately billable  procedures and treating other patients.  Critical care was necessary to treat or prevent imminent or life-threatening deterioration.  Critical care was time spent personally by me on the following activities: development of treatment plan with patient and/or surrogate as well as nursing, discussions with consultants, evaluation of patient's response to treatment, examination of patient, obtaining history from patient or surrogate, ordering and performing treatments and interventions, ordering and review of laboratory studies, ordering and review of radiographic studies, pulse oximetry and re-evaluation of patient's condition.   Ezra Shuck 12/29/2023 8:12 AM

## 2023-12-29 NOTE — Progress Notes (Signed)
   12/29/23 1700  Spiritual Encounters  Type of Visit Initial  Care provided to: Pt and family  Conversation partners present during encounter Nurse  Referral source Patient request;Nurse (RN/NT/LPN)  Reason for visit End-of-life  OnCall Visit Yes  Spiritual Framework  Presenting Themes Coping tools;Community and relationships  Community/Connection Family;Friend(s);Faith community  Family Stress Factors Health changes;Loss  Interventions  Spiritual Care Interventions Made Established relationship of care and support;Compassionate presence;Reflective listening;Normalization of emotions;Prayer  Intervention Outcomes  Outcomes Connection to spiritual care;Awareness of support   Chaplain paged to provide support the family. The patient's wife was at the bedside. She mentioned that they had been together for forty years and had three kids. She also said that they have many friends and family members who support each other.   Chaplain asked open ended question, listened attentively, offered a prayer and provided emotional support. The patient requested to see a Cathern. Chaplain called [facility], but couldn't reach anyone yet, left a voicemail. Chaplain will inform the patient once the [facility] contacts us .   M.Kubra Susanna Kerry Resident [phone]

## 2023-12-29 NOTE — Progress Notes (Signed)
 Received page that family was ready to transition to comfort care.  On chart review patient's family have spoken to Dr. Rolan and palliative care about possible transition once family arrived this evening and if condition did not improve.   I personally spoke with family including his wife Harold Park. She confirms would like to transition her husband to comfort care moving forward. All questions answered.  Comfort Care orders placed.   Leontine Salen PA-C

## 2023-12-29 NOTE — Progress Notes (Signed)
 Chart reviewed EP will stand by. Please recall if needed  Charlies Diante, PA-C

## 2023-12-29 NOTE — Procedures (Signed)
 Arterial Catheter Insertion Procedure Note  Wil Slape  [id number]  [birth year]  Date:12/29/23  Time:4:21 PM    Provider Performing: Valinda Novas    Procedure: Insertion of Arterial Line (63379) with US  guidance (23062)   Indication(s) Blood pressure monitoring and/or need for frequent ABGs  Consent Risks of the procedure as well as the alternatives and risks of each were explained to the patient and/or caregiver.  Consent for the procedure was obtained and is signed in the bedside chart  Anesthesia None   Time Out Verified patient identification, verified procedure, site/side was marked, verified correct patient position, special equipment/implants available, medications/allergies/relevant history reviewed, required imaging and test results available.   Sterile Technique Maximal sterile technique including full sterile barrier drape, hand hygiene, sterile gown, sterile gloves, mask, hair covering, sterile ultrasound probe cover (if used).   Procedure Description Area of catheter insertion was cleaned with chlorhexidine  and draped in sterile fashion. With real-time ultrasound guidance an arterial catheter was placed into the left Axillary artery.  Appropriate arterial tracings confirmed on monitor.     Complications/Tolerance None; patient tolerated the procedure well.   EBL Minimal   Specimen(s) None

## 2023-12-29 NOTE — TOC Progression Note (Signed)
 Transition of Care [facility]) - Progression Note    Patient Details  Name: Lam Mccubbins MRN: [id number] Date of Birth: 1965/06/06  [facility] CM/SW Contact  Justina Delcia Czar, RN Phone Number: [phone] 12/29/2023, 9:23 AM  Clinical Narrative:    Patient remains on CRRT, IV abx, amio gtt. PT/OT recommended IP rehab.  Palliative consulted for GOC.   Chart reviewed for discharge readiness, patient not medically stable for d/c. Inpatient CM/CSW will continue to monitor pt's advancement through interdisciplinary progression rounds.   If new pt transition needs arise, MD please place a TOC consult.    Expected Discharge Plan: Home/Self Care Barriers to Discharge: Continued Medical Work up    Expected Discharge Plan and Services   Discharge Planning Services: CM Consult   Living arrangements for the past 2 months: Single Family Home                                       Social Drivers of Health (SDOH) Interventions SDOH Screenings   Food Insecurity: No Food Insecurity (12/12/2023)  Housing: Low Risk  (12/12/2023)  Transportation Needs: No Transportation Needs (12/12/2023)  Utilities: Not At Risk (12/12/2023)  Depression (PHQ2-9): Medium Risk (03/06/2023)  Tobacco Use: Low Risk  (12/12/2023)    Readmission Risk Interventions     No data to display

## 2023-12-29 NOTE — Progress Notes (Addendum)
 Pharmacy Antibiotic Note  Harold Park is a 58 y.o. male admitted on 12/11/2023 with sepsis.  Pharmacy has been consulted for vancomycin dosing.  12/29/23: Vancomycin trough 18 mcg/mL, within goal trough range of 10-20 mcg/mL. 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 Patient remains on CRRT, afebrile. Still requiring high doses of pressors. Discussed with advanced heart failure MD, plan to continue antibiotics.   Plan: Continue vancomycin 1500 mg IV every 24 hours Continue zosyn 3.375g IV every 6 hours  Monitor CRRT plans, cx results, clinical picture, and for vancomycin levels as needed  Height: 6' 5 (195.6 cm) Weight: 126.7 kg (279 lb 5.2 oz) (with footboard) IBW/kg (Calculated) : 89.1  Temp (24hrs), Avg:98.1 F (36.7 C), Min:97.2 F (36.2 C), Max:98.6 F (37 C)  Recent Labs  Lab 12/25/23 0449 12/25/23 1531 12/26/23 0418 12/26/23 1009 12/26/23 1316 12/26/23 1547 12/27/23 0426 12/27/23 9092 12/27/23 1636 12/28/23 0432 12/28/23 0624 12/28/23 0823 12/28/23 1533 12/29/23 0351 12/29/23 0822  WBC 27.1*  --  29.1*  --   --   --  25.3*  --   --   --  26.7*  --   --  23.9*  --   CREATININE 1.83* 1.91* 2.00*  --   --    < > 2.10*  --  2.17* 2.48*  --   --  2.41* 2.08*  --   LATICACIDVEN  --  2.0*  --  1.7 2.4*  --   --  2.0*  --   --   --  2.3*  --   --   --   VANCOTROUGH  --   --   --   --   --   --   --   --   --   --   --   --   --   --  18   < > = values in this interval not displayed.    Estimated Creatinine Clearance: 56.3 mL/min (A) (by C-G formula based on SCr of 2.08 mg/dL (H)).    Allergies  Allergen Reactions   Bee Venom Anaphylaxis    Antimicrobials this admission: Vancomycin 10/15 >>  Zosyn 10/15 >>   Dose adjustments this admission: Vancomycin trough 12/29/23 0900: 18 mcg/mL - no dose adjustment  Microbiology results: 10/15 BCx: ngtd 10/10 Bcx: ngtd  10/4 MRSA PCR: neg 10/3  COVID/Flu/RSV PCR: neg  Thank you for allowing pharmacy to participate in this patient's care,  Morna Breach, PharmD PGY2 Cardiology Pharmacy Resident 12/29/2023 9:45 AM

## 2023-12-29 NOTE — Consult Note (Signed)
 Consultation Note Date: 12/29/2023   Patient Name: Harold Park  DOB: 1966/02/21  MRN: [id number]  Age / Sex: 58 y.o., male  PCP: Micheal [name], MD Referring Physician: [name], MD  Reason for Consultation: Establishing goals of care  HPI/Patient Profile: 58 y.o. male  with past medical history of HFrEF EF 30-35% (concern for sarcoid), VT, hypertension, atrial fibrillation s/p DCCV 10/2,  OSA on CPAP admitted on 12/11/2023 with shortness of breath, weight gain, abd distension due to acute on chronic heart failure and cardiogenic shock. 10/4 EF <15% and severe RV failure. Requiring significant inotrope/vasopressor support as well as CRRT.    Clinical Assessment and Goals of Care: Consult received and chart review completed. I met today along with Dr. Rolan with Harold Park and wife Harold Park at bedside. Harold Park is awake although poor reserve and does not always respond verbally to questions. He responds more to his wife at bedside. I introduced myself from palliative care for support through this difficult time. 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   Harold Park does tell me that some of the providers can be doom and gloom and that it can be confusing when Shota is actually waking up more and even working with PT yesterday. I explained that the significant support that he is receiving is helping him to be more awake and interactive. However, it is the ultimate path forward that is concerning or where we go from here. He is doing okay with this support right now but we are not sure how long this will last. We also discussed that if we tried to wean down some of this support I do not believe he will tolerate this well so he is reliant on significant support from CRRT as well as support from significant dosages of vasoactive infusions.   Harold Park shares that they have had  these conversations and they have elected DNR. We discussed that we will likely know the right time for consideration of transition to comfort care as there will likely be a time of further decline or decreased mentation in the near future. Harold Park expresses understanding. Harold Park does not express any concerns or needs when asked. We will speak more tomorrow unless they call for further needs in the meantime.   Harold Park shares that they have been together 40 years and married ~35 years. They have 3 children with the eldest and youngest still at home (youngest just started at [employer]) and the middle child just graduated with Master's degree in [place]  (they were unable to attend graduation due to current illness). Boe's mother also lives in the home. Harold Park's very independent and functional 56 year old mother will be coming from [place] tomorrow. Harold Park shares that although they do not have much family locally they have family support as well as a good community here locally.   I completed provider portion of FMLA for Harold Park.   All questions/concerns addressed. Emotional support provided.   Primary Decision Maker NEXT OF KIN wife (patient is present but  does not verbalize much during conversation)    SUMMARY OF RECOMMENDATIONS   - DNR - Continue current treatment - Open to consideration of transition to comfort care if further decline - Time for outcomes   Code Status/Advance Care Planning: DNR   Symptom Management:  Per heart failure, nephrology  Prognosis:  Overall prognosis very poor   Discharge Planning: To Be Determined      Primary Diagnoses: Present on Admission:  Acute on chronic systolic CHF (congestive heart failure) (HCC)  Depression  OSA (obstructive sleep apnea)  Essential hypertension  PAF (paroxysmal atrial fibrillation) (HCC)  Obesity, class 2  Sarcoidosis  Type 2 diabetes mellitus with cardiac complication (HCC)  CKD stage 3a, GFR 45-59 ml/min (HCC)   I  have reviewed the medical record, interviewed the patient and family, and examined the patient. The following aspects are pertinent.  Past Medical History:  Diagnosis Date   Chronic bronchitis    HTN (hypertension)    NICM (nonischemic cardiomyopathy) (HCC)    Obesity (BMI 30-39.9) 12/04/2015   OSA (obstructive sleep apnea) 08/19/2015   Severe with AHI 64/hr now on CPAP at 13cm H2O   PAF (paroxysmal atrial fibrillation) (HCC)    Systolic CHF, chronic (HCC)    Social History   Socioeconomic History   Marital status: Married    Spouse name: Not on file   Number of children: 3   Years of education: Not on file   Highest education level: Not on file  Occupational History   Occupation: realtor  Tobacco Use   Smoking status: Never   Smokeless tobacco: Never  Vaping Use   Vaping status: Never Used  Substance and Sexual Activity   Alcohol use: Yes    Comment: occasional   Drug use: Never   Sexual activity: Not on file  Other Topics Concern   Not on file  Social History Narrative   Not on file   Social Drivers of Health   Financial Resource Strain: Not on file  Food Insecurity: No Food Insecurity (12/12/2023)   Hunger Vital Sign    Worried About Running Out of Food in the Last Year: Never true    Ran Out of Food in the Last Year: Never true  Transportation Needs: No Transportation Needs (12/12/2023)   PRAPARE - Administrator, Civil Service (Medical): No    Lack of Transportation (Non-Medical): No  Physical Activity: Not on file  Stress: Not on file  Social Connections: Not on file   Family History  Problem Relation Age of Onset   Breast cancer Mother    Cancer Father        blood cancer   Hypertension Other    Heart disease Other    Stomach cancer Neg Hx    Colon cancer Neg Hx    Esophageal cancer Neg Hx    Pancreatic cancer Neg Hx    Rectal cancer Neg Hx    Scheduled Meds:  calcium  citrate  200 mg of elemental calcium  Per Tube Daily   Chlorhexidine   Gluconate Cloth  6 each Topical Daily   cholecalciferol  1,000 Units Per Tube Daily   dapsone  100 mg Per Tube Daily   feeding supplement  237 mL Oral TID BM   feeding supplement (PROSource TF20)  60 mL Per Tube Q4H   insulin  aspart  0-20 Units Subcutaneous Q4H   insulin  glargine-yfgn  20 Units Subcutaneous QHS   melatonin  5 mg Per Tube QHS   mexiletine  150 mg Per Tube Q8H   multivitamin  1 tablet Per Tube QHS   mouth rinse  15 mL Mouth Rinse 4 times per day   pantoprazole (PROTONIX) IV  40 mg Intravenous Q24H   predniSONE   30 mg Per Tube Q breakfast   rifaximin  550 mg Per Tube BID   sodium chloride  flush  10-40 mL Intracatheter Q12H   sodium chloride  flush  10-40 mL Intracatheter Q12H   sodium chloride  flush  3 mL Intravenous Q12H   Continuous Infusions:  sodium chloride  20 mL/hr at 12/29/23 0800   amiodarone  30 mg/hr (12/29/23 0800)   DOBUTamine 10 mcg/kg/min (12/29/23 0841)   epinephrine     epinephrine 10 mcg/min (12/29/23 0800)   feeding supplement (VITAL 1.5 CAL) 20 mL/hr at 12/29/23 0800   heparin  1,950 Units/hr (12/29/23 0800)   norepinephrine  (LEVOPHED ) Adult infusion 60 mcg/min (12/29/23 0848)   piperacillin-tazobactam     potassium PHOSPHATE IVPB (in mmol) 15 mmol (12/29/23 0813)   prismasol BGK 4/2.5 400 mL/hr at 12/28/23 2320   prismasol BGK 4/2.5 400 mL/hr at 12/28/23 2320   prismasol BGK 4/2.5 1,500 mL/hr at 12/29/23 9395   vancomycin 1,500 mg (12/29/23 0903)   vasopressin 0.04 Units/min (12/29/23 0800)   PRN Meds:.acetaminophen  **OR** acetaminophen , Gerhardt's butt cream, heparin , heparin , lactulose, mouth rinse, oxyCODONE , prochlorperazine, sodium chloride  flush, sodium chloride  flush, traZODone Allergies  Allergen Reactions   Bee Venom Anaphylaxis   Review of Systems  Unable to perform ROS: Acuity of condition    Physical Exam Vitals and nursing note reviewed.  Constitutional:      General: He is awake. He is not in acute distress.    Appearance:  He is ill-appearing.     Comments: + [place]  Cardiovascular:     Rate and Rhythm: Normal rate.  Pulmonary:     Effort: No tachypnea, accessory muscle usage or respiratory distress.  Abdominal:     Palpations: Abdomen is soft.  Neurological:     Mental Status: He is alert.     Comments: Awake and interacting. He responds intermittently to questions. Not very verbal but likely due to poor reserve. Responses more appropriate in interactions with wife. Unable to fully assess orientation due to limited verbal interaction.      Vital Signs: BP (!) 83/65 (BP Location: Left Leg)   Pulse 92   Temp (!) 97.2 F (36.2 C) (Axillary)   Resp (!) 29   Ht 6' 5 (1.956 m)   Wt 126.7 kg Comment: with footboard  SpO2 93%   BMI 33.12 kg/m  Pain Scale: 0-10 POSS *See Group Information*: 2-Acceptable,Slightly drowsy, easily aroused Pain Score: 0-No pain   SpO2: SpO2: 93 % O2 Device:SpO2: 93 % O2 Flow Rate: .O2 Flow Rate (L/min): 3 L/min  IO: Intake/output summary:  Intake/Output Summary (Last 24 hours) at 12/29/2023 0944 Last data filed at 12/29/2023 0800 Gross per 24 hour  Intake 6084 ml  Output 6981.8 ml  Net -897.8 ml    LBM: Last BM Date : 12/28/23 Baseline Weight: Weight: (!) 136.8 kg Most recent weight: Weight: 126.7 kg (with footboard)      Time Total: 80 min  Greater than 50%  of this time was spent counseling and coordinating care related to the above assessment and plan.  Signed by: Bernarda Kitty, NP Palliative Medicine Team Pager # [phone] (M-F 8a-5p) Team Phone # [phone] (Nights/Weekends)

## 2023-12-29 NOTE — Progress Notes (Signed)
  [facility] Progress Note    Assessment/ Plan:    AKI/CKD stage IIIa - in setting of biventricular failure and ongoing diuresis +/- IV contrast for heart cath on 12/15/23.  Did not respond well to maximal medical therapy. RIJ Temp HD cath placed 12/21/23 by PCCM and initiated CRRT to help manage his volume and hyponatremia.  All fluids 4K/2.5Ca, no heparin  since he is on systemic heparin  for cardiac issues.  UF goal net neg 100-150 mL/hr, will continue with current settings.  Pressor support to optimize volume status, CVP still high Avoid nephrotoxic medications including NSAIDs and iodinated intravenous contrast exposure unless the latter is absolutely indicated.  Preferred narcotic agents for pain control are hydromorphone, fentanyl , and methadone. Morphine should not be used. Avoid Baclofen and avoid oral sodium phosphate  and magnesium citrate based laxatives / bowel preps. Continue strict Input and Output monitoring. Will monitor the patient closely with you and intervene or adjust therapy as indicated by changes in clinical status/labs  Acute on chronic biventricular heart failure - normal cardiac cath on 12/15/23.  CVP remains elevated.  Currently, dobutamine, levophed , vasopressin, and epi.  Off lasix  gtt given diminishing urine output  GDMT limited by hypotension.  Epi added to drips per advanced heart failure team. 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 Sp pulse steroids, now on methotrexate and prednisone .  Plan per advanced heart failure team. Persistent atrial fibrillation - despite ablation and DCCV.  Currently on IV amiodarone  and heparin  gtt.  EP following. H/o V tach - lidocaine  gtt stopped 12/17/23.  Now on mexiletine.  EP following. OSA - continue with CPAP Hypervolemic hyponatremia - also with AKI.  Improved with CRRT/UF. Thrombocytopenia: per primary service Disposition - prognosis is guarded as he has not really improved since starting CRRT.   Now DNR. GOC discussions per primary service  Discussed with Dr. Rolan and ICU RN.  Subjective:   Patient seen and examined on CRRT. Currently running well on CRRT. CVP relatively unchanged, increased pressor requirements overnight therefore really not able to UF. Uop ~35cc. Patient denies any chest pain, SOB when prompted Pressors: dobut, epi, NE, vaso Net neg ~1.3L   Objective:   BP (!) 83/54 (BP Location: Left Leg)   Pulse 89   Temp 98.2 F (36.8 C) (Axillary)   Resp (!) 26   Ht 6' 5 (1.956 m)   Wt 126.7 kg Comment: with footboard  SpO2 92%   BMI 33.12 kg/m   Intake/Output Summary (Last 24 hours) at 12/29/2023 0744 Last data filed at 12/29/2023 0700 Gross per 24 hour  Intake 6352.61 ml  Output 7681.7 ml  Net -1329.09 ml   Weight change: -0.7 kg  Physical Exam: Gen: ill appearing, NAD CVS: RRR Resp: diminished air entry bibasilar, no w/r/r/c appreciated Abd: obese, soft Ext: dusky toes, trace to 1+ edema b/l LEs Neuro: awake, following some commands Dialysis access: RIJ temp HD catheter in use  Imaging: No results found.  Labs: BMET Recent Labs  Lab 12/26/23 0418 12/26/23 1547 12/27/23 0426 12/27/23 1636 12/28/23 0432 12/28/23 1533 12/29/23 0351  NA 134* 133* 134* 134* 137 130* 134*  K 4.3 4.4 4.3 3.8 3.2* 4.5 3.6  CL 102 102 97* 91* 78* 96* 102  CO2 19* 21* 21* 19* 16* 19* 18*  GLUCOSE 97 178* 190* 177* 227* 145* 198*  BUN 41* 45* 45* 43* 41* 48* 47*  CREATININE 2.00* 1.96* 2.10* 2.17* 2.48* 2.41* 2.08*  CALCIUM  8.3* 7.8* 7.9* 7.1* 5.6*  7.7* 6.4*  PHOS 3.0 3.1 3.6 3.1 3.0 3.3 2.6   CBC Recent Labs  Lab 12/26/23 0418 12/27/23 0426 12/28/23 0624 12/29/23 0351  WBC 29.1* 25.3* 26.7* 23.9*  HGB 16.9 16.8 16.4 14.7  HCT 49.0 50.7 49.7 43.8  MCV 84.6 87.3 86.6 86.6  PLT 101* 94* 86* PLATELET CLUMPS NOTED ON SMEAR, UNABLE TO ESTIMATE    Medications:     calcium  citrate  200 mg of elemental calcium  Per Tube Daily   Chlorhexidine  Gluconate  Cloth  6 each Topical Daily   cholecalciferol  1,000 Units Per Tube Daily   dapsone  100 mg Per Tube Daily   feeding supplement  237 mL Oral TID BM   feeding supplement (PROSource TF20)  60 mL Per Tube Q4H   insulin  aspart  0-20 Units Subcutaneous Q4H   insulin  glargine-yfgn  20 Units Subcutaneous QHS   melatonin  5 mg Per Tube QHS   mexiletine  150 mg Per Tube Q8H   multivitamin  1 tablet Per Tube QHS   mouth rinse  15 mL Mouth Rinse 4 times per day   pantoprazole (PROTONIX) IV  40 mg Intravenous Q24H   predniSONE   30 mg Per Tube Q breakfast   rifaximin  550 mg Per Tube BID   sodium chloride  flush  10-40 mL Intracatheter Q12H   sodium chloride  flush  10-40 mL Intracatheter Q12H   sodium chloride  flush  3 mL Intravenous Q12H      Ephriam Stank, MD [facility] 12/29/2023, 7:44 AM

## 2023-12-29 NOTE — Progress Notes (Signed)
 PHARMACY - ANTICOAGULATION CONSULT NOTE  Pharmacy Consult for heparin  Indication: atrial fibrillation  Allergies  Allergen Reactions   Bee Venom Anaphylaxis    Patient Measurements: Height: 6' 5 (195.6 cm) Weight: 126.7 kg (279 lb 5.2 oz) (with footboard) IBW/kg (Calculated) : 89.1 HEPARIN  DW (KG): 119  Vital Signs: Temp: 97.6 F (36.4 C) (10/21 1152) Temp Source: Axillary (10/21 1152) BP: 64/56 (10/21 1445) Pulse Rate: 86 (10/21 1445)  Labs: Recent Labs    12/27/23 0426 12/27/23 1636 12/28/23 0432 12/28/23 0624 12/28/23 1403 12/28/23 1533 12/29/23 0213 12/29/23 0351 12/29/23 0837 12/29/23 1400  HGB 16.8  --   --  16.4  --   --   --  14.7 18.4*  --   HCT 50.7  --   --  49.7  --   --   --  43.8 54.0*  --   PLT 94*  --   --  86*  --   --   --  PLATELET CLUMPS NOTED ON SMEAR, UNABLE TO ESTIMATE  --   --   HEPARINUNFRC 0.37  --  0.28*  --    < > 0.28* 0.44  --   --  0.19*  CREATININE 2.10*   < > 2.48*  --   --  2.41*  --  2.08*  --   --    < > = values in this interval not displayed.    Estimated Creatinine Clearance: 56.3 mL/min (A) (by C-G formula based on SCr of 2.08 mg/dL (H)).   Medications:   Infusions:   sodium chloride  20 mL/hr at 12/29/23 1400   amiodarone  30 mg/hr (12/29/23 1400)   DOBUTamine 10 mcg/kg/min (12/29/23 1400)   epinephrine 10 mcg/min (12/29/23 1400)   epinephrine Stopped (12/29/23 1306)   feeding supplement (VITAL 1.5 CAL) 20 mL/hr at 12/29/23 1400   heparin  1,950 Units/hr (12/29/23 1400)   methylene blue (antidote) 120 mg in dextrose  5 % 50 mL IVPB     norepinephrine  (LEVOPHED ) Adult infusion 60 mcg/min (12/29/23 1400)   piperacillin-tazobactam Stopped (12/29/23 1136)   prismasol BGK 4/2.5 400 mL/hr at 12/29/23 1140   prismasol BGK 4/2.5 400 mL/hr at 12/29/23 1141   prismasol BGK 4/2.5 1,500 mL/hr at 12/29/23 1242   vancomycin Stopped (12/29/23 1103)   vasopressin 0.04 Units/min (12/29/23 1400)    Assessment: 58 YOM admitted  with atrial fibrillation with rapid ventricular response. Patient underwent DCCV on 12/10/23 prior to this admission. Patient was shocked on 12/12/23 PM, now in NSR. Patient on Eliquis  prior to hospitalization, last dose on 12/11/23. Plan to start heparin  infusion prior to possible procedures. Pharmacy consulted for heparin  dosing.   Jan 24, 2024 PM: Heparin  level 0.19, subtherapeutic on heparin  1950 units/hr. No issues with infusion running or signs of bleeding noted. CBC stable (Hgb 16.4, PLT 86).   Goal of Therapy:  Heparin  level 0.3-0.7 units/ml Monitor platelets by anticoagulation protocol: Yes   Plan:  Increase heparin  to 2100 units/hr Heparin  level in 8 hours Monitor heparin  level, CBC, and s/sx of bleeding daily  Morna Breach, PharmD PGY2 Cardiology Pharmacy Resident 12/29/2023 2:52 PM

## 2023-12-29 NOTE — Progress Notes (Signed)
 PHARMACY - ANTICOAGULATION CONSULT NOTE  Pharmacy Consult for heparin  Indication: atrial fibrillation  Allergies  Allergen Reactions   Bee Venom Anaphylaxis    Patient Measurements: Height: 6' 5 (195.6 cm) Weight: 127.4 kg (280 lb 13.9 oz) (with footboard) IBW/kg (Calculated) : 89.1 HEPARIN  DW (KG): 119  Vital Signs: Temp: 98.2 F (36.8 C) (10/21 0307) Temp Source: Axillary (10/21 0307) Pulse Rate: 91 (10/21 0300)  Labs: Recent Labs    12/26/23 0418 12/26/23 1316 12/27/23 0426 12/27/23 1636 12/28/23 0432 12/28/23 0624 12/28/23 1403 12/28/23 1533 12/29/23 0213  HGB 16.9  --  16.8  --   --  16.4  --   --   --   HCT 49.0  --  50.7  --   --  49.7  --   --   --   PLT 101*  --  94*  --   --  86*  --   --   --   HEPARINUNFRC 0.22*   < > 0.37  --  0.28*  --  <0.10* 0.28* 0.44  CREATININE 2.00*   < > 2.10* 2.17* 2.48*  --   --  2.41*  --    < > = values in this interval not displayed.    Estimated Creatinine Clearance: 48.7 mL/min (A) (by C-G formula based on SCr of 2.41 mg/dL (H)).   Medications:   Infusions:   sodium chloride  20 mL/hr at 12/29/23 0300   amiodarone  30 mg/hr (12/29/23 0300)   DOBUTamine 7.5 mcg/kg/min (12/29/23 0300)   epinephrine 8 mcg/min (12/29/23 0300)   feeding supplement (VITAL 1.5 CAL) 20 mL/hr at 12/29/23 0300   heparin  1,950 Units/hr (12/29/23 0300)   norepinephrine  (LEVOPHED ) Adult infusion 60 mcg/min (12/29/23 0300)   piperacillin-tazobactam Stopped (12/28/23 2136)   prismasol BGK 4/2.5 400 mL/hr at 12/28/23 2320   prismasol BGK 4/2.5 400 mL/hr at 12/28/23 2320   prismasol BGK 4/2.5 1,500 mL/hr at 12/29/23 0243   vancomycin Stopped (12/28/23 1253)   vasopressin 0.04 Units/min (12/29/23 0300)    Assessment: 58 YOM admitted with atrial fibrillation with rapid ventricular response. Patient underwent DCCV on 12/10/23 prior to this admission. Patient was shocked on 12/12/23 PM, now in NSR. Patient on Eliquis  prior to hospitalization, last  dose on 12/11/23. Plan to start heparin  infusion prior to possible procedures. Pharmacy consulted for heparin  dosing.   12/28/23 AM: Heparin  level 0.28, subtherapeutic at heparin  1700 units/hr. No issues with infusion running or signs of bleeding per RN. Hgb stable at 16.4, PLT decreased to 86.  10/21 AM update:  Heparin  level therapeutic after rate increase  Goal of Therapy:  Heparin  level 0.3-0.7 units/ml Monitor platelets by anticoagulation protocol: Yes   Plan:  Cont heparin  1950 units/hr Heparin  level in 8 hours  Lynwood Mckusick, PharmD, BCPS Clinical Pharmacist Phone: [phone]

## 2023-12-29 NOTE — Progress Notes (Signed)
 Pt BP continues to decrease. Unreliable a-line and cuff pressures. Doppler pressure obtained= 48. Rolan MD notified. A-line replaced by Harold, MD. New A-line reading confirms BP 60s/40s with MAP in the 40s. Levophed  maxed at 60 mcg/min. Epi at set dose of 10 mcg/min. Dobutamine at set dose of 10 mcg/kg/min. Vaso at set dose of 0.04 units/min. Methylene blue given. Membrane pressures increased on CRRT filter. Blood returned. Simmons PA notified and advised not to restart CRRT at this time. McLean MD discussed patient's worsening condition with patient's spouse and advised her to call children in as patient may not make it through the night. Visitation opened up to allow children and wife at bedside. No new orders at this time. Pt remains DNR.

## 2024-01-04 ENCOUNTER — Telehealth: Payer: Self-pay | Admitting: Cardiology

## 2024-01-04 NOTE — Telephone Encounter (Signed)
 Norleen Kung with [employer] calling about a death geologist, engineering. He has called and spoke with different people from [facility] and would like a callback for clarity. Please advise.    Norleen can be reached at  [phone].

## 2024-01-07 ENCOUNTER — Ambulatory Visit (HOSPITAL_COMMUNITY): Admission: RE | Admit: 2024-01-07 | Source: Ambulatory Visit | Admitting: Internal Medicine

## 2024-01-09 NOTE — Progress Notes (Signed)
 TOD [id number] pronounced by Adolphus, RN and Iris, RN.  No heart or lung sounds auscultated for 2 mins by each RN.  Md notified and aware

## 2024-01-09 NOTE — Death Summary Note (Signed)
 Advanced Heart Failure Death Summary  Death Summary   Patient ID: Harold Park MRN: [id number], DOB/AGE: 1965-10-11 58 y.o. 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   F/u Echo 12/24 no improvement despite maintenance of NSR, EF 25-30%, RV ok.   cMRI 2/25 EF 28 % RV 33% LGE pattern concerning for sarcoid. Seen by EP 4/25 to discussed ICD but pt declined.    Recently discovered to have recurrent ALF at EP f/u visit 9/25. Amiodarone  was increased and he was arranged for outpatient DCCV, done on 12/10/23 and converted back to NSR.   Hospital Course:   After discharge from the procedure, he started feeling worse w/ HF symptoms and reported 10 lb wt gain. Presented to the ED on 12/11/23 and found to be in low output HF w/ AKI on CKD. SCr up to 1.8 (from 1.4). Lactate up to 2.1. ECG with sinus tachycardia. Repeat TTE  EF < 10%, severe biatrial dilation, and severely dilated LV. He was admitted and placed on milrinone  and IV Lasix . On 10/4 developed sustained VT requiring emergent cardioversion. Transitioned to amiodarone  and lidocaine  gtts. Both AHF and EP teams consulted. Subsequent R/LHC on 10/7 demonstrated no CAD, severely elevated  biventricular filling pressures and severely reduced RV function given PAPi <1, elevated RA/PCWP ratio. Milrinone  increased and continued w/ diuresis.  Given high concern for cardiac sarcoid he was started empirically on steroids and methotrexate. He had no further VT but c/w worsening shock w/ development of distributive shock component. He was empirically treated w/ broad spectrum abx. No infectious source identified. BCx negative. He continued w/ refractory cardiogenic/distributive shock and development of MSOF including shock liver and anuric renal failure requiring CRRT, despite high dose inotropic and pressor support w/ EPI, NE, DBA and VP. Unfortunately not a candidate for LVAD nor transplant. Decision made not to place on [place] ECMO given no good end-point. After GOC discussion w/ palliative care, pt was made DNR/DNI. He continued to deteriorate despite max efforts. On 10/21, family made decision to transition to comfort care. Comfort care orders placed. Inotrope/ pressors discontinued. Pt passed Jan 29, 2024. TOD 7:12 AM.     Significant Diagnostic Studies  2D Echo 12/12/23 1. No LV thrombus by Definity . Left ventricular ejection fraction, by  estimation, is <10%. The left ventricle has severely decreased function.  The left ventricle has no regional wall motion abnormalities. The left  ventricular internal cavity size was  moderately to severely dilated. There is mild left ventricular  hypertrophy. Left ventricular diastolic parameters are indeterminate.   2. Right ventricular systolic function is severely reduced. The right  ventricular size is moderately enlarged. There is normal pulmonary artery  systolic pressure.   3. Left atrial size was severely dilated.   4. Right atrial size was mild to moderately dilated.   5. The mitral valve is grossly normal. Mild mitral  valve regurgitation.  No evidence of mitral stenosis.   6. The aortic valve was not well visualized. Aortic valve regurgitation  is  not visualized. No aortic stenosis is present.   7. The inferior vena cava is dilated in size with <50% respiratory  variability, suggesting right atrial pressure of 15 mmHg.   8. Evidence of atrial level shunting detected by color flow Doppler.   R/LHC 12/15/23 HEMODYNAMICS: RA:       20 mmHg (mean) RV:       50/14, 20 mmHg PA:       50/32 mmHg (38 mean) PCWP: 32 mmHg (mean)                                      Estimated Fick CO/CI   5.95L/min, 2.24L/min/m2       Thermodilution CO/CI   4.5L/min, 1.7L/min/m2       IMPRESSION: Sluggish coronary flow likely due to catheter size and severely elevated LVEDP. Otherwise no atherosclerotic disease. Severely elevated biventricular filling pressures Severely reduced RV function given PAPi <1, elevated RA/PCWP ratio Cath done on 0.125 of milrinone    Consultations  Advanced HF Electrophysiology  Pulmonary and Critical Care Medicine Nephrology  Palliative Care   Duration of Discharge Encounter: Greater than 35 minutes   Signed, Caffie Shed, PA-C January 20, 2024, 7:52 AM

## 2024-01-09 NOTE — Plan of Care (Signed)
 Patient comfort measures only. Family at bedside. No questions or concerns at this time. Goal to keep patient as comfortable as possible. Problem: Coping: Goal: Level of anxiety will decrease Outcome: Progressing   Problem: Pain Managment: Goal: General experience of comfort will improve and/or be controlled Outcome: Progressing

## 2024-01-09 DEATH — deceased

## 2024-01-18 ENCOUNTER — Ambulatory Visit (HOSPITAL_COMMUNITY): Admit: 2024-01-18 | Admitting: Cardiovascular Disease

## 2024-01-18 ENCOUNTER — Encounter (HOSPITAL_COMMUNITY): Payer: Self-pay

## 2024-01-18 SURGERY — A-FLUTTER ABLATION
Anesthesia: General

## 2024-02-13 ENCOUNTER — Other Ambulatory Visit (HOSPITAL_COMMUNITY): Payer: Self-pay | Admitting: Internal Medicine

## 2024-02-13 DIAGNOSIS — R350 Frequency of micturition: Secondary | ICD-10-CM

## 2024-02-17 ENCOUNTER — Ambulatory Visit (HOSPITAL_COMMUNITY)
